# Patient Record
Sex: Male | Born: 1970
Health system: Southern US, Community
[De-identification: ages and names within clinical notes are randomized; demographics above are authoritative.]

## PROBLEM LIST (undated history)

## (undated) DIAGNOSIS — I639 Cerebral infarction, unspecified: Secondary | ICD-10-CM

## (undated) DIAGNOSIS — I10 Essential (primary) hypertension: Secondary | ICD-10-CM

## (undated) DIAGNOSIS — E119 Type 2 diabetes mellitus without complications: Secondary | ICD-10-CM

## (undated) DIAGNOSIS — N189 Chronic kidney disease, unspecified: Secondary | ICD-10-CM

## (undated) DIAGNOSIS — K219 Gastro-esophageal reflux disease without esophagitis: Secondary | ICD-10-CM

## (undated) DIAGNOSIS — D649 Anemia, unspecified: Secondary | ICD-10-CM

## (undated) DIAGNOSIS — G4730 Sleep apnea, unspecified: Secondary | ICD-10-CM

## (undated) HISTORY — DX: Sleep apnea, unspecified: G47.30

## (undated) HISTORY — DX: Anemia, unspecified: D64.9

## (undated) HISTORY — DX: Gastro-esophageal reflux disease without esophagitis: K21.9

## (undated) HISTORY — DX: Cerebral infarction, unspecified: I63.9

## (undated) HISTORY — DX: Chronic kidney disease, unspecified: N18.9

## (undated) NOTE — *Deleted (*Deleted)
Pharmacy Student Note: For Learning Purposes ONLY. Not an active part of the chart.   Chief Complaint:  Presented with worsening bilateral lower extremity swelling x 1 month, worsening.    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   Admit Complaint:  Anticoagulation -nonambulating (wheelchair bound) > Lovenox  30 mg Q24HR currently  Infectious Disease -WBC wnl  Cardiovascular Possible CHF -CXR notable for cardiomegaly and small bilateral pleural effusions > suggests CHF -ECHO 60-65%, LDL 599 (04/2020), BNP wnl -BNP, and ECHO make CHF less likely -repeat ECHO HTN -BP on admit 165/86 PTA: amlodipine 10 mg daily, carvedilol 12.5 mg BID, hydralazine 25 mg TID Tx: continue home meds Hx CVA 04/2020 Wheelchair bound (nonambulating) Tx: ctoniue PTA  Endocrinology: T2DM -BG 179 on admit -compliant to meds Last A1c 9.6 -PTA: glipizide 5 mg BID -Tx: sSSI, CBGs  Gastrointestinal / Nutrition: GERD -PTA omeprazole 40 mg daily -Tx: continue PTA   Neurology   Nephrology: nephrotic syndrome s/p CKD stage 4 and T2DM Nephrotic Syndrome Albumin low (2.0) > worsening today to 1.7 Protein present in urine (> 300) Thrombocytosis (plts 499) LDL, low albumin and protein lead to nephrotic syndrome Tx: sodium restrict (less than 2g/day), BMP, daily wts, strict I/O, IV lasix CKD Stage 4 Scr 3.42 (BL 3-3.1) > 3.49 (11/8) > 3.44 11/9 CrCl=25.4 GFR 21 BUN 31 > 34 UOP 4 L  BUN/SCr= 9 Tx: UA, diuresis, BMP, I/O    On lasix 120 mg over 60 minutes > according to nephro, responded well to      diuretics  Pulmonary   Hematology / Oncology: normocytic anemia Hgb 9.7 > 9.3 >8.8 MCV Plt 469 (high) > likely chronic  Fe 40 - ok TSAT 21 - borderline  but ok      PTA Medication Issues Senna-docusate not taking APAP 325 mg 2 tabs Q4HR mild pain Amlodipine 10 mg daily Aspirin 81 mg daily Atorvastatin 80 mg daily Carvedilol 12.5 mg BID Glipizide 5 mg BID Hydralazine 25 mg TID omperazole 40 mg daily PEG 17 g Sodium bicarbonate 650 mg 2 tabs daily VitD 50,000 unit Q7 days      Best Practices      Glean Salvo, Festus Aloe PharmD Student

---

## 2018-03-26 ENCOUNTER — Ambulatory Visit (INDEPENDENT_AMBULATORY_CARE_PROVIDER_SITE_OTHER): Payer: BLUE CROSS/BLUE SHIELD | Admitting: Emergency Medicine

## 2018-03-26 ENCOUNTER — Encounter: Payer: Self-pay | Admitting: Emergency Medicine

## 2018-03-26 ENCOUNTER — Other Ambulatory Visit: Payer: Self-pay

## 2018-03-26 VITALS — BP 122/82 | HR 93 | Temp 98.6°F | Resp 16 | Ht 64.25 in | Wt 149.6 lb

## 2018-03-26 DIAGNOSIS — R1010 Upper abdominal pain, unspecified: Secondary | ICD-10-CM | POA: Insufficient documentation

## 2018-03-26 NOTE — Patient Instructions (Addendum)
     If you have lab work done today you will be contacted with your lab results within the next 2 weeks.  If you have not heard from [place] then please contact us. The fastest way to get your results is to register for My Chart.   IF you received an x-ray today, you will receive an invoice from [facility]. Please contact [facility] at [phone] with questions or concerns regarding your invoice.   IF you received labwork today, you will receive an invoice from [facility]. Please contact [facility] at [phone] with questions or concerns regarding your invoice.   Our billing staff will not be able to assist you with questions regarding bills from these companies.  You will be contacted with the lab results as soon as they are available. The fastest way to get your results is to activate your My Chart account. Instructions are located on the last page of this paperwork. If you have not heard from [place] regarding the results in 2 weeks, please contact this office.     Abdominal Pain, Adult Many things can cause belly (abdominal) pain. Most times, belly pain is not dangerous. Many cases of belly pain can be watched and treated at home. Sometimes belly pain is serious, though. Your doctor will try to find the cause of your belly pain. Follow these instructions at home:  Take over-the-counter and prescription medicines only as told by your doctor. Do not take medicines that help you poop (laxatives) unless told to by your doctor.  Drink enough fluid to keep your pee (urine) clear or pale yellow.  Watch your belly pain for any changes.  Keep all follow-up visits as told by your doctor. This is important. Contact a doctor if:  Your belly pain changes or gets worse.  You are not hungry, or you lose weight without trying.  You are having trouble pooping (constipated) or have watery poop (diarrhea) for more than 2-3 days.  You have pain when you pee or poop.  Your belly pain  wakes you up at night.  Your pain gets worse with meals, after eating, or with certain foods.  You are throwing up and cannot keep anything down.  You have a fever. Get help right away if:  Your pain does not go away as soon as your doctor says it should.  You cannot stop throwing up.  Your pain is only in areas of your belly, such as the right side or the left lower part of the belly.  You have bloody or black poop, or poop that looks like tar.  You have very bad pain, cramping, or bloating in your belly.  You have signs of not having enough fluid or water in your body (dehydration), such as: ? Dark pee, very little pee, or no pee. ? Cracked lips. ? Dry mouth. ? Sunken eyes. ? Sleepiness. ? Weakness. This information is not intended to replace advice given to you by your health care provider. Make sure you discuss any questions you have with your health care provider. Document Released: 01/09/2008 Document Revised: 02/10/2016 Document Reviewed: 01/04/2016 Elsevier Interactive Patient Education  2018 [employer].

## 2018-03-26 NOTE — Progress Notes (Addendum)
Tony Schmitt 47 y.o.   Chief Complaint  Patient presents with  . Establish Care  . Abdominal Pain    x 3 days with vomiting    HISTORY OF PRESENT ILLNESS: This is a 47 y.o. male complaining of upper abdominal pain that started 3 days ago.  Doing better today.  Initially had some vomiting but none yesterday or today.  No longer nauseous.  Was able to eat and drink fine this morning.  Denies diarrhea.  Non-smoker.  Occasional beer drinker.  Had [place] pasta food with beer and soda last Saturday evening prior to developing symptoms Sunday morning.  Doing much better now.  No other associated symptoms.  Abdominal Pain  This is a new problem. The current episode started in the past 7 days. The onset quality is sudden. The problem has been gradually improving. The pain is located in the epigastric region. The pain is at a severity of 5/10. The pain is moderate. The quality of the pain is burning. The abdominal pain does not radiate. Associated symptoms include nausea and vomiting. Pertinent negatives include no anorexia, constipation, diarrhea, dysuria, fever, frequency, headaches, hematochezia, hematuria, melena, myalgias or weight loss. Nothing aggravates the pain. The pain is relieved by nothing. He has tried antacids for the symptoms. The treatment provided mild relief. There is no history of abdominal surgery, colon cancer, Crohn's disease, gallstones, GERD, pancreatitis, PUD or ulcerative colitis.     Prior to Admission medications   Not on File    Not on File  There are no active problems to display for this patient.   No past medical history on file.    Social History   Socioeconomic History  . Marital status: Single    Spouse name: Not on file  . Number of children: Not on file  . Years of education: Not on file  . Highest education level: Not on file  Occupational History  . Not on file  Social Needs  . Financial resource strain: Not on file  . Food insecurity:   Worry: Not on file    Inability: Not on file  . Transportation needs:    Medical: Not on file    Non-medical: Not on file  Tobacco Use  . Smoking status: Former Smoker    Types: Cigarettes, Cigars  . Smokeless tobacco: Never Used  Substance and Sexual Activity  . Alcohol use: Yes    Comment: beer 12 pack weekend  . Drug use: Never  . Sexual activity: Not on file  Lifestyle  . Physical activity:    Days per week: Not on file    Minutes per session: Not on file  . Stress: Not on file  Relationships  . Social connections:    Talks on phone: Not on file    Gets together: Not on file    Attends religious service: Not on file    Active member of club or organization: Not on file    Attends meetings of clubs or organizations: Not on file    Relationship status: Not on file  . Intimate partner violence:    Fear of current or ex partner: Not on file    Emotionally abused: Not on file    Physically abused: Not on file    Forced sexual activity: Not on file  Other Topics Concern  . Not on file  Social History Narrative  . Not on file    No family history on file.   Review of Systems  Constitutional: Negative.  Negative for chills, fever, malaise/fatigue and weight loss.  HENT: Negative.  Negative for sore throat.   Eyes: Negative.   Respiratory: Negative.  Negative for cough and shortness of breath.   Cardiovascular: Negative.  Negative for chest pain, palpitations and leg swelling.  Gastrointestinal: Positive for abdominal pain, nausea and vomiting. Negative for anorexia, constipation, diarrhea, heartburn, hematochezia and melena.  Genitourinary: Negative for dysuria, frequency and hematuria.  Musculoskeletal: Negative for myalgias.  Skin: Negative.  Negative for rash.  Neurological: Negative.  Negative for dizziness, focal weakness, loss of consciousness and headaches.  Endo/Heme/Allergies: Negative.   All other systems reviewed and are negative.   Vitals:   03/26/18  1034  BP: 122/82  Pulse: 93  Resp: 16  Temp: 98.6 F (37 C)  SpO2: 98%    Physical Exam  Constitutional: He is oriented to person, place, and time. He appears well-developed and well-nourished.  HENT:  Head: Normocephalic.  Right Ear: External ear normal.  Left Ear: External ear normal.  Nose: Nose normal.  Mouth/Throat: Oropharynx is clear and moist.  Eyes: Pupils are equal, round, and reactive to light. Conjunctivae and EOM are normal.  Neck: Normal range of motion. Neck supple. No thyromegaly present.  Cardiovascular: Normal rate, regular rhythm and normal heart sounds.  Pulmonary/Chest: Effort normal and breath sounds normal.  Abdominal: Soft. Bowel sounds are normal. He exhibits no distension and no mass. There is no tenderness. There is no rebound and no guarding. No hernia.  Musculoskeletal: Normal range of motion. He exhibits no edema.  Lymphadenopathy:    He has no cervical adenopathy.  Neurological: He is alert and oriented to person, place, and time. No sensory deficit. He exhibits normal muscle tone.  Skin: Skin is warm and dry. Capillary refill takes less than 2 seconds. No rash noted.  Psychiatric: He has a normal mood and affect. His behavior is normal.  Vitals reviewed.  Upper abdominal pain Clinically improved.  No red flag signs or symptoms.  Able to eat and drink.  No more vomiting.  No fever or chills.  Benign abdomen on examination.  Blood work done today.  Will monitor symptoms.  Follow-up as needed.    ASSESSMENT & PLAN: Tony Schmitt was seen today for establish care and abdominal pain.  Diagnoses and all orders for this visit:  Upper abdominal pain -     CBC with Differential/Platelet -     Comprehensive metabolic panel -     Lipase    Patient Instructions       If you have lab work done today you will be contacted with your lab results within the next 2 weeks.  If you have not heard from [place] then please contact us. The fastest way to get your  results is to register for My Chart.   IF you received an x-ray today, you will receive an invoice from [facility]. Please contact [facility] at [phone] with questions or concerns regarding your invoice.   IF you received labwork today, you will receive an invoice from [facility]. Please contact [facility] at [phone] with questions or concerns regarding your invoice.   Our billing staff will not be able to assist you with questions regarding bills from these companies.  You will be contacted with the lab results as soon as they are available. The fastest way to get your results is to activate your My Chart account. Instructions are located on the last page of this paperwork. If you have not  heard from [place] regarding the results in 2 weeks, please contact this office.     Abdominal Pain, Adult Many things can cause belly (abdominal) pain. Most times, belly pain is not dangerous. Many cases of belly pain can be watched and treated at home. Sometimes belly pain is serious, though. Your doctor will try to find the cause of your belly pain. Follow these instructions at home:  Take over-the-counter and prescription medicines only as told by your doctor. Do not take medicines that help you poop (laxatives) unless told to by your doctor.  Drink enough fluid to keep your pee (urine) clear or pale yellow.  Watch your belly pain for any changes.  Keep all follow-up visits as told by your doctor. This is important. Contact a doctor if:  Your belly pain changes or gets worse.  You are not hungry, or you lose weight without trying.  You are having trouble pooping (constipated) or have watery poop (diarrhea) for more than 2-3 days.  You have pain when you pee or poop.  Your belly pain wakes you up at night.  Your pain gets worse with meals, after eating, or with certain foods.  You are throwing up and cannot keep anything down.  You have a fever. Get help right away  if:  Your pain does not go away as soon as your doctor says it should.  You cannot stop throwing up.  Your pain is only in areas of your belly, such as the right side or the left lower part of the belly.  You have bloody or black poop, or poop that looks like tar.  You have very bad pain, cramping, or bloating in your belly.  You have signs of not having enough fluid or water in your body (dehydration), such as: ? Dark pee, very little pee, or no pee. ? Cracked lips. ? Dry mouth. ? Sunken eyes. ? Sleepiness. ? Weakness. This information is not intended to replace advice given to you by your health care provider. Make sure you discuss any questions you have with your health care provider. Document Released: 01/09/2008 Document Revised: 02/10/2016 Document Reviewed: 01/04/2016 Elsevier Interactive Patient Education  2018 [employer]      Agustina Caroli, MD [facility]

## 2018-03-26 NOTE — Assessment & Plan Note (Signed)
Clinically improved.  No red flag signs or symptoms.  Able to eat and drink.  No more vomiting.  No fever or chills.  Benign abdomen on examination.  Blood work done today.  Will monitor symptoms.  Follow-up as needed.

## 2018-03-27 ENCOUNTER — Ambulatory Visit (INDEPENDENT_AMBULATORY_CARE_PROVIDER_SITE_OTHER): Payer: BLUE CROSS/BLUE SHIELD | Admitting: Emergency Medicine

## 2018-03-27 ENCOUNTER — Telehealth: Payer: Self-pay | Admitting: Emergency Medicine

## 2018-03-27 ENCOUNTER — Encounter: Payer: Self-pay | Admitting: Emergency Medicine

## 2018-03-27 VITALS — BP 142/82 | HR 92 | Temp 98.5°F | Resp 16 | Wt 151.0 lb

## 2018-03-27 DIAGNOSIS — R739 Hyperglycemia, unspecified: Secondary | ICD-10-CM | POA: Diagnosis not present

## 2018-03-27 DIAGNOSIS — E119 Type 2 diabetes mellitus without complications: Secondary | ICD-10-CM

## 2018-03-27 LAB — CBC WITH DIFFERENTIAL/PLATELET
BASOS: 0 %
Basophils Absolute: 0 10*3/uL (ref 0.0–0.2)
EOS (ABSOLUTE): 0.1 10*3/uL (ref 0.0–0.4)
EOS: 1 %
HEMATOCRIT: 40.5 % (ref 37.5–51.0)
Hemoglobin: 12.6 g/dL — ABNORMAL LOW (ref 13.0–17.7)
Immature Grans (Abs): 0 10*3/uL (ref 0.0–0.1)
Immature Granulocytes: 0 %
LYMPHS ABS: 2.7 10*3/uL (ref 0.7–3.1)
Lymphs: 35 %
MCH: 27.7 pg (ref 26.6–33.0)
MCHC: 31.1 g/dL — AB (ref 31.5–35.7)
MCV: 89 fL (ref 79–97)
MONOS ABS: 0.4 10*3/uL (ref 0.1–0.9)
Monocytes: 6 %
Neutrophils Absolute: 4.3 10*3/uL (ref 1.4–7.0)
Neutrophils: 58 %
Platelets: 415 10*3/uL (ref 150–450)
RBC: 4.55 x10E6/uL (ref 4.14–5.80)
RDW: 13.9 % (ref 12.3–15.4)
WBC: 7.5 10*3/uL (ref 3.4–10.8)

## 2018-03-27 LAB — COMPREHENSIVE METABOLIC PANEL
A/G RATIO: 1.3 (ref 1.2–2.2)
ALK PHOS: 173 IU/L — AB (ref 39–117)
ALT: 32 IU/L (ref 0–44)
AST: 23 IU/L (ref 0–40)
Albumin: 3.9 g/dL (ref 3.5–5.5)
BILIRUBIN TOTAL: 0.3 mg/dL (ref 0.0–1.2)
BUN/Creatinine Ratio: 16 (ref 9–20)
BUN: 31 mg/dL — ABNORMAL HIGH (ref 6–24)
CO2: 24 mmol/L (ref 20–29)
Calcium: 10.6 mg/dL — ABNORMAL HIGH (ref 8.7–10.2)
Chloride: 91 mmol/L — ABNORMAL LOW (ref 96–106)
Creatinine, Ser: 1.94 mg/dL — ABNORMAL HIGH (ref 0.76–1.27)
GFR calc Af Amer: 46 mL/min/{1.73_m2} — ABNORMAL LOW (ref >59)
GFR calc non Af Amer: 40 mL/min/{1.73_m2} — ABNORMAL LOW (ref >59)
GLOBULIN, TOTAL: 3.1 g/dL (ref 1.5–4.5)
Glucose: 558 mg/dL (ref 65–99)
POTASSIUM: 4.6 mmol/L (ref 3.5–5.2)
Sodium: 130 mmol/L — ABNORMAL LOW (ref 134–144)
Total Protein: 7 g/dL (ref 6.0–8.5)

## 2018-03-27 LAB — POCT GLYCOSYLATED HEMOGLOBIN (HGB A1C): Hemoglobin A1C: 14 % — AB (ref 4.0–5.6)

## 2018-03-27 LAB — GLUCOSE, POCT (MANUAL RESULT ENTRY): POC Glucose: 375 mg/dl — AB (ref 70–99)

## 2018-03-27 LAB — LIPASE: LIPASE: 60 U/L (ref 13–78)

## 2018-03-27 MED ORDER — GLIPIZIDE 5 MG PO TABS: 5.0000 mg | ORAL_TABLET | Freq: Every day | ORAL | 3 refills | Status: DC

## 2018-03-27 MED ORDER — METFORMIN HCL 500 MG PO TABS: 500.0000 mg | ORAL_TABLET | Freq: Two times a day (BID) | ORAL | 3 refills | Status: DC

## 2018-03-27 NOTE — Progress Notes (Signed)
Results for orders placed or performed in visit on 03/26/18 (from the past 48 hour(s))  CBC with Differential/Platelet     Status: Abnormal   Collection Time: 03/26/18 11:04 AM  Result Value Ref Range   WBC 7.5 3.4 - 10.8 x10E3/uL   RBC 4.55 4.14 - 5.80 x10E6/uL   Hemoglobin 12.6 (L) 13.0 - 17.7 g/dL   Hematocrit 40.5 37.5 - 51.0 %   MCV 89 79 - 97 fL   MCH 27.7 26.6 - 33.0 pg   MCHC 31.1 (L) 31.5 - 35.7 g/dL   RDW 13.9 12.3 - 15.4 %   Platelets 415 150 - 450 x10E3/uL   Neutrophils 58 Not Estab. %   Lymphs 35 Not Estab. %   Monocytes 6 Not Estab. %   Eos 1 Not Estab. %   Basos 0 Not Estab. %   Neutrophils Absolute 4.3 1.4 - 7.0 x10E3/uL   Lymphocytes Absolute 2.7 0.7 - 3.1 x10E3/uL   Monocytes Absolute 0.4 0.1 - 0.9 x10E3/uL   EOS (ABSOLUTE) 0.1 0.0 - 0.4 x10E3/uL   Basophils Absolute 0.0 0.0 - 0.2 x10E3/uL   Immature Granulocytes 0 Not Estab. %   Immature Grans (Abs) 0.0 0.0 - 0.1 x10E3/uL  Comprehensive metabolic panel     Status: Abnormal   Collection Time: 03/26/18 11:04 AM  Result Value Ref Range   Glucose 558 (HH) 65 - 99 mg/dL    Comment: **Verified by repeat analysis**   BUN 31 (H) 6 - 24 mg/dL   Creatinine, Ser 1.94 (H) 0.76 - 1.27 mg/dL   GFR calc non Af Amer 40 (L) >59 mL/min/1.73   GFR calc Af Amer 46 (L) >59 mL/min/1.73   BUN/Creatinine Ratio 16 9 - 20   Sodium 130 (L) 134 - 144 mmol/L   Potassium 4.6 3.5 - 5.2 mmol/L   Chloride 91 (L) 96 - 106 mmol/L   CO2 24 20 - 29 mmol/L   Calcium 10.6 (H) 8.7 - 10.2 mg/dL   Total Protein 7.0 6.0 - 8.5 g/dL   Albumin 3.9 3.5 - 5.5 g/dL   Globulin, Total 3.1 1.5 - 4.5 g/dL   Albumin/Globulin Ratio 1.3 1.2 - 2.2   Bilirubin Total 0.3 0.0 - 1.2 mg/dL   Alkaline Phosphatase 173 (H) 39 - 117 IU/L   AST 23 0 - 40 IU/L   ALT 32 0 - 44 IU/L  Lipase     Status: None   Collection Time: 03/26/18 11:04 AM  Result Value Ref Range   Lipase 60 13 - 78 U/L   Tony Schmitt 47 y.o.   Chief Complaint  Patient presents with  .  Hyperglycemia    follow up and review lab work    HISTORY OF PRESENT ILLNESS: This is a 47 y.o. male seen by me yesterday with upper abdominal pain some nausea and some vomiting that started 3 days earlier.  Was feeling better yesterday.  Lab work showed hyperglycemia at 558.  Patient asymptomatic today.  No history of diabetes but both parents have diabetes.  HPI   Prior to Admission medications   Medication Sig Start Date End Date Taking? Authorizing Provider  Calcium Carbonate Antacid (TUMS PO) Take by mouth as needed.   Yes [provider]    No Known Allergies  Patient Active Problem List   Diagnosis Date Noted  . Upper abdominal pain 03/26/2018    No past medical history on file.  No past surgical history on file.  Social History  Socioeconomic History  . Marital status: Single    Spouse name: Not on file  . Number of children: Not on file  . Years of education: Not on file  . Highest education level: Not on file  Occupational History  . Not on file  Social Needs  . Financial resource strain: Not on file  . Food insecurity:    Worry: Not on file    Inability: Not on file  . Transportation needs:    Medical: Not on file    Non-medical: Not on file  Tobacco Use  . Smoking status: Former Smoker    Types: Cigarettes, Cigars  . Smokeless tobacco: Never Used  Substance and Sexual Activity  . Alcohol use: Yes    Comment: beer 12 pack weekend  . Drug use: Never  . Sexual activity: Not on file  Lifestyle  . Physical activity:    Days per week: Not on file    Minutes per session: Not on file  . Stress: Not on file  Relationships  . Social connections:    Talks on phone: Not on file    Gets together: Not on file    Attends religious service: Not on file    Active member of club or organization: Not on file    Attends meetings of clubs or organizations: Not on file    Relationship status: Not on file  . Intimate partner violence:    Fear of  current or ex partner: Not on file    Emotionally abused: Not on file    Physically abused: Not on file    Forced sexual activity: Not on file  Other Topics Concern  . Not on file  Social History Narrative  . Not on file    No family history on file.   Review of Systems  Constitutional: Negative.  Negative for chills, fever and weight loss.  HENT: Negative.  Negative for congestion, sinus pain and sore throat.   Eyes: Negative.  Negative for blurred vision and double vision.  Respiratory: Negative.  Negative for cough and shortness of breath.   Cardiovascular: Negative.  Negative for chest pain and palpitations.  Gastrointestinal: Negative.  Negative for abdominal pain, diarrhea, nausea and vomiting.  Genitourinary: Positive for frequency.  Musculoskeletal: Negative.  Negative for back pain, myalgias and neck pain.  Skin: Negative.  Negative for rash.  Neurological: Negative for dizziness and headaches.  Endo/Heme/Allergies: Negative for polydipsia.  All other systems reviewed and are negative.  Vitals:   03/27/18 1511  BP: (!) 142/82  Pulse: 92  Resp: 16  Temp: 98.5 F (36.9 C)  SpO2: 100%     Physical Exam  Constitutional: He is oriented to person, place, and time. He appears well-developed and well-nourished.  HENT:  Head: Normocephalic and atraumatic.  Nose: Nose normal.  Mouth/Throat: Oropharynx is clear and moist.  Eyes: Pupils are equal, round, and reactive to light. Conjunctivae and EOM are normal.  Neck: Normal range of motion. Neck supple. No thyromegaly present.  Cardiovascular: Normal rate, regular rhythm and normal heart sounds.  Pulmonary/Chest: Effort normal and breath sounds normal.  Abdominal: Soft. Bowel sounds are normal. He exhibits no distension. There is no tenderness.  Musculoskeletal: Normal range of motion.  Lymphadenopathy:    He has no cervical adenopathy.  Neurological: He is alert and oriented to person, place, and time. No sensory  deficit. He exhibits normal muscle tone.  Skin: Skin is warm and dry. Capillary refill takes less than 2 seconds.  Psychiatric: He has a normal mood and affect. His behavior is normal.  Vitals reviewed.  Results for orders placed or performed in visit on 03/27/18 (from the past 24 hour(s))  POCT glucose (manual entry)     Status: Abnormal   Collection Time: 03/27/18  3:17 PM  Result Value Ref Range   POC Glucose 375 (A) 70 - 99 mg/dl  POCT glycosylated hemoglobin (Hb A1C)     Status: Abnormal   Collection Time: 03/27/18  3:22 PM  Result Value Ref Range   Hemoglobin A1C 14.0 (A) 4.0 - 5.6 %   HbA1c POC (<> result, manual entry)     HbA1c, POC (prediabetic range)     HbA1c, POC (controlled diabetic range)      A total of 25 minutes was spent in the room with the patient, greater than 50% of which was in counseling/coordination of care regarding diagnosis and treatment of diabetes, nutrition, diet, medications, and need for follow-up in 4 weeks.  ASSESSMENT & PLAN: Rogen was seen today for hyperglycemia.  Diagnoses and all orders for this visit:  Hyperglycemia -     POCT glucose (manual entry) -     POCT glycosylated hemoglobin (Hb A1C) -     HIV antibody -     metFORMIN (GLUCOPHAGE) 500 MG tablet; Take 1 tablet (500 mg total) by mouth 2 (two) times daily with a meal. -     glipiZIDE (GLUCOTROL) 5 MG tablet; Take 1 tablet (5 mg total) by mouth daily before breakfast.  New onset type 2 diabetes mellitus (HCC) -     HIV antibody -     metFORMIN (GLUCOPHAGE) 500 MG tablet; Take 1 tablet (500 mg total) by mouth 2 (two) times daily with a meal. -     glipiZIDE (GLUCOTROL) 5 MG tablet; Take 1 tablet (5 mg total) by mouth daily before breakfast.    Patient Instructions       If you have lab work done today you will be contacted with your lab results within the next 2 weeks.  If you have not heard from [place] then please contact us. The fastest way to get your results is to register  for My Chart.   IF you received an x-ray today, you will receive an invoice from [facility]. Please contact [facility] at [phone] with questions or concerns regarding your invoice.   IF you received labwork today, you will receive an invoice from [place]. Please contact [facility] at [phone] with questions or concerns regarding your invoice.   Our billing staff will not be able to assist you with questions regarding bills from these companies.  You will be contacted with the lab results as soon as they are available. The fastest way to get your results is to activate your My Chart account. Instructions are located on the last page of this paperwork. If you have not heard from [place] regarding the results in 2 weeks, please contact this office.     Diabetes Mellitus and Nutrition When you have diabetes (diabetes mellitus), it is very important to have healthy eating habits because your blood sugar (glucose) levels are greatly affected by what you eat and drink. Eating healthy foods in the appropriate amounts, at about the same times every day, can help you:  Control your blood glucose.  Lower your risk of heart disease.  Improve your blood pressure.  Reach or maintain a healthy weight.  Every person with diabetes is different, and each person has different  needs for a meal plan. Your health care provider may recommend that you work with a diet and nutrition specialist (dietitian) to make a meal plan that is best for you. Your meal plan may vary depending on factors such as:  The calories you need.  The medicines you take.  Your weight.  Your blood glucose, blood pressure, and cholesterol levels.  Your activity level.  Other health conditions you have, such as heart or kidney disease.  How do carbohydrates affect me? Carbohydrates affect your blood glucose level more than any other type of food. Eating carbohydrates naturally increases the amount of  glucose in your blood. Carbohydrate counting is a method for keeping track of how many carbohydrates you eat. Counting carbohydrates is important to keep your blood glucose at a healthy level, especially if you use insulin or take certain oral diabetes medicines. It is important to know how many carbohydrates you can safely have in each meal. This is different for every person. Your dietitian can help you calculate how many carbohydrates you should have at each meal and for snack. Foods that contain carbohydrates include:  Bread, cereal, rice, pasta, and crackers.  Potatoes and corn.  Peas, beans, and lentils.  Milk and yogurt.  Fruit and juice.  Desserts, such as cakes, cookies, ice cream, and candy.  How does alcohol affect me? Alcohol can cause a sudden decrease in blood glucose (hypoglycemia), especially if you use insulin or take certain oral diabetes medicines. Hypoglycemia can be a life-threatening condition. Symptoms of hypoglycemia (sleepiness, dizziness, and confusion) are similar to symptoms of having too much alcohol. If your health care provider says that alcohol is safe for you, follow these guidelines:  Limit alcohol intake to no more than 1 drink per day for nonpregnant women and 2 drinks per day for men. One drink equals 12 oz of beer, 5 oz of wine, or 1 oz of hard liquor.  Do not drink on an empty stomach.  Keep yourself hydrated with water, diet soda, or unsweetened iced tea.  Keep in mind that regular soda, juice, and other mixers may contain a lot of sugar and must be counted as carbohydrates.  What are tips for following this plan? Reading food labels  Start by checking the serving size on the label. The amount of calories, carbohydrates, fats, and other nutrients listed on the label are based on one serving of the food. Many foods contain more than one serving per package.  Check the total grams (g) of carbohydrates in one serving. You can calculate the number  of servings of carbohydrates in one serving by dividing the total carbohydrates by 15. For example, if a food has 30 g of total carbohydrates, it would be equal to 2 servings of carbohydrates.  Check the number of grams (g) of saturated and trans fats in one serving. Choose foods that have low or no amount of these fats.  Check the number of milligrams (mg) of sodium in one serving. Most people should limit total sodium intake to less than 2,300 mg per day.  Always check the nutrition information of foods labeled as "low-fat" or "nonfat". These foods may be higher in added sugar or refined carbohydrates and should be avoided.  Talk to your dietitian to identify your daily goals for nutrients listed on the label. Shopping  Avoid buying canned, premade, or processed foods. These foods tend to be high in fat, sodium, and added sugar.  Shop around the outside edge of the grocery  store. This includes fresh fruits and vegetables, bulk grains, fresh meats, and fresh dairy. Cooking  Use low-heat cooking methods, such as baking, instead of high-heat cooking methods like deep frying.  Cook using healthy oils, such as olive, canola, or sunflower oil.  Avoid cooking with butter, cream, or high-fat meats. Meal planning  Eat meals and snacks regularly, preferably at the same times every day. Avoid going long periods of time without eating.  Eat foods high in fiber, such as fresh fruits, vegetables, beans, and whole grains. Talk to your dietitian about how many servings of carbohydrates you can eat at each meal.  Eat 4-6 ounces of lean protein each day, such as lean meat, chicken, fish, eggs, or tofu. 1 ounce is equal to 1 ounce of meat, chicken, or fish, 1 egg, or 1/4 cup of tofu.  Eat some foods each day that contain healthy fats, such as avocado, nuts, seeds, and fish. Lifestyle   Check your blood glucose regularly.  Exercise at least 30 minutes 5 or more days each week, or as told by your  health care provider.  Take medicines as told by your health care provider.  Do not use any products that contain nicotine or tobacco, such as cigarettes and e-cigarettes. If you need help quitting, ask your health care provider.  Work with a Social worker or diabetes educator to identify strategies to manage stress and any emotional and social challenges. What are some questions to ask my health care provider?  Do I need to meet with a diabetes educator?  Do I need to meet with a dietitian?  What number can I call if I have questions?  When are the best times to check my blood glucose? Where to find more information:  American Diabetes Association: diabetes.org/food-and-fitness/food  Academy of Nutrition and Dietetics: PokerClues.dk  [employer] of Diabetes and Digestive and Kidney Diseases (NIH): ContactWire.be Summary  A healthy meal plan will help you control your blood glucose and maintain a healthy lifestyle.  Working with a diet and nutrition specialist (dietitian) can help you make a meal plan that is best for you.  Keep in mind that carbohydrates and alcohol have immediate effects on your blood glucose levels. It is important to count carbohydrates and to use alcohol carefully. This information is not intended to replace advice given to you by your health care provider. Make sure you discuss any questions you have with your health care provider. Document Released: 04/19/2005 Document Revised: 08/27/2016 Document Reviewed: 08/27/2016 Elsevier Interactive Patient Education  2018 [employer].      Agustina Caroli, MD [facility]

## 2018-03-27 NOTE — Patient Instructions (Addendum)
   If you have lab work done today you will be contacted with your lab results within the next 2 weeks.  If you have not heard from us then please contact us. The fastest way to get your results is to register for My Chart.   IF you received an x-ray today, you will receive an invoice from [facility]. Please contact [facility] at [phone] with questions or concerns regarding your invoice.   IF you received labwork today, you will receive an invoice from [facility]. Please contact [facility] at [phone] with questions or concerns regarding your invoice.   Our billing staff will not be able to assist you with questions regarding bills from these companies.  You will be contacted with the lab results as soon as they are available. The fastest way to get your results is to activate your My Chart account. Instructions are located on the last page of this paperwork. If you have not heard from us regarding the results in 2 weeks, please contact this office.     Diabetes Mellitus and Nutrition When you have diabetes (diabetes mellitus), it is very important to have healthy eating habits because your blood sugar (glucose) levels are greatly affected by what you eat and drink. Eating healthy foods in the appropriate amounts, at about the same times every day, can help you:  Control your blood glucose.  Lower your risk of heart disease.  Improve your blood pressure.  Reach or maintain a healthy weight.  Every person with diabetes is different, and each person has different needs for a meal plan. Your health care provider may recommend that you work with a diet and nutrition specialist (dietitian) to make a meal plan that is best for you. Your meal plan may vary depending on factors such as:  The calories you need.  The medicines you take.  Your weight.  Your blood glucose, blood pressure, and cholesterol levels.  Your activity level.  Other health conditions you  have, such as heart or kidney disease.  How do carbohydrates affect me? Carbohydrates affect your blood glucose level more than any other type of food. Eating carbohydrates naturally increases the amount of glucose in your blood. Carbohydrate counting is a method for keeping track of how many carbohydrates you eat. Counting carbohydrates is important to keep your blood glucose at a healthy level, especially if you use insulin or take certain oral diabetes medicines. It is important to know how many carbohydrates you can safely have in each meal. This is different for every person. Your dietitian can help you calculate how many carbohydrates you should have at each meal and for snack. Foods that contain carbohydrates include:  Bread, cereal, rice, pasta, and crackers.  Potatoes and corn.  Peas, beans, and lentils.  Milk and yogurt.  Fruit and juice.  Desserts, such as cakes, cookies, ice cream, and candy.  How does alcohol affect me? Alcohol can cause a sudden decrease in blood glucose (hypoglycemia), especially if you use insulin or take certain oral diabetes medicines. Hypoglycemia can be a life-threatening condition. Symptoms of hypoglycemia (sleepiness, dizziness, and confusion) are similar to symptoms of having too much alcohol. If your health care provider says that alcohol is safe for you, follow these guidelines:  Limit alcohol intake to no more than 1 drink per day for nonpregnant women and 2 drinks per day for men. One drink equals 12 oz of beer, 5 oz of wine, or 1 oz of hard liquor.  Do   not drink on an empty stomach.  Keep yourself hydrated with water, diet soda, or unsweetened iced tea.  Keep in mind that regular soda, juice, and other mixers may contain a lot of sugar and must be counted as carbohydrates.  What are tips for following this plan? Reading food labels  Start by checking the serving size on the label. The amount of calories, carbohydrates, fats, and other  nutrients listed on the label are based on one serving of the food. Many foods contain more than one serving per package.  Check the total grams (g) of carbohydrates in one serving. You can calculate the number of servings of carbohydrates in one serving by dividing the total carbohydrates by 15. For example, if a food has 30 g of total carbohydrates, it would be equal to 2 servings of carbohydrates.  Check the number of grams (g) of saturated and trans fats in one serving. Choose foods that have low or no amount of these fats.  Check the number of milligrams (mg) of sodium in one serving. Most people should limit total sodium intake to less than 2,300 mg per day.  Always check the nutrition information of foods labeled as "low-fat" or "nonfat". These foods may be higher in added sugar or refined carbohydrates and should be avoided.  Talk to your dietitian to identify your daily goals for nutrients listed on the label. Shopping  Avoid buying canned, premade, or processed foods. These foods tend to be high in fat, sodium, and added sugar.  Shop around the outside edge of the grocery store. This includes fresh fruits and vegetables, bulk grains, fresh meats, and fresh dairy. Cooking  Use low-heat cooking methods, such as baking, instead of high-heat cooking methods like deep frying.  Cook using healthy oils, such as olive, canola, or sunflower oil.  Avoid cooking with butter, cream, or high-fat meats. Meal planning  Eat meals and snacks regularly, preferably at the same times every day. Avoid going long periods of time without eating.  Eat foods high in fiber, such as fresh fruits, vegetables, beans, and whole grains. Talk to your dietitian about how many servings of carbohydrates you can eat at each meal.  Eat 4-6 ounces of lean protein each day, such as lean meat, chicken, fish, eggs, or tofu. 1 ounce is equal to 1 ounce of meat, chicken, or fish, 1 egg, or 1/4 cup of tofu.  Eat some  foods each day that contain healthy fats, such as avocado, nuts, seeds, and fish. Lifestyle   Check your blood glucose regularly.  Exercise at least 30 minutes 5 or more days each week, or as told by your health care provider.  Take medicines as told by your health care provider.  Do not use any products that contain nicotine or tobacco, such as cigarettes and e-cigarettes. If you need help quitting, ask your health care provider.  Work with a counselor or diabetes educator to identify strategies to manage stress and any emotional and social challenges. What are some questions to ask my health care provider?  Do I need to meet with a diabetes educator?  Do I need to meet with a dietitian?  What number can I call if I have questions?  When are the best times to check my blood glucose? Where to find more information:  American Diabetes Association: diabetes.org/food-and-fitness/food  Academy of Nutrition and Dietetics: www.eatright.org/resources/health/diseases-and-conditions/diabetes  National Institute of Diabetes and Digestive and Kidney Diseases (NIH): www.niddk.nih.gov/health-information/diabetes/overview/diet-eating-physical-activity Summary  A healthy meal plan will   help you control your blood glucose and maintain a healthy lifestyle.  Working with a diet and nutrition specialist (dietitian) can help you make a meal plan that is best for you.  Keep in mind that carbohydrates and alcohol have immediate effects on your blood glucose levels. It is important to count carbohydrates and to use alcohol carefully. This information is not intended to replace advice given to you by your health care provider. Make sure you discuss any questions you have with your health care provider. Document Released: 04/19/2005 Document Revised: 08/27/2016 Document Reviewed: 08/27/2016 Elsevier Interactive Patient Education  2018 Elsevier Inc.  

## 2018-03-27 NOTE — Telephone Encounter (Signed)
03/27/2018 - PATIENT SAW DR. Kittie Plater ON Wednesday 03/26/2018. DR. Kittie Plater NEEDS HIM TO COME BACK INTO THE OFFICE TODAY SO THAT HE CAN GO OVER HIS LAB RESULTS WITH HIM. I LEFT A MESSAGE WITH HIS MOTHER (NOREEN Shives) WHO WILL TELL HIM TO CALL ME BACK WHEN HE GETS OFF OF WORK THIS MORNING. [place]

## 2018-03-28 LAB — HIV ANTIBODY (ROUTINE TESTING W REFLEX): HIV SCREEN 4TH GENERATION: NONREACTIVE

## 2018-04-24 ENCOUNTER — Ambulatory Visit: Payer: BLUE CROSS/BLUE SHIELD | Admitting: Emergency Medicine

## 2018-08-02 ENCOUNTER — Encounter (HOSPITAL_COMMUNITY): Payer: Self-pay

## 2018-08-02 ENCOUNTER — Emergency Department (HOSPITAL_COMMUNITY)
Admission: EM | Admit: 2018-08-02 | Discharge: 2018-08-03 | Disposition: A | Discharge status: Home / Self Care | Payer: Self-pay | Attending: Emergency Medicine | Admitting: Emergency Medicine

## 2018-08-02 ENCOUNTER — Emergency Department (HOSPITAL_COMMUNITY): Payer: Self-pay

## 2018-08-02 ENCOUNTER — Other Ambulatory Visit: Payer: Self-pay

## 2018-08-02 DIAGNOSIS — R112 Nausea with vomiting, unspecified: Secondary | ICD-10-CM | POA: Insufficient documentation

## 2018-08-02 DIAGNOSIS — R739 Hyperglycemia, unspecified: Secondary | ICD-10-CM

## 2018-08-02 DIAGNOSIS — Z7984 Long term (current) use of oral hypoglycemic drugs: Secondary | ICD-10-CM | POA: Insufficient documentation

## 2018-08-02 DIAGNOSIS — R1013 Epigastric pain: Secondary | ICD-10-CM | POA: Insufficient documentation

## 2018-08-02 DIAGNOSIS — E1165 Type 2 diabetes mellitus with hyperglycemia: Secondary | ICD-10-CM | POA: Insufficient documentation

## 2018-08-02 DIAGNOSIS — Z87891 Personal history of nicotine dependence: Secondary | ICD-10-CM | POA: Insufficient documentation

## 2018-08-02 HISTORY — DX: Type 2 diabetes mellitus without complications: E11.9

## 2018-08-02 LAB — CBG MONITORING, ED
Glucose-Capillary: 473 mg/dL — ABNORMAL HIGH (ref 70–99)
Glucose-Capillary: 443 mg/dL — ABNORMAL HIGH (ref 70–99)

## 2018-08-02 LAB — BASIC METABOLIC PANEL
Anion gap: 13 (ref 5–15)
BUN: 29 mg/dL — AB (ref 6–20)
CO2: 28 mmol/L (ref 22–32)
CREATININE: 2.42 mg/dL — AB (ref 0.61–1.24)
Calcium: 11.2 mg/dL — ABNORMAL HIGH (ref 8.9–10.3)
Chloride: 94 mmol/L — ABNORMAL LOW (ref 98–111)
GFR calc Af Amer: 35 mL/min — ABNORMAL LOW (ref >60)
GFR calc non Af Amer: 30 mL/min — ABNORMAL LOW (ref >60)
Glucose, Bld: 484 mg/dL — ABNORMAL HIGH (ref 70–99)
Potassium: 4.3 mmol/L (ref 3.5–5.1)
Sodium: 135 mmol/L (ref 135–145)

## 2018-08-02 LAB — CBC
HCT: 44.3 % (ref 39.0–52.0)
Hemoglobin: 14.3 g/dL (ref 13.0–17.0)
MCH: 26.8 pg (ref 26.0–34.0)
MCHC: 32.3 g/dL (ref 30.0–36.0)
MCV: 83.1 fL (ref 80.0–100.0)
Platelets: 444 10*3/uL — ABNORMAL HIGH (ref 150–400)
RBC: 5.33 MIL/uL (ref 4.22–5.81)
RDW: 12.9 % (ref 11.5–15.5)
WBC: 12.4 10*3/uL — ABNORMAL HIGH (ref 4.0–10.5)
nRBC: 0 % (ref 0.0–0.2)

## 2018-08-02 LAB — I-STAT TROPONIN, ED
Troponin i, poc: 0 ng/mL (ref 0.00–0.08)
Troponin i, poc: 0.01 ng/mL (ref 0.00–0.08)

## 2018-08-02 MED ORDER — SODIUM CHLORIDE 0.9 % IV BOLUS: 1000.0000 mL | Freq: Once | INTRAVENOUS | Status: DC

## 2018-08-02 MED ORDER — FAMOTIDINE IN NACL 20-0.9 MG/50ML-% IV SOLN
20.0000 mg | Freq: Once | INTRAVENOUS | Status: AC
  Administered 2018-08-02: 20 mg via INTRAVENOUS
  Filled 2018-08-02: qty 50

## 2018-08-02 MED ORDER — PANTOPRAZOLE SODIUM 40 MG IV SOLR
40.0000 mg | Freq: Once | INTRAVENOUS | Status: AC
  Administered 2018-08-02: 40 mg via INTRAVENOUS
  Filled 2018-08-02: qty 40

## 2018-08-02 MED ORDER — FAMOTIDINE 40 MG PO TABS: 40.0000 mg | ORAL_TABLET | Freq: Every day | ORAL | 0 refills | Status: DC

## 2018-08-02 MED ORDER — METOCLOPRAMIDE HCL 5 MG/ML IJ SOLN
10.0000 mg | Freq: Once | INTRAMUSCULAR | Status: AC
  Administered 2018-08-02: 10 mg via INTRAVENOUS
  Filled 2018-08-02: qty 2

## 2018-08-02 MED ORDER — SODIUM CHLORIDE 0.9 % IV BOLUS
1000.0000 mL | Freq: Once | INTRAVENOUS | Status: AC
  Administered 2018-08-02: 1000 mL via INTRAVENOUS

## 2018-08-02 MED ORDER — METFORMIN HCL 500 MG PO TABS: 500.0000 mg | ORAL_TABLET | Freq: Two times a day (BID) | ORAL | 0 refills | Status: DC

## 2018-08-02 MED ORDER — PANTOPRAZOLE SODIUM 20 MG PO TBEC: 20.0000 mg | DELAYED_RELEASE_TABLET | Freq: Every day | ORAL | 1 refills | Status: DC

## 2018-08-02 NOTE — ED Triage Notes (Signed)
Patient c/o emesis and abdominal pain. This AM. Patient has diabetes and does not take any medications for it. Significant other reports that the patient had a CBG-351 this AM and 452 at 1800 today. Patient also c/o mid chest pain since 1800 today. Patient denies any SOB or diaphoresis.

## 2018-08-02 NOTE — Discharge Instructions (Signed)
Read instructions below for reasons to return to the Emergency Department. It is recommended that your follow up with your Primary Care Doctor in regards to today's visit. I  Chest Pain (Nonspecific)  HOME CARE INSTRUCTIONS  For the next few days, avoid physical activities that bring on chest pain. Continue physical activities as directed.  Do not smoke cigarettes or drink alcohol until your symptoms are gone.  Only take over-the-counter or prescription medicine for pain, discomfort, or fever as directed by your caregiver.  Follow your caregiver's suggestions for further testing if your chest pain does not go away.  Keep any follow-up appointments you made. If you do not go to an appointment, you could develop lasting (chronic) problems with pain. If there is any problem keeping an appointment, you must call to reschedule.  SEEK MEDICAL CARE IF:  You think you are having problems from the medicine you are taking. Read your medicine instructions carefully.  Your chest pain does not go away, even after treatment.  You develop a rash with blisters on your chest.  SEEK IMMEDIATE MEDICAL CARE IF:  You have increased chest pain or pain that spreads to your arm, neck, jaw, back, or belly (abdomen).  You develop shortness of breath, an increasing cough, or you are coughing up blood.  You have severe back or abdominal pain, feel sick to your stomach (nauseous) or throw up (vomit).  You develop severe weakness, fainting, or chills.  You have an oral temperature above 102 F (38.9 C), not controlled by medicine.   THIS IS AN EMERGENCY. Do not wait to see if the pain will go away. Get medical help at once. Call your local emergency services (911 in [place] Do not drive yourself to the hospital.

## 2018-08-02 NOTE — ED Provider Notes (Signed)
[facility] DEPT Provider Note   CSN: [id number] Arrival date & time: 08/02/18  1813     History   Chief Complaint Chief Complaint  Patient presents with  . Chest Pain  . Emesis  . Abdominal Pain  . Hyperglycemia    HPI Tony Schmitt is a 47 y.o. male with a history of diabetes presenting to the ED with multiple medical complaints. He reports abdominal pain x 2 days, chest pain and hyperglycemia x 1 day.   Abdominal pain Pt reports 2 days ago he began feeling symptoms of acid reflux after drinking water. He has never been diagnosed with acid reflux before but has felt a similar symptoms before that goes away after drinking ginger ale. Today he reports he has had episodic acid reflux that is not improved with Tums. He admits to intermittent nausea and emesis. He has had 3 episodes of non-bilious emesis in the last 12 hours. Denies diarrhea, melena, constipation, history of abdominal surgeries. He occasionally drinks beer but has not had any since the beginning of December.  Chest pain Pt reports an episode chest pain 2 hours ago. He reports while he was laying down this evening he was having the symptoms of acid reflux for approximately 2 hours and then had the feeling of chest pressure lasting less than 1 minute. The pain did not radiate. The pain spontaneously resolved and has not reoccurred.He has not taken anything for the pain prior to arrival. Denies dyspnea on exertion, SOB, radiation to left/right arm, jaw or back, nausea, diaphoresis, palpitations.  Hyperglycemia Pt was diagnosed with diabetes by PCP in August. He has been noncompliant with medications and never started his Metformin. Today his sister checked his blood sugar and it was in the 400s. Admits to polyuria that has been present for months and is unchanged today. Denies blurry vision, visual changes, polyphagia, dysuria, polydipsia, decreased lower extremity sensation.    Past Medical  History:  Diagnosis Date  . Diabetes mellitus without complication [facility])     Patient Active Problem List   Diagnosis Date Noted  . Upper abdominal pain 03/26/2018    History reviewed. No pertinent surgical history.      Home Medications    Prior to Admission medications   Medication Sig Start Date End Date Taking? Authorizing Provider  glipiZIDE (GLUCOTROL) 5 MG tablet Take 1 tablet (5 mg total) by mouth daily before breakfast. Patient not taking: Reported on 08/02/2018 03/27/18   Horald Pollen, MD  metFORMIN (GLUCOPHAGE) 500 MG tablet Take 1 tablet (500 mg total) by mouth 2 (two) times daily with a meal. Patient not taking: Reported on 08/02/2018 03/27/18   Horald Pollen, MD    Family History Family History  Problem Relation Age of Onset  . Diabetes Mother   . Diabetes Father   . Hypertension Father   . Cancer Father   . Heart failure Other     Social History Social History   Tobacco Use  . Smoking status: Former Smoker    Types: Cigarettes, Cigars  . Smokeless tobacco: Never Used  Substance Use Topics  . Alcohol use: Not Currently  . Drug use: Never     Allergies   Patient has no known allergies.   Review of Systems Review of Systems  Constitutional: Negative for appetite change, chills, diaphoresis and unexpected weight change.  Eyes: Negative for visual disturbance.  Respiratory: Negative for cough, chest tightness and shortness of breath.   Cardiovascular: Positive for  chest pain. Negative for palpitations and leg swelling.  Gastrointestinal: Positive for abdominal pain, nausea and vomiting. Negative for abdominal distention, constipation and diarrhea.  Endocrine: Positive for polyuria. Negative for polydipsia and polyphagia.  Genitourinary: Negative for difficulty urinating, dysuria, flank pain, hematuria and urgency.  Skin: Negative for color change and wound.  Neurological: Negative for dizziness, syncope, weakness, numbness and  headaches.  Psychiatric/Behavioral: Negative.      Physical Exam Updated Vital Signs BP 134/89   Pulse 100   Temp 98.5 F (36.9 C) (Oral)   Resp 18   Ht 5\' 4"  (1.626 m)   Wt 59 kg   SpO2 95%   BMI 22.31 kg/m   Physical Exam Vitals signs and nursing note reviewed.  Constitutional:      General: He is not in acute distress.    Appearance: He is normal weight. He is not ill-appearing.  HENT:     Head: Normocephalic and atraumatic.  Eyes:     Extraocular Movements: Extraocular movements intact.     Pupils: Pupils are equal, round, and reactive to light.  Neck:     Musculoskeletal: Normal range of motion and neck supple.     Vascular: No JVD.     Trachea: No tracheal deviation.  Cardiovascular:     Rate and Rhythm: Normal rate and regular rhythm.     Heart sounds: Normal heart sounds. No murmur.  Pulmonary:     Effort: Pulmonary effort is normal.     Breath sounds: Normal breath sounds.  Chest:     Chest wall: No tenderness.  Abdominal:     General: Bowel sounds are normal.     Palpations: Abdomen is soft.     Tenderness: There is no abdominal tenderness. There is no guarding or rebound.  Musculoskeletal: Normal range of motion.     Right lower leg: No edema.     Left lower leg: No edema.  Skin:    General: Skin is warm and dry.     Capillary Refill: Capillary refill takes less than 2 seconds.  Neurological:     General: No focal deficit present.     Mental Status: He is alert and oriented to person, place, and time.  Psychiatric:        Mood and Affect: Mood normal.        Behavior: Behavior normal.      ED Treatments / Results  Labs (all labs ordered are listed, but only abnormal results are displayed) Labs Reviewed  CBC - Abnormal; Notable for the following components:      Result Value   WBC 12.4 (*)    Platelets 444 (*)    All other components within normal limits  CBG MONITORING, ED - Abnormal; Notable for the following components:    Glucose-Capillary 473 (*)    All other components within normal limits  BASIC METABOLIC PANEL  I-STAT TROPONIN, ED    EKG EKG Interpretation  Date/Time:  Saturday August 02 2018 18:33:26 EST Ventricular Rate:  113 PR Interval:    QRS Duration: 87 QT Interval:  324 QTC Calculation: 445 R Axis:   11 Text Interpretation:  Sinus tachycardia Biatrial enlargement Abnormal R-wave progression, early transition Confirmed by Virgel Manifold [id number]) on 08/02/2018 6:51:12 PM   Radiology No results found.  Procedures Procedures (including critical care time)  Medications Ordered in ED Medications - No data to display   Initial Impression / Assessment and Plan / ED Course  I have reviewed the triage  vital signs and the nursing notes.  Pertinent labs & imaging results that were available during my care of the patient were reviewed by me and considered in my medical decision making (see chart for details).    Clinical Course as of Aug 03 2351  Sat Aug 02, 2018  2107 Reassed pt's chest pain, abdominal pain, and nausea. He reports he is pain free after medications   [KA]  2219 Glucose-Capillary(!): 443 [KA]  2239 Pt given water for PO challenge. Will recheck    [KA]  2337 Clarise Cruz RN reports pt tolerating PO challenge.    [KA]    Clinical Course User Index [KA] Laquasha Groome, Harley Hallmark, PA-C  Pt is a 47 year old male with a history of diabetes presenting with multiple medical complaints. He has had 2 days of reflux symptoms and an episode of chest pain associated with the reflux. Chest pain is not likely of cardiac or pulmonary etiology d/t presentation, HEART score of 2, VSS, no tracheal deviation, no JVD or new murmur, RRR, breath sounds equal bilaterally, EKG without acute abnormalities, negative troponin x2, and negative CXR.    His abdominal pain ddx includes gastritis with or without diabetes and GERD. Pt has symptom relief after Pepcid and Protonix. Pt tolerating PO fluids. Repeat  abdominal exam is negative for pain. Will discharge with scripts for Pepcid and Protonix.  Pt has been advised to return to the ED if CP becomes exertional, associated with diaphoresis or nausea, radiates to left jaw/arm, worsens or becomes concerning in any way.   Pt glucose on arrival is 473. DKA was considered but is less likely as he does not have an elevated anion gap. After 2 bolus of NS his repeat glucose was 443. Also advised to follow up with PCP for his diabetes. He lost his previous script for Metformin so I will give him one at discharge so he can begin to better control his glucose until pcp follow-up.   Elevated blood pressure was noted later during his stay in the absence of chest pain and signs of hypertensive emergency. Pt does not have prior diagnosis of hypertension and is advised to follow up with his pcp for re-check. Pt appears reliable for follow up and is agreeable to discharge.    Final Clinical Impressions(s) / ED Diagnoses   Final diagnoses:  None    ED Discharge Orders    None       Cherre Robins, PA-C 08/03/18 0001    Little, Wenda Overland, MD 08/03/18 1652

## 2018-08-02 NOTE — ED Notes (Signed)
ED Provider at bedside. 

## 2018-08-29 ENCOUNTER — Other Ambulatory Visit: Payer: Self-pay

## 2018-08-29 ENCOUNTER — Ambulatory Visit (INDEPENDENT_AMBULATORY_CARE_PROVIDER_SITE_OTHER): Payer: Self-pay | Admitting: Emergency Medicine

## 2018-08-29 ENCOUNTER — Encounter: Payer: Self-pay | Admitting: Emergency Medicine

## 2018-08-29 VITALS — BP 150/98 | HR 91 | Temp 98.5°F | Resp 16 | Ht 64.0 in | Wt 159.4 lb

## 2018-08-29 DIAGNOSIS — E1159 Type 2 diabetes mellitus with other circulatory complications: Secondary | ICD-10-CM | POA: Insufficient documentation

## 2018-08-29 DIAGNOSIS — I10 Essential (primary) hypertension: Secondary | ICD-10-CM

## 2018-08-29 DIAGNOSIS — E1169 Type 2 diabetes mellitus with other specified complication: Secondary | ICD-10-CM | POA: Insufficient documentation

## 2018-08-29 DIAGNOSIS — R739 Hyperglycemia, unspecified: Secondary | ICD-10-CM

## 2018-08-29 DIAGNOSIS — E1165 Type 2 diabetes mellitus with hyperglycemia: Secondary | ICD-10-CM

## 2018-08-29 LAB — CMP14+EGFR
ALBUMIN: 3.6 g/dL — AB (ref 4.0–5.0)
ALT: 33 IU/L (ref 0–44)
AST: 18 IU/L (ref 0–40)
Albumin/Globulin Ratio: 1.5 (ref 1.2–2.2)
Alkaline Phosphatase: 171 IU/L — ABNORMAL HIGH (ref 39–117)
BUN/Creatinine Ratio: 11 (ref 9–20)
BUN: 13 mg/dL (ref 6–24)
Bilirubin Total: 0.3 mg/dL (ref 0.0–1.2)
CO2: 22 mmol/L (ref 20–29)
Calcium: 10.1 mg/dL (ref 8.7–10.2)
Chloride: 98 mmol/L (ref 96–106)
Creatinine, Ser: 1.23 mg/dL (ref 0.76–1.27)
GFR calc Af Amer: 80 mL/min/{1.73_m2} (ref >59)
GFR calc non Af Amer: 69 mL/min/{1.73_m2} (ref >59)
GLUCOSE: 318 mg/dL — AB (ref 65–99)
Globulin, Total: 2.4 g/dL (ref 1.5–4.5)
Potassium: 3.9 mmol/L (ref 3.5–5.2)
Sodium: 137 mmol/L (ref 134–144)
Total Protein: 6 g/dL (ref 6.0–8.5)

## 2018-08-29 LAB — LIPID PANEL
Chol/HDL Ratio: 10 ratio — ABNORMAL HIGH (ref 0.0–5.0)
Cholesterol, Total: 400 mg/dL — ABNORMAL HIGH (ref 100–199)
HDL: 40 mg/dL (ref >39)
LDL Calculated: 281 mg/dL — ABNORMAL HIGH (ref 0–99)
Triglycerides: 396 mg/dL — ABNORMAL HIGH (ref 0–149)
VLDL Cholesterol Cal: 79 mg/dL — ABNORMAL HIGH (ref 5–40)

## 2018-08-29 LAB — POCT GLYCOSYLATED HEMOGLOBIN (HGB A1C): Hemoglobin A1C: 14 % — AB (ref 4.0–5.6)

## 2018-08-29 LAB — GLUCOSE, POCT (MANUAL RESULT ENTRY): POC Glucose: 298 mg/dl — AB (ref 70–99)

## 2018-08-29 MED ORDER — LISINOPRIL 20 MG PO TABS: 20.0000 mg | ORAL_TABLET | Freq: Every day | ORAL | 3 refills | Status: DC

## 2018-08-29 MED ORDER — GLIPIZIDE 5 MG PO TABS: 5.0000 mg | ORAL_TABLET | Freq: Two times a day (BID) | ORAL | 1 refills | Status: DC

## 2018-08-29 MED ORDER — METFORMIN HCL 1000 MG PO TABS: 1000.0000 mg | ORAL_TABLET | Freq: Two times a day (BID) | ORAL | 3 refills | Status: DC

## 2018-08-29 MED ORDER — ROSUVASTATIN CALCIUM 10 MG PO TABS: 10.0000 mg | ORAL_TABLET | Freq: Every day | ORAL | 3 refills | Status: DC

## 2018-08-29 NOTE — Patient Instructions (Addendum)
If you have lab work done today you will be contacted with your lab results within the next 2 weeks.  If you have not heard from [place] then please contact us. The fastest way to get your results is to register for My Chart.   IF you received an x-ray today, you will receive an invoice from [facility]. Please contact [facility] at [phone] with questions or concerns regarding your invoice.   IF you received labwork today, you will receive an invoice from [facility]. Please contact [facility] at [phone] with questions or concerns regarding your invoice.   Our billing staff will not be able to assist you with questions regarding bills from these companies.  You will be contacted with the lab results as soon as they are available. The fastest way to get your results is to activate your My Chart account. Instructions are located on the last page of this paperwork. If you have not heard from [place] regarding the results in 2 weeks, please contact this office.      Diabetes Mellitus and Nutrition, Adult When you have diabetes (diabetes mellitus), it is very important to have healthy eating habits because your blood sugar (glucose) levels are greatly affected by what you eat and drink. Eating healthy foods in the appropriate amounts, at about the same times every day, can help you:  Control your blood glucose.  Lower your risk of heart disease.  Improve your blood pressure.  Reach or maintain a healthy weight. Every person with diabetes is different, and each person has different needs for a meal plan. Your health care provider may recommend that you work with a diet and nutrition specialist (dietitian) to make a meal plan that is best for you. Your meal plan may vary depending on factors such as:  The calories you need.  The medicines you take.  Your weight.  Your blood glucose, blood pressure, and cholesterol levels.  Your activity level.  Other health  conditions you have, such as heart or kidney disease. How do carbohydrates affect me? Carbohydrates, also called carbs, affect your blood glucose level more than any other type of food. Eating carbs naturally raises the amount of glucose in your blood. Carb counting is a method for keeping track of how many carbs you eat. Counting carbs is important to keep your blood glucose at a healthy level, especially if you use insulin or take certain oral diabetes medicines. It is important to know how many carbs you can safely have in each meal. This is different for every person. Your dietitian can help you calculate how many carbs you should have at each meal and for each snack. Foods that contain carbs include:  Bread, cereal, rice, pasta, and crackers.  Potatoes and corn.  Peas, beans, and lentils.  Milk and yogurt.  Fruit and juice.  Desserts, such as cakes, cookies, ice cream, and candy. How does alcohol affect me? Alcohol can cause a sudden decrease in blood glucose (hypoglycemia), especially if you use insulin or take certain oral diabetes medicines. Hypoglycemia can be a life-threatening condition. Symptoms of hypoglycemia (sleepiness, dizziness, and confusion) are similar to symptoms of having too much alcohol. If your health care provider says that alcohol is safe for you, follow these guidelines:  Limit alcohol intake to no more than 1 drink per day for nonpregnant women and 2 drinks per day for men. One drink equals 12 oz of beer, 5 oz of wine, or 1 oz of hard  liquor.  Do not drink on an empty stomach.  Keep yourself hydrated with water, diet soda, or unsweetened iced tea.  Keep in mind that regular soda, juice, and other mixers may contain a lot of sugar and must be counted as carbs. What are tips for following this plan?  Reading food labels  Start by checking the serving size on the "Nutrition Facts" label of packaged foods and drinks. The amount of calories, carbs, fats, and  other nutrients listed on the label is based on one serving of the item. Many items contain more than one serving per package.  Check the total grams (g) of carbs in one serving. You can calculate the number of servings of carbs in one serving by dividing the total carbs by 15. For example, if a food has 30 g of total carbs, it would be equal to 2 servings of carbs.  Check the number of grams (g) of saturated and trans fats in one serving. Choose foods that have low or no amount of these fats.  Check the number of milligrams (mg) of salt (sodium) in one serving. Most people should limit total sodium intake to less than 2,300 mg per day.  Always check the nutrition information of foods labeled as "low-fat" or "nonfat". These foods may be higher in added sugar or refined carbs and should be avoided.  Talk to your dietitian to identify your daily goals for nutrients listed on the label. Shopping  Avoid buying canned, premade, or processed foods. These foods tend to be high in fat, sodium, and added sugar.  Shop around the outside edge of the grocery store. This includes fresh fruits and vegetables, bulk grains, fresh meats, and fresh dairy. Cooking  Use low-heat cooking methods, such as baking, instead of high-heat cooking methods like deep frying.  Cook using healthy oils, such as olive, canola, or sunflower oil.  Avoid cooking with butter, cream, or high-fat meats. Meal planning  Eat meals and snacks regularly, preferably at the same times every day. Avoid going long periods of time without eating.  Eat foods high in fiber, such as fresh fruits, vegetables, beans, and whole grains. Talk to your dietitian about how many servings of carbs you can eat at each meal.  Eat 4-6 ounces (oz) of lean protein each day, such as lean meat, chicken, fish, eggs, or tofu. One oz of lean protein is equal to: ? 1 oz of meat, chicken, or fish. ? 1 egg. ?  cup of tofu.  Eat some foods each day that  contain healthy fats, such as avocado, nuts, seeds, and fish. Lifestyle  Check your blood glucose regularly.  Exercise regularly as told by your health care provider. This may include: ? 150 minutes of moderate-intensity or vigorous-intensity exercise each week. This could be brisk walking, biking, or water aerobics. ? Stretching and doing strength exercises, such as yoga or weightlifting, at least 2 times a week.  Take medicines as told by your health care provider.  Do not use any products that contain nicotine or tobacco, such as cigarettes and e-cigarettes. If you need help quitting, ask your health care provider.  Work with a Social worker or diabetes educator to identify strategies to manage stress and any emotional and social challenges. Questions to ask a health care provider  Do I need to meet with a diabetes educator?  Do I need to meet with a dietitian?  What number can I call if I have questions?  When are the best  times to check my blood glucose? Where to find more information:  American Diabetes Association: diabetes.org  Academy of Nutrition and Dietetics: www.eatright.[employer] of Diabetes and Digestive and Kidney Diseases (NIH): [employer] Summary  A healthy meal plan will help you control your blood glucose and maintain a healthy lifestyle.  Working with a diet and nutrition specialist (dietitian) can help you make a meal plan that is best for you.  Keep in mind that carbohydrates (carbs) and alcohol have immediate effects on your blood glucose levels. It is important to count carbs and to use alcohol carefully. This information is not intended to replace advice given to you by your health care provider. Make sure you discuss any questions you have with your health care provider. Document Released: 04/19/2005 Document Revised: 02/20/2017 Document Reviewed: 08/27/2016 Elsevier Interactive Patient Education  2019 [employer].  Hypertension Hypertension, commonly called high blood pressure, is when the force of blood pumping through the arteries is too strong. The arteries are the blood vessels that carry blood from the heart throughout the body. Hypertension forces the heart to work harder to pump blood and may cause arteries to become narrow or stiff. Having untreated or uncontrolled hypertension can cause heart attacks, strokes, kidney disease, and other problems. A blood pressure reading consists of a higher number over a lower number. Ideally, your blood pressure should be below 120/80. The first ("top") number is called the systolic pressure. It is a measure of the pressure in your arteries as your heart beats. The second ("bottom") number is called the diastolic pressure. It is a measure of the pressure in your arteries as the heart relaxes. What are the causes? The cause of this condition is not known. What increases the risk? Some risk factors for high blood pressure are under your control. Others are not. Factors you can change  Smoking.  Having type 2 diabetes mellitus, high cholesterol, or both.  Not getting enough exercise or physical activity.  Being overweight.  Having too much fat, sugar, calories, or salt (sodium) in your diet.  Drinking too much alcohol. Factors that are difficult or impossible to change  Having chronic kidney disease.  Having a family history of high blood pressure.  Age. Risk increases with age.  Race. You may be at higher risk if you are African-American.  Gender. Men are at higher risk than women before age 24. After age 50, women are at higher risk than men.  Having obstructive sleep apnea.  Stress. What are the signs or symptoms? Extremely high blood pressure (hypertensive crisis) may cause:  Headache.  Anxiety.  Shortness of breath.  Nosebleed.  Nausea and vomiting.  Severe chest pain.  Jerky movements you cannot control (seizures). How is  this diagnosed? This condition is diagnosed by measuring your blood pressure while you are seated, with your arm resting on a surface. The cuff of the blood pressure monitor will be placed directly against the skin of your upper arm at the level of your heart. It should be measured at least twice using the same arm. Certain conditions can cause a difference in blood pressure between your right and left arms. Certain factors can cause blood pressure readings to be lower or higher than normal (elevated) for a short period of time:  When your blood pressure is higher when you are in a health care provider's office than when you are at home, this is called white coat hypertension. Most people with this condition do not  need medicines.  When your blood pressure is higher at home than when you are in a health care provider's office, this is called masked hypertension. Most people with this condition may need medicines to control blood pressure. If you have a high blood pressure reading during one visit or you have normal blood pressure with other risk factors:  You may be asked to return on a different day to have your blood pressure checked again.  You may be asked to monitor your blood pressure at home for 1 week or longer. If you are diagnosed with hypertension, you may have other blood or imaging tests to help your health care provider understand your overall risk for other conditions. How is this treated? This condition is treated by making healthy lifestyle changes, such as eating healthy foods, exercising more, and reducing your alcohol intake. Your health care provider may prescribe medicine if lifestyle changes are not enough to get your blood pressure under control, and if:  Your systolic blood pressure is above 130.  Your diastolic blood pressure is above 80. Your personal target blood pressure may vary depending on your medical conditions, your age, and other factors. Follow these  instructions at home: Eating and drinking   Eat a diet that is high in fiber and potassium, and low in sodium, added sugar, and fat. An example eating plan is called the DASH (Dietary Approaches to Stop Hypertension) diet. To eat this way: ? Eat plenty of fresh fruits and vegetables. Try to fill half of your plate at each meal with fruits and vegetables. ? Eat whole grains, such as whole wheat pasta, brown rice, or whole grain bread. Fill about one quarter of your plate with whole grains. ? Eat or drink low-fat dairy products, such as skim milk or low-fat yogurt. ? Avoid fatty cuts of meat, processed or cured meats, and poultry with skin. Fill about one quarter of your plate with lean proteins, such as fish, chicken without skin, beans, eggs, and tofu. ? Avoid premade and processed foods. These tend to be higher in sodium, added sugar, and fat.  Reduce your daily sodium intake. Most people with hypertension should eat less than 1,500 mg of sodium a day.  Limit alcohol intake to no more than 1 drink a day for nonpregnant women and 2 drinks a day for men. One drink equals 12 oz of beer, 5 oz of wine, or 1 oz of hard liquor. Lifestyle   Work with your health care provider to maintain a healthy body weight or to lose weight. Ask what an ideal weight is for you.  Get at least 30 minutes of exercise that causes your heart to beat faster (aerobic exercise) most days of the week. Activities may include walking, swimming, or biking.  Include exercise to strengthen your muscles (resistance exercise), such as pilates or lifting weights, as part of your weekly exercise routine. Try to do these types of exercises for 30 minutes at least 3 days a week.  Do not use any products that contain nicotine or tobacco, such as cigarettes and e-cigarettes. If you need help quitting, ask your health care provider.  Monitor your blood pressure at home as told by your health care provider.  Keep all follow-up  visits as told by your health care provider. This is important. Medicines  Take over-the-counter and prescription medicines only as told by your health care provider. Follow directions carefully. Blood pressure medicines must be taken as prescribed.  Do not skip doses  of blood pressure medicine. Doing this puts you at risk for problems and can make the medicine less effective.  Ask your health care provider about side effects or reactions to medicines that you should watch for. Contact a health care provider if:  You think you are having a reaction to a medicine you are taking.  You have headaches that keep coming back (recurring).  You feel dizzy.  You have swelling in your ankles.  You have trouble with your vision. Get help right away if:  You develop a severe headache or confusion.  You have unusual weakness or numbness.  You feel faint.  You have severe pain in your chest or abdomen.  You vomit repeatedly.  You have trouble breathing. Summary  Hypertension is when the force of blood pumping through your arteries is too strong. If this condition is not controlled, it may put you at risk for serious complications.  Your personal target blood pressure may vary depending on your medical conditions, your age, and other factors. For most people, a normal blood pressure is less than 120/80.  Hypertension is treated with lifestyle changes, medicines, or a combination of both. Lifestyle changes include weight loss, eating a healthy, low-sodium diet, exercising more, and limiting alcohol. This information is not intended to replace advice given to you by your health care provider. Make sure you discuss any questions you have with your health care provider. Document Released: 07/23/2005 Document Revised: 06/20/2016 Document Reviewed: 06/20/2016 Elsevier Interactive Patient Education  2019 [employer].

## 2018-08-29 NOTE — Assessment & Plan Note (Signed)
Uncontrolled hypertension.  We will start lisinopril 20 mg once a day.  Follow-up in 4 weeks.

## 2018-08-29 NOTE — Progress Notes (Signed)
Lab Results  Component Value Date   HGBA1C 14.0 (A) 03/27/2018   BP Readings from Last 3 Encounters:  08/29/18 (!) 179/105  08/02/18 (!) 177/93  03/27/18 (!) 142/82   Micah Flesher Wiatrek 48 y.o.   Chief Complaint  Patient presents with  . Diabetes    follow up to have  blood sugar rechecked, last checked 1 month ago  . Hypertension    follow up for elevated blood pressure    HISTORY OF PRESENT ILLNESS: This is a 48 y.o. male here for follow-up of diabetes.  Was seen in emergency room on 08/02/2018 complaining of chest pain.  Found to have hyperglycemia with blood glucose over 400.  No DKA.  Found to be hypertensive.  Asymptomatic today.  States he is compliant with medications taking metformin 500 mg twice a day and glipizide 5 mg in the morning.  No blood pressure medication.  Not on a statin.  HPI   Prior to Admission medications   Medication Sig Start Date End Date Taking? Authorizing Provider  glipiZIDE (GLUCOTROL) 5 MG tablet Take 1 tablet (5 mg total) by mouth daily before breakfast. 03/27/18  Yes Tasfia Vasseur, Ines Bloomer, MD  metFORMIN (GLUCOPHAGE) 500 MG tablet Take 1 tablet (500 mg total) by mouth 2 (two) times daily with a meal. 03/27/18  Yes Tayler Lassen, Ines Bloomer, MD  metFORMIN (GLUCOPHAGE) 500 MG tablet Take 1 tablet (500 mg total) by mouth 2 (two) times daily. 08/02/18 09/01/18 Yes Albrizze, Harley Hallmark, PA-C    No Known Allergies  Patient Active Problem List   Diagnosis Date Noted  . Upper abdominal pain 03/26/2018    Past Medical History:  Diagnosis Date  . Diabetes mellitus without complication ([place])     No past surgical history on file.  Social History   Socioeconomic History  . Marital status: Single    Spouse name: Not on file  . Number of children: Not on file  . Years of education: Not on file  . Highest education level: Not on file  Occupational History  . Not on file  Social Needs  . Financial resource strain: Not on file  . Food insecurity:   Worry: Not on file    Inability: Not on file  . Transportation needs:    Medical: Not on file    Non-medical: Not on file  Tobacco Use  . Smoking status: Former Smoker    Types: Cigarettes, Cigars  . Smokeless tobacco: Never Used  Substance and Sexual Activity  . Alcohol use: Not Currently  . Drug use: Never  . Sexual activity: Not on file  Lifestyle  . Physical activity:    Days per week: Not on file    Minutes per session: Not on file  . Stress: Not on file  Relationships  . Social connections:    Talks on phone: Not on file    Gets together: Not on file    Attends religious service: Not on file    Active member of club or organization: Not on file    Attends meetings of clubs or organizations: Not on file    Relationship status: Not on file  . Intimate partner violence:    Fear of current or ex partner: Not on file    Emotionally abused: Not on file    Physically abused: Not on file    Forced sexual activity: Not on file  Other Topics Concern  . Not on file  Social History Narrative  . Not on file  Family History  Problem Relation Age of Onset  . Diabetes Mother   . Diabetes Father   . Hypertension Father   . Cancer Father   . Heart failure Other      Review of Systems  Constitutional: Negative.  Negative for chills and fever.  HENT: Negative.  Negative for hearing loss.   Eyes: Negative.  Negative for blurred vision and double vision.       Uses reading glasses  Respiratory: Negative.  Negative for cough and shortness of breath.   Cardiovascular: Negative.  Negative for chest pain, palpitations and leg swelling.  Gastrointestinal: Negative.  Negative for abdominal pain, blood in stool, diarrhea, melena, nausea and vomiting.  Genitourinary: Negative.  Negative for dysuria and hematuria.  Musculoskeletal: Negative.  Negative for back pain, myalgias and neck pain.  Skin: Negative.  Negative for rash.  Neurological: Negative.  Negative for dizziness and  headaches.  Endo/Heme/Allergies: Negative.   All other systems reviewed and are negative.   Vitals:   08/29/18 0923 08/29/18 0930  BP: (!) 179/105 (!) 150/98  Pulse: 91   Resp: 16   Temp: 98.5 F (36.9 C)   SpO2: 99%     Physical Exam Vitals signs reviewed.  Constitutional:      Appearance: Normal appearance.  HENT:     Head: Normocephalic and atraumatic.     Nose: Nose normal.     Mouth/Throat:     Mouth: Mucous membranes are moist.     Pharynx: Oropharynx is clear.  Eyes:     Extraocular Movements: Extraocular movements intact.     Conjunctiva/sclera: Conjunctivae normal.     Pupils: Pupils are equal, round, and reactive to light.  Neck:     Musculoskeletal: Normal range of motion and neck supple.  Cardiovascular:     Rate and Rhythm: Normal rate and regular rhythm.     Heart sounds: Normal heart sounds.  Pulmonary:     Effort: Pulmonary effort is normal.     Breath sounds: Normal breath sounds.  Abdominal:     General: There is no distension.     Palpations: Abdomen is soft.     Tenderness: There is no abdominal tenderness.  Musculoskeletal: Normal range of motion.  Skin:    General: Skin is warm and dry.     Capillary Refill: Capillary refill takes less than 2 seconds.  Neurological:     General: No focal deficit present.     Mental Status: He is alert and oriented to person, place, and time.  Psychiatric:        Mood and Affect: Mood normal.        Behavior: Behavior normal.    Results for orders placed or performed in visit on 08/29/18 (from the past 24 hour(s))  POCT glucose (manual entry)     Status: Abnormal   Collection Time: 08/29/18  9:46 AM  Result Value Ref Range   POC Glucose 298 (A) 70 - 99 mg/dl  POCT glycosylated hemoglobin (Hb A1C)     Status: Abnormal   Collection Time: 08/29/18  9:49 AM  Result Value Ref Range   Hemoglobin A1C 14.0 (A) 4.0 - 5.6 %   HbA1c POC (<> result, manual entry)     HbA1c, POC (prediabetic range)     HbA1c,  POC (controlled diabetic range)       ASSESSMENT & PLAN: Type 2 diabetes mellitus with hyperglycemia, without long-term current use of insulin (HCC) Uncontrolled diabetes with hemoglobin A1c 14 today.  Glucose 298.  I question compliance with medications but patient states he takes it regularly.  Will increase metformin to 1000 mg twice a day and glipizide 5 mg twice a day.  Will start Crestor 10 mg once a day.  Follow-up in 4 weeks.  Essential hypertension Uncontrolled hypertension.  We will start lisinopril 20 mg once a day.  Follow-up in 4 weeks.  Lipa was seen today for diabetes and hypertension.  Diagnoses and all orders for this visit:  Hyperglycemia -     Lipid panel -     CMP14+EGFR -     POCT glycosylated hemoglobin (Hb A1C)  Type 2 diabetes mellitus with hyperglycemia, without long-term current use of insulin (HCC) -     Lipid panel -     CMP14+EGFR -     POCT glycosylated hemoglobin (Hb A1C) -     POCT glucose (manual entry) -     rosuvastatin (CRESTOR) 10 MG tablet; Take 1 tablet (10 mg total) by mouth daily. -     metFORMIN (GLUCOPHAGE) 1000 MG tablet; Take 1 tablet (1,000 mg total) by mouth 2 (two) times daily with a meal. -     glipiZIDE (GLUCOTROL) 5 MG tablet; Take 1 tablet (5 mg total) by mouth 2 (two) times daily before a meal.  Essential hypertension -     lisinopril (PRINIVIL,ZESTRIL) 20 MG tablet; Take 1 tablet (20 mg total) by mouth daily.    Patient Instructions       If you have lab work done today you will be contacted with your lab results within the next 2 weeks.  If you have not heard from [place] then please contact us. The fastest way to get your results is to register for My Chart.   IF you received an x-ray today, you will receive an invoice from [facility]. Please contact [facility] at [phone] with questions or concerns regarding your invoice.   IF you received labwork today, you will receive an invoice from [facility].  Please contact [facility] at [phone] with questions or concerns regarding your invoice.   Our billing staff will not be able to assist you with questions regarding bills from these companies.  You will be contacted with the lab results as soon as they are available. The fastest way to get your results is to activate your My Chart account. Instructions are located on the last page of this paperwork. If you have not heard from [place] regarding the results in 2 weeks, please contact this office.      Diabetes Mellitus and Nutrition, Adult When you have diabetes (diabetes mellitus), it is very important to have healthy eating habits because your blood sugar (glucose) levels are greatly affected by what you eat and drink. Eating healthy foods in the appropriate amounts, at about the same times every day, can help you:  Control your blood glucose.  Lower your risk of heart disease.  Improve your blood pressure.  Reach or maintain a healthy weight. Every person with diabetes is different, and each person has different needs for a meal plan. Your health care provider may recommend that you work with a diet and nutrition specialist (dietitian) to make a meal plan that is best for you. Your meal plan may vary depending on factors such as:  The calories you need.  The medicines you take.  Your weight.  Your blood glucose, blood pressure, and cholesterol levels.  Your activity level.  Other health conditions you have, such as heart  or kidney disease. How do carbohydrates affect me? Carbohydrates, also called carbs, affect your blood glucose level more than any other type of food. Eating carbs naturally raises the amount of glucose in your blood. Carb counting is a method for keeping track of how many carbs you eat. Counting carbs is important to keep your blood glucose at a healthy level, especially if you use insulin or take certain oral diabetes medicines. It is important to know how many  carbs you can safely have in each meal. This is different for every person. Your dietitian can help you calculate how many carbs you should have at each meal and for each snack. Foods that contain carbs include:  Bread, cereal, rice, pasta, and crackers.  Potatoes and corn.  Peas, beans, and lentils.  Milk and yogurt.  Fruit and juice.  Desserts, such as cakes, cookies, ice cream, and candy. How does alcohol affect me? Alcohol can cause a sudden decrease in blood glucose (hypoglycemia), especially if you use insulin or take certain oral diabetes medicines. Hypoglycemia can be a life-threatening condition. Symptoms of hypoglycemia (sleepiness, dizziness, and confusion) are similar to symptoms of having too much alcohol. If your health care provider says that alcohol is safe for you, follow these guidelines:  Limit alcohol intake to no more than 1 drink per day for nonpregnant women and 2 drinks per day for men. One drink equals 12 oz of beer, 5 oz of wine, or 1 oz of hard liquor.  Do not drink on an empty stomach.  Keep yourself hydrated with water, diet soda, or unsweetened iced tea.  Keep in mind that regular soda, juice, and other mixers may contain a lot of sugar and must be counted as carbs. What are tips for following this plan?  Reading food labels  Start by checking the serving size on the "Nutrition Facts" label of packaged foods and drinks. The amount of calories, carbs, fats, and other nutrients listed on the label is based on one serving of the item. Many items contain more than one serving per package.  Check the total grams (g) of carbs in one serving. You can calculate the number of servings of carbs in one serving by dividing the total carbs by 15. For example, if a food has 30 g of total carbs, it would be equal to 2 servings of carbs.  Check the number of grams (g) of saturated and trans fats in one serving. Choose foods that have low or no amount of these  fats.  Check the number of milligrams (mg) of salt (sodium) in one serving. Most people should limit total sodium intake to less than 2,300 mg per day.  Always check the nutrition information of foods labeled as "low-fat" or "nonfat". These foods may be higher in added sugar or refined carbs and should be avoided.  Talk to your dietitian to identify your daily goals for nutrients listed on the label. Shopping  Avoid buying canned, premade, or processed foods. These foods tend to be high in fat, sodium, and added sugar.  Shop around the outside edge of the grocery store. This includes fresh fruits and vegetables, bulk grains, fresh meats, and fresh dairy. Cooking  Use low-heat cooking methods, such as baking, instead of high-heat cooking methods like deep frying.  Cook using healthy oils, such as olive, canola, or sunflower oil.  Avoid cooking with butter, cream, or high-fat meats. Meal planning  Eat meals and snacks regularly, preferably at the same times every  day. Avoid going long periods of time without eating.  Eat foods high in fiber, such as fresh fruits, vegetables, beans, and whole grains. Talk to your dietitian about how many servings of carbs you can eat at each meal.  Eat 4-6 ounces (oz) of lean protein each day, such as lean meat, chicken, fish, eggs, or tofu. One oz of lean protein is equal to: ? 1 oz of meat, chicken, or fish. ? 1 egg. ?  cup of tofu.  Eat some foods each day that contain healthy fats, such as avocado, nuts, seeds, and fish. Lifestyle  Check your blood glucose regularly.  Exercise regularly as told by your health care provider. This may include: ? 150 minutes of moderate-intensity or vigorous-intensity exercise each week. This could be brisk walking, biking, or water aerobics. ? Stretching and doing strength exercises, such as yoga or weightlifting, at least 2 times a week.  Take medicines as told by your health care provider.  Do not use any  products that contain nicotine or tobacco, such as cigarettes and e-cigarettes. If you need help quitting, ask your health care provider.  Work with a Social worker or diabetes educator to identify strategies to manage stress and any emotional and social challenges. Questions to ask a health care provider  Do I need to meet with a diabetes educator?  Do I need to meet with a dietitian?  What number can I call if I have questions?  When are the best times to check my blood glucose? Where to find more information:  American Diabetes Association: diabetes.org  Academy of Nutrition and Dietetics: www.eatright.[employer] of Diabetes and Digestive and Kidney Diseases (NIH): [employer] Summary  A healthy meal plan will help you control your blood glucose and maintain a healthy lifestyle.  Working with a diet and nutrition specialist (dietitian) can help you make a meal plan that is best for you.  Keep in mind that carbohydrates (carbs) and alcohol have immediate effects on your blood glucose levels. It is important to count carbs and to use alcohol carefully. This information is not intended to replace advice given to you by your health care provider. Make sure you discuss any questions you have with your health care provider. Document Released: 04/19/2005 Document Revised: 02/20/2017 Document Reviewed: 08/27/2016 Elsevier Interactive Patient Education  2019 [employer].  Hypertension Hypertension, commonly called high blood pressure, is when the force of blood pumping through the arteries is too strong. The arteries are the blood vessels that carry blood from the heart throughout the body. Hypertension forces the heart to work harder to pump blood and may cause arteries to become narrow or stiff. Having untreated or uncontrolled hypertension can cause heart attacks, strokes, kidney disease, and other problems. A blood pressure reading consists of a higher number over a  lower number. Ideally, your blood pressure should be below 120/80. The first ("top") number is called the systolic pressure. It is a measure of the pressure in your arteries as your heart beats. The second ("bottom") number is called the diastolic pressure. It is a measure of the pressure in your arteries as the heart relaxes. What are the causes? The cause of this condition is not known. What increases the risk? Some risk factors for high blood pressure are under your control. Others are not. Factors you can change  Smoking.  Having type 2 diabetes mellitus, high cholesterol, or both.  Not getting enough exercise or physical activity.  Being overweight.  Having too  much fat, sugar, calories, or salt (sodium) in your diet.  Drinking too much alcohol. Factors that are difficult or impossible to change  Having chronic kidney disease.  Having a family history of high blood pressure.  Age. Risk increases with age.  Race. You may be at higher risk if you are African-American.  Gender. Men are at higher risk than women before age 76. After age 21, women are at higher risk than men.  Having obstructive sleep apnea.  Stress. What are the signs or symptoms? Extremely high blood pressure (hypertensive crisis) may cause:  Headache.  Anxiety.  Shortness of breath.  Nosebleed.  Nausea and vomiting.  Severe chest pain.  Jerky movements you cannot control (seizures). How is this diagnosed? This condition is diagnosed by measuring your blood pressure while you are seated, with your arm resting on a surface. The cuff of the blood pressure monitor will be placed directly against the skin of your upper arm at the level of your heart. It should be measured at least twice using the same arm. Certain conditions can cause a difference in blood pressure between your right and left arms. Certain factors can cause blood pressure readings to be lower or higher than normal (elevated) for a  short period of time:  When your blood pressure is higher when you are in a health care provider's office than when you are at home, this is called white coat hypertension. Most people with this condition do not need medicines.  When your blood pressure is higher at home than when you are in a health care provider's office, this is called masked hypertension. Most people with this condition may need medicines to control blood pressure. If you have a high blood pressure reading during one visit or you have normal blood pressure with other risk factors:  You may be asked to return on a different day to have your blood pressure checked again.  You may be asked to monitor your blood pressure at home for 1 week or longer. If you are diagnosed with hypertension, you may have other blood or imaging tests to help your health care provider understand your overall risk for other conditions. How is this treated? This condition is treated by making healthy lifestyle changes, such as eating healthy foods, exercising more, and reducing your alcohol intake. Your health care provider may prescribe medicine if lifestyle changes are not enough to get your blood pressure under control, and if:  Your systolic blood pressure is above 130.  Your diastolic blood pressure is above 80. Your personal target blood pressure may vary depending on your medical conditions, your age, and other factors. Follow these instructions at home: Eating and drinking   Eat a diet that is high in fiber and potassium, and low in sodium, added sugar, and fat. An example eating plan is called the DASH (Dietary Approaches to Stop Hypertension) diet. To eat this way: ? Eat plenty of fresh fruits and vegetables. Try to fill half of your plate at each meal with fruits and vegetables. ? Eat whole grains, such as whole wheat pasta, brown rice, or whole grain bread. Fill about one quarter of your plate with whole grains. ? Eat or drink low-fat  dairy products, such as skim milk or low-fat yogurt. ? Avoid fatty cuts of meat, processed or cured meats, and poultry with skin. Fill about one quarter of your plate with lean proteins, such as fish, chicken without skin, beans, eggs, and tofu. ? Avoid premade  and processed foods. These tend to be higher in sodium, added sugar, and fat.  Reduce your daily sodium intake. Most people with hypertension should eat less than 1,500 mg of sodium a day.  Limit alcohol intake to no more than 1 drink a day for nonpregnant women and 2 drinks a day for men. One drink equals 12 oz of beer, 5 oz of wine, or 1 oz of hard liquor. Lifestyle   Work with your health care provider to maintain a healthy body weight or to lose weight. Ask what an ideal weight is for you.  Get at least 30 minutes of exercise that causes your heart to beat faster (aerobic exercise) most days of the week. Activities may include walking, swimming, or biking.  Include exercise to strengthen your muscles (resistance exercise), such as pilates or lifting weights, as part of your weekly exercise routine. Try to do these types of exercises for 30 minutes at least 3 days a week.  Do not use any products that contain nicotine or tobacco, such as cigarettes and e-cigarettes. If you need help quitting, ask your health care provider.  Monitor your blood pressure at home as told by your health care provider.  Keep all follow-up visits as told by your health care provider. This is important. Medicines  Take over-the-counter and prescription medicines only as told by your health care provider. Follow directions carefully. Blood pressure medicines must be taken as prescribed.  Do not skip doses of blood pressure medicine. Doing this puts you at risk for problems and can make the medicine less effective.  Ask your health care provider about side effects or reactions to medicines that you should watch for. Contact a health care provider  if:  You think you are having a reaction to a medicine you are taking.  You have headaches that keep coming back (recurring).  You feel dizzy.  You have swelling in your ankles.  You have trouble with your vision. Get help right away if:  You develop a severe headache or confusion.  You have unusual weakness or numbness.  You feel faint.  You have severe pain in your chest or abdomen.  You vomit repeatedly.  You have trouble breathing. Summary  Hypertension is when the force of blood pumping through your arteries is too strong. If this condition is not controlled, it may put you at risk for serious complications.  Your personal target blood pressure may vary depending on your medical conditions, your age, and other factors. For most people, a normal blood pressure is less than 120/80.  Hypertension is treated with lifestyle changes, medicines, or a combination of both. Lifestyle changes include weight loss, eating a healthy, low-sodium diet, exercising more, and limiting alcohol. This information is not intended to replace advice given to you by your health care provider. Make sure you discuss any questions you have with your health care provider. Document Released: 07/23/2005 Document Revised: 06/20/2016 Document Reviewed: 06/20/2016 Elsevier Interactive Patient Education  2019 Elsevier Inc.      Agustina Caroli, MD [facility]

## 2018-08-29 NOTE — Assessment & Plan Note (Signed)
Uncontrolled diabetes with hemoglobin A1c 14 today.  Glucose 298.  I question compliance with medications but patient states he takes it regularly.  Will increase metformin to 1000 mg twice a day and glipizide 5 mg twice a day.  Will start Crestor 10 mg once a day.  Follow-up in 4 weeks.

## 2018-09-02 ENCOUNTER — Encounter: Payer: Self-pay | Admitting: Radiology

## 2018-12-25 ENCOUNTER — Ambulatory Visit: Payer: Self-pay | Admitting: Emergency Medicine

## 2018-12-25 NOTE — Telephone Encounter (Signed)
Pt. Reports having vomiting, diarrhea since Monday. Headache as well. No availability in the practice. Will go to UC.  Answer Assessment - Initial Assessment Questions 1. VOMITING SEVERITY: "How many times have you vomited in the past 24 hours?"     - MILD:  1 - 2 times/day    - MODERATE: 3 - 5 times/day, decreased oral intake without significant weight loss or symptoms of dehydration    - SEVERE: 6 or more times/day, vomits everything or nearly everything, with significant weight loss, symptoms of dehydration      3 2. ONSET: "When did the vomiting begin?"      Started Monday 3. FLUIDS: "What fluids or food have you vomited up today?" "Have you been able to keep any fluids down?"     Yes - water 4. ABDOMINAL PAIN: "Are your having any abdominal pain?" If yes : "How bad is it and what does it feel like?" (e.g., crampy, dull, intermittent, constant)      No 5. DIARRHEA: "Is there any diarrhea?" If so, ask: "How many times today?"      3 6. CONTACTS: "Is there anyone else in the family with the same symptoms?"      No 7. CAUSE: "What do you think is causing your vomiting?"     Unsure 8. HYDRATION STATUS: "Any signs of dehydration?" (e.g., dry mouth [not only dry lips], too weak to stand) "When did you last urinate?"     Yes 9. OTHER SYMPTOMS: "Do you have any other symptoms?" (e.g., fever, headache, vertigo, vomiting blood or coffee grounds, recent head injury)     Headache - left eye pain 10. PREGNANCY: "Is there any chance you are pregnant?" "When was your last menstrual period?"       n/a  Protocols used: [facility]

## 2019-04-14 ENCOUNTER — Ambulatory Visit: Payer: Self-pay | Admitting: *Deleted

## 2019-04-14 NOTE — Telephone Encounter (Signed)
Appointment has been schduled for 04/16/2019 with provider for symptoms.

## 2019-04-14 NOTE — Telephone Encounter (Signed)
Pt reports "Vomiting since last Monday." Upon further triage assessment pt states "It's like reflux, not really vomitng." States "Regurgitates up in throat after drinking."  States has been eating soups, crackers without problem. Also reports "Burning pain mid stomach. Reports LBM 1 week ago; usual pattern every 2-3 days. Denies any additional symptoms. Does states "urine "A little bit darker." Denies dizziness, no headache,denies dry mouth , no weakness. NO new medications.  Attempted to reach practice for consideration of appt; extended time on hold. Assured pt [place] would route to practice for Dr. [name] Brunner review.  Care advise given; reviewed S/S dehydration. Pt able to do virtual appt.  Please advise: Pt's CB# [phone]  Reason for Disposition . MILD or MODERATE vomiting (e.g., 1 - 5 times / day)  Answer Assessment - Initial Assessment Questions 1. VOMITING SEVERITY: "How many times have you vomited in the past 24 hours?"     - MILD:  1 - 2 times/day    - MODERATE: 3 - 5 times/day, decreased oral intake without significant weight loss or symptoms of dehydration    - SEVERE: 6 or more times/day, vomits everything or nearly everything, with significant weight loss, symptoms of dehydration      After drinking 2. ONSET: "When did the vomiting begin?"      Last Monday 3. FLUIDS: "What fluids or food have you vomited up today?" "Have you been able to keep any fluids down?"     "Like reflux" 4. ABDOMINAL PAIN: "Are your having any abdominal pain?" If yes : "How bad is it and what does it feel like?" (e.g., crampy, dull, intermittent, constant)      Middle of stomach 5. DIARRHEA: "Is there any diarrhea?" If so, ask: "How many times today?"     Constipation x 1 week 6. CON TS: "Is there anyone else in the family with the same symptoms?"      no 7. CAUSE: "What do you think is causing your vomiting?"     Reflux 8. HYDRATION STATUS: "Any signs of dehydration?" (e.g., dry mouth [not only dry  lips], too weak to stand) "When did you last urinate?"    " Urine a little darker" 9. OTHER SYMPTOMS: "Do you have any other symptoms?" (e.g., fever, headache, vertigo, vomiting blood or coffee grounds, recent head injury)    none  Protocols used: [facility]

## 2019-04-16 ENCOUNTER — Other Ambulatory Visit: Payer: Self-pay

## 2019-04-16 ENCOUNTER — Ambulatory Visit (INDEPENDENT_AMBULATORY_CARE_PROVIDER_SITE_OTHER): Payer: Self-pay | Admitting: Emergency Medicine

## 2019-04-16 ENCOUNTER — Encounter: Payer: Self-pay | Admitting: Emergency Medicine

## 2019-04-16 VITALS — BP 137/88 | HR 99 | Temp 98.4°F | Resp 16 | Ht 64.0 in | Wt 148.4 lb

## 2019-04-16 DIAGNOSIS — E1165 Type 2 diabetes mellitus with hyperglycemia: Secondary | ICD-10-CM

## 2019-04-16 DIAGNOSIS — I10 Essential (primary) hypertension: Secondary | ICD-10-CM

## 2019-04-16 DIAGNOSIS — K2100 Gastro-esophageal reflux disease with esophagitis, without bleeding: Secondary | ICD-10-CM

## 2019-04-16 DIAGNOSIS — R110 Nausea: Secondary | ICD-10-CM

## 2019-04-16 LAB — POCT GLYCOSYLATED HEMOGLOBIN (HGB A1C): Hemoglobin A1C: 9.4 % — AB (ref 4.0–5.6)

## 2019-04-16 LAB — POCT CBC
Granulocyte percent: 6.8 %G — AB (ref 37–80)
HCT, POC: 38.6 % (ref 29–41)
Hemoglobin: 12.4 g/dL (ref 11–14.6)
Lymph, poc: 34.2 — AB (ref 0.6–3.4)
MCH, POC: 26.4 pg — AB (ref 27–31.2)
MCHC: 32.2 g/dL (ref 31.8–35.4)
MCV: 81.9 fL (ref 76–111)
MID (cbc): 60.8 — AB (ref 0–0.9)
MPV: 7.9 fL (ref 0–99.8)
POC Granulocyte: 0.6 — AB (ref 2–6.9)
POC LYMPH PERCENT: 5 %L — AB (ref 10–50)
POC MID %: 3.8 %M (ref 0–12)
Platelet Count, POC: 424 10*3/uL (ref 142–424)
RBC: 4.71 M/uL (ref 4.69–6.13)
RDW, POC: 13.5 %
WBC: 11.2 10*3/uL — AB (ref 4.6–10.2)

## 2019-04-16 LAB — GLUCOSE, POCT (MANUAL RESULT ENTRY): POC Glucose: 146 mg/dl — AB (ref 70–99)

## 2019-04-16 MED ORDER — ONDANSETRON 4 MG PO TBDP: 4.0000 mg | ORAL_TABLET | Freq: Three times a day (TID) | ORAL | 0 refills | Status: DC | PRN

## 2019-04-16 MED ORDER — OMEPRAZOLE 40 MG PO CPDR: 40.0000 mg | DELAYED_RELEASE_CAPSULE | Freq: Every day | ORAL | 3 refills | Status: DC

## 2019-04-16 NOTE — Patient Instructions (Addendum)
If you have lab work done today you will be contacted with your lab results within the next 2 weeks.  If you have not heard from [place] then please contact us. The fastest way to get your results is to register for My Chart.   IF you received an x-ray today, you will receive an invoice from [facility]. Please contact [facility] at [phone] with questions or concerns regarding your invoice.   IF you received labwork today, you will receive an invoice from [place]. Please contact [facility] at [phone] with questions or concerns regarding your invoice.   Our billing staff will not be able to assist you with questions regarding bills from these companies.  You will be contacted with the lab results as soon as they are available. The fastest way to get your results is to activate your My Chart account. Instructions are located on the last page of this paperwork. If you have not heard from [place] regarding the results in 2 weeks, please contact this office.     Gastroesophageal Reflux Disease, Adult Gastroesophageal reflux (GER) happens when acid from the stomach flows up into the tube that connects the mouth and the stomach (esophagus). Normally, food travels down the esophagus and stays in the stomach to be digested. With GER, food and stomach acid sometimes move back up into the esophagus. You may have a disease called gastroesophageal reflux disease (GERD) if the reflux:  Happens often.  Causes frequent or very bad symptoms.  Causes problems such as damage to the esophagus. When this happens, the esophagus becomes sore and swollen (inflamed). Over time, GERD can make small holes (ulcers) in the lining of the esophagus. What are the causes? This condition is caused by a problem with the muscle between the esophagus and the stomach. When this muscle is weak or not normal, it does not close properly to keep food and acid from coming back up from the stomach. The muscle  can be weak because of:  Tobacco use.  Pregnancy.  Having a certain type of hernia (hiatal hernia).  Alcohol use.  Certain foods and drinks, such as coffee, chocolate, onions, and peppermint. What increases the risk? You are more likely to develop this condition if you:  Are overweight.  Have a disease that affects your connective tissue.  Use NSAID medicines. What are the signs or symptoms? Symptoms of this condition include:  Heartburn.  Difficult or painful swallowing.  The feeling of having a lump in the throat.  A bitter taste in the mouth.  Bad breath.  Having a lot of saliva.  Having an upset or bloated stomach.  Belching.  Chest pain. Different conditions can cause chest pain. Make sure you see your doctor if you have chest pain.  Shortness of breath or noisy breathing (wheezing).  Ongoing (chronic) cough or a cough at night.  Wearing away of the surface of teeth (tooth enamel).  Weight loss. How is this treated? Treatment will depend on how bad your symptoms are. Your doctor may suggest:  Changes to your diet.  Medicine.  Surgery. Follow these instructions at home: Eating and drinking   Follow a diet as told by your doctor. You may need to avoid foods and drinks such as: ? Coffee and tea (with or without caffeine). ? Drinks that contain alcohol. ? Energy drinks and sports drinks. ? Bubbly (carbonated) drinks or sodas. ? Chocolate and cocoa. ? Peppermint and mint flavorings. ? Garlic and onions. ? Horseradish. ?  Spicy and acidic foods. These include peppers, chili powder, curry powder, vinegar, hot sauces, and BBQ sauce. ? Citrus fruit juices and citrus fruits, such as oranges, lemons, and limes. ? Tomato-based foods. These include red sauce, chili, salsa, and pizza with red sauce. ? Fried and fatty foods. These include donuts, french fries, potato chips, and high-fat dressings. ? High-fat meats. These include hot dogs, rib eye steak,  sausage, ham, and bacon. ? High-fat dairy items, such as whole milk, butter, and cream cheese.  Eat small meals often. Avoid eating large meals.  Avoid drinking large amounts of liquid with your meals.  Avoid eating meals during the 2-3 hours before bedtime.  Avoid lying down right after you eat.  Do not exercise right after you eat. Lifestyle   Do not use any products that contain nicotine or tobacco. These include cigarettes, e-cigarettes, and chewing tobacco. If you need help quitting, ask your doctor.  Try to lower your stress. If you need help doing this, ask your doctor.  If you are overweight, lose an amount of weight that is healthy for you. Ask your doctor about a safe weight loss goal. General instructions  Pay attention to any changes in your symptoms.  Take over-the-counter and prescription medicines only as told by your doctor. Do not take aspirin, ibuprofen, or other NSAIDs unless your doctor says it is okay.  Wear loose clothes. Do not wear anything tight around your waist.  Raise (elevate) the head of your bed about 6 inches (15 cm).  Avoid bending over if this makes your symptoms worse.  Keep all follow-up visits as told by your doctor. This is important. Contact a doctor if:  You have new symptoms.  You lose weight and you do not know why.  You have trouble swallowing or it hurts to swallow.  You have wheezing or a cough that keeps happening.  Your symptoms do not get better with treatment.  You have a hoarse voice. Get help right away if:  You have pain in your arms, neck, jaw, teeth, or back.  You feel sweaty, dizzy, or light-headed.  You have chest pain or shortness of breath.  You throw up (vomit) and your throw-up looks like blood or coffee grounds.  You pass out (faint).  Your poop (stool) is bloody or black.  You cannot swallow, drink, or eat. Summary  If a person has gastroesophageal reflux disease (GERD), food and stomach acid  move back up into the esophagus and cause symptoms or problems such as damage to the esophagus.  Treatment will depend on how bad your symptoms are.  Follow a diet as told by your doctor.  Take all medicines only as told by your doctor. This information is not intended to replace advice given to you by your health care provider. Make sure you discuss any questions you have with your health care provider. Document Released: 01/09/2008 Document Revised: 01/29/2018 Document Reviewed: 01/29/2018 Elsevier Patient Education  2020 [employer].

## 2019-04-16 NOTE — Progress Notes (Signed)
Tony Schmitt 48 y.o.   No chief complaint on file.   HISTORY OF PRESENT ILLNESS: This is a 48 y.o. male diabetic and hypertensive, noncompliant with medication, complaining of "reflux problems" with nausea and occasional vomiting.  Describes epigastric burning sensation with heartburn and reflux into his chest.  This has been going on for months.  Denies melena or rectal bleeding.  Denies hematemesis.  Denies syncope.  Denies any other significant symptoms.  HPI   Prior to Admission medications   Medication Sig Start Date End Date Taking? Authorizing Provider  glipiZIDE (GLUCOTROL) 5 MG tablet Take 1 tablet (5 mg total) by mouth 2 (two) times daily before a meal. 08/29/18 11/27/18  Tony Schmitt, Tony Bloomer, MD  lisinopril (PRINIVIL,ZESTRIL) 20 MG tablet Take 1 tablet (20 mg total) by mouth daily. 08/29/18   Tony Pollen, MD  metFORMIN (GLUCOPHAGE) 1000 MG tablet Take 1 tablet (1,000 mg total) by mouth 2 (two) times daily with a meal. 08/29/18   Tony Schmitt, Tony Bloomer, MD  rosuvastatin (CRESTOR) 10 MG tablet Take 1 tablet (10 mg total) by mouth daily. 08/29/18   Tony Pollen, MD    No Known Allergies  Patient Active Problem List   Diagnosis Date Noted  . Type 2 diabetes mellitus with hyperglycemia, without long-term current use of insulin (Tony Schmitt) 08/29/2018  . Essential hypertension 08/29/2018    Past Medical History:  Diagnosis Date  . Diabetes mellitus without complication ([place])     History reviewed. No pertinent surgical history.  Social History   Socioeconomic History  . Marital status: Single    Spouse name: Not on file  . Number of children: Not on file  . Years of education: Not on file  . Highest education level: Not on file  Occupational History  . Not on file  Social Needs  . Financial resource strain: Not on file  . Food insecurity    Worry: Not on file    Inability: Not on file  . Transportation needs    Medical: Not on file    Non-medical: Not  on file  Tobacco Use  . Smoking status: Former Smoker    Types: Cigarettes, Cigars  . Smokeless tobacco: Never Used  Substance and Sexual Activity  . Alcohol use: Not Currently  . Drug use: Never  . Sexual activity: Not on file  Lifestyle  . Physical activity    Days per week: Not on file    Minutes per session: Not on file  . Stress: Not on file  Relationships  . Social Herbalist on phone: Not on file    Gets together: Not on file    Attends religious service: Not on file    Active member of club or organization: Not on file    Attends meetings of clubs or organizations: Not on file    Relationship status: Not on file  . Intimate partner violence    Fear of current or ex partner: Not on file    Emotionally abused: Not on file    Physically abused: Not on file    Forced sexual activity: Not on file  Other Topics Concern  . Not on file  Social History Narrative  . Not on file    Family History  Problem Relation Age of Onset  . Diabetes Mother   . Diabetes Father   . Hypertension Father   . Cancer Father   . Heart failure Other      Review of  Systems  Constitutional: Negative.  Negative for chills and fever.  HENT: Negative.  Negative for congestion and sore throat.   Respiratory: Negative.  Negative for cough and shortness of breath.   Cardiovascular: Negative.  Negative for chest pain and palpitations.  Gastrointestinal: Positive for diarrhea, heartburn, nausea and vomiting.  Genitourinary: Negative.  Negative for dysuria and hematuria.  Skin: Negative.  Negative for rash.  Neurological: Negative.  Negative for dizziness and headaches.  All other systems reviewed and are negative.  Today's Vitals   04/16/19 1407  BP: 137/88  Pulse: 99  Resp: 16  Temp: 98.4 F (36.9 C)  TempSrc: Oral  SpO2: 100%  Weight: 148 lb 6.4 oz (67.3 kg)  Height: 5\' 4"  (1.626 m)   Body mass index is 25.47 kg/m.   Physical Exam Vitals signs reviewed.   Constitutional:      Appearance: Normal appearance.  HENT:     Head: Normocephalic.  Eyes:     Extraocular Movements: Extraocular movements intact.     Pupils: Pupils are equal, round, and reactive to light.  Neck:     Musculoskeletal: Normal range of motion and neck supple.  Cardiovascular:     Rate and Rhythm: Normal rate and regular rhythm.     Pulses: Normal pulses.     Heart sounds: Normal heart sounds.  Pulmonary:     Effort: Pulmonary effort is normal.     Breath sounds: Normal breath sounds.  Musculoskeletal: Normal range of motion.  Skin:    General: Skin is warm and dry.  Neurological:     General: No focal deficit present.     Mental Status: He is alert and oriented to person, place, and time.  Psychiatric:        Mood and Affect: Mood normal.        Behavior: Behavior normal.    Results for orders placed or performed in visit on 04/16/19 (from the past 24 hour(s))  POCT CBC     Status: Abnormal   Collection Time: 04/16/19  2:26 PM  Result Value Ref Range   WBC 11.2 (A) 4.6 - 10.2 K/uL   Lymph, poc 34.2 (A) 0.6 - 3.4   POC LYMPH PERCENT 5.0 (A) 10 - 50 %L   MID (cbc) 60.8 (A) 0 - 0.9   POC MID % 3.8 0 - 12 %M   POC Granulocyte 0.6 (A) 2 - 6.9   Granulocyte percent 6.8 (A) 37 - 80 %G   RBC 4.71 4.69 - 6.13 M/uL   Hemoglobin 12.4 11 - 14.6 g/dL   HCT, POC 38.6 29 - 41 %   MCV 81.9 76 - 111 fL   MCH, POC 26.4 (A) 27 - 31.2 pg   MCHC 32.2 31.8 - 35.4 g/dL   RDW, POC 13.5 %   Platelet Count, POC 424 142 - 424 K/uL   MPV 7.9 0 - 99.8 fL  POCT glucose (manual entry)     Status: Abnormal   Collection Time: 04/16/19  2:27 PM  Result Value Ref Range   POC Glucose 146 (A) 70 - 99 mg/dl  POCT glycosylated hemoglobin (Hb A1C)     Status: Abnormal   Collection Time: 04/16/19  2:28 PM  Result Value Ref Range   Hemoglobin A1C 9.4 (A) 4.0 - 5.6 %   HbA1c POC (<> result, manual entry)     HbA1c, POC (prediabetic range)     HbA1c, POC (controlled diabetic range)  ASSESSMENT & PLAN: [name] was seen today for gastroesophageal reflux.  Diagnoses and all orders for this visit:  Type 2 diabetes mellitus with hyperglycemia, without long-term current use of insulin (HCC) -     POCT CBC -     POCT glucose (manual entry) -     POCT glycosylated hemoglobin (Hb A1C) -     Comprehensive metabolic panel -     POCT urinalysis dipstick -     HM Diabetes Foot Exam  Essential hypertension  Nausea without vomiting -     ondansetron (ZOFRAN ODT) 4 MG disintegrating tablet; Take 1 tablet (4 mg total) by mouth every 8 (eight) hours as needed for nausea or vomiting.  GERD with esophagitis -     omeprazole (PRILOSEC) 40 MG capsule; Take 1 capsule (40 mg total) by mouth daily.    Patient Instructions       If you have lab work done today you will be contacted with your lab results within the next 2 weeks.  If you have not heard from [place] then please contact us. The fastest way to get your results is to register for My Chart.   IF you received an x-ray today, you will receive an invoice from [facility]. Please contact [facility] at [phone] with questions or concerns regarding your invoice.   IF you received labwork today, you will receive an invoice from [place]. Please contact [facility] at [phone] with questions or concerns regarding your invoice.   Our billing staff will not be able to assist you with questions regarding bills from these companies.  You will be contacted with the lab results as soon as they are available. The fastest way to get your results is to activate your My Chart account. Instructions are located on the last page of this paperwork. If you have not heard from [place] regarding the results in 2 weeks, please contact this office.     Gastroesophageal Reflux Disease, Adult Gastroesophageal reflux (GER) happens when acid from the stomach flows up into the tube that connects the mouth and the stomach  (esophagus). Normally, food travels down the esophagus and stays in the stomach to be digested. With GER, food and stomach acid sometimes move back up into the esophagus. You may have a disease called gastroesophageal reflux disease (GERD) if the reflux:  Happens often.  Causes frequent or very bad symptoms.  Causes problems such as damage to the esophagus. When this happens, the esophagus becomes sore and swollen (inflamed). Over time, GERD can make small holes (ulcers) in the lining of the esophagus. What are the causes? This condition is caused by a problem with the muscle between the esophagus and the stomach. When this muscle is weak or not normal, it does not close properly to keep food and acid from coming back up from the stomach. The muscle can be weak because of:  Tobacco use.  Pregnancy.  Having a certain type of hernia (hiatal hernia).  Alcohol use.  Certain foods and drinks, such as coffee, chocolate, onions, and peppermint. What increases the risk? You are more likely to develop this condition if you:  Are overweight.  Have a disease that affects your connective tissue.  Use NSAID medicines. What are the signs or symptoms? Symptoms of this condition include:  Heartburn.  Difficult or painful swallowing.  The feeling of having a lump in the throat.  A bitter taste in the mouth.  Bad breath.  Having a lot of saliva.  Having  an upset or bloated stomach.  Belching.  Chest pain. Different conditions can cause chest pain. Make sure you see your doctor if you have chest pain.  Shortness of breath or noisy breathing (wheezing).  Ongoing (chronic) cough or a cough at night.  Wearing away of the surface of teeth (tooth enamel).  Weight loss. How is this treated? Treatment will depend on how bad your symptoms are. Your doctor may suggest:  Changes to your diet.  Medicine.  Surgery. Follow these instructions at home: Eating and drinking   Follow a  diet as told by your doctor. You may need to avoid foods and drinks such as: ? Coffee and tea (with or without caffeine). ? Drinks that contain alcohol. ? Energy drinks and sports drinks. ? Bubbly (carbonated) drinks or sodas. ? Chocolate and cocoa. ? Peppermint and mint flavorings. ? Garlic and onions. ? Horseradish. ? Spicy and acidic foods. These include peppers, chili powder, curry powder, vinegar, hot sauces, and BBQ sauce. ? Citrus fruit juices and citrus fruits, such as oranges, lemons, and limes. ? Tomato-based foods. These include red sauce, chili, salsa, and pizza with red sauce. ? Fried and fatty foods. These include donuts, french fries, potato chips, and high-fat dressings. ? High-fat meats. These include hot dogs, rib eye steak, sausage, ham, and bacon. ? High-fat dairy items, such as whole milk, butter, and cream cheese.  Eat small meals often. Avoid eating large meals.  Avoid drinking large amounts of liquid with your meals.  Avoid eating meals during the 2-3 hours before bedtime.  Avoid lying down right after you eat.  Do not exercise right after you eat. Lifestyle   Do not use any products that contain nicotine or tobacco. These include cigarettes, e-cigarettes, and chewing tobacco. If you need help quitting, ask your doctor.  Try to lower your stress. If you need help doing this, ask your doctor.  If you are overweight, lose an amount of weight that is healthy for you. Ask your doctor about a safe weight loss goal. General instructions  Pay attention to any changes in your symptoms.  Take over-the-counter and prescription medicines only as told by your doctor. Do not take aspirin, ibuprofen, or other NSAIDs unless your doctor says it is okay.  Wear loose clothes. Do not wear anything tight around your waist.  Raise (elevate) the head of your bed about 6 inches (15 cm).  Avoid bending over if this makes your symptoms worse.  Keep all follow-up visits as  told by your doctor. This is important. Contact a doctor if:  You have new symptoms.  You lose weight and you do not know why.  You have trouble swallowing or it hurts to swallow.  You have wheezing or a cough that keeps happening.  Your symptoms do not get better with treatment.  You have a hoarse voice. Get help right away if:  You have pain in your arms, neck, jaw, teeth, or back.  You feel sweaty, dizzy, or light-headed.  You have chest pain or shortness of breath.  You throw up (vomit) and your throw-up looks like blood or coffee grounds.  You pass out (faint).  Your poop (stool) is bloody or black.  You cannot swallow, drink, or eat. Summary  If a person has gastroesophageal reflux disease (GERD), food and stomach acid move back up into the esophagus and cause symptoms or problems such as damage to the esophagus.  Treatment will depend on how bad your symptoms are.  Follow a diet as told by your doctor.  Take all medicines only as told by your doctor. This information is not intended to replace advice given to you by your health care provider. Make sure you discuss any questions you have with your health care provider. Document Released: 01/09/2008 Document Revised: 01/29/2018 Document Reviewed: 01/29/2018 Elsevier Patient Education  2020 [employer]      Agustina Caroli, MD [facility]

## 2019-04-17 ENCOUNTER — Emergency Department (HOSPITAL_COMMUNITY): Payer: Self-pay

## 2019-04-17 ENCOUNTER — Encounter (HOSPITAL_COMMUNITY): Payer: Self-pay

## 2019-04-17 ENCOUNTER — Encounter: Payer: Self-pay | Admitting: Emergency Medicine

## 2019-04-17 ENCOUNTER — Telehealth: Payer: Self-pay | Admitting: Emergency Medicine

## 2019-04-17 ENCOUNTER — Other Ambulatory Visit: Payer: Self-pay

## 2019-04-17 ENCOUNTER — Inpatient Hospital Stay (HOSPITAL_COMMUNITY)
Admission: EM | Admit: 2019-04-17 | Discharge: 2019-04-19 | DRG: 683 | Disposition: A | Discharge status: Home / Self Care | Payer: Self-pay | Attending: Internal Medicine | Admitting: Internal Medicine

## 2019-04-17 DIAGNOSIS — E785 Hyperlipidemia, unspecified: Secondary | ICD-10-CM | POA: Diagnosis present

## 2019-04-17 DIAGNOSIS — Z8249 Family history of ischemic heart disease and other diseases of the circulatory system: Secondary | ICD-10-CM

## 2019-04-17 DIAGNOSIS — E1159 Type 2 diabetes mellitus with other circulatory complications: Secondary | ICD-10-CM | POA: Diagnosis present

## 2019-04-17 DIAGNOSIS — Z833 Family history of diabetes mellitus: Secondary | ICD-10-CM

## 2019-04-17 DIAGNOSIS — E872 Acidosis: Secondary | ICD-10-CM | POA: Diagnosis present

## 2019-04-17 DIAGNOSIS — E860 Dehydration: Secondary | ICD-10-CM | POA: Diagnosis present

## 2019-04-17 DIAGNOSIS — Z87891 Personal history of nicotine dependence: Secondary | ICD-10-CM

## 2019-04-17 DIAGNOSIS — R0789 Other chest pain: Secondary | ICD-10-CM | POA: Diagnosis present

## 2019-04-17 DIAGNOSIS — E1165 Type 2 diabetes mellitus with hyperglycemia: Secondary | ICD-10-CM | POA: Diagnosis present

## 2019-04-17 DIAGNOSIS — I129 Hypertensive chronic kidney disease with stage 1 through stage 4 chronic kidney disease, or unspecified chronic kidney disease: Secondary | ICD-10-CM | POA: Diagnosis present

## 2019-04-17 DIAGNOSIS — N179 Acute kidney failure, unspecified: Principal | ICD-10-CM | POA: Diagnosis present

## 2019-04-17 DIAGNOSIS — E1169 Type 2 diabetes mellitus with other specified complication: Secondary | ICD-10-CM | POA: Diagnosis present

## 2019-04-17 DIAGNOSIS — Z9114 Patient's other noncompliance with medication regimen: Secondary | ICD-10-CM

## 2019-04-17 DIAGNOSIS — I152 Hypertension secondary to endocrine disorders: Secondary | ICD-10-CM | POA: Diagnosis present

## 2019-04-17 DIAGNOSIS — N183 Chronic kidney disease, stage 3 (moderate): Secondary | ICD-10-CM | POA: Diagnosis present

## 2019-04-17 DIAGNOSIS — Z20828 Contact with and (suspected) exposure to other viral communicable diseases: Secondary | ICD-10-CM | POA: Diagnosis present

## 2019-04-17 DIAGNOSIS — Z7984 Long term (current) use of oral hypoglycemic drugs: Secondary | ICD-10-CM

## 2019-04-17 DIAGNOSIS — E1122 Type 2 diabetes mellitus with diabetic chronic kidney disease: Secondary | ICD-10-CM | POA: Diagnosis present

## 2019-04-17 DIAGNOSIS — R079 Chest pain, unspecified: Secondary | ICD-10-CM | POA: Diagnosis present

## 2019-04-17 DIAGNOSIS — R110 Nausea: Secondary | ICD-10-CM

## 2019-04-17 DIAGNOSIS — Z79899 Other long term (current) drug therapy: Secondary | ICD-10-CM

## 2019-04-17 DIAGNOSIS — K219 Gastro-esophageal reflux disease without esophagitis: Secondary | ICD-10-CM | POA: Diagnosis present

## 2019-04-17 DIAGNOSIS — I10 Essential (primary) hypertension: Secondary | ICD-10-CM

## 2019-04-17 DIAGNOSIS — K529 Noninfective gastroenteritis and colitis, unspecified: Secondary | ICD-10-CM | POA: Diagnosis present

## 2019-04-17 HISTORY — DX: Essential (primary) hypertension: I10

## 2019-04-17 LAB — COMPREHENSIVE METABOLIC PANEL
ALT: 33 IU/L (ref 0–44)
ALT: 33 U/L (ref 0–44)
AST: 22 IU/L (ref 0–40)
AST: 19 U/L (ref 15–41)
Albumin/Globulin Ratio: 1.4 (ref 1.2–2.2)
Albumin: 3.6 g/dL — ABNORMAL LOW (ref 4.0–5.0)
Albumin: 3.1 g/dL — ABNORMAL LOW (ref 3.5–5.0)
Alkaline Phosphatase: 92 IU/L (ref 39–117)
Alkaline Phosphatase: 93 U/L (ref 38–126)
Anion gap: 14 (ref 5–15)
BUN/Creatinine Ratio: 15 (ref 9–20)
BUN: 50 mg/dL — ABNORMAL HIGH (ref 6–24)
BUN: 45 mg/dL — ABNORMAL HIGH (ref 6–20)
Bilirubin Total: 0.4 mg/dL (ref 0.0–1.2)
CO2: 27 mmol/L (ref 20–29)
CO2: 23 mmol/L (ref 22–32)
Calcium: 9.9 mg/dL (ref 8.7–10.2)
Calcium: 9.6 mg/dL (ref 8.9–10.3)
Chloride: 93 mmol/L — ABNORMAL LOW (ref 96–106)
Chloride: 99 mmol/L (ref 98–111)
Creatinine, Ser: 3.35 mg/dL (ref 0.76–1.27)
Creatinine, Ser: 3.72 mg/dL — ABNORMAL HIGH (ref 0.61–1.24)
GFR calc Af Amer: 24 mL/min/{1.73_m2} — ABNORMAL LOW (ref >59)
GFR calc Af Amer: 21 mL/min — ABNORMAL LOW (ref >60)
GFR calc non Af Amer: 20 mL/min/{1.73_m2} — ABNORMAL LOW (ref >59)
GFR calc non Af Amer: 18 mL/min — ABNORMAL LOW (ref >60)
Globulin, Total: 2.5 g/dL (ref 1.5–4.5)
Glucose, Bld: 182 mg/dL — ABNORMAL HIGH (ref 70–99)
Glucose: 173 mg/dL — ABNORMAL HIGH (ref 65–99)
Potassium: 4.1 mmol/L (ref 3.5–5.2)
Potassium: 3.8 mmol/L (ref 3.5–5.1)
Sodium: 137 mmol/L (ref 134–144)
Sodium: 136 mmol/L (ref 135–145)
Total Bilirubin: 0.8 mg/dL (ref 0.3–1.2)
Total Protein: 6.1 g/dL (ref 6.0–8.5)
Total Protein: 6.5 g/dL (ref 6.5–8.1)

## 2019-04-17 LAB — URINALYSIS, ROUTINE W REFLEX MICROSCOPIC
Bacteria, UA: NONE SEEN
Bilirubin Urine: NEGATIVE
Glucose, UA: 50 mg/dL — AB
Ketones, ur: NEGATIVE mg/dL
Leukocytes,Ua: NEGATIVE
Nitrite: NEGATIVE
Protein, ur: >=300 mg/dL — AB
Specific Gravity, Urine: 1.015 (ref 1.005–1.030)
pH: 5 (ref 5.0–8.0)

## 2019-04-17 LAB — CBC WITH DIFFERENTIAL/PLATELET
Abs Immature Granulocytes: 0.03 10*3/uL (ref 0.00–0.07)
Basophils Absolute: 0.1 10*3/uL (ref 0.0–0.1)
Basophils Relative: 1 %
Eosinophils Absolute: 0.1 10*3/uL (ref 0.0–0.5)
Eosinophils Relative: 1 %
HCT: 38.5 % — ABNORMAL LOW (ref 39.0–52.0)
Hemoglobin: 11.9 g/dL — ABNORMAL LOW (ref 13.0–17.0)
Immature Granulocytes: 0 %
Lymphocytes Relative: 26 %
Lymphs Abs: 2.1 10*3/uL (ref 0.7–4.0)
MCH: 26.5 pg (ref 26.0–34.0)
MCHC: 30.9 g/dL (ref 30.0–36.0)
MCV: 85.7 fL (ref 80.0–100.0)
Monocytes Absolute: 0.6 10*3/uL (ref 0.1–1.0)
Monocytes Relative: 8 %
Neutro Abs: 5.2 10*3/uL (ref 1.7–7.7)
Neutrophils Relative %: 64 %
Platelets: 426 10*3/uL — ABNORMAL HIGH (ref 150–400)
RBC: 4.49 MIL/uL (ref 4.22–5.81)
RDW: 13 % (ref 11.5–15.5)
WBC: 8.1 10*3/uL (ref 4.0–10.5)
nRBC: 0 % (ref 0.0–0.2)

## 2019-04-17 LAB — CREATININE, URINE, RANDOM: Creatinine, Urine: 219.86 mg/dL

## 2019-04-17 LAB — LIPASE, BLOOD: Lipase: 89 U/L — ABNORMAL HIGH (ref 11–51)

## 2019-04-17 LAB — RAPID URINE DRUG SCREEN, HOSP PERFORMED
Amphetamines: NOT DETECTED
Barbiturates: NOT DETECTED
Benzodiazepines: NOT DETECTED
Cocaine: NOT DETECTED
Opiates: NOT DETECTED
Tetrahydrocannabinol: NOT DETECTED

## 2019-04-17 LAB — SODIUM, URINE, RANDOM: Sodium, Ur: 42 mmol/L

## 2019-04-17 MED ORDER — SODIUM CHLORIDE 0.9 % IV BOLUS
1000.0000 mL | Freq: Once | INTRAVENOUS | Status: AC
  Administered 2019-04-17: 22:00:00 1000 mL via INTRAVENOUS

## 2019-04-17 NOTE — ED Triage Notes (Addendum)
Patient states he was sent over by PCP because his kidneys are failing.    Patient states last week he was vomiting.   Denies N/V today.  Denies diarrhea  Denies pain.  Denies weakness.    Creatinine 3.35    A/ox4 Ambulatory in triage.

## 2019-04-17 NOTE — ED Provider Notes (Signed)
[facility] DEPT Provider Note   CSN: [id number] Arrival date & time: 04/17/19  1604     History   Chief Complaint No chief complaint on file.   HPI Tony Schmitt is a 48 y.o. male.     Patient is a 48 year old male with a history of diabetes and hypertension.  Who presents with renal failure.  He had about a one-week history of nausea and vomiting last week after he said he and his sister went on a eating spree.  He says he ate all the wrong foods and pay for it.  He had multiple episodes of vomiting for several days.  His last vomiting was about 2 days ago.  He had no evidence of diarrhea.  No fevers.  No abdominal pain.  He had a follow-up with his PCP who did blood work and noticed that over the last couple days his creatinine is been climbing.  He was sent over here for admission for acute renal failure.     Past Medical History:  Diagnosis Date  . Diabetes mellitus without complication ([place])   . Hypertension     Patient Active Problem List   Diagnosis Date Noted  . AKI (acute kidney injury) ([place]) 04/17/2019  . Dehydration 04/17/2019  . Type 2 diabetes mellitus with hyperglycemia, without long-term current use of insulin ([place]) 08/29/2018  . Essential hypertension 08/29/2018    History reviewed. No pertinent surgical history.      Home Medications    Prior to Admission medications   Medication Sig Start Date End Date Taking? Authorizing Provider  glipiZIDE (GLUCOTROL) 5 MG tablet Take 1 tablet (5 mg total) by mouth 2 (two) times daily before a meal. 08/29/18 04/17/19 Yes Sagardia, Ines Bloomer, MD  lisinopril (PRINIVIL,ZESTRIL) 20 MG tablet Take 1 tablet (20 mg total) by mouth daily. 08/29/18  Yes Horald Pollen, MD  metFORMIN (GLUCOPHAGE) 1000 MG tablet Take 1 tablet (1,000 mg total) by mouth 2 (two) times daily with a meal. 08/29/18  Yes Sagardia, Ines Bloomer, MD  omeprazole (PRILOSEC) 40 MG capsule Take 1 capsule (40 mg total) by  mouth daily. 04/16/19  Yes Sagardia, Ines Bloomer, MD  ondansetron (ZOFRAN ODT) 4 MG disintegrating tablet Take 1 tablet (4 mg total) by mouth every 8 (eight) hours as needed for nausea or vomiting. 04/16/19  Yes Sagardia, Ines Bloomer, MD  rosuvastatin (CRESTOR) 10 MG tablet Take 1 tablet (10 mg total) by mouth daily. Patient not taking: Reported on 04/16/2019 08/29/18   Horald Pollen, MD    Family History Family History  Problem Relation Age of Onset  . Diabetes Mother   . Diabetes Father   . Hypertension Father   . Cancer Father   . Heart failure Other     Social History Social History   Tobacco Use  . Smoking status: Former Smoker    Types: Cigarettes, Cigars  . Smokeless tobacco: Never Used  Substance Use Topics  . Alcohol use: Not Currently  . Drug use: Never     Allergies   Patient has no known allergies.   Review of Systems Review of Systems  Constitutional: Positive for fatigue. Negative for chills, diaphoresis and fever.  HENT: Negative for congestion, rhinorrhea and sneezing.   Eyes: Negative.   Respiratory: Negative for cough, chest tightness and shortness of breath.   Cardiovascular: Negative for chest pain and leg swelling.  Gastrointestinal: Positive for nausea and vomiting. Negative for abdominal pain, blood in stool and diarrhea.  Genitourinary: Negative for difficulty urinating, flank pain, frequency and hematuria.  Musculoskeletal: Negative for arthralgias and back pain.  Skin: Negative for rash.  Neurological: Negative for dizziness, speech difficulty, weakness, numbness and headaches.     Physical Exam Updated Vital Signs BP (!) 157/100   Pulse 86   Temp 98.6 F (37 C) (Oral)   Resp 16   SpO2 100%   Physical Exam Constitutional:      Appearance: He is well-developed.  HENT:     Head: Normocephalic and atraumatic.  Eyes:     Pupils: Pupils are equal, round, and reactive to light.  Neck:     Musculoskeletal: Normal range of motion  and neck supple.  Cardiovascular:     Rate and Rhythm: Normal rate and regular rhythm.     Heart sounds: Normal heart sounds.  Pulmonary:     Effort: Pulmonary effort is normal. No respiratory distress.     Breath sounds: Normal breath sounds. No wheezing or rales.  Chest:     Chest wall: No tenderness.  Abdominal:     General: Bowel sounds are normal.     Palpations: Abdomen is soft.     Tenderness: There is no abdominal tenderness. There is no guarding or rebound.  Musculoskeletal: Normal range of motion.  Lymphadenopathy:     Cervical: No cervical adenopathy.  Skin:    General: Skin is warm and dry.     Findings: No rash.  Neurological:     Mental Status: He is alert and oriented to person, place, and time.      ED Treatments / Results  Labs (all labs ordered are listed, but only abnormal results are displayed) Labs Reviewed  COMPREHENSIVE METABOLIC PANEL - Abnormal; Notable for the following components:      Result Value   Glucose, Bld 182 (*)    BUN 45 (*)    Creatinine, Ser 3.72 (*)    Albumin 3.1 (*)    GFR calc non Af Amer 18 (*)    GFR calc Af Amer 21 (*)    All other components within normal limits  LIPASE, BLOOD - Abnormal; Notable for the following components:   Lipase 89 (*)    All other components within normal limits  CBC WITH DIFFERENTIAL/PLATELET - Abnormal; Notable for the following components:   Hemoglobin 11.9 (*)    HCT 38.5 (*)    Platelets 426 (*)    All other components within normal limits  URINALYSIS, ROUTINE W REFLEX MICROSCOPIC - Abnormal; Notable for the following components:   Glucose, UA 50 (*)    Hgb urine dipstick SMALL (*)    Protein, ur >=300 (*)    All other components within normal limits  SARS CORONAVIRUS 2 (TAT 6-24 HRS)  CREATININE, URINE, RANDOM  SODIUM, URINE, RANDOM  RAPID URINE DRUG SCREEN, HOSP PERFORMED    EKG None  Radiology No results found.  Procedures Procedures (including critical care time)   Medications Ordered in ED Medications  sodium chloride 0.9 % bolus 1,000 mL (1,000 mLs Intravenous New Bag/Given 04/17/19 2218)     Initial Impression / Assessment and Plan / ED Course  I have reviewed the triage vital signs and the nursing notes.  Pertinent labs & imaging results that were available during my care of the patient were reviewed by me and considered in my medical decision making (see chart for details).        Patient presents with acute renal failure.  He was hydrated.  His potassium is normal.  I spoke with Dr. Roel Cluck who admit the patient for further treatment.  Final Clinical Impressions(s) / ED Diagnoses   Final diagnoses:  Acute renal failure, unspecified acute renal failure type [facility])    ED Discharge Orders    None       Malvin Johns, MD 04/17/19 2255

## 2019-04-17 NOTE — H&P (Signed)
Tony Schmitt [id number] DOB: Jul 05, 1971 DOA: 04/17/2019     PCP: Horald Pollen, MD   [facility]     Patient arrived to ER on 04/17/19 at 1604  Patient coming from: home Lives  With family    Chief Complaint: Was told he has renal failure   HPI: Tony Schmitt is a 48 y.o. male with medical history significant of HTN, DM CKD, GERD    Presented with   abnormal lab values from his PCP Has reported some nausea vomiting for the past 1 week. Started after he has been eating excessive amount of food" he and his sister went on the eating spree" He has been repeatedly vomiting for past few days no diarrhea associated with no fevers no abdominal pain to his primary care provider he has a regular visit yesterday his creatinine noted to be elevated to 3.2 from a baseline of 1.2 he was told to present to emergency department  Reports heaviness in his chest this AM currently resolved   Infectious risk factors:  Reports N/V   In  ER RAPID COVID TEST in house testing  Pending  No results found for: SARSCOV2NAA   Regarding pertinent Chronic problems:     Hyperlipidemia -  on statins but does not take    HTN on lisinopril has not been compliant     DM 2 -  Lab Results  Component Value Date   HGBA1C 9.4 (A) 04/16/2019   PO meds only,       While in ER: To have progressive renal failure Renal ultrasound showed no evidence of hydronephrosis or stone More consistent with echogenic kidneys The following Work up has been ordered so far:  Orders Placed This Encounter  Procedures  . Comprehensive metabolic panel  . Lipase, blood  . CBC with Diff  . Urinalysis, Routine w reflex microscopic  . Diet NPO time specified  . Initiate Carrier Fluid Protocol  . Consult to hospitalist  ALL PATIENTS BEING ADMITTED/HAVING PROCEDURES NEED COVID-19 SCREENING  . Insert peripheral IV    Following Medications were ordered in ER: Medications  sodium chloride 0.9 %  bolus 1,000 mL (has no administration in time range)        Consult Orders  (From admission, onward)         Start     Ordered   04/17/19 2130  Consult to hospitalist  ALL PATIENTS BEING ADMITTED/HAVING PROCEDURES NEED COVID-19 SCREENING  Once    Comments: ALL PATIENTS BEING ADMITTED/HAVING PROCEDURES NEED COVID-19 SCREENING  Provider:  (Not yet assigned)  Question Answer Comment  Place call to: [facility]   Reason for Consult Admit      04/17/19 2129           Significant initial  Findings: Abnormal Labs Reviewed  COMPREHENSIVE METABOLIC PANEL - Abnormal; Notable for the following components:      Result Value   Glucose, Bld 182 (*)    BUN 45 (*)    Creatinine, Ser 3.72 (*)    Albumin 3.1 (*)    GFR calc non Af Amer 18 (*)    GFR calc Af Amer 21 (*)    All other components within normal limits  LIPASE, BLOOD - Abnormal; Notable for the following components:   Lipase 89 (*)    All other components within normal limits  CBC WITH DIFFERENTIAL/PLATELET - Abnormal; Notable for the following components:   Hemoglobin 11.9 (*)    HCT 38.5 (*)  Platelets 426 (*)    All other components within normal limits  URINALYSIS, ROUTINE W REFLEX MICROSCOPIC - Abnormal; Notable for the following components:   Glucose, UA 50 (*)    Hgb urine dipstick SMALL (*)    Protein, ur >=300 (*)    All other components within normal limits     Otherwise labs showing:    Recent Labs  Lab 04/16/19 1433 04/17/19 1643  NA 137 136  K 4.1 3.8  CO2 27 23  GLUCOSE 173* 182*  BUN 50* 45*  CREATININE 3.35* 3.72*  CALCIUM 9.9 9.6    Cr    Up from baseline see below Lab Results  Component Value Date   CREATININE 3.72 (H) 04/17/2019   CREATININE 3.35 (HH) 04/16/2019   CREATININE 1.23 08/29/2018    Recent Labs  Lab 04/16/19 1433 04/17/19 1643  AST 22 19  ALT 33 33  ALKPHOS 92 93  BILITOT 0.4 0.8  PROT 6.1 6.5  ALBUMIN 3.6* 3.1*   Lab Results  Component Value Date    CALCIUM 9.6 04/17/2019    WBC      Component Value Date/Time   WBC 8.1 04/17/2019 1643   ANC    Component Value Date/Time   NEUTROABS 5.2 04/17/2019 1643   NEUTROABS 4.3 03/26/2018 1104   ALC No components found for: LYMPHAB    Plt: Lab Results  Component Value Date   PLT 426 (H) 04/17/2019     Lactic Acid, Venous No results found for: LATICACIDVEN     COVID-19 Labs  No results for input(s): DDIMER, FERRITIN, LDH, CRP in the last 72 hours.  No results found for: SARSCOV2NAA   HG/HCT  stable,       Component Value Date/Time   HGB 11.9 (L) 04/17/2019 1643   HGB 12.6 (L) 03/26/2018 1104   HCT 38.5 (L) 04/17/2019 1643   HCT 40.5 03/26/2018 1104    Recent Labs  Lab 04/17/19 1643  LIPASE 89*   No results for input(s): AMMONIA in the last 168 hours.  No components found for: LABALBU     ECG personally reviewed: Heart rate 85 QTc 434 No evidence of ischemic changes appears to be in sinus rhythm   DM  labs:  HbA1C: Recent Labs    08/29/18 0949 04/16/19 1428  HGBA1C 14.0* 9.4*         UA  no evidence of UTI     Urine analysis:    Component Value Date/Time   COLORURINE YELLOW 04/17/2019 1953   APPEARANCEUR CLEAR 04/17/2019 1953   LABSPEC 1.015 04/17/2019 1953   PHURINE 5.0 04/17/2019 1953   GLUCOSEU 50 (A) 04/17/2019 1953   HGBUR SMALL (A) 04/17/2019 1953   BILIRUBINUR NEGATIVE 04/17/2019 1953   KETONESUR NEGATIVE 04/17/2019 1953   PROTEINUR >=300 (A) 04/17/2019 1953   NITRITE NEGATIVE 04/17/2019 1953   LEUKOCYTESUR NEGATIVE 04/17/2019 1953       Ordered   Renal US ordered    ED Triage Vitals  Enc Vitals Group     BP 04/17/19 1622 126/87     Pulse Rate 04/17/19 1622 95     Resp 04/17/19 1622 17     Temp 04/17/19 1622 98.6 F (37 C)     Temp Source 04/17/19 1622 Oral     SpO2 04/17/19 1622 100 %     Weight --      Height --      Head Circumference --      Peak Flow --  Pain Score 04/17/19 1623 0     Pain Loc --      Pain  Edu? --      Excl. in [place]? --   TMAX(24)@       Latest  Blood pressure (!) 143/94, pulse 86, temperature 98.6 F (37 C), temperature source Oral, resp. rate 16, SpO2 100 %.     Hospitalist was called for admission for aki and dehydration   Review of Systems:    Pertinent positives include: nausea, vomiting, chest pain   Constitutional:  No weight loss, night sweats, Fevers, chills, fatigue, weight loss  HEENT:  No headaches, Difficulty swallowing,Tooth/dental problems,Sore throat,  No sneezing, itching, ear ache, nasal congestion, post nasal drip,  Cardio-vascular:  No chest pain, Orthopnea, PND, anasarca, dizziness, palpitations.no Bilateral lower extremity swelling  GI:  No heartburn, indigestion, abdominal pain,  diarrhea, change in bowel habits, loss of appetite, melena, blood in stool, hematemesis Resp:  no shortness of breath at rest. No dyspnea on exertion, No excess mucus, no productive cough, No non-productive cough, No coughing up of blood.No change in color of mucus.No wheezing. Skin:  no rash or lesions. No jaundice GU:  no dysuria, change in color of urine, no urgency or frequency. No straining to urinate.  No flank pain.  Musculoskeletal:  No joint pain or no joint swelling. No decreased range of motion. No back pain.  Psych:  No change in mood or affect. No depression or anxiety. No memory loss.  Neuro: no localizing neurological complaints, no tingling, no weakness, no double vision, no gait abnormality, no slurred speech, no confusion  All systems reviewed and apart from [place] all are negative  Past Medical History:   Past Medical History:  Diagnosis Date  . Diabetes mellitus without complication ([place])   . Hypertension       History reviewed. No pertinent surgical history.  Social History:  Ambulatory   independently       reports that he has quit smoking. His smoking use included cigarettes and cigars. He has never used smokeless tobacco. He  reports previous alcohol use. He reports that he does not use drugs.     Family History:   Family History  Problem Relation Age of Onset  . Diabetes Mother   . Diabetes Father   . Hypertension Father   . Cancer Father   . Heart failure Other     Allergies: No Known Allergies   Prior to Admission medications   Medication Sig Start Date End Date Taking? Authorizing Provider  glipiZIDE (GLUCOTROL) 5 MG tablet Take 1 tablet (5 mg total) by mouth 2 (two) times daily before a meal. 08/29/18 04/17/19 Yes Sagardia, Ines Bloomer, MD  lisinopril (PRINIVIL,ZESTRIL) 20 MG tablet Take 1 tablet (20 mg total) by mouth daily. 08/29/18  Yes Horald Pollen, MD  metFORMIN (GLUCOPHAGE) 1000 MG tablet Take 1 tablet (1,000 mg total) by mouth 2 (two) times daily with a meal. 08/29/18  Yes Sagardia, Ines Bloomer, MD  omeprazole (PRILOSEC) 40 MG capsule Take 1 capsule (40 mg total) by mouth daily. 04/16/19  Yes Sagardia, Ines Bloomer, MD  ondansetron (ZOFRAN ODT) 4 MG disintegrating tablet Take 1 tablet (4 mg total) by mouth every 8 (eight) hours as needed for nausea or vomiting. 04/16/19  Yes Sagardia, Ines Bloomer, MD  rosuvastatin (CRESTOR) 10 MG tablet Take 1 tablet (10 mg total) by mouth daily. Patient not taking: Reported on 04/16/2019 08/29/18   Horald Pollen, MD   Physical Exam:  Blood pressure (!) 143/94, pulse 86, temperature 98.6 F (37 C), temperature source Oral, resp. rate 16, SpO2 100 %. 1. General:  in No  Acute distress   well  -appearing 2. Psychological: Alert and  Oriented 3. Head/ENT:    Dry Mucous Membranes                          Head Non traumatic, neck supple                           Poor Dentition 4. SKIN:  decreased Skin turgor,  Skin clean Dry and intact no rash 5. Heart: Regular rate and rhythm no  Murmur, no Rub or gallop 6. Lungs:  Clear to auscultation bilaterally, no wheezes or crackles   7. Abdomen: Soft, non-tender, Non distended  obese   bowel sounds present  8. Lower extremities: no clubbing, cyanosis, no  edema 9. Neurologically Grossly intact, moving all 4 extremities equally  10. MSK: Normal range of motion   All other LABS:     Recent Labs  Lab 04/16/19 1426 04/17/19 1643  WBC 11.2* 8.1  NEUTROABS  --  5.2  HGB 12.4 11.9*  HCT 38.6 38.5*  MCV 81.9 85.7  PLT  --  426*     Recent Labs  Lab 04/16/19 1433 04/17/19 1643  NA 137 136  K 4.1 3.8  CL 93* 99  CO2 27 23  GLUCOSE 173* 182*  BUN 50* 45*  CREATININE 3.35* 3.72*  CALCIUM 9.9 9.6     Recent Labs  Lab 04/16/19 1433 04/17/19 1643  AST 22 19  ALT 33 33  ALKPHOS 92 93  BILITOT 0.4 0.8  PROT 6.1 6.5  ALBUMIN 3.6* 3.1*       Cultures: No results found for: SDES, SPECREQUEST, CULT, REPTSTATUS   Radiological Exams on Admission: No results found.  Chart has been reviewed    Assessment/Plan  48 y.o. male with medical history significant of HTN, DM CKD, GERD Admitted for dehydration due gastroenteritis and AKI  Present on Admission:  . AKI (acute kidney injury) ([place]) -most likely secondary to rehydration obtain urine electrolytes renal ultrasound show no evidence of hydronephrosis if does not improve will need nephrology consult otherwise will probably benefit from nephrology follow-up as an outpatient given history of hypertension and diabetes  . Dehydration -we will rehydrate and monitor your electrolytes  . Chest pain unclear etiology will cycle cardiac enzymes and continue to monitor currently resolved obtain echogram and monitor on telemetry  . Type 2 diabetes mellitus with hyperglycemia, without long-term current use of insulin ([facility])  - Order Sensitive  SSI     -  check TSH and HgA1C  - Hold by mouth medications    . Essential hypertension -hold lisinopril  Other plan as per orders.  DVT prophylaxis:  SCD    Code Status:  FULL CODE as per patient  I had personally discussed CODE STATUS with patient   Family Communication:   Family not  at  Bedside  plan of care was discussed  with  mother  Disposition Plan:    To home once workup is complete and patient is stable                                      Consults called: none  Admission status:  ED Disposition    None      Obs     Level of care  tele  For 12H   Precautions: NONE   No active isolations  PPE: Used by the provider:   P100  eye Goggles,  Gloves      Kameela Leipold 04/18/2019, 12:30 AM    [facility]     after 2 AM please page floor coverage PA If 7AM-7PM, please contact the day team taking care of the patient using [email or web address]

## 2019-04-17 NOTE — Telephone Encounter (Signed)
Spoke to patient about lab results.  Acute renal failure with diabetes.  Advised to go to the emergency room today.

## 2019-04-18 ENCOUNTER — Other Ambulatory Visit: Payer: Self-pay

## 2019-04-18 ENCOUNTER — Observation Stay (HOSPITAL_COMMUNITY): Payer: Self-pay

## 2019-04-18 DIAGNOSIS — E785 Hyperlipidemia, unspecified: Secondary | ICD-10-CM | POA: Diagnosis present

## 2019-04-18 DIAGNOSIS — R079 Chest pain, unspecified: Secondary | ICD-10-CM

## 2019-04-18 LAB — CBC
HCT: 35.3 % — ABNORMAL LOW (ref 39.0–52.0)
Hemoglobin: 11 g/dL — ABNORMAL LOW (ref 13.0–17.0)
MCH: 26.9 pg (ref 26.0–34.0)
MCHC: 31.2 g/dL (ref 30.0–36.0)
MCV: 86.3 fL (ref 80.0–100.0)
Platelets: 172 K/uL (ref 150–400)
RBC: 4.09 MIL/uL — ABNORMAL LOW (ref 4.22–5.81)
RDW: 13.2 % (ref 11.5–15.5)
WBC: 8.1 K/uL (ref 4.0–10.5)
nRBC: 0 % (ref 0.0–0.2)

## 2019-04-18 LAB — LIPID PANEL
Cholesterol: 390 mg/dL — ABNORMAL HIGH (ref 0–200)
HDL: 34 mg/dL — ABNORMAL LOW (ref >40)
LDL Cholesterol: 293 mg/dL — ABNORMAL HIGH (ref 0–99)
Total CHOL/HDL Ratio: 11.5 RATIO
Triglycerides: 313 mg/dL — ABNORMAL HIGH (ref <150)
VLDL: 63 mg/dL — ABNORMAL HIGH (ref 0–40)

## 2019-04-18 LAB — PHOSPHORUS: Phosphorus: 3.2 mg/dL (ref 2.5–4.6)

## 2019-04-18 LAB — COMPREHENSIVE METABOLIC PANEL
ALT: 26 U/L (ref 0–44)
AST: 17 U/L (ref 15–41)
Albumin: 2.8 g/dL — ABNORMAL LOW (ref 3.5–5.0)
Alkaline Phosphatase: 76 U/L (ref 38–126)
Anion gap: 13 (ref 5–15)
BUN: 43 mg/dL — ABNORMAL HIGH (ref 6–20)
CO2: 21 mmol/L — ABNORMAL LOW (ref 22–32)
Calcium: 8.9 mg/dL (ref 8.9–10.3)
Chloride: 103 mmol/L (ref 98–111)
Creatinine, Ser: 3.08 mg/dL — ABNORMAL HIGH (ref 0.61–1.24)
GFR calc Af Amer: 26 mL/min — ABNORMAL LOW (ref >60)
GFR calc non Af Amer: 23 mL/min — ABNORMAL LOW (ref >60)
Glucose, Bld: 141 mg/dL — ABNORMAL HIGH (ref 70–99)
Potassium: 3.8 mmol/L (ref 3.5–5.1)
Sodium: 137 mmol/L (ref 135–145)
Total Bilirubin: 0.5 mg/dL (ref 0.3–1.2)
Total Protein: 5.8 g/dL — ABNORMAL LOW (ref 6.5–8.1)

## 2019-04-18 LAB — HIV ANTIBODY (ROUTINE TESTING W REFLEX): HIV Screen 4th Generation wRfx: NONREACTIVE

## 2019-04-18 LAB — GLUCOSE, CAPILLARY
Glucose-Capillary: 216 mg/dL — ABNORMAL HIGH (ref 70–99)
Glucose-Capillary: 136 mg/dL — ABNORMAL HIGH (ref 70–99)
Glucose-Capillary: 126 mg/dL — ABNORMAL HIGH (ref 70–99)
Glucose-Capillary: 111 mg/dL — ABNORMAL HIGH (ref 70–99)

## 2019-04-18 LAB — TROPONIN I (HIGH SENSITIVITY)
Troponin I (High Sensitivity): 7 ng/L (ref <18)
Troponin I (High Sensitivity): 7 ng/L (ref <18)

## 2019-04-18 LAB — MAGNESIUM: Magnesium: 1.6 mg/dL — ABNORMAL LOW (ref 1.7–2.4)

## 2019-04-18 LAB — SARS CORONAVIRUS 2 (TAT 6-24 HRS): SARS Coronavirus 2: NEGATIVE

## 2019-04-18 LAB — ECHOCARDIOGRAM COMPLETE

## 2019-04-18 LAB — TSH: TSH: 1.159 u[IU]/mL (ref 0.350–4.500)

## 2019-04-18 MED ORDER — MAGNESIUM SULFATE 4 GM/100ML IV SOLN
4.0000 g | Freq: Once | INTRAVENOUS | Status: AC
  Administered 2019-04-18: 08:00:00 4 g via INTRAVENOUS
  Filled 2019-04-18: qty 100

## 2019-04-18 MED ORDER — AMLODIPINE BESYLATE 10 MG PO TABS
10.0000 mg | ORAL_TABLET | Freq: Every day | ORAL | Status: DC
  Administered 2019-04-19: 09:00:00 10 mg via ORAL
  Filled 2019-04-18: qty 1

## 2019-04-18 MED ORDER — GLUCERNA SHAKE PO LIQD
237.0000 mL | Freq: Two times a day (BID) | ORAL | Status: DC
  Administered 2019-04-18 – 2019-04-19 (×3): 237 mL via ORAL
  Filled 2019-04-18 (×4): qty 237

## 2019-04-18 MED ORDER — SODIUM CHLORIDE 0.9 % IV SOLN
INTRAVENOUS | Status: DC
  Administered 2019-04-18: 03:00:00 via INTRAVENOUS

## 2019-04-18 MED ORDER — HYDRALAZINE HCL 10 MG PO TABS
10.0000 mg | ORAL_TABLET | Freq: Three times a day (TID) | ORAL | Status: DC
  Administered 2019-04-18 – 2019-04-19 (×3): 10 mg via ORAL
  Filled 2019-04-18 (×3): qty 1

## 2019-04-18 MED ORDER — ROSUVASTATIN CALCIUM 20 MG PO TABS
20.0000 mg | ORAL_TABLET | Freq: Every day | ORAL | Status: DC
  Administered 2019-04-18: 17:00:00 20 mg via ORAL
  Filled 2019-04-18: qty 1

## 2019-04-18 MED ORDER — ACETAMINOPHEN 325 MG PO TABS: 650.0000 mg | ORAL_TABLET | Freq: Four times a day (QID) | ORAL | Status: DC | PRN

## 2019-04-18 MED ORDER — AMLODIPINE BESYLATE 5 MG PO TABS
5.0000 mg | ORAL_TABLET | Freq: Every day | ORAL | Status: DC
  Administered 2019-04-18: 14:00:00 5 mg via ORAL
  Filled 2019-04-18: qty 1

## 2019-04-18 MED ORDER — ENSURE ENLIVE PO LIQD: 237.0000 mL | Freq: Two times a day (BID) | ORAL | Status: DC

## 2019-04-18 MED ORDER — GLUCERNA PO LIQD: 237.0000 mL | Freq: Two times a day (BID) | ORAL | Status: DC

## 2019-04-18 MED ORDER — ACETAMINOPHEN 650 MG RE SUPP: 650.0000 mg | Freq: Four times a day (QID) | RECTAL | Status: DC | PRN

## 2019-04-18 MED ORDER — SODIUM CHLORIDE 0.9 % IV SOLN
INTRAVENOUS | Status: DC
  Administered 2019-04-18 – 2019-04-19 (×4): via INTRAVENOUS

## 2019-04-18 MED ORDER — ONDANSETRON HCL 4 MG PO TABS: 4.0000 mg | ORAL_TABLET | Freq: Four times a day (QID) | ORAL | Status: DC | PRN

## 2019-04-18 MED ORDER — PANTOPRAZOLE SODIUM 40 MG PO TBEC
40.0000 mg | DELAYED_RELEASE_TABLET | Freq: Every day | ORAL | Status: DC
  Administered 2019-04-18 – 2019-04-19 (×2): 40 mg via ORAL
  Filled 2019-04-18 (×2): qty 1

## 2019-04-18 MED ORDER — HYDRALAZINE HCL 20 MG/ML IJ SOLN
10.0000 mg | Freq: Four times a day (QID) | INTRAMUSCULAR | Status: DC | PRN
  Administered 2019-04-18 – 2019-04-19 (×3): 10 mg via INTRAVENOUS
  Filled 2019-04-18 (×3): qty 1

## 2019-04-18 MED ORDER — ENOXAPARIN SODIUM 30 MG/0.3ML ~~LOC~~ SOLN
30.0000 mg | SUBCUTANEOUS | Status: DC
  Administered 2019-04-18: 11:00:00 30 mg via SUBCUTANEOUS
  Filled 2019-04-18 (×2): qty 0.3

## 2019-04-18 MED ORDER — MAGNESIUM SULFATE 50 % IJ SOLN: 4.0000 g | Freq: Once | INTRAVENOUS | Status: DC

## 2019-04-18 MED ORDER — ONDANSETRON HCL 4 MG/2ML IJ SOLN: 4.0000 mg | Freq: Four times a day (QID) | INTRAMUSCULAR | Status: DC | PRN

## 2019-04-18 MED ORDER — HYDROCODONE-ACETAMINOPHEN 5-325 MG PO TABS: 1.0000 | ORAL_TABLET | ORAL | Status: DC | PRN

## 2019-04-18 MED ORDER — INSULIN ASPART 100 UNIT/ML ~~LOC~~ SOLN: 0.0000 [IU] | Freq: Every day | SUBCUTANEOUS | Status: DC

## 2019-04-18 MED ORDER — INSULIN ASPART 100 UNIT/ML ~~LOC~~ SOLN
0.0000 [IU] | Freq: Three times a day (TID) | SUBCUTANEOUS | Status: DC
  Administered 2019-04-18: 3 [IU] via SUBCUTANEOUS
  Administered 2019-04-18 (×2): 1 [IU] via SUBCUTANEOUS

## 2019-04-18 NOTE — Progress Notes (Signed)
  Echocardiogram 2D Echocardiogram has been performed.  Darlina Sicilian M 04/18/2019, 8:02 AM

## 2019-04-18 NOTE — Progress Notes (Signed)
BP 177/104 2 hours after scheduled dose of Amlodipine 5mg  was given, at that time BP was 177/106. MD made aware of elevated BP after medication. New orders placed for Hydralazine PO scheduled and PRN IV. Both doses given. Will recheck BP in about an hour to assess for improvement.   Kaleth Koy, [place] Din

## 2019-04-18 NOTE — Progress Notes (Signed)
[facility]                                                                              Patient Demographics  Tony Schmitt, is a 48 y.o. male, DOB - Jan 22, 1971, [id number]  Admit date - 04/17/2019   Admitting Physician Toy Baker, MD  Outpatient Primary MD for the patient is Sagardia, Ines Bloomer, MD  Outpatient specialists:   LOS - 0  days   Medical records reviewed and are as summarized below:    No chief complaint on file.      Brief summary   Patient is a 48 year old male with a history of hypertension, diabetes mellitus, CKD, stage III, creatinine 1.2 on 08/29/2018, GERD presented from his PCP due to abnormal labs.  Patient reported that he was having nausea and vomiting for past 1 week and was not even able to tolerate liquids.  Patient reported no diarrhea, fevers, chills or abdominal pain.  He had regular visit to his PCP and was found to have creatinine of 3.2 and was recommended to go to ED.  Patient also reported heaviness in his chest on the morning of admission which had resolved. Of note, patient reported that he and his sister were on a eating spray and were eating multiple cuisines, his favorite [place], [place] soul food and [place].  Per patient, his sister did not have any nausea or vomiting. COVID-19 test negative  Assessment & Plan    Principal Problem:   AKI (acute kidney injury) ([place]) with metabolic acidosis -Prerenal most likely due to acute nausea and vomiting in the last 1 week -Creatinine 3.7 on admission.  Patient was placed on aggressive IV fluid hydration -Renal ultrasound showed medical renal disease but no hydronephrosis or obstruction -Continue IV fluid hydration, 125 cc/ hour  Active Problems:  Hyperlipidemia -Cholesterol 390, HDL 34, LDL 293, triglycerides 313, patient had been on binge eating spree however he does have a history of hyperlipidemia. -Counseled patient on healthy lifestyle, low-fat diet,  increased Crestor 20 mg daily -Recommended follow-up lipid panel in 6-8 weeks  Chest pain atypical - possibly GERD, resolved, cardiac enzymes negative so far -2D echo showed EF of 60 to 65%, impaired relaxation -Continue PPI    Type 2 diabetes mellitus with hyperglycemia, without long-term current use of insulin (HCC) -Uncontrolled, hemoglobin A1c 9.4 -Continue sliding scale insulin while inpatient -Hold oral hypoglycemics    Essential hypertension -BP elevated, hold lisinopril -Placed on Norvasc  Hypomagnesemia -Replaced  Code Status: Full CODE STATUS DVT Prophylaxis:  Lovenox  Family Communication: Discussed all imaging results, lab results, explained to the patient   Disposition Plan: Hopefully DC home tomorrow if creatinine improving and tolerating diet  Time Spent in minutes   35 minutes  Procedures:  Renal ultrasound  Consultants:   None  Antimicrobials:   Anti-infectives (From admission, onward)   None         Medications  Scheduled Meds: . enoxaparin (LOVENOX) injection  30 mg Subcutaneous Q24H  . feeding supplement (GLUCERNA SHAKE)  237 mL Oral BID BM  . insulin aspart  0-5 Units Subcutaneous QHS  . insulin aspart  0-9  Units Subcutaneous TID WC  . pantoprazole  40 mg Oral Daily  . rosuvastatin  20 mg Oral q1800   Continuous Infusions: . sodium chloride 125 mL/hr at 04/18/19 1140   PRN Meds:.acetaminophen **OR** acetaminophen, HYDROcodone-acetaminophen, ondansetron **OR** ondansetron (ZOFRAN) IV      Subjective:   Tony Schmitt was seen and examined today.  Feeling better today with IV fluids, denies any vomiting or diarrhea, no fevers or chills. Patient denies dizziness, chest pain, shortness of breath, abdominal pain. No acute events overnight.    Objective:   Vitals:   04/17/19 2230 04/18/19 0130 04/18/19 0204 04/18/19 0419  BP: (!) 150/97  (!) 160/94 (!) 169/87  Pulse: 84 83 91 89  Resp:  14 16 18   Temp:   98.4 F (36.9 C) (!) 97.4  F (36.3 C)  TempSrc:   Oral Oral  SpO2: 100% 99% 100% 100%    Intake/Output Summary (Last 24 hours) at 04/18/2019 1236 Last data filed at 04/18/2019 0500 Gross per 24 hour  Intake 219.13 ml  Output -  Net 219.13 ml     Wt Readings from Last 3 Encounters:  04/16/19 67.3 kg  08/29/18 72.3 kg  08/02/18 59 kg     Exam  General: Alert and oriented x 3, NAD  Eyes:   HEENT:  Atraumatic, normocephalic, normal oropharynx  Cardiovascular: S1 S2 auscultated, no murmurs, RRR  Respiratory: Clear to auscultation bilaterally, no wheezing, rales or rhonchi  Gastrointestinal: Soft, nontender, nondistended, + bowel sounds  Ext: no pedal edema bilaterally  Neuro: No new FND  Musculoskeletal: No digital cyanosis, clubbing  Skin: No rashes  Psych: Normal affect and demeanor, alert and oriented x3    Data Reviewed:  I have personally reviewed following labs and imaging studies  Micro Results Recent Results (from the past 240 hour(s))  SARS CORONAVIRUS 2 (TAT 6-24 HRS) Nasopharyngeal Nasopharyngeal Swab     Status: None   Collection Time: 04/17/19 10:27 PM   Specimen: Nasopharyngeal Swab  Result Value Ref Range Status   SARS Coronavirus 2 NEGATIVE NEGATIVE Final    Comment: (NOTE) SARS-CoV-2 target nucleic acids are NOT DETECTED. The SARS-CoV-2 RNA is generally detectable in upper and lower respiratory specimens during the acute phase of infection. Negative results do not preclude SARS-CoV-2 infection, do not rule out co-infections with other pathogens, and should not be used as the sole basis for treatment or other patient management decisions. Negative results must be combined with clinical observations, patient history, and epidemiological information. The expected result is Negative. Fact Sheet for Patients: SugarRoll.be Fact Sheet for Healthcare Providers: https://www.woods-mathews.com/ This test is not yet approved or cleared  by the [place] FDA and  has been authorized for detection and/or diagnosis of SARS-CoV-2 by FDA under an Emergency Use Authorization (EUA). This EUA will remain  in effect (meaning this test can be used) for the duration of the COVID-19 declaration under Section 56 4(b)(1) of the Act, 21 U.S.C. section 360bbb-3(b)(1), unless the authorization is terminated or revoked sooner. Performed at [facility], [place] [address] [place], [place] [address]     Radiology Reports US Renal  Result Date: 04/17/2019 CLINICAL DATA:  48 year old male with acute renal insufficiency. EXAM: RENAL / URINARY TRACT ULTRASOUND COMPLETE COMPARISON:  None. FINDINGS: Right Kidney: Renal measurements: 12.1 x 5.8 x 6.6 cm = volume: 206 mL. There is diffuse increased renal echogenicity. There is a 1.4 x 1.6 x 1.2 cm inferior pole cyst. No hydronephrosis or shadowing stone. Left  Kidney: Renal measurements: 11.8 x 4.8 x 6.2 cm = volume: 155 mL. Diffuse increased renal parenchymal echogenicity. No hydronephrosis or shadowing stone. Bladder: The bladder is only partially distended. Mild thickened appearance of the bladder wall likely related to underdistention. IMPRESSION: Echogenic kidneys likely related to medical renal disease. No hydronephrosis or shadowing stone. Electronically Signed   By: Anner Crete M.D.   On: 04/17/2019 23:17    Lab Data:  CBC: Recent Labs  Lab 04/16/19 1426 04/17/19 1643 04/18/19 0413  WBC 11.2* 8.1 8.1  NEUTROABS  --  5.2  --   HGB 12.4 11.9* 11.0*  HCT 38.6 38.5* 35.3*  MCV 81.9 85.7 86.3  PLT  --  426* [id number]   Basic Metabolic Panel: Recent Labs  Lab 04/16/19 1433 04/17/19 1643 04/18/19 0413  NA 137 136 137  K 4.1 3.8 3.8  CL 93* 99 103  CO2 27 23 21*  GLUCOSE 173* 182* 141*  BUN 50* 45* 43*  CREATININE 3.35* 3.72* 3.08*  CALCIUM 9.9 9.6 8.9  MG  --   --  1.6*  PHOS  --   --  3.2   GFR: Estimated Creatinine Clearance: 24.3 mL/min (A) (by C-G formula based on  SCr of 3.08 mg/dL (H)). Liver Function Tests: Recent Labs  Lab 04/16/19 1433 04/17/19 1643 04/18/19 0413  AST 22 19 17   ALT 33 33 26  ALKPHOS 92 93 76  BILITOT 0.4 0.8 0.5  PROT 6.1 6.5 5.8*  ALBUMIN 3.6* 3.1* 2.8*   Recent Labs  Lab 04/17/19 1643  LIPASE 89*   No results for input(s): AMMONIA in the last 168 hours. Coagulation Profile: No results for input(s): INR, PROTIME in the last 168 hours. Cardiac Enzymes: No results for input(s): CKTOTAL, CKMB, CKMBINDEX, TROPONINI in the last 168 hours. BNP (last 3 results) No results for input(s): PROBNP in the last 8760 hours. HbA1C: Recent Labs    04/16/19 1428  HGBA1C 9.4*   CBG: Recent Labs  Lab 04/18/19 0722 04/18/19 1128  GLUCAP 216* 136*   Lipid Profile: Recent Labs    04/18/19 0413  CHOL 390*  HDL 34*  LDLCALC 293*  TRIG 313*  CHOLHDL 11.5   Thyroid Function Tests: Recent Labs    04/18/19 0413  TSH 1.159   Anemia Panel: No results for input(s): VITAMINB12, FOLATE, FERRITIN, TIBC, IRON, RETICCTPCT in the last 72 hours. Urine analysis:    Component Value Date/Time   COLORURINE YELLOW 04/17/2019 1953   APPEARANCEUR CLEAR 04/17/2019 1953   LABSPEC 1.015 04/17/2019 1953   PHURINE 5.0 04/17/2019 1953   GLUCOSEU 50 (A) 04/17/2019 1953   HGBUR SMALL (A) 04/17/2019 1953   BILIRUBINUR NEGATIVE 04/17/2019 [place] NEGATIVE 04/17/2019 1953   PROTEINUR >=300 (A) 04/17/2019 1953   NITRITE NEGATIVE 04/17/2019 1953   LEUKOCYTESUR NEGATIVE 04/17/2019 1953     Tony Schmitt M.D. [facility] 04/18/2019, 12:36 PM  Pager: [phone] Between 7am to 7pm - call Pager - [phone]  After 7pm go to [email or web address] - password [email or web address]  Call night coverage person covering after 7pm

## 2019-04-19 LAB — BASIC METABOLIC PANEL
Anion gap: 6 (ref 5–15)
BUN: 25 mg/dL — ABNORMAL HIGH (ref 6–20)
CO2: 23 mmol/L (ref 22–32)
Calcium: 8.6 mg/dL — ABNORMAL LOW (ref 8.9–10.3)
Chloride: 107 mmol/L (ref 98–111)
Creatinine, Ser: 1.62 mg/dL — ABNORMAL HIGH (ref 0.61–1.24)
GFR calc Af Amer: 57 mL/min — ABNORMAL LOW (ref >60)
GFR calc non Af Amer: 49 mL/min — ABNORMAL LOW (ref >60)
Glucose, Bld: 112 mg/dL — ABNORMAL HIGH (ref 70–99)
Potassium: 3.8 mmol/L (ref 3.5–5.1)
Sodium: 136 mmol/L (ref 135–145)

## 2019-04-19 LAB — GLUCOSE, CAPILLARY: Glucose-Capillary: 115 mg/dL — ABNORMAL HIGH (ref 70–99)

## 2019-04-19 MED ORDER — ONDANSETRON 4 MG PO TBDP: 4.0000 mg | ORAL_TABLET | Freq: Three times a day (TID) | ORAL | 0 refills | Status: DC | PRN

## 2019-04-19 MED ORDER — LISINOPRIL 20 MG PO TABS: 20.0000 mg | ORAL_TABLET | Freq: Every day | ORAL | 3 refills | Status: DC

## 2019-04-19 MED ORDER — ROSUVASTATIN CALCIUM 20 MG PO TABS: 20.0000 mg | ORAL_TABLET | Freq: Every day | ORAL | 3 refills | Status: DC

## 2019-04-19 MED ORDER — AMLODIPINE BESYLATE 10 MG PO TABS: 10.0000 mg | ORAL_TABLET | Freq: Every day | ORAL | 3 refills | Status: DC

## 2019-04-19 NOTE — Discharge Summary (Signed)
Physician Discharge Summary   Patient ID: Tony Schmitt MRN: [id number] DOB/AGE: 1971-02-01 48 y.o.  Admit date: 04/17/2019 Discharge date: 04/19/2019  Primary Care Physician:  Horald Pollen, MD   Recommendations for Outpatient Follow-up:  1. Follow up with PCP in 1-2 weeks 2. Patient recommended to follow-up be met for renal function in 1 week at the follow-up appointment.  Lipid panel in 6 to 8 weeks to follow-up  Home Health:  Equipment/Devices:   Discharge Condition: stable  CODE STATUS: FULL  Diet recommendation: Carb modified diet, heart healthy   Discharge Diagnoses:    . AKI (acute kidney injury) ([place]) . Dehydration . Atypical chest pain . Hyperlipidemia . Type 2 diabetes mellitus with hyperglycemia, without long-term current use of insulin ([place]) . Essential hypertension . Hypomagnesemia   Consults: None    Allergies:  No Known Allergies   DISCHARGE MEDICATIONS: Allergies as of 04/19/2019   No Known Allergies     Medication List    TAKE these medications   amLODipine 10 MG tablet Commonly known as: NORVASC Take 1 tablet (10 mg total) by mouth daily.   glipiZIDE 5 MG tablet Commonly known as: GLUCOTROL Take 1 tablet (5 mg total) by mouth 2 (two) times daily before a meal.   lisinopril 20 MG tablet Commonly known as: ZESTRIL Take 1 tablet (20 mg total) by mouth daily. Start taking on: April 20, 2019   metFORMIN 1000 MG tablet Commonly known as: GLUCOPHAGE Take 1 tablet (1,000 mg total) by mouth 2 (two) times daily with a meal.   omeprazole 40 MG capsule Commonly known as: PRILOSEC Take 1 capsule (40 mg total) by mouth daily.   ondansetron 4 MG disintegrating tablet Commonly known as: Zofran ODT Take 1 tablet (4 mg total) by mouth every 8 (eight) hours as needed for nausea or vomiting.   rosuvastatin 20 MG tablet Commonly known as: Crestor Take 1 tablet (20 mg total) by mouth daily. What changed:   medication  strength  how much to take        Brief H and P: For complete details please refer to admission H and P, but in briefPatient is a 48 year old male with a history of hypertension, diabetes mellitus, CKD, stage III, creatinine 1.2 on 08/29/2018, GERD presented from his PCP due to abnormal labs.  Patient reported that he was having nausea and vomiting for past 1 week and was not even able to tolerate liquids.  Patient reported no diarrhea, fevers, chills or abdominal pain.  He had regular visit to his PCP and was found to have creatinine of 3.2 and was recommended to go to ED.  Patient also reported heaviness in his chest on the morning of admission which had resolved. Of note, patient reported that he and his sister were on a eating spree and were eating multiple cuisines, his favorite [place], [place] soul food and [place].  Per patient, his sister did not have any nausea or vomiting. COVID-19 test negative  Hospital Course:   AKI (acute kidney injury) ([place]) with metabolic acidosis -Prerenal most likely due to acute nausea and vomiting in the last 1 week -Creatinine 3.7 on admission.  Patient was placed on aggressive IV fluid hydration -Renal ultrasound showed medical renal disease but no hydronephrosis or obstruction -Creatinine improved to 1.6 at the time of discharge, close to his baseline -Patient recommended to hold lisinopril for another day and stay hydrated.  Hyperlipidemia -Cholesterol 390, HDL 34, LDL 293, triglycerides 313, patient had been on  binge eating spree however he does have a history of hyperlipidemia. -Counseled patient on healthy lifestyle, low-fat diet, increased Crestor 20 mg daily -Recommended follow-up lipid panel in 6-8 weeks  Chest pain atypical - possibly GERD, resolved, cardiac enzymes negative so far -2D echo showed EF of 60 to 65%, impaired relaxation -Continue PPI    Type 2 diabetes mellitus with hyperglycemia, without long-term current use of  insulin (HCC) -Uncontrolled, hemoglobin A1c 9.4 -Patient recommended to eat healthy, low-carb diet, exercise -Resume oral hypoglycemics and follow closely with PCP    Essential hypertension -Placed on Norvasc 10 mg daily, patient recommended to hold lisinopril for 1 more day due to resolving AKI  Hypomagnesemia -Replaced  Day of Discharge S: Feels better, no nausea vomiting or diarrhea.  Tolerating diet without any difficulty.  No fevers  BP (!) 148/78   Pulse 98   Temp 98.6 F (37 C) (Oral)   Resp 16   SpO2 100%   Physical Exam: General: Alert and awake oriented x3 not in any acute distress. HEENT: anicteric sclera, pupils reactive to light and accommodation CVS: S1-S2 clear no murmur rubs or gallops Chest: clear to auscultation bilaterally, no wheezing rales or rhonchi Abdomen: soft nontender, nondistended, normal bowel sounds Extremities: no cyanosis, clubbing or edema noted bilaterally Neuro: Cranial nerves II-XII intact, no focal neurological deficits   The results of significant diagnostics from this hospitalization (including imaging, microbiology, ancillary and laboratory) are listed below for reference.      Procedures/Studies:  US Renal  Result Date: April 19, 2019 CLINICAL DATA:  48 year old male with acute renal insufficiency. EXAM: RENAL / URINARY TRACT ULTRASOUND COMPLETE COMPARISON:  None. FINDINGS: Right Kidney: Renal measurements: 12.1 x 5.8 x 6.6 cm = volume: 206 mL. There is diffuse increased renal echogenicity. There is a 1.4 x 1.6 x 1.2 cm inferior pole cyst. No hydronephrosis or shadowing stone. Left Kidney: Renal measurements: 11.8 x 4.8 x 6.2 cm = volume: 155 mL. Diffuse increased renal parenchymal echogenicity. No hydronephrosis or shadowing stone. Bladder: The bladder is only partially distended. Mild thickened appearance of the bladder wall likely related to underdistention. IMPRESSION: Echogenic kidneys likely related to medical renal disease. No  hydronephrosis or shadowing stone. Electronically Signed   By: Anner Crete M.D.   On: 04/19/2019 23:17       LAB RESULTS: Basic Metabolic Panel: Recent Labs  Lab 04/18/19 0413 04/19/19 0457  NA 137 136  K 3.8 3.8  CL 103 107  CO2 21* 23  GLUCOSE 141* 112*  BUN 43* 25*  CREATININE 3.08* 1.62*  CALCIUM 8.9 8.6*  MG 1.6*  --   PHOS 3.2  --    Liver Function Tests: Recent Labs  Lab 19-Apr-2019 1643 04/18/19 0413  AST 19 17  ALT 33 26  ALKPHOS 93 76  BILITOT 0.8 0.5  PROT 6.5 5.8*  ALBUMIN 3.1* 2.8*   Recent Labs  Lab April 19, 2019 1643  LIPASE 89*   No results for input(s): AMMONIA in the last 168 hours. CBC: Recent Labs  Lab 04/19/2019 1643 04/18/19 0413  WBC 8.1 8.1  NEUTROABS 5.2  --   HGB 11.9* 11.0*  HCT 38.5* 35.3*  MCV 85.7 86.3  PLT 426* 172   Cardiac Enzymes: No results for input(s): CKTOTAL, CKMB, CKMBINDEX, TROPONINI in the last 168 hours. BNP: Invalid input(s): POCBNP CBG: Recent Labs  Lab 04/18/19 2106 04/19/19 0725  GLUCAP 111* 115*      Disposition and Follow-up: Discharge Instructions    Diet Carb Modified  Complete by: As directed    Discharge instructions   Complete by: As directed    Please start Lisinopril tomorrow 9/14 and get your labs checked with your doctor in 7-10 days. Eat healthy and stay hydrated!   Increase activity slowly   Complete by: As directed        DISPOSITION: [facility]    Horald Pollen, MD. Schedule an appointment as soon as possible for a visit in 2 week(s).   Specialty: Internal Medicine Why: labs to be checked: BMET at follow up appt (for kidney function). Lipid panel in 6-8 weeks.   Contact information: [place] [address] [phone]            Time coordinating discharge:  35 mins   Signed:   Estill Cotta M.D. [facility] 04/19/2019, 12:26 PM

## 2019-04-19 NOTE — Progress Notes (Signed)
Nutrition Brief Note RD working remotely.   Patient identified on the Malnutrition Screening Tool (MST) Report  Wt Readings from Last 15 Encounters:  04/16/19 67.3 kg  08/29/18 72.3 kg  08/02/18 59 kg  03/27/18 68.5 kg  03/26/18 67.9 kg    BMI: 25.5 kg/m2. Patient meets criteria for overweight status based on current BMI. Current weight is 148 lb and weight on 1/24 was 159 lb. This indicates 11 lb weight loss (7% body weight) in the past 7.5 months. Unsure if weight loss occurred more acutely. Skin WDL.   Current diet order is Heart Healthy/Carb Modified and he consumed 100% of breakfast and lunch yesterday. Labs and medications reviewed.   MD note from yesterday stated plan for d/c home today if creatinine improved and patient tolerating diet. Discharge order entered earlier this morning; discharge summary not yet entered.   No nutrition interventions warranted at this time. If nutrition issues arise, please consult RD.      Tony Matin, MS, RD, LDN, [facility] Inpatient Clinical Dietitian Pager # [phone] After hours/weekend pager # [phone]

## 2019-04-19 NOTE — Discharge Instructions (Signed)
Acute Kidney Injury, Adult ° °Acute kidney injury is a sudden worsening of kidney function. The kidneys are organs that have several jobs. They filter the blood to remove waste products and extra fluid. They also maintain a healthy balance of minerals and hormones in the body, which helps control blood pressure and keep bones strong. With this condition, your kidneys do not do their jobs as well as they should. °This condition ranges from mild to severe. Over time it may develop into long-lasting (chronic) kidney disease. Early detection and treatment may prevent acute kidney injury from developing into a chronic condition. °What are the causes? °Common causes of this condition include: °· A problem with blood flow to the kidneys. This may be caused by: °? Low blood pressure (hypotension) or shock. °? Blood loss. °? Heart and blood vessel (cardiovascular) disease. °? Severe burns. °? Liver disease. °· Direct damage to the kidneys. This may be caused by: °? Certain medicines. °? A kidney infection. °? Poisoning. °? Being around or in contact with toxic substances. °? A surgical wound. °? A hard, direct hit to the kidney area. °· A sudden blockage of urine flow. This may be caused by: °? Cancer. °? Kidney stones. °? An enlarged prostate in males. °What are the signs or symptoms? °Symptoms of this condition may not be obvious until the condition becomes severe. Symptoms of this condition can include: °· Tiredness (lethargy), or difficulty staying awake. °· Nausea or vomiting. °· Swelling (edema) of the face, legs, ankles, or feet. °· Problems with urination, such as: °? Abdominal pain, or pain along the side of your stomach (flank). °? Decreased urine production. °? Decrease in the force of urine flow. °· Muscle twitches and cramps, especially in the legs. °· Confusion or trouble concentrating. °· Loss of appetite. °· Fever. °How is this diagnosed? °This condition may be diagnosed with tests, including: °· Blood  tests. °· Urine tests. °· Imaging tests. °· A test in which a sample of tissue is removed from the kidneys to be examined under a microscope (kidney biopsy). °How is this treated? °Treatment for this condition depends on the cause and how severe the condition is. In mild cases, treatment may not be needed. The kidneys may heal on their own. In more severe cases, treatment will involve: °· Treating the cause of the kidney injury. This may involve changing any medicines you are taking or adjusting your dosage. °· Fluids. You may need specialized IV fluids to balance your body's needs. °· Having a catheter placed to drain urine and prevent blockages. °· Preventing problems from occurring. This may mean avoiding certain medicines or procedures that can cause further injury to the kidneys. °In some cases treatment may also require: °· A procedure to remove toxic wastes from the body (dialysis or continuous renal replacement therapy - CRRT). °· Surgery. This may be done to repair a torn kidney, or to remove the blockage from the urinary system. °Follow these instructions at home: °Medicines °· Take over-the-counter and prescription medicines only as told by your health care provider. °· Do not take any new medicines without your health care provider's approval. Many medicines can worsen your kidney damage. °· Do not take any vitamin and mineral supplements without your health care provider's approval. Many nutritional supplements can worsen your kidney damage. °Lifestyle °· If your health care provider prescribed changes to your diet, follow them. You may need to decrease the amount of protein you eat. °· Achieve and maintain a healthy   weight. If you need help with this, ask your health care provider. °· Start or continue an exercise plan. Try to exercise at least 30 minutes a day, 5 days a week. °· Do not use any tobacco products, such as cigarettes, chewing tobacco, and e-cigarettes. If you need help quitting, ask your  health care provider. °General instructions °· Keep track of your blood pressure. Report changes in your blood pressure as told by your health care provider. °· Stay up to date with immunizations. Ask your health care provider which immunizations you need. °· Keep all follow-up visits as told by your health care provider. This is important. °Where to find more information °· American Association of Kidney Patients: www.aakp.org °· National Kidney Foundation: www.kidney.org °· American Kidney Fund: www.akfinc.org °· Life Options Rehabilitation Program: °? www.lifeoptions.org °? www.kidneyschool.org °Contact a health care provider if: °· Your symptoms get worse. °· You develop new symptoms. °Get help right away if: °· You develop symptoms of worsening kidney disease, which include: °? Headaches. °? Abnormally dark or light skin. °? Easy bruising. °? Frequent hiccups. °? Chest pain. °? Shortness of breath. °? End of menstruation in women. °? Seizures. °? Confusion or altered mental status. °? Abdominal or back pain. °? Itchiness. °· You have a fever. °· Your body is producing less urine. °· You have pain or bleeding when you urinate. °Summary °· Acute kidney injury is a sudden worsening of kidney function. °· Acute kidney injury can be caused by problems with blood flow to the kidneys, direct damage to the kidneys, and sudden blockage of urine flow. °· Symptoms of this condition may not be obvious until it becomes severe. Symptoms may include edema, lethargy, confusion, nausea or vomiting, and problems passing urine. °· This condition can usually be diagnosed with blood tests, urine tests, and imaging tests. Sometimes a kidney biopsy is done to diagnose this condition. °· Treatment for this condition often involves treating the underlying cause. It is treated with fluids, medicines, dialysis, diet changes, or surgery. °This information is not intended to replace advice given to you by your health care provider. Make  sure you discuss any questions you have with your health care provider. °Document Released: 02/05/2011 Document Revised: 07/05/2017 Document Reviewed: 07/13/2016 °Elsevier Patient Education © 2020 [employer] ° °

## 2019-04-23 ENCOUNTER — Other Ambulatory Visit: Payer: Self-pay

## 2019-04-23 ENCOUNTER — Ambulatory Visit (INDEPENDENT_AMBULATORY_CARE_PROVIDER_SITE_OTHER): Payer: Self-pay | Admitting: Emergency Medicine

## 2019-04-23 ENCOUNTER — Encounter: Payer: Self-pay | Admitting: Emergency Medicine

## 2019-04-23 VITALS — BP 138/55 | HR 98 | Temp 98.7°F | Resp 16 | Ht 64.0 in | Wt 157.8 lb

## 2019-04-23 DIAGNOSIS — E785 Hyperlipidemia, unspecified: Secondary | ICD-10-CM

## 2019-04-23 DIAGNOSIS — I10 Essential (primary) hypertension: Secondary | ICD-10-CM

## 2019-04-23 DIAGNOSIS — E1169 Type 2 diabetes mellitus with other specified complication: Secondary | ICD-10-CM

## 2019-04-23 DIAGNOSIS — N183 Chronic kidney disease, stage 3 unspecified: Secondary | ICD-10-CM | POA: Insufficient documentation

## 2019-04-23 DIAGNOSIS — E1165 Type 2 diabetes mellitus with hyperglycemia: Secondary | ICD-10-CM

## 2019-04-23 DIAGNOSIS — E1159 Type 2 diabetes mellitus with other circulatory complications: Secondary | ICD-10-CM

## 2019-04-23 NOTE — Patient Instructions (Addendum)
   If you have lab work done today you will be contacted with your lab results within the next 2 weeks.  If you have not heard from us then please contact us. The fastest way to get your results is to register for My Chart.   IF you received an x-ray today, you will receive an invoice from [facility]. Please contact [facility] at [phone] with questions or concerns regarding your invoice.   IF you received labwork today, you will receive an invoice from [facility]. Please contact [facility] at [phone] with questions or concerns regarding your invoice.   Our billing staff will not be able to assist you with questions regarding bills from these companies.  You will be contacted with the lab results as soon as they are available. The fastest way to get your results is to activate your My Chart account. Instructions are located on the last page of this paperwork. If you have not heard from us regarding the results in 2 weeks, please contact this office.     Diabetes Mellitus and Nutrition, Adult When you have diabetes (diabetes mellitus), it is very important to have healthy eating habits because your blood sugar (glucose) levels are greatly affected by what you eat and drink. Eating healthy foods in the appropriate amounts, at about the same times every day, can help you:  Control your blood glucose.  Lower your risk of heart disease.  Improve your blood pressure.  Reach or maintain a healthy weight. Every person with diabetes is different, and each person has different needs for a meal plan. Your health care provider may recommend that you work with a diet and nutrition specialist (dietitian) to make a meal plan that is best for you. Your meal plan may vary depending on factors such as:  The calories you need.  The medicines you take.  Your weight.  Your blood glucose, blood pressure, and cholesterol levels.  Your activity level.  Other health conditions  you have, such as heart or kidney disease. How do carbohydrates affect me? Carbohydrates, also called carbs, affect your blood glucose level more than any other type of food. Eating carbs naturally raises the amount of glucose in your blood. Carb counting is a method for keeping track of how many carbs you eat. Counting carbs is important to keep your blood glucose at a healthy level, especially if you use insulin or take certain oral diabetes medicines. It is important to know how many carbs you can safely have in each meal. This is different for every person. Your dietitian can help you calculate how many carbs you should have at each meal and for each snack. Foods that contain carbs include:  Bread, cereal, rice, pasta, and crackers.  Potatoes and corn.  Peas, beans, and lentils.  Milk and yogurt.  Fruit and juice.  Desserts, such as cakes, cookies, ice cream, and candy. How does alcohol affect me? Alcohol can cause a sudden decrease in blood glucose (hypoglycemia), especially if you use insulin or take certain oral diabetes medicines. Hypoglycemia can be a life-threatening condition. Symptoms of hypoglycemia (sleepiness, dizziness, and confusion) are similar to symptoms of having too much alcohol. If your health care provider says that alcohol is safe for you, follow these guidelines:  Limit alcohol intake to no more than 1 drink per day for nonpregnant women and 2 drinks per day for men. One drink equals 12 oz of beer, 5 oz of wine, or 1 oz of hard liquor.    Do not drink on an empty stomach.  Keep yourself hydrated with water, diet soda, or unsweetened iced tea.  Keep in mind that regular soda, juice, and other mixers may contain a lot of sugar and must be counted as carbs. What are tips for following this plan?  Reading food labels  Start by checking the serving size on the "Nutrition Facts" label of packaged foods and drinks. The amount of calories, carbs, fats, and other  nutrients listed on the label is based on one serving of the item. Many items contain more than one serving per package.  Check the total grams (g) of carbs in one serving. You can calculate the number of servings of carbs in one serving by dividing the total carbs by 15. For example, if a food has 30 g of total carbs, it would be equal to 2 servings of carbs.  Check the number of grams (g) of saturated and trans fats in one serving. Choose foods that have low or no amount of these fats.  Check the number of milligrams (mg) of salt (sodium) in one serving. Most people should limit total sodium intake to less than 2,300 mg per day.  Always check the nutrition information of foods labeled as "low-fat" or "nonfat". These foods may be higher in added sugar or refined carbs and should be avoided.  Talk to your dietitian to identify your daily goals for nutrients listed on the label. Shopping  Avoid buying canned, premade, or processed foods. These foods tend to be high in fat, sodium, and added sugar.  Shop around the outside edge of the grocery store. This includes fresh fruits and vegetables, bulk grains, fresh meats, and fresh dairy. Cooking  Use low-heat cooking methods, such as baking, instead of high-heat cooking methods like deep frying.  Cook using healthy oils, such as olive, canola, or sunflower oil.  Avoid cooking with butter, cream, or high-fat meats. Meal planning  Eat meals and snacks regularly, preferably at the same times every day. Avoid going long periods of time without eating.  Eat foods high in fiber, such as fresh fruits, vegetables, beans, and whole grains. Talk to your dietitian about how many servings of carbs you can eat at each meal.  Eat 4-6 ounces (oz) of lean protein each day, such as lean meat, chicken, fish, eggs, or tofu. One oz of lean protein is equal to: ? 1 oz of meat, chicken, or fish. ? 1 egg. ?  cup of tofu.  Eat some foods each day that contain  healthy fats, such as avocado, nuts, seeds, and fish. Lifestyle  Check your blood glucose regularly.  Exercise regularly as told by your health care provider. This may include: ? 150 minutes of moderate-intensity or vigorous-intensity exercise each week. This could be brisk walking, biking, or water aerobics. ? Stretching and doing strength exercises, such as yoga or weightlifting, at least 2 times a week.  Take medicines as told by your health care provider.  Do not use any products that contain nicotine or tobacco, such as cigarettes and e-cigarettes. If you need help quitting, ask your health care provider.  Work with a counselor or diabetes educator to identify strategies to manage stress and any emotional and social challenges. Questions to ask a health care provider  Do I need to meet with a diabetes educator?  Do I need to meet with a dietitian?  What number can I call if I have questions?  When are the best times to   check my blood glucose? Where to find more information:  American Diabetes Association: diabetes.org  Academy of Nutrition and Dietetics: www.eatright.org  National Institute of Diabetes and Digestive and Kidney Diseases (NIH): www.niddk.nih.gov Summary  A healthy meal plan will help you control your blood glucose and maintain a healthy lifestyle.  Working with a diet and nutrition specialist (dietitian) can help you make a meal plan that is best for you.  Keep in mind that carbohydrates (carbs) and alcohol have immediate effects on your blood glucose levels. It is important to count carbs and to use alcohol carefully. This information is not intended to replace advice given to you by your health care provider. Make sure you discuss any questions you have with your health care provider. Document Released: 04/19/2005 Document Revised: 07/05/2017 Document Reviewed: 08/27/2016 Elsevier Patient Education  2020 Elsevier Inc.  

## 2019-04-23 NOTE — Progress Notes (Signed)
Tony Schmitt 48 y.o.   Chief Complaint  Patient presents with  . Acute Renal Failure    hospital follow up    HISTORY OF PRESENT ILLNESS: This is a 48 y.o. male with history of diabetes and hypertension.  Recently discharged from the hospital due to acute kidney injury on top of chronic kidney disease.  Improved with IV hydration.  Here for follow-up today. #1 diabetes: On metformin 1000 mg twice a day and glipizide 5 mg twice a day. 2.  Hypertension: On amlodipine 10 mg daily and lisinopril 20 mg daily. #3 dyslipidemia: On Crestor 20 mg a day. Feels better.  No complaints or medical concerns today. HPI   Prior to Admission medications   Medication Sig Start Date End Date Taking? Authorizing Provider  amLODipine (NORVASC) 10 MG tablet Take 1 tablet (10 mg total) by mouth daily. 04/19/19  Yes Rai, Ripudeep K, MD  lisinopril (ZESTRIL) 20 MG tablet Take 1 tablet (20 mg total) by mouth daily. 04/20/19  Yes Rai, Ripudeep K, MD  metFORMIN (GLUCOPHAGE) 1000 MG tablet Take 1 tablet (1,000 mg total) by mouth 2 (two) times daily with a meal. 08/29/18  Yes Niomie Englert, Ines Bloomer, MD  omeprazole (PRILOSEC) 40 MG capsule Take 1 capsule (40 mg total) by mouth daily. 04/16/19  Yes Horald Pollen, MD  ondansetron (ZOFRAN ODT) 4 MG disintegrating tablet Take 1 tablet (4 mg total) by mouth every 8 (eight) hours as needed for nausea or vomiting. 04/19/19  Yes Rai, Ripudeep K, MD  rosuvastatin (CRESTOR) 20 MG tablet Take 1 tablet (20 mg total) by mouth daily. 04/19/19  Yes Rai, Ripudeep K, MD  glipiZIDE (GLUCOTROL) 5 MG tablet Take 1 tablet (5 mg total) by mouth 2 (two) times daily before a meal. 08/29/18 04/17/19  Horald Pollen, MD    No Known Allergies  Patient Active Problem List   Diagnosis Date Noted  . CKD (chronic kidney disease) stage 3, GFR 30-59 ml/min (HCC) 04/23/2019  . Hyperlipidemia 04/18/2019  . Type 2 diabetes mellitus with hyperglycemia, without long-term current use of  insulin ([place]) 08/29/2018  . Hypertension associated with type 2 diabetes mellitus ([place]) 08/29/2018    Past Medical History:  Diagnosis Date  . Diabetes mellitus without complication ([place])   . Hypertension     History reviewed. No pertinent surgical history.  Social History   Socioeconomic History  . Marital status: Single    Spouse name: Not on file  . Number of children: Not on file  . Years of education: Not on file  . Highest education level: Not on file  Occupational History  . Not on file  Social Needs  . Financial resource strain: Not on file  . Food insecurity    Worry: Not on file    Inability: Not on file  . Transportation needs    Medical: Not on file    Non-medical: Not on file  Tobacco Use  . Smoking status: Former Smoker    Types: Cigarettes, Cigars  . Smokeless tobacco: Never Used  Substance and Sexual Activity  . Alcohol use: Not Currently  . Drug use: Never  . Sexual activity: Not on file  Lifestyle  . Physical activity    Days per week: Not on file    Minutes per session: Not on file  . Stress: Not on file  Relationships  . Social Herbalist on phone: Not on file    Gets together: Not on file  Attends religious service: Not on file    Active member of club or organization: Not on file    Attends meetings of clubs or organizations: Not on file    Relationship status: Not on file  . Intimate partner violence    Fear of current or ex partner: Not on file    Emotionally abused: Not on file    Physically abused: Not on file    Forced sexual activity: Not on file  Other Topics Concern  . Not on file  Social History Narrative  . Not on file    Family History  Problem Relation Age of Onset  . Diabetes Mother   . Diabetes Father   . Hypertension Father   . Cancer Father   . Heart failure Other      Review of Systems  Constitutional: Negative.  Negative for chills and fever.  HENT: Negative.  Negative for congestion and sore  throat.   Respiratory: Negative.  Negative for cough and shortness of breath.   Cardiovascular: Negative.  Negative for chest pain and palpitations.  Gastrointestinal: Negative.  Negative for abdominal pain, blood in stool, diarrhea, nausea and vomiting.  Genitourinary: Negative.   Skin: Negative.  Negative for rash.  Neurological: Negative for dizziness and headaches.  All other systems reviewed and are negative.  Vitals:   04/23/19 1116  BP: (!) 138/55  Pulse: 98  Resp: 16  Temp: 98.7 F (37.1 C)  SpO2: 100%     Physical Exam Vitals signs reviewed.  Constitutional:      Appearance: Normal appearance.  HENT:     Head: Normocephalic.     Mouth/Throat:     Mouth: Mucous membranes are moist.     Pharynx: Oropharynx is clear.  Eyes:     Extraocular Movements: Extraocular movements intact.     Pupils: Pupils are equal, round, and reactive to light.  Neck:     Musculoskeletal: Normal range of motion and neck supple.  Cardiovascular:     Rate and Rhythm: Normal rate and regular rhythm.  Pulmonary:     Effort: Pulmonary effort is normal.     Breath sounds: Normal breath sounds.  Abdominal:     Palpations: Abdomen is soft.     Tenderness: There is no abdominal tenderness.  Musculoskeletal: Normal range of motion.  Skin:    General: Skin is warm and dry.     Capillary Refill: Capillary refill takes less than 2 seconds.  Neurological:     General: No focal deficit present.     Mental Status: He is alert and oriented to person, place, and time.  Psychiatric:        Mood and Affect: Mood normal.        Behavior: Behavior normal.      ASSESSMENT & PLAN: Obryan was seen today for acute renal failure.  Diagnoses and all orders for this visit:  Hypertension associated with type 2 diabetes mellitus ([employer])  Type 2 diabetes mellitus with hyperglycemia, without long-term current use of insulin ([place]) -     Ambulatory referral to Ophthalmology  Essential hypertension  CKD  (chronic kidney disease) stage 3, GFR 30-59 ml/min (HCC) -     Comprehensive metabolic panel  Dyslipidemia associated with type 2 diabetes mellitus ([place])    Patient Instructions       If you have lab work done today you will be contacted with your lab results within the next 2 weeks.  If you have not heard from [place]  then please contact us. The fastest way to get your results is to register for My Chart.   IF you received an x-ray today, you will receive an invoice from [facility]. Please contact [facility] at [phone] with questions or concerns regarding your invoice.   IF you received labwork today, you will receive an invoice from [facility]. Please contact [facility] at [phone] with questions or concerns regarding your invoice.   Our billing staff will not be able to assist you with questions regarding bills from these companies.  You will be contacted with the lab results as soon as they are available. The fastest way to get your results is to activate your My Chart account. Instructions are located on the last page of this paperwork. If you have not heard from [place] regarding the results in 2 weeks, please contact this office.     Diabetes Mellitus and Nutrition, Adult When you have diabetes (diabetes mellitus), it is very important to have healthy eating habits because your blood sugar (glucose) levels are greatly affected by what you eat and drink. Eating healthy foods in the appropriate amounts, at about the same times every day, can help you:  Control your blood glucose.  Lower your risk of heart disease.  Improve your blood pressure.  Reach or maintain a healthy weight. Every person with diabetes is different, and each person has different needs for a meal plan. Your health care provider may recommend that you work with a diet and nutrition specialist (dietitian) to make a meal plan that is best for you. Your meal plan may vary depending on factors  such as:  The calories you need.  The medicines you take.  Your weight.  Your blood glucose, blood pressure, and cholesterol levels.  Your activity level.  Other health conditions you have, such as heart or kidney disease. How do carbohydrates affect me? Carbohydrates, also called carbs, affect your blood glucose level more than any other type of food. Eating carbs naturally raises the amount of glucose in your blood. Carb counting is a method for keeping track of how many carbs you eat. Counting carbs is important to keep your blood glucose at a healthy level, especially if you use insulin or take certain oral diabetes medicines. It is important to know how many carbs you can safely have in each meal. This is different for every person. Your dietitian can help you calculate how many carbs you should have at each meal and for each snack. Foods that contain carbs include:  Bread, cereal, rice, pasta, and crackers.  Potatoes and corn.  Peas, beans, and lentils.  Milk and yogurt.  Fruit and juice.  Desserts, such as cakes, cookies, ice cream, and candy. How does alcohol affect me? Alcohol can cause a sudden decrease in blood glucose (hypoglycemia), especially if you use insulin or take certain oral diabetes medicines. Hypoglycemia can be a life-threatening condition. Symptoms of hypoglycemia (sleepiness, dizziness, and confusion) are similar to symptoms of having too much alcohol. If your health care provider says that alcohol is safe for you, follow these guidelines:  Limit alcohol intake to no more than 1 drink per day for nonpregnant women and 2 drinks per day for men. One drink equals 12 oz of beer, 5 oz of wine, or 1 oz of hard liquor.  Do not drink on an empty stomach.  Keep yourself hydrated with water, diet soda, or unsweetened iced tea.  Keep in mind that regular soda, juice, and other mixers  may contain a lot of sugar and must be counted as carbs. What are tips for  following this plan?  Reading food labels  Start by checking the serving size on the "Nutrition Facts" label of packaged foods and drinks. The amount of calories, carbs, fats, and other nutrients listed on the label is based on one serving of the item. Many items contain more than one serving per package.  Check the total grams (g) of carbs in one serving. You can calculate the number of servings of carbs in one serving by dividing the total carbs by 15. For example, if a food has 30 g of total carbs, it would be equal to 2 servings of carbs.  Check the number of grams (g) of saturated and trans fats in one serving. Choose foods that have low or no amount of these fats.  Check the number of milligrams (mg) of salt (sodium) in one serving. Most people should limit total sodium intake to less than 2,300 mg per day.  Always check the nutrition information of foods labeled as "low-fat" or "nonfat". These foods may be higher in added sugar or refined carbs and should be avoided.  Talk to your dietitian to identify your daily goals for nutrients listed on the label. Shopping  Avoid buying canned, premade, or processed foods. These foods tend to be high in fat, sodium, and added sugar.  Shop around the outside edge of the grocery store. This includes fresh fruits and vegetables, bulk grains, fresh meats, and fresh dairy. Cooking  Use low-heat cooking methods, such as baking, instead of high-heat cooking methods like deep frying.  Cook using healthy oils, such as olive, canola, or sunflower oil.  Avoid cooking with butter, cream, or high-fat meats. Meal planning  Eat meals and snacks regularly, preferably at the same times every day. Avoid going long periods of time without eating.  Eat foods high in fiber, such as fresh fruits, vegetables, beans, and whole grains. Talk to your dietitian about how many servings of carbs you can eat at each meal.  Eat 4-6 ounces (oz) of lean protein each day,  such as lean meat, chicken, fish, eggs, or tofu. One oz of lean protein is equal to: ? 1 oz of meat, chicken, or fish. ? 1 egg. ?  cup of tofu.  Eat some foods each day that contain healthy fats, such as avocado, nuts, seeds, and fish. Lifestyle  Check your blood glucose regularly.  Exercise regularly as told by your health care provider. This may include: ? 150 minutes of moderate-intensity or vigorous-intensity exercise each week. This could be brisk walking, biking, or water aerobics. ? Stretching and doing strength exercises, such as yoga or weightlifting, at least 2 times a week.  Take medicines as told by your health care provider.  Do not use any products that contain nicotine or tobacco, such as cigarettes and e-cigarettes. If you need help quitting, ask your health care provider.  Work with a Social worker or diabetes educator to identify strategies to manage stress and any emotional and social challenges. Questions to ask a health care provider  Do I need to meet with a diabetes educator?  Do I need to meet with a dietitian?  What number can I call if I have questions?  When are the best times to check my blood glucose? Where to find more information:  American Diabetes Association: diabetes.org  Academy of Nutrition and Dietetics: www.eatright.[employer] of Diabetes and Digestive and Kidney  Diseases (NIH): [employer] Summary  A healthy meal plan will help you control your blood glucose and maintain a healthy lifestyle.  Working with a diet and nutrition specialist (dietitian) can help you make a meal plan that is best for you.  Keep in mind that carbohydrates (carbs) and alcohol have immediate effects on your blood glucose levels. It is important to count carbs and to use alcohol carefully. This information is not intended to replace advice given to you by your health care provider. Make sure you discuss any questions you have with your health  care provider. Document Released: 04/19/2005 Document Revised: 07/05/2017 Document Reviewed: 08/27/2016 Elsevier Patient Education  2020 Elsevier Inc.      Agustina Caroli, MD [facility]

## 2019-04-24 LAB — COMPREHENSIVE METABOLIC PANEL
ALT: 38 IU/L (ref 0–44)
AST: 14 IU/L (ref 0–40)
Albumin/Globulin Ratio: 1.4 (ref 1.2–2.2)
Albumin: 3.4 g/dL — ABNORMAL LOW (ref 4.0–5.0)
Alkaline Phosphatase: 106 IU/L (ref 39–117)
BUN/Creatinine Ratio: 10 (ref 9–20)
BUN: 21 mg/dL (ref 6–24)
Bilirubin Total: <0.2 mg/dL (ref 0.0–1.2)
CO2: 18 mmol/L — ABNORMAL LOW (ref 20–29)
Calcium: 10.1 mg/dL (ref 8.7–10.2)
Chloride: 106 mmol/L (ref 96–106)
Creatinine, Ser: 2.13 mg/dL — ABNORMAL HIGH (ref 0.76–1.27)
GFR calc Af Amer: 41 mL/min/{1.73_m2} — ABNORMAL LOW (ref >59)
GFR calc non Af Amer: 35 mL/min/{1.73_m2} — ABNORMAL LOW (ref >59)
Globulin, Total: 2.5 g/dL (ref 1.5–4.5)
Glucose: 133 mg/dL — ABNORMAL HIGH (ref 65–99)
Potassium: 4.5 mmol/L (ref 3.5–5.2)
Sodium: 137 mmol/L (ref 134–144)
Total Protein: 5.9 g/dL — ABNORMAL LOW (ref 6.0–8.5)

## 2019-05-07 LAB — HM DIABETES EYE EXAM

## 2019-05-12 ENCOUNTER — Encounter: Payer: Self-pay | Admitting: *Deleted

## 2019-05-21 ENCOUNTER — Encounter: Payer: Self-pay | Admitting: Emergency Medicine

## 2019-05-21 ENCOUNTER — Ambulatory Visit (INDEPENDENT_AMBULATORY_CARE_PROVIDER_SITE_OTHER): Payer: Self-pay | Admitting: Emergency Medicine

## 2019-05-21 ENCOUNTER — Other Ambulatory Visit: Payer: Self-pay

## 2019-05-21 VITALS — BP 159/84 | HR 96 | Temp 98.6°F | Resp 16 | Ht 64.0 in | Wt 159.8 lb

## 2019-05-21 DIAGNOSIS — I10 Essential (primary) hypertension: Secondary | ICD-10-CM

## 2019-05-21 DIAGNOSIS — E1169 Type 2 diabetes mellitus with other specified complication: Secondary | ICD-10-CM

## 2019-05-21 DIAGNOSIS — E1159 Type 2 diabetes mellitus with other circulatory complications: Secondary | ICD-10-CM

## 2019-05-21 DIAGNOSIS — E785 Hyperlipidemia, unspecified: Secondary | ICD-10-CM

## 2019-05-21 DIAGNOSIS — N1832 Chronic kidney disease, stage 3b: Secondary | ICD-10-CM

## 2019-05-21 NOTE — Assessment & Plan Note (Signed)
Systolic blood pressure still elevated.  Will increase lisinopril to 40 mg daily.

## 2019-05-21 NOTE — Progress Notes (Signed)
Lab Results  Component Value Date   HGBA1C 9.4 (A) 04/16/2019   BP Readings from Last 3 Encounters:  04/23/19 (!) 138/55  04/19/19 (!) 148/78  04/16/19 137/88   Lab Results  Component Value Date   CREATININE 2.13 (H) 04/23/2019   BUN 21 04/23/2019   NA 137 04/23/2019   K 4.5 04/23/2019   CL 106 04/23/2019   CO2 18 (L) 04/23/2019   Lab Results  Component Value Date   CHOL 390 (H) 04/18/2019   HDL 34 (L) 04/18/2019   LDLCALC 293 (H) 04/18/2019   TRIG 313 (H) 04/18/2019   CHOLHDL 11.5 04/18/2019  Discharge Diagnoses:     AKI (acute kidney injury) ([place])  Dehydration  Atypical chest pain  Hyperlipidemia  Type 2 diabetes mellitus with hyperglycemia, without long-term current use of insulin ([place])  Essential hypertension  Hypomagnesemia Tony Schmitt 48 y.o.   Chief Complaint  Patient presents with   Diabetes    follow up one month   Hypertension    HISTORY OF PRESENT ILLNESS: This is a 48 y.o. male with history of diabetes, hypertension, and dyslipidemia, here for follow-up. Energy level has been low.  Otherwise no complaints or medical concerns.  Doing well.   HPI   Prior to Admission medications   Medication Sig Start Date End Date Taking? Authorizing Provider  amLODipine (NORVASC) 10 MG tablet Take 1 tablet (10 mg total) by mouth daily. 04/19/19  Yes Rai, Ripudeep K, MD  lisinopril (ZESTRIL) 20 MG tablet Take 1 tablet (20 mg total) by mouth daily. 04/20/19  Yes Rai, Ripudeep K, MD  metFORMIN (GLUCOPHAGE) 1000 MG tablet Take 1 tablet (1,000 mg total) by mouth 2 (two) times daily with a meal. 08/29/18  Yes Saraiya Kozma, Ines Bloomer, MD  omeprazole (PRILOSEC) 40 MG capsule Take 1 capsule (40 mg total) by mouth daily. 04/16/19  Yes Horald Pollen, MD  ondansetron (ZOFRAN ODT) 4 MG disintegrating tablet Take 1 tablet (4 mg total) by mouth every 8 (eight) hours as needed for nausea or vomiting. 04/19/19  Yes Rai, Ripudeep K, MD  rosuvastatin (CRESTOR) 20 MG  tablet Take 1 tablet (20 mg total) by mouth daily. 04/19/19  Yes Rai, Ripudeep K, MD  glipiZIDE (GLUCOTROL) 5 MG tablet Take 1 tablet (5 mg total) by mouth 2 (two) times daily before a meal. 08/29/18 04/17/19  Horald Pollen, MD    No Known Allergies  Patient Active Problem List   Diagnosis Date Noted   CKD (chronic kidney disease) stage 3, GFR 30-59 ml/min 04/23/2019   Hyperlipidemia 04/18/2019   Dyslipidemia associated with type 2 diabetes mellitus ([place]) 08/29/2018   Hypertension associated with type 2 diabetes mellitus ([place]) 08/29/2018    Past Medical History:  Diagnosis Date   Diabetes mellitus without complication ([place])    Hypertension     No past surgical history on file.  Social History   Socioeconomic History   Marital status: Single    Spouse name: Not on file   Number of children: Not on file   Years of education: Not on file   Highest education level: Not on file  Occupational History   Not on file  Social Needs   Financial resource strain: Not on file   Food insecurity    Worry: Not on file    Inability: Not on file   Transportation needs    Medical: Not on file    Non-medical: Not on file  Tobacco Use   Smoking status: Former  Smoker    Types: Cigarettes, Cigars   Smokeless tobacco: Never Used  Substance and Sexual Activity   Alcohol use: Not Currently   Drug use: Never   Sexual activity: Not on file  Lifestyle   Physical activity    Days per week: Not on file    Minutes per session: Not on file   Stress: Not on file  Relationships   Social connections    Talks on phone: Not on file    Gets together: Not on file    Attends religious service: Not on file    Active member of club or organization: Not on file    Attends meetings of clubs or organizations: Not on file    Relationship status: Not on file   Intimate partner violence    Fear of current or ex partner: Not on file    Emotionally abused: Not on file     Physically abused: Not on file    Forced sexual activity: Not on file  Other Topics Concern   Not on file  Social History Narrative   Not on file    Family History  Problem Relation Age of Onset   Diabetes Mother    Diabetes Father    Hypertension Father    Cancer Father    Heart failure Other      Review of Systems  Constitutional: Negative.  Negative for chills and fever.  HENT: Negative.  Negative for congestion and sore throat.   Eyes: Negative.   Respiratory: Negative.  Negative for cough and shortness of breath.   Cardiovascular: Negative.  Negative for chest pain and palpitations.  Gastrointestinal: Negative.  Negative for abdominal pain, diarrhea, nausea and vomiting.  Genitourinary: Negative.  Negative for dysuria and hematuria.  Musculoskeletal: Negative.  Negative for myalgias.  Skin: Negative for rash.  Neurological: Positive for weakness. Negative for dizziness and headaches.  Endo/Heme/Allergies: Negative.   All other systems reviewed and are negative.   Vitals:   05/21/19 1408  BP: (!) 159/84  Pulse: 96  Resp: 16  Temp: 98.6 F (37 C)  SpO2: 100%    Physical Exam Vitals signs reviewed.  Constitutional:      Appearance: Normal appearance.  HENT:     Head: Normocephalic.     Mouth/Throat:     Mouth: Mucous membranes are moist.     Pharynx: Oropharynx is clear.  Eyes:     Extraocular Movements: Extraocular movements intact.     Conjunctiva/sclera: Conjunctivae normal.     Pupils: Pupils are equal, round, and reactive to light.  Neck:     Musculoskeletal: Normal range of motion and neck supple.  Cardiovascular:     Rate and Rhythm: Normal rate and regular rhythm.     Pulses: Normal pulses.     Heart sounds: Normal heart sounds.  Pulmonary:     Effort: Pulmonary effort is normal.     Breath sounds: Normal breath sounds.  Abdominal:     Palpations: Abdomen is soft.     Tenderness: There is no abdominal tenderness.  Musculoskeletal:  Normal range of motion.  Skin:    General: Skin is warm and dry.     Capillary Refill: Capillary refill takes less than 2 seconds.  Neurological:     General: No focal deficit present.     Mental Status: He is alert and oriented to person, place, and time.  Psychiatric:        Mood and Affect: Mood normal.  ASSESSMENT & PLAN: Kennis was seen today for diabetes and hypertension.  Diagnoses and all orders for this visit:  Hypertension associated with type 2 diabetes mellitus ([place]) -     Comprehensive metabolic panel -     Hemoglobin A1c  Stage 3b chronic kidney disease  Dyslipidemia associated with type 2 diabetes mellitus ([place]) -     Lipid panel    Patient Instructions     Increase lisinopril to 40 mg daily.  So take 20 mg twice a day.  Diabetes Mellitus and Nutrition, Adult When you have diabetes (diabetes mellitus), it is very important to have healthy eating habits because your blood sugar (glucose) levels are greatly affected by what you eat and drink. Eating healthy foods in the appropriate amounts, at about the same times every day, can help you:  Control your blood glucose.  Lower your risk of heart disease.  Improve your blood pressure.  Reach or maintain a healthy weight. Every person with diabetes is different, and each person has different needs for a meal plan. Your health care provider may recommend that you work with a diet and nutrition specialist (dietitian) to make a meal plan that is best for you. Your meal plan may vary depending on factors such as:  The calories you need.  The medicines you take.  Your weight.  Your blood glucose, blood pressure, and cholesterol levels.  Your activity level.  Other health conditions you have, such as heart or kidney disease. How do carbohydrates affect me? Carbohydrates, also called carbs, affect your blood glucose level more than any other type of food. Eating carbs naturally raises the amount of  glucose in your blood. Carb counting is a method for keeping track of how many carbs you eat. Counting carbs is important to keep your blood glucose at a healthy level, especially if you use insulin or take certain oral diabetes medicines. It is important to know how many carbs you can safely have in each meal. This is different for every person. Your dietitian can help you calculate how many carbs you should have at each meal and for each snack. Foods that contain carbs include:  Bread, cereal, rice, pasta, and crackers.  Potatoes and corn.  Peas, beans, and lentils.  Milk and yogurt.  Fruit and juice.  Desserts, such as cakes, cookies, ice cream, and candy. How does alcohol affect me? Alcohol can cause a sudden decrease in blood glucose (hypoglycemia), especially if you use insulin or take certain oral diabetes medicines. Hypoglycemia can be a life-threatening condition. Symptoms of hypoglycemia (sleepiness, dizziness, and confusion) are similar to symptoms of having too much alcohol. If your health care provider says that alcohol is safe for you, follow these guidelines:  Limit alcohol intake to no more than 1 drink per day for nonpregnant women and 2 drinks per day for men. One drink equals 12 oz of beer, 5 oz of wine, or 1 oz of hard liquor.  Do not drink on an empty stomach.  Keep yourself hydrated with water, diet soda, or unsweetened iced tea.  Keep in mind that regular soda, juice, and other mixers may contain a lot of sugar and must be counted as carbs. What are tips for following this plan?  Reading food labels  Start by checking the serving size on the "Nutrition Facts" label of packaged foods and drinks. The amount of calories, carbs, fats, and other nutrients listed on the label is based on one serving of the item. Many  items contain more than one serving per package.  Check the total grams (g) of carbs in one serving. You can calculate the number of servings of carbs  in one serving by dividing the total carbs by 15. For example, if a food has 30 g of total carbs, it would be equal to 2 servings of carbs.  Check the number of grams (g) of saturated and trans fats in one serving. Choose foods that have low or no amount of these fats.  Check the number of milligrams (mg) of salt (sodium) in one serving. Most people should limit total sodium intake to less than 2,300 mg per day.  Always check the nutrition information of foods labeled as "low-fat" or "nonfat". These foods may be higher in added sugar or refined carbs and should be avoided.  Talk to your dietitian to identify your daily goals for nutrients listed on the label. Shopping  Avoid buying canned, premade, or processed foods. These foods tend to be high in fat, sodium, and added sugar.  Shop around the outside edge of the grocery store. This includes fresh fruits and vegetables, bulk grains, fresh meats, and fresh dairy. Cooking  Use low-heat cooking methods, such as baking, instead of high-heat cooking methods like deep frying.  Cook using healthy oils, such as olive, canola, or sunflower oil.  Avoid cooking with butter, cream, or high-fat meats. Meal planning  Eat meals and snacks regularly, preferably at the same times every day. Avoid going long periods of time without eating.  Eat foods high in fiber, such as fresh fruits, vegetables, beans, and whole grains. Talk to your dietitian about how many servings of carbs you can eat at each meal.  Eat 4-6 ounces (oz) of lean protein each day, such as lean meat, chicken, fish, eggs, or tofu. One oz of lean protein is equal to: ? 1 oz of meat, chicken, or fish. ? 1 egg. ?  cup of tofu.  Eat some foods each day that contain healthy fats, such as avocado, nuts, seeds, and fish. Lifestyle  Check your blood glucose regularly.  Exercise regularly as told by your health care provider. This may include: ? 150 minutes of moderate-intensity or  vigorous-intensity exercise each week. This could be brisk walking, biking, or water aerobics. ? Stretching and doing strength exercises, such as yoga or weightlifting, at least 2 times a week.  Take medicines as told by your health care provider.  Do not use any products that contain nicotine or tobacco, such as cigarettes and e-cigarettes. If you need help quitting, ask your health care provider.  Work with a Social worker or diabetes educator to identify strategies to manage stress and any emotional and social challenges. Questions to ask a health care provider  Do I need to meet with a diabetes educator?  Do I need to meet with a dietitian?  What number can I call if I have questions?  When are the best times to check my blood glucose? Where to find more information:  American Diabetes Association: diabetes.org  Academy of Nutrition and Dietetics: www.eatright.[employer] of Diabetes and Digestive and Kidney Diseases (NIH): [employer] Summary  A healthy meal plan will help you control your blood glucose and maintain a healthy lifestyle.  Working with a diet and nutrition specialist (dietitian) can help you make a meal plan that is best for you.  Keep in mind that carbohydrates (carbs) and alcohol have immediate effects on your blood glucose levels. It is important  to count carbs and to use alcohol carefully. This information is not intended to replace advice given to you by your health care provider. Make sure you discuss any questions you have with your health care provider. Document Released: 04/19/2005 Document Revised: 07/05/2017 Document Reviewed: 08/27/2016 Elsevier Patient Education  2020 [employer].  Hypertension, Adult High blood pressure (hypertension) is when the force of blood pumping through the arteries is too strong. The arteries are the blood vessels that carry blood from the heart throughout the body. Hypertension forces the heart to work  harder to pump blood and may cause arteries to become narrow or stiff. Untreated or uncontrolled hypertension can cause a heart attack, heart failure, a stroke, kidney disease, and other problems. A blood pressure reading consists of a higher number over a lower number. Ideally, your blood pressure should be below 120/80. The first ("top") number is called the systolic pressure. It is a measure of the pressure in your arteries as your heart beats. The second ("bottom") number is called the diastolic pressure. It is a measure of the pressure in your arteries as the heart relaxes. What are the causes? The exact cause of this condition is not known. There are some conditions that result in or are related to high blood pressure. What increases the risk? Some risk factors for high blood pressure are under your control. The following factors may make you more likely to develop this condition:  Smoking.  Having type 2 diabetes mellitus, high cholesterol, or both.  Not getting enough exercise or physical activity.  Being overweight.  Having too much fat, sugar, calories, or salt (sodium) in your diet.  Drinking too much alcohol. Some risk factors for high blood pressure may be difficult or impossible to change. Some of these factors include:  Having chronic kidney disease.  Having a family history of high blood pressure.  Age. Risk increases with age.  Race. You may be at higher risk if you are African American.  Gender. Men are at higher risk than women before age 9. After age 62, women are at higher risk than men.  Having obstructive sleep apnea.  Stress. What are the signs or symptoms? High blood pressure may not cause symptoms. Very high blood pressure (hypertensive crisis) may cause:  Headache.  Anxiety.  Shortness of breath.  Nosebleed.  Nausea and vomiting.  Vision changes.  Severe chest pain.  Seizures. How is this diagnosed? This condition is diagnosed by  measuring your blood pressure while you are seated, with your arm resting on a flat surface, your legs uncrossed, and your feet flat on the floor. The cuff of the blood pressure monitor will be placed directly against the skin of your upper arm at the level of your heart. It should be measured at least twice using the same arm. Certain conditions can cause a difference in blood pressure between your right and left arms. Certain factors can cause blood pressure readings to be lower or higher than normal for a short period of time:  When your blood pressure is higher when you are in a health care provider's office than when you are at home, this is called white coat hypertension. Most people with this condition do not need medicines.  When your blood pressure is higher at home than when you are in a health care provider's office, this is called masked hypertension. Most people with this condition may need medicines to control blood pressure. If you have a high blood pressure reading  during one visit or you have normal blood pressure with other risk factors, you may be asked to:  Return on a different day to have your blood pressure checked again.  Monitor your blood pressure at home for 1 week or longer. If you are diagnosed with hypertension, you may have other blood or imaging tests to help your health care provider understand your overall risk for other conditions. How is this treated? This condition is treated by making healthy lifestyle changes, such as eating healthy foods, exercising more, and reducing your alcohol intake. Your health care provider may prescribe medicine if lifestyle changes are not enough to get your blood pressure under control, and if:  Your systolic blood pressure is above 130.  Your diastolic blood pressure is above 80. Your personal target blood pressure may vary depending on your medical conditions, your age, and other factors. Follow these instructions at  home: Eating and drinking   Eat a diet that is high in fiber and potassium, and low in sodium, added sugar, and fat. An example eating plan is called the DASH (Dietary Approaches to Stop Hypertension) diet. To eat this way: ? Eat plenty of fresh fruits and vegetables. Try to fill one half of your plate at each meal with fruits and vegetables. ? Eat whole grains, such as whole-wheat pasta, brown rice, or whole-grain bread. Fill about one fourth of your plate with whole grains. ? Eat or drink low-fat dairy products, such as skim milk or low-fat yogurt. ? Avoid fatty cuts of meat, processed or cured meats, and poultry with skin. Fill about one fourth of your plate with lean proteins, such as fish, chicken without skin, beans, eggs, or tofu. ? Avoid pre-made and processed foods. These tend to be higher in sodium, added sugar, and fat.  Reduce your daily sodium intake. Most people with hypertension should eat less than 1,500 mg of sodium a day.  Do not drink alcohol if: ? Your health care provider tells you not to drink. ? You are pregnant, may be pregnant, or are planning to become pregnant.  If you drink alcohol: ? Limit how much you use to:  0-1 drink a day for women.  0-2 drinks a day for men. ? Be aware of how much alcohol is in your drink. In the [place] one drink equals one 12 oz bottle of beer (355 mL), one 5 oz glass of wine (148 mL), or one 1 oz glass of hard liquor (44 mL). Lifestyle   Work with your health care provider to maintain a healthy body weight or to lose weight. Ask what an ideal weight is for you.  Get at least 30 minutes of exercise most days of the week. Activities may include walking, swimming, or biking.  Include exercise to strengthen your muscles (resistance exercise), such as Pilates or lifting weights, as part of your weekly exercise routine. Try to do these types of exercises for 30 minutes at least 3 days a week.  Do not use any products that contain  nicotine or tobacco, such as cigarettes, e-cigarettes, and chewing tobacco. If you need help quitting, ask your health care provider.  Monitor your blood pressure at home as told by your health care provider.  Keep all follow-up visits as told by your health care provider. This is important. Medicines  Take over-the-counter and prescription medicines only as told by your health care provider. Follow directions carefully. Blood pressure medicines must be taken as prescribed.  Do not skip doses  of blood pressure medicine. Doing this puts you at risk for problems and can make the medicine less effective.  Ask your health care provider about side effects or reactions to medicines that you should watch for. Contact a health care provider if you:  Think you are having a reaction to a medicine you are taking.  Have headaches that keep coming back (recurring).  Feel dizzy.  Have swelling in your ankles.  Have trouble with your vision. Get help right away if you:  Develop a severe headache or confusion.  Have unusual weakness or numbness.  Feel faint.  Have severe pain in your chest or abdomen.  Vomit repeatedly.  Have trouble breathing. Summary  Hypertension is when the force of blood pumping through your arteries is too strong. If this condition is not controlled, it may put you at risk for serious complications.  Your personal target blood pressure may vary depending on your medical conditions, your age, and other factors. For most people, a normal blood pressure is less than 120/80.  Hypertension is treated with lifestyle changes, medicines, or a combination of both. Lifestyle changes include losing weight, eating a healthy, low-sodium diet, exercising more, and limiting alcohol. This information is not intended to replace advice given to you by your health care provider. Make sure you discuss any questions you have with your health care provider. Document Released: 07/23/2005  Document Revised: 04/02/2018 Document Reviewed: 04/02/2018 Elsevier Patient Education  [employer].   If you have lab work done today you will be contacted with your lab results within the next 2 weeks.  If you have not heard from [place] then please contact us. The fastest way to get your results is to register for My Chart.   IF you received an x-ray today, you will receive an invoice from [facility]. Please contact [facility] at [phone] with questions or concerns regarding your invoice.   IF you received labwork today, you will receive an invoice from [facility]. Please contact [facility] at [phone] with questions or concerns regarding your invoice.   Our billing staff will not be able to assist you with questions regarding bills from these companies.  You will be contacted with the lab results as soon as they are available. The fastest way to get your results is to activate your My Chart account. Instructions are located on the last page of this paperwork. If you have not heard from [place] regarding the results in 2 weeks, please contact this office.          Agustina Caroli, MD [facility]

## 2019-05-21 NOTE — Patient Instructions (Addendum)
Increase lisinopril to 40 mg daily.  So take 20 mg twice a day.  Diabetes Mellitus and Nutrition, Adult When you have diabetes (diabetes mellitus), it is very important to have healthy eating habits because your blood sugar (glucose) levels are greatly affected by what you eat and drink. Eating healthy foods in the appropriate amounts, at about the same times every day, can help you:  Control your blood glucose.  Lower your risk of heart disease.  Improve your blood pressure.  Reach or maintain a healthy weight. Every person with diabetes is different, and each person has different needs for a meal plan. Your health care provider may recommend that you work with a diet and nutrition specialist (dietitian) to make a meal plan that is best for you. Your meal plan may vary depending on factors such as:  The calories you need.  The medicines you take.  Your weight.  Your blood glucose, blood pressure, and cholesterol levels.  Your activity level.  Other health conditions you have, such as heart or kidney disease. How do carbohydrates affect me? Carbohydrates, also called carbs, affect your blood glucose level more than any other type of food. Eating carbs naturally raises the amount of glucose in your blood. Carb counting is a method for keeping track of how many carbs you eat. Counting carbs is important to keep your blood glucose at a healthy level, especially if you use insulin or take certain oral diabetes medicines. It is important to know how many carbs you can safely have in each meal. This is different for every person. Your dietitian can help you calculate how many carbs you should have at each meal and for each snack. Foods that contain carbs include:  Bread, cereal, rice, pasta, and crackers.  Potatoes and corn.  Peas, beans, and lentils.  Milk and yogurt.  Fruit and juice.  Desserts, such as cakes, cookies, ice cream, and candy. How does alcohol affect  me? Alcohol can cause a sudden decrease in blood glucose (hypoglycemia), especially if you use insulin or take certain oral diabetes medicines. Hypoglycemia can be a life-threatening condition. Symptoms of hypoglycemia (sleepiness, dizziness, and confusion) are similar to symptoms of having too much alcohol. If your health care provider says that alcohol is safe for you, follow these guidelines:  Limit alcohol intake to no more than 1 drink per day for nonpregnant women and 2 drinks per day for men. One drink equals 12 oz of beer, 5 oz of wine, or 1 oz of hard liquor.  Do not drink on an empty stomach.  Keep yourself hydrated with water, diet soda, or unsweetened iced tea.  Keep in mind that regular soda, juice, and other mixers may contain a lot of sugar and must be counted as carbs. What are tips for following this plan?  Reading food labels  Start by checking the serving size on the "Nutrition Facts" label of packaged foods and drinks. The amount of calories, carbs, fats, and other nutrients listed on the label is based on one serving of the item. Many items contain more than one serving per package.  Check the total grams (g) of carbs in one serving. You can calculate the number of servings of carbs in one serving by dividing the total carbs by 15. For example, if a food has 30 g of total carbs, it would be equal to 2 servings of carbs.  Check the number of grams (g) of saturated and trans fats in one serving.  Choose foods that have low or no amount of these fats.  Check the number of milligrams (mg) of salt (sodium) in one serving. Most people should limit total sodium intake to less than 2,300 mg per day.  Always check the nutrition information of foods labeled as "low-fat" or "nonfat". These foods may be higher in added sugar or refined carbs and should be avoided.  Talk to your dietitian to identify your daily goals for nutrients listed on the label. Shopping  Avoid buying  canned, premade, or processed foods. These foods tend to be high in fat, sodium, and added sugar.  Shop around the outside edge of the grocery store. This includes fresh fruits and vegetables, bulk grains, fresh meats, and fresh dairy. Cooking  Use low-heat cooking methods, such as baking, instead of high-heat cooking methods like deep frying.  Cook using healthy oils, such as olive, canola, or sunflower oil.  Avoid cooking with butter, cream, or high-fat meats. Meal planning  Eat meals and snacks regularly, preferably at the same times every day. Avoid going long periods of time without eating.  Eat foods high in fiber, such as fresh fruits, vegetables, beans, and whole grains. Talk to your dietitian about how many servings of carbs you can eat at each meal.  Eat 4-6 ounces (oz) of lean protein each day, such as lean meat, chicken, fish, eggs, or tofu. One oz of lean protein is equal to: ? 1 oz of meat, chicken, or fish. ? 1 egg. ?  cup of tofu.  Eat some foods each day that contain healthy fats, such as avocado, nuts, seeds, and fish. Lifestyle  Check your blood glucose regularly.  Exercise regularly as told by your health care provider. This may include: ? 150 minutes of moderate-intensity or vigorous-intensity exercise each week. This could be brisk walking, biking, or water aerobics. ? Stretching and doing strength exercises, such as yoga or weightlifting, at least 2 times a week.  Take medicines as told by your health care provider.  Do not use any products that contain nicotine or tobacco, such as cigarettes and e-cigarettes. If you need help quitting, ask your health care provider.  Work with a Social worker or diabetes educator to identify strategies to manage stress and any emotional and social challenges. Questions to ask a health care provider  Do I need to meet with a diabetes educator?  Do I need to meet with a dietitian?  What number can I call if I have  questions?  When are the best times to check my blood glucose? Where to find more information:  American Diabetes Association: diabetes.org  Academy of Nutrition and Dietetics: www.eatright.[employer] of Diabetes and Digestive and Kidney Diseases (NIH): [employer] Summary  A healthy meal plan will help you control your blood glucose and maintain a healthy lifestyle.  Working with a diet and nutrition specialist (dietitian) can help you make a meal plan that is best for you.  Keep in mind that carbohydrates (carbs) and alcohol have immediate effects on your blood glucose levels. It is important to count carbs and to use alcohol carefully. This information is not intended to replace advice given to you by your health care provider. Make sure you discuss any questions you have with your health care provider. Document Released: 04/19/2005 Document Revised: 07/05/2017 Document Reviewed: 08/27/2016 Elsevier Patient Education  2020 [employer].  Hypertension, Adult High blood pressure (hypertension) is when the force of blood pumping through the arteries is too strong.  The arteries are the blood vessels that carry blood from the heart throughout the body. Hypertension forces the heart to work harder to pump blood and may cause arteries to become narrow or stiff. Untreated or uncontrolled hypertension can cause a heart attack, heart failure, a stroke, kidney disease, and other problems. A blood pressure reading consists of a higher number over a lower number. Ideally, your blood pressure should be below 120/80. The first ("top") number is called the systolic pressure. It is a measure of the pressure in your arteries as your heart beats. The second ("bottom") number is called the diastolic pressure. It is a measure of the pressure in your arteries as the heart relaxes. What are the causes? The exact cause of this condition is not known. There are some conditions that result in  or are related to high blood pressure. What increases the risk? Some risk factors for high blood pressure are under your control. The following factors may make you more likely to develop this condition:  Smoking.  Having type 2 diabetes mellitus, high cholesterol, or both.  Not getting enough exercise or physical activity.  Being overweight.  Having too much fat, sugar, calories, or salt (sodium) in your diet.  Drinking too much alcohol. Some risk factors for high blood pressure may be difficult or impossible to change. Some of these factors include:  Having chronic kidney disease.  Having a family history of high blood pressure.  Age. Risk increases with age.  Race. You may be at higher risk if you are African American.  Gender. Men are at higher risk than women before age 50. After age 40, women are at higher risk than men.  Having obstructive sleep apnea.  Stress. What are the signs or symptoms? High blood pressure may not cause symptoms. Very high blood pressure (hypertensive crisis) may cause:  Headache.  Anxiety.  Shortness of breath.  Nosebleed.  Nausea and vomiting.  Vision changes.  Severe chest pain.  Seizures. How is this diagnosed? This condition is diagnosed by measuring your blood pressure while you are seated, with your arm resting on a flat surface, your legs uncrossed, and your feet flat on the floor. The cuff of the blood pressure monitor will be placed directly against the skin of your upper arm at the level of your heart. It should be measured at least twice using the same arm. Certain conditions can cause a difference in blood pressure between your right and left arms. Certain factors can cause blood pressure readings to be lower or higher than normal for a short period of time:  When your blood pressure is higher when you are in a health care provider's office than when you are at home, this is called white coat hypertension. Most people with  this condition do not need medicines.  When your blood pressure is higher at home than when you are in a health care provider's office, this is called masked hypertension. Most people with this condition may need medicines to control blood pressure. If you have a high blood pressure reading during one visit or you have normal blood pressure with other risk factors, you may be asked to:  Return on a different day to have your blood pressure checked again.  Monitor your blood pressure at home for 1 week or longer. If you are diagnosed with hypertension, you may have other blood or imaging tests to help your health care provider understand your overall risk for other conditions. How is this  treated? This condition is treated by making healthy lifestyle changes, such as eating healthy foods, exercising more, and reducing your alcohol intake. Your health care provider may prescribe medicine if lifestyle changes are not enough to get your blood pressure under control, and if:  Your systolic blood pressure is above 130.  Your diastolic blood pressure is above 80. Your personal target blood pressure may vary depending on your medical conditions, your age, and other factors. Follow these instructions at home: Eating and drinking   Eat a diet that is high in fiber and potassium, and low in sodium, added sugar, and fat. An example eating plan is called the DASH (Dietary Approaches to Stop Hypertension) diet. To eat this way: ? Eat plenty of fresh fruits and vegetables. Try to fill one half of your plate at each meal with fruits and vegetables. ? Eat whole grains, such as whole-wheat pasta, brown rice, or whole-grain bread. Fill about one fourth of your plate with whole grains. ? Eat or drink low-fat dairy products, such as skim milk or low-fat yogurt. ? Avoid fatty cuts of meat, processed or cured meats, and poultry with skin. Fill about one fourth of your plate with lean proteins, such as fish, chicken  without skin, beans, eggs, or tofu. ? Avoid pre-made and processed foods. These tend to be higher in sodium, added sugar, and fat.  Reduce your daily sodium intake. Most people with hypertension should eat less than 1,500 mg of sodium a day.  Do not drink alcohol if: ? Your health care provider tells you not to drink. ? You are pregnant, may be pregnant, or are planning to become pregnant.  If you drink alcohol: ? Limit how much you use to:  0-1 drink a day for women.  0-2 drinks a day for men. ? Be aware of how much alcohol is in your drink. In the [place] one drink equals one 12 oz bottle of beer (355 mL), one 5 oz glass of wine (148 mL), or one 1 oz glass of hard liquor (44 mL). Lifestyle   Work with your health care provider to maintain a healthy body weight or to lose weight. Ask what an ideal weight is for you.  Get at least 30 minutes of exercise most days of the week. Activities may include walking, swimming, or biking.  Include exercise to strengthen your muscles (resistance exercise), such as Pilates or lifting weights, as part of your weekly exercise routine. Try to do these types of exercises for 30 minutes at least 3 days a week.  Do not use any products that contain nicotine or tobacco, such as cigarettes, e-cigarettes, and chewing tobacco. If you need help quitting, ask your health care provider.  Monitor your blood pressure at home as told by your health care provider.  Keep all follow-up visits as told by your health care provider. This is important. Medicines  Take over-the-counter and prescription medicines only as told by your health care provider. Follow directions carefully. Blood pressure medicines must be taken as prescribed.  Do not skip doses of blood pressure medicine. Doing this puts you at risk for problems and can make the medicine less effective.  Ask your health care provider about side effects or reactions to medicines that you should watch  for. Contact a health care provider if you:  Think you are having a reaction to a medicine you are taking.  Have headaches that keep coming back (recurring).  Feel dizzy.  Have swelling in your  ankles.  Have trouble with your vision. Get help right away if you:  Develop a severe headache or confusion.  Have unusual weakness or numbness.  Feel faint.  Have severe pain in your chest or abdomen.  Vomit repeatedly.  Have trouble breathing. Summary  Hypertension is when the force of blood pumping through your arteries is too strong. If this condition is not controlled, it may put you at risk for serious complications.  Your personal target blood pressure may vary depending on your medical conditions, your age, and other factors. For most people, a normal blood pressure is less than 120/80.  Hypertension is treated with lifestyle changes, medicines, or a combination of both. Lifestyle changes include losing weight, eating a healthy, low-sodium diet, exercising more, and limiting alcohol. This information is not intended to replace advice given to you by your health care provider. Make sure you discuss any questions you have with your health care provider. Document Released: 07/23/2005 Document Revised: 04/02/2018 Document Reviewed: 04/02/2018 Elsevier Patient Education  [employer].   If you have lab work done today you will be contacted with your lab results within the next 2 weeks.  If you have not heard from [place] then please contact us. The fastest way to get your results is to register for My Chart.   IF you received an x-ray today, you will receive an invoice from [facility]. Please contact [facility] at [phone] with questions or concerns regarding your invoice.   IF you received labwork today, you will receive an invoice from [facility]. Please contact [facility] at [phone] with questions or concerns regarding your invoice.   Our billing  staff will not be able to assist you with questions regarding bills from these companies.  You will be contacted with the lab results as soon as they are available. The fastest way to get your results is to activate your My Chart account. Instructions are located on the last page of this paperwork. If you have not heard from [place] regarding the results in 2 weeks, please contact this office.

## 2019-05-22 LAB — COMPREHENSIVE METABOLIC PANEL
ALT: 48 IU/L — ABNORMAL HIGH (ref 0–44)
AST: 22 IU/L (ref 0–40)
Albumin/Globulin Ratio: 1.4 (ref 1.2–2.2)
Albumin: 3.6 g/dL — ABNORMAL LOW (ref 4.0–5.0)
Alkaline Phosphatase: 133 IU/L — ABNORMAL HIGH (ref 39–117)
BUN/Creatinine Ratio: 16 (ref 9–20)
BUN: 27 mg/dL — ABNORMAL HIGH (ref 6–24)
Bilirubin Total: <0.2 mg/dL (ref 0.0–1.2)
CO2: 21 mmol/L (ref 20–29)
Calcium: 10.4 mg/dL — ABNORMAL HIGH (ref 8.7–10.2)
Chloride: 106 mmol/L (ref 96–106)
Creatinine, Ser: 1.71 mg/dL — ABNORMAL HIGH (ref 0.76–1.27)
GFR calc Af Amer: 53 mL/min/{1.73_m2} — ABNORMAL LOW (ref >59)
GFR calc non Af Amer: 46 mL/min/{1.73_m2} — ABNORMAL LOW (ref >59)
Globulin, Total: 2.6 g/dL (ref 1.5–4.5)
Glucose: 274 mg/dL — ABNORMAL HIGH (ref 65–99)
Potassium: 4.6 mmol/L (ref 3.5–5.2)
Sodium: 139 mmol/L (ref 134–144)
Total Protein: 6.2 g/dL (ref 6.0–8.5)

## 2019-05-22 LAB — LIPID PANEL
Chol/HDL Ratio: 4.7 ratio (ref 0.0–5.0)
Cholesterol, Total: 191 mg/dL (ref 100–199)
HDL: 41 mg/dL (ref >39)
LDL Chol Calc (NIH): 113 mg/dL — ABNORMAL HIGH (ref 0–99)
Triglycerides: 212 mg/dL — ABNORMAL HIGH (ref 0–149)
VLDL Cholesterol Cal: 37 mg/dL (ref 5–40)

## 2019-05-22 LAB — HEMOGLOBIN A1C
Est. average glucose Bld gHb Est-mCnc: 214 mg/dL
Hgb A1c MFr Bld: 9.1 % — ABNORMAL HIGH (ref 4.8–5.6)

## 2019-05-24 ENCOUNTER — Other Ambulatory Visit: Payer: Self-pay | Admitting: Emergency Medicine

## 2019-05-24 ENCOUNTER — Encounter: Payer: Self-pay | Admitting: Emergency Medicine

## 2019-05-24 MED ORDER — TRULICITY 0.75 MG/0.5ML ~~LOC~~ SOAJ: 0.7500 mg | SUBCUTANEOUS | 5 refills | Status: DC

## 2019-07-16 ENCOUNTER — Ambulatory Visit: Payer: Self-pay | Admitting: Emergency Medicine

## 2019-07-23 LAB — HM DIABETES EYE EXAM

## 2019-08-14 ENCOUNTER — Telehealth: Payer: Self-pay

## 2019-08-14 ENCOUNTER — Other Ambulatory Visit: Payer: Self-pay | Admitting: Emergency Medicine

## 2019-08-14 DIAGNOSIS — E1165 Type 2 diabetes mellitus with hyperglycemia: Secondary | ICD-10-CM

## 2019-08-14 NOTE — Telephone Encounter (Signed)
Forwarding medication review request to the clinical pool for review.

## 2019-08-19 ENCOUNTER — Encounter: Payer: Self-pay | Admitting: Emergency Medicine

## 2019-08-19 ENCOUNTER — Ambulatory Visit (INDEPENDENT_AMBULATORY_CARE_PROVIDER_SITE_OTHER): Payer: Self-pay | Admitting: Emergency Medicine

## 2019-08-19 ENCOUNTER — Other Ambulatory Visit: Payer: Self-pay

## 2019-08-19 VITALS — BP 137/85 | HR 101 | Temp 98.7°F | Resp 16 | Ht 64.0 in | Wt 156.0 lb

## 2019-08-19 DIAGNOSIS — N1832 Chronic kidney disease, stage 3b: Secondary | ICD-10-CM

## 2019-08-19 DIAGNOSIS — E1169 Type 2 diabetes mellitus with other specified complication: Secondary | ICD-10-CM

## 2019-08-19 DIAGNOSIS — E1159 Type 2 diabetes mellitus with other circulatory complications: Secondary | ICD-10-CM

## 2019-08-19 DIAGNOSIS — E785 Hyperlipidemia, unspecified: Secondary | ICD-10-CM

## 2019-08-19 DIAGNOSIS — I10 Essential (primary) hypertension: Secondary | ICD-10-CM

## 2019-08-19 LAB — POCT GLYCOSYLATED HEMOGLOBIN (HGB A1C): Hemoglobin A1C: 8.7 % — AB (ref 4.0–5.6)

## 2019-08-19 LAB — GLUCOSE, POCT (MANUAL RESULT ENTRY): POC Glucose: 108 mg/dl — AB (ref 70–99)

## 2019-08-19 MED ORDER — TRULICITY 0.75 MG/0.5ML ~~LOC~~ SOAJ: 0.7500 mg | SUBCUTANEOUS | 5 refills | Status: AC

## 2019-08-19 NOTE — Progress Notes (Signed)
Tony Schmitt 49 y.o.   Chief Complaint  Patient presents with  . Diabetes    3 month follow up  . Hypertension    HISTORY OF PRESENT ILLNESS: This is a 49 y.o. male with history of diabetes and hypertension here for follow-up. Presently on amlodipine and lisinopril for hypertension.  Also taking rosuvastatin 20 mg daily Taking metformin and glipizide for diabetes.  Not taking Trulicity. Lab Results  Component Value Date   HGBA1C 9.1 (H) 05/21/2019   BP Readings from Last 3 Encounters:  08/19/19 137/85  05/21/19 (!) 159/84  04/23/19 (!) 138/55     HPI   Prior to Admission medications   Medication Sig Start Date End Date Taking? Authorizing Provider  amLODipine (NORVASC) 10 MG tablet Take 1 tablet (10 mg total) by mouth daily. 04/19/19  Yes Rai, Ripudeep K, MD  glipiZIDE (GLUCOTROL) 5 MG tablet TAKE 1 TABLET BY MOUTH TWICE DAILY BEFORE A MEAL 08/14/19  Yes Mackenzi Krogh, Ines Bloomer, MD  lisinopril (ZESTRIL) 20 MG tablet Take 1 tablet (20 mg total) by mouth daily. 04/20/19  Yes Rai, Ripudeep K, MD  metFORMIN (GLUCOPHAGE) 1000 MG tablet Take 1 tablet (1,000 mg total) by mouth 2 (two) times daily with a meal. 08/29/18  Yes Lemya Greenwell, Ines Bloomer, MD  omeprazole (PRILOSEC) 40 MG capsule Take 1 capsule (40 mg total) by mouth daily. 04/16/19  Yes Kimbly Eanes, Ines Bloomer, MD  rosuvastatin (CRESTOR) 20 MG tablet Take 1 tablet (20 mg total) by mouth daily. 04/19/19  Yes Rai, Ripudeep K, MD  Dulaglutide (TRULICITY) 3.66 YQ/0.3KV SOPN Inject 0.75 mg into the skin once a week. Patient not taking: Reported on 08/19/2019 05/24/19 08/22/19  Horald Pollen, MD  ondansetron (ZOFRAN ODT) 4 MG disintegrating tablet Take 1 tablet (4 mg total) by mouth every 8 (eight) hours as needed for nausea or vomiting. Patient not taking: Reported on 08/19/2019 04/19/19   Mendel Corning, MD    No Known Allergies  Patient Active Problem List   Diagnosis Date Noted  . CKD (chronic kidney disease) stage 3, GFR 30-59  ml/min 04/23/2019  . Hyperlipidemia 04/18/2019  . Dyslipidemia associated with type 2 diabetes mellitus ([place]) 08/29/2018  . Hypertension associated with type 2 diabetes mellitus ([place]) 08/29/2018    Past Medical History:  Diagnosis Date  . Diabetes mellitus without complication ([place])   . Hypertension     History reviewed. No pertinent surgical history.  Social History   Socioeconomic History  . Marital status: Single    Spouse name: Not on file  . Number of children: Not on file  . Years of education: Not on file  . Highest education level: Not on file  Occupational History  . Not on file  Tobacco Use  . Smoking status: Former Smoker    Types: Cigarettes, Cigars  . Smokeless tobacco: Never Used  Substance and Sexual Activity  . Alcohol use: Not Currently  . Drug use: Never  . Sexual activity: Not on file  Other Topics Concern  . Not on file  Social History Narrative  . Not on file   Social Determinants of Health   Financial Resource Strain:   . Difficulty of Paying Living Expenses: Not on file  Food Insecurity:   . Worried About Charity fundraiser in the Last Year: Not on file  . Ran Out of Food in the Last Year: Not on file  Transportation Needs:   . Lack of Transportation (Medical): Not on file  . Lack  of Transportation (Non-Medical): Not on file  Physical Activity:   . Days of Exercise per Week: Not on file  . Minutes of Exercise per Session: Not on file  Stress:   . Feeling of Stress : Not on file  Social Connections:   . Frequency of Communication with Friends and Family: Not on file  . Frequency of Social Gatherings with Friends and Family: Not on file  . Attends Religious Services: Not on file  . Active Member of Clubs or Organizations: Not on file  . Attends Archivist Meetings: Not on file  . Marital Status: Not on file  Intimate Partner Violence:   . Fear of Current or Ex-Partner: Not on file  . Emotionally Abused: Not on file  .  Physically Abused: Not on file  . Sexually Abused: Not on file    Family History  Problem Relation Age of Onset  . Diabetes Mother   . Diabetes Father   . Hypertension Father   . Cancer Father   . Heart failure Other      Review of Systems  Constitutional: Negative.  Negative for chills and fever.  HENT: Negative.  Negative for congestion and sore throat.   Respiratory: Negative.  Negative for cough and shortness of breath.   Cardiovascular: Negative.  Negative for chest pain.  Gastrointestinal: Negative.  Negative for abdominal pain, diarrhea, nausea and vomiting.  Genitourinary: Negative.  Negative for dysuria and hematuria.  Musculoskeletal: Negative.   Skin: Negative.  Negative for rash.  Neurological: Negative.  Negative for dizziness and headaches.  All other systems reviewed and are negative.  Today's Vitals   08/19/19 1453  BP: 137/85  Pulse: (!) 101  Resp: 16  Temp: 98.7 F (37.1 C)  TempSrc: Temporal  SpO2: 99%  Weight: 156 lb (70.8 kg)  Height: '5\' 4"'  (1.626 m)   Body mass index is 26.78 kg/m.   Physical Exam Vitals reviewed.  Constitutional:      Appearance: Normal appearance.  HENT:     Head: Normocephalic.  Eyes:     Extraocular Movements: Extraocular movements intact.     Conjunctiva/sclera: Conjunctivae normal.     Pupils: Pupils are equal, round, and reactive to light.  Cardiovascular:     Rate and Rhythm: Normal rate and regular rhythm.     Pulses: Normal pulses.     Heart sounds: Normal heart sounds.  Pulmonary:     Effort: Pulmonary effort is normal.     Breath sounds: Normal breath sounds.  Abdominal:     General: There is no distension.     Palpations: Abdomen is soft.     Tenderness: There is no abdominal tenderness.  Musculoskeletal:     Cervical back: Normal range of motion and neck supple.  Skin:    General: Skin is warm and dry.     Capillary Refill: Capillary refill takes less than 2 seconds.  Neurological:     General:  No focal deficit present.     Mental Status: He is alert and oriented to person, place, and time.  Psychiatric:        Mood and Affect: Mood normal.        Behavior: Behavior normal.    Results for orders placed or performed in visit on 08/19/19 (from the past 24 hour(s))  CBC with Differential     Status: Abnormal   Collection Time: 08/19/19  3:26 PM  Result Value Ref Range   WBC 10.0 3.4 - 10.8 x10E3/uL  RBC 4.32 4.14 - 5.80 x10E6/uL   Hemoglobin 11.8 (L) 13.0 - 17.7 g/dL   Hematocrit 36.4 (L) 37.5 - 51.0 %   MCV 84 79 - 97 fL   MCH 27.3 26.6 - 33.0 pg   MCHC 32.4 31.5 - 35.7 g/dL   RDW 13.2 11.6 - 15.4 %   Platelets 429 150 - 450 x10E3/uL   Neutrophils 57 Not Estab. %   Lymphs 32 Not Estab. %   Monocytes 5 Not Estab. %   Eos 5 Not Estab. %   Basos 1 Not Estab. %   Neutrophils Absolute 5.7 1.4 - 7.0 x10E3/uL   Lymphocytes Absolute 3.2 (H) 0.7 - 3.1 x10E3/uL   Monocytes Absolute 0.5 0.1 - 0.9 x10E3/uL   EOS (ABSOLUTE) 0.5 (H) 0.0 - 0.4 x10E3/uL   Basophils Absolute 0.1 0.0 - 0.2 x10E3/uL   Immature Granulocytes 0 Not Estab. %   Immature Grans (Abs) 0.0 0.0 - 0.1 x10E3/uL   Narrative   Performed at:  [address], [place], [place]  [id number] Lab Director: Rush Farmer MD, Phone:  [phone]  Lipid panel     Status: Abnormal   Collection Time: 08/19/19  3:26 PM  Result Value Ref Range   Cholesterol, Total 382 (H) 100 - 199 mg/dL   Triglycerides 292 (H) 0 - 149 mg/dL   HDL 116 >39 mg/dL   VLDL Cholesterol Cal 57 (H) 5 - 40 mg/dL   LDL Chol Calc (NIH) 209 (H) 0 - 99 mg/dL   Chol/HDL Ratio 3.3 0.0 - 5.0 ratio   Narrative   Performed at:  [facility] [address] [place], [place]  [id number] Lab Director: Rush Farmer MD, Phone:  [phone]  CMP14+EGFR     Status: Abnormal   Collection Time: 08/19/19  3:52 PM  Result Value Ref Range   Glucose 123 (H) 65 - 99 mg/dL   BUN 23 6 - 24 mg/dL   Creatinine, Ser 1.84 (H) 0.76 - 1.27  mg/dL   GFR calc non Af Amer 42 (L) >59 mL/min/1.73   GFR calc Af Amer 49 (L) >59 mL/min/1.73   BUN/Creatinine Ratio 13 9 - 20   Sodium 140 134 - 144 mmol/L   Potassium 4.5 3.5 - 5.2 mmol/L   Chloride 104 96 - 106 mmol/L   CO2 24 20 - 29 mmol/L   Calcium 10.6 (H) 8.7 - 10.2 mg/dL   Total Protein 5.9 (L) 6.0 - 8.5 g/dL   Albumin 3.4 (L) 4.0 - 5.0 g/dL   Globulin, Total 2.5 1.5 - 4.5 g/dL   Albumin/Globulin Ratio 1.4 1.2 - 2.2   Bilirubin Total 0.3 0.0 - 1.2 mg/dL   Alkaline Phosphatase 105 39 - 117 IU/L   AST 12 0 - 40 IU/L   ALT 19 0 - 44 IU/L   Narrative   Performed at:  [facility] [address] [facility] [address] [place], [place]  [id number] Lab Director: Rush Farmer MD, Phone:  [phone]  POCT glucose (manual entry)     Status: Abnormal   Collection Time: 08/19/19  4:01 PM  Result Value Ref Range   POC Glucose 108 (A) 70 - 99 mg/dl  POCT glycosylated hemoglobin (Hb A1C)     Status: Abnormal   Collection Time: 08/19/19  4:04 PM  Result Value Ref Range   Hemoglobin A1C 8.7 (A) 4.0 - 5.6 %   HbA1c POC (<> result, manual entry)     HbA1c, POC (prediabetic range)  HbA1c, POC (controlled diabetic range)       ASSESSMENT & PLAN: Hypertension associated with type 2 diabetes mellitus ([place]) Blood pressure well controlled.  Continue present medications.  No changes. Uncontrolled diabetes with hemoglobin A1c of 8.7.  Continue Metformin and glipizide and start Trulicity 8.88 mg weekly.  Good Rx coupon given.  Compliance will be an issue here.  Follow-up in 3 months.  Dyslipidemia associated with type 2 diabetes mellitus ([place]) Not compliant with rosuvastatin.  Expecting abnormal lipid profile.  Encouraged to take medication and cardiovascular risks associated with dyslipidemia discussed with patient.   Dillyn was seen today for diabetes and hypertension.  Diagnoses and all orders for this visit:  Hypertension associated with type 2 diabetes mellitus ([place]) -     POCT  glycosylated hemoglobin (Hb A1C) -     POCT glucose (manual entry) -     Cancel: COMPLETE METABOLIC PANEL WITH GFR -     CMP14+EGFR -     Dulaglutide (TRULICITY) 7.57 VJ/2.8AS SOPN; Inject 0.75 mg into the skin once a week.  Stage 3b chronic kidney disease -     CBC with Differential  Dyslipidemia associated with type 2 diabetes mellitus ([place]) -     Lipid panel    Patient Instructions       If you have lab work done today you will be contacted with your lab results within the next 2 weeks.  If you have not heard from [place] then please contact us. The fastest way to get your results is to register for My Chart.   IF you received an x-ray today, you will receive an invoice from [facility]. Please contact [facility] at [phone] with questions or concerns regarding your invoice.   IF you received labwork today, you will receive an invoice from [facility]. Please contact [facility] at [phone] with questions or concerns regarding your invoice.   Our billing staff will not be able to assist you with questions regarding bills from these companies.  You will be contacted with the lab results as soon as they are available. The fastest way to get your results is to activate your My Chart account. Instructions are located on the last page of this paperwork. If you have not heard from [place] regarding the results in 2 weeks, please contact this office.     Diabetes Mellitus and Nutrition, Adult When you have diabetes (diabetes mellitus), it is very important to have healthy eating habits because your blood sugar (glucose) levels are greatly affected by what you eat and drink. Eating healthy foods in the appropriate amounts, at about the same times every day, can help you:  Control your blood glucose.  Lower your risk of heart disease.  Improve your blood pressure.  Reach or maintain a healthy weight. Every person with diabetes is different, and each person has  different needs for a meal plan. Your health care provider may recommend that you work with a diet and nutrition specialist (dietitian) to make a meal plan that is best for you. Your meal plan may vary depending on factors such as:  The calories you need.  The medicines you take.  Your weight.  Your blood glucose, blood pressure, and cholesterol levels.  Your activity level.  Other health conditions you have, such as heart or kidney disease. How do carbohydrates affect me? Carbohydrates, also called carbs, affect your blood glucose level more than any other type of food. Eating carbs naturally raises the amount of glucose in  your blood. Carb counting is a method for keeping track of how many carbs you eat. Counting carbs is important to keep your blood glucose at a healthy level, especially if you use insulin or take certain oral diabetes medicines. It is important to know how many carbs you can safely have in each meal. This is different for every person. Your dietitian can help you calculate how many carbs you should have at each meal and for each snack. Foods that contain carbs include:  Bread, cereal, rice, pasta, and crackers.  Potatoes and corn.  Peas, beans, and lentils.  Milk and yogurt.  Fruit and juice.  Desserts, such as cakes, cookies, ice cream, and candy. How does alcohol affect me? Alcohol can cause a sudden decrease in blood glucose (hypoglycemia), especially if you use insulin or take certain oral diabetes medicines. Hypoglycemia can be a life-threatening condition. Symptoms of hypoglycemia (sleepiness, dizziness, and confusion) are similar to symptoms of having too much alcohol. If your health care provider says that alcohol is safe for you, follow these guidelines:  Limit alcohol intake to no more than 1 drink per day for nonpregnant women and 2 drinks per day for men. One drink equals 12 oz of beer, 5 oz of wine, or 1 oz of hard liquor.  Do not drink on an  empty stomach.  Keep yourself hydrated with water, diet soda, or unsweetened iced tea.  Keep in mind that regular soda, juice, and other mixers may contain a lot of sugar and must be counted as carbs. What are tips for following this plan?  Reading food labels  Start by checking the serving size on the "Nutrition Facts" label of packaged foods and drinks. The amount of calories, carbs, fats, and other nutrients listed on the label is based on one serving of the item. Many items contain more than one serving per package.  Check the total grams (g) of carbs in one serving. You can calculate the number of servings of carbs in one serving by dividing the total carbs by 15. For example, if a food has 30 g of total carbs, it would be equal to 2 servings of carbs.  Check the number of grams (g) of saturated and trans fats in one serving. Choose foods that have low or no amount of these fats.  Check the number of milligrams (mg) of salt (sodium) in one serving. Most people should limit total sodium intake to less than 2,300 mg per day.  Always check the nutrition information of foods labeled as "low-fat" or "nonfat". These foods may be higher in added sugar or refined carbs and should be avoided.  Talk to your dietitian to identify your daily goals for nutrients listed on the label. Shopping  Avoid buying canned, premade, or processed foods. These foods tend to be high in fat, sodium, and added sugar.  Shop around the outside edge of the grocery store. This includes fresh fruits and vegetables, bulk grains, fresh meats, and fresh dairy. Cooking  Use low-heat cooking methods, such as baking, instead of high-heat cooking methods like deep frying.  Cook using healthy oils, such as olive, canola, or sunflower oil.  Avoid cooking with butter, cream, or high-fat meats. Meal planning  Eat meals and snacks regularly, preferably at the same times every day. Avoid going long periods of time without  eating.  Eat foods high in fiber, such as fresh fruits, vegetables, beans, and whole grains. Talk to your dietitian about how many servings of  carbs you can eat at each meal.  Eat 4-6 ounces (oz) of lean protein each day, such as lean meat, chicken, fish, eggs, or tofu. One oz of lean protein is equal to: ? 1 oz of meat, chicken, or fish. ? 1 egg. ?  cup of tofu.  Eat some foods each day that contain healthy fats, such as avocado, nuts, seeds, and fish. Lifestyle  Check your blood glucose regularly.  Exercise regularly as told by your health care provider. This may include: ? 150 minutes of moderate-intensity or vigorous-intensity exercise each week. This could be brisk walking, biking, or water aerobics. ? Stretching and doing strength exercises, such as yoga or weightlifting, at least 2 times a week.  Take medicines as told by your health care provider.  Do not use any products that contain nicotine or tobacco, such as cigarettes and e-cigarettes. If you need help quitting, ask your health care provider.  Work with a Social worker or diabetes educator to identify strategies to manage stress and any emotional and social challenges. Questions to ask a health care provider  Do I need to meet with a diabetes educator?  Do I need to meet with a dietitian?  What number can I call if I have questions?  When are the best times to check my blood glucose? Where to find more information:  American Diabetes Association: diabetes.org  Academy of Nutrition and Dietetics: www.eatright.[employer] of Diabetes and Digestive and Kidney Diseases (NIH): [employer] Summary  A healthy meal plan will help you control your blood glucose and maintain a healthy lifestyle.  Working with a diet and nutrition specialist (dietitian) can help you make a meal plan that is best for you.  Keep in mind that carbohydrates (carbs) and alcohol have immediate effects on your blood glucose  levels. It is important to count carbs and to use alcohol carefully. This information is not intended to replace advice given to you by your health care provider. Make sure you discuss any questions you have with your health care provider. Document Revised: 07/05/2017 Document Reviewed: 08/27/2016 Elsevier Patient Education  2020 Elsevier Inc.      Agustina Caroli, MD [facility]

## 2019-08-19 NOTE — Patient Instructions (Addendum)
   If you have lab work done today you will be contacted with your lab results within the next 2 weeks.  If you have not heard from us then please contact us. The fastest way to get your results is to register for My Chart.   IF you received an x-ray today, you will receive an invoice from [facility]. Please contact  Radiology at [phone] with questions or concerns regarding your invoice.   IF you received labwork today, you will receive an invoice from [facility]. Please contact [facility] at [phone] with questions or concerns regarding your invoice.   Our billing staff will not be able to assist you with questions regarding bills from these companies.  You will be contacted with the lab results as soon as they are available. The fastest way to get your results is to activate your My Chart account. Instructions are located on the last page of this paperwork. If you have not heard from us regarding the results in 2 weeks, please contact this office.     Diabetes Mellitus and Nutrition, Adult When you have diabetes (diabetes mellitus), it is very important to have healthy eating habits because your blood sugar (glucose) levels are greatly affected by what you eat and drink. Eating healthy foods in the appropriate amounts, at about the same times every day, can help you:  Control your blood glucose.  Lower your risk of heart disease.  Improve your blood pressure.  Reach or maintain a healthy weight. Every person with diabetes is different, and each person has different needs for a meal plan. Your health care provider may recommend that you work with a diet and nutrition specialist (dietitian) to make a meal plan that is best for you. Your meal plan may vary depending on factors such as:  The calories you need.  The medicines you take.  Your weight.  Your blood glucose, blood pressure, and cholesterol levels.  Your activity level.  Other health conditions  you have, such as heart or kidney disease. How do carbohydrates affect me? Carbohydrates, also called carbs, affect your blood glucose level more than any other type of food. Eating carbs naturally raises the amount of glucose in your blood. Carb counting is a method for keeping track of how many carbs you eat. Counting carbs is important to keep your blood glucose at a healthy level, especially if you use insulin or take certain oral diabetes medicines. It is important to know how many carbs you can safely have in each meal. This is different for every person. Your dietitian can help you calculate how many carbs you should have at each meal and for each snack. Foods that contain carbs include:  Bread, cereal, rice, pasta, and crackers.  Potatoes and corn.  Peas, beans, and lentils.  Milk and yogurt.  Fruit and juice.  Desserts, such as cakes, cookies, ice cream, and candy. How does alcohol affect me? Alcohol can cause a sudden decrease in blood glucose (hypoglycemia), especially if you use insulin or take certain oral diabetes medicines. Hypoglycemia can be a life-threatening condition. Symptoms of hypoglycemia (sleepiness, dizziness, and confusion) are similar to symptoms of having too much alcohol. If your health care provider says that alcohol is safe for you, follow these guidelines:  Limit alcohol intake to no more than 1 drink per day for nonpregnant women and 2 drinks per day for men. One drink equals 12 oz of beer, 5 oz of wine, or 1 oz of hard liquor.    Do not drink on an empty stomach.  Keep yourself hydrated with water, diet soda, or unsweetened iced tea.  Keep in mind that regular soda, juice, and other mixers may contain a lot of sugar and must be counted as carbs. What are tips for following this plan?  Reading food labels  Start by checking the serving size on the "Nutrition Facts" label of packaged foods and drinks. The amount of calories, carbs, fats, and other  nutrients listed on the label is based on one serving of the item. Many items contain more than one serving per package.  Check the total grams (g) of carbs in one serving. You can calculate the number of servings of carbs in one serving by dividing the total carbs by 15. For example, if a food has 30 g of total carbs, it would be equal to 2 servings of carbs.  Check the number of grams (g) of saturated and trans fats in one serving. Choose foods that have low or no amount of these fats.  Check the number of milligrams (mg) of salt (sodium) in one serving. Most people should limit total sodium intake to less than 2,300 mg per day.  Always check the nutrition information of foods labeled as "low-fat" or "nonfat". These foods may be higher in added sugar or refined carbs and should be avoided.  Talk to your dietitian to identify your daily goals for nutrients listed on the label. Shopping  Avoid buying canned, premade, or processed foods. These foods tend to be high in fat, sodium, and added sugar.  Shop around the outside edge of the grocery store. This includes fresh fruits and vegetables, bulk grains, fresh meats, and fresh dairy. Cooking  Use low-heat cooking methods, such as baking, instead of high-heat cooking methods like deep frying.  Cook using healthy oils, such as olive, canola, or sunflower oil.  Avoid cooking with butter, cream, or high-fat meats. Meal planning  Eat meals and snacks regularly, preferably at the same times every day. Avoid going long periods of time without eating.  Eat foods high in fiber, such as fresh fruits, vegetables, beans, and whole grains. Talk to your dietitian about how many servings of carbs you can eat at each meal.  Eat 4-6 ounces (oz) of lean protein each day, such as lean meat, chicken, fish, eggs, or tofu. One oz of lean protein is equal to: ? 1 oz of meat, chicken, or fish. ? 1 egg. ?  cup of tofu.  Eat some foods each day that contain  healthy fats, such as avocado, nuts, seeds, and fish. Lifestyle  Check your blood glucose regularly.  Exercise regularly as told by your health care provider. This may include: ? 150 minutes of moderate-intensity or vigorous-intensity exercise each week. This could be brisk walking, biking, or water aerobics. ? Stretching and doing strength exercises, such as yoga or weightlifting, at least 2 times a week.  Take medicines as told by your health care provider.  Do not use any products that contain nicotine or tobacco, such as cigarettes and e-cigarettes. If you need help quitting, ask your health care provider.  Work with a counselor or diabetes educator to identify strategies to manage stress and any emotional and social challenges. Questions to ask a health care provider  Do I need to meet with a diabetes educator?  Do I need to meet with a dietitian?  What number can I call if I have questions?  When are the best times to   times to check my blood glucose? Where to find more information:  American Diabetes Association: diabetes.org  Academy of Nutrition and Dietetics: www.eatright.org  National Institute of Diabetes and Digestive and Kidney Diseases (NIH): www.niddk.nih.gov Summary  A healthy meal plan will help you control your blood glucose and maintain a healthy lifestyle.  Working with a diet and nutrition specialist (dietitian) can help you make a meal plan that is best for you.  Keep in mind that carbohydrates (carbs) and alcohol have immediate effects on your blood glucose levels. It is important to count carbs and to use alcohol carefully. This information is not intended to replace advice given to you by your health care provider. Make sure you discuss any questions you have with your health care provider. Document Revised: 07/05/2017 Document Reviewed: 08/27/2016 Elsevier Patient Education  2020 Elsevier Inc.  

## 2019-08-20 ENCOUNTER — Encounter: Payer: Self-pay | Admitting: Emergency Medicine

## 2019-08-20 LAB — CBC WITH DIFFERENTIAL/PLATELET
Basophils Absolute: 0.1 10*3/uL (ref 0.0–0.2)
Basos: 1 %
EOS (ABSOLUTE): 0.5 10*3/uL — ABNORMAL HIGH (ref 0.0–0.4)
Eos: 5 %
Hematocrit: 36.4 % — ABNORMAL LOW (ref 37.5–51.0)
Hemoglobin: 11.8 g/dL — ABNORMAL LOW (ref 13.0–17.7)
Immature Grans (Abs): 0 10*3/uL (ref 0.0–0.1)
Immature Granulocytes: 0 %
Lymphocytes Absolute: 3.2 10*3/uL — ABNORMAL HIGH (ref 0.7–3.1)
Lymphs: 32 %
MCH: 27.3 pg (ref 26.6–33.0)
MCHC: 32.4 g/dL (ref 31.5–35.7)
MCV: 84 fL (ref 79–97)
Monocytes Absolute: 0.5 10*3/uL (ref 0.1–0.9)
Monocytes: 5 %
Neutrophils Absolute: 5.7 10*3/uL (ref 1.4–7.0)
Neutrophils: 57 %
Platelets: 429 10*3/uL (ref 150–450)
RBC: 4.32 x10E6/uL (ref 4.14–5.80)
RDW: 13.2 % (ref 11.6–15.4)
WBC: 10 10*3/uL (ref 3.4–10.8)

## 2019-08-20 LAB — LIPID PANEL
Chol/HDL Ratio: 3.3 ratio (ref 0.0–5.0)
Cholesterol, Total: 382 mg/dL — ABNORMAL HIGH (ref 100–199)
HDL: 116 mg/dL (ref >39)
LDL Chol Calc (NIH): 209 mg/dL — ABNORMAL HIGH (ref 0–99)
Triglycerides: 292 mg/dL — ABNORMAL HIGH (ref 0–149)
VLDL Cholesterol Cal: 57 mg/dL — ABNORMAL HIGH (ref 5–40)

## 2019-08-20 LAB — CMP14+EGFR
ALT: 19 IU/L (ref 0–44)
AST: 12 IU/L (ref 0–40)
Albumin/Globulin Ratio: 1.4 (ref 1.2–2.2)
Albumin: 3.4 g/dL — ABNORMAL LOW (ref 4.0–5.0)
Alkaline Phosphatase: 105 IU/L (ref 39–117)
BUN/Creatinine Ratio: 13 (ref 9–20)
BUN: 23 mg/dL (ref 6–24)
Bilirubin Total: 0.3 mg/dL (ref 0.0–1.2)
CO2: 24 mmol/L (ref 20–29)
Calcium: 10.6 mg/dL — ABNORMAL HIGH (ref 8.7–10.2)
Chloride: 104 mmol/L (ref 96–106)
Creatinine, Ser: 1.84 mg/dL — ABNORMAL HIGH (ref 0.76–1.27)
GFR calc Af Amer: 49 mL/min/{1.73_m2} — ABNORMAL LOW (ref >59)
GFR calc non Af Amer: 42 mL/min/{1.73_m2} — ABNORMAL LOW (ref >59)
Globulin, Total: 2.5 g/dL (ref 1.5–4.5)
Glucose: 123 mg/dL — ABNORMAL HIGH (ref 65–99)
Potassium: 4.5 mmol/L (ref 3.5–5.2)
Sodium: 140 mmol/L (ref 134–144)
Total Protein: 5.9 g/dL — ABNORMAL LOW (ref 6.0–8.5)

## 2019-08-20 NOTE — Assessment & Plan Note (Signed)
Blood pressure well controlled.  Continue present medications.  No changes. Uncontrolled diabetes with hemoglobin A1c of 8.7.  Continue Metformin and glipizide and start Trulicity [id number] mg weekly.  Good Rx coupon given.  Compliance will be an issue here.  Follow-up in 3 months.

## 2019-08-20 NOTE — Assessment & Plan Note (Signed)
Not compliant with rosuvastatin.  Expecting abnormal lipid profile.  Encouraged to take medication and cardiovascular risks associated with dyslipidemia discussed with patient.

## 2019-09-01 ENCOUNTER — Other Ambulatory Visit: Payer: Self-pay | Admitting: Emergency Medicine

## 2019-09-01 DIAGNOSIS — I10 Essential (primary) hypertension: Secondary | ICD-10-CM

## 2019-09-23 ENCOUNTER — Other Ambulatory Visit: Payer: Self-pay | Admitting: Emergency Medicine

## 2019-09-23 DIAGNOSIS — E1165 Type 2 diabetes mellitus with hyperglycemia: Secondary | ICD-10-CM

## 2019-09-23 DIAGNOSIS — I10 Essential (primary) hypertension: Secondary | ICD-10-CM

## 2019-09-24 MED ORDER — GLIPIZIDE 5 MG PO TABS: 5.0000 mg | ORAL_TABLET | Freq: Two times a day (BID) | ORAL | 0 refills | Status: DC

## 2019-10-22 ENCOUNTER — Telehealth: Payer: Self-pay | Admitting: Emergency Medicine

## 2019-10-22 NOTE — Telephone Encounter (Signed)
Called patient to reschedule due to provider not being in the office for the day of the appointment

## 2019-11-19 ENCOUNTER — Ambulatory Visit: Payer: Self-pay | Admitting: Emergency Medicine

## 2019-12-08 NOTE — Telephone Encounter (Signed)
No action required.

## 2020-02-05 ENCOUNTER — Other Ambulatory Visit: Payer: Self-pay | Admitting: Emergency Medicine

## 2020-02-05 DIAGNOSIS — I10 Essential (primary) hypertension: Secondary | ICD-10-CM

## 2020-02-05 MED ORDER — LISINOPRIL 20 MG PO TABS: 20.0000 mg | ORAL_TABLET | Freq: Every day | ORAL | 0 refills | Status: DC

## 2020-02-23 ENCOUNTER — Other Ambulatory Visit: Payer: Self-pay | Admitting: Emergency Medicine

## 2020-02-23 DIAGNOSIS — E1165 Type 2 diabetes mellitus with hyperglycemia: Secondary | ICD-10-CM

## 2020-02-23 NOTE — Telephone Encounter (Signed)
Requested Prescriptions  Pending Prescriptions Disp Refills   glipiZIDE (GLUCOTROL) 5 MG tablet [Pharmacy Med Name: glipiZIDE 5 MG Oral Tablet] 60 tablet 0    Sig: TAKE 1 TABLET BY MOUTH TWICE DAILY BEFORE A MEAL     Endocrinology:  Diabetes - Sulfonylureas Failed - 02/23/2020  7:57 PM      Failed - HBA1C is between 0 and 7.9 and within 180 days    Hemoglobin A1C  Date Value Ref Range Status  08/19/2019 8.7 (A) 4.0 - 5.6 % Final   Hgb A1c MFr Bld  Date Value Ref Range Status  05/21/2019 9.1 (H) 4.8 - 5.6 % Final    Comment:             Prediabetes: 5.7 - 6.4          Diabetes: >6.4          Glycemic control for adults with diabetes: <7.0          Failed - Valid encounter within last 6 months    Recent Outpatient Visits          6 months ago Hypertension associated with type 2 diabetes mellitus [facility])   [facility] at [place], [place], MD   9 months ago Hypertension associated with type 2 diabetes mellitus [facility])   Primary Care at [place], [place], MD   10 months ago Hypertension associated with type 2 diabetes mellitus [facility])   Primary Care at [place], [place], MD   10 months ago Type 2 diabetes mellitus with hyperglycemia, without long-term current use of insulin [facility])   Primary Care at [facility], Ines Bloomer, MD   1 year ago Hyperglycemia   Primary Care at [place], Ines Bloomer, MD             One month courtesy refill provided with reminder for patient to schedule appointment for office visit and labs.

## 2020-04-15 ENCOUNTER — Inpatient Hospital Stay (HOSPITAL_COMMUNITY)
Admission: EM | Admit: 2020-04-15 | Discharge: 2020-04-22 | DRG: 065 | Disposition: A | Discharge status: Rehabilitation Facility | Payer: Self-pay | Attending: Internal Medicine | Admitting: Internal Medicine

## 2020-04-15 ENCOUNTER — Other Ambulatory Visit: Payer: Self-pay

## 2020-04-15 ENCOUNTER — Encounter (HOSPITAL_COMMUNITY): Payer: Self-pay

## 2020-04-15 DIAGNOSIS — Z720 Tobacco use: Secondary | ICD-10-CM

## 2020-04-15 DIAGNOSIS — N1831 Chronic kidney disease, stage 3a: Secondary | ICD-10-CM | POA: Diagnosis present

## 2020-04-15 DIAGNOSIS — G8194 Hemiplegia, unspecified affecting left nondominant side: Secondary | ICD-10-CM | POA: Diagnosis present

## 2020-04-15 DIAGNOSIS — E1122 Type 2 diabetes mellitus with diabetic chronic kidney disease: Secondary | ICD-10-CM | POA: Diagnosis present

## 2020-04-15 DIAGNOSIS — I160 Hypertensive urgency: Secondary | ICD-10-CM | POA: Diagnosis present

## 2020-04-15 DIAGNOSIS — Z9114 Patient's other noncompliance with medication regimen: Secondary | ICD-10-CM

## 2020-04-15 DIAGNOSIS — I129 Hypertensive chronic kidney disease with stage 1 through stage 4 chronic kidney disease, or unspecified chronic kidney disease: Secondary | ICD-10-CM | POA: Diagnosis present

## 2020-04-15 DIAGNOSIS — D631 Anemia in chronic kidney disease: Secondary | ICD-10-CM | POA: Diagnosis present

## 2020-04-15 DIAGNOSIS — Z7984 Long term (current) use of oral hypoglycemic drugs: Secondary | ICD-10-CM

## 2020-04-15 DIAGNOSIS — Z20822 Contact with and (suspected) exposure to covid-19: Secondary | ICD-10-CM | POA: Diagnosis present

## 2020-04-15 DIAGNOSIS — E876 Hypokalemia: Secondary | ICD-10-CM | POA: Diagnosis not present

## 2020-04-15 DIAGNOSIS — Z809 Family history of malignant neoplasm, unspecified: Secondary | ICD-10-CM

## 2020-04-15 DIAGNOSIS — I10 Essential (primary) hypertension: Secondary | ICD-10-CM

## 2020-04-15 DIAGNOSIS — Z8249 Family history of ischemic heart disease and other diseases of the circulatory system: Secondary | ICD-10-CM

## 2020-04-15 DIAGNOSIS — Z833 Family history of diabetes mellitus: Secondary | ICD-10-CM

## 2020-04-15 DIAGNOSIS — E1159 Type 2 diabetes mellitus with other circulatory complications: Secondary | ICD-10-CM | POA: Diagnosis present

## 2020-04-15 DIAGNOSIS — R531 Weakness: Secondary | ICD-10-CM

## 2020-04-15 DIAGNOSIS — E1165 Type 2 diabetes mellitus with hyperglycemia: Secondary | ICD-10-CM | POA: Diagnosis present

## 2020-04-15 DIAGNOSIS — R29705 NIHSS score 5: Secondary | ICD-10-CM | POA: Diagnosis present

## 2020-04-15 DIAGNOSIS — E875 Hyperkalemia: Secondary | ICD-10-CM | POA: Diagnosis not present

## 2020-04-15 DIAGNOSIS — E785 Hyperlipidemia, unspecified: Secondary | ICD-10-CM | POA: Diagnosis present

## 2020-04-15 DIAGNOSIS — Z87891 Personal history of nicotine dependence: Secondary | ICD-10-CM

## 2020-04-15 DIAGNOSIS — E872 Acidosis: Secondary | ICD-10-CM | POA: Diagnosis not present

## 2020-04-15 DIAGNOSIS — I639 Cerebral infarction, unspecified: Principal | ICD-10-CM | POA: Diagnosis present

## 2020-04-15 DIAGNOSIS — R471 Dysarthria and anarthria: Secondary | ICD-10-CM | POA: Diagnosis present

## 2020-04-15 DIAGNOSIS — E1169 Type 2 diabetes mellitus with other specified complication: Secondary | ICD-10-CM | POA: Diagnosis present

## 2020-04-15 DIAGNOSIS — Z79899 Other long term (current) drug therapy: Secondary | ICD-10-CM

## 2020-04-15 DIAGNOSIS — N183 Chronic kidney disease, stage 3 unspecified: Secondary | ICD-10-CM | POA: Diagnosis present

## 2020-04-15 NOTE — ED Triage Notes (Addendum)
Pt reports numbness and weakness in his L side since Thursday morning. Pt also urinated on himself and states that he cant control it. No slurred speech or facial drooping. A&Ox4.   Maylon Cos RN

## 2020-04-16 ENCOUNTER — Inpatient Hospital Stay (HOSPITAL_COMMUNITY): Payer: Self-pay

## 2020-04-16 ENCOUNTER — Emergency Department (HOSPITAL_COMMUNITY): Payer: Self-pay

## 2020-04-16 ENCOUNTER — Encounter (HOSPITAL_COMMUNITY): Payer: Self-pay | Admitting: Internal Medicine

## 2020-04-16 DIAGNOSIS — I160 Hypertensive urgency: Secondary | ICD-10-CM

## 2020-04-16 DIAGNOSIS — I6389 Other cerebral infarction: Secondary | ICD-10-CM

## 2020-04-16 DIAGNOSIS — E1159 Type 2 diabetes mellitus with other circulatory complications: Secondary | ICD-10-CM | POA: Diagnosis present

## 2020-04-16 DIAGNOSIS — I639 Cerebral infarction, unspecified: Secondary | ICD-10-CM | POA: Diagnosis present

## 2020-04-16 LAB — GLUCOSE, CAPILLARY: Glucose-Capillary: 179 mg/dL — ABNORMAL HIGH (ref 70–99)

## 2020-04-16 LAB — CBC WITH DIFFERENTIAL/PLATELET
Abs Immature Granulocytes: 0.14 10*3/uL — ABNORMAL HIGH (ref 0.00–0.07)
Basophils Absolute: 0.1 10*3/uL (ref 0.0–0.1)
Basophils Relative: 1 %
Eosinophils Absolute: 0.1 10*3/uL (ref 0.0–0.5)
Eosinophils Relative: 0 %
HCT: 33.3 % — ABNORMAL LOW (ref 39.0–52.0)
Hemoglobin: 10.7 g/dL — ABNORMAL LOW (ref 13.0–17.0)
Immature Granulocytes: 1 %
Lymphocytes Relative: 15 %
Lymphs Abs: 1.8 10*3/uL (ref 0.7–4.0)
MCH: 27.4 pg (ref 26.0–34.0)
MCHC: 32.1 g/dL (ref 30.0–36.0)
MCV: 85.4 fL (ref 80.0–100.0)
Monocytes Absolute: 0.4 10*3/uL (ref 0.1–1.0)
Monocytes Relative: 4 %
Neutro Abs: 9.5 10*3/uL — ABNORMAL HIGH (ref 1.7–7.7)
Neutrophils Relative %: 79 %
Platelets: 455 10*3/uL — ABNORMAL HIGH (ref 150–400)
RBC: 3.9 MIL/uL — ABNORMAL LOW (ref 4.22–5.81)
RDW: 14.2 % (ref 11.5–15.5)
WBC: 12 10*3/uL — ABNORMAL HIGH (ref 4.0–10.5)
nRBC: 0 % (ref 0.0–0.2)

## 2020-04-16 LAB — RAPID URINE DRUG SCREEN, HOSP PERFORMED
Amphetamines: NOT DETECTED
Barbiturates: NOT DETECTED
Benzodiazepines: NOT DETECTED
Cocaine: NOT DETECTED
Opiates: NOT DETECTED
Tetrahydrocannabinol: NOT DETECTED

## 2020-04-16 LAB — PROTIME-INR
INR: 1 (ref 0.8–1.2)
Prothrombin Time: 12.7 seconds (ref 11.4–15.2)

## 2020-04-16 LAB — I-STAT CHEM 8, ED
BUN: 31 mg/dL — ABNORMAL HIGH (ref 6–20)
Calcium, Ion: 1.3 mmol/L (ref 1.15–1.40)
Chloride: 110 mmol/L (ref 98–111)
Creatinine, Ser: 3.5 mg/dL — ABNORMAL HIGH (ref 0.61–1.24)
Glucose, Bld: 218 mg/dL — ABNORMAL HIGH (ref 70–99)
HCT: 31 % — ABNORMAL LOW (ref 39.0–52.0)
Hemoglobin: 10.5 g/dL — ABNORMAL LOW (ref 13.0–17.0)
Potassium: 4 mmol/L (ref 3.5–5.1)
Sodium: 141 mmol/L (ref 135–145)
TCO2: 23 mmol/L (ref 22–32)

## 2020-04-16 LAB — LIPID PANEL
Cholesterol: 683 mg/dL — ABNORMAL HIGH (ref 0–200)
Cholesterol: 699 mg/dL — ABNORMAL HIGH (ref 0–200)
HDL: 41 mg/dL (ref >40)
HDL: 40 mg/dL — ABNORMAL LOW (ref >40)
LDL Cholesterol: 584 mg/dL — ABNORMAL HIGH (ref 0–99)
LDL Cholesterol: 599 mg/dL — ABNORMAL HIGH (ref 0–99)
Total CHOL/HDL Ratio: 16.7 RATIO
Total CHOL/HDL Ratio: 17.5 RATIO
Triglycerides: 291 mg/dL — ABNORMAL HIGH (ref <150)
Triglycerides: 298 mg/dL — ABNORMAL HIGH (ref <150)
VLDL: 58 mg/dL — ABNORMAL HIGH (ref 0–40)
VLDL: 60 mg/dL — ABNORMAL HIGH (ref 0–40)

## 2020-04-16 LAB — CBC
HCT: 34.5 % — ABNORMAL LOW (ref 39.0–52.0)
Hemoglobin: 10.9 g/dL — ABNORMAL LOW (ref 13.0–17.0)
MCH: 27.1 pg (ref 26.0–34.0)
MCHC: 31.6 g/dL (ref 30.0–36.0)
MCV: 85.8 fL (ref 80.0–100.0)
Platelets: 459 10*3/uL — ABNORMAL HIGH (ref 150–400)
RBC: 4.02 MIL/uL — ABNORMAL LOW (ref 4.22–5.81)
RDW: 14.1 % (ref 11.5–15.5)
WBC: 12.1 10*3/uL — ABNORMAL HIGH (ref 4.0–10.5)
nRBC: 0 % (ref 0.0–0.2)

## 2020-04-16 LAB — COMPREHENSIVE METABOLIC PANEL
ALT: 18 U/L (ref 0–44)
ALT: 16 U/L (ref 0–44)
AST: 21 U/L (ref 15–41)
AST: 20 U/L (ref 15–41)
Albumin: 1.9 g/dL — ABNORMAL LOW (ref 3.5–5.0)
Albumin: 1.8 g/dL — ABNORMAL LOW (ref 3.5–5.0)
Alkaline Phosphatase: 82 U/L (ref 38–126)
Alkaline Phosphatase: 88 U/L (ref 38–126)
Anion gap: 9 (ref 5–15)
Anion gap: 10 (ref 5–15)
BUN: 31 mg/dL — ABNORMAL HIGH (ref 6–20)
BUN: 31 mg/dL — ABNORMAL HIGH (ref 6–20)
CO2: 24 mmol/L (ref 22–32)
CO2: 24 mmol/L (ref 22–32)
Calcium: 9.1 mg/dL (ref 8.9–10.3)
Calcium: 9.1 mg/dL (ref 8.9–10.3)
Chloride: 106 mmol/L (ref 98–111)
Chloride: 108 mmol/L (ref 98–111)
Creatinine, Ser: 2.92 mg/dL — ABNORMAL HIGH (ref 0.61–1.24)
Creatinine, Ser: 2.7 mg/dL — ABNORMAL HIGH (ref 0.61–1.24)
GFR calc Af Amer: 28 mL/min — ABNORMAL LOW (ref >60)
GFR calc Af Amer: 30 mL/min — ABNORMAL LOW (ref >60)
GFR calc non Af Amer: 24 mL/min — ABNORMAL LOW (ref >60)
GFR calc non Af Amer: 26 mL/min — ABNORMAL LOW (ref >60)
Glucose, Bld: 227 mg/dL — ABNORMAL HIGH (ref 70–99)
Glucose, Bld: 159 mg/dL — ABNORMAL HIGH (ref 70–99)
Potassium: 3.8 mmol/L (ref 3.5–5.1)
Potassium: 3.6 mmol/L (ref 3.5–5.1)
Sodium: 139 mmol/L (ref 135–145)
Sodium: 142 mmol/L (ref 135–145)
Total Bilirubin: 0.6 mg/dL (ref 0.3–1.2)
Total Bilirubin: 0.6 mg/dL (ref 0.3–1.2)
Total Protein: 5.4 g/dL — ABNORMAL LOW (ref 6.5–8.1)
Total Protein: 5.4 g/dL — ABNORMAL LOW (ref 6.5–8.1)

## 2020-04-16 LAB — CBG MONITORING, ED
Glucose-Capillary: 142 mg/dL — ABNORMAL HIGH (ref 70–99)
Glucose-Capillary: 103 mg/dL — ABNORMAL HIGH (ref 70–99)
Glucose-Capillary: 158 mg/dL — ABNORMAL HIGH (ref 70–99)

## 2020-04-16 LAB — HEMOGLOBIN A1C
Hgb A1c MFr Bld: 9.7 % — ABNORMAL HIGH (ref 4.8–5.6)
Hgb A1c MFr Bld: 9.6 % — ABNORMAL HIGH (ref 4.8–5.6)
Mean Plasma Glucose: 231.69 mg/dL
Mean Plasma Glucose: 228.82 mg/dL

## 2020-04-16 LAB — URINALYSIS, ROUTINE W REFLEX MICROSCOPIC
Bilirubin Urine: NEGATIVE
Glucose, UA: >=500 mg/dL — AB
Ketones, ur: 5 mg/dL — AB
Leukocytes,Ua: NEGATIVE
Nitrite: NEGATIVE
Protein, ur: >=300 mg/dL — AB
Specific Gravity, Urine: 1.017 (ref 1.005–1.030)
pH: 6 (ref 5.0–8.0)

## 2020-04-16 LAB — SARS CORONAVIRUS 2 BY RT PCR (HOSPITAL ORDER, PERFORMED IN ~~LOC~~ HOSPITAL LAB): SARS Coronavirus 2: NEGATIVE

## 2020-04-16 LAB — TSH: TSH: 2.223 u[IU]/mL (ref 0.350–4.500)

## 2020-04-16 LAB — ETHANOL: Alcohol, Ethyl (B): <10 mg/dL (ref <10)

## 2020-04-16 LAB — ECHOCARDIOGRAM COMPLETE BUBBLE STUDY: S' Lateral: 2.4 cm

## 2020-04-16 LAB — APTT: aPTT: 27 seconds (ref 24–36)

## 2020-04-16 MED ORDER — INSULIN GLARGINE 100 UNIT/ML ~~LOC~~ SOLN
10.0000 [IU] | Freq: Every day | SUBCUTANEOUS | Status: DC
  Administered 2020-04-16 – 2020-04-22 (×7): 10 [IU] via SUBCUTANEOUS
  Filled 2020-04-16 (×7): qty 0.1

## 2020-04-16 MED ORDER — SODIUM CHLORIDE 0.9 % IV BOLUS
1000.0000 mL | Freq: Once | INTRAVENOUS | Status: AC
  Administered 2020-04-16: 1000 mL via INTRAVENOUS

## 2020-04-16 MED ORDER — PANTOPRAZOLE SODIUM 40 MG PO TBEC
40.0000 mg | DELAYED_RELEASE_TABLET | Freq: Every day | ORAL | Status: DC
  Administered 2020-04-16 – 2020-04-22 (×7): 40 mg via ORAL
  Filled 2020-04-16 (×7): qty 1

## 2020-04-16 MED ORDER — ACETAMINOPHEN 650 MG RE SUPP: 650.0000 mg | RECTAL | Status: DC | PRN

## 2020-04-16 MED ORDER — ASPIRIN 81 MG PO CHEW
81.0000 mg | CHEWABLE_TABLET | Freq: Every day | ORAL | Status: DC
  Administered 2020-04-17 – 2020-04-22 (×6): 81 mg via ORAL
  Filled 2020-04-16 (×6): qty 1

## 2020-04-16 MED ORDER — INSULIN ASPART 100 UNIT/ML ~~LOC~~ SOLN
0.0000 [IU] | Freq: Three times a day (TID) | SUBCUTANEOUS | Status: DC
  Administered 2020-04-16 – 2020-04-17 (×3): 1 [IU] via SUBCUTANEOUS
  Administered 2020-04-19 (×2): 2 [IU] via SUBCUTANEOUS
  Administered 2020-04-20 (×2): 1 [IU] via SUBCUTANEOUS
  Administered 2020-04-21: 3 [IU] via SUBCUTANEOUS
  Administered 2020-04-21 (×2): 1 [IU] via SUBCUTANEOUS
  Filled 2020-04-16: qty 0.09

## 2020-04-16 MED ORDER — ACETAMINOPHEN 160 MG/5ML PO SOLN: 650.0000 mg | ORAL | Status: DC | PRN

## 2020-04-16 MED ORDER — ATORVASTATIN CALCIUM 80 MG PO TABS
80.0000 mg | ORAL_TABLET | Freq: Every day | ORAL | Status: DC
  Administered 2020-04-17 – 2020-04-21 (×5): 80 mg via ORAL
  Filled 2020-04-16 (×5): qty 1

## 2020-04-16 MED ORDER — ENOXAPARIN SODIUM 40 MG/0.4ML ~~LOC~~ SOLN
40.0000 mg | SUBCUTANEOUS | Status: DC
  Administered 2020-04-16 – 2020-04-19 (×4): 40 mg via SUBCUTANEOUS
  Filled 2020-04-16 (×4): qty 0.4

## 2020-04-16 MED ORDER — STROKE: EARLY STAGES OF RECOVERY BOOK
Freq: Once | Status: DC
  Filled 2020-04-16 (×3): qty 1

## 2020-04-16 MED ORDER — ONDANSETRON HCL 4 MG/2ML IJ SOLN: 4.0000 mg | Freq: Four times a day (QID) | INTRAMUSCULAR | Status: DC | PRN

## 2020-04-16 MED ORDER — ASPIRIN 81 MG PO CHEW
324.0000 mg | CHEWABLE_TABLET | Freq: Once | ORAL | Status: AC
  Administered 2020-04-16: 324 mg via ORAL
  Filled 2020-04-16: qty 4

## 2020-04-16 MED ORDER — HYDRALAZINE HCL 20 MG/ML IJ SOLN
10.0000 mg | INTRAMUSCULAR | Status: DC | PRN
  Administered 2020-04-16 (×2): 10 mg via INTRAVENOUS
  Filled 2020-04-16 (×2): qty 1

## 2020-04-16 MED ORDER — ACETAMINOPHEN 325 MG PO TABS: 650.0000 mg | ORAL_TABLET | ORAL | Status: DC | PRN

## 2020-04-16 NOTE — ED Provider Notes (Signed)
[facility] DEPT Provider Note   CSN: [id number] Arrival date & time: 04/15/20  2254     History Chief Complaint  Patient presents with  . Weakness    Tony Schmitt is a 49 y.o. male.  49yo M w/ PMH including HTN, HLD, CKD, T2DM who p/w weakness.  Patient states that yesterday afternoon after he ate, he started noticing some left-sided weakness involving left arm and left leg.  He states that the weakness was mild and he eventually went on to bed last night.  This morning he woke up and the weakness was much worse.  He has been able to walk but with difficulty.  He reports some decreased sensation on the side.  He denies any headache, vision changes, nausea/vomiting, or other complaints.  He does note that this afternoon he had an episode of urinary incontinence.  He denies any neck or back pain or saddle anesthesia. He did not take medications this morning.  The history is provided by the patient.  Weakness      Past Medical History:  Diagnosis Date  . Diabetes mellitus without complication ([place])   . Hypertension     Patient Active Problem List   Diagnosis Date Noted  . Acute CVA (cerebrovascular accident) ([place]) 04/16/2020  . CKD (chronic kidney disease) stage 3, GFR 30-59 ml/min 04/23/2019  . Hyperlipidemia 04/18/2019  . Dyslipidemia associated with type 2 diabetes mellitus ([place]) 08/29/2018  . Hypertension associated with type 2 diabetes mellitus ([place]) 08/29/2018    History reviewed. No pertinent surgical history.     Family History  Problem Relation Age of Onset  . Diabetes Mother   . Diabetes Father   . Hypertension Father   . Cancer Father   . Heart failure Other     Social History   Tobacco Use  . Smoking status: Former Smoker    Types: Cigarettes, Cigars  . Smokeless tobacco: Never Used  Vaping Use  . Vaping Use: Never used  Substance Use Topics  . Alcohol use: Not Currently  . Drug use: Never    Home  Medications Prior to Admission medications   Medication Sig Start Date End Date Taking? Authorizing Provider  glipiZIDE (GLUCOTROL) 5 MG tablet TAKE 1 TABLET BY MOUTH TWICE DAILY BEFORE A MEAL 02/23/20  Yes Sagardia, Ines Bloomer, MD  lisinopril (ZESTRIL) 20 MG tablet Take 1 tablet (20 mg total) by mouth daily. 02/05/20  Yes Sagardia, Ines Bloomer, MD  amLODipine (NORVASC) 10 MG tablet Take 1 tablet (10 mg total) by mouth daily. 04/19/19   Rai, Vernelle Emerald, MD  metFORMIN (GLUCOPHAGE) 1000 MG tablet Take 1 tablet (1,000 mg total) by mouth 2 (two) times daily with a meal. 08/29/18   Sagardia, Ines Bloomer, MD  omeprazole (PRILOSEC) 40 MG capsule Take 1 capsule (40 mg total) by mouth daily. 04/16/19   Horald Pollen, MD  ondansetron (ZOFRAN ODT) 4 MG disintegrating tablet Take 1 tablet (4 mg total) by mouth every 8 (eight) hours as needed for nausea or vomiting. Patient not taking: Reported on 08/19/2019 04/19/19   Rai, Vernelle Emerald, MD  rosuvastatin (CRESTOR) 20 MG tablet Take 1 tablet (20 mg total) by mouth daily. 04/19/19   Mendel Corning, MD    Allergies    Patient has no known allergies.  Review of Systems   Review of Systems  Neurological: Positive for weakness.   All other systems reviewed and are negative except that which was mentioned in HPI  Physical  Exam Updated Vital Signs BP (!) 210/116   Pulse 96   Temp 98 F (36.7 C) (Oral)   Resp 11   SpO2 100%   Physical Exam Vitals and nursing note reviewed.  Constitutional:      General: He is not in acute distress.    Appearance: He is well-developed.     Comments: Awake, alert  HENT:     Head: Normocephalic and atraumatic.     Mouth/Throat:     Mouth: Mucous membranes are moist.     Pharynx: Oropharynx is clear.  Eyes:     Extraocular Movements: Extraocular movements intact.     Conjunctiva/sclera: Conjunctivae normal.     Pupils: Pupils are equal, round, and reactive to light.  Cardiovascular:     Rate and Rhythm: Normal  rate and regular rhythm.     Heart sounds: Normal heart sounds. No murmur heard.   Pulmonary:     Effort: Pulmonary effort is normal. No respiratory distress.     Breath sounds: Normal breath sounds.  Abdominal:     General: Bowel sounds are normal. There is no distension.     Palpations: Abdomen is soft.     Tenderness: There is no abdominal tenderness.  Musculoskeletal:     Cervical back: Neck supple.  Skin:    General: Skin is warm and dry.  Neurological:     Mental Status: He is alert and oriented to person, place, and time.     Cranial Nerves: No cranial nerve deficit.     Sensory: No sensory deficit.     Motor: Weakness present. No abnormal muscle tone.     Coordination: Coordination abnormal.     Deep Tendon Reflexes: Reflexes are normal and symmetric.     Comments: Fluent speech, 5/5 strength RUE and RLE; 3/5 strength LUE with severe dysmetria and 4/5 strength LLE with abnormal heel-to-shin testing Normal sensation throughout  Psychiatric:        Mood and Affect: Mood normal.        Thought Content: Thought content normal.     ED Results / Procedures / Treatments   Labs (all labs ordered are listed, but only abnormal results are displayed) Labs Reviewed  CBC WITH DIFFERENTIAL/PLATELET - Abnormal; Notable for the following components:      Result Value   WBC 12.0 (*)    RBC 3.90 (*)    Hemoglobin 10.7 (*)    HCT 33.3 (*)    Platelets 455 (*)    Neutro Abs 9.5 (*)    Abs Immature Granulocytes 0.14 (*)    All other components within normal limits  COMPREHENSIVE METABOLIC PANEL - Abnormal; Notable for the following components:   Glucose, Bld 227 (*)    BUN 31 (*)    Creatinine, Ser 2.92 (*)    Total Protein 5.4 (*)    Albumin 1.9 (*)    GFR calc non Af Amer 24 (*)    GFR calc Af Amer 28 (*)    All other components within normal limits  I-STAT CHEM 8, ED - Abnormal; Notable for the following components:   BUN 31 (*)    Creatinine, Ser 3.50 (*)    Glucose, Bld  218 (*)    Hemoglobin 10.5 (*)    HCT 31.0 (*)    All other components within normal limits  SARS CORONAVIRUS 2 BY RT PCR ([facility])  ETHANOL  PROTIME-INR  APTT  TSH  RAPID URINE  DRUG SCREEN, HOSP PERFORMED  URINALYSIS, ROUTINE W REFLEX MICROSCOPIC  LIPID PANEL  HEMOGLOBIN A1C    EKG EKG Interpretation  Date/Time:  Saturday April 16 2020 00:35:30 EDT Ventricular Rate:  93 PR Interval:    QRS Duration: 92 QT Interval:  367 QTC Calculation: 457 R Axis:   -6 Text Interpretation: Sinus rhythm Probable left atrial enlargement RSR' in V1 or V2, probably normal variant No significant change since last tracing Confirmed by Theotis Burrow [phone]) on 04/16/2020 12:49:52 AM   Radiology CT HEAD WO CONTRAST  Result Date: 04/16/2020 CLINICAL DATA:  Acute onset left-sided weakness for 1 day. Suspected stroke. EXAM: CT HEAD WITHOUT CONTRAST TECHNIQUE: Contiguous axial images were obtained from the base of the skull through the vertex without intravenous contrast. COMPARISON:  None. FINDINGS: Brain: Low-attenuation change in the right periventricular white matter suggesting acute infarct or possibly a mass lesion. Consider MRI for further evaluation. Old lacunar infarcts suggested in the left basal ganglia. No mass effect or midline shift. No abnormal extra-axial fluid collections. Gray-white matter junctions are distinct. Basal cisterns are not effaced. No acute intracranial hemorrhage. Vascular: No hyperdense vessel or unexpected calcification. Skull: Calvarium appears intact. Sinuses/Orbits: Paranasal sinuses are clear. Hypoaeration of the mastoid air cells with small mastoid effusions. Mucosal thickening in the right middle ear possibly representing inflammatory process or cholesteatoma. Other: None. IMPRESSION: 1. Low-attenuation change in the right periventricular white matter suggesting acute infarct or possibly a mass lesion. Consider MRI for  further evaluation. 2. Old lacunar infarcts in the left basal ganglia. 3. No acute intracranial hemorrhage. 4. Hypoaeration of the mastoid air cells with small mastoid effusions. Mucosal thickening in the right middle ear possibly representing inflammatory process or cholesteatoma. Electronically Signed   By: Lucienne Capers M.D.   On: 04/16/2020 01:02    Procedures Procedures (including critical care time)  Medications Ordered in ED Medications  atorvastatin (LIPITOR) tablet 80 mg (has no administration in time range)  aspirin chewable tablet 81 mg (has no administration in time range)  sodium chloride 0.9 % bolus 1,000 mL (1,000 mLs Intravenous New Bag/Given (Non-Interop) 04/16/20 0138)  aspirin chewable tablet 324 mg (324 mg Oral Given 04/16/20 0147)    ED Course  I have reviewed the triage vital signs and the nursing notes.  Pertinent labs & imaging results that were available during my care of the patient were reviewed by me and considered in my medical decision making (see chart for details).    MDM Rules/Calculators/A&P                          Awake and alert, hypertensive on exam.  Significant left-sided weakness concerning for stroke, outside window for TPA.  Lab work shows worsening renal failure with BUN 31, creatinine 2.92.  Glucose 227 with normal anion gap.  Head CT shows likely acute infarct versus mass lesion in the right periventricular white matter.  No hemorrhage.  Discussed with neurologist on-call Dr. Curly Sh[place], who recommended MRI and will see pt in consultation. Discussed admission to [facility] w/ [facility], Dr. Hal Hope.  Final Clinical Impression(s) / ED Diagnoses Final diagnoses:  Cerebrovascular accident (CVA), unspecified mechanism ([place])  Acute left-sided weakness    Rx / DC Orders ED Discharge Orders    None       Shakira Los, Wenda Overland, MD 04/16/20 [phone]

## 2020-04-16 NOTE — ED Notes (Signed)
Pt BP 194/104. Dr Verlon Au aware. Dr. Verlon Au confirmed parameters for PRN hydralazine to give if systolic greater than 076 or diastolic greater than 808. Will continue to monitor.

## 2020-04-16 NOTE — ED Notes (Signed)
Patient transported to MRI 

## 2020-04-16 NOTE — Plan of Care (Signed)
Consult called in overnight for stroke outside window for intervention or tPA. We will follow. If the patient can be timely transferred to [facility], we will see him at [facility], otherwise we will see him at [place] (schedule permitting) Work up to include Echo, A1c, Lipid panel, MRI brain and MRA head/neck (without contrast- given poor renal function) should keep going wherever he is and should not be delayed due to pending transfer. Please attempt to expedite his transfer to [facility] irrespective so that stroke team can see him in follow up. Will page primary MD and discuss.   -- Amie Portland, MD Neurology

## 2020-04-16 NOTE — Progress Notes (Signed)
  Echocardiogram 2D Echocardiogram has been performed.  Tony Schmitt 04/16/2020, 9:27 AM

## 2020-04-16 NOTE — Evaluation (Addendum)
Physical Therapy Evaluation Patient Details Name: Tony Schmitt MRN: [id number] DOB: 12-30-1970 Today's Date: 04/16/2020   History of Present Illness  49 y.o. male with history of hypertension and diabetes mellitus chronic and disease stage III presents to the ER after patient has been in left-sided weakness. CT head with multiple old lacunar infarcts in possible acute right periventricular white matter infarct.MRI brain was completed and revealed acute to subacute nonhemorrhagic infarcts in the posterior right corona radiata and anterior aspect of left external capsule-right-sided infarcts and possibly explain the imaging findings.  Clinical Impression  Pt seen in ED and awaiting bed placement for above diagnosis.  Pt currently with functional limitations due to the deficits listed below (see PT Problem List). Pt will benefit from skilled PT to increase their independence and safety with mobility to allow discharge to the venue listed below.  Pt requesting to use bathroom so assisted to [facility].  Pt able to perform pericare with min/guard assist for safety.  Pt then ambulated around room.  Pt requiring at least min assist for stability.  Pt with decreased strength and motor function in left UE and LE.  Pt would benefit from CIR prior to return home with his mother.      Follow Up Recommendations CIR    Equipment Recommendations  Other (comment) (possibly hemiwalker, TBD)    Recommendations for Other Services       Precautions / Restrictions Precautions Precautions: Fall      Mobility  Bed Mobility Overal bed mobility: Needs Assistance Bed Mobility: Supine to Sit;Sit to Supine     Supine to sit: Min assist Sit to supine: Min assist   General bed mobility comments: assist for upper body, poor control due to decreased strength L UE  Transfers Overall transfer level: Needs assistance Equipment used: 1 person hand held assist Transfers: Sit to/from Stand Sit to Stand: Min assist          General transfer comment: assist to rise and steady, cues for R UE to assist  Ambulation/Gait Ambulation/Gait assistance: Min assist Gait Distance (Feet): 25 Feet Assistive device: 1 person hand held assist Gait Pattern/deviations: Decreased step length - right;Decreased weight shift to left;Decreased dorsiflexion - left;Step-to pattern     General Gait Details: cues to use R UE for support (provided HHA), pt unable to grip well L hand, observed decreased L DF and pt at times dragging left foot, remained in room  Stairs            Wheelchair Mobility    Modified Rankin (Stroke Patients Only)       Balance Overall balance assessment: Needs assistance         Standing balance support: No upper extremity supported Standing balance-Leahy Scale: Fair Standing balance comment: not able to tolerate challenges without support               High Level Balance Comments: keeps weight shifted to stronger right side to balance             Pertinent Vitals/Pain Pain Assessment: No/denies pain    Home Living Family/patient expects to be discharged to:: Private residence Living Arrangements: Parent   Type of Home: House Home Access: Stairs to enter Entrance Stairs-Rails: Right Entrance Stairs-Number of Steps: 2 Home Layout: One level Home Equipment: Walker - 2 wheels      Prior Function Level of Independence: Independent               Hand Dominance  Dominant Hand: Right    Extremity/Trunk Assessment   Upper Extremity Assessment Upper Extremity Assessment: LUE deficits/detail LUE Deficits / Details: grossly 2+/5, no grip LUE Coordination: decreased fine motor;decreased gross motor    Lower Extremity Assessment Lower Extremity Assessment: LLE deficits/detail LLE Deficits / Details: grossly 2/5, decreased DF, denies decreased sensation LLE Coordination: decreased gross motor;decreased fine motor       Communication   Communication: No  difficulties  Cognition Arousal/Alertness: Awake/alert Behavior During Therapy: WFL for tasks assessed/performed Overall Cognitive Status: Within Functional Limits for tasks assessed                                        General Comments      Exercises     Assessment/Plan    PT Assessment Patient needs continued PT services  PT Problem List Decreased strength;Decreased activity tolerance;Decreased balance;Decreased knowledge of use of DME;Decreased mobility;Decreased coordination;Decreased safety awareness       PT Treatment Interventions DME instruction;Therapeutic activities;Functional mobility training;Neuromuscular re-education;Balance training;Stair training;Gait training;Therapeutic exercise;Patient/family education    PT Goals (Current goals can be found in the Care Plan section)  Acute Rehab PT Goals PT Goal Formulation: With patient Time For Goal Achievement: 04/30/20 Potential to Achieve Goals: Good    Frequency Min 4X/week   Barriers to discharge        Co-evaluation               AM-PAC PT "6 Clicks" Mobility  Outcome Measure Help needed turning from your back to your side while in a flat bed without using bedrails?: A Little Help needed moving from lying on your back to sitting on the side of a flat bed without using bedrails?: A Little Help needed moving to and from a bed to a chair (including a wheelchair)?: A Little Help needed standing up from a chair using your arms (e.g., wheelchair or bedside chair)?: A Little Help needed to walk in hospital room?: A Little Help needed climbing 3-5 steps with a railing? : A Lot 6 Click Score: 17    End of Session Equipment Utilized During Treatment: Gait belt Activity Tolerance: Patient tolerated treatment well Patient left: in bed;with call bell/phone within reach Nurse Communication: Mobility status PT Visit Diagnosis: Other abnormalities of gait and mobility (R26.89)    Time:  6578-4696 PT Time Calculation (min) (ACUTE ONLY): 35 min   Charges:   PT Evaluation $PT Eval Low Complexity: 1 Low PT Treatments $Gait Training: 8-22 mins      Arlyce Dice, DPT Acute Rehabilitation Services Pager: [phone] Office: [phone]  York Ram E 04/16/2020, 3:25 PM

## 2020-04-16 NOTE — H&P (Signed)
History and Physical    [name]:[id number] DOB: [birth year] DOA: 04/15/2020  PCP: Patient, No Pcp Per  Patient coming from: Home.  Chief Complaint: Left-sided weakness.  HPI: Tony Schmitt is a 49 y.o. male with history of hypertension and diabetes mellitus chronic and disease stage III presents to the ER after patient has been in left-sided weakness.  Patient's weakness started on April 14, 2020 evening which as per the patient was mild.  He woke up yesterday morning with more weakness and later in the evening when he tried to go to the bathroom he found it difficult.  He laid on the floor and called his mom to bring him to the ER.  Denies any visual symptoms or difficulty swallowing or speaking.  Patient did have one incidence of incontinence while at home.  ED Course: In the ER patient had a CT head which shows low-attenuation changes in the right periventricular white matter concerning for acute infarct versus mass lesion.  On-call neurology has been consulted patient admitted for possible acute CVA.  Patient blood pressures markedly elevated with systolic more than 742.  EKG shows normal sinus rhythm.  Labs are remarkable for creatinine increasing from 1.8-2.9.  Glucose of 223.  Covid test is negative.  Review of Systems: As per HPI, rest all negative.   Past Medical History:  Diagnosis Date  . Diabetes mellitus without complication ([place])   . Hypertension     History reviewed. No pertinent surgical history.   reports that he has quit smoking. His smoking use included cigarettes and cigars. He has never used smokeless tobacco. He reports previous alcohol use. He reports that he does not use drugs.  No Known Allergies  Family History  Problem Relation Age of Onset  . Diabetes Mother   . Diabetes Father   . Hypertension Father   . Cancer Father   . Heart failure Other     Prior to Admission medications   Medication Sig Start Date End Date Taking? Authorizing  Provider  glipiZIDE (GLUCOTROL) 5 MG tablet TAKE 1 TABLET BY MOUTH TWICE DAILY BEFORE A MEAL 02/23/20  Yes Sagardia, Ines Bloomer, MD  lisinopril (ZESTRIL) 20 MG tablet Take 1 tablet (20 mg total) by mouth daily. 02/05/20  Yes Sagardia, Ines Bloomer, MD  amLODipine (NORVASC) 10 MG tablet Take 1 tablet (10 mg total) by mouth daily. 04/19/19   Rai, Vernelle Emerald, MD  metFORMIN (GLUCOPHAGE) 1000 MG tablet Take 1 tablet (1,000 mg total) by mouth 2 (two) times daily with a meal. 08/29/18   Sagardia, Ines Bloomer, MD  omeprazole (PRILOSEC) 40 MG capsule Take 1 capsule (40 mg total) by mouth daily. 04/16/19   Horald Pollen, MD  ondansetron (ZOFRAN ODT) 4 MG disintegrating tablet Take 1 tablet (4 mg total) by mouth every 8 (eight) hours as needed for nausea or vomiting. Patient not taking: Reported on 08/19/2019 04/19/19   Rai, Vernelle Emerald, MD  rosuvastatin (CRESTOR) 20 MG tablet Take 1 tablet (20 mg total) by mouth daily. 04/19/19   Mendel Corning, MD    Physical Exam: Constitutional: Moderately built and nourished. Vitals:   04/16/20 0130 04/16/20 0145 04/16/20 0200 04/16/20 0215  BP: (!) 197/105 (!) 210/116 (!) 213/116 (!) 208/112  Pulse: 93 96 96 94  Resp: 10 11 12 15   Temp:      TempSrc:      SpO2: 100% 100% 100% 100%   Eyes: Anicteric no pallor. ENMT: No discharge from the ears  eyes nose or mouth. Neck: No mass felt.  No neck rigidity. Respiratory: No rhonchi or crepitations. Cardiovascular: S1-S2 heard. Abdomen: Soft nontender bowel sounds present. Musculoskeletal: No edema. Skin: No rash. Neurologic: Alert awake oriented to time place and person.  No facial asymmetry pupils equal and reactive to light tongue is midline.  Left upper and lower extremities are all 3 x 5 strength is able to lift against gravity.  Right upper and lower extremities 5 x 5 in strength. Psychiatric: Appears normal.  Normal affect.   Labs on Admission: I have personally reviewed following labs and imaging  studies  CBC: Recent Labs  Lab 04/16/20 0044 04/16/20 0045  WBC  --  12.0*  NEUTROABS  --  9.5*  HGB 10.5* 10.7*  HCT 31.0* 33.3*  MCV  --  85.4  PLT  --  202*   Basic Metabolic Panel: Recent Labs  Lab 04/16/20 0044 04/16/20 0045  NA 141 139  K 4.0 3.8  CL 110 106  CO2  --  24  GLUCOSE 218* 227*  BUN 31* 31*  CREATININE 3.50* 2.92*  CALCIUM  --  9.1   GFR: CrCl cannot be calculated (Unknown ideal weight.). Liver Function Tests: Recent Labs  Lab 04/16/20 0045  AST 21  ALT 18  ALKPHOS 82  BILITOT 0.6  PROT 5.4*  ALBUMIN 1.9*   No results for input(s): LIPASE, AMYLASE in the last 168 hours. No results for input(s): AMMONIA in the last 168 hours. Coagulation Profile: Recent Labs  Lab 04/16/20 0021  INR 1.0   Cardiac Enzymes: No results for input(s): CKTOTAL, CKMB, CKMBINDEX, TROPONINI in the last 168 hours. BNP (last 3 results) No results for input(s): PROBNP in the last 8760 hours. HbA1C: No results for input(s): HGBA1C in the last 72 hours. CBG: No results for input(s): GLUCAP in the last 168 hours. Lipid Profile: No results for input(s): CHOL, HDL, LDLCALC, TRIG, CHOLHDL, LDLDIRECT in the last 72 hours. Thyroid Function Tests: Recent Labs    04/16/20 0045  TSH 2.223   Anemia Panel: No results for input(s): VITAMINB12, FOLATE, FERRITIN, TIBC, IRON, RETICCTPCT in the last 72 hours. Urine analysis:    Component Value Date/Time   COLORURINE YELLOW 04/17/2019 1953   APPEARANCEUR CLEAR 04/17/2019 1953   LABSPEC 1.015 04/17/2019 1953   PHURINE 5.0 04/17/2019 1953   GLUCOSEU 50 (A) 04/17/2019 1953   HGBUR SMALL (A) 04/17/2019 [place] NEGATIVE 04/17/2019 [place] NEGATIVE 04/17/2019 1953   PROTEINUR >=300 (A) 04/17/2019 1953   NITRITE NEGATIVE 04/17/2019 1953   LEUKOCYTESUR NEGATIVE 04/17/2019 1953   Sepsis Labs: @LABRCNTIP (procalcitonin:4,lacticidven:4) ) Recent Results (from the past 240 hour(s))  SARS Coronavirus 2 by  RT PCR (hospital order, performed in [place] hospital lab) Nasopharyngeal Nasopharyngeal Swab     Status: None   Collection Time: 04/16/20  1:30 AM   Specimen: Nasopharyngeal Swab  Result Value Ref Range Status   SARS Coronavirus 2 NEGATIVE NEGATIVE Final    Comment: (NOTE) SARS-CoV-2 target nucleic acids are NOT DETECTED.  The SARS-CoV-2 RNA is generally detectable in upper and lower respiratory specimens during the acute phase of infection. The lowest concentration of SARS-CoV-2 viral copies this assay can detect is 250 copies / mL. A negative result does not preclude SARS-CoV-2 infection and should not be used as the sole basis for treatment or other patient management decisions.  A negative result may occur with improper specimen collection / handling, submission of specimen other than nasopharyngeal swab,  presence of viral mutation(s) within the areas targeted by this assay, and inadequate number of viral copies (<250 copies / mL). A negative result must be combined with clinical observations, patient history, and epidemiological information.  Fact Sheet for Patients:   StrictlyIdeas.no  Fact Sheet for Healthcare Providers: BankingDealers.co.za  This test is not yet approved or  cleared by the [place] FDA and has been authorized for detection and/or diagnosis of SARS-CoV-2 by FDA under an Emergency Use Authorization (EUA).  This EUA will remain in effect (meaning this test can be used) for the duration of the COVID-19 declaration under Section 564(b)(1) of the Act, 21 U.S.C. section 360bbb-3(b)(1), unless the authorization is terminated or revoked sooner.  Performed at [facility], [place] [address] [place], [place] [address]      Radiological Exams on Admission: CT HEAD WO CONTRAST  Result Date: 04/16/2020 CLINICAL DATA:  Acute onset left-sided weakness for 1 day. Suspected stroke. EXAM: CT HEAD  WITHOUT CONTRAST TECHNIQUE: Contiguous axial images were obtained from the base of the skull through the vertex without intravenous contrast. COMPARISON:  None. FINDINGS: Brain: Low-attenuation change in the right periventricular white matter suggesting acute infarct or possibly a mass lesion. Consider MRI for further evaluation. Old lacunar infarcts suggested in the left basal ganglia. No mass effect or midline shift. No abnormal extra-axial fluid collections. Gray-white matter junctions are distinct. Basal cisterns are not effaced. No acute intracranial hemorrhage. Vascular: No hyperdense vessel or unexpected calcification. Skull: Calvarium appears intact. Sinuses/Orbits: Paranasal sinuses are clear. Hypoaeration of the mastoid air cells with small mastoid effusions. Mucosal thickening in the right middle ear possibly representing inflammatory process or cholesteatoma. Other: None. IMPRESSION: 1. Low-attenuation change in the right periventricular white matter suggesting acute infarct or possibly a mass lesion. Consider MRI for further evaluation. 2. Old lacunar infarcts in the left basal ganglia. 3. No acute intracranial hemorrhage. 4. Hypoaeration of the mastoid air cells with small mastoid effusions. Mucosal thickening in the right middle ear possibly representing inflammatory process or cholesteatoma. Electronically Signed   By: Lucienne Capers M.D.   On: 04/16/2020 01:02    EKG: Independently reviewed.  Normal sinus rhythm.  Assessment/Plan Principal Problem:   Acute CVA (cerebrovascular accident) ([place]) Active Problems:   Dyslipidemia associated with type 2 diabetes mellitus (H[facility])   CKD (chronic kidney disease) stage 3, GFR 30-59 ml/min   Hypertensive urgency   Type 2 diabetes mellitus with vascular disease ([place])    1. Acute CVA -patient symptoms are concerning for acute CVA for which MRI brain has been ordered we will check carotid Doppler 2D echo monitoring telemetry physical therapy consult  patient passed swallow.  Check hemoglobin A1c lipid panel neurochecks.  Patient is on aspirin and statin.  Neurology has been consulted. 2. Hypertensive urgency will allow for permissive hypertension at this time we will keep patient on as needed IV hydralazine for systolic more than 130 and diastolic more than 865. 3. Diabetes mellitus type 2 with hyperglycemia likely will need long-acting insulin.  Check hemoglobin A1c for now I kept patient on sliding scale coverage.  If hemoglobin A1c is elevated will start on long-acting insulin. 4. Acute on chronic kidney disease stage III creatinine is worsened from baseline.  Will hold off patient's lisinopril for now we will follow metabolic panel closely.  Gently hydrate. 5. Anemia likely from renal disease follow CBC.  Given that patient has acute CVA with hypertensive urgency worsening renal function will need close monitoring for any  further worsening in inpatient status.   DVT prophylaxis: Heparin. Code Status: Full code. Family Communication: Discussed with patient. Disposition Plan: Home. Consults called: Neurology. Admission status: Inpatient.   Rise Patience MD [facility] Pager [phone].  If 7PM-7AM, please contact night-coverage [email or web address] Password [facility]  04/16/2020, 3:39 AM

## 2020-04-16 NOTE — Consult Note (Addendum)
NEURO HOSPITALIST  CONSULT   Requesting Physician: Dr. Verlon Au: [facility]    Chief Complaint: left side weakness  History obtained from:  Patient / chart  HPI:                                                                                                                                         Tony Schmitt is an 49 y.o. male  With PMH HTN, DM, HLD who presented to [facility] ED with one to 2 day history of numbness  And acute left side weakness. Initially said weakness started on Thursday but then says that he was having some symptoms Wednesday in the afternoon.  Per patient he started having numbness on Thursday 04/14/20. He didn't pay it much attention. At that time he says he did not notice any weakness. Her went ot bed woke up Friday and still had the same numbness. By Friday afternoon he had weakness on the left side. He noticed he was walking funny to the bathroom. Says he could not take his pants off, and he sat down on the ground in the bathroom and called for his mother. At this time he came to the hospital.  Denies any HA, dizziness, vision changes, ETOH use, smoking, drug use. Endorses missing medication doses. Does no take daily ASA.  No prior stroke history.   ED course:  CTH: no hemorrhage; old lacunar infarct left BG, periventricular white matter change suggestive of acute infarct.  Creatinine: 2.70 MRI brain: pending MRA had/ neck: pending Carotid US: pending ECHO: EF 60-65%, no atrial level shunting BG: 159 BP: 186/96  Date last known well: 04/14/20 Time last known well: afternoon tPA Given: No: outside of window  Modified Rankin: Rankin Score=1  NIHSS: 5 ( drift, dysarthria, droop)    Past Medical History:  Diagnosis Date   Diabetes mellitus without complication ([place])    Hypertension     History reviewed. No pertinent surgical history.  Family History  Problem Relation Age of Onset   Diabetes Mother    Diabetes  Father    Hypertension Father    Cancer Father    Heart failure Other          Social History:  reports that he has quit smoking. His smoking use included cigarettes and cigars. He has never used smokeless tobacco. He reports previous alcohol use. He reports that he does not use drugs.  Allergies: No Known Allergies  Medications:  Current Facility-Administered Medications  Medication Dose Route Frequency Provider Last Rate Last Admin    stroke: mapping our early stages of recovery book   Does not apply Once Rise Patience, MD       acetaminophen (TYLENOL) tablet 650 mg  650 mg Oral Q4H PRN Rise Patience, MD       Or   acetaminophen (TYLENOL) 160 MG/5ML solution 650 mg  650 mg Per Tube Q4H PRN Rise Patience, MD       Or   acetaminophen (TYLENOL) suppository 650 mg  650 mg Rectal Q4H PRN Rise Patience, MD       [START ON 04/17/2020] aspirin chewable tablet 81 mg  81 mg Oral Daily Rise Patience, MD       [START ON 04/17/2020] atorvastatin (LIPITOR) tablet 80 mg  80 mg Oral QHS Rise Patience, MD       enoxaparin (LOVENOX) injection 40 mg  40 mg Subcutaneous Q24H Rise Patience, MD   40 mg at 04/16/20 1005   hydrALAZINE (APRESOLINE) injection 10 mg  10 mg Intravenous Q4H PRN Rise Patience, MD   10 mg at 04/16/20 6222   insulin aspart (novoLOG) injection 0-9 Units  0-9 Units Subcutaneous TID WC Rise Patience, MD   1 Units at 04/16/20 0854   insulin glargine (LANTUS) injection 10 Units  10 Units Subcutaneous Daily Rise Patience, MD   10 Units at 04/16/20 1004   pantoprazole (PROTONIX) EC tablet 40 mg  40 mg Oral Daily Rise Patience, MD   40 mg at 04/16/20 1004   Current Outpatient Medications  Medication Sig Dispense Refill   glipiZIDE (GLUCOTROL) 5 MG tablet TAKE 1 TABLET BY MOUTH TWICE DAILY BEFORE A  MEAL 60 tablet 0   lisinopril (ZESTRIL) 20 MG tablet Take 1 tablet (20 mg total) by mouth daily. 90 tablet 0   amLODipine (NORVASC) 10 MG tablet Take 1 tablet (10 mg total) by mouth daily. 30 tablet 3   metFORMIN (GLUCOPHAGE) 1000 MG tablet Take 1 tablet (1,000 mg total) by mouth 2 (two) times daily with a meal. 180 tablet 3   omeprazole (PRILOSEC) 40 MG capsule Take 1 capsule (40 mg total) by mouth daily. 30 capsule 3   ondansetron (ZOFRAN ODT) 4 MG disintegrating tablet Take 1 tablet (4 mg total) by mouth every 8 (eight) hours as needed for nausea or vomiting. (Patient not taking: Reported on 08/19/2019) 20 tablet 0   rosuvastatin (CRESTOR) 20 MG tablet Take 1 tablet (20 mg total) by mouth daily. 90 tablet 3     ROS:                                                                                                                                       ROS was performed and is negative except as noted in HPI    General Examination:  Blood pressure (!) 181/102, pulse (!) 101, temperature 98.7 F (37.1 C), temperature source Oral, resp. rate 18, SpO2 99 %.  Physical Exam  Constitutional: Appears well-developed and well-nourished.  Psych: Affect appropriate to situation Eyes: Normal external eye and conjunctiva. HENT: Normocephalic, no lesions, without obvious abnormality.   Musculoskeletal-no joint tenderness, deformity or swelling Cardiovascular: Normal rate and regular rhythm.  Respiratory: Effort normal, non-labored breathing saturations WNL GI: Soft.  No distension. There is no tenderness.  Skin: WDI  Neurological Examination Mental Status: Alert, oriented, thought content appropriate.  Speech fluent without evidence of aphasia.  Able to follow  commands without difficulty. Cranial Nerves: II:  Visual fields grossly normal,  III,IV, VI: ptosis not present, extra-ocular motions intact  bilaterally, pupils equal, round, reactive to light and accommodation V,VII: smile asymmetric, slight left facial droop, facial light touch sensation normal bilaterally VIII: hearing normal bilaterally IX,X: uvula rises midline XI: bilateral shoulder shrug XII: midline tongue extension Motor: Right : Hand grips 5/5     Left:  Hand grips 2/5 Upper extremity   5/5         Upper extremity   3/5 Lower extremity   5/5  Lower extremity   4-/5 Dorsiflexion 5/5  Dorsiflexion 2/5 Plantar flexion 5/5   Plantar flexion 2/5 Tone and bulk:increased tone in LLE Sensory:  light touch intact throughout, bilaterally Deep Tendon Reflexes: 2+ in RUE and RLE, LUE and LLE hyperreflxic Cerebellar: normal finger-to-nose in RUE, impaired in LUE but no gross ataxia noted Gait: deferred   Lab Results: Basic Metabolic Panel: Recent Labs  Lab 04/16/20 0044 04/16/20 0045 04/16/20 0558  NA 141 139 142  K 4.0 3.8 3.6  CL 110 106 108  CO2  --  24 24  GLUCOSE 218* 227* 159*  BUN 31* 31* 31*  CREATININE 3.50* 2.92* 2.70*  CALCIUM  --  9.1 9.1    CBC: Recent Labs  Lab 04/16/20 0044 04/16/20 0045 04/16/20 0558  WBC  --  12.0* 12.1*  NEUTROABS  --  9.5*  --   HGB 10.5* 10.7* 10.9*  HCT 31.0* 33.3* 34.5*  MCV  --  85.4 85.8  PLT  --  455* 459*    Lipid Panel: Recent Labs  Lab 04/16/20 0045  CHOL 683*  TRIG 291*  HDL 41  CHOLHDL 16.7  VLDL 58*  LDLCALC 584*    CBG: Recent Labs  Lab 04/16/20 0752  GLUCAP 142*    Imaging: CT HEAD WO CONTRAST  Result Date: 04/16/2020 CLINICAL DATA:  Acute onset left-sided weakness for 1 day. Suspected stroke. EXAM: CT HEAD WITHOUT CONTRAST TECHNIQUE: Contiguous axial images were obtained from the base of the skull through the vertex without intravenous contrast. COMPARISON:  None. FINDINGS: Brain: Low-attenuation change in the right periventricular white matter suggesting acute infarct or possibly a mass lesion. Consider MRI for further evaluation. Old  lacunar infarcts suggested in the left basal ganglia. No mass effect or midline shift. No abnormal extra-axial fluid collections. Gray-white matter junctions are distinct. Basal cisterns are not effaced. No acute intracranial hemorrhage. Vascular: No hyperdense vessel or unexpected calcification. Skull: Calvarium appears intact. Sinuses/Orbits: Paranasal sinuses are clear. Hypoaeration of the mastoid air cells with small mastoid effusions. Mucosal thickening in the right middle ear possibly representing inflammatory process or cholesteatoma. Other: None. IMPRESSION: 1. Low-attenuation change in the right periventricular white matter suggesting acute infarct or possibly a mass lesion. Consider MRI for further evaluation. 2. Old lacunar infarcts in the left basal ganglia. 3.  No acute intracranial hemorrhage. 4. Hypoaeration of the mastoid air cells with small mastoid effusions. Mucosal thickening in the right middle ear possibly representing inflammatory process or cholesteatoma. Electronically Signed   By: Lucienne Capers M.D.   On: 04/16/2020 01:02   ECHOCARDIOGRAM COMPLETE BUBBLE STUDY  Result Date: 04/16/2020    ECHOCARDIOGRAM REPORT   Patient Name:   Tony Schmitt Date of Exam: 04/16/2020 Medical Rec #:  [id number]     Height:       64.0 in Accession #:    [id number]    Weight:       156.0 lb Date of Birth:  [birth year]     BSA:          1.760 m Patient Age:    13 years      BP:           199/106 mmHg Patient Gender: M             HR:           99 bpm. Exam Location:  Inpatient Procedure: 2D Echo Indications:    434.91 stroke  History:        Patient has prior history of Echocardiogram examinations, most                 recent 04/18/2019. Risk Factors:Hypertension, Diabetes and Former                 Smoker.  Sonographer:    Jannett Celestine RDCS (AE) Referring Phys: [place]  1. Left ventricular ejection fraction, by estimation, is 60 to 65%. The left ventricle has normal function. The  left ventricle has no regional wall motion abnormalities. There is mild concentric left ventricular hypertrophy. Left ventricular diastolic function could not be evaluated.  2. Right ventricular systolic function is normal. The right ventricular size is normal. Tricuspid regurgitation signal is inadequate for assessing PA pressure.  3. The mitral valve is normal in structure. No evidence of mitral valve regurgitation. No evidence of mitral stenosis.  4. The aortic valve is normal in structure. Aortic valve regurgitation is not visualized. No aortic stenosis is present.  5. The inferior vena cava is normal in size with greater than 50% respiratory variability, suggesting right atrial pressure of 3 mmHg. FINDINGS  Left Ventricle: Left ventricular ejection fraction, by estimation, is 60 to 65%. The left ventricle has normal function. The left ventricle has no regional wall motion abnormalities. The left ventricular internal cavity size was normal in size. There is  mild concentric left ventricular hypertrophy. Left ventricular diastolic function could not be evaluated. Right Ventricle: The right ventricular size is normal. No increase in right ventricular wall thickness. Right ventricular systolic function is normal. Tricuspid regurgitation signal is inadequate for assessing PA pressure. Left Atrium: Left atrial size was normal in size. Right Atrium: Right atrial size was normal in size. Pericardium: There is no evidence of pericardial effusion. Mitral Valve: The mitral valve is normal in structure. No evidence of mitral valve regurgitation. No evidence of mitral valve stenosis. Tricuspid Valve: The tricuspid valve is normal in structure. Tricuspid valve regurgitation is not demonstrated. No evidence of tricuspid stenosis. Aortic Valve: The aortic valve is normal in structure. Aortic valve regurgitation is not visualized. No aortic stenosis is present. Pulmonic Valve: The pulmonic valve was normal in structure.  Pulmonic valve regurgitation is not visualized. No evidence of pulmonic stenosis. Aorta: The aortic root is normal in size and structure. Venous: The  inferior vena cava is normal in size with greater than 50% respiratory variability, suggesting right atrial pressure of 3 mmHg. IAS/Shunts: No atrial level shunt detected by color flow Doppler.  LEFT VENTRICLE PLAX 2D LVIDd:         4.30 cm LVIDs:         2.40 cm LV PW:         1.20 cm LV IVS:        1.20 cm LVOT diam:     2.00 cm LV SV:         49 LV SV Index:   28 LVOT Area:     3.14 cm  RIGHT VENTRICLE RV S prime:     13.90 cm/s TAPSE (M-mode): 2.1 cm LEFT ATRIUM             Index LA diam:        2.90 cm 1.65 cm/m LA Vol (A2C):   26.8 ml 15.22 ml/m LA Vol (A4C):   49.3 ml 28.01 ml/m LA Biplane Vol: 36.5 ml 20.73 ml/m  AORTIC VALVE LVOT Vmax:   89.50 cm/s LVOT Vmean:  65.200 cm/s LVOT VTI:    0.157 m  AORTA Ao Root diam: 3.20 cm  SHUNTS Systemic VTI:  0.16 m Systemic Diam: 2.00 cm [name] MD Electronically signed by [name] MD Signature Date/Time: 04/16/2020/9:31:26 AM    Final    Laurey Morale, MSN, NP-C [facility] [phone]  04/16/2020, 10:05 AM

## 2020-04-16 NOTE — ED Notes (Signed)
[facility] called. 

## 2020-04-16 NOTE — Evaluation (Signed)
Occupational Therapy Evaluation Patient Details Name: Tony Schmitt MRN: [id number] DOB: March 25, 1971 Today's Date: 04/16/2020    History of Present Illness 49 y.o. male with history of hypertension and diabetes mellitus chronic and disease stage III presents to the ER after patient has been in left-sided weakness. CT head with multiple old lacunar infarcts in possible acute right periventricular white matter infarct.MRI brain was completed and revealed acute to subacute nonhemorrhagic infarcts in the posterior right corona radiata and anterior aspect of left external capsule-right-sided infarcts and possibly explain the imaging findings.   Clinical Impression   Tony Schmitt is a 49 year old man who presents with decreased ROM, strength and coordination of left upper and lower extremity and impaired balance resulting in decreased ability to perform independent functional mobility and ADLs. Patient demonstrates grossly 2+/5 muscle strength in upper extremity with trace movement in fingers. With attempts at upper extremity movement patient demonstrates abnormal muscle recruitment including bicep flexion and shoulder abduction requiring verbal and tactile cues to subdue in order to elicit specific muscle movement. Patient reports normal sensation and exhibits normal proprioception. Patient mod assist for supine to sit, min assist for standing, mod assist for dressing and min assist for bathing and toileting. Patient will benefit from skilled OT services to improve deficits and learn compensatory strategies as needed in order to return to PLOF. Patient educated on neuromuscular re-education principles, positioning of UE, and active assist stretching and lifting activities to perform.    Follow Up Recommendations  CIR    Equipment Recommendations   (TBD)    Recommendations for Other Services       Precautions / Restrictions Precautions Precautions: Fall Restrictions Weight Bearing Restrictions:  No      Mobility Bed Mobility Overal bed mobility: Needs Assistance Bed Mobility: Supine to Sit;Sit to Supine     Supine to sit: Mod assist Sit to supine: Min assist   General bed mobility comments: Mod assist for hand hold to pull up into sitting and guide legs to side of bed.  Transfers Overall transfer level: Needs assistance Equipment used: 1 person hand held assist Transfers: Sit to/from Stand Sit to Stand: Min assist         General transfer comment: Min assist to stand and take steps to head of bed.    Balance Overall balance assessment: Needs assistance Sitting-balance support: No upper extremity supported;Feet unsupported Sitting balance-Leahy Scale: Fair     Standing balance support: No upper extremity supported Standing balance-Leahy Scale: Fair Standing balance comment: not able to tolerate challenges without support. Balance loss with weight shiftng.               High Level Balance Comments: keeps weight shifted to stronger right side to balance           ADL either performed or assessed with clinical judgement   ADL Overall ADL's : Needs assistance/impaired Eating/Feeding: Set up   Grooming: Set up   Upper Body Bathing: Minimal assistance;Set up;Sitting   Lower Body Bathing: Minimal assistance;Set up;Sit to/from stand   Upper Body Dressing : Moderate assistance;Set up;Sitting   Lower Body Dressing: Moderate assistance;Sit to/from stand Lower Body Dressing Details (indicate cue type and reason): Unable to donn socks at edge of stretcher. Performed pseudo pant activity and needed min assist. Toilet Transfer: Minimal assistance;Stand-pivot;BSC   Toileting- Clothing Manipulation and Hygiene: Minimal assistance Toileting - Clothing Manipulation Details (indicate cue type and reason): Reports wiping himself, partial assistance needed for clothing management  Vision Patient Visual Report: No change from baseline        Perception     Praxis      Pertinent Vitals/Pain Pain Assessment: No/denies pain     Hand Dominance Right   Extremity/Trunk Assessment Upper Extremity Assessment Upper Extremity Assessment: LUE deficits/detail;RUE deficits/detail RUE Deficits / Details: WFL ROM, 5/5 Strength throughout RUE Sensation: WNL RUE Coordination: WNL LUE Deficits / Details: Shoulder 2+/5, Elbow 2+/5, wrist 2+/5, Fingers 1/5. Abnormal muscle recruitment requiring tactile cues to subdue in order to elicit muscle movement asked for. LUE Sensation: WNL LUE Coordination: decreased fine motor;decreased gross motor   Lower Extremity Assessment Lower Extremity Assessment: Defer to PT evaluation LLE Deficits / Details: grossly 2/5, decreased DF, denies decreased sensation LLE Coordination: decreased gross motor;decreased fine motor   Cervical / Trunk Assessment Cervical / Trunk Assessment: Normal   Communication Communication Communication: No difficulties   Cognition Arousal/Alertness: Awake/alert Behavior During Therapy: WFL for tasks assessed/performed Overall Cognitive Status: Within Functional Limits for tasks assessed                                     General Comments       Exercises Other Exercises Other Exercises: Education on neuromuscular principles - educated to keep arm stretched and demonstrated how.   Shoulder Instructions      Home Living Family/patient expects to be discharged to:: Private residence Living Arrangements: Parent   Type of Home: House Home Access: Stairs to enter Technical brewer of Steps: 2 Entrance Stairs-Rails: Right Home Layout: One level               Home Equipment: Walker - 2 wheels   Additional Comments: tub/shower      Prior Functioning/Environment Level of Independence: Independent                 OT Problem List: Decreased strength;Decreased range of motion;Impaired balance (sitting and/or standing);Decreased  coordination;Decreased knowledge of use of DME or AE;Impaired UE functional use      OT Treatment/Interventions: Self-care/ADL training;Therapeutic exercise;Neuromuscular education;DME and/or AE instruction;Therapeutic activities;Splinting;Patient/family education;Balance training;Modalities;Manual therapy    OT Goals(Current goals can be found in the care plan section) Acute Rehab OT Goals Patient Stated Goal: To improve strength OT Goal Formulation: With patient Time For Goal Achievement: 04/30/20 Potential to Achieve Goals: Good  OT Frequency: Min 2X/week   Barriers to D/C:            Co-evaluation              AM-PAC OT "6 Clicks" Daily Activity     Outcome Measure Help from another person eating meals?: A Little Help from another person taking care of personal grooming?: A Little Help from another person toileting, which includes using toliet, bedpan, or urinal?: A Little Help from another person bathing (including washing, rinsing, drying)?: A Little Help from another person to put on and taking off regular upper body clothing?: A Lot Help from another person to put on and taking off regular lower body clothing?: A Lot 6 Click Score: 16   End of Session Equipment Utilized During Treatment: Gait belt Nurse Communication:  (okay to see per Nursing)  Activity Tolerance: Patient tolerated treatment well Patient left: in bed;with call bell/phone within reach  OT Visit Diagnosis: Unsteadiness on feet (R26.81);Hemiplegia and hemiparesis Hemiplegia - Right/Left: Left Hemiplegia - dominant/non-dominant: Non-Dominant Hemiplegia - caused by: Cerebral infarction  Time: 1587-2761 OT Time Calculation (min): 25 min Charges:  OT General Charges $OT Visit: 1 Visit OT Evaluation $OT Eval Moderate Complexity: 1 Mod  Kaidon Kinker, OTR/L [place]  Office [phone] Pager: [place] 04/16/2020, 4:42 PM

## 2020-04-16 NOTE — ED Notes (Signed)
[facility] bedside.  

## 2020-04-16 NOTE — Progress Notes (Signed)
Seen and examined and agree with plan of care as per Dr. Hal Hope saw the patient this morning He has a right-sided stroke MRI is pending as is echo and carotid duplex He is passed a swallow eval ate breakfast I had a long discussion with his mother at the bedside His LDL is over 599 so he will need very high intensity statin probably Crestor 40, his A1c is 9.6 indicating very poor control at baseline His mother admits as she lives with him that he does not control his sugars Other modifiable risk factors are occasional drinking which she has not done since 2019 he has not had any cigarettes in a while  Anticipate further disposition as per therapy services although he has significant left arm upper extremity deficits  Stable for transfer to telemetry bed whenever able Given his young age may need cryptogenic stroke work-up-Defer to neurology and appreciate their input  Verneita Griffes, MD [facility] 12:03 PM

## 2020-04-17 LAB — BASIC METABOLIC PANEL
Anion gap: 7 (ref 5–15)
BUN: 29 mg/dL — ABNORMAL HIGH (ref 6–20)
CO2: 22 mmol/L (ref 22–32)
Calcium: 9 mg/dL (ref 8.9–10.3)
Chloride: 108 mmol/L (ref 98–111)
Creatinine, Ser: 3.01 mg/dL — ABNORMAL HIGH (ref 0.61–1.24)
GFR calc Af Amer: 27 mL/min — ABNORMAL LOW (ref >60)
GFR calc non Af Amer: 23 mL/min — ABNORMAL LOW (ref >60)
Glucose, Bld: 132 mg/dL — ABNORMAL HIGH (ref 70–99)
Potassium: 3.3 mmol/L — ABNORMAL LOW (ref 3.5–5.1)
Sodium: 137 mmol/L (ref 135–145)

## 2020-04-17 LAB — CBC
HCT: 33.1 % — ABNORMAL LOW (ref 39.0–52.0)
Hemoglobin: 10.6 g/dL — ABNORMAL LOW (ref 13.0–17.0)
MCH: 27.1 pg (ref 26.0–34.0)
MCHC: 32 g/dL (ref 30.0–36.0)
MCV: 84.7 fL (ref 80.0–100.0)
Platelets: 434 10*3/uL — ABNORMAL HIGH (ref 150–400)
RBC: 3.91 MIL/uL — ABNORMAL LOW (ref 4.22–5.81)
RDW: 14.4 % (ref 11.5–15.5)
WBC: 10.8 10*3/uL — ABNORMAL HIGH (ref 4.0–10.5)
nRBC: 0 % (ref 0.0–0.2)

## 2020-04-17 LAB — BASIC METABOLIC PANEL WITH GFR
Anion gap: 10 (ref 5–15)
BUN: 30 mg/dL — ABNORMAL HIGH (ref 6–20)
CO2: 16 mmol/L — ABNORMAL LOW (ref 22–32)
Calcium: 9.1 mg/dL (ref 8.9–10.3)
Chloride: 110 mmol/L (ref 98–111)
Creatinine, Ser: 3.08 mg/dL — ABNORMAL HIGH (ref 0.61–1.24)
GFR calc Af Amer: 26 mL/min — ABNORMAL LOW
GFR calc non Af Amer: 22 mL/min — ABNORMAL LOW
Glucose, Bld: 107 mg/dL — ABNORMAL HIGH (ref 70–99)
Potassium: 5.7 mmol/L — ABNORMAL HIGH (ref 3.5–5.1)
Sodium: 136 mmol/L (ref 135–145)

## 2020-04-17 LAB — GLUCOSE, CAPILLARY
Glucose-Capillary: 140 mg/dL — ABNORMAL HIGH (ref 70–99)
Glucose-Capillary: 89 mg/dL (ref 70–99)
Glucose-Capillary: 139 mg/dL — ABNORMAL HIGH (ref 70–99)
Glucose-Capillary: 136 mg/dL — ABNORMAL HIGH (ref 70–99)

## 2020-04-17 LAB — MAGNESIUM: Magnesium: 1.8 mg/dL (ref 1.7–2.4)

## 2020-04-17 MED ORDER — SODIUM CHLORIDE 0.9 % IV SOLN: INTRAVENOUS | Status: DC

## 2020-04-17 MED ORDER — SODIUM BICARBONATE 650 MG PO TABS
1300.0000 mg | ORAL_TABLET | Freq: Two times a day (BID) | ORAL | Status: DC
  Administered 2020-04-17 – 2020-04-22 (×11): 1300 mg via ORAL
  Filled 2020-04-17 (×11): qty 2

## 2020-04-17 NOTE — Plan of Care (Signed)
  Problem: Education: Goal: Knowledge of disease or condition will improve Outcome: Progressing Goal: Knowledge of secondary prevention will improve Outcome: Progressing Goal: Knowledge of patient specific risk factors addressed and post discharge goals established will improve Outcome: Progressing Goal: Individualized Educational Video(s) Outcome: Progressing   Problem: Coping: Goal: Will verbalize positive feelings about self Outcome: Progressing   Problem: Self-Care: Goal: Ability to participate in self-care as condition permits will improve Outcome: Progressing Goal: Verbalization of feelings and concerns over difficulty with self-care will improve Outcome: Progressing   Problem: Nutrition: Goal: Risk of aspiration will decrease Outcome: Progressing   Problem: Ischemic Stroke/TIA Tissue Perfusion: Goal: Complications of ischemic stroke/TIA will be minimized Outcome: Progressing

## 2020-04-17 NOTE — Progress Notes (Signed)
Inpatient Rehab Admissions Coordinator Note:   Per PT/OT recommendations, pt was screened for CIR candidacy by Gayland Curry, MS, CCC-SLP.  At this time we are recommending an Inpatient Rehab consult. AC will place consult order per protocol.  Please contact me with questions.    Gayland Curry, [place], [place] Admissions Coordinator [phone] 04/17/20 5:07 PM

## 2020-04-17 NOTE — Progress Notes (Signed)
PROGRESS NOTE    Tony Schmitt  UXN:[id number] DOB: 1970-12-02 DOA: 04/15/2020 PCP: No primary care provider on file.   Brief Narrative: 49 year old PMH hypertension, diabetes, CKD stage IIIa who presents to the ED with left-sided weakness. Patient weakness a started on September 04/2020 evening but it was mild. He wake up on 9/10 with worsening weakness. He was not able to stand up from the floor and call his mom. Evaluation in the ED CT head showed low-attenuation changes in the right periventricular white matter concerning for acute infarct versus mass lesion. Systolic blood pressure markedly elevated more than 200.  Assessment & Plan:   Principal Problem:   Acute CVA (cerebrovascular accident) ([place]) Active Problems:   Dyslipidemia associated with type 2 diabetes mellitus (HCC)   CKD (chronic kidney disease) stage 3, GFR 30-59 ml/min   Hypertensive urgency   Type 2 diabetes mellitus with vascular disease ([place])   1-Acute CVA, involve posterior right corona and anterior aspect of left external capsule. -MRI; acute subacute nonhemorrhagic infarct involving posterior right corona radiata  and anterior aspect of the left external capsule. Right-sided infarct likely impact corticospinal tracts involving left  motor function. Punctate cortical infarct along the inferior left frontal operculum.  -MRA; moderate to high-grade stenosis at the junction of the right V3 and V4 segment with segmental narrowing of the V4 segment above this level. Moderate proximal basilar artery stenosis. Atherosclerotic irregularity in the cavernous internal carotid arteries bilaterally with 50 to 60% stenosis of the supraclinoid right ICA. Moderate stenosis of the right posterior communicating artery with asymmetric attenuation of distal PCA branch vessel on the right. -on Lipitor, aspirin. Further anticoagulation recommendation per Neurology./  -Neurology recommend TEE, plan for TEE tomorrow.   2-CKD stage  IIIa; Prior cr 1.8 Presents with Cr :3.5--2.9 Start IV fluids, cr increase to 3 Metabolic acidosis, start sodium bicarb tablet.   3-Hyperlipidemia;  LDL; 584. Started on lipitor.   4-Diabetes Type 2;  Hb-A1c; 9.7 On low dose lantus. Continue.   5-Hyperkalemia; Hemolysis on B-met Repeat Stat.  EKG  6-Leukocytosis; trending down.  Follow urine culture.   Estimated body mass index is 27.7 kg/m as calculated from the following:   Height as of this encounter: '5\' 4"'  (1.626 m).   Weight as of this encounter: 73.2 kg.   DVT prophylaxis: Lovenox Code Status: Full code Family Communication: Care discussed with patient Disposition Plan:  Status is: Inpatient  Remains inpatient appropriate because:Ongoing diagnostic testing needed not appropriate for outpatient work up   Dispo: The patient is from: Home              Anticipated d/c is to: to be determine              Anticipated d/c date is: 2 days              Patient currently is not medically stable to d/c.        Consultants:   Neurology   Procedures:   ECHO  TEE  Antimicrobials:    Subjective: He is feeling ok, left side weakness persist   Objective: Vitals:   04/16/20 2100 04/16/20 2145 04/16/20 2344 04/17/20 0404  BP: (!) 176/109 (!) 172/97 (!) 177/99 (!) 184/100  Pulse: (!) 112 (!) 104 (!) 103 100  Resp: '18 18 19 19  ' Temp: 98.2 F (36.8 C) 98.4 F (36.9 C) 98.6 F (37 C) 98.8 F (37.1 C)  TempSrc:  Oral Oral Oral  SpO2: 99% 100%  100% 98%  Weight:  73.2 kg    Height:  '5\' 4"'  (1.626 m)     No intake or output data in the 24 hours ending 04/17/20 0659 Filed Weights   04/16/20 2145  Weight: 73.2 kg    Examination:  General exam: Appears calm and comfortable  Respiratory system: Clear to auscultation. Respiratory effort normal. Cardiovascular system: S1 & S2 heard, RRR. No JVD, murmurs, rubs, gallops or clicks. No pedal edema. Gastrointestinal system: Abdomen is nondistended, soft and  nontender. No organomegaly or masses felt. Normal bowel sounds heard. Central nervous system: Alert and oriented. No focal neurological deficits. Extremities: left side upper 2/5 LE 2 -3/5   Data Reviewed: I have personally reviewed following labs and imaging studies  CBC: Recent Labs  Lab 04/16/20 0044 04/16/20 0045 04/16/20 0558  WBC  --  12.0* 12.1*  NEUTROABS  --  9.5*  --   HGB 10.5* 10.7* 10.9*  HCT 31.0* 33.3* 34.5*  MCV  --  85.4 85.8  PLT  --  455* 250*   Basic Metabolic Panel: Recent Labs  Lab 04/16/20 0044 04/16/20 0045 04/16/20 0558  NA 141 139 142  K 4.0 3.8 3.6  CL 110 106 108  CO2  --  24 24  GLUCOSE 218* 227* 159*  BUN 31* 31* 31*  CREATININE 3.50* 2.92* 2.70*  CALCIUM  --  9.1 9.1   GFR: Estimated Creatinine Clearance: 30 mL/min (A) (by C-G formula based on SCr of 2.7 mg/dL (H)). Liver Function Tests: Recent Labs  Lab 04/16/20 0045 04/16/20 0558  AST 21 20  ALT 18 16  ALKPHOS 82 88  BILITOT 0.6 0.6  PROT 5.4* 5.4*  ALBUMIN 1.9* 1.8*   No results for input(s): LIPASE, AMYLASE in the last 168 hours. No results for input(s): AMMONIA in the last 168 hours. Coagulation Profile: Recent Labs  Lab 04/16/20 0021  INR 1.0   Cardiac Enzymes: No results for input(s): CKTOTAL, CKMB, CKMBINDEX, TROPONINI in the last 168 hours. BNP (last 3 results) No results for input(s): PROBNP in the last 8760 hours. HbA1C: Recent Labs    04/16/20 0045 04/16/20 0558  HGBA1C 9.7* 9.6*   CBG: Recent Labs  Lab 04/16/20 0752 04/16/20 1152 04/16/20 1746 04/16/20 2248 04/17/20 0617  GLUCAP 142* 103* 158* 179* 140*   Lipid Profile: Recent Labs    04/16/20 0045 04/16/20 0558  CHOL 683* 699*  HDL 41 40*  LDLCALC 584* 599*  TRIG 291* 298*  CHOLHDL 16.7 17.5   Thyroid Function Tests: Recent Labs    04/16/20 0045  TSH 2.223   Anemia Panel: No results for input(s): VITAMINB12, FOLATE, FERRITIN, TIBC, IRON, RETICCTPCT in the last 72 hours. Sepsis  Labs: No results for input(s): PROCALCITON, LATICACIDVEN in the last 168 hours.  Recent Results (from the past 240 hour(s))  SARS Coronavirus 2 by RT PCR (hospital order, performed in [facility] hospital lab) Nasopharyngeal Nasopharyngeal Swab     Status: None   Collection Time: 04/16/20  1:30 AM   Specimen: Nasopharyngeal Swab  Result Value Ref Range Status   SARS Coronavirus 2 NEGATIVE NEGATIVE Final    Comment: (NOTE) SARS-CoV-2 target nucleic acids are NOT DETECTED.  The SARS-CoV-2 RNA is generally detectable in upper and lower respiratory specimens during the acute phase of infection. The lowest concentration of SARS-CoV-2 viral copies this assay can detect is 250 copies / mL. A negative result does not preclude SARS-CoV-2 infection and should not be used as the sole basis  for treatment or other patient management decisions.  A negative result may occur with improper specimen collection / handling, submission of specimen other than nasopharyngeal swab, presence of viral mutation(s) within the areas targeted by this assay, and inadequate number of viral copies (<250 copies / mL). A negative result must be combined with clinical observations, patient history, and epidemiological information.  Fact Sheet for Patients:   StrictlyIdeas.no  Fact Sheet for Healthcare Providers: BankingDealers.co.za  This test is not yet approved or  cleared by the [place] FDA and has been authorized for detection and/or diagnosis of SARS-CoV-2 by FDA under an Emergency Use Authorization (EUA).  This EUA will remain in effect (meaning this test can be used) for the duration of the COVID-19 declaration under Section 564(b)(1) of the Act, 21 U.S.C. section 360bbb-3(b)(1), unless the authorization is terminated or revoked sooner.  Performed at [facility], [place] [address], [place], [place] [address]          Radiology  Studies: CT HEAD WO CONTRAST  Result Date: 04/16/2020 CLINICAL DATA:  Acute onset left-sided weakness for 1 day. Suspected stroke. EXAM: CT HEAD WITHOUT CONTRAST TECHNIQUE: Contiguous axial images were obtained from the base of the skull through the vertex without intravenous contrast. COMPARISON:  None. FINDINGS: Brain: Low-attenuation change in the right periventricular white matter suggesting acute infarct or possibly a mass lesion. Consider MRI for further evaluation. Old lacunar infarcts suggested in the left basal ganglia. No mass effect or midline shift. No abnormal extra-axial fluid collections. Gray-white matter junctions are distinct. Basal cisterns are not effaced. No acute intracranial hemorrhage. Vascular: No hyperdense vessel or unexpected calcification. Skull: Calvarium appears intact. Sinuses/Orbits: Paranasal sinuses are clear. Hypoaeration of the mastoid air cells with small mastoid effusions. Mucosal thickening in the right middle ear possibly representing inflammatory process or cholesteatoma. Other: None. IMPRESSION: 1. Low-attenuation change in the right periventricular white matter suggesting acute infarct or possibly a mass lesion. Consider MRI for further evaluation. 2. Old lacunar infarcts in the left basal ganglia. 3. No acute intracranial hemorrhage. 4. Hypoaeration of the mastoid air cells with small mastoid effusions. Mucosal thickening in the right middle ear possibly representing inflammatory process or cholesteatoma. Electronically Signed   By: Lucienne Capers M.D.   On: 04/16/2020 01:02   MR ANGIO HEAD WO CONTRAST  Result Date: 04/16/2020 CLINICAL DATA:  New onset of left upper extremity weakness. Stroke symptoms have progressed significantly overnight. EXAM: MRI HEAD WITHOUT CONTRAST MRA HEAD WITHOUT CONTRAST MRA NECK WITHOUT CONTRAST TECHNIQUE: Multiplanar, multiecho pulse sequences of the brain and surrounding structures were obtained without intravenous contrast.  Angiographic images of the Circle of Willis were obtained using MRA technique without intravenous contrast. Angiographic images of the neck were obtained using MRA technique without intravenous contrast. Carotid stenosis measurements (when applicable) are obtained utilizing NASCET criteria, using the distal internal carotid diameter as the denominator. COMPARISON:  CT head without contrast 04/16/2020 FINDINGS: MRI HEAD FINDINGS Brain: The diffusion-weighted images confirm acute/subacute nonhemorrhagic infarcts involving the posterior right corona radiata. White matter infarct in the anterior aspect of the left external capsule, just anterior to the lentiform nucleus measures up to 13 mm. Punctate cortical infarct is present along the inferior left frontal operculum. T2 signal changes are associated with each of these areas. More remote lacunar infarct is present at the left globus pallidus. The basal ganglia are otherwise intact Periventricular and subcortical T2 signal changes are otherwise mildly advanced for age. The internal auditory canals are within  normal limits. The brainstem and cerebellum are within normal limits. Vascular: The distal left vertebral artery is not visualized. Flow is otherwise present in the major intracranial arteries. Skull and upper cervical spine: Craniocervical junction is normal. Flow is present in the vertebral arteries bilaterally. Visualized intracranial contents are normal. Sinuses/Orbits: Right greater than left mastoid effusion is present. The paranasal sinuses and mastoid air cells are otherwise clear. The globes and orbits are within normal limits. MRA HEAD FINDINGS Atherosclerotic irregularity is present in the cavernous internal carotid arteries bilaterally. 50-60% stenosis is present in the supraclinoid right ICA. No other significant stenoses are present through the ICA termini. The anterior communicating artery is patent. MCA bifurcations are intact. Asymmetric  attenuation of distal left MCA branch vessels is noted without a significant proximal stenosis or occlusion. No flow signal is present in the left vertebral artery. Moderate to high-grade stenosis is present at the junction at the V3 and V4 segments with segmental narrowing of the V4 segment above this level. Moderate proximal basilar artery stenosis is present. Basilar artery is small, terminating at the superior cerebellar arteries. Both posterior cerebral arteries are of fetal type. Moderate stenoses are present in the right posterior communicating artery with asymmetric attenuation of distal PCA branch vessels on the right. MRA NECK FINDINGS Time of flight imaging demonstrates common origin of the left common carotid artery and innominate artery. No significant proximal stenoses are present. MCA bifurcations are intact without significant flow disturbance. Flow is antegrade in the vertebral arteries bilaterally. The right vertebral artery is the dominant vessel. No flow is present in the distal left vertebral artery above the craniocervical junction. IMPRESSION: 1. Acute/subacute nonhemorrhagic infarcts involving the posterior right corona radiata and anterior aspect of the left external capsule, corresponding with patient's symptoms. Right-sided infarcts likely impacts cortical spinal tracts involving left motor function. 2. Punctate cortical infarct along the inferior left frontal operculum. 3. Periventricular and subcortical T2 signal changes are otherwise mildly advanced for age. This likely reflects the sequela of chronic microvascular ischemia. 4. No flow signal in the distal left vertebral artery above the craniocervical junction. 5. Moderate to high-grade stenosis at the junction at the right V3 and V4 segments with segmental narrowing of the V4 segment above this level. 6. Moderate proximal basilar artery stenosis. 7. Atherosclerotic irregularity in the cavernous internal carotid arteries bilaterally  with a 50-60% stenosis in the supraclinoid right ICA. 8. Moderate stenoses of the right posterior communicating artery with asymmetric attenuation of distal PCA branch vessels on the right. The right posterior cerebral artery is of fetal type. 9. No significant proximal stenosis in the neck. Electronically Signed   By: San Morelle M.D.   On: 04/16/2020 12:19   MR ANGIO NECK WO CONTRAST  Result Date: 04/16/2020 CLINICAL DATA:  New onset of left upper extremity weakness. Stroke symptoms have progressed significantly overnight. EXAM: MRI HEAD WITHOUT CONTRAST MRA HEAD WITHOUT CONTRAST MRA NECK WITHOUT CONTRAST TECHNIQUE: Multiplanar, multiecho pulse sequences of the brain and surrounding structures were obtained without intravenous contrast. Angiographic images of the Circle of Willis were obtained using MRA technique without intravenous contrast. Angiographic images of the neck were obtained using MRA technique without intravenous contrast. Carotid stenosis measurements (when applicable) are obtained utilizing NASCET criteria, using the distal internal carotid diameter as the denominator. COMPARISON:  CT head without contrast 04/16/2020 FINDINGS: MRI HEAD FINDINGS Brain: The diffusion-weighted images confirm acute/subacute nonhemorrhagic infarcts involving the posterior right corona radiata. White matter infarct in the anterior aspect of  the left external capsule, just anterior to the lentiform nucleus measures up to 13 mm. Punctate cortical infarct is present along the inferior left frontal operculum. T2 signal changes are associated with each of these areas. More remote lacunar infarct is present at the left globus pallidus. The basal ganglia are otherwise intact Periventricular and subcortical T2 signal changes are otherwise mildly advanced for age. The internal auditory canals are within normal limits. The brainstem and cerebellum are within normal limits. Vascular: The distal left vertebral artery is  not visualized. Flow is otherwise present in the major intracranial arteries. Skull and upper cervical spine: Craniocervical junction is normal. Flow is present in the vertebral arteries bilaterally. Visualized intracranial contents are normal. Sinuses/Orbits: Right greater than left mastoid effusion is present. The paranasal sinuses and mastoid air cells are otherwise clear. The globes and orbits are within normal limits. MRA HEAD FINDINGS Atherosclerotic irregularity is present in the cavernous internal carotid arteries bilaterally. 50-60% stenosis is present in the supraclinoid right ICA. No other significant stenoses are present through the ICA termini. The anterior communicating artery is patent. MCA bifurcations are intact. Asymmetric attenuation of distal left MCA branch vessels is noted without a significant proximal stenosis or occlusion. No flow signal is present in the left vertebral artery. Moderate to high-grade stenosis is present at the junction at the V3 and V4 segments with segmental narrowing of the V4 segment above this level. Moderate proximal basilar artery stenosis is present. Basilar artery is small, terminating at the superior cerebellar arteries. Both posterior cerebral arteries are of fetal type. Moderate stenoses are present in the right posterior communicating artery with asymmetric attenuation of distal PCA branch vessels on the right. MRA NECK FINDINGS Time of flight imaging demonstrates common origin of the left common carotid artery and innominate artery. No significant proximal stenoses are present. MCA bifurcations are intact without significant flow disturbance. Flow is antegrade in the vertebral arteries bilaterally. The right vertebral artery is the dominant vessel. No flow is present in the distal left vertebral artery above the craniocervical junction. IMPRESSION: 1. Acute/subacute nonhemorrhagic infarcts involving the posterior right corona radiata and anterior aspect of the  left external capsule, corresponding with patient's symptoms. Right-sided infarcts likely impacts cortical spinal tracts involving left motor function. 2. Punctate cortical infarct along the inferior left frontal operculum. 3. Periventricular and subcortical T2 signal changes are otherwise mildly advanced for age. This likely reflects the sequela of chronic microvascular ischemia. 4. No flow signal in the distal left vertebral artery above the craniocervical junction. 5. Moderate to high-grade stenosis at the junction at the right V3 and V4 segments with segmental narrowing of the V4 segment above this level. 6. Moderate proximal basilar artery stenosis. 7. Atherosclerotic irregularity in the cavernous internal carotid arteries bilaterally with a 50-60% stenosis in the supraclinoid right ICA. 8. Moderate stenoses of the right posterior communicating artery with asymmetric attenuation of distal PCA branch vessels on the right. The right posterior cerebral artery is of fetal type. 9. No significant proximal stenosis in the neck. Electronically Signed   By: San Morelle M.D.   On: 04/16/2020 12:19   MR Brain Wo Contrast (neuro protocol)  Result Date: 04/16/2020 CLINICAL DATA:  New onset of left upper extremity weakness. Stroke symptoms have progressed significantly overnight. EXAM: MRI HEAD WITHOUT CONTRAST MRA HEAD WITHOUT CONTRAST MRA NECK WITHOUT CONTRAST TECHNIQUE: Multiplanar, multiecho pulse sequences of the brain and surrounding structures were obtained without intravenous contrast. Angiographic images of the Circle of Willis were  obtained using MRA technique without intravenous contrast. Angiographic images of the neck were obtained using MRA technique without intravenous contrast. Carotid stenosis measurements (when applicable) are obtained utilizing NASCET criteria, using the distal internal carotid diameter as the denominator. COMPARISON:  CT head without contrast 04/16/2020 FINDINGS: MRI HEAD  FINDINGS Brain: The diffusion-weighted images confirm acute/subacute nonhemorrhagic infarcts involving the posterior right corona radiata. White matter infarct in the anterior aspect of the left external capsule, just anterior to the lentiform nucleus measures up to 13 mm. Punctate cortical infarct is present along the inferior left frontal operculum. T2 signal changes are associated with each of these areas. More remote lacunar infarct is present at the left globus pallidus. The basal ganglia are otherwise intact Periventricular and subcortical T2 signal changes are otherwise mildly advanced for age. The internal auditory canals are within normal limits. The brainstem and cerebellum are within normal limits. Vascular: The distal left vertebral artery is not visualized. Flow is otherwise present in the major intracranial arteries. Skull and upper cervical spine: Craniocervical junction is normal. Flow is present in the vertebral arteries bilaterally. Visualized intracranial contents are normal. Sinuses/Orbits: Right greater than left mastoid effusion is present. The paranasal sinuses and mastoid air cells are otherwise clear. The globes and orbits are within normal limits. MRA HEAD FINDINGS Atherosclerotic irregularity is present in the cavernous internal carotid arteries bilaterally. 50-60% stenosis is present in the supraclinoid right ICA. No other significant stenoses are present through the ICA termini. The anterior communicating artery is patent. MCA bifurcations are intact. Asymmetric attenuation of distal left MCA branch vessels is noted without a significant proximal stenosis or occlusion. No flow signal is present in the left vertebral artery. Moderate to high-grade stenosis is present at the junction at the V3 and V4 segments with segmental narrowing of the V4 segment above this level. Moderate proximal basilar artery stenosis is present. Basilar artery is small, terminating at the superior cerebellar  arteries. Both posterior cerebral arteries are of fetal type. Moderate stenoses are present in the right posterior communicating artery with asymmetric attenuation of distal PCA branch vessels on the right. MRA NECK FINDINGS Time of flight imaging demonstrates common origin of the left common carotid artery and innominate artery. No significant proximal stenoses are present. MCA bifurcations are intact without significant flow disturbance. Flow is antegrade in the vertebral arteries bilaterally. The right vertebral artery is the dominant vessel. No flow is present in the distal left vertebral artery above the craniocervical junction. IMPRESSION: 1. Acute/subacute nonhemorrhagic infarcts involving the posterior right corona radiata and anterior aspect of the left external capsule, corresponding with patient's symptoms. Right-sided infarcts likely impacts cortical spinal tracts involving left motor function. 2. Punctate cortical infarct along the inferior left frontal operculum. 3. Periventricular and subcortical T2 signal changes are otherwise mildly advanced for age. This likely reflects the sequela of chronic microvascular ischemia. 4. No flow signal in the distal left vertebral artery above the craniocervical junction. 5. Moderate to high-grade stenosis at the junction at the right V3 and V4 segments with segmental narrowing of the V4 segment above this level. 6. Moderate proximal basilar artery stenosis. 7. Atherosclerotic irregularity in the cavernous internal carotid arteries bilaterally with a 50-60% stenosis in the supraclinoid right ICA. 8. Moderate stenoses of the right posterior communicating artery with asymmetric attenuation of distal PCA branch vessels on the right. The right posterior cerebral artery is of fetal type. 9. No significant proximal stenosis in the neck. Electronically Signed   By: Harrell Gave  Mattern M.D.   On: 04/16/2020 12:19   ECHOCARDIOGRAM COMPLETE BUBBLE STUDY  Result Date:  04/16/2020    ECHOCARDIOGRAM REPORT   Patient Name:   Tony Schmitt Date of Exam: 04/16/2020 Medical Rec #:  [id number]     Height:       64.0 in Accession #:    [id number]    Weight:       156.0 lb Date of Birth:  [birth year]     BSA:          1.760 m Patient Age:    67 years      BP:           199/106 mmHg Patient Gender: M             HR:           99 bpm. Exam Location:  Inpatient Procedure: 2D Echo Indications:    434.91 stroke  History:        Patient has prior history of Echocardiogram examinations, most                 recent 04/18/2019. Risk Factors:Hypertension, Diabetes and Former                 Smoker.  Sonographer:    Jannett Celestine RDCS (AE) Referring Phys: [place]  1. Left ventricular ejection fraction, by estimation, is 60 to 65%. The left ventricle has normal function. The left ventricle has no regional wall motion abnormalities. There is mild concentric left ventricular hypertrophy. Left ventricular diastolic function could not be evaluated.  2. Right ventricular systolic function is normal. The right ventricular size is normal. Tricuspid regurgitation signal is inadequate for assessing PA pressure.  3. The mitral valve is normal in structure. No evidence of mitral valve regurgitation. No evidence of mitral stenosis.  4. The aortic valve is normal in structure. Aortic valve regurgitation is not visualized. No aortic stenosis is present.  5. The inferior vena cava is normal in size with greater than 50% respiratory variability, suggesting right atrial pressure of 3 mmHg. FINDINGS  Left Ventricle: Left ventricular ejection fraction, by estimation, is 60 to 65%. The left ventricle has normal function. The left ventricle has no regional wall motion abnormalities. The left ventricular internal cavity size was normal in size. There is  mild concentric left ventricular hypertrophy. Left ventricular diastolic function could not be evaluated. Right Ventricle: The right ventricular  size is normal. No increase in right ventricular wall thickness. Right ventricular systolic function is normal. Tricuspid regurgitation signal is inadequate for assessing PA pressure. Left Atrium: Left atrial size was normal in size. Right Atrium: Right atrial size was normal in size. Pericardium: There is no evidence of pericardial effusion. Mitral Valve: The mitral valve is normal in structure. No evidence of mitral valve regurgitation. No evidence of mitral valve stenosis. Tricuspid Valve: The tricuspid valve is normal in structure. Tricuspid valve regurgitation is not demonstrated. No evidence of tricuspid stenosis. Aortic Valve: The aortic valve is normal in structure. Aortic valve regurgitation is not visualized. No aortic stenosis is present. Pulmonic Valve: The pulmonic valve was normal in structure. Pulmonic valve regurgitation is not visualized. No evidence of pulmonic stenosis. Aorta: The aortic root is normal in size and structure. Venous: The inferior vena cava is normal in size with greater than 50% respiratory variability, suggesting right atrial pressure of 3 mmHg. IAS/Shunts: No atrial level shunt detected by color flow Doppler.  LEFT VENTRICLE PLAX 2D  LVIDd:         4.30 cm LVIDs:         2.40 cm LV PW:         1.20 cm LV IVS:        1.20 cm LVOT diam:     2.00 cm LV SV:         49 LV SV Index:   28 LVOT Area:     3.14 cm  RIGHT VENTRICLE RV S prime:     13.90 cm/s TAPSE (M-mode): 2.1 cm LEFT ATRIUM             Index LA diam:        2.90 cm 1.65 cm/m LA Vol (A2C):   26.8 ml 15.22 ml/m LA Vol (A4C):   49.3 ml 28.01 ml/m LA Biplane Vol: 36.5 ml 20.73 ml/m  AORTIC VALVE LVOT Vmax:   89.50 cm/s LVOT Vmean:  65.200 cm/s LVOT VTI:    0.157 m  AORTA Ao Root diam: 3.20 cm  SHUNTS Systemic VTI:  0.16 m Systemic Diam: 2.00 cm [name] MD Electronically signed by [name] MD Signature Date/Time: 04/16/2020/9:31:26 AM    Final         Scheduled Meds: .  stroke: mapping our early stages of  recovery book   Does not apply Once  . aspirin  81 mg Oral Daily  . atorvastatin  80 mg Oral QHS  . enoxaparin (LOVENOX) injection  40 mg Subcutaneous Q24H  . insulin aspart  0-9 Units Subcutaneous TID WC  . insulin glargine  10 Units Subcutaneous Daily  . pantoprazole  40 mg Oral Daily   Continuous Infusions:   LOS: 1 day    Time spent: 35 minutes    Layah Skousen A Charissa Knowles, MD [facility]   If 7PM-7AM, please contact night-coverage [email or web address]  04/17/2020, 6:59 AM

## 2020-04-17 NOTE — Progress Notes (Signed)
    [facility] has been requested to perform a transesophageal echocardiogram on Tony Schmitt for stroke.  After careful review of history and examination, the risks and benefits of transesophageal echocardiogram have been explained including risks of esophageal damage, perforation (1:10,000 risk), bleeding, pharyngeal hematoma as well as other potential complications associated with conscious sedation including aspiration, arrhythmia, respiratory failure and death. Alternatives to treatment were discussed, questions were answered. Patient is willing to proceed.   Tami Lin Tierre Gerard, PA  04/17/2020 10:00 AM

## 2020-04-17 NOTE — Progress Notes (Signed)
STROKE TEAM PROGRESS NOTE   HISTORY OF PRESENT ILLNESS (per record) Tony Schmitt is an 49 y.o. male  With PMH HTN, DM, HLD who presented to [facility] ED with one to 2 day history of numbness  And acute left side weakness. Initially said weakness started on Thursday but then says that he was having some symptoms Wednesday in the afternoon. Per patient he started having numbness on Thursday 04/14/20. He didn't pay it much attention. At that time he says he did not notice any weakness. Her went ot bed woke up Friday and still had the same numbness. By Friday afternoon he had weakness on the left side. He noticed he was walking funny to the bathroom. Says he could not take his pants off, and he sat down on the ground in the bathroom and called for his mother. At this time he came to the hospital.  Denies any HA, dizziness, vision changes, ETOH use, smoking, drug use. Endorses missing medication doses. Does no take daily ASA.  No prior stroke history.  ED course:  CTH: no hemorrhage; old lacunar infarct left BG, periventricular white matter change suggestive of acute infarct.  Creatinine: 2.70 MRI brain: pending MRA had/ neck: pending Carotid US: pending ECHO: EF 60-65%, no atrial level shunting BG: 159 BP: 186/96  Date last known well: 04/14/20 Time last known well: afternoon tPA Given: No: outside of window  Modified Rankin: Rankin Score=1   INTERVAL HISTORY I have personally reviewed history of presenting illness, electronic medical records and imaging films in PACS.  Patient lying comfortably in bed.  He still has dense left hemiplegia without any improvement.  Echocardiogram is unremarkable.  Urine drug screen is negative.  LDL cholesterol significantly elevated at 5 9 9  mg percent and hemoglobin A1c at 9.6.  He admits that he has been noncompliant with his medications for diabetes, hypertension and cholesterol.  He denies any history of DVT, pulmonary embolism.    OBJECTIVE Vitals:   04/16/20 2100  04/16/20 2145 04/16/20 2344 04/17/20 0404  BP: (!) 176/109 (!) 172/97 (!) 177/99 (!) 184/100  Pulse: (!) 112 (!) 104 (!) 103 100  Resp: 18 18 19 19   Temp: 98.2 F (36.8 C) 98.4 F (36.9 C) 98.6 F (37 C) 98.8 F (37.1 C)  TempSrc:  Oral Oral Oral  SpO2: 99% 100% 100% 98%  Weight:  73.2 kg    Height:  5\' 4"  (1.626 m)      CBC:  Recent Labs  Lab 04/16/20 0045 04/16/20 0558  WBC 12.0* 12.1*  NEUTROABS 9.5*  --   HGB 10.7* 10.9*  HCT 33.3* 34.5*  MCV 85.4 85.8  PLT 455* 459*    Basic Metabolic Panel:  Recent Labs  Lab 04/16/20 0045 04/16/20 0558  NA 139 142  K 3.8 3.6  CL 106 108  CO2 24 24  GLUCOSE 227* 159*  BUN 31* 31*  CREATININE 2.92* 2.70*  CALCIUM 9.1 9.1    Lipid Panel:     Component Value Date/Time   CHOL 699 (H) 04/16/2020 0558   CHOL 382 (H) 08/19/2019 1526   TRIG 298 (H) 04/16/2020 0558   HDL 40 (L) 04/16/2020 0558   HDL 116 08/19/2019 1526   CHOLHDL 17.5 04/16/2020 0558   VLDL 60 (H) 04/16/2020 0558   LDLCALC 599 (H) 04/16/2020 0558   LDLCALC 209 (H) 08/19/2019 1526   HgbA1c:  Lab Results  Component Value Date   HGBA1C 9.6 (H) 04/16/2020   Urine Drug Screen:  Component Value Date/Time   LABOPIA NONE DETECTED 04/16/2020 0800   COCAINSCRNUR NONE DETECTED 04/16/2020 0800   LABBENZ NONE DETECTED 04/16/2020 0800   AMPHETMU NONE DETECTED 04/16/2020 0800   THCU NONE DETECTED 04/16/2020 0800   LABBARB NONE DETECTED 04/16/2020 0800    Alcohol Level     Component Value Date/Time   ETH <10 04/16/2020 0021    IMAGING  CT HEAD WO CONTRAST 04/16/2020 IMPRESSION:  1. Low-attenuation change in the right periventricular white matter suggesting acute infarct or possibly a mass lesion. Consider MRI for further evaluation.  2. Old lacunar infarcts in the left basal ganglia.  3. No acute intracranial hemorrhage.  4. Hypoaeration of the mastoid air cells with small mastoid effusions. Mucosal thickening in the right middle ear possibly  representing inflammatory process or cholesteatoma.   MR Brain Wo Contrast (neuro protocol) MR ANGIO HEAD WO CONTRAST MR ANGIO NECK WO CONTRAST 04/16/2020 IMPRESSION:  1. Acute/subacute nonhemorrhagic infarcts involving the posterior right corona radiata and anterior aspect of the left external capsule, corresponding with patient's symptoms. Right-sided infarcts likely impacts cortical spinal tracts involving left motor function.  2. Punctate cortical infarct along the inferior left frontal operculum.  3. Periventricular and subcortical T2 signal changes are otherwise mildly advanced for age. This likely reflects the sequela of chronic microvascular ischemia.  4. No flow signal in the distal left vertebral artery above the craniocervical junction.  5. Moderate to high-grade stenosis at the junction at the right V3 and V4 segments with segmental narrowing of the V4 segment above this level.  6. Moderate proximal basilar artery stenosis.  7. Atherosclerotic irregularity in the cavernous internal carotid arteries bilaterally with a 50-60% stenosis in the supraclinoid right ICA.  8. Moderate stenoses of the right posterior communicating artery with asymmetric attenuation of distal PCA branch vessels on the right. The right posterior cerebral artery is of fetal type.  9. No significant proximal stenosis in the neck.   ECHOCARDIOGRAM COMPLETE BUBBLE STUDY 04/16/2020 IMPRESSIONS   1. Left ventricular ejection fraction, by estimation, is 60 to 65%. The left ventricle has normal function. The left ventricle has no regional wall motion abnormalities. There is mild concentric left ventricular hypertrophy. Left ventricular diastolic function could not be evaluated.   2. Right ventricular systolic function is normal. The right ventricular size is normal. Tricuspid regurgitation signal is inadequate for assessing PA pressure.   3. The mitral valve is normal in structure. No evidence of mitral valve  regurgitation. No evidence of mitral stenosis.   4. The aortic valve is normal in structure. Aortic valve regurgitation is not visualized. No aortic stenosis is present.   5. The inferior vena cava is normal in size with greater than 50% respiratory variability, suggesting right atrial pressure of 3 mmHg.   Bilateral Lower Extremity Venous Dopplers - pending  Bilateral Carotid Dopplers - pending  ECG - SR rate 93 BPM. (See cardiology reading for complete details)  PHYSICAL EXAM Blood pressure (!) 184/100, pulse 100, temperature 98.8 F (37.1 C), temperature source Oral, resp. rate 19, height 5\' 4"  (1.626 m), weight 73.2 kg, SpO2 98 %. Pleasant middle-age African-American male not in distress. . Afebrile. Head is nontraumatic. Neck is supple without bruit.    Cardiac exam no murmur or gallop. Lungs are clear to auscultation. Distal pulses are well felt. Neurological Exam ;  Awake  Alert oriented x 3. Normal speech and language.eye movements full without nystagmus.fundi were not visualized. Vision acuity and fields appear normal. Hearing is  normal. Palatal movements are normal. Face asymmetric with right lower facial weakness.. Tongue midline. Normal strength, tone, reflexes and coordination.  On the right side.  Dense left hemiplegia with 0/5 strength with flaccidity and hypotonia on the left.  Normal sensation. Gait deferred.     ASSESSMENT/PLAN Tony Schmitt is a 49 y.o. male with history of HTN, DM, HLD  presenting with a two day history of left sided weakness. He did not receive IV t-PA due to late presentation (>4.5 hours from time of onset).  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  Resultant left hemiplegia  Code Stroke CT Head - not ordered    CT head - Low-attenuation change in the right periventricular white matter suggesting acute infarct or possibly a mass lesion. Consider MRI for further evaluation. Old lacunar infarcts in the left basal ganglia.    MRI head - Acute/subacute nonhemorrhagic infarcts involving the posterior right corona radiata and anterior aspect of the left external capsule. Right-sided infarcts likely impacts cortical spinal tracts involving left motor function. Punctate cortical infarct along the inferior left frontal operculum.   MRA head - Moderate to high-grade stenosis at the junction at the right V3 and V4 segments with segmental narrowing of the V4 segment above this level. Moderate proximal basilar artery stenosis. 50-60% stenosis in the supraclinoid right ICA. Moderate stenoses of the right posterior communicating artery with asymmetric attenuation of distal PCA branch vessels on the right.   MRA Neck - No significant proximal stenosis in the neck.   CTA H&N - not ordered   CT Perfusion - not ordered  Carotid Doppler - pending  Bilateral Lower Extremity Venous Dopplers - pending  2D Echo - EF 60 - 65%. No cardiac source of emboli identified.   Sars Corona Virus 2 - negative  LDL - 599  HgbA1c - 9.6  UDS - negative  VTE prophylaxis - Lovenox Diet  Diet Order            Diet heart healthy/carb modified Room service appropriate? Yes; Fluid consistency: Thin  Diet effective now                  No antithrombotic prior to admission, now on aspirin 81 mg daily  Patient counseled to be compliant with his antithrombotic medications  Ongoing aggressive stroke risk factor management  Therapy recommendations:  CIR recommended  Disposition:  Pending  Hypertension  Home BP meds: Norvasc ; Zestril  Current BP meds: apresoline prn  Blood pressure somewhat high at times but within post stroke/TIA parameters . Permissive hypertension (OK if < 220/120) but gradually normalize in 5-7 days  . Long-term BP goal normotensive  Hyperlipidemia  Home Lipid lowering medication: Crestor 20 mg daily  LDL 599, goal < 70  Current lipid lowering medication: Lipitor 80 mg daily  Continue statin at  discharge  Diabetes  Home diabetic meds: glucotrol ; metformin  Current diabetic meds: insulin  HgbA1c 9.6, goal < 7.0 Recent Labs    04/16/20 1746 04/16/20 2248 04/17/20 0617  GLUCAP 158* 179* 140*     Other Stroke Risk Factors  Former cigarette smoker - quit  Previous ETOH use  Overweight, Body mass index is 27.7 kg/m., recommend weight loss, diet and exercise as appropriate   Other Active Problems  Code status - Full code Anemia - Hgb - 10.9 CKD - stage 4 - creatinine - 2.70 Mild leukocytosis - WBC's - 12.1  (afebrile) Low grade tachycardia  Hospital day #  1 Who presented with by cerebral large subcortical infarcts likely of embolic etiology given the increased size of the infarcts.  Recommend further evaluation by checking cardiac monitoring, TEE if for cardiac source of embolism and loop recorder at discharge for paroxysmal A. fib.  Check anticardiolipin antibodies and ANA.  TCD bubble study for PFO.  Discussed with patient and Dr. Tyrell Antonio  .  Greater than 50% time during this 35-minute visit were spent in counseling and coordination of care about his cryptogenic strokes and discussion about evaluation treatment plan and answering questions. Antony Contras, MD To contact Stroke Continuity provider, please refer to [email or web address] After hours, contact General Neurology

## 2020-04-18 ENCOUNTER — Inpatient Hospital Stay (HOSPITAL_COMMUNITY): Payer: Self-pay

## 2020-04-18 ENCOUNTER — Encounter (HOSPITAL_COMMUNITY)
Admission: EM | Disposition: A | Discharge status: Rehabilitation Facility | Payer: Self-pay | Source: Home / Self Care | Attending: Internal Medicine

## 2020-04-18 ENCOUNTER — Encounter (HOSPITAL_COMMUNITY): Payer: Self-pay | Admitting: Internal Medicine

## 2020-04-18 ENCOUNTER — Inpatient Hospital Stay (HOSPITAL_COMMUNITY): Payer: Self-pay | Admitting: Anesthesiology

## 2020-04-18 DIAGNOSIS — I639 Cerebral infarction, unspecified: Secondary | ICD-10-CM

## 2020-04-18 DIAGNOSIS — I10 Essential (primary) hypertension: Secondary | ICD-10-CM

## 2020-04-18 DIAGNOSIS — M7989 Other specified soft tissue disorders: Secondary | ICD-10-CM

## 2020-04-18 HISTORY — PX: BUBBLE STUDY: SHX6837

## 2020-04-18 HISTORY — PX: TEE WITHOUT CARDIOVERSION: SHX5443

## 2020-04-18 LAB — CBC
HCT: 29.5 % — ABNORMAL LOW (ref 39.0–52.0)
Hemoglobin: 9.4 g/dL — ABNORMAL LOW (ref 13.0–17.0)
MCH: 27.1 pg (ref 26.0–34.0)
MCHC: 31.9 g/dL (ref 30.0–36.0)
MCV: 85 fL (ref 80.0–100.0)
Platelets: 426 10*3/uL — ABNORMAL HIGH (ref 150–400)
RBC: 3.47 MIL/uL — ABNORMAL LOW (ref 4.22–5.81)
RDW: 13.9 % (ref 11.5–15.5)
WBC: 10.8 10*3/uL — ABNORMAL HIGH (ref 4.0–10.5)
nRBC: 0 % (ref 0.0–0.2)

## 2020-04-18 LAB — BASIC METABOLIC PANEL
Anion gap: 8 (ref 5–15)
BUN: 31 mg/dL — ABNORMAL HIGH (ref 6–20)
CO2: 20 mmol/L — ABNORMAL LOW (ref 22–32)
Calcium: 8.9 mg/dL (ref 8.9–10.3)
Chloride: 112 mmol/L — ABNORMAL HIGH (ref 98–111)
Creatinine, Ser: 3.06 mg/dL — ABNORMAL HIGH (ref 0.61–1.24)
GFR calc Af Amer: 26 mL/min — ABNORMAL LOW (ref >60)
GFR calc non Af Amer: 23 mL/min — ABNORMAL LOW (ref >60)
Glucose, Bld: 93 mg/dL (ref 70–99)
Potassium: 3.2 mmol/L — ABNORMAL LOW (ref 3.5–5.1)
Sodium: 140 mmol/L (ref 135–145)

## 2020-04-18 LAB — GLUCOSE, CAPILLARY
Glucose-Capillary: 117 mg/dL — ABNORMAL HIGH (ref 70–99)
Glucose-Capillary: 82 mg/dL (ref 70–99)
Glucose-Capillary: 75 mg/dL (ref 70–99)
Glucose-Capillary: 226 mg/dL — ABNORMAL HIGH (ref 70–99)

## 2020-04-18 LAB — URINE CULTURE

## 2020-04-18 LAB — SICKLE CELL SCREEN: Sickle Cell Screen: NEGATIVE

## 2020-04-18 SURGERY — ECHOCARDIOGRAM, TRANSESOPHAGEAL
Anesthesia: Monitor Anesthesia Care

## 2020-04-18 MED ORDER — AMLODIPINE BESYLATE 5 MG PO TABS
5.0000 mg | ORAL_TABLET | Freq: Every day | ORAL | Status: DC
  Administered 2020-04-18 – 2020-04-19 (×2): 5 mg via ORAL
  Filled 2020-04-18 (×2): qty 1

## 2020-04-18 MED ORDER — LIDOCAINE 2% (20 MG/ML) 5 ML SYRINGE
INTRAMUSCULAR | Status: DC | PRN
  Administered 2020-04-18: 40 mg via INTRAVENOUS

## 2020-04-18 MED ORDER — POTASSIUM CHLORIDE CRYS ER 20 MEQ PO TBCR
20.0000 meq | EXTENDED_RELEASE_TABLET | Freq: Once | ORAL | Status: AC
  Administered 2020-04-18: 20 meq via ORAL
  Filled 2020-04-18: qty 1

## 2020-04-18 MED ORDER — PROPOFOL 500 MG/50ML IV EMUL
INTRAVENOUS | Status: DC | PRN
  Administered 2020-04-18: 200 ug/kg/min via INTRAVENOUS

## 2020-04-18 NOTE — TOC Initial Note (Signed)
Transition of Care [employer] [facility]) - Initial/Assessment Note    Patient Details  Name: Tony Schmitt MRN: [id number] Date of Birth: Dec 21, 1970  [facility] CM/SW Contact:    Pollie Friar, RN Phone Number: 04/18/2020, 11:21 AM  Clinical Narrative:                 CM met with the patient and his mother at the bedside. Pts mother asking about insurance (medicaid). CM has sent email to the financial counselor assigned to see if they can meet with the patient and his mother.  Mother is interested in one of the [place] after d/c. CM will follow and obtain an appt closer to d/c.  Recommendations are for CIR. CM following.   Expected Discharge Plan: IP Rehab Facility Barriers to Discharge: Inadequate or no insurance, Continued Medical Work up   Patient Goals and CMS Choice   CMS Medicare.gov Compare Post Acute Care list provided to:: Patient Choice offered to / list presented to : Patient, Parent  Expected Discharge Plan and Services Expected Discharge Plan: [place]   Discharge Planning Services: CM Consult Post Acute Care Choice: IP Rehab Living arrangements for the past 2 months: Single Family Home                                      Prior Living Arrangements/Services Living arrangements for the past 2 months: Single Family Home Lives with:: Parents Patient language and need for interpreter reviewed:: Yes Do you feel safe going back to the place where you live?: Yes      Need for Family Participation in Patient Care: Yes (Comment) Care giver support system in place?: Yes (comment)   Criminal Activity/Legal Involvement Pertinent to Current Situation/Hospitalization: No - Comment as needed  Activities of Daily Living      Permission Sought/Granted                  Emotional Assessment Appearance:: Appears stated age Attitude/Demeanor/Rapport:  (Quiet) Affect (typically observed): Accepting     Psych Involvement: No  (comment)  Admission diagnosis:  Acute left-sided weakness [R53.1] Acute CVA (cerebrovascular accident) ([place]) [I63.9] Cerebrovascular accident (CVA), unspecified mechanism ([place]) [I63.9] Patient Active Problem List   Diagnosis Date Noted  . Acute CVA (cerebrovascular accident) ([place]) 04/16/2020  . Hypertensive urgency 04/16/2020  . Type 2 diabetes mellitus with vascular disease ([place]) 04/16/2020  . CKD (chronic kidney disease) stage 3, GFR 30-59 ml/min 04/23/2019  . Hyperlipidemia 04/18/2019  . Dyslipidemia associated with type 2 diabetes mellitus ([place]) 08/29/2018  . Hypertension associated with type 2 diabetes mellitus ([place]) 08/29/2018   PCP:  No primary care provider on file. Pharmacy:   [facility] [place], [place] - [address] [place] [address] Phone: [phone] Fax: [phone]  [place] ([address] [place] [address] [place]  [address] Phone: [phone] Fax: [phone]     Social Determinants of Health (SDOH) Interventions    Readmission Risk Interventions No flowsheet data found.

## 2020-04-18 NOTE — Progress Notes (Signed)
STROKE TEAM PROGRESS NOTE      INTERVAL HISTORY His mother is sitting at the bedside.  Patient is lying comfortably in bed.  Is neurologically unchanged.  No new complaints.  Vital signs stable.  TCD bubble study was done at the bedside by me and was negative for PFO.  He had TEE done earlier today by Dr. Radford Pax which showed no evidence of cardiac source of embolism or clot.  Few delayed bubbles were noted.  OBJECTIVE Vitals:   04/18/20 1308 04/18/20 1310 04/18/20 1315 04/18/20 1320  BP: 124/69   (!) 153/81  Pulse: 84 85 88 88  Resp: (!) 25 (!) 22 (!) 21 16  Temp: 98 F (36.7 C)     TempSrc:      SpO2: 100% 100% 100% 100%  Weight:      Height:        CBC:  Recent Labs  Lab 04/16/20 0045 04/16/20 0558 04/17/20 0824 04/18/20 0910  WBC 12.0*   < > 10.8* 10.8*  NEUTROABS 9.5*  --   --   --   HGB 10.7*   < > 10.6* 9.4*  HCT 33.3*   < > 33.1* 29.5*  MCV 85.4   < > 84.7 85.0  PLT 455*   < > 434* 426*   < > = values in this interval not displayed.    Basic Metabolic Panel:  Recent Labs  Lab 04/17/20 0824 04/17/20 0824 04/17/20 1450 04/18/20 0910  NA 136   < > 137 140  K 5.7*   < > 3.3* 3.2*  CL 110   < > 108 112*  CO2 16*   < > 22 20*  GLUCOSE 107*   < > 132* 93  BUN 30*   < > 29* 31*  CREATININE 3.08*   < > 3.01* 3.06*  CALCIUM 9.1   < > 9.0 8.9  MG 1.8  --   --   --    < > = values in this interval not displayed.    Lipid Panel:     Component Value Date/Time   CHOL 699 (H) 04/16/2020 0558   CHOL 382 (H) 08/19/2019 1526   TRIG 298 (H) 04/16/2020 0558   HDL 40 (L) 04/16/2020 0558   HDL 116 08/19/2019 1526   CHOLHDL 17.5 04/16/2020 0558   VLDL 60 (H) 04/16/2020 0558   LDLCALC 599 (H) 04/16/2020 0558   LDLCALC 209 (H) 08/19/2019 1526   HgbA1c:  Lab Results  Component Value Date   HGBA1C 9.6 (H) 04/16/2020   Urine Drug Screen:     Component Value Date/Time   LABOPIA NONE DETECTED 04/16/2020 0800   COCAINSCRNUR NONE DETECTED 04/16/2020 0800   LABBENZ  NONE DETECTED 04/16/2020 0800   AMPHETMU NONE DETECTED 04/16/2020 0800   THCU NONE DETECTED 04/16/2020 0800   LABBARB NONE DETECTED 04/16/2020 0800    Alcohol Level     Component Value Date/Time   ETH <10 04/16/2020 0021    IMAGING  CT HEAD WO CONTRAST 04/16/2020 IMPRESSION:  1. Low-attenuation change in the right periventricular white matter suggesting acute infarct or possibly a mass lesion. Consider MRI for further evaluation.  2. Old lacunar infarcts in the left basal ganglia.  3. No acute intracranial hemorrhage.  4. Hypoaeration of the mastoid air cells with small mastoid effusions. Mucosal thickening in the right middle ear possibly representing inflammatory process or cholesteatoma.   MR Brain Wo Contrast (neuro protocol) MR ANGIO HEAD WO CONTRAST MR ANGIO NECK  WO CONTRAST 04/16/2020 IMPRESSION:  1. Acute/subacute nonhemorrhagic infarcts involving the posterior right corona radiata and anterior aspect of the left external capsule, corresponding with patient's symptoms. Right-sided infarcts likely impacts cortical spinal tracts involving left motor function.  2. Punctate cortical infarct along the inferior left frontal operculum.  3. Periventricular and subcortical T2 signal changes are otherwise mildly advanced for age. This likely reflects the sequela of chronic microvascular ischemia.  4. No flow signal in the distal left vertebral artery above the craniocervical junction.  5. Moderate to high-grade stenosis at the junction at the right V3 and V4 segments with segmental narrowing of the V4 segment above this level.  6. Moderate proximal basilar artery stenosis.  7. Atherosclerotic irregularity in the cavernous internal carotid arteries bilaterally with a 50-60% stenosis in the supraclinoid right ICA.  8. Moderate stenoses of the right posterior communicating artery with asymmetric attenuation of distal PCA branch vessels on the right. The right posterior cerebral artery is of  fetal type.  9. No significant proximal stenosis in the neck.   ECHOCARDIOGRAM COMPLETE BUBBLE STUDY 04/16/2020 IMPRESSIONS   1. Left ventricular ejection fraction, by estimation, is 60 to 65%. The left ventricle has normal function. The left ventricle has no regional wall motion abnormalities. There is mild concentric left ventricular hypertrophy. Left ventricular diastolic function could not be evaluated.   2. Right ventricular systolic function is normal. The right ventricular size is normal. Tricuspid regurgitation signal is inadequate for assessing PA pressure.   3. The mitral valve is normal in structure. No evidence of mitral valve regurgitation. No evidence of mitral stenosis.   4. The aortic valve is normal in structure. Aortic valve regurgitation is not visualized. No aortic stenosis is present.   5. The inferior vena cava is normal in size with greater than 50% respiratory variability, suggesting right atrial pressure of 3 mmHg.   Bilateral Lower Extremity Venous Dopplers - pending  Bilateral Carotid Dopplers - pending  ECG - SR rate 93 BPM. (See cardiology reading for complete details)  PHYSICAL EXAM Blood pressure (!) 153/81, pulse 88, temperature 98 F (36.7 C), resp. rate 16, height 5\' 4"  (1.626 m), weight 73.2 kg, SpO2 100 %. Pleasant middle-age African-American male not in distress. . Afebrile. Head is nontraumatic. Neck is supple without bruit.    Cardiac exam no murmur or gallop. Lungs are clear to auscultation. Distal pulses are well felt. Neurological Exam ;  Awake  Alert oriented x 3. Normal speech and language.eye movements full without nystagmus.fundi were not visualized. Vision acuity and fields appear normal. Hearing is normal. Palatal movements are normal. Face asymmetric with right lower facial weakness.. Tongue midline. Normal strength, tone, reflexes and coordination.  On the right side.  Dense left hemiplegia with 0/5 strength with flaccidity and hypotonia on the  left.  Normal sensation. Gait deferred.     ASSESSMENT/PLAN [name] is a 49 y.o. male with history of HTN, DM, HLD  presenting with a two day history of left sided weakness. He did not receive IV t-PA due to late presentation (>4.5 hours from time of onset).  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  Resultant left hemiplegia  Code Stroke CT Head - not ordered    CT head - Low-attenuation change in the right periventricular white matter suggesting acute infarct or possibly a mass lesion. Consider MRI for further evaluation. Old lacunar infarcts in the left basal ganglia.   MRI head - Acute/subacute nonhemorrhagic infarcts involving  the posterior right corona radiata and anterior aspect of the left external capsule. Right-sided infarcts likely impacts cortical spinal tracts involving left motor function. Punctate cortical infarct along the inferior left frontal operculum.   MRA head - Moderate to high-grade stenosis at the junction at the right V3 and V4 segments with segmental narrowing of the V4 segment above this level. Moderate proximal basilar artery stenosis. 50-60% stenosis in the supraclinoid right ICA. Moderate stenoses of the right posterior communicating artery with asymmetric attenuation of distal PCA branch vessels on the right.   MRA Neck - No significant proximal stenosis in the neck.   CTA H&N - not ordered   CT Perfusion - not ordered  Carotid Doppler - pending  Bilateral Lower Extremity Venous Dopplers - pending  2D Echo - EF 60 - 65%. No cardiac source of emboli identified.   TEE normal  TCD BUbble study negative  [employer] Virus 2 - negative  LDL - 599  HgbA1c - 9.6  UDS - negative  VTE prophylaxis - Lovenox Diet  Diet Order    None      No antithrombotic prior to admission, now on aspirin 81 mg daily  Patient counseled to be compliant with his antithrombotic medications  Ongoing aggressive stroke risk factor  management  Therapy recommendations:  CIR recommended  Disposition:  Pending  Hypertension  Home BP meds: Norvasc ; Zestril  Current BP meds: apresoline prn  Blood pressure somewhat high at times but within post stroke/TIA parameters . Permissive hypertension (OK if < 220/120) but gradually normalize in 5-7 days  . Long-term BP goal normotensive  Hyperlipidemia  Home Lipid lowering medication: Crestor 20 mg daily  LDL 599, goal < 70  Current lipid lowering medication: Lipitor 80 mg daily  Continue statin at discharge  Diabetes  Home diabetic meds: glucotrol ; metformin  Current diabetic meds: insulin  HgbA1c 9.6, goal < 7.0 Recent Labs    04/17/20 2106 04/18/20 0610 04/18/20 1114  GLUCAP 136* 117* 82    Other Stroke Risk Factors  Former cigarette smoker - quit  Previous ETOH use  Overweight, Body mass index is 27.7 kg/m., recommend weight loss, diet and exercise as appropriate   Other Active Problems  Code status - Full code Anemia - Hgb - 10.9 CKD - stage 4 - creatinine - 2.70 Mild leukocytosis - WBC's - 12.1  (afebrile) Low grade tachycardia  Hospital day # 2 He presented with bicerebral large subcortical infarcts likely of embolic etiology given the increased size of the infarcts.  Recommend   loop recorder at discharge for paroxysmal A. fib.  Follow results of anticardiolipin antibodies and ANA.    Discussed with patient and Dr. Tyrell Antonio and patient's mother and answered questions..  Greater than 50% time during this 25-minute visit were spent in counseling and coordination of care about his cryptogenic strokes and discussion about evaluation treatment plan and answering questions.  Transfer to rehab when bed available.  Stroke team will sign off.  Kindly call for questions.  Follow-up as an outpatient stroke clinic in 6 weeks Antony Contras, MD To contact Stroke Continuity provider, please refer to [email or web address] After hours, contact General Neurology

## 2020-04-18 NOTE — Progress Notes (Addendum)
PROGRESS NOTE    Tony Schmitt  GQB:[id number] DOB: 1971-05-03 DOA: 04/15/2020 PCP: No primary care provider on file.   Brief Narrative: 49 year old PMH hypertension, diabetes, CKD stage IIIa who presents to the ED with left-sided weakness. Patient weakness a started on September 04/2020 evening but it was mild. He wake up on 9/10 with worsening weakness. He was not able to stand up from the floor and call his mom. Evaluation in the ED CT head showed low-attenuation changes in the right periventricular white matter concerning for acute infarct versus mass lesion. Systolic blood pressure markedly elevated more than 200.  Assessment & Plan:   Principal Problem:   Acute CVA (cerebrovascular accident) ([place]) Active Problems:   Dyslipidemia associated with type 2 diabetes mellitus (HCC)   CKD (chronic kidney disease) stage 3, GFR 30-59 ml/min   Hypertensive urgency   Type 2 diabetes mellitus with vascular disease ([place])   1-Acute CVA, involve posterior right corona and anterior aspect of left external capsule. -MRI; acute subacute nonhemorrhagic infarct involving posterior right corona radiata  and anterior aspect of the left external capsule. Right-sided infarct likely impact corticospinal tracts involving left  motor function. Punctate cortical infarct along the inferior left frontal operculum.  -MRA; moderate to high-grade stenosis at the junction of the right V3 and V4 segment with segmental narrowing of the V4 segment above this level. Moderate proximal basilar artery stenosis. Atherosclerotic irregularity in the cavernous internal carotid arteries bilaterally with 50 to 60% stenosis of the supraclinoid right ICA. Moderate stenosis of the right posterior communicating artery with asymmetric attenuation of distal PCA branch vessel on the right. -on Lipitor, aspirin. Further anticoagulation recommendation per Neurology./  -Neurology recommend TEE, plan for TEE today.  -Hopefully inpatient rehab  admission.  -will start low dose BP medications./   2-CKD stage IIIa; Prior cr 1.8 Presents with Cr :3.5--2.9 Continue with  IV fluids, cr increase to 3 Metabolic acidosis, started  sodium bicarb tablet.  Metabolic acidosis improved.  Continue to monitor urine out put.   3-Hyperlipidemia;  LDL; 584. Started on lipitor.   4-Diabetes Type 2;  Hb-A1c; 9.7 On low dose lantus.  Needs to continue to hold metformin due to CKD  5-Hyperkalemia; Hemolysis on B-met. Lab error.    6-Leukocytosis; trending down.  Repeat Urine culture.  WBC trending down.   Hypokalemia; replete orally.  HTN; permissive HTN initially, due to acute stroke.  Plan to resume low dose norvasc.   Left Arm swelling; check [place]  Estimated body mass index is 27.7 kg/m as calculated from the following:   Height as of this encounter: '5\' 4"'  (1.626 m).   Weight as of this encounter: 73.2 kg.   DVT prophylaxis: Lovenox Code Status: Full code Family Communication: Care discussed with patient, Mother at bedside.  Disposition Plan:  Status is: Inpatient  Remains inpatient appropriate because:Ongoing diagnostic testing needed not appropriate for outpatient work up   Dispo: The patient is from: Home              Anticipated d/c is to: to be determine              Anticipated d/c date is: 2 days              Patient currently is not medically stable to d/c.TEE today, start inpatient rehab authorization process.         Consultants:   Neurology   Procedures:   ECHO; normal EF, no evidence of thrombus  TEE  Antimicrobials:    Subjective: Left side weakness stable.    Objective: Vitals:   04/18/20 1308 04/18/20 1310 04/18/20 1315 04/18/20 1320  BP: 124/69   (!) 153/81  Pulse: 84 85 88 88  Resp: (!) 25 (!) 22 (!) 21 16  Temp: 98 F (36.7 C)     TempSrc:      SpO2: 100% 100% 100% 100%  Weight:      Height:        Intake/Output Summary (Last 24 hours) at 04/18/2020 1333 Last data filed  at 04/18/2020 1255 Gross per 24 hour  Intake 900 ml  Output 300 ml  Net 600 ml   Filed Weights   04/16/20 2145  Weight: 73.2 kg    Examination:  General exam: NAD Respiratory system: CTA Cardiovascular system: S 1, S 2 RRR Gastrointestinal system: BS present, soft, nt Central nervous system: alert, left side weakness.  Extremities: left side upper 2/5 LE 2 -3/5   Data Reviewed: I have personally reviewed following labs and imaging studies  CBC: Recent Labs  Lab 04/16/20 0044 04/16/20 0045 04/16/20 0558 04/17/20 0824 04/18/20 0910  WBC  --  12.0* 12.1* 10.8* 10.8*  NEUTROABS  --  9.5*  --   --   --   HGB 10.5* 10.7* 10.9* 10.6* 9.4*  HCT 31.0* 33.3* 34.5* 33.1* 29.5*  MCV  --  85.4 85.8 84.7 85.0  PLT  --  455* 459* 434* 657*   Basic Metabolic Panel: Recent Labs  Lab 04/16/20 0045 04/16/20 0558 04/17/20 0824 04/17/20 1450 04/18/20 0910  NA 139 142 136 137 140  K 3.8 3.6 5.7* 3.3* 3.2*  CL 106 108 110 108 112*  CO2 24 24 16* 22 20*  GLUCOSE 227* 159* 107* 132* 93  BUN 31* 31* 30* 29* 31*  CREATININE 2.92* 2.70* 3.08* 3.01* 3.06*  CALCIUM 9.1 9.1 9.1 9.0 8.9  MG  --   --  1.8  --   --    GFR: Estimated Creatinine Clearance: 26.5 mL/min (A) (by C-G formula based on SCr of 3.06 mg/dL (H)). Liver Function Tests: Recent Labs  Lab 04/16/20 0045 04/16/20 0558  AST 21 20  ALT 18 16  ALKPHOS 82 88  BILITOT 0.6 0.6  PROT 5.4* 5.4*  ALBUMIN 1.9* 1.8*   No results for input(s): LIPASE, AMYLASE in the last 168 hours. No results for input(s): AMMONIA in the last 168 hours. Coagulation Profile: Recent Labs  Lab 04/16/20 0021  INR 1.0   Cardiac Enzymes: No results for input(s): CKTOTAL, CKMB, CKMBINDEX, TROPONINI in the last 168 hours. BNP (last 3 results) No results for input(s): PROBNP in the last 8760 hours. HbA1C: Recent Labs    04/16/20 0045 04/16/20 0558  HGBA1C 9.7* 9.6*   CBG: Recent Labs  Lab 04/17/20 1215 04/17/20 1547 04/17/20 2106  04/18/20 0610 04/18/20 1114  GLUCAP 89 139* 136* 117* 82   Lipid Profile: Recent Labs    04/16/20 0045 04/16/20 0558  CHOL 683* 699*  HDL 41 40*  LDLCALC 584* 599*  TRIG 291* 298*  CHOLHDL 16.7 17.5   Thyroid Function Tests: Recent Labs    04/16/20 0045  TSH 2.223   Anemia Panel: No results for input(s): VITAMINB12, FOLATE, FERRITIN, TIBC, IRON, RETICCTPCT in the last 72 hours. Sepsis Labs: No results for input(s): PROCALCITON, LATICACIDVEN in the last 168 hours.  Recent Results (from the past 240 hour(s))  SARS Coronavirus 2 by RT PCR (hospital order, performed in [facility] hospital lab) Nasopharyngeal Nasopharyngeal Swab     Status: None   Collection Time: 04/16/20  1:30 AM   Specimen: Nasopharyngeal Swab  Result Value Ref Range Status   SARS Coronavirus 2 NEGATIVE NEGATIVE Final    Comment: (NOTE) SARS-CoV-2 target nucleic acids are NOT DETECTED.  The SARS-CoV-2 RNA is generally detectable in upper and lower respiratory specimens during the acute phase of infection. The lowest concentration of SARS-CoV-2 viral copies this assay can detect is 250 copies / mL. A negative result does not preclude SARS-CoV-2 infection and should not be used as the sole basis for treatment or other patient management decisions.  A negative result may occur with improper specimen collection / handling, submission of specimen other than nasopharyngeal swab, presence of viral mutation(s) within the areas targeted by this assay, and inadequate number of viral copies (<250 copies / mL). A negative result must be combined with clinical observations, patient history, and epidemiological information.  Fact Sheet for Patients:   StrictlyIdeas.no  Fact Sheet for Healthcare Providers: BankingDealers.co.za  This test is not yet approved or  cleared by the [place] FDA and has been authorized for detection and/or diagnosis of SARS-CoV-2  by FDA under an Emergency Use Authorization (EUA).  This EUA will remain in effect (meaning this test can be used) for the duration of the COVID-19 declaration under Section 564(b)(1) of the Act, 21 U.S.C. section 360bbb-3(b)(1), unless the authorization is terminated or revoked sooner.  Performed at [facility], [place] [address] [place], [place] [address]   Urine Culture     Status: Abnormal   Collection Time: 04/17/20  7:24 AM   Specimen: Urine, Random  Result Value Ref Range Status   Specimen Description URINE, RANDOM  Final   Special Requests   Final    NONE Performed at [facility], [employer] [address] [place], [place] [address]    Culture MULTIPLE SPECIES PRESENT, SUGGEST RECOLLECTION (A)  Final   Report Status 04/18/2020 FINAL  Final         Radiology Studies: No results found.      Scheduled Meds: .  stroke: mapping our early stages of recovery book   Does not apply Once  . amLODipine  5 mg Oral Daily  . aspirin  81 mg Oral Daily  . atorvastatin  80 mg Oral QHS  . enoxaparin (LOVENOX) injection  40 mg Subcutaneous Q24H  . insulin aspart  0-9 Units Subcutaneous TID WC  . insulin glargine  10 Units Subcutaneous Daily  . pantoprazole  40 mg Oral Daily  . sodium bicarbonate  1,300 mg Oral BID   Continuous Infusions: . sodium chloride 75 mL/hr at 04/18/20 0845     LOS: 2 days    Time spent: 35 minutes    Parrie Rasco A Olando Willems, MD [facility]   If 7PM-7AM, please contact night-coverage [email or web address]  04/18/2020, 1:33 PM

## 2020-04-18 NOTE — Anesthesia Postprocedure Evaluation (Signed)
Anesthesia Post Note  Patient: Gualberto Wahlen Full  Procedure(s) Performed: TRANSESOPHAGEAL ECHOCARDIOGRAM (TEE) (N/A ) BUBBLE STUDY     Patient location during evaluation: PACU Anesthesia Type: MAC Level of consciousness: awake and alert Pain management: pain level controlled Vital Signs Assessment: post-procedure vital signs reviewed and stable Respiratory status: spontaneous breathing, nonlabored ventilation, respiratory function stable and patient connected to nasal cannula oxygen Cardiovascular status: stable and blood pressure returned to baseline Postop Assessment: no apparent nausea or vomiting Anesthetic complications: no   No complications documented.  Last Vitals:  Vitals:   04/18/20 1315 04/18/20 1320  BP:  (!) 153/81  Pulse: 88 88  Resp: (!) 21 16  Temp:    SpO2: 100% 100%    Last Pain:  Vitals:   04/18/20 1320  TempSrc:   PainSc: 0-No pain                 Effie Berkshire

## 2020-04-18 NOTE — Interval H&P Note (Signed)
History and Physical Interval Note:  04/18/2020 12:16 PM  Tony Schmitt  has presented today for surgery, with the diagnosis of stroke.  The various methods of treatment have been discussed with the patient and family. After consideration of risks, benefits and other options for treatment, the patient has consented to  Procedure(s): TRANSESOPHAGEAL ECHOCARDIOGRAM (TEE) (N/A) as a surgical intervention.  The patient's history has been reviewed, patient examined, no change in status, stable for surgery.  I have reviewed the patient's chart and labs.  Questions were answered to the patient's satisfaction.     [name]

## 2020-04-18 NOTE — Progress Notes (Signed)
Inpatient Rehabilitation Admissions Coordinator  I met at beside with patient and his Mom. We discussed goals and expectations of a possible Cir admit. They are in agreement. I await medical workup completion and bed availability.  Danne Baxter, RN, MSN Rehab Admissions Coordinator [phone] 04/18/2020 2:45 PM

## 2020-04-18 NOTE — Consult Note (Signed)
Physical Medicine and Rehabilitation Consult Reason for Consult: Left side weakness Referring Physician: [facility]   HPI: Tony Schmitt is a 49 y.o. right-handed male with history of diabetes mellitus, tobacco abuse, CKD stage III, hypertension as well as medical noncompliance.  Per chart review patient lives with mother.  1 level home 2 steps to entry.  Independent prior to admission.  Presented 04/15/2020 with left-sided weakness.  Blood pressure 176/109.  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  Punctate cortical infarct along the inferior left frontal operculum.  No flow signal in the distal left vertebral artery above the craniocervical junction.  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  Patient did not receive TPA.  Admission chemistries alcohol negative, urine drug screen negative, creatinine 2.92, glucose 227, BUN 31, hemoglobin A1c 9.6.  Echocardiogram with ejection fraction of 60 to 65% no wall motion abnormalities.  Currently maintained on aspirin for CVA prophylaxis.  Subcutaneous Lovenox for DVT prophylaxis.  TEE is pending.  Therapy evaluations completed with recommendations of physical medicine rehab consult.   Review of Systems  Constitutional: Negative for chills and fever.  HENT: Negative for hearing loss.   Eyes: Negative for blurred vision and double vision.  Respiratory: Negative for cough and shortness of breath.   Cardiovascular: Negative for chest pain, palpitations and leg swelling.  Gastrointestinal: Positive for constipation. Negative for heartburn, nausea and vomiting.       GERD  Genitourinary: Negative for dysuria, flank pain and hematuria.  Musculoskeletal: Positive for myalgias.  Skin: Negative for rash.  Neurological: Positive  for weakness.  All other systems reviewed and are negative.  Past Medical History:  Diagnosis Date  . Diabetes mellitus without complication ([place])   . Hypertension    History reviewed. No pertinent surgical history. Family History  Problem Relation Age of Onset  . Diabetes Mother   . Diabetes Father   . Hypertension Father   . Cancer Father   . Heart failure Other    Social History:  reports that he has quit smoking. His smoking use included cigarettes and cigars. He has never used smokeless tobacco. He reports previous alcohol use. He reports that he does not use drugs. Allergies: No Known Allergies Medications Prior to Admission  Medication Sig Dispense Refill  . glipiZIDE (GLUCOTROL) 5 MG tablet TAKE 1 TABLET BY MOUTH TWICE DAILY BEFORE A MEAL 60 tablet 0  . lisinopril (ZESTRIL) 20 MG tablet Take 1 tablet (20 mg total) by mouth daily. 90 tablet 0  . amLODipine (NORVASC) 10 MG tablet Take 1 tablet (10 mg total) by mouth daily. 30 tablet 3  . metFORMIN (GLUCOPHAGE) 1000 MG tablet Take 1 tablet (1,000 mg total) by mouth 2 (two) times daily with a meal. 180 tablet 3  . omeprazole (PRILOSEC) 40 MG capsule Take 1 capsule (40 mg total) by mouth daily. 30 capsule 3  . ondansetron (ZOFRAN ODT) 4 MG disintegrating tablet Take 1 tablet (4 mg total) by mouth every 8 (eight) hours as needed for nausea or vomiting. (Patient not taking: Reported on 08/19/2019) 20 tablet 0  . rosuvastatin (CRESTOR) 20 MG tablet Take 1 tablet (20 mg total) by mouth daily. 90 tablet 3    Home: Home Living Family/patient expects to be discharged to:: Private residence Living Arrangements: Parent  Type of Home: House Home Access: Stairs to enter [employer] of Steps: 2 Entrance Stairs-Rails: Right Home Layout: One level Home Equipment: Walker - 2 wheels Additional Comments: tub/shower  Functional History: Prior Function Level of Independence: Independent Functional Status:  Mobility: Bed  Mobility Overal bed mobility: Needs Assistance Bed Mobility: Supine to Sit, Sit to Supine Supine to sit: Mod assist Sit to supine: Min assist General bed mobility comments: Mod assist for hand hold to pull up into sitting and guide legs to side of bed. Transfers Overall transfer level: Needs assistance Equipment used: 1 person hand held assist Transfers: Sit to/from Stand Sit to Stand: Min assist General transfer comment: Min assist to stand and take steps to head of bed. Ambulation/Gait Ambulation/Gait assistance: Min assist Gait Distance (Feet): 25 Feet Assistive device: 1 person hand held assist Gait Pattern/deviations: Decreased step length - right, Decreased weight shift to left, Decreased dorsiflexion - left, Step-to pattern General Gait Details: cues to use R UE for support (provided HHA), pt unable to grip well L hand, observed decreased L DF and pt at times dragging left foot, remained in room    ADL: ADL Overall ADL's : Needs assistance/impaired Eating/Feeding: Set up Grooming: Set up Upper Body Bathing: Minimal assistance, Set up, Sitting Lower Body Bathing: Minimal assistance, Set up, Sit to/from stand Upper Body Dressing : Moderate assistance, Set up, Sitting Lower Body Dressing: Moderate assistance, Sit to/from stand Lower Body Dressing Details (indicate cue type and reason): Unable to donn socks at edge of stretcher. Performed pseudo pant activity and needed min assist. Toilet Transfer: Minimal assistance, Stand-pivot, BSC Toileting- Clothing Manipulation and Hygiene: Minimal assistance Toileting - Clothing Manipulation Details (indicate cue type and reason): Reports wiping himself, partial assistance needed for clothing management  Cognition: Cognition Overall Cognitive Status: Within Functional Limits for tasks assessed Orientation Level: Oriented X4 Cognition Arousal/Alertness: Awake/alert Behavior During Therapy: WFL for tasks assessed/performed Overall  Cognitive Status: Within Functional Limits for tasks assessed  Blood pressure (!) 193/93, pulse 87, temperature 98.8 F (37.1 C), temperature source Oral, resp. rate 17, height 5\' 4"  (1.626 m), weight 73.2 kg, SpO2 100 %. Physical Exam  General: Alert and oriented x 3, No apparent distress HEENT: Head is normocephalic, atraumatic, PERRLA, EOMI, sclera anicteric, oral mucosa pink and moist, dentition intact, ext ear canals clear,  Neck: Supple without JVD or lymphadenopathy Heart: Reg rate and rhythm. No murmurs rubs or gallops Chest: CTA bilaterally without wheezes, rales, or rhonchi; no distress Abdomen: Soft, non-tender, non-distended, bowel sounds positive. Extremities: No clubbing, cyanosis, or edema. Pulses are 2+ Skin: IV in right arm. Left arm edematous.  Neuro: Patient is awake alert oriented x3 and no acute distress.  Makes good eye contact with examiner and follows commands. LUE with 0/5 WE and hand grip. 2/5 1/5 EE, EF, and SA. LLE with 0/5 DF/PF, 1/5 KE, 1/5 HF. Sensation intact Psych: Pt's affect is appropriate. Pt is cooperative. Good sense of humor.    Results for orders placed or performed during the hospital encounter of 04/15/20 (from the past 24 hour(s))  Glucose, capillary     Status: Abnormal   Collection Time: 04/17/20  6:17 AM  Result Value Ref Range   Glucose-Capillary 140 (H) 70 - 99 mg/dL  Magnesium     Status: None   Collection Time: 04/17/20  8:24 AM  Result Value Ref Range   Magnesium 1.8 1.7 - 2.4 mg/dL  Basic metabolic panel     Status: Abnormal   Collection  Time: 04/17/20  8:24 AM  Result Value Ref Range   Sodium 136 135 - 145 mmol/L   Potassium 5.7 (H) 3.5 - 5.1 mmol/L   Chloride 110 98 - 111 mmol/L   CO2 16 (L) 22 - 32 mmol/L   Glucose, Bld 107 (H) 70 - 99 mg/dL   BUN 30 (H) 6 - 20 mg/dL   Creatinine, Ser 3.08 (H) 0.61 - 1.24 mg/dL   Calcium 9.1 8.9 - 10.3 mg/dL   GFR calc non Af Amer 22 (L) >60 mL/min   GFR calc Af Amer 26 (L) >60 mL/min    Anion gap 10 5 - 15  CBC     Status: Abnormal   Collection Time: 04/17/20  8:24 AM  Result Value Ref Range   WBC 10.8 (H) 4.0 - 10.5 K/uL   RBC 3.91 (L) 4.22 - 5.81 MIL/uL   Hemoglobin 10.6 (L) 13.0 - 17.0 g/dL   HCT 33.1 (L) 39 - 52 %   MCV 84.7 80.0 - 100.0 fL   MCH 27.1 26.0 - 34.0 pg   MCHC 32.0 30.0 - 36.0 g/dL   RDW 14.4 11.5 - 15.5 %   Platelets 434 (H) 150 - 400 K/uL   nRBC 0.0 0.0 - 0.2 %  Glucose, capillary     Status: None   Collection Time: 04/17/20 12:15 PM  Result Value Ref Range   Glucose-Capillary 89 70 - 99 mg/dL   Comment 1 Notify RN    Comment 2 Document in Chart   Basic metabolic panel     Status: Abnormal   Collection Time: 04/17/20  2:50 PM  Result Value Ref Range   Sodium 137 135 - 145 mmol/L   Potassium 3.3 (L) 3.5 - 5.1 mmol/L   Chloride 108 98 - 111 mmol/L   CO2 22 22 - 32 mmol/L   Glucose, Bld 132 (H) 70 - 99 mg/dL   BUN 29 (H) 6 - 20 mg/dL   Creatinine, Ser 3.01 (H) 0.61 - 1.24 mg/dL   Calcium 9.0 8.9 - 10.3 mg/dL   GFR calc non Af Amer 23 (L) >60 mL/min   GFR calc Af Amer 27 (L) >60 mL/min   Anion gap 7 5 - 15  Glucose, capillary     Status: Abnormal   Collection Time: 04/17/20  3:47 PM  Result Value Ref Range   Glucose-Capillary 139 (H) 70 - 99 mg/dL   Comment 1 Notify RN    Comment 2 Document in Chart   Glucose, capillary     Status: Abnormal   Collection Time: 04/17/20  9:06 PM  Result Value Ref Range   Glucose-Capillary 136 (H) 70 - 99 mg/dL   MR ANGIO HEAD WO CONTRAST  Result Date: 04/16/2020 CLINICAL DATA:  New onset of left upper extremity weakness. Stroke symptoms have progressed significantly overnight. EXAM: MRI HEAD WITHOUT CONTRAST MRA HEAD WITHOUT CONTRAST MRA NECK WITHOUT CONTRAST TECHNIQUE: Multiplanar, multiecho pulse sequences of the brain and surrounding structures were obtained without intravenous contrast. Angiographic images of the Circle of Willis were obtained using MRA technique without intravenous contrast.  Angiographic images of the neck were obtained using MRA technique without intravenous contrast. Carotid stenosis measurements (when applicable) are obtained utilizing NASCET criteria, using the distal internal carotid diameter as the denominator. COMPARISON:  CT head without contrast 04/16/2020 FINDINGS: MRI HEAD FINDINGS Brain: The diffusion-weighted images confirm acute/subacute nonhemorrhagic infarcts involving the posterior right corona radiata. White matter infarct in the anterior aspect of the left external  capsule, just anterior to the lentiform nucleus measures up to 13 mm. Punctate cortical infarct is present along the inferior left frontal operculum. T2 signal changes are associated with each of these areas. More remote lacunar infarct is present at the left globus pallidus. The basal ganglia are otherwise intact Periventricular and subcortical T2 signal changes are otherwise mildly advanced for age. The internal auditory canals are within normal limits. The brainstem and cerebellum are within normal limits. Vascular: The distal left vertebral artery is not visualized. Flow is otherwise present in the major intracranial arteries. Skull and upper cervical spine: Craniocervical junction is normal. Flow is present in the vertebral arteries bilaterally. Visualized intracranial contents are normal. Sinuses/Orbits: Right greater than left mastoid effusion is present. The paranasal sinuses and mastoid air cells are otherwise clear. The globes and orbits are within normal limits. MRA HEAD FINDINGS Atherosclerotic irregularity is present in the cavernous internal carotid arteries bilaterally. 50-60% stenosis is present in the supraclinoid right ICA. No other significant stenoses are present through the ICA termini. The anterior communicating artery is patent. MCA bifurcations are intact. Asymmetric attenuation of distal left MCA branch vessels is noted without a significant proximal stenosis or occlusion. No flow  signal is present in the left vertebral artery. Moderate to high-grade stenosis is present at the junction at the V3 and V4 segments with segmental narrowing of the V4 segment above this level. Moderate proximal basilar artery stenosis is present. Basilar artery is small, terminating at the superior cerebellar arteries. Both posterior cerebral arteries are of fetal type. Moderate stenoses are present in the right posterior communicating artery with asymmetric attenuation of distal PCA branch vessels on the right. MRA NECK FINDINGS Time of flight imaging demonstrates common origin of the left common carotid artery and innominate artery. No significant proximal stenoses are present. MCA bifurcations are intact without significant flow disturbance. Flow is antegrade in the vertebral arteries bilaterally. The right vertebral artery is the dominant vessel. No flow is present in the distal left vertebral artery above the craniocervical junction. IMPRESSION: 1. Acute/subacute nonhemorrhagic infarcts involving the posterior right corona radiata and anterior aspect of the left external capsule, corresponding with patient's symptoms. Right-sided infarcts likely impacts cortical spinal tracts involving left motor function. 2. Punctate cortical infarct along the inferior left frontal operculum. 3. Periventricular and subcortical T2 signal changes are otherwise mildly advanced for age. This likely reflects the sequela of chronic microvascular ischemia. 4. No flow signal in the distal left vertebral artery above the craniocervical junction. 5. Moderate to high-grade stenosis at the junction at the right V3 and V4 segments with segmental narrowing of the V4 segment above this level. 6. Moderate proximal basilar artery stenosis. 7. Atherosclerotic irregularity in the cavernous internal carotid arteries bilaterally with a 50-60% stenosis in the supraclinoid right ICA. 8. Moderate stenoses of the right posterior communicating artery  with asymmetric attenuation of distal PCA branch vessels on the right. The right posterior cerebral artery is of fetal type. 9. No significant proximal stenosis in the neck. Electronically Signed   By: San Morelle M.D.   On: 04/16/2020 12:19   MR ANGIO NECK WO CONTRAST  Result Date: 04/16/2020 CLINICAL DATA:  New onset of left upper extremity weakness. Stroke symptoms have progressed significantly overnight. EXAM: MRI HEAD WITHOUT CONTRAST MRA HEAD WITHOUT CONTRAST MRA NECK WITHOUT CONTRAST TECHNIQUE: Multiplanar, multiecho pulse sequences of the brain and surrounding structures were obtained without intravenous contrast. Angiographic images of the Circle of Willis were obtained using MRA technique  without intravenous contrast. Angiographic images of the neck were obtained using MRA technique without intravenous contrast. Carotid stenosis measurements (when applicable) are obtained utilizing NASCET criteria, using the distal internal carotid diameter as the denominator. COMPARISON:  CT head without contrast 04/16/2020 FINDINGS: MRI HEAD FINDINGS Brain: The diffusion-weighted images confirm acute/subacute nonhemorrhagic infarcts involving the posterior right corona radiata. White matter infarct in the anterior aspect of the left external capsule, just anterior to the lentiform nucleus measures up to 13 mm. Punctate cortical infarct is present along the inferior left frontal operculum. T2 signal changes are associated with each of these areas. More remote lacunar infarct is present at the left globus pallidus. The basal ganglia are otherwise intact Periventricular and subcortical T2 signal changes are otherwise mildly advanced for age. The internal auditory canals are within normal limits. The brainstem and cerebellum are within normal limits. Vascular: The distal left vertebral artery is not visualized. Flow is otherwise present in the major intracranial arteries. Skull and upper cervical spine:  Craniocervical junction is normal. Flow is present in the vertebral arteries bilaterally. Visualized intracranial contents are normal. Sinuses/Orbits: Right greater than left mastoid effusion is present. The paranasal sinuses and mastoid air cells are otherwise clear. The globes and orbits are within normal limits. MRA HEAD FINDINGS Atherosclerotic irregularity is present in the cavernous internal carotid arteries bilaterally. 50-60% stenosis is present in the supraclinoid right ICA. No other significant stenoses are present through the ICA termini. The anterior communicating artery is patent. MCA bifurcations are intact. Asymmetric attenuation of distal left MCA branch vessels is noted without a significant proximal stenosis or occlusion. No flow signal is present in the left vertebral artery. Moderate to high-grade stenosis is present at the junction at the V3 and V4 segments with segmental narrowing of the V4 segment above this level. Moderate proximal basilar artery stenosis is present. Basilar artery is small, terminating at the superior cerebellar arteries. Both posterior cerebral arteries are of fetal type. Moderate stenoses are present in the right posterior communicating artery with asymmetric attenuation of distal PCA branch vessels on the right. MRA NECK FINDINGS Time of flight imaging demonstrates common origin of the left common carotid artery and innominate artery. No significant proximal stenoses are present. MCA bifurcations are intact without significant flow disturbance. Flow is antegrade in the vertebral arteries bilaterally. The right vertebral artery is the dominant vessel. No flow is present in the distal left vertebral artery above the craniocervical junction. IMPRESSION: 1. Acute/subacute nonhemorrhagic infarcts involving the posterior right corona radiata and anterior aspect of the left external capsule, corresponding with patient's symptoms. Right-sided infarcts likely impacts cortical  spinal tracts involving left motor function. 2. Punctate cortical infarct along the inferior left frontal operculum. 3. Periventricular and subcortical T2 signal changes are otherwise mildly advanced for age. This likely reflects the sequela of chronic microvascular ischemia. 4. No flow signal in the distal left vertebral artery above the craniocervical junction. 5. Moderate to high-grade stenosis at the junction at the right V3 and V4 segments with segmental narrowing of the V4 segment above this level. 6. Moderate proximal basilar artery stenosis. 7. Atherosclerotic irregularity in the cavernous internal carotid arteries bilaterally with a 50-60% stenosis in the supraclinoid right ICA. 8. Moderate stenoses of the right posterior communicating artery with asymmetric attenuation of distal PCA branch vessels on the right. The right posterior cerebral artery is of fetal type. 9. No significant proximal stenosis in the neck. Electronically Signed   By: Wynetta Fines.D.  On: 04/16/2020 12:19   MR Brain Wo Contrast (neuro protocol)  Result Date: 04/16/2020 CLINICAL DATA:  New onset of left upper extremity weakness. Stroke symptoms have progressed significantly overnight. EXAM: MRI HEAD WITHOUT CONTRAST MRA HEAD WITHOUT CONTRAST MRA NECK WITHOUT CONTRAST TECHNIQUE: Multiplanar, multiecho pulse sequences of the brain and surrounding structures were obtained without intravenous contrast. Angiographic images of the Circle of Willis were obtained using MRA technique without intravenous contrast. Angiographic images of the neck were obtained using MRA technique without intravenous contrast. Carotid stenosis measurements (when applicable) are obtained utilizing NASCET criteria, using the distal internal carotid diameter as the denominator. COMPARISON:  CT head without contrast 04/16/2020 FINDINGS: MRI HEAD FINDINGS Brain: The diffusion-weighted images confirm acute/subacute nonhemorrhagic infarcts involving the  posterior right corona radiata. White matter infarct in the anterior aspect of the left external capsule, just anterior to the lentiform nucleus measures up to 13 mm. Punctate cortical infarct is present along the inferior left frontal operculum. T2 signal changes are associated with each of these areas. More remote lacunar infarct is present at the left globus pallidus. The basal ganglia are otherwise intact Periventricular and subcortical T2 signal changes are otherwise mildly advanced for age. The internal auditory canals are within normal limits. The brainstem and cerebellum are within normal limits. Vascular: The distal left vertebral artery is not visualized. Flow is otherwise present in the major intracranial arteries. Skull and upper cervical spine: Craniocervical junction is normal. Flow is present in the vertebral arteries bilaterally. Visualized intracranial contents are normal. Sinuses/Orbits: Right greater than left mastoid effusion is present. The paranasal sinuses and mastoid air cells are otherwise clear. The globes and orbits are within normal limits. MRA HEAD FINDINGS Atherosclerotic irregularity is present in the cavernous internal carotid arteries bilaterally. 50-60% stenosis is present in the supraclinoid right ICA. No other significant stenoses are present through the ICA termini. The anterior communicating artery is patent. MCA bifurcations are intact. Asymmetric attenuation of distal left MCA branch vessels is noted without a significant proximal stenosis or occlusion. No flow signal is present in the left vertebral artery. Moderate to high-grade stenosis is present at the junction at the V3 and V4 segments with segmental narrowing of the V4 segment above this level. Moderate proximal basilar artery stenosis is present. Basilar artery is small, terminating at the superior cerebellar arteries. Both posterior cerebral arteries are of fetal type. Moderate stenoses are present in the right  posterior communicating artery with asymmetric attenuation of distal PCA branch vessels on the right. MRA NECK FINDINGS Time of flight imaging demonstrates common origin of the left common carotid artery and innominate artery. No significant proximal stenoses are present. MCA bifurcations are intact without significant flow disturbance. Flow is antegrade in the vertebral arteries bilaterally. The right vertebral artery is the dominant vessel. No flow is present in the distal left vertebral artery above the craniocervical junction. IMPRESSION: 1. Acute/subacute nonhemorrhagic infarcts involving the posterior right corona radiata and anterior aspect of the left external capsule, corresponding with patient's symptoms. Right-sided infarcts likely impacts cortical spinal tracts involving left motor function. 2. Punctate cortical infarct along the inferior left frontal operculum. 3. Periventricular and subcortical T2 signal changes are otherwise mildly advanced for age. This likely reflects the sequela of chronic microvascular ischemia. 4. No flow signal in the distal left vertebral artery above the craniocervical junction. 5. Moderate to high-grade stenosis at the junction at the right V3 and V4 segments with segmental narrowing of the V4 segment above this level. 6.  Moderate proximal basilar artery stenosis. 7. Atherosclerotic irregularity in the cavernous internal carotid arteries bilaterally with a 50-60% stenosis in the supraclinoid right ICA. 8. Moderate stenoses of the right posterior communicating artery with asymmetric attenuation of distal PCA branch vessels on the right. The right posterior cerebral artery is of fetal type. 9. No significant proximal stenosis in the neck. Electronically Signed   By: San Morelle M.D.   On: 04/16/2020 12:19   ECHOCARDIOGRAM COMPLETE BUBBLE STUDY  Result Date: 04/16/2020    ECHOCARDIOGRAM REPORT   Patient Name:   ARREN LAMINACK [place] Date of Exam: 04/16/2020 Medical Rec #:   [id number]     Height:       64.0 in Accession #:    [id number]    Weight:       156.0 lb Date of Birth:  [birth year]     BSA:          1.760 m Patient Age:    67 years      BP:           199/106 mmHg Patient Gender: M             HR:           99 bpm. Exam Location:  Inpatient Procedure: 2D Echo Indications:    434.91 stroke  History:        Patient has prior history of Echocardiogram examinations, most                 recent 04/18/2019. Risk Factors:Hypertension, Diabetes and Former                 Smoker.  Sonographer:    Jannett Celestine RDCS (AE) Referring Phys: [place]  1. Left ventricular ejection fraction, by estimation, is 60 to 65%. The left ventricle has normal function. The left ventricle has no regional wall motion abnormalities. There is mild concentric left ventricular hypertrophy. Left ventricular diastolic function could not be evaluated.  2. Right ventricular systolic function is normal. The right ventricular size is normal. Tricuspid regurgitation signal is inadequate for assessing PA pressure.  3. The mitral valve is normal in structure. No evidence of mitral valve regurgitation. No evidence of mitral stenosis.  4. The aortic valve is normal in structure. Aortic valve regurgitation is not visualized. No aortic stenosis is present.  5. The inferior vena cava is normal in size with greater than 50% respiratory variability, suggesting right atrial pressure of 3 mmHg. FINDINGS  Left Ventricle: Left ventricular ejection fraction, by estimation, is 60 to 65%. The left ventricle has normal function. The left ventricle has no regional wall motion abnormalities. The left ventricular internal cavity size was normal in size. There is  mild concentric left ventricular hypertrophy. Left ventricular diastolic function could not be evaluated. Right Ventricle: The right ventricular size is normal. No increase in right ventricular wall thickness. Right ventricular systolic function is  normal. Tricuspid regurgitation signal is inadequate for assessing PA pressure. Left Atrium: Left atrial size was normal in size. Right Atrium: Right atrial size was normal in size. Pericardium: There is no evidence of pericardial effusion. Mitral Valve: The mitral valve is normal in structure. No evidence of mitral valve regurgitation. No evidence of mitral valve stenosis. Tricuspid Valve: The tricuspid valve is normal in structure. Tricuspid valve regurgitation is not demonstrated. No evidence of tricuspid stenosis. Aortic Valve: The aortic valve is normal in structure. Aortic valve regurgitation is not visualized. No aortic stenosis is  present. Pulmonic Valve: The pulmonic valve was normal in structure. Pulmonic valve regurgitation is not visualized. No evidence of pulmonic stenosis. Aorta: The aortic root is normal in size and structure. Venous: The inferior vena cava is normal in size with greater than 50% respiratory variability, suggesting right atrial pressure of 3 mmHg. IAS/Shunts: No atrial level shunt detected by color flow Doppler.  LEFT VENTRICLE PLAX 2D LVIDd:         4.30 cm LVIDs:         2.40 cm LV PW:         1.20 cm LV IVS:        1.20 cm LVOT diam:     2.00 cm LV SV:         49 LV SV Index:   28 LVOT Area:     3.14 cm  RIGHT VENTRICLE RV S prime:     13.90 cm/s TAPSE (M-mode): 2.1 cm LEFT ATRIUM             Index LA diam:        2.90 cm 1.65 cm/m LA Vol (A2C):   26.8 ml 15.22 ml/m LA Vol (A4C):   49.3 ml 28.01 ml/m LA Biplane Vol: 36.5 ml 20.73 ml/m  AORTIC VALVE LVOT Vmax:   89.50 cm/s LVOT Vmean:  65.200 cm/s LVOT VTI:    0.157 m  AORTA Ao Root diam: 3.20 cm  SHUNTS Systemic VTI:  0.16 m Systemic Diam: 2.00 cm [name] MD Electronically signed by [name] MD Signature Date/Time: 04/16/2020/9:31:26 AM    Final      Assessment/Plan: Diagnosis: Multiple bilateral infarcts 1. Does the need for close, 24 hr/day medical supervision in concert with the patient's rehab needs make it  unreasonable for this patient to be served in a less intensive setting? Yes 2. Co-Morbidities requiring supervision/potential complications: HTN, DM, HLD, left sided weakness, overweight (BMI 27.7), anemia, CKD, mild leukocytosis, low grade tachycardia 3. Due to bladder management, bowel management, safety, skin/wound care, disease management, medication administration, pain management and patient education, does the patient require 24 hr/day rehab nursing? Yes 4. Does the patient require coordinated care of a physician, rehab nurse, therapy disciplines of PT, OT to address physical and functional deficits in the context of the above medical diagnosis(es)? Yes Addressing deficits in the following areas: balance, endurance, locomotion, strength, transferring, bowel/bladder control, bathing, dressing, feeding, grooming, toileting and psychosocial support 5. Can the patient actively participate in an intensive therapy program of at least 3 hrs of therapy per day at least 5 days per week? Yes 6. The potential for patient to make measurable gains while on inpatient rehab is excellent 7. Anticipated functional outcomes upon discharge from inpatient rehab are modified independent  with PT, modified independent with OT, independent with SLP. 8. Estimated rehab length of stay to reach the above functional goals is: 12-16 days 9. Anticipated discharge destination: Home 10. Overall Rehab/Functional Prognosis: excellent  RECOMMENDATIONS: This patient's condition is appropriate for continued rehabilitative care in the following setting: CIR Patient has agreed to participate in recommended program. Yes Note that insurance prior authorization may be required for reimbursement for recommended care.  Comment:  Recommendations: 1) Left sided AFO and left sided resting hand splint  Thank you for this consult. Admission coordinator to follow.   I have personally performed a face to face diagnostic evaluation,  including, but not limited to relevant history and physical exam findings, of this patient and developed relevant assessment and plan.  Additionally, I have reviewed and concur with the physician assistant's documentation above.  Leeroy Cha, MD  Lavon Paganini [place], PA-C 04/18/2020

## 2020-04-18 NOTE — Interval H&P Note (Signed)
History and Physical Interval Note:  04/18/2020 10:43 AM  Tony Schmitt  has presented today for surgery, with the diagnosis of stroke.  The various methods of treatment have been discussed with the patient and family. After consideration of risks, benefits and other options for treatment, the patient has consented to  Procedure(s): TRANSESOPHAGEAL ECHOCARDIOGRAM (TEE) (N/A) as a surgical intervention.  The patient's history has been reviewed, patient examined, no change in status, stable for surgery.  I have reviewed the patient's chart and labs.  Questions were answered to the patient's satisfaction.     [name]

## 2020-04-18 NOTE — Progress Notes (Signed)
Physical Therapy Treatment Patient Details Name: Tony Schmitt MRN: [id number] DOB: 21-Apr-1971 Today's Date: 04/18/2020    History of Present Illness 49 y.o. male with history of HTN, DM, CKD stage III presents to the ER with left-sided weakness. CT head- acute right periventricular white matter infarct. MRI brain -acute to subacute nonhemorrhagic infarcts in the posterior right corona radiata and anterior aspect of left external capsule.    PT Comments    Patient reports feeling weaker than he did when he arrived to the hospital; now has flaccid LUE and grossly 0/5 DF, 1/5 knee flexion and 1/5 knee extension with MMT of LLE. At [facility], pt able to walk around room, however function has changed drastically since then. Pt now requires Mod A for bed mobility and to stand from EOB supporting left knee from buckling. Pt laying with wet gown and pad saturated with urine and did not let anyone know. Will need second person to attempt gait training for safety. Session limited as transport waiting to take pt down for TEE. Continue to recommend CIR. Will follow.   Follow Up Recommendations  CIR     Equipment Recommendations  Other (comment) (defer to next venue)    Recommendations for Other Services Rehab consult     Precautions / Restrictions Precautions Precautions: Fall Precaution Comments: left hemiplegia Restrictions Weight Bearing Restrictions: No    Mobility  Bed Mobility Overal bed mobility: Needs Assistance Bed Mobility: Rolling;Sidelying to Sit Rolling: Min guard Sidelying to sit: Mod assist;HOB elevated   Sit to supine: Mod assist;HOB elevated   General bed mobility comments: Cues to reach for rail with RUE, assist with trunk. Able to bring LEs to EOB and scoot bottom.  Transfers Overall transfer level: Needs assistance Equipment used: None (holding onto back of chair with RUE to stand) Transfers: Sit to/from Stand Sit to Stand: Min assist         General transfer  comment: Assist to power to standing using back of chair to pull up on; therapist supporting left knee.  Ambulation/Gait             General Gait Details: Deferred due to needing second person, LLE weakness and transport present to take pt down for TEE.   Stairs             Wheelchair Mobility    Modified Rankin (Stroke Patients Only) Modified Rankin (Stroke Patients Only) Pre-Morbid Rankin Score: No symptoms Modified Rankin: Severe disability     Balance Overall balance assessment: Needs assistance Sitting-balance support: Feet supported;No upper extremity supported Sitting balance-Leahy Scale: Fair Sitting balance - Comments: Initially LOB posteriorly without UE support needing Min A, improved with time to Min guard.   Standing balance support: During functional activity Standing balance-Leahy Scale: Poor Standing balance comment: requires UE support in standing and therapist supporting left knee. Stood to change chuck pads                            Cognition Arousal/Alertness: Awake/alert Behavior During Therapy: Flat affect Overall Cognitive Status: No family/caregiver present to determine baseline cognitive functioning                                 General Comments: Pt sitting in wet gown with chuck pad saturated in urine. A&Ox4.      Exercises General Exercises - Lower Extremity Quad Sets: AROM;Both;10 reps;Supine Heel  Slides: AAROM;Left;5 reps;Supine    General Comments General comments (skin integrity, edema, etc.): mother present during session; left hand edematous. Encouraged elevating on pillows.      Pertinent Vitals/Pain Pain Assessment: No/denies pain    Home Living     Available Help at Discharge: Family                Prior Function            PT Goals (current goals can now be found in the care plan section) Progress towards PT goals: Not progressing toward goals - comment (worsening  weakness)    Frequency    Min 4X/week      PT Plan Current plan remains appropriate    Co-evaluation              AM-PAC PT "6 Clicks" Mobility   Outcome Measure  Help needed turning from your back to your side while in a flat bed without using bedrails?: A Little Help needed moving from lying on your back to sitting on the side of a flat bed without using bedrails?: A Lot Help needed moving to and from a bed to a chair (including a wheelchair)?: A Lot Help needed standing up from a chair using your arms (e.g., wheelchair or bedside chair)?: A Lot Help needed to walk in hospital room?: A Lot Help needed climbing 3-5 steps with a railing? : Total 6 Click Score: 12    End of Session Equipment Utilized During Treatment: Gait belt Activity Tolerance: Patient tolerated treatment well Patient left: in bed;with call bell/phone within reach;with bed alarm set;with family/visitor present;Other (comment) (transport in room to take pt down to TEE) Nurse Communication: Mobility status PT Visit Diagnosis: Other abnormalities of gait and mobility (R26.89);Hemiplegia and hemiparesis Hemiplegia - Right/Left: Left Hemiplegia - dominant/non-dominant: Non-dominant Hemiplegia - caused by: Cerebral infarction     Time: 5364-6803 PT Time Calculation (min) (ACUTE ONLY): 20 min  Charges:  $Therapeutic Activity: 8-22 mins                     Tony Schmitt, PT, DPT Acute Rehabilitation Services Pager [phone] Office [phone]       Tony Schmitt 04/18/2020, 1:15 PM

## 2020-04-18 NOTE — Anesthesia Preprocedure Evaluation (Signed)
Anesthesia Evaluation  Patient identified by MRN, date of birth, ID band Patient awake    Reviewed: Allergy & Precautions, NPO status , Patient's Chart, lab work & pertinent test results  Airway Mallampati: I  TM Distance: >3 FB Neck ROM: Full    Dental  (+) Edentulous Lower, Edentulous Upper   Pulmonary former smoker,    breath sounds clear to auscultation       Cardiovascular hypertension, Pt. on medications  Rhythm:Regular Rate:Normal     Neuro/Psych CVA    GI/Hepatic Neg liver ROS, GERD  Medicated,  Endo/Other  diabetes, Type 2, Oral Hypoglycemic Agents  Renal/GU CRF and Renal InsufficiencyRenal disease     Musculoskeletal   Abdominal Normal abdominal exam  (+)   Peds  Hematology negative hematology ROS (+)   Anesthesia Other Findings   Reproductive/Obstetrics                             Anesthesia Physical Anesthesia Plan  ASA: III  Anesthesia Plan: MAC   Post-op Pain Management:    Induction: Intravenous  PONV Risk Score and Plan: 0 and Propofol infusion  Airway Management Planned: Natural Airway and Simple Face Mask  Additional Equipment: None  Intra-op Plan:   Post-operative Plan:   Informed Consent: I have reviewed the patients History and Physical, chart, labs and discussed the procedure including the risks, benefits and alternatives for the proposed anesthesia with the patient or authorized representative who has indicated his/her understanding and acceptance.       Plan Discussed with: CRNA  Anesthesia Plan Comments: (Echo:  1. Left ventricular ejection fraction, by estimation, is 60 to 65%. The  left ventricle has normal function. The left ventricle has no regional  wall motion abnormalities. There is mild concentric left ventricular  hypertrophy. Left ventricular diastolic  function could not be evaluated.  2. Right ventricular systolic function is  normal. The right ventricular  size is normal. Tricuspid regurgitation signal is inadequate for assessing  PA pressure.  3. The mitral valve is normal in structure. No evidence of mitral valve  regurgitation. No evidence of mitral stenosis.  4. The aortic valve is normal in structure. Aortic valve regurgitation is  not visualized. No aortic stenosis is present.  5. The inferior vena cava is normal in size with greater than 50%  respiratory variability, suggesting right atrial pressure of 3 mmHg. )        Anesthesia Quick Evaluation

## 2020-04-18 NOTE — Transfer of Care (Signed)
Immediate Anesthesia Transfer of Care Note  Patient: Tony Schmitt  Procedure(s) Performed: TRANSESOPHAGEAL ECHOCARDIOGRAM (TEE) (N/A ) BUBBLE STUDY  Patient Location: Endoscopy Unit  Anesthesia Type:MAC  Level of Consciousness: sedated and obtunded  Airway & Oxygen Therapy: Patient Spontanous Breathing and Patient connected to nasal cannula oxygen  Post-op Assessment: Report given to RN, Post -op Vital signs reviewed and stable and Patient moving all extremities  Post vital signs: Reviewed and stable  Last Vitals:  Vitals Value Taken Time  BP 124/69 04/18/20 1306  Temp    Pulse 85 04/18/20 1307  Resp 15 04/18/20 1307  SpO2 100 % 04/18/20 1307  Vitals shown include unvalidated device data.  Last Pain:  Vitals:   04/18/20 1217  TempSrc: Axillary  PainSc: 0-No pain         Complications: No complications documented.

## 2020-04-18 NOTE — Evaluation (Signed)
Speech Language Pathology Evaluation Patient Details Name: Tony Schmitt MRN: [id number] DOB: 1971-02-20 Today's Date: 04/18/2020 Time: 1601-0932 SLP Time Calculation (min) (ACUTE ONLY): 25 min  Problem List:  Patient Active Problem List   Diagnosis Date Noted  . Acute CVA (cerebrovascular accident) ([place]) 04/16/2020  . Hypertensive urgency 04/16/2020  . Type 2 diabetes mellitus with vascular disease ([place]) 04/16/2020  . CKD (chronic kidney disease) stage 3, GFR 30-59 ml/min 04/23/2019  . Hyperlipidemia 04/18/2019  . Dyslipidemia associated with type 2 diabetes mellitus ([place]) 08/29/2018  . Hypertension associated with type 2 diabetes mellitus ([place]) 08/29/2018   Past Medical History:  Past Medical History:  Diagnosis Date  . Diabetes mellitus without complication ([place])   . Hypertension    Past Surgical History: History reviewed. No pertinent surgical history. HPI:  49yo male admitted 04/15/20 with left side weakness. PMH: HTN, HLD, CKD3, DM2. CTHead - multiple old lacunar infarcts, possible acute right periventricular white matter infarct. MRI - Multiple infarcts involving bilateral hemispheres in the anterior circulation concerning for embolic source.   Assessment / Plan / Recommendation Clinical Impression  The [place] Mental Status (SLUMS) Examination was administered. Pt scored 27/30, which is within functional limits for pt education.  Points were lost on mental math and delayed recall (4/5 remembered). Pt was encouraged to notigy PCP if difficulties arise upon return to normal routines. Continued ST intervention is not recommended at this time.    SLP Assessment  SLP Recommendation/Assessment: Patient does not need any further Speech Language Pathology Services  SLP Visit Diagnosis: Cognitive communication deficit (R41.841)    Follow Up Recommendations  None       SLP Evaluation Cognition  Overall Cognitive Status: Within Functional Limits for tasks  assessed Arousal/Alertness: Awake/alert Orientation Level: Oriented X4       Comprehension  Auditory Comprehension Overall Auditory Comprehension: Appears within functional limits for tasks assessed    Expression Expression Primary Mode of Expression: Verbal Verbal Expression Overall Verbal Expression: Appears within functional limits for tasks assessed Written Expression Dominant Hand: Right   Oral / Motor  Oral Motor/Sensory Function Overall Oral Motor/Sensory Function: Within functional limits Motor Speech Overall Motor Speech: Appears within functional limits for tasks assessed Intelligibility: Intelligible   GO                   Marce Schartz B. Quentin Ore, [facility], [place] Speech Language Pathologist Office: [phone] Pager: [phone]  Shonna Chock 04/18/2020, 11:34 AM

## 2020-04-18 NOTE — Progress Notes (Signed)
TCD bubble study, carotid duplex, upper extremity venous & lower extremity venous has been completed.   Preliminary results in CV Proc.   Abram Sander 04/18/2020 2:50 PM

## 2020-04-18 NOTE — CV Procedure (Signed)
    PROCEDURE NOTE:  Procedure:  Transesophageal echocardiogram Operator:  [name], MD Indications:  CVA Complications: None  During this procedure the patient is administered a total of Propofol 400 mg to achieve and maintain moderate conscious sedation and 40mg  Lidocaine.  The patient's heart rate, blood pressure, and oxygen saturation are monitored continuously during the procedure by anesthesia with Dr. Smith Robert.   Results: Normal LV size and function with EF 60-65% Normal RV size and function Normal RA Normal LA and LA appendage with no evidence of thrombus and normal emptying velocity Normal TV Normal PV with trivial PR Normal MV with trivial MR Normal trileaflet AV Possible very small PFO -agitated saline contrast study showed late bubbles after 7 cardiac cycles with valsalva. Mild atherosclerosis of the thoracic and ascending aorta.  The patient tolerated the procedure well and was transferred back to their room in stable condition.  Signed: [name], MD [facility]

## 2020-04-18 NOTE — Progress Notes (Signed)
OT Cancellation Note  Patient Details Name: Tony Schmitt MRN: [id number] DOB: [birth year]   Cancelled Treatment:    Reason Eval/Treat Not Completed: Patient at procedure or test/ unavailable. Pt getting ready to leave room for TEE.  [place], OTR/L Acute Rehab Services Pager [phone] Office [phone]     Almon Register 04/18/2020, 12:06 PM

## 2020-04-18 NOTE — Progress Notes (Signed)
°  Echocardiogram TEE has been performed.  Tony Schmitt 04/18/2020, 1:20 PM

## 2020-04-19 LAB — ANA W/REFLEX IF POSITIVE: Anti Nuclear Antibody (ANA): NEGATIVE

## 2020-04-19 LAB — BASIC METABOLIC PANEL
Anion gap: 7 (ref 5–15)
BUN: 32 mg/dL — ABNORMAL HIGH (ref 6–20)
CO2: 21 mmol/L — ABNORMAL LOW (ref 22–32)
Calcium: 8.6 mg/dL — ABNORMAL LOW (ref 8.9–10.3)
Chloride: 108 mmol/L (ref 98–111)
Creatinine, Ser: 2.93 mg/dL — ABNORMAL HIGH (ref 0.61–1.24)
GFR calc Af Amer: 28 mL/min — ABNORMAL LOW (ref >60)
GFR calc non Af Amer: 24 mL/min — ABNORMAL LOW (ref >60)
Glucose, Bld: 139 mg/dL — ABNORMAL HIGH (ref 70–99)
Potassium: 3.4 mmol/L — ABNORMAL LOW (ref 3.5–5.1)
Sodium: 136 mmol/L (ref 135–145)

## 2020-04-19 LAB — GLUCOSE, CAPILLARY
Glucose-Capillary: 156 mg/dL — ABNORMAL HIGH (ref 70–99)
Glucose-Capillary: 162 mg/dL — ABNORMAL HIGH (ref 70–99)
Glucose-Capillary: 112 mg/dL — ABNORMAL HIGH (ref 70–99)
Glucose-Capillary: 169 mg/dL — ABNORMAL HIGH (ref 70–99)

## 2020-04-19 MED ORDER — ATORVASTATIN CALCIUM 80 MG PO TABS: 80.0000 mg | ORAL_TABLET | Freq: Every day | ORAL | 0 refills | Status: DC

## 2020-04-19 MED ORDER — METOPROLOL TARTRATE 12.5 MG HALF TABLET: 12.5000 mg | ORAL_TABLET | Freq: Two times a day (BID) | ORAL | Status: DC

## 2020-04-19 MED ORDER — POTASSIUM CHLORIDE CRYS ER 20 MEQ PO TBCR
20.0000 meq | EXTENDED_RELEASE_TABLET | Freq: Once | ORAL | Status: AC
  Administered 2020-04-19: 20 meq via ORAL
  Filled 2020-04-19: qty 1

## 2020-04-19 MED ORDER — SODIUM BICARBONATE 650 MG PO TABS: 1300.0000 mg | ORAL_TABLET | Freq: Two times a day (BID) | ORAL | 0 refills | Status: DC

## 2020-04-19 MED ORDER — INSULIN GLARGINE 100 UNIT/ML ~~LOC~~ SOLN: 10.0000 [IU] | Freq: Every day | SUBCUTANEOUS | 11 refills | Status: DC

## 2020-04-19 MED ORDER — ASPIRIN 81 MG PO CHEW: 81.0000 mg | CHEWABLE_TABLET | Freq: Every day | ORAL | 0 refills | Status: AC

## 2020-04-19 MED ORDER — AMLODIPINE BESYLATE 5 MG PO TABS
5.0000 mg | ORAL_TABLET | Freq: Every day | ORAL | 0 refills | Status: DC
Start: 2020-04-20 — End: 2020-05-12

## 2020-04-19 MED ORDER — ENOXAPARIN SODIUM 30 MG/0.3ML ~~LOC~~ SOLN
30.0000 mg | SUBCUTANEOUS | Status: DC
  Administered 2020-04-20 – 2020-04-22 (×3): 30 mg via SUBCUTANEOUS
  Filled 2020-04-19 (×3): qty 0.3

## 2020-04-19 NOTE — Progress Notes (Signed)
Inpatient Rehabilitation Admissions Coordinator  I do not have a CIR bed to admit this patient today.  Danne Baxter, RN, MSN Rehab Admissions Coordinator [phone] 04/19/2020 11:19 AM

## 2020-04-19 NOTE — Discharge Summary (Signed)
Physician Discharge Summary  Tony Schmitt HFS:[id number] DOB: [birth year] DOA: 04/15/2020  PCP: No primary care provider on file.  Admit date: 04/15/2020 Discharge date: 04/20/2020  Admitted From: Home  Disposition: CIR   Recommendations for Outpatient Follow-up:  1. Follow up with PCP in 1-2 weeks 2. Please obtain BMP/CBC in one week 3. Needs Holter monitor arrange out patient.     Discharge Condition: Stable.  CODE STATUS; full code Diet recommendation: Heart Healthy   Brief/Interim Summary: 49 year old PMH hypertension, diabetes, CKD stage IIIa who presents to the ED with left-sided weakness. Patient weakness a started on September 04/2020 evening but it was mild. He wake up on 9/10 with worsening weakness. He was not able to stand up from the floor and call his mom. Evaluation in the ED CT head showed low-attenuation changes in the right periventricular white matter concerning for acute infarct versus mass lesion. Systolic blood pressure markedly elevated more than 200.   1-Acute CVA, involve posterior right corona and anterior aspect of left external capsule. -MRI; acute subacute nonhemorrhagic infarct involving posterior right corona radiata  and anterior aspect of the left external capsule. Right-sided infarct likely impact corticospinal tracts involving left  motor function. Punctate cortical infarct along the inferior left frontal operculum.  -MRA; moderate to high-grade stenosis at the junction of the right V3 and V4 segment with segmental narrowing of the V4 segment above this level. Moderate proximal basilar artery stenosis. Atherosclerotic irregularity in the cavernous internal carotid arteries bilaterally with 50 to 60% stenosis of the supraclinoid right ICA. Moderate stenosis of the right posterior communicating artery with asymmetric attenuation of distal PCA branch vessel on the right. -on Lipitor, aspirin. Further anticoagulation recommendation per Neurology./  -Neurology  recommend TEE. TEE negative for thrombus. Possible small PFO, LE doppler negative for DVT>  -Hopefully inpatient rehab admission.  -Started  low dose BP medications./  -Carotid doppler no significant stenosis.  -Crania doppler per neurology negative   2-CKD stage IIIa; Prior cr 1.8 Presents with Cr :3.5--2.9 Continue with  IV fluids, Metabolic acidosis, started  sodium bicarb tablet.  Metabolic acidosis improved.  Continue to monitor urine out put.  Cr down to 2. 9  3-Hyperlipidemia;  LDL; 584. Started on lipitor.   4-Diabetes Type 2;  Hb-A1c; 9.7 On low dose lantus.  Needs to continue to hold metformin due to CKD  5-Hyperkalemia; Hemolysis on B-met. Lab error.    6-Leukocytosis; trending down.  Repeat Urine culture.  WBC trending down.   Hypokalemia; replete orally.  HTN; permissive HTN initially, due to acute stroke.  Now on  low dose norvasc.   Left Arm swelling; [place] negative for DVT   Discharge Diagnoses:  Principal Problem:   Acute CVA (cerebrovascular accident) ([place]) Active Problems:   Dyslipidemia associated with type 2 diabetes mellitus ([place])   CKD (chronic kidney disease) stage 3, GFR 30-59 ml/min   Hypertensive urgency   Type 2 diabetes mellitus with vascular disease ([place])    Discharge Instructions   Allergies as of 04/19/2020   No Known Allergies     Medication List    STOP taking these medications   lisinopril 20 MG tablet Commonly known as: ZESTRIL   metFORMIN 1000 MG tablet Commonly known as: GLUCOPHAGE     TAKE these medications   amLODipine 5 MG tablet Commonly known as: NORVASC Take 1 tablet (5 mg total) by mouth daily. Start taking on: April 20, 2020 What changed:   medication strength  how much to  take   aspirin 81 MG chewable tablet Chew 1 tablet (81 mg total) by mouth daily. Start taking on: April 20, 2020   atorvastatin 80 MG tablet Commonly known as: LIPITOR Take 1 tablet (80 mg total) by mouth at  bedtime.   glipiZIDE 5 MG tablet Commonly known as: GLUCOTROL TAKE 1 TABLET BY MOUTH TWICE DAILY BEFORE A MEAL   insulin glargine 100 UNIT/ML injection Commonly known as: LANTUS Inject 0.1 mLs (10 Units total) into the skin daily. Start taking on: April 20, 2020   omeprazole 40 MG capsule Commonly known as: PRILOSEC Take 1 capsule (40 mg total) by mouth daily.   ondansetron 4 MG disintegrating tablet Commonly known as: Zofran ODT Take 1 tablet (4 mg total) by mouth every 8 (eight) hours as needed for nausea or vomiting.   rosuvastatin 20 MG tablet Commonly known as: Crestor Take 1 tablet (20 mg total) by mouth daily.   sodium bicarbonate 650 MG tablet Take 2 tablets (1,300 mg total) by mouth 2 (two) times daily.       No Known Allergies  Consultations:  Neurology    Procedures/Studies: CT HEAD WO CONTRAST  Result Date: 04/16/2020 CLINICAL DATA:  Acute onset left-sided weakness for 1 day. Suspected stroke. EXAM: CT HEAD WITHOUT CONTRAST TECHNIQUE: Contiguous axial images were obtained from the base of the skull through the vertex without intravenous contrast. COMPARISON:  None. FINDINGS: Brain: Low-attenuation change in the right periventricular white matter suggesting acute infarct or possibly a mass lesion. Consider MRI for further evaluation. Old lacunar infarcts suggested in the left basal ganglia. No mass effect or midline shift. No abnormal extra-axial fluid collections. Gray-white matter junctions are distinct. Basal cisterns are not effaced. No acute intracranial hemorrhage. Vascular: No hyperdense vessel or unexpected calcification. Skull: Calvarium appears intact. Sinuses/Orbits: Paranasal sinuses are clear. Hypoaeration of the mastoid air cells with small mastoid effusions. Mucosal thickening in the right middle ear possibly representing inflammatory process or cholesteatoma. Other: None. IMPRESSION: 1. Low-attenuation change in the right periventricular white  matter suggesting acute infarct or possibly a mass lesion. Consider MRI for further evaluation. 2. Old lacunar infarcts in the left basal ganglia. 3. No acute intracranial hemorrhage. 4. Hypoaeration of the mastoid air cells with small mastoid effusions. Mucosal thickening in the right middle ear possibly representing inflammatory process or cholesteatoma. Electronically Signed   By: Lucienne Capers M.D.   On: 04/16/2020 01:02   MR ANGIO HEAD WO CONTRAST  Result Date: 04/16/2020 CLINICAL DATA:  New onset of left upper extremity weakness. Stroke symptoms have progressed significantly overnight. EXAM: MRI HEAD WITHOUT CONTRAST MRA HEAD WITHOUT CONTRAST MRA NECK WITHOUT CONTRAST TECHNIQUE: Multiplanar, multiecho pulse sequences of the brain and surrounding structures were obtained without intravenous contrast. Angiographic images of the Circle of Willis were obtained using MRA technique without intravenous contrast. Angiographic images of the neck were obtained using MRA technique without intravenous contrast. Carotid stenosis measurements (when applicable) are obtained utilizing NASCET criteria, using the distal internal carotid diameter as the denominator. COMPARISON:  CT head without contrast 04/16/2020 FINDINGS: MRI HEAD FINDINGS Brain: The diffusion-weighted images confirm acute/subacute nonhemorrhagic infarcts involving the posterior right corona radiata. White matter infarct in the anterior aspect of the left external capsule, just anterior to the lentiform nucleus measures up to 13 mm. Punctate cortical infarct is present along the inferior left frontal operculum. T2 signal changes are associated with each of these areas. More remote lacunar infarct is present at the left globus pallidus. The basal  ganglia are otherwise intact Periventricular and subcortical T2 signal changes are otherwise mildly advanced for age. The internal auditory canals are within normal limits. The brainstem and cerebellum are  within normal limits. Vascular: The distal left vertebral artery is not visualized. Flow is otherwise present in the major intracranial arteries. Skull and upper cervical spine: Craniocervical junction is normal. Flow is present in the vertebral arteries bilaterally. Visualized intracranial contents are normal. Sinuses/Orbits: Right greater than left mastoid effusion is present. The paranasal sinuses and mastoid air cells are otherwise clear. The globes and orbits are within normal limits. MRA HEAD FINDINGS Atherosclerotic irregularity is present in the cavernous internal carotid arteries bilaterally. 50-60% stenosis is present in the supraclinoid right ICA. No other significant stenoses are present through the ICA termini. The anterior communicating artery is patent. MCA bifurcations are intact. Asymmetric attenuation of distal left MCA branch vessels is noted without a significant proximal stenosis or occlusion. No flow signal is present in the left vertebral artery. Moderate to high-grade stenosis is present at the junction at the V3 and V4 segments with segmental narrowing of the V4 segment above this level. Moderate proximal basilar artery stenosis is present. Basilar artery is small, terminating at the superior cerebellar arteries. Both posterior cerebral arteries are of fetal type. Moderate stenoses are present in the right posterior communicating artery with asymmetric attenuation of distal PCA branch vessels on the right. MRA NECK FINDINGS Time of flight imaging demonstrates common origin of the left common carotid artery and innominate artery. No significant proximal stenoses are present. MCA bifurcations are intact without significant flow disturbance. Flow is antegrade in the vertebral arteries bilaterally. The right vertebral artery is the dominant vessel. No flow is present in the distal left vertebral artery above the craniocervical junction. IMPRESSION: 1. Acute/subacute nonhemorrhagic infarcts  involving the posterior right corona radiata and anterior aspect of the left external capsule, corresponding with patient's symptoms. Right-sided infarcts likely impacts cortical spinal tracts involving left motor function. 2. Punctate cortical infarct along the inferior left frontal operculum. 3. Periventricular and subcortical T2 signal changes are otherwise mildly advanced for age. This likely reflects the sequela of chronic microvascular ischemia. 4. No flow signal in the distal left vertebral artery above the craniocervical junction. 5. Moderate to high-grade stenosis at the junction at the right V3 and V4 segments with segmental narrowing of the V4 segment above this level. 6. Moderate proximal basilar artery stenosis. 7. Atherosclerotic irregularity in the cavernous internal carotid arteries bilaterally with a 50-60% stenosis in the supraclinoid right ICA. 8. Moderate stenoses of the right posterior communicating artery with asymmetric attenuation of distal PCA branch vessels on the right. The right posterior cerebral artery is of fetal type. 9. No significant proximal stenosis in the neck. Electronically Signed   By: San Morelle M.D.   On: 04/16/2020 12:19   MR ANGIO NECK WO CONTRAST  Result Date: 04/16/2020 CLINICAL DATA:  New onset of left upper extremity weakness. Stroke symptoms have progressed significantly overnight. EXAM: MRI HEAD WITHOUT CONTRAST MRA HEAD WITHOUT CONTRAST MRA NECK WITHOUT CONTRAST TECHNIQUE: Multiplanar, multiecho pulse sequences of the brain and surrounding structures were obtained without intravenous contrast. Angiographic images of the Circle of Willis were obtained using MRA technique without intravenous contrast. Angiographic images of the neck were obtained using MRA technique without intravenous contrast. Carotid stenosis measurements (when applicable) are obtained utilizing NASCET criteria, using the distal internal carotid diameter as the denominator.  COMPARISON:  CT head without contrast 04/16/2020 FINDINGS: MRI HEAD  FINDINGS Brain: The diffusion-weighted images confirm acute/subacute nonhemorrhagic infarcts involving the posterior right corona radiata. White matter infarct in the anterior aspect of the left external capsule, just anterior to the lentiform nucleus measures up to 13 mm. Punctate cortical infarct is present along the inferior left frontal operculum. T2 signal changes are associated with each of these areas. More remote lacunar infarct is present at the left globus pallidus. The basal ganglia are otherwise intact Periventricular and subcortical T2 signal changes are otherwise mildly advanced for age. The internal auditory canals are within normal limits. The brainstem and cerebellum are within normal limits. Vascular: The distal left vertebral artery is not visualized. Flow is otherwise present in the major intracranial arteries. Skull and upper cervical spine: Craniocervical junction is normal. Flow is present in the vertebral arteries bilaterally. Visualized intracranial contents are normal. Sinuses/Orbits: Right greater than left mastoid effusion is present. The paranasal sinuses and mastoid air cells are otherwise clear. The globes and orbits are within normal limits. MRA HEAD FINDINGS Atherosclerotic irregularity is present in the cavernous internal carotid arteries bilaterally. 50-60% stenosis is present in the supraclinoid right ICA. No other significant stenoses are present through the ICA termini. The anterior communicating artery is patent. MCA bifurcations are intact. Asymmetric attenuation of distal left MCA branch vessels is noted without a significant proximal stenosis or occlusion. No flow signal is present in the left vertebral artery. Moderate to high-grade stenosis is present at the junction at the V3 and V4 segments with segmental narrowing of the V4 segment above this level. Moderate proximal basilar artery stenosis is present.  Basilar artery is small, terminating at the superior cerebellar arteries. Both posterior cerebral arteries are of fetal type. Moderate stenoses are present in the right posterior communicating artery with asymmetric attenuation of distal PCA branch vessels on the right. MRA NECK FINDINGS Time of flight imaging demonstrates common origin of the left common carotid artery and innominate artery. No significant proximal stenoses are present. MCA bifurcations are intact without significant flow disturbance. Flow is antegrade in the vertebral arteries bilaterally. The right vertebral artery is the dominant vessel. No flow is present in the distal left vertebral artery above the craniocervical junction. IMPRESSION: 1. Acute/subacute nonhemorrhagic infarcts involving the posterior right corona radiata and anterior aspect of the left external capsule, corresponding with patient's symptoms. Right-sided infarcts likely impacts cortical spinal tracts involving left motor function. 2. Punctate cortical infarct along the inferior left frontal operculum. 3. Periventricular and subcortical T2 signal changes are otherwise mildly advanced for age. This likely reflects the sequela of chronic microvascular ischemia. 4. No flow signal in the distal left vertebral artery above the craniocervical junction. 5. Moderate to high-grade stenosis at the junction at the right V3 and V4 segments with segmental narrowing of the V4 segment above this level. 6. Moderate proximal basilar artery stenosis. 7. Atherosclerotic irregularity in the cavernous internal carotid arteries bilaterally with a 50-60% stenosis in the supraclinoid right ICA. 8. Moderate stenoses of the right posterior communicating artery with asymmetric attenuation of distal PCA branch vessels on the right. The right posterior cerebral artery is of fetal type. 9. No significant proximal stenosis in the neck. Electronically Signed   By: San Morelle M.D.   On: 04/16/2020  12:19   MR Brain Wo Contrast (neuro protocol)  Result Date: 04/16/2020 CLINICAL DATA:  New onset of left upper extremity weakness. Stroke symptoms have progressed significantly overnight. EXAM: MRI HEAD WITHOUT CONTRAST MRA HEAD WITHOUT CONTRAST MRA NECK WITHOUT CONTRAST TECHNIQUE:  Multiplanar, multiecho pulse sequences of the brain and surrounding structures were obtained without intravenous contrast. Angiographic images of the Circle of Willis were obtained using MRA technique without intravenous contrast. Angiographic images of the neck were obtained using MRA technique without intravenous contrast. Carotid stenosis measurements (when applicable) are obtained utilizing NASCET criteria, using the distal internal carotid diameter as the denominator. COMPARISON:  CT head without contrast 04/16/2020 FINDINGS: MRI HEAD FINDINGS Brain: The diffusion-weighted images confirm acute/subacute nonhemorrhagic infarcts involving the posterior right corona radiata. White matter infarct in the anterior aspect of the left external capsule, just anterior to the lentiform nucleus measures up to 13 mm. Punctate cortical infarct is present along the inferior left frontal operculum. T2 signal changes are associated with each of these areas. More remote lacunar infarct is present at the left globus pallidus. The basal ganglia are otherwise intact Periventricular and subcortical T2 signal changes are otherwise mildly advanced for age. The internal auditory canals are within normal limits. The brainstem and cerebellum are within normal limits. Vascular: The distal left vertebral artery is not visualized. Flow is otherwise present in the major intracranial arteries. Skull and upper cervical spine: Craniocervical junction is normal. Flow is present in the vertebral arteries bilaterally. Visualized intracranial contents are normal. Sinuses/Orbits: Right greater than left mastoid effusion is present. The paranasal sinuses and mastoid air  cells are otherwise clear. The globes and orbits are within normal limits. MRA HEAD FINDINGS Atherosclerotic irregularity is present in the cavernous internal carotid arteries bilaterally. 50-60% stenosis is present in the supraclinoid right ICA. No other significant stenoses are present through the ICA termini. The anterior communicating artery is patent. MCA bifurcations are intact. Asymmetric attenuation of distal left MCA branch vessels is noted without a significant proximal stenosis or occlusion. No flow signal is present in the left vertebral artery. Moderate to high-grade stenosis is present at the junction at the V3 and V4 segments with segmental narrowing of the V4 segment above this level. Moderate proximal basilar artery stenosis is present. Basilar artery is small, terminating at the superior cerebellar arteries. Both posterior cerebral arteries are of fetal type. Moderate stenoses are present in the right posterior communicating artery with asymmetric attenuation of distal PCA branch vessels on the right. MRA NECK FINDINGS Time of flight imaging demonstrates common origin of the left common carotid artery and innominate artery. No significant proximal stenoses are present. MCA bifurcations are intact without significant flow disturbance. Flow is antegrade in the vertebral arteries bilaterally. The right vertebral artery is the dominant vessel. No flow is present in the distal left vertebral artery above the craniocervical junction. IMPRESSION: 1. Acute/subacute nonhemorrhagic infarcts involving the posterior right corona radiata and anterior aspect of the left external capsule, corresponding with patient's symptoms. Right-sided infarcts likely impacts cortical spinal tracts involving left motor function. 2. Punctate cortical infarct along the inferior left frontal operculum. 3. Periventricular and subcortical T2 signal changes are otherwise mildly advanced for age. This likely reflects the sequela of  chronic microvascular ischemia. 4. No flow signal in the distal left vertebral artery above the craniocervical junction. 5. Moderate to high-grade stenosis at the junction at the right V3 and V4 segments with segmental narrowing of the V4 segment above this level. 6. Moderate proximal basilar artery stenosis. 7. Atherosclerotic irregularity in the cavernous internal carotid arteries bilaterally with a 50-60% stenosis in the supraclinoid right ICA. 8. Moderate stenoses of the right posterior communicating artery with asymmetric attenuation of distal PCA branch vessels on the right. The  right posterior cerebral artery is of fetal type. 9. No significant proximal stenosis in the neck. Electronically Signed   By: San Morelle M.D.   On: 04/16/2020 12:19   VAS [place] TRANSCRANIAL DOPPLER W BUBBLES  Result Date: 04/19/2020  Transcranial Doppler with Bubble Indications: Stroke. Performing Technologist: Abram Sander RVS  Examination Guidelines: A complete evaluation includes B-mode imaging, spectral Doppler, color Doppler, and power Doppler as needed of all accessible portions of each vessel. Bilateral testing is considered an integral part of a complete examination. Limited examinations for reoccurring indications may be performed as noted.  Summary: No HITS at rest or during Valsalva. Negative transcranial Doppler Bubble study with no evidence of right to left intracardiac communication.  A vascular evaluation was performed. The right middle cerebral artery was studied. An IV was inserted into the patient's right forearm . Verbal informed consent was obtained.  Negative TCD Bubble study *See table(s) above for TCD measurements and observations.  Diagnosing physician: Antony Contras MD Electronically signed by Antony Contras MD on 04/19/2020 at 12:33:39 PM.    Final    ECHO TEE  Result Date: 04/19/2020    TRANSESOPHOGEAL ECHO REPORT   Patient Name:   Tony Schmitt Date of Exam: 04/18/2020 Medical Rec #:  [id number]      Height:       64.0 in Accession #:    [id number]    Weight:       161.4 lb Date of Birth:  Dec 30, 1970     BSA:          1.786 m Patient Age:    49 years      BP:           124/69 mmHg Patient Gender: M             HR:           88 bpm. Exam Location:  Inpatient Procedure: Transesophageal Echo Indications:    stroke 434.9`  History:        Patient has prior history of Echocardiogram examinations, most                 recent 04/16/2020. Arrythmias:PVC; Risk Factors:Hypertension,                 Diabetes and Former Smoker.  Sonographer:    Jannett Celestine RDCS (AE) Referring Phys: [id number] ANGELA NICOLE DUKE PROCEDURE: The transesophogeal probe was passed without difficulty through the esophogus of the patient. Sedation performed by different physician. The patient's vital signs; including heart rate, blood pressure, and oxygen saturation; remained stable throughout the procedure. The patient developed no complications during the procedure. IMPRESSIONS  1. Left ventricular ejection fraction, by estimation, is 60 to 65%. The left ventricle has normal function. The left ventricle has no regional wall motion abnormalities.  2. Right ventricular systolic function is normal. The right ventricular size is normal.  3. No left atrial/left atrial appendage thrombus was detected.  4. The mitral valve is normal in structure. Trivial mitral valve regurgitation. No evidence of mitral stenosis.  5. The aortic valve is normal in structure. Aortic valve regurgitation is not visualized. No aortic stenosis is present.  6. The inferior vena cava is normal in size with greater than 50% respiratory variability, suggesting  7. Possible very small PFO noted with agitated saline contrast study with late bubbles after 7 cardiac cycles and Valsalva. Conclusion(s)/Recommendation(s): Normal biventricular function without evidence of hemodynamically significant valvular heart disease. FINDINGS  Left Ventricle: Left ventricular  ejection fraction, by  estimation, is 60 to 65%. The left ventricle has normal function. The left ventricle has no regional wall motion abnormalities. The left ventricular internal cavity size was normal in size. There is  no left ventricular hypertrophy. Right Ventricle: The right ventricular size is normal. No increase in right ventricular wall thickness. Right ventricular systolic function is normal. Left Atrium: Left atrial size was normal in size. No left atrial/left atrial appendage thrombus was detected. Right Atrium: Right atrial size was normal in size. Pericardium: There is no evidence of pericardial effusion. Mitral Valve: The mitral valve is normal in structure. Trivial mitral valve regurgitation. No evidence of mitral valve stenosis. Tricuspid Valve: The tricuspid valve is normal in structure. Tricuspid valve regurgitation is not demonstrated. No evidence of tricuspid stenosis. Aortic Valve: The aortic valve is normal in structure. Aortic valve regurgitation is not visualized. No aortic stenosis is present. Pulmonic Valve: The pulmonic valve was normal in structure. Pulmonic valve regurgitation is trivial. No evidence of pulmonic stenosis. Aorta: The aortic root is normal in size and structure. There is minimal (Grade I) layered plaque. Venous: The inferior vena cava is normal in size with greater than 50% respiratory variability, suggesting right atrial pressure of 3 mmHg. IAS/Shunts: Possible small shunt. Agitated saline contrast was given intravenously to evaluate for intracardiac shunting. Possible very small PFO noted with agitated saline contrast study with late bubbles after 7 cardiac cycles and Valsalva. [name] MD Electronically signed by [name] MD Signature Date/Time: 04/19/2020/12:47:22 PM    Final    ECHOCARDIOGRAM COMPLETE BUBBLE STUDY  Result Date: 04/16/2020    ECHOCARDIOGRAM REPORT   Patient Name:   Tony Schmitt Date of Exam: 04/16/2020 Medical Rec #:  [id number]     Height:       64.0 in  Accession #:    [id number]    Weight:       156.0 lb Date of Birth:  06/07/1971     BSA:          1.760 m Patient Age:    82 years      BP:           199/106 mmHg Patient Gender: M             HR:           99 bpm. Exam Location:  Inpatient Procedure: 2D Echo Indications:    434.91 stroke  History:        Patient has prior history of Echocardiogram examinations, most                 recent 04/18/2019. Risk Factors:Hypertension, Diabetes and Former                 Smoker.  Sonographer:    Jannett Celestine RDCS (AE) Referring Phys: [place]  1. Left ventricular ejection fraction, by estimation, is 60 to 65%. The left ventricle has normal function. The left ventricle has no regional wall motion abnormalities. There is mild concentric left ventricular hypertrophy. Left ventricular diastolic function could not be evaluated.  2. Right ventricular systolic function is normal. The right ventricular size is normal. Tricuspid regurgitation signal is inadequate for assessing PA pressure.  3. The mitral valve is normal in structure. No evidence of mitral valve regurgitation. No evidence of mitral stenosis.  4. The aortic valve is normal in structure. Aortic valve regurgitation is not visualized. No aortic stenosis is present.  5. The inferior vena  cava is normal in size with greater than 50% respiratory variability, suggesting right atrial pressure of 3 mmHg. FINDINGS  Left Ventricle: Left ventricular ejection fraction, by estimation, is 60 to 65%. The left ventricle has normal function. The left ventricle has no regional wall motion abnormalities. The left ventricular internal cavity size was normal in size. There is  mild concentric left ventricular hypertrophy. Left ventricular diastolic function could not be evaluated. Right Ventricle: The right ventricular size is normal. No increase in right ventricular wall thickness. Right ventricular systolic function is normal. Tricuspid regurgitation signal is  inadequate for assessing PA pressure. Left Atrium: Left atrial size was normal in size. Right Atrium: Right atrial size was normal in size. Pericardium: There is no evidence of pericardial effusion. Mitral Valve: The mitral valve is normal in structure. No evidence of mitral valve regurgitation. No evidence of mitral valve stenosis. Tricuspid Valve: The tricuspid valve is normal in structure. Tricuspid valve regurgitation is not demonstrated. No evidence of tricuspid stenosis. Aortic Valve: The aortic valve is normal in structure. Aortic valve regurgitation is not visualized. No aortic stenosis is present. Pulmonic Valve: The pulmonic valve was normal in structure. Pulmonic valve regurgitation is not visualized. No evidence of pulmonic stenosis. Aorta: The aortic root is normal in size and structure. Venous: The inferior vena cava is normal in size with greater than 50% respiratory variability, suggesting right atrial pressure of 3 mmHg. IAS/Shunts: No atrial level shunt detected by color flow Doppler.  LEFT VENTRICLE PLAX 2D LVIDd:         4.30 cm LVIDs:         2.40 cm LV PW:         1.20 cm LV IVS:        1.20 cm LVOT diam:     2.00 cm LV SV:         49 LV SV Index:   28 LVOT Area:     3.14 cm  RIGHT VENTRICLE RV S prime:     13.90 cm/s TAPSE (M-mode): 2.1 cm LEFT ATRIUM             Index LA diam:        2.90 cm 1.65 cm/m LA Vol (A2C):   26.8 ml 15.22 ml/m LA Vol (A4C):   49.3 ml 28.01 ml/m LA Biplane Vol: 36.5 ml 20.73 ml/m  AORTIC VALVE LVOT Vmax:   89.50 cm/s LVOT Vmean:  65.200 cm/s LVOT VTI:    0.157 m  AORTA Ao Root diam: 3.20 cm  SHUNTS Systemic VTI:  0.16 m Systemic Diam: 2.00 cm [name] MD Electronically signed by [name] MD Signature Date/Time: 04/16/2020/9:31:26 AM    Final    VAS US CAROTID (at [facility] and [facility] only)  Result Date: 04/19/2020 Carotid Arterial Duplex Study Indications:       CVA. Risk Factors:      Hypertension, Diabetes. Comparison Study:  no prior Performing Technologist:  Abram Sander RVS  Examination Guidelines: A complete evaluation includes B-mode imaging, spectral Doppler, color Doppler, and power Doppler as needed of all accessible portions of each vessel. Bilateral testing is considered an integral part of a complete examination. Limited examinations for reoccurring indications may be performed as noted.  Right Carotid Findings: +----------+--------+--------+--------+------------------+--------+             PSV cm/s EDV cm/s Stenosis Plaque Description Comments  +----------+--------+--------+--------+------------------+--------+  CCA Prox   100      19  heterogenous                 +----------+--------+--------+--------+------------------+--------+  CCA Distal 76       14                heterogenous                 +----------+--------+--------+--------+------------------+--------+  ICA Prox   66       28       1-39%    heterogenous       tortuous  +----------+--------+--------+--------+------------------+--------+  ICA Distal 59       23                                             +----------+--------+--------+--------+------------------+--------+  ECA        104      11                                             +----------+--------+--------+--------+------------------+--------+ +----------+--------+-------+--------+-------------------+             PSV cm/s EDV cms Describe Arm Pressure (mmHG)  +----------+--------+-------+--------+-------------------+  Subclavian 102                                            +----------+--------+-------+--------+-------------------+ +---------+--------+--+--------+--+---------+  Vertebral PSV cm/s 66 EDV cm/s 21 Antegrade  +---------+--------+--+--------+--+---------+  Left Carotid Findings: +----------+--------+--------+--------+------------------+--------+             PSV cm/s EDV cm/s Stenosis Plaque Description Comments  +----------+--------+--------+--------+------------------+--------+  CCA Prox   106      22                 heterogenous                 +----------+--------+--------+--------+------------------+--------+  CCA Distal 96       22                heterogenous                 +----------+--------+--------+--------+------------------+--------+  ICA Prox   75       25       1-39%    heterogenous       tortuous  +----------+--------+--------+--------+------------------+--------+  ICA Distal 97       37                                             +----------+--------+--------+--------+------------------+--------+  ECA        86       11                                             +----------+--------+--------+--------+------------------+--------+ +----------+--------+--------+--------+-------------------+             PSV cm/s EDV cm/s Describe Arm Pressure (mmHG)  +----------+--------+--------+--------+-------------------+  Subclavian 206                                             +----------+--------+--------+--------+-------------------+ +---------+--------+--+--------+--+---------+  Vertebral PSV cm/s 44 EDV cm/s 18 Antegrade  +---------+--------+--+--------+--+---------+   Summary: Right Carotid: Velocities in the right ICA are consistent with a 1-39% stenosis. Left Carotid: Velocities in the left ICA are consistent with a 1-39% stenosis. Vertebrals: Bilateral vertebral arteries demonstrate antegrade flow. *See table(s) above for measurements and observations.  Electronically signed by Antony Contras MD on 04/19/2020 at 12:33:14 PM.    Final    VAS [place] LOWER EXTREMITY VENOUS (DVT)  Result Date: 04/18/2020  Lower Venous DVTStudy Indications: Stroke.  Comparison Study: no prior Performing Technologist: Abram Sander RVS  Examination Guidelines: A complete evaluation includes B-mode imaging, spectral Doppler, color Doppler, and power Doppler as needed of all accessible portions of each vessel. Bilateral testing is considered an integral part of a complete examination. Limited examinations for reoccurring indications may be  performed as noted. The reflux portion of the exam is performed with the patient in reverse Trendelenburg.  +---------+---------------+---------+-----------+----------+--------------+  RIGHT     Compressibility Phasicity Spontaneity Properties Thrombus Aging  +---------+---------------+---------+-----------+----------+--------------+  CFV       Full            Yes       Yes                                    +---------+---------------+---------+-----------+----------+--------------+  SFJ       Full                                                             +---------+---------------+---------+-----------+----------+--------------+  FV Prox   Full                                                             +---------+---------------+---------+-----------+----------+--------------+  FV Mid    Full                                                             +---------+---------------+---------+-----------+----------+--------------+  FV Distal Full                                                             +---------+---------------+---------+-----------+----------+--------------+  PFV       Full                                                             +---------+---------------+---------+-----------+----------+--------------+  POP       Full            Yes  Yes                                    +---------+---------------+---------+-----------+----------+--------------+  PTV       Full                                                             +---------+---------------+---------+-----------+----------+--------------+  PERO      Full                                                             +---------+---------------+---------+-----------+----------+--------------+   +---------+---------------+---------+-----------+----------+--------------+  LEFT      Compressibility Phasicity Spontaneity Properties Thrombus Aging  +---------+---------------+---------+-----------+----------+--------------+  CFV       Full             Yes       Yes                                    +---------+---------------+---------+-----------+----------+--------------+  SFJ       Full                                                             +---------+---------------+---------+-----------+----------+--------------+  FV Prox   Full                                                             +---------+---------------+---------+-----------+----------+--------------+  FV Mid    Full                                                             +---------+---------------+---------+-----------+----------+--------------+  FV Distal Full                                                             +---------+---------------+---------+-----------+----------+--------------+  PFV       Full                                                             +---------+---------------+---------+-----------+----------+--------------+  POP       Full            Yes       Yes                                    +---------+---------------+---------+-----------+----------+--------------+  PTV       Full                                                             +---------+---------------+---------+-----------+----------+--------------+  PERO      Full                                                             +---------+---------------+---------+-----------+----------+--------------+     Summary: BILATERAL: - No evidence of deep vein thrombosis seen in the lower extremities, bilaterally. - No evidence of superficial venous thrombosis in the lower extremities, bilaterally. -   *See table(s) above for measurements and observations.    Preliminary    VAS [place] UPPER EXTREMITY VENOUS DUPLEX  Result Date: 04/18/2020 UPPER VENOUS STUDY  Indications: Swelling Comparison Study: no prior Performing Technologist: Abram Sander RVS  Examination Guidelines: A complete evaluation includes B-mode imaging, spectral Doppler, color Doppler, and power Doppler as needed of all accessible  portions of each vessel. Bilateral testing is considered an integral part of a complete examination. Limited examinations for reoccurring indications may be performed as noted.  Left Findings: +----------+------------+---------+-----------+----------+--------------+  LEFT       Compressible Phasicity Spontaneous Properties    Summary      +----------+------------+---------+-----------+----------+--------------+  IJV            Full        Yes        Yes                                +----------+------------+---------+-----------+----------+--------------+  Subclavian     Full        Yes        Yes                                +----------+------------+---------+-----------+----------+--------------+  Axillary       Full        Yes        Yes                                +----------+------------+---------+-----------+----------+--------------+  Brachial       Full        Yes        Yes                                +----------+------------+---------+-----------+----------+--------------+  Radial         Full                                                      +----------+------------+---------+-----------+----------+--------------+  Ulnar          Full                                                      +----------+------------+---------+-----------+----------+--------------+  Cephalic                                                 Not visualized  +----------+------------+---------+-----------+----------+--------------+  Basilic                                                  Not visualized  +----------+------------+---------+-----------+----------+--------------+  Summary:  Left: No evidence of deep vein thrombosis in the upper extremity.  *See table(s) above for measurements and observations.    Preliminary        Subjective: Alert, left side weakness same as yesterday.   Discharge Exam: Vitals:   04/19/20 1213 04/19/20 1546  BP: (!) 197/95 (!) 183/95  Pulse: 88 86  Resp: 20 18  Temp: 98.9  F (37.2 C) 99 F (37.2 C)  SpO2: 100% 98%     General: Pt is alert, awake, not in acute distress Cardiovascular: RRR, S1/S2 +, no rubs, no gallops Respiratory: CTA bilaterally, no wheezing, no rhonchi Abdominal: Soft, NT, ND, bowel sounds + Extremities: left side weakness 0/5    The results of significant diagnostics from this hospitalization (including imaging, microbiology, ancillary and laboratory) are listed below for reference.     Microbiology: Recent Results (from the past 240 hour(s))  SARS Coronavirus 2 by RT PCR (hospital order, performed in [facility] hospital lab) Nasopharyngeal Nasopharyngeal Swab     Status: None   Collection Time: 04/16/20  1:30 AM   Specimen: Nasopharyngeal Swab  Result Value Ref Range Status   SARS Coronavirus 2 NEGATIVE NEGATIVE Final    Comment: (NOTE) SARS-CoV-2 target nucleic acids are NOT DETECTED.  The SARS-CoV-2 RNA is generally detectable in upper and lower respiratory specimens during the acute phase of infection. The lowest concentration of SARS-CoV-2 viral copies this assay can detect is 250 copies / mL. A negative result does not preclude SARS-CoV-2 infection and should not be used as the sole basis for treatment or other patient management decisions.  A negative result may occur with improper specimen collection / handling, submission of specimen other than nasopharyngeal swab, presence of viral mutation(s) within the areas targeted by this assay, and inadequate number of viral copies (<250 copies / mL). A negative result must be combined with clinical observations, patient history, and epidemiological information.  Fact Sheet for Patients:   StrictlyIdeas.no  Fact Sheet for Healthcare Providers: BankingDealers.co.za  This test is not yet approved or  cleared by the [place] FDA and has been authorized for detection and/or diagnosis of SARS-CoV-2 by FDA under an  Emergency Use Authorization (EUA).  This EUA will remain in effect (meaning this test can be used) for the duration of the COVID-19 declaration under Section 564(b)(1) of the Act, 21 U.S.C. section 360bbb-3(b)(1), unless the authorization is terminated or revoked sooner.  Performed at [facility], [address] [place], [place] [address]   Urine Culture     Status: Abnormal   Collection Time: 04/17/20  7:24 AM   Specimen: Urine, Random  Result Value Ref Range Status   Specimen Description URINE, RANDOM  Final   Special Requests   Final    NONE Performed at [facility], [place] [address] [place], [place] [address]    Culture MULTIPLE SPECIES PRESENT, SUGGEST RECOLLECTION (A)  Final   Report Status 04/18/2020 FINAL  Final     Labs: BNP (last 3 results) No results for input(s): BNP in the last 8760 hours. Basic Metabolic Panel: Recent Labs  Lab 04/16/20 0558 04/17/20 0824 04/17/20 1450 04/18/20 0910 04/19/20 0843  NA 142 136 137 140 136  K 3.6 5.7* 3.3* 3.2* 3.4*  CL 108 110 108 112* 108  CO2 24 16* 22 20* 21*  GLUCOSE 159* 107* 132* 93 139*  BUN 31* 30* 29* 31* 32*  CREATININE 2.70* 3.08* 3.01* 3.06* 2.93*  CALCIUM 9.1 9.1 9.0 8.9 8.6*  MG  --  1.8  --   --   --    Liver Function Tests: Recent Labs  Lab 04/16/20 0045 04/16/20 0558  AST 21 20  ALT 18 16  ALKPHOS 82 88  BILITOT 0.6 0.6  PROT 5.4* 5.4*  ALBUMIN 1.9* 1.8*   No results for input(s): LIPASE, AMYLASE in the last 168 hours. No results for input(s): AMMONIA in the last 168 hours. CBC: Recent Labs  Lab 04/16/20 0044 04/16/20 0045 04/16/20 0558 04/17/20 0824 04/18/20 0910  WBC  --  12.0* 12.1* 10.8* 10.8*  NEUTROABS  --  9.5*  --   --   --   HGB 10.5* 10.7* 10.9* 10.6* 9.4*  HCT 31.0* 33.3* 34.5* 33.1* 29.5*  MCV  --  85.4 85.8 84.7 85.0  PLT  --  455* 459* 434* 426*   Cardiac Enzymes: No results for input(s): CKTOTAL, CKMB, CKMBINDEX, TROPONINI in the last 168  hours. BNP: Invalid input(s): POCBNP CBG: Recent Labs  Lab 04/18/20 1114 04/18/20 1559 04/18/20 2137 04/19/20 0621 04/19/20 1209  GLUCAP 82 75 226* 156* 162*   D-Dimer No results for input(s): DDIMER in the last 72 hours. Hgb A1c No results for input(s): HGBA1C in the last 72 hours. Lipid Profile No results for input(s): CHOL, HDL, LDLCALC, TRIG, CHOLHDL, LDLDIRECT in the last 72 hours. Thyroid function studies No results for input(s): TSH, T4TOTAL, T3FREE, THYROIDAB in the last 72 hours.  Invalid input(s): FREET3 Anemia work up No results for input(s): VITAMINB12, FOLATE, FERRITIN, TIBC, IRON, RETICCTPCT in the last 72 hours. Urinalysis    Component Value Date/Time   COLORURINE YELLOW 04/16/2020 0800   APPEARANCEUR CLEAR 04/16/2020 0800   LABSPEC 1.017 04/16/2020 0800   PHURINE 6.0 04/16/2020 0800   GLUCOSEU >=500 (A) 04/16/2020 0800   HGBUR MODERATE (A) 04/16/2020 0800   BILIRUBINUR NEGATIVE 04/16/2020 0800   KETONESUR 5 (A) 04/16/2020 0800   PROTEINUR >=300 (A) 04/16/2020 0800   NITRITE NEGATIVE 04/16/2020 0800   LEUKOCYTESUR NEGATIVE 04/16/2020 0800   Sepsis Labs Invalid input(s): PROCALCITONIN,  WBC,  LACTICIDVEN Microbiology Recent Results (from the past 240 hour(s))  SARS Coronavirus 2 by RT PCR (hospital order, performed in [place] hospital lab) Nasopharyngeal Nasopharyngeal Swab     Status: None   Collection Time: 04/16/20  1:30 AM   Specimen: Nasopharyngeal Swab  Result Value Ref Range Status   SARS Coronavirus 2 NEGATIVE NEGATIVE Final    Comment: (  NOTE) SARS-CoV-2 target nucleic acids are NOT DETECTED.  The SARS-CoV-2 RNA is generally detectable in upper and lower respiratory specimens during the acute phase of infection. The lowest concentration of SARS-CoV-2 viral copies this assay can detect is 250 copies / mL. A negative result does not preclude SARS-CoV-2 infection and should not be used as the sole basis for treatment or other patient  management decisions.  A negative result may occur with improper specimen collection / handling, submission of specimen other than nasopharyngeal swab, presence of viral mutation(s) within the areas targeted by this assay, and inadequate number of viral copies (<250 copies / mL). A negative result must be combined with clinical observations, patient history, and epidemiological information.  Fact Sheet for Patients:   StrictlyIdeas.no  Fact Sheet for Healthcare Providers: BankingDealers.co.za  This test is not yet approved or  cleared by the [place] FDA and has been authorized for detection and/or diagnosis of SARS-CoV-2 by FDA under an Emergency Use Authorization (EUA).  This EUA will remain in effect (meaning this test can be used) for the duration of the COVID-19 declaration under Section 564(b)(1) of the Act, 21 U.S.C. section 360bbb-3(b)(1), unless the authorization is terminated or revoked sooner.  Performed at [facility], [place] [address], [place], [place] [address]   Urine Culture     Status: Abnormal   Collection Time: 04/17/20  7:24 AM   Specimen: Urine, Random  Result Value Ref Range Status   Specimen Description URINE, RANDOM  Final   Special Requests   Final    NONE Performed at [facility], [place] [address], [place], [place] [address]    Culture MULTIPLE SPECIES PRESENT, SUGGEST RECOLLECTION (A)  Final   Report Status 04/18/2020 FINAL  Final     Time coordinating discharge: 40 minutes  SIGNED:   Elmarie Shiley, MD  [facility]

## 2020-04-19 NOTE — Progress Notes (Signed)
Physical Therapy Treatment Patient Details Name: Tony Schmitt MRN: [id number] DOB: 1970/11/30 Today's Date: 04/19/2020    History of Present Illness 49 y.o. male with history of HTN, DM, CKD stage III presents to the ER with left-sided weakness. CT head- acute right periventricular white matter infarct. MRI brain -acute to subacute nonhemorrhagic infarcts in the posterior right corona radiata and anterior aspect of left external capsule.    PT Comments    Patient progressing well towards PT goals. Tolerated gait training today with Mod A for balance, assist with LLE advancement, trunk control and left knee stability during stance phase. Able to initiate left hip flexion/progression towards end of gait trials with less assist. Noted to have impaired dynamic sitting balance without support. Tolerated multiple sit to stands emphasizing WB through LUE/LLE with transitions. Highly motivated. Continues to be a good CIR candidate. WIll follow.   Follow Up Recommendations  CIR     Equipment Recommendations  Other (comment) (defer)    Recommendations for Other Services       Precautions / Restrictions Precautions Precautions: Fall Precaution Comments: left hemiparesis Restrictions Weight Bearing Restrictions: No    Mobility  Bed Mobility Overal bed mobility: Needs Assistance Bed Mobility: Rolling;Sidelying to Sit Rolling: Min assist Sidelying to sit: Mod assist;HOB elevated       General bed mobility comments: Cues to reach for rail with RUE, assist with trunk. Able to bring LLE to EOB and scoot bottom.  Transfers Overall transfer level: Needs assistance Equipment used: None Transfers: Sit to/from [employer] Sit to Stand: Mod assist Stand pivot transfers: Mod assist       General transfer comment: Assist to power to standing with cues for foot/hand placement with emphasize on WB through LUE and forward momentum. Stood from [employer], from chair x2, SPT bed to  chair with Mod A and cues for technique.  Ambulation/Gait Ambulation/Gait assistance: Mod assist Gait Distance (Feet): 16 Feet (x2 bouts) Assistive device:  (rail in hallway) Gait Pattern/deviations: Step-to pattern;Step-through pattern;Decreased step length - right;Decreased stance time - left;Narrow base of support;Trunk flexed Gait velocity: decreased   General Gait Details: Slow, unsteady gait with narrow BoS, difficulty advancing LLE and support needed to stabilize left knee during stance. Cues for upright at trunk. Able to initiate LLE towards end of gait.   Stairs             Wheelchair Mobility    Modified Rankin (Stroke Patients Only) Modified Rankin (Stroke Patients Only) Pre-Morbid Rankin Score: No symptoms Modified Rankin: Moderately severe disability     Balance Overall balance assessment: Needs assistance Sitting-balance support: Feet supported;Single extremity supported Sitting balance-Leahy Scale: Poor Sitting balance - Comments: LOB x3 towards right and posteriorly with dynamic movements or without support, needs Min-Mod A to regain balance.   Standing balance support: During functional activity Standing balance-Leahy Scale: Poor Standing balance comment: Able to stand statically with UE support and cues but needs external support for walking                            Cognition Arousal/Alertness: Awake/alert Behavior During Therapy: Flat affect Overall Cognitive Status: No family/caregiver present to determine baseline cognitive functioning                                 General Comments: Requires repetition of cues to perform tasks. A&Ox4.  Exercises      General Comments General comments (skin integrity, edema, etc.): Left hand edematous, elevated on pillows.      Pertinent Vitals/Pain Pain Assessment: No/denies pain    Home Living                      Prior Function            PT Goals  (current goals can now be found in the care plan section) Progress towards PT goals: Progressing toward goals    Frequency    Min 4X/week      PT Plan Current plan remains appropriate    Co-evaluation              AM-PAC PT "6 Clicks" Mobility   Outcome Measure  Help needed turning from your back to your side while in a flat bed without using bedrails?: A Little Help needed moving from lying on your back to sitting on the side of a flat bed without using bedrails?: A Lot Help needed moving to and from a bed to a chair (including a wheelchair)?: A Lot Help needed standing up from a chair using your arms (e.g., wheelchair or bedside chair)?: A Lot Help needed to walk in hospital room?: A Lot Help needed climbing 3-5 steps with a railing? : Total 6 Click Score: 12    End of Session Equipment Utilized During Treatment: Gait belt Activity Tolerance: Patient tolerated treatment well Patient left: in chair;with call bell/phone within reach;with chair alarm set Nurse Communication: Mobility status PT Visit Diagnosis: Other abnormalities of gait and mobility (R26.89);Hemiplegia and hemiparesis Hemiplegia - Right/Left: Left Hemiplegia - dominant/non-dominant: Non-dominant Hemiplegia - caused by: Cerebral infarction     Time: 6063-0160 PT Time Calculation (min) (ACUTE ONLY): 20 min  Charges:  $Gait Training: 8-22 mins                     Marisa Severin, PT, DPT Acute Rehabilitation Services Pager [phone] Office [place] 04/19/2020, 9:39 AM

## 2020-04-19 NOTE — H&P (Addendum)
Physical Medicine and Rehabilitation Admission H&P    Chief Complaint  Patient presents with  . Weakness  : HPI: Tony Schmitt is a 49 year old right-handed male with history of diabetes mellitus, tobacco abuse, CKD stage III with creatinine baseline 2.42 08/02/2018, hypertension as well as medical noncompliance.  History taken from chart review and patient.  Patient lives with his mother.  1 level home 2 steps to entry.  Independent prior to admission.  He presented on 04/15/2020 with left hemiparesis.  Blood pressure noted to be 176/109 CT/MRI as well as MRA of the head and neck showed acute subacute nonhemorrhagic posterior right corona radiata and anterior aspect of left external capsule infarct.  Punctate cortical infarct along the inferior left frontal operculum.  No flow signal in the distal left vertebral artery above the craniocervical junction.  Atherosclerotic irregularity of the cavernous internal carotid arteries bilaterally with 50-60% stenosis of the supraclinoid right ICA.  Carotid Dopplers with 1 to 39% ICA stenosis.  Patient did not receive TPA.  Admission chemistries alcohol negative, urine drug screen negative, creatinine 3.35, glucose 227, BUN 31, hemoglobin A1c 9.6.  Echocardiogram with ejection fraction of 60-65%.  No wall motion abnormalities.  Currently maintained on aspirin for CVA prophylaxis.  Subcutaneous Lovenox for DVT prophylaxis with lower extremity Dopplers negative for DVT.  TEE showed ejection fraction 65%, no evidence of thrombus possible very small PFO.  TCD study was completed negative for PFO.  No plans for loop recorder anymore.  Monitoring of renal function creatinine 3.10 maintained on IV fluids initially.  Tolerating a regular diet.  Therapy evaluations completed and patient was admitted for a comprehensive rehab program.  Please see preadmission assessment from earlier today as well.  Review of Systems  Constitutional: Positive for malaise/fatigue.  Negative for chills and fever.  HENT: Negative for hearing loss.   Eyes: Negative for blurred vision and double vision.  Respiratory: Negative for cough and shortness of breath.   Cardiovascular: Negative for chest pain, palpitations and leg swelling.  Gastrointestinal: Positive for constipation. Negative for heartburn, nausea and vomiting.  Genitourinary: Negative for dysuria, flank pain and hematuria.  Musculoskeletal: Positive for myalgias.  Skin: Negative for rash.  Neurological: Positive for speech change, focal weakness and weakness. Negative for sensory change.  All other systems reviewed and are negative.  Past Medical History:  Diagnosis Date  . Diabetes mellitus without complication ([place])   . Hypertension    Past Surgical History:  Procedure Laterality Date  . BUBBLE STUDY  04/18/2020   Procedure: BUBBLE STUDY;  Surgeon: Sueanne Margarita, MD;  Location: [place];  Service: Cardiovascular;;  . TEE WITHOUT CARDIOVERSION N/A 04/18/2020   Procedure: TRANSESOPHAGEAL ECHOCARDIOGRAM (TEE);  Surgeon: Sueanne Margarita, MD;  Location: [facility] ENDOSCOPY;  Service: Cardiovascular;  Laterality: N/A;   Family History  Problem Relation Age of Onset  . Diabetes Mother   . Diabetes Father   . Hypertension Father   . Cancer Father   . Heart failure Other    Social History:  reports that he has quit smoking. His smoking use included cigarettes and cigars. He has never used smokeless tobacco. He reports previous alcohol use. He reports that he does not use drugs. Allergies: No Known Allergies Medications Prior to Admission  Medication Sig Dispense Refill  . glipiZIDE (GLUCOTROL) 5 MG tablet TAKE 1 TABLET BY MOUTH TWICE DAILY BEFORE A MEAL 60 tablet 0  . lisinopril (ZESTRIL) 20 MG tablet Take 1 tablet (20 mg total)  by mouth daily. 90 tablet 0  . amLODipine (NORVASC) 10 MG tablet Take 1 tablet (10 mg total) by mouth daily. 30 tablet 3  . metFORMIN (GLUCOPHAGE) 1000 MG tablet Take 1 tablet  (1,000 mg total) by mouth 2 (two) times daily with a meal. 180 tablet 3  . omeprazole (PRILOSEC) 40 MG capsule Take 1 capsule (40 mg total) by mouth daily. 30 capsule 3  . ondansetron (ZOFRAN ODT) 4 MG disintegrating tablet Take 1 tablet (4 mg total) by mouth every 8 (eight) hours as needed for nausea or vomiting. (Patient not taking: Reported on 08/19/2019) 20 tablet 0  . rosuvastatin (CRESTOR) 20 MG tablet Take 1 tablet (20 mg total) by mouth daily. 90 tablet 3    Drug Regimen Review Drug regimen was reviewed and remains appropriate with no significant issues identified  Home: Home Living Family/patient expects to be discharged to:: Private residence Living Arrangements: Parent Available Help at Discharge: Available 24 hours/day Type of Home: House Home Access: Stairs to enter [employer] of Steps: 2 Entrance Stairs-Rails: Right Home Layout: One level Bathroom Shower/Tub: Tub/shower unit, Architectural technologist: Standard Bathroom Accessibility: Yes Home Equipment: Environmental consultant - 2 wheels Additional Comments: tub/shower  Lives With: Family   Functional History: Prior Function Level of Independence: Independent  Functional Status:  Mobility: Bed Mobility Overal bed mobility: Needs Assistance Bed Mobility: Supine to Sit Rolling: Min assist Sidelying to sit: Mod assist (flat bed) Supine to sit: Mod assist, HOB elevated Sit to supine: Mod assist General bed mobility comments: pt allowed to choose his technique for coming to sit, he advanced RLE over EOB, requested assist with his LLE, required cues to bring his LUE across his body (and to grab UE by his forearm), cues and assist to push up towards sitting with RUE Transfers Overall transfer level: Needs assistance Equipment used: Rolling walker (2 wheeled), None Transfers: Sit to/from Stand Sit to Stand: Mod assist, Min assist (mod initially, progressed to min with reps) Stand pivot transfers: Mod assist General  transfer comment: cues for hand placement each transfer; poor mechanics initially, improved with repetition to need min assist Ambulation/Gait Ambulation/Gait assistance: Mod assist Gait Distance (Feet): 15 Feet Assistive device: Rolling walker (2 wheeled) Gait Pattern/deviations: Step-to pattern, Decreased step length - left, Decreased stance time - left, Decreased stride length, Decreased dorsiflexion - left, Decreased weight shift to left General Gait Details: Patient advancing LLE via use of adductors with LLE in external rotation and "sliding" foot along the floor then pressing left knee into hyperextension for weight-bearing and advancing RLE; facilitated to advance LLE with knee flexion, not lock into hyperextension (he does not buckle), and prevent left hip retraction Gait velocity: decreased Gait velocity interpretation: <1.31 ft/sec, indicative of household ambulator    ADL: ADL Overall ADL's : Needs assistance/impaired Eating/Feeding: Set up Grooming: Oral care, Wash/dry face, Wash/dry hands, Minimal assistance, Sitting Grooming Details (indicate cue type and reason): Pt required cues for problem solving and incorporating RUE in care for LUE Upper Body Bathing: Minimal assistance, Set up, Sitting Lower Body Bathing: Minimal assistance, Set up, Sit to/from stand Upper Body Dressing : Moderate assistance, Set up, Sitting Lower Body Dressing: Moderate assistance, Sit to/from stand Lower Body Dressing Details (indicate cue type and reason): Unable to donn socks at edge of stretcher. Performed pseudo pant activity and needed min assist. Toilet Transfer: Minimal assistance, Stand-pivot, BSC Toileting- Clothing Manipulation and Hygiene: Minimal assistance Toileting - Clothing Manipulation Details (indicate cue type and reason): Reports wiping himself,  partial assistance needed for clothing management  Cognition: Cognition Overall Cognitive Status: Impaired/Different from  baseline Arousal/Alertness: Awake/alert Orientation Level: Oriented X4 Cognition Arousal/Alertness: Awake/alert Behavior During Therapy: Flat affect Overall Cognitive Status: Impaired/Different from baseline Area of Impairment: Awareness, Problem solving Memory: Decreased short-term memory Safety/Judgement: Decreased awareness of safety, Decreased awareness of deficits Awareness: Emergent Problem Solving: Requires verbal cues, Requires tactile cues General Comments: had difficulty problem-solving which LE to advance first (after cued several times by PT); could not problem-solve best place to put chair (L vs R)  for safe transfer  Physical Exam: Blood pressure (!) 171/101, pulse 89, temperature 98.6 F (37 C), temperature source Oral, resp. rate 18, height 5\' 4"  (1.626 m), weight 73.2 kg, SpO2 99 %. Physical Exam Vitals reviewed.  Constitutional:      Appearance: Normal appearance.  HENT:     Head: Normocephalic and atraumatic.     Right Ear: External ear normal.     Left Ear: External ear normal.     Nose: Nose normal.  Eyes:     General:        Right eye: No discharge.        Left eye: No discharge.     Extraocular Movements: Extraocular movements intact.  Cardiovascular:     Rate and Rhythm: Normal rate and regular rhythm.  Pulmonary:     Effort: Pulmonary effort is normal. No respiratory distress.     Breath sounds: Normal breath sounds. No stridor.  Abdominal:     General: Abdomen is flat. Bowel sounds are normal. There is no distension.  Musculoskeletal:     Cervical back: Normal range of motion.     Comments: Left upper extremity edema, no tenderness.  Skin:    General: Skin is warm and dry.  Neurological:     Mental Status: He is alert.     Comments: Alert and oriented x3 Makes eye contact with examiner.   Follows commands.   Fair awareness of deficits. Motor: RUE/RLE: 5/5 proximal distally LUE: 0/5 proximal distal LLE: Hip flexion, knee extension 1+/5,  ankle dorsiflexion 0/5 Sensation intact light touch  Psychiatric:        Speech: Speech is slurred.        Behavior: Behavior is cooperative.     Results for orders placed or performed during the hospital encounter of 04/15/20 (from the past 48 hour(s))  Glucose, capillary     Status: Abnormal   Collection Time: 04/20/20 12:10 PM  Result Value Ref Range   Glucose-Capillary 136 (H) 70 - 99 mg/dL    Comment: Glucose reference range applies only to samples taken after fasting for at least 8 hours.  Glucose, capillary     Status: Abnormal   Collection Time: 04/20/20  4:04 PM  Result Value Ref Range   Glucose-Capillary 116 (H) 70 - 99 mg/dL    Comment: Glucose reference range applies only to samples taken after fasting for at least 8 hours.   Comment 1 Notify RN    Comment 2 Document in Chart   Glucose, capillary     Status: Abnormal   Collection Time: 04/20/20  9:10 PM  Result Value Ref Range   Glucose-Capillary 147 (H) 70 - 99 mg/dL    Comment: Glucose reference range applies only to samples taken after fasting for at least 8 hours.   Comment 1 Notify RN    Comment 2 Document in Chart   Glucose, capillary     Status: Abnormal   Collection Time:  04/21/20  6:11 AM  Result Value Ref Range   Glucose-Capillary 142 (H) 70 - 99 mg/dL    Comment: Glucose reference range applies only to samples taken after fasting for at least 8 hours.  Basic metabolic panel     Status: Abnormal   Collection Time: 04/21/20  9:50 AM  Result Value Ref Range   Sodium 137 135 - 145 mmol/L   Potassium 3.7 3.5 - 5.1 mmol/L   Chloride 109 98 - 111 mmol/L   CO2 21 (L) 22 - 32 mmol/L   Glucose, Bld 210 (H) 70 - 99 mg/dL    Comment: Glucose reference range applies only to samples taken after fasting for at least 8 hours.   BUN 30 (H) 6 - 20 mg/dL   Creatinine, Ser 2.92 (H) 0.61 - 1.24 mg/dL   Calcium 8.7 (L) 8.9 - 10.3 mg/dL   GFR calc non Af Amer 24 (L) >60 mL/min   GFR calc Af Amer 28 (L) >60 mL/min    Anion gap 7 5 - 15    Comment: Performed at [name] [address], [place], [place] [address]  CBC     Status: Abnormal   Collection Time: 04/21/20  9:50 AM  Result Value Ref Range   WBC 9.2 4.0 - 10.5 K/uL   RBC 3.56 (L) 4.22 - 5.81 MIL/uL   Hemoglobin 9.6 (L) 13.0 - 17.0 g/dL   HCT 30.0 (L) 39 - 52 %   MCV 84.3 80.0 - 100.0 fL   MCH 27.0 26.0 - 34.0 pg   MCHC 32.0 30.0 - 36.0 g/dL   RDW 13.9 11.5 - 15.5 %   Platelets 386 150 - 400 K/uL   nRBC 0.0 0.0 - 0.2 %    Comment: Performed at [facility], [place] [address] [place],  [address]  Glucose, capillary     Status: Abnormal   Collection Time: 04/21/20 12:21 PM  Result Value Ref Range   Glucose-Capillary 225 (H) 70 - 99 mg/dL    Comment: Glucose reference range applies only to samples taken after fasting for at least 8 hours.   Comment 1 Notify RN    Comment 2 Document in Chart   Glucose, capillary     Status: Abnormal   Collection Time: 04/21/20  5:44 PM  Result Value Ref Range   Glucose-Capillary 143 (H) 70 - 99 mg/dL    Comment: Glucose reference range applies only to samples taken after fasting for at least 8 hours.   Comment 1 Notify RN    Comment 2 Document in Chart   Glucose, capillary     Status: Abnormal   Collection Time: 04/21/20  9:02 PM  Result Value Ref Range   Glucose-Capillary 187 (H) 70 - 99 mg/dL    Comment: Glucose reference range applies only to samples taken after fasting for at least 8 hours.  Basic metabolic panel     Status: Abnormal   Collection Time: 04/22/20  1:36 AM  Result Value Ref Range   Sodium 138 135 - 145 mmol/L   Potassium 3.7 3.5 - 5.1 mmol/L   Chloride 109 98 - 111 mmol/L   CO2 23 22 - 32 mmol/L   Glucose, Bld 156 (H) 70 - 99 mg/dL    Comment: Glucose reference range applies only to samples taken after fasting for at least 8 hours.   BUN 32 (H) 6 - 20 mg/dL   Creatinine, Ser 3.10 (H) 0.61 - 1.24 mg/dL  Calcium 8.9 8.9 - 10.3 mg/dL   GFR calc non Af Amer 22  (L) >60 mL/min   GFR calc Af Amer 26 (L) >60 mL/min   Anion gap 6 5 - 15    Comment: Performed at [facility] [address] [place], [place] [address]  CBC     Status: Abnormal   Collection Time: 04/22/20  1:36 AM  Result Value Ref Range   WBC 10.3 4.0 - 10.5 K/uL   RBC 3.45 (L) 4.22 - 5.81 MIL/uL   Hemoglobin 9.3 (L) 13.0 - 17.0 g/dL   HCT 29.1 (L) 39 - 52 %   MCV 84.3 80.0 - 100.0 fL   MCH 27.0 26.0 - 34.0 pg   MCHC 32.0 30.0 - 36.0 g/dL   RDW 13.8 11.5 - 15.5 %   Platelets 393 150 - 400 K/uL   nRBC 0.0 0.0 - 0.2 %    Comment: Performed at [facility], [place] [address], [place], [place] [address]  Glucose, capillary     Status: Abnormal   Collection Time: 04/22/20  6:14 AM  Result Value Ref Range   Glucose-Capillary 112 (H) 70 - 99 mg/dL    Comment: Glucose reference range applies only to samples taken after fasting for at least 8 hours.  Glucose, capillary     Status: Abnormal   Collection Time: 04/22/20 11:26 AM  Result Value Ref Range   Glucose-Capillary 118 (H) 70 - 99 mg/dL    Comment: Glucose reference range applies only to samples taken after fasting for at least 8 hours.   No results found.     Medical Problem List and Plan: 1.  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   -patient may shower  -ELOS/Goals: 14-18 days/Supervision/Min A  Admit to CIR 2.  Antithrombotics: -DVT/anticoagulation: Lovenox  -antiplatelet therapy: Aspirin 81 mg daily 3. Pain Management: Tylenol as needed 4. Mood: Provide emotional support  -antipsychotic agents: N/A 5. Neuropsych: This patient is capable of making decisions on his own behalf. 6. Skin/Wound Care: Routine skin checks 7. Fluids/Electrolytes/Nutrition: Routine in and outs.  CMP ordered. 8.  Hypertension.  Norvasc 10 mg daily, Lopressor 12.5 mg twice daily  Monitor with increased mobility. 9.   Diabetes mellitus.  Hemoglobin A1c 9.6.  Lantus insulin 10 units daily.  Check blood sugars before meals and at bedtime.  Diabetic teaching  Moderate increased mobility. 10.  CKD stage III.  Creatinine baseline 2.42 08/02/2018.  Renal ultrasound 04/17/2019 showed no hydronephrosis or shadowing stone.  Started on sodium bicarbonate 1300 mg twice daily 04/17/2020.    CMP ordered for tomorrow a.m. 11.  Tobacco abuse.  Counsel 12.  Hyperlipidemia: Lipitor  Cathlyn Parsons, PA-C 04/22/2020  I have personally performed a face to face diagnostic evaluation, including, but not limited to relevant history and physical exam findings, of this patient and developed relevant assessment and plan.  Additionally, I have reviewed and concur with the physician assistant's documentation above.  Delice Lesch, MD, ABPMR

## 2020-04-20 LAB — CARDIOLIPIN ANTIBODIES, IGG, IGM, IGA
Anticardiolipin IgA: <9 APL U/mL (ref 0–11)
Anticardiolipin IgG: <9 GPL U/mL (ref 0–14)
Anticardiolipin IgM: 9 MPL U/mL (ref 0–12)

## 2020-04-20 LAB — GLUCOSE, CAPILLARY
Glucose-Capillary: 127 mg/dL — ABNORMAL HIGH (ref 70–99)
Glucose-Capillary: 136 mg/dL — ABNORMAL HIGH (ref 70–99)
Glucose-Capillary: 147 mg/dL — ABNORMAL HIGH (ref 70–99)

## 2020-04-20 MED ORDER — AMLODIPINE BESYLATE 10 MG PO TABS
10.0000 mg | ORAL_TABLET | Freq: Every day | ORAL | Status: DC
  Administered 2020-04-20 – 2020-04-22 (×3): 10 mg via ORAL
  Filled 2020-04-20 (×3): qty 1

## 2020-04-20 MED ORDER — POLYETHYLENE GLYCOL 3350 17 G PO PACK
17.0000 g | PACK | Freq: Every day | ORAL | Status: DC
  Administered 2020-04-20 – 2020-04-22 (×3): 17 g via ORAL
  Filled 2020-04-20 (×3): qty 1

## 2020-04-20 NOTE — Progress Notes (Signed)
Inpatient Rehabilitation Admissions Coordinator  CIR bed is not available for this patient today.  Danne Baxter, RN, MSN Rehab Admissions Coordinator [phone] 04/20/2020 11:25 AM

## 2020-04-20 NOTE — Progress Notes (Signed)
Occupational Therapy Treatment Patient Details Name: Tony Schmitt MRN: [id number] DOB: [birth year] Today's Date: 04/20/2020    History of present illness 49 y.o. male with history of HTN, DM, CKD stage III presents to the ER with left-sided weakness. CT head- acute right periventricular white matter infarct. MRI brain -acute to subacute nonhemorrhagic infarcts in the posterior right corona radiata and anterior aspect of left external capsule.   OT comments  Pt progressing towards OT goals this session. Pt in chair and session focused on HEP for LUE. LUE edemous, sensation intact, but movement limited to 2/5 at shoulder. HEP established for LUE with both therapist performing exercises, and then Pt performing HEP using RUE to facilitate movement PROM. Educated Pt in elevation of LUE to assist with edema as well in addition to HEP. Pt required multimodal cues during HEP and unsure of baseline cognition but will benefit from WRITTEN HEP next session. Pt continues to benefit from skilled OT in the acute setting and feel that CIR level therapy is essential to maximize safety and independence in ADL and functional transfers.   Might want to consider sling for mobility for LUE next session.    Follow Up Recommendations  CIR    Equipment Recommendations  3 in 1 bedside commode;Other (comment) (defer to next venue of care)    Recommendations for Other Services      Precautions / Restrictions Precautions Precautions: Fall Precaution Comments: left hemiparesis Restrictions Weight Bearing Restrictions: No       Mobility Bed Mobility               General bed mobility comments: OOB in chair when OT entered the room  Transfers                 General transfer comment: NT this session, focus on LUE HEP and functional grooming in sitting    Balance       Sitting balance - Comments: supported sitting in recliner for functional ADL tasks and established HEP for LUE                                    ADL either performed or assessed with clinical judgement   ADL Overall ADL's : Needs assistance/impaired     Grooming: Oral care;Wash/dry face;Wash/dry hands;Minimal assistance;Sitting Grooming Details (indicate cue type and reason): Pt required cues for problem solving and incorporating RUE in care for LUE                                     Vision       Perception     Praxis      Cognition Arousal/Alertness: Awake/alert Behavior During Therapy: Flat affect Overall Cognitive Status: Impaired/Different from baseline Area of Impairment: Memory;Safety/judgement;Awareness;Problem solving                     Memory: Decreased short-term memory   Safety/Judgement: Decreased awareness of safety Awareness: Emergent Problem Solving: Requires verbal cues General Comments: multimodal cues for RUE assisting LUE with HEP        Exercises Exercises: Shoulder Shoulder Exercises Shoulder Flexion: AAROM;Left;10 reps;Seated (10 by therapist hand over hand, and 10 by patient PROM) Shoulder ABduction: AAROM;Left;10 reps;Seated (10 by therapist hand over hand, and 10 by patient PROM) Shoulder External Rotation: AAROM;Left;10 reps;Seated (10 by therapist hand  over hand, and 10 by patient PROM) Elbow Flexion: AAROM;Left;10 reps;Seated (10 by therapist hand over hand, and 10 by patient PROM) Elbow Extension: AAROM;Left;10 reps;Seated (10 by therapist hand over hand, and 10 by patient PROM) Wrist Flexion: AAROM;Left;10 reps;Seated (10 by therapist hand over hand, and 10 by patient PROM) Wrist Extension: AAROM;Left;10 reps;Seated (10 by therapist hand over hand, and 10 by patient PROM) Digit Composite Flexion: AAROM;Left;10 reps;Seated (10 by therapist hand over hand, and 10 by patient PROM) Composite Extension: AAROM;Left;10 reps;Seated (10 by therapist hand over hand, and 10 by patient PROM) Other Exercises Other Exercises: educated  Pt on elevatation as a way to help with edema, IN ADDITION TO RUE assisting with ROM/HEP   Shoulder Instructions       General Comments      Pertinent Vitals/ Pain       Pain Assessment: No/denies pain  Home Living                                          Prior Functioning/Environment              Frequency  Min 2X/week        Progress Toward Goals  OT Goals(current goals can now be found in the care plan section)  Progress towards OT goals: Progressing toward goals (decrease in function of LUE since previous session)  Acute Rehab OT Goals Patient Stated Goal: To improve strength OT Goal Formulation: With patient Time For Goal Achievement: 04/30/20 Potential to Achieve Goals: Good  Plan Discharge plan remains appropriate;Frequency remains appropriate    Co-evaluation                 AM-PAC OT "6 Clicks" Daily Activity     Outcome Measure   Help from another person eating meals?: A Little Help from another person taking care of personal grooming?: A Little Help from another person toileting, which includes using toliet, bedpan, or urinal?: A Lot Help from another person bathing (including washing, rinsing, drying)?: A Lot Help from another person to put on and taking off regular upper body clothing?: A Lot Help from another person to put on and taking off regular lower body clothing?: A Lot 6 Click Score: 14    End of Session    OT Visit Diagnosis: Unsteadiness on feet (R26.81);Hemiplegia and hemiparesis Hemiplegia - Right/Left: Left Hemiplegia - dominant/non-dominant: Non-Dominant Hemiplegia - caused by: Cerebral infarction   Activity Tolerance Patient tolerated treatment well   Patient Left in chair;with call bell/phone within reach;with chair alarm set   Nurse Communication          Time: 0938-1829 OT Time Calculation (min): 23 min  Charges: OT General Charges $OT Visit: 1 Visit OT Treatments $Self Care/Home  Management : 8-22 mins $Therapeutic Exercise: 8-22 mins  Jesse Sans OTR/L Acute Rehabilitation Services Pager: [phone] Office: [place] 04/20/2020, 11:06 AM

## 2020-04-20 NOTE — Progress Notes (Signed)
Physical Therapy Treatment Patient Details Name: Tony Schmitt MRN: [id number] DOB: Jul 24, 1971 Today's Date: 04/20/2020    History of Present Illness 49 y.o. male with history of HTN, DM, CKD stage III presents to the ER with left-sided weakness. CT head- acute right periventricular white matter infarct. MRI brain -acute to subacute nonhemorrhagic infarcts in the posterior right corona radiata and anterior aspect of left external capsule.    PT Comments    Pt eager to get OOB and work on gait.  Emphasis on transition to EOB including work on symmetrical scooting, sit to stand, pre-gait and gait training with the RW, facilitating improved toe off and swing through.    Follow Up Recommendations  CIR     Equipment Recommendations  Other (comment) (TBD)    Recommendations for Other Services       Precautions / Restrictions Precautions Precautions: Fall Precaution Comments: left hemiparesis    Mobility  Bed Mobility Overal bed mobility: Needs Assistance Bed Mobility: Rolling;Sidelying to Sit Rolling: Min assist Sidelying to sit: Mod assist (flat bed)   Sit to supine: Mod assist   General bed mobility comments: truncal assist to roll and stability assist while pt coming up with R UE.  Cues for direction/sequencing  Transfers Overall transfer level: Needs assistance Equipment used: Rolling walker (2 wheeled) Transfers: Sit to/from Stand Sit to Stand: Min assist;Mod assist (mod once more fatigued)         General transfer comment: cues for hand placement  Ambulation/Gait Ambulation/Gait assistance: Mod assist Gait Distance (Feet): 40 Feet (x2) Assistive device: Rolling walker (2 wheeled) Gait Pattern/deviations: Step-to pattern;Step-through pattern;Decreased step length - left;Decreased stance time - left;Decreased stride length Gait velocity: decreased Gait velocity interpretation: <1.31 ft/sec, indicative of household ambulator General Gait Details: Paretic gait on  the Left with need for w/shift assist, assist advancing L LE off toe and swing through and assist translating L hip forward.  Second person helping control the RW and some excess forward motion at [employer]    Modified Rankin (Stroke Patients Only) Modified Rankin (Stroke Patients Only) Pre-Morbid Rankin Score: No symptoms Modified Rankin: Moderately severe disability     Balance Overall balance assessment: Needs assistance Sitting-balance support: Feet supported;Single extremity supported   Sitting balance - Comments: UE supported sitting and stability assist to scoot forward.   Standing balance support: During functional activity Standing balance-Leahy Scale: Poor Standing balance comment: Assist or RW and external support                            Cognition Arousal/Alertness: Awake/alert Behavior During Therapy: Flat affect Overall Cognitive Status: Impaired/Different from baseline                           Safety/Judgement: Decreased awareness of safety;Decreased awareness of deficits Awareness: Emergent Problem Solving: Requires verbal cues;Requires tactile cues        Exercises Other Exercises Other Exercises: ROM bil LE with AAROM L LE and bil graded gross extension    General Comments        Pertinent Vitals/Pain Pain Assessment: No/denies pain    Home Living     Available Help at Discharge: Available 24 hours/day                Prior Function  PT Goals (current goals can now be found in the care plan section) Acute Rehab PT Goals Patient Stated Goal: To improve strength PT Goal Formulation: With patient Time For Goal Achievement: 04/30/20 Potential to Achieve Goals: Good Progress towards PT goals: Progressing toward goals    Frequency    Min 4X/week      PT Plan Current plan remains appropriate    Co-evaluation              AM-PAC PT "6 Clicks"  Mobility   Outcome Measure  Help needed turning from your back to your side while in a flat bed without using bedrails?: A Little Help needed moving from lying on your back to sitting on the side of a flat bed without using bedrails?: A Lot Help needed moving to and from a bed to a chair (including a wheelchair)?: A Lot Help needed standing up from a chair using your arms (e.g., wheelchair or bedside chair)?: A Lot Help needed to walk in hospital room?: A Lot Help needed climbing 3-5 steps with a railing? : Total 6 Click Score: 12    End of Session   Activity Tolerance: Patient tolerated treatment well Patient left: in bed;with call bell/phone within reach;with bed alarm set Nurse Communication: Mobility status PT Visit Diagnosis: Other abnormalities of gait and mobility (R26.89);Hemiplegia and hemiparesis Hemiplegia - Right/Left: Left Hemiplegia - dominant/non-dominant: Non-dominant Hemiplegia - caused by: Cerebral infarction     Time: 6789-3810 PT Time Calculation (min) (ACUTE ONLY): 25 min  Charges:  $Gait Training: 8-22 mins $Therapeutic Activity: 8-22 mins                     04/20/2020  Ginger Carne., PT Acute Rehabilitation Services [phone]  (pager) [phone]  (office)   Tessie Fass Audel Coakley 04/20/2020, 5:16 PM

## 2020-04-20 NOTE — Progress Notes (Signed)
PROGRESS NOTE    Tony Schmitt  JZP:[id number] DOB: 03-20-1971 DOA: 04/15/2020 PCP: No primary care provider on file.   Brief Narrative: 49 year old PMH hypertension, diabetes, CKD stage IIIa who presents to the ED with left-sided weakness. Patient weakness a started on September 04/2020 evening but it was mild. He wake up on 9/10 with worsening weakness. He was not able to stand up from the floor and call his mom. Evaluation in the ED CT head showed low-attenuation changes in the right periventricular white matter concerning for acute infarct versus mass lesion. Systolic blood pressure markedly elevated more than 200.  Assessment & Plan:   Principal Problem:   Acute CVA (cerebrovascular accident) ([place]) Active Problems:   Dyslipidemia associated with type 2 diabetes mellitus (HCC)   CKD (chronic kidney disease) stage 3, GFR 30-59 ml/min   Hypertensive urgency   Type 2 diabetes mellitus with vascular disease ([place])   Acute CVA, involve posterior right corona and anterior aspect of left external capsule. -MRI; acute subacute nonhemorrhagic infarct involving posterior right corona radiata  and anterior aspect of the left external capsule. Right-sided infarct likely impact corticospinal tracts involving left  motor function. Punctate cortical infarct along the inferior left frontal operculum.  -MRA; moderate to high-grade stenosis at the junction of the right V3 and V4 segment with segmental narrowing of the V4 segment above this level. Moderate proximal basilar artery stenosis. Atherosclerotic irregularity in the cavernous internal carotid arteries bilaterally with 50 to 60% stenosis of the supraclinoid right ICA. Moderate stenosis of the right posterior communicating artery with asymmetric attenuation of distal PCA branch vessel on the right. Patient remains on aspirin and statin.  Patient underwent TEE which was negative for thrombus.  Possible small PFO noted.  Lower extremity Dopplers negative  for DVT. ANA is negative.  Anticardiolipin antibodies within normal range. A loop recorder was recommended by neurology.  Will notify cardiology.  CKD stage IIIa/normal anion gap metabolic acidosis; Prior cr 1.8 Presents with Cr :3.5--2.9 Patient was started on IV fluids with improvement in his creatinine.  Down to 2.93 yesterday.  Not checked today.  Also on sodium bicarbonate for metabolic acidosis. Continue to monitor urine output.  Recheck labs tomorrow  Hyperlipidemia;  LDL 584. Started on lipitor.  Recheck labs in a few weeks.  Diabetes Type 2;  Hb-A1c; 9.7 On low dose lantus.  Needs to continue to hold metformin due to CKD CBGs are reasonably well controlled.  potassium abnormalities: Noted to be hypokalemic at times.  Noted to be hypokalemic ~yesterday.  Will recheck labs tomorrow.  Leukocytosis Etiology unclear.  Has been improving.  Normocytic anemia No evidence for overt blood loss..  Likely due to chronic disease.  Essential hypertension Initially allowed permissive hypertension.  Plan to control his blood pressure gradually.  He was started on amlodipine.  Was on same at home.  Increase to his home dose today.  Left Arm swelling;  No DVT noted  Estimated body mass index is 27.7 kg/m as calculated from the following:   Height as of this encounter: 5\' 4"  (1.626 m).   Weight as of this encounter: 73.2 kg.   DVT prophylaxis: Lovenox Code Status: Full code Family Communication: No family at bedside Disposition Plan: Plan is for him to go to inpatient rehabilitation Status is: Inpatient  Remains inpatient appropriate because:Ongoing diagnostic testing needed not appropriate for outpatient work up   Dispo: The patient is from: Home  Anticipated d/c is to: Inpatient rehabilitation when bed is available              Anticipated d/c date is: 2 days              Patient currently is not medically stable to d/c.     Consultants:   Neurology    Procedures:   ECHO; normal EF, no evidence of thrombus  TEE  Antimicrobials:    Subjective: Denies any new complaints.  Continues to have weakness on the left side.  No nausea vomiting.   Objective: Vitals:   04/19/20 2022 04/19/20 2357 04/20/20 0415 04/20/20 0732  BP: (!) 194/100 (!) 177/102 (!) 185/91 (!) 194/95  Pulse: 83 84 84 87  Resp: 19 18 19 20   Temp: 98.7 F (37.1 C) 98.6 F (37 C) 98.6 F (37 C) 98.7 F (37.1 C)  TempSrc: Oral Oral Oral Oral  SpO2: 100% 100% 100% 99%  Weight:      Height:        Intake/Output Summary (Last 24 hours) at 04/20/2020 1108 Last data filed at 04/19/2020 1804 Gross per 24 hour  Intake 240 ml  Output 750 ml  Net -510 ml   Filed Weights   04/16/20 2145  Weight: 73.2 kg    Examination:  General appearance: Awake alert.  In no distress Resp: Clear to auscultation bilaterally.  Normal effort Cardio: S1-S2 is normal regular.  No S3-S4.  No rubs murmurs or bruit GI: Abdomen is soft.  Nontender nondistended.  Bowel sounds are present normal.  No masses organomegaly Extremities: No edema.  Full range of motion of lower extremities. Neurologic: Left-sided weakness noted.    Data Reviewed: I have personally reviewed following labs and imaging studies  CBC: Recent Labs  Lab 04/16/20 0044 04/16/20 0045 04/16/20 0558 04/17/20 0824 04/18/20 0910  WBC  --  12.0* 12.1* 10.8* 10.8*  NEUTROABS  --  9.5*  --   --   --   HGB 10.5* 10.7* 10.9* 10.6* 9.4*  HCT 31.0* 33.3* 34.5* 33.1* 29.5*  MCV  --  85.4 85.8 84.7 85.0  PLT  --  455* 459* 434* 497*   Basic Metabolic Panel: Recent Labs  Lab 04/16/20 0558 04/17/20 0824 04/17/20 1450 04/18/20 0910 04/19/20 0843  NA 142 136 137 140 136  K 3.6 5.7* 3.3* 3.2* 3.4*  CL 108 110 108 112* 108  CO2 24 16* 22 20* 21*  GLUCOSE 159* 107* 132* 93 139*  BUN 31* 30* 29* 31* 32*  CREATININE 2.70* 3.08* 3.01* 3.06* 2.93*  CALCIUM 9.1 9.1 9.0 8.9 8.6*  MG  --  1.8  --   --   --     GFR: Estimated Creatinine Clearance: 27.6 mL/min (A) (by C-G formula based on SCr of 2.93 mg/dL (H)). Liver Function Tests: Recent Labs  Lab 04/16/20 0045 04/16/20 0558  AST 21 20  ALT 18 16  ALKPHOS 82 88  BILITOT 0.6 0.6  PROT 5.4* 5.4*  ALBUMIN 1.9* 1.8*   Coagulation Profile: Recent Labs  Lab 04/16/20 0021  INR 1.0   CBG: Recent Labs  Lab 04/19/20 0621 04/19/20 1209 04/19/20 1615 04/19/20 2058 04/20/20 0614  GLUCAP 156* 162* 112* 169* 127*     Recent Results (from the past 240 hour(s))  SARS Coronavirus 2 by RT PCR (hospital order, performed in [facility] hospital lab) Nasopharyngeal Nasopharyngeal Swab     Status: None   Collection Time: 04/16/20  1:30 AM   Specimen:  Nasopharyngeal Swab  Result Value Ref Range Status   SARS Coronavirus 2 NEGATIVE NEGATIVE Final    Comment: (NOTE) SARS-CoV-2 target nucleic acids are NOT DETECTED.  The SARS-CoV-2 RNA is generally detectable in upper and lower respiratory specimens during the acute phase of infection. The lowest concentration of SARS-CoV-2 viral copies this assay can detect is 250 copies / mL. A negative result does not preclude SARS-CoV-2 infection and should not be used as the sole basis for treatment or other patient management decisions.  A negative result may occur with improper specimen collection / handling, submission of specimen other than nasopharyngeal swab, presence of viral mutation(s) within the areas targeted by this assay, and inadequate number of viral copies (<250 copies / mL). A negative result must be combined with clinical observations, patient history, and epidemiological information.  Fact Sheet for Patients:   StrictlyIdeas.no  Fact Sheet for Healthcare Providers: BankingDealers.co.za  This test is not yet approved or  cleared by the [place] FDA and has been authorized for detection and/or diagnosis of SARS-CoV-2 by FDA  under an Emergency Use Authorization (EUA).  This EUA will remain in effect (meaning this test can be used) for the duration of the COVID-19 declaration under Section 564(b)(1) of the Act, 21 U.S.C. section 360bbb-3(b)(1), unless the authorization is terminated or revoked sooner.  Performed at [facility], [place] [address], [place], [place] [address]   Urine Culture     Status: Abnormal   Collection Time: 04/17/20  7:24 AM   Specimen: Urine, Random  Result Value Ref Range Status   Specimen Description URINE, RANDOM  Final   Special Requests   Final    NONE Performed at [facility], [place] [address], [place], [place] [address]    Culture MULTIPLE SPECIES PRESENT, SUGGEST RECOLLECTION (A)  Final   Report Status 04/18/2020 FINAL  Final         Radiology Studies: VAS [place] TRANSCRANIAL DOPPLER W BUBBLES  Result Date: 04/19/2020  Transcranial Doppler with Bubble Indications: Stroke. Performing Technologist: Abram Sander RVS  Examination Guidelines: A complete evaluation includes B-mode imaging, spectral Doppler, color Doppler, and power Doppler as needed of all accessible portions of each vessel. Bilateral testing is considered an integral part of a complete examination. Limited examinations for reoccurring indications may be performed as noted.  Summary: No HITS at rest or during Valsalva. Negative transcranial Doppler Bubble study with no evidence of right to left intracardiac communication.  A vascular evaluation was performed. The right middle cerebral artery was studied. An IV was inserted into the patient's right forearm . Verbal informed consent was obtained.  Negative TCD Bubble study *See table(s) above for TCD measurements and observations.  Diagnosing physician: Antony Contras MD Electronically signed by Antony Contras MD on 04/19/2020 at 12:33:39 PM.    Final    ECHO TEE  Result Date: 04/19/2020    TRANSESOPHOGEAL ECHO REPORT   Patient Name:   REMUS HAGEDORN Granderson Date  of Exam: 04/18/2020 Medical Rec #:  [id number]     Height:       64.0 in Accession #:    [id number]    Weight:       161.4 lb Date of Birth:  04-23-1971     BSA:          1.786 m Patient Age:    49 years      BP:           124/69 mmHg Patient Gender: M  HR:           88 bpm. Exam Location:  Inpatient Procedure: Transesophageal Echo Indications:    stroke 434.9`  History:        Patient has prior history of Echocardiogram examinations, most                 recent 04/16/2020. Arrythmias:PVC; Risk Factors:Hypertension,                 Diabetes and Former Smoker.  Sonographer:    Jannett Celestine RDCS (AE) Referring Phys: [id number] ANGELA NICOLE DUKE PROCEDURE: The transesophogeal probe was passed without difficulty through the esophogus of the patient. Sedation performed by different physician. The patient's vital signs; including heart rate, blood pressure, and oxygen saturation; remained stable throughout the procedure. The patient developed no complications during the procedure. IMPRESSIONS  1. Left ventricular ejection fraction, by estimation, is 60 to 65%. The left ventricle has normal function. The left ventricle has no regional wall motion abnormalities.  2. Right ventricular systolic function is normal. The right ventricular size is normal.  3. No left atrial/left atrial appendage thrombus was detected.  4. The mitral valve is normal in structure. Trivial mitral valve regurgitation. No evidence of mitral stenosis.  5. The aortic valve is normal in structure. Aortic valve regurgitation is not visualized. No aortic stenosis is present.  6. The inferior vena cava is normal in size with greater than 50% respiratory variability, suggesting  7. Possible very small PFO noted with agitated saline contrast study with late bubbles after 7 cardiac cycles and Valsalva. Conclusion(s)/Recommendation(s): Normal biventricular function without evidence of hemodynamically significant valvular heart disease. FINDINGS  Left  Ventricle: Left ventricular ejection fraction, by estimation, is 60 to 65%. The left ventricle has normal function. The left ventricle has no regional wall motion abnormalities. The left ventricular internal cavity size was normal in size. There is  no left ventricular hypertrophy. Right Ventricle: The right ventricular size is normal. No increase in right ventricular wall thickness. Right ventricular systolic function is normal. Left Atrium: Left atrial size was normal in size. No left atrial/left atrial appendage thrombus was detected. Right Atrium: Right atrial size was normal in size. Pericardium: There is no evidence of pericardial effusion. Mitral Valve: The mitral valve is normal in structure. Trivial mitral valve regurgitation. No evidence of mitral valve stenosis. Tricuspid Valve: The tricuspid valve is normal in structure. Tricuspid valve regurgitation is not demonstrated. No evidence of tricuspid stenosis. Aortic Valve: The aortic valve is normal in structure. Aortic valve regurgitation is not visualized. No aortic stenosis is present. Pulmonic Valve: The pulmonic valve was normal in structure. Pulmonic valve regurgitation is trivial. No evidence of pulmonic stenosis. Aorta: The aortic root is normal in size and structure. There is minimal (Grade I) layered plaque. Venous: The inferior vena cava is normal in size with greater than 50% respiratory variability, suggesting right atrial pressure of 3 mmHg. IAS/Shunts: Possible small shunt. Agitated saline contrast was given intravenously to evaluate for intracardiac shunting. Possible very small PFO noted with agitated saline contrast study with late bubbles after 7 cardiac cycles and Valsalva. [name] MD Electronically signed by [name] MD Signature Date/Time: 04/19/2020/12:47:22 PM    Final    VAS US CAROTID (at [facility] and [facility] only)  Result Date: 04/19/2020 Carotid Arterial Duplex Study Indications:       CVA. Risk Factors:      Hypertension,  Diabetes. Comparison Study:  no prior Performing Technologist: Abram Sander RVS  Examination Guidelines: A complete evaluation includes B-mode imaging, spectral Doppler, color Doppler, and power Doppler as needed of all accessible portions of each vessel. Bilateral testing is considered an integral part of a complete examination. Limited examinations for reoccurring indications may be performed as noted.  Right Carotid Findings: +----------+--------+--------+--------+------------------+--------+           PSV cm/sEDV cm/sStenosisPlaque DescriptionComments +----------+--------+--------+--------+------------------+--------+ CCA Prox  100     19              heterogenous               +----------+--------+--------+--------+------------------+--------+ CCA Distal76      14              heterogenous               +----------+--------+--------+--------+------------------+--------+ ICA Prox  66      28      1-39%   heterogenous      tortuous +----------+--------+--------+--------+------------------+--------+ ICA Distal59      23                                         +----------+--------+--------+--------+------------------+--------+ ECA       104     11                                         +----------+--------+--------+--------+------------------+--------+ +----------+--------+-------+--------+-------------------+           PSV cm/sEDV cmsDescribeArm Pressure (mmHG) +----------+--------+-------+--------+-------------------+ Subclavian102                                        +----------+--------+-------+--------+-------------------+ +---------+--------+--+--------+--+---------+ VertebralPSV cm/s66EDV cm/s21Antegrade +---------+--------+--+--------+--+---------+  Left Carotid Findings: +----------+--------+--------+--------+------------------+--------+           PSV cm/sEDV cm/sStenosisPlaque DescriptionComments  +----------+--------+--------+--------+------------------+--------+ CCA Prox  106     22              heterogenous               +----------+--------+--------+--------+------------------+--------+ CCA Distal96      22              heterogenous               +----------+--------+--------+--------+------------------+--------+ ICA Prox  75      25      1-39%   heterogenous      tortuous +----------+--------+--------+--------+------------------+--------+ ICA Distal97      37                                         +----------+--------+--------+--------+------------------+--------+ ECA       86      11                                         +----------+--------+--------+--------+------------------+--------+ +----------+--------+--------+--------+-------------------+           PSV cm/sEDV cm/sDescribeArm Pressure (mmHG) +----------+--------+--------+--------+-------------------+ Subclavian206                                         +----------+--------+--------+--------+-------------------+ +---------+--------+--+--------+--+---------+  VertebralPSV cm/s44EDV cm/s18Antegrade +---------+--------+--+--------+--+---------+   Summary: Right Carotid: Velocities in the right ICA are consistent with a 1-39% stenosis. Left Carotid: Velocities in the left ICA are consistent with a 1-39% stenosis. Vertebrals: Bilateral vertebral arteries demonstrate antegrade flow. *See table(s) above for measurements and observations.  Electronically signed by Antony Contras MD on 04/19/2020 at 12:33:14 PM.    Final    VAS [place] LOWER EXTREMITY VENOUS (DVT)  Result Date: 04/19/2020  Lower Venous DVTStudy Indications: Stroke.  Comparison Study: no prior Performing Technologist: Abram Sander RVS  Examination Guidelines: A complete evaluation includes B-mode imaging, spectral Doppler, color Doppler, and power Doppler as needed of all accessible portions of each vessel. Bilateral testing is  considered an integral part of a complete examination. Limited examinations for reoccurring indications may be performed as noted. The reflux portion of the exam is performed with the patient in reverse Trendelenburg.  +---------+---------------+---------+-----------+----------+--------------+ RIGHT    CompressibilityPhasicitySpontaneityPropertiesThrombus Aging +---------+---------------+---------+-----------+----------+--------------+ CFV      Full           Yes      Yes                                 +---------+---------------+---------+-----------+----------+--------------+ SFJ      Full                                                        +---------+---------------+---------+-----------+----------+--------------+ FV Prox  Full                                                        +---------+---------------+---------+-----------+----------+--------------+ FV Mid   Full                                                        +---------+---------------+---------+-----------+----------+--------------+ FV DistalFull                                                        +---------+---------------+---------+-----------+----------+--------------+ PFV      Full                                                        +---------+---------------+---------+-----------+----------+--------------+ POP      Full           Yes      Yes                                 +---------+---------------+---------+-----------+----------+--------------+ PTV      Full                                                        +---------+---------------+---------+-----------+----------+--------------+  PERO     Full                                                        +---------+---------------+---------+-----------+----------+--------------+   +---------+---------------+---------+-----------+----------+--------------+ LEFT      CompressibilityPhasicitySpontaneityPropertiesThrombus Aging +---------+---------------+---------+-----------+----------+--------------+ CFV      Full           Yes      Yes                                 +---------+---------------+---------+-----------+----------+--------------+ SFJ      Full                                                        +---------+---------------+---------+-----------+----------+--------------+ FV Prox  Full                                                        +---------+---------------+---------+-----------+----------+--------------+ FV Mid   Full                                                        +---------+---------------+---------+-----------+----------+--------------+ FV DistalFull                                                        +---------+---------------+---------+-----------+----------+--------------+ PFV      Full                                                        +---------+---------------+---------+-----------+----------+--------------+ POP      Full           Yes      Yes                                 +---------+---------------+---------+-----------+----------+--------------+ PTV      Full                                                        +---------+---------------+---------+-----------+----------+--------------+ PERO     Full                                                        +---------+---------------+---------+-----------+----------+--------------+       Summary: BILATERAL: - No evidence of deep vein thrombosis seen in the lower extremities, bilaterally. - No evidence of superficial venous thrombosis in the lower extremities, bilaterally. -   *See table(s) above for measurements and observations. Electronically signed by Curt Jews MD on 04/19/2020 at 5:06:45 PM.    Final    VAS [place] UPPER EXTREMITY VENOUS DUPLEX  Result Date: 04/19/2020 UPPER VENOUS STUDY  Indications: Swelling  Comparison Study: no prior Performing Technologist: Abram Sander RVS  Examination Guidelines: A complete evaluation includes B-mode imaging, spectral Doppler, color Doppler, and power Doppler as needed of all accessible portions of each vessel. Bilateral testing is considered an integral part of a complete examination. Limited examinations for reoccurring indications may be performed as noted.  Left Findings: +----------+------------+---------+-----------+----------+--------------+ LEFT      CompressiblePhasicitySpontaneousProperties   Summary     +----------+------------+---------+-----------+----------+--------------+ IJV           Full       Yes       Yes                             +----------+------------+---------+-----------+----------+--------------+ Subclavian    Full       Yes       Yes                             +----------+------------+---------+-----------+----------+--------------+ Axillary      Full       Yes       Yes                             +----------+------------+---------+-----------+----------+--------------+ Brachial      Full       Yes       Yes                             +----------+------------+---------+-----------+----------+--------------+ Radial        Full                                                 +----------+------------+---------+-----------+----------+--------------+ Ulnar         Full                                                 +----------+------------+---------+-----------+----------+--------------+ Cephalic                                            Not visualized +----------+------------+---------+-----------+----------+--------------+ Basilic                                             Not visualized +----------+------------+---------+-----------+----------+--------------+  Summary:  Left: No evidence of deep vein thrombosis in the upper extremity.  *See table(s) above for measurements and  observations.  Diagnosing physician: Curt Jews MD Electronically signed by Curt Jews MD on 04/19/2020 at 5:06:34  PM.    Final         Scheduled Meds: .  stroke: mapping our early stages of recovery book   Does not apply Once  . amLODipine  10 mg Oral Daily  . aspirin  81 mg Oral Daily  . atorvastatin  80 mg Oral QHS  . enoxaparin (LOVENOX) injection  30 mg Subcutaneous Q24H  . insulin aspart  0-9 Units Subcutaneous TID WC  . insulin glargine  10 Units Subcutaneous Daily  . pantoprazole  40 mg Oral Daily  . polyethylene glycol  17 g Oral Daily  . sodium bicarbonate  1,300 mg Oral BID   Continuous Infusions: . sodium chloride 50 mL/hr at 04/19/20 1752     LOS: 4 days       Bonnielee Haff, MD [facility]   If 7PM-7AM, please contact night-coverage [email or web address]  04/20/2020, 11:08 AM

## 2020-04-20 NOTE — PMR Pre-admission (Addendum)
PMR Admission Coordinator Pre-Admission Assessment  Patient: Tony Schmitt is an 49 y.o., male MRN: [id number] DOB: Dec 19, 1970 Height: 5\' 4"  (162.6 cm) Weight: 73.2 kg              Insurance Information HMO:    PPO:      PCP:      IPA:      80/20:      OTHER:  PRIMARY: uninsured       Development worker, community:       Phone#:   [employer], [employer]" for patients in Inpatient Rehabilitation Facilities with attached "Privacy Act [place] Records" was provided and verbally reviewed with: N/A  Emergency Contact Information Contact Information    Name Relation Home Work [place] Mother [phone]  [phone]   Andre Lefort [phone]  [phone]     Current Medical History  Patient Admitting Diagnosis: bilateral infarcts  History of Present Illness: 49 year old right-handed male with history of diabetes mellitus, tobacco abuse, CKD stage III with creatinine baseline 2.42 08/02/2018, hypertension as well as medical noncompliance.   Presented 04/15/2020 with left-sided weakness.  Blood pressure 176/109.  CT/MRI as well as MRA of the head and neck showed acute subacute nonhemorrhagic infarct involving the posterior right corona radiata and anterior aspect of the left external capsule.  Punctate cortical infarct along the inferior left frontal operculum.  No flow signal in the distal left vertebral artery above the craniocervical junction.  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  Carotid Dopplers with 1 to 39% ICA stenosis.  Patient did not receive TPA.  Admission chemistries alcohol negative, urine drug screen negative, creatinine 3.35, glucose 227, BUN 31, hemoglobin A1c 9.6.  Echocardiogram with ejection fraction of 60 to 65% no wall motion abnormalities.  Currently maintained on aspirin for CVA prophylaxis.  Subcutaneous Lovenox for DVT  prophylaxis with lower extremity Dopplers negative for DVT.  TEE showed ejection fraction 65% no evidence of thrombus possible very small PFO.  TCD study was completed negative for PFO.  Monitoring of renal function creatinine 3.06 maintained on IV fluids.  Tolerating a regular diet. LOOP is not planned at this time, but recommended as an outpatient.  Complete NIHSS TOTAL: 6 Glasgow Coma Scale Score: (P) 15  Past Medical History  Past Medical History:  Diagnosis Date  . Diabetes mellitus without complication ([place])   . Hypertension     Family History  family history includes Cancer in his father; Diabetes in his father and mother; Heart failure in an other family member; Hypertension in his father.  Prior Rehab/Hospitalizations:  Has the patient had prior rehab or hospitalizations prior to admission? Yes  Has the patient had major surgery during 100 days prior to admission? No  Current Medications   Current Facility-Administered Medications:  .   stroke: mapping our early stages of recovery book, , Does not apply, Once, Rise Patience, MD .  acetaminophen (TYLENOL) tablet 650 mg, 650 mg, Oral, Q4H PRN **OR** acetaminophen (TYLENOL) 160 MG/5ML solution 650 mg, 650 mg, Per Tube, Q4H PRN **OR** acetaminophen (TYLENOL) suppository 650 mg, 650 mg, Rectal, Q4H PRN, Rise Patience, MD .  amLODipine (NORVASC) tablet 10 mg, 10 mg, Oral, Daily, Bonnielee Haff, MD, 10 mg at 04/22/20 0912 .  aspirin chewable tablet 81 mg, 81 mg, Oral, Daily, Rise Patience, MD, 81 mg at 04/22/20 0913 .  atorvastatin (LIPITOR) tablet 80 mg,  80 mg, Oral, QHS, Rise Patience, MD, 80 mg at 04/21/20 2151 .  enoxaparin (LOVENOX) injection 30 mg, 30 mg, Subcutaneous, Q24H, Pierce, Dwayne A, RPH, 30 mg at 04/22/20 0915 .  hydrALAZINE (APRESOLINE) injection 10 mg, 10 mg, Intravenous, Q4H PRN, Rise Patience, MD, 10 mg at 04/16/20 1828 .  insulin aspart (novoLOG) injection 0-9 Units, 0-9 Units,  Subcutaneous, TID WC, Rise Patience, MD, 1 Units at 04/21/20 1753 .  insulin glargine (LANTUS) injection 10 Units, 10 Units, Subcutaneous, Daily, Rise Patience, MD, 10 Units at 04/22/20 1055 .  ondansetron (ZOFRAN) injection 4 mg, 4 mg, Intravenous, Q6H PRN, Shalhoub, Sherryll Burger, MD .  pantoprazole (PROTONIX) EC tablet 40 mg, 40 mg, Oral, Daily, Rise Patience, MD, 40 mg at 04/22/20 0914 .  polyethylene glycol (MIRALAX / GLYCOLAX) packet 17 g, 17 g, Oral, Daily, Bonnielee Haff, MD, 17 g at 04/22/20 0911 .  sodium bicarbonate tablet 1,300 mg, 1,300 mg, Oral, BID, Regalado, Belkys A, MD, 1,300 mg at 04/22/20 0913  Patients Current Diet:  Diet Order            Diet heart healthy/carb modified Room service appropriate? Yes; Fluid consistency: Thin  Diet effective now                 Precautions / Restrictions Precautions Precautions: Fall Precaution Comments: left hemiparesis Restrictions Weight Bearing Restrictions: No   Has the patient had 2 or more falls or a fall with injury in the past year?No  Prior Activity Level Limited Community (1-2x/wk): Independent and unemployed  Prior Functional Level Prior Function Level of Independence: Independent  Self Care: Did the patient need help bathing, dressing, using the toilet or eating?  Independent  Indoor Mobility: Did the patient need assistance with walking from room to room (with or without device)? Independent  Stairs: Did the patient need assistance with internal or external stairs (with or without device)? Independent  Functional Cognition: Did the patient need help planning regular tasks such as shopping or remembering to take medications? Independent  Home Assistive Devices / Equipment Home Equipment: Walker - 2 wheels  Prior Device Use: Indicate devices/aids used by the patient prior to current illness, exacerbation or injury? None of the above  Current Functional Level Cognition   Arousal/Alertness: Awake/alert Overall Cognitive Status: Impaired/Different from baseline Orientation Level: (P) Oriented X4 Safety/Judgement: Decreased awareness of safety, Decreased awareness of deficits General Comments: had difficulty problem-solving which LE to advance first (after cued several times by PT); could not problem-solve best place to put chair (L vs R)  for safe transfer    Extremity Assessment (includes Sensation/Coordination)  Upper Extremity Assessment: LUE deficits/detail RUE Deficits / Details: WFL ROM, 5/5 Strength throughout RUE Sensation: WNL RUE Coordination: WNL LUE Deficits / Details: shoulder 2/5, elbow 0/5, wrist 0/5, fingers 0/5 whole arm edemous  LUE Sensation: WNL LUE Coordination: decreased fine motor, decreased gross motor  Lower Extremity Assessment: Defer to PT evaluation LLE Deficits / Details: grossly 2/5, decreased DF, denies decreased sensation LLE Coordination: decreased gross motor, decreased fine motor    ADLs  Overall ADL's : Needs assistance/impaired Eating/Feeding: Set up Grooming: Oral care, Wash/dry face, Wash/dry hands, Minimal assistance, Sitting Grooming Details (indicate cue type and reason): Pt required cues for problem solving and incorporating RUE in care for LUE Upper Body Bathing: Minimal assistance, Set up, Sitting Lower Body Bathing: Minimal assistance, Set up, Sit to/from stand Upper Body Dressing : Moderate assistance, Set up, Sitting Lower Body  Dressing: Moderate assistance, Sit to/from stand Lower Body Dressing Details (indicate cue type and reason): Unable to donn socks at edge of stretcher. Performed pseudo pant activity and needed min assist. Toilet Transfer: Minimal assistance, Stand-pivot, BSC Toileting- Clothing Manipulation and Hygiene: Minimal assistance Toileting - Clothing Manipulation Details (indicate cue type and reason): Reports wiping himself, partial assistance needed for clothing management    Mobility   Overal bed mobility: Needs Assistance Bed Mobility: Supine to Sit Rolling: Min assist Sidelying to sit: Mod assist (flat bed) Supine to sit: Mod assist, HOB elevated Sit to supine: Mod assist General bed mobility comments: pt allowed to choose his technique for coming to sit, he advanced RLE over EOB, requested assist with his LLE, required cues to bring his LUE across his body (and to grab UE by his forearm), cues and assist to push up towards sitting with RUE    Transfers  Overall transfer level: Needs assistance Equipment used: Rolling walker (2 wheeled), None Transfers: Sit to/from Stand Sit to Stand: Mod assist, Min assist (mod initially, progressed to min with reps) Stand pivot transfers: Mod assist General transfer comment: cues for hand placement each transfer; poor mechanics initially, improved with repetition to need min assist    Ambulation / Gait / Stairs / Wheelchair Mobility  Ambulation/Gait Ambulation/Gait assistance: Mod assist Gait Distance (Feet): 15 Feet Assistive device: Rolling walker (2 wheeled) Gait Pattern/deviations: Step-to pattern, Decreased step length - left, Decreased stance time - left, Decreased stride length, Decreased dorsiflexion - left, Decreased weight shift to left General Gait Details: Patient advancing LLE via use of adductors with LLE in external rotation and "sliding" foot along the floor then pressing left knee into hyperextension for weight-bearing and advancing RLE; facilitated to advance LLE with knee flexion, not lock into hyperextension (he does not buckle), and prevent left hip retraction Gait velocity: decreased Gait velocity interpretation: <1.31 ft/sec, indicative of household ambulator    Posture / Balance Dynamic Sitting Balance Sitting balance - Comments: UE supported sitting and stability; vc and incr time to scoot left hip forward. Balance Overall balance assessment: Needs assistance Sitting-balance support: Feet  supported Sitting balance-Leahy Scale: Poor Sitting balance - Comments: UE supported sitting and stability; vc and incr time to scoot left hip forward. Standing balance support: During functional activity Standing balance-Leahy Scale: Poor Standing balance comment: Assist or RW and external support High Level Balance Comments: keeps weight shifted to stronger right side to balance    Special needs/care consideration Hgb A1c 9.6 Visitor is Mom, Norren     Previous Home Environment  Living Arrangements: Parent  Lives With: Family Available Help at Discharge: Available 24 hours/day Type of Home: House Home Layout: One level Home Access: Stairs to enter Entrance Stairs-Rails: Right Entrance Stairs-Number of Steps: 2 Bathroom Shower/Tub: Public librarian, Architectural technologist: Programmer, systems: Yes How Accessible: Accessible via walker Home Care Services: No Additional Comments: tub/shower  Discharge Living Setting Plans for Discharge Living Setting: Lives with (comment), House (Mom) Type of Home at Discharge: House Discharge Home Layout: One level Discharge Home Access: Stairs to enter Entrance Stairs-Rails: Right Entrance Stairs-Number of Steps: 2 Discharge Bathroom Shower/Tub: Tub/shower unit, Curtain Discharge Bathroom Toilet: Standard Discharge Bathroom Accessibility: Yes How Accessible: Accessible via walker Does the patient have any problems obtaining your medications?: Yes (Describe) (uninsured)  Social/Family/Support Systems Contact Information: Mom, Therapist, music Anticipated Caregiver: Mom Anticipated Caregiver's Contact Information: see above Caregiver Availability: 24/7 Discharge Plan Discussed with Primary Caregiver: Yes Is Caregiver In Agreement with  Plan?: Yes Does Caregiver/Family have Issues with Lodging/Transportation while Pt is in Rehab?: No  Goals Patient/Family Goal for Rehab: Supervision/Min A with PT and OT Expected length of stay:  [facility] 13-17 days. Pt/Family Agrees to Admission and willing to participate: Yes Program Orientation Provided & Reviewed with Pt/Caregiver Including Roles  & Responsibilities: Yes  Decrease burden of Care through IP rehab admission: n/a  Possible need for SNF placement upon discharge:not antiicpated  Patient Condition: This patient's medical and functional status has changed since the consult dated: 04/18/2020 in which the Rehabilitation Physician determined and documented that the patient's condition is appropriate for intensive rehabilitative care in an inpatient rehabilitation facility. See "History of Present Illness" (above) for medical update. Functional changes are: overall mod assist. Patient's medical and functional status update has been discussed with the Rehabilitation physician and patient remains appropriate for inpatient rehabilitation. Will admit to inpatient rehab today.  Preadmission Screen Completed By:  Cleatrice Burke, RN, 04/22/2020 11:13 AM ______________________________________________________________________   Discussed status with Dr. Posey Pronto on 04/22/2020 at  1114 and received approval for admission today.  Admission Coordinator:  Cleatrice Burke, time [id number] Date 04/22/2020

## 2020-04-21 ENCOUNTER — Encounter (HOSPITAL_COMMUNITY): Payer: Self-pay | Admitting: Cardiology

## 2020-04-21 LAB — CBC
HCT: 30 % — ABNORMAL LOW (ref 39.0–52.0)
Hemoglobin: 9.6 g/dL — ABNORMAL LOW (ref 13.0–17.0)
MCH: 27 pg (ref 26.0–34.0)
MCHC: 32 g/dL (ref 30.0–36.0)
MCV: 84.3 fL (ref 80.0–100.0)
Platelets: 386 10*3/uL (ref 150–400)
RBC: 3.56 MIL/uL — ABNORMAL LOW (ref 4.22–5.81)
RDW: 13.9 % (ref 11.5–15.5)
WBC: 9.2 10*3/uL (ref 4.0–10.5)
nRBC: 0 % (ref 0.0–0.2)

## 2020-04-21 LAB — BASIC METABOLIC PANEL
Anion gap: 7 (ref 5–15)
BUN: 30 mg/dL — ABNORMAL HIGH (ref 6–20)
CO2: 21 mmol/L — ABNORMAL LOW (ref 22–32)
Calcium: 8.7 mg/dL — ABNORMAL LOW (ref 8.9–10.3)
Chloride: 109 mmol/L (ref 98–111)
Creatinine, Ser: 2.92 mg/dL — ABNORMAL HIGH (ref 0.61–1.24)
GFR calc Af Amer: 28 mL/min — ABNORMAL LOW (ref >60)
GFR calc non Af Amer: 24 mL/min — ABNORMAL LOW (ref >60)
Glucose, Bld: 210 mg/dL — ABNORMAL HIGH (ref 70–99)
Potassium: 3.7 mmol/L (ref 3.5–5.1)
Sodium: 137 mmol/L (ref 135–145)

## 2020-04-21 LAB — GLUCOSE, CAPILLARY
Glucose-Capillary: 116 mg/dL — ABNORMAL HIGH (ref 70–99)
Glucose-Capillary: 142 mg/dL — ABNORMAL HIGH (ref 70–99)
Glucose-Capillary: 225 mg/dL — ABNORMAL HIGH (ref 70–99)
Glucose-Capillary: 143 mg/dL — ABNORMAL HIGH (ref 70–99)
Glucose-Capillary: 187 mg/dL — ABNORMAL HIGH (ref 70–99)

## 2020-04-21 NOTE — Consult Note (Addendum)
ELECTROPHYSIOLOGY CONSULT NOTE  Patient ID: Tony Schmitt MRN: [id number], DOB/AGE: [birth year]   Admit date: 04/15/2020 Date of Consult: 04/21/2020  Primary Physician: No primary care provider on file. Primary Cardiologist: none Reason for Consultation: Cryptogenic stroke ; recommendations regarding Implantable Loop Recorder, requested by Dr. Leonie Man  History of Present Illness Tony Schmitt was admitted on 04/15/2020 with L sided weakness, stroke.   PMHx includes: HTN, DM, HLD  Neurology noted: multiple bilateral infarcts - embolic - source unknown.  he has undergone workup for stroke including echocardiogram and carotid dopplers.  The patient has been monitored on telemetry which has demonstrated sinus rhythm with no arrhythmias.     Echocardiogram this admission demonstrated  04/16/2020 IMPRESSIONS   1. Left ventricular ejection fraction, by estimation, is 60 to 65%. The left ventricle has normal function. The left ventricle has no regional wall motion abnormalities. There is mild concentric left ventricular hypertrophy. Left ventricular diastolic function could not be evaluated.   2. Right ventricular systolic function is normal. The right ventricular size is normal. Tricuspid regurgitation signal is inadequate for assessing PA pressure.   3. The mitral valve is normal in structure. No evidence of mitral valve regurgitation. No evidence of mitral stenosis.   4. The aortic valve is normal in structure. Aortic valve regurgitation is not visualized. No aortic stenosis is present.   5. The inferior vena cava is normal in size with greater than 50% respiratory variability, suggesting right atrial pressure of 3 mmHg.   TEE IMPRESSIONS  1. Left ventricular ejection fraction, by estimation, is 60 to 65%. The  left ventricle has normal function. The left ventricle has no regional  wall motion abnormalities.   2. Right ventricular systolic function is normal. The right ventricular  size is  normal.   3. No left atrial/left atrial appendage thrombus was detected.   4. The mitral valve is normal in structure. Trivial mitral valve  regurgitation. No evidence of mitral stenosis.   5. The aortic valve is normal in structure. Aortic valve regurgitation is  not visualized. No aortic stenosis is present.   6. The inferior vena cava is normal in size with greater than 50%  respiratory variability, suggesting   7. Possible very small PFO noted with agitated saline contrast study with  late bubbles after 7 cardiac cycles and Valsalva.   Conclusion(s)/Recommendation(s): Normal biventricular function without  evidence of hemodynamically significant valvular heart disease.   04/18/2020: LE venous doppler Summary:  BILATERAL:  - No evidence of deep vein thrombosis seen in the lower extremities,  bilaterally.  - No evidence of superficial venous thrombosis in the lower extremities,  bilaterally.    04/18/2020; TCD Summary:  No HITS at rest or during Valsalva. Negative transcranial Doppler Bubble  study with no evidence of right to left intracardiac communication.      A vascular evaluation was performed. The right middle cerebral artery was  studied. An IV was inserted into the patient's right forearm . Verbal  informed consent was obtained.     Negative TCD Bubble study  *See table(s) above for TCD measurements and observations.    Lab work is reviewed.   Prior to admission, the patient denies chest pain, shortness of breath, dizziness, palpitations, or syncope.  They are recovering from their stroke with plans to CIR at discharge.    Past Medical History:  Diagnosis Date   Diabetes mellitus without complication ([place])    Hypertension      Surgical History:  Past Surgical History:  Procedure Laterality Date   BUBBLE STUDY  04/18/2020   Procedure: BUBBLE STUDY;  Surgeon: Sueanne Margarita, MD;  Location: [facility] ENDOSCOPY;  Service: Cardiovascular;;   TEE WITHOUT  CARDIOVERSION N/A 04/18/2020   Procedure: TRANSESOPHAGEAL ECHOCARDIOGRAM (TEE);  Surgeon: Sueanne Margarita, MD;  Location: [facility] ENDOSCOPY;  Service: Cardiovascular;  Laterality: N/A;     Medications Prior to Admission  Medication Sig Dispense Refill Last Dose   glipiZIDE (GLUCOTROL) 5 MG tablet TAKE 1 TABLET BY MOUTH TWICE DAILY BEFORE A MEAL 60 tablet 0 04/15/2020 at Unknown time   lisinopril (ZESTRIL) 20 MG tablet Take 1 tablet (20 mg total) by mouth daily. 90 tablet 0 04/15/2020 at Unknown time   amLODipine (NORVASC) 10 MG tablet Take 1 tablet (10 mg total) by mouth daily. 30 tablet 3    metFORMIN (GLUCOPHAGE) 1000 MG tablet Take 1 tablet (1,000 mg total) by mouth 2 (two) times daily with a meal. 180 tablet 3    omeprazole (PRILOSEC) 40 MG capsule Take 1 capsule (40 mg total) by mouth daily. 30 capsule 3    ondansetron (ZOFRAN ODT) 4 MG disintegrating tablet Take 1 tablet (4 mg total) by mouth every 8 (eight) hours as needed for nausea or vomiting. (Patient not taking: Reported on 08/19/2019) 20 tablet 0    rosuvastatin (CRESTOR) 20 MG tablet Take 1 tablet (20 mg total) by mouth daily. 90 tablet 3     Inpatient Medications:    stroke: mapping our early stages of recovery book   Does not apply Once   amLODipine  10 mg Oral Daily   aspirin  81 mg Oral Daily   atorvastatin  80 mg Oral QHS   enoxaparin (LOVENOX) injection  30 mg Subcutaneous Q24H   insulin aspart  0-9 Units Subcutaneous TID WC   insulin glargine  10 Units Subcutaneous Daily   pantoprazole  40 mg Oral Daily   polyethylene glycol  17 g Oral Daily   sodium bicarbonate  1,300 mg Oral BID    Allergies: No Known Allergies  Social History   Socioeconomic History   Marital status: Single    Spouse name: Not on file   Number of children: Not on file   Years of education: Not on file   Highest education level: Not on file  Occupational History   Not on file  Tobacco Use   Smoking status: Former Smoker    Types: Cigarettes,  Cigars   Smokeless tobacco: Never Used  Scientific laboratory technician Use: Never used  Substance and Sexual Activity   Alcohol use: Not Currently   Drug use: Never   Sexual activity: Not on file  Other Topics Concern   Not on file  Social History Narrative   Not on file   Social Determinants of Health   Financial Resource Strain:    Difficulty of Paying Living Expenses: Not on file  Food Insecurity:    Worried About Charity fundraiser in the Last Year: Not on file   [employer] of Food in the Last Year: Not on file  Transportation Needs:    Lack of Transportation (Medical): Not on file   Lack of Transportation (Non-Medical): Not on file  Physical Activity:    Days of Exercise per Week: Not on file   Minutes of Exercise per Session: Not on file  Stress:    Feeling of Stress : Not on file  Social Connections:    Frequency of Communication with  Friends and Family: Not on file   Frequency of Social Gatherings with Friends and Family: Not on file   Attends Religious Services: Not on Electrical engineer or Organizations: Not on file   Attends Archivist Meetings: Not on file   Marital Status: Not on file  Intimate Partner Violence:    Fear of Current or Ex-Partner: Not on file   Emotionally Abused: Not on file   Physically Abused: Not on file   Sexually Abused: Not on file     Family History  Problem Relation Age of Onset   Diabetes Mother    Diabetes Father    Hypertension Father    Cancer Father    Heart failure Other       Review of Systems: All other systems reviewed and are otherwise negative except as noted above.  Physical Exam: Vitals:   04/20/20 2333 04/21/20 0309 04/21/20 0836 04/21/20 1218  BP: (!) 166/94 (!) 163/85 (!) 174/85 (!) 180/91  Pulse: 87 83 87 91  Resp: 18 16 17 19   Temp: 98.3 F (36.8 C) 98.7 F (37.1 C) 98.6 F (37 C) 98.9 F (37.2 C)  TempSrc: Oral Oral Oral Oral  SpO2: 99% 100% 100% 99%  Weight:      Height:         GEN- The patient is well appearing, alert and oriented x 3 today.   Head- normocephalic, atraumatic Eyes-  Sclera clear, conjunctiva pink Ears- hearing intact Oropharynx- clear Neck- supple Lungs- CTA b/l, normal work of breathing Heart- RRR, no murmurs, rubs or gallops  GI- soft, NT, ND Extremities- no clubbing, cyanosis, or edema MS- no significant deformity or atrophy Skin- no rash or lesion Psych- euthymic mood, full affect   Labs:   Lab Results  Component Value Date   WBC 9.2 04/21/2020   HGB 9.6 (L) 04/21/2020   HCT 30.0 (L) 04/21/2020   MCV 84.3 04/21/2020   PLT 386 04/21/2020    Recent Labs  Lab 04/16/20 0558 04/17/20 0824 04/21/20 0950  NA 142   < > 137  K 3.6   < > 3.7  CL 108   < > 109  CO2 24   < > 21*  BUN 31*   < > 30*  CREATININE 2.70*   < > 2.92*  CALCIUM 9.1   < > 8.7*  PROT 5.4*  --   --   BILITOT 0.6  --   --   ALKPHOS 88  --   --   ALT 16  --   --   AST 20  --   --   GLUCOSE 159*   < > 210*   < > = values in this interval not displayed.   No results found for: CKTOTAL, CKMB, CKMBINDEX, TROPONINI Lab Results  Component Value Date   CHOL 699 (H) 04/16/2020   CHOL 683 (H) 04/16/2020   CHOL 382 (H) 08/19/2019   Lab Results  Component Value Date   HDL 40 (L) 04/16/2020   HDL 41 04/16/2020   HDL 116 08/19/2019   Lab Results  Component Value Date   LDLCALC 599 (H) 04/16/2020   LDLCALC 584 (H) 04/16/2020   LDLCALC 209 (H) 08/19/2019   Lab Results  Component Value Date   TRIG 298 (H) 04/16/2020   TRIG 291 (H) 04/16/2020   TRIG 292 (H) 08/19/2019   Lab Results  Component Value Date   CHOLHDL 17.5 04/16/2020   CHOLHDL 16.7  04/16/2020   CHOLHDL 3.3 08/19/2019   No results found for: LDLDIRECT  No results found for: DDIMER   Radiology/Studies:   CT HEAD WO CONTRAST Result Date: 04/16/2020 CLINICAL DATA:  Acute onset left-sided weakness for 1 day. Suspected stroke. EXAM: CT HEAD WITHOUT CONTRAST TECHNIQUE: Contiguous axial  images were obtained from the base of the skull through the vertex without intravenous contrast. COMPARISON:  None. FINDINGS: Brain: Low-attenuation change in the right periventricular white matter suggesting acute infarct or possibly a mass lesion. Consider MRI for further evaluation. Old lacunar infarcts suggested in the left basal ganglia. No mass effect or midline shift. No abnormal extra-axial fluid collections. Gray-white matter junctions are distinct. Basal cisterns are not effaced. No acute intracranial hemorrhage. Vascular: No hyperdense vessel or unexpected calcification. Skull: Calvarium appears intact. Sinuses/Orbits: Paranasal sinuses are clear. Hypoaeration of the mastoid air cells with small mastoid effusions. Mucosal thickening in the right middle ear possibly representing inflammatory process or cholesteatoma. Other: None. IMPRESSION: 1. Low-attenuation change in the right periventricular white matter suggesting acute infarct or possibly a mass lesion. Consider MRI for further evaluation. 2. Old lacunar infarcts in the left basal ganglia. 3. No acute intracranial hemorrhage. 4. Hypoaeration of the mastoid air cells with small mastoid effusions. Mucosal thickening in the right middle ear possibly representing inflammatory process or cholesteatoma. Electronically Signed   By: Lucienne Capers M.D.   On: 04/16/2020 01:02    MR ANGIO HEAD WO CONTRAST Result Date: 04/16/2020 CLINICAL DATA:  New onset of left upper extremity weakness. Stroke symptoms have progressed significantly overnight. EXAM: MRI HEAD WITHOUT CONTRAST MRA HEAD WITHOUT CONTRAST MRA NECK WITHOUT CONTRAST TECHNIQUE: Multiplanar, multiecho pulse sequences of the brain and surrounding structures were obtained without intravenous contrast. Angiographic images of the Circle of Willis were obtained using MRA technique without intravenous contrast. Angiographic images of the neck were obtained using MRA technique without intravenous  contrast. Carotid stenosis measurements (when applicable) are obtained utilizing NASCET criteria, using the distal internal carotid diameter as the denominator. COMPARISON:  CT head without contrast 04/16/2020 FINDINGS: MRI HEAD FINDINGS Brain: The diffusion-weighted images confirm acute/subacute nonhemorrhagic infarcts involving the posterior right corona radiata. White matter infarct in the anterior aspect of the left external capsule, just anterior to the lentiform nucleus measures up to 13 mm. Punctate cortical infarct is present along the inferior left frontal operculum. T2 signal changes are associated with each of these areas. More remote lacunar infarct is present at the left globus pallidus. The basal ganglia are otherwise intact Periventricular and subcortical T2 signal changes are otherwise mildly advanced for age. The internal auditory canals are within normal limits. The brainstem and cerebellum are within normal limits. Vascular: The distal left vertebral artery is not visualized. Flow is otherwise present in the major intracranial arteries. Skull and upper cervical spine: Craniocervical junction is normal. Flow is present in the vertebral arteries bilaterally. Visualized intracranial contents are normal. Sinuses/Orbits: Right greater than left mastoid effusion is present. The paranasal sinuses and mastoid air cells are otherwise clear. The globes and orbits are within normal limits. MRA HEAD FINDINGS Atherosclerotic irregularity is present in the cavernous internal carotid arteries bilaterally. 50-60% stenosis is present in the supraclinoid right ICA. No other significant stenoses are present through the ICA termini. The anterior communicating artery is patent. MCA bifurcations are intact. Asymmetric attenuation of distal left MCA branch vessels is noted without a significant proximal stenosis or occlusion. No flow signal is present in the left vertebral artery. Moderate to  high-grade stenosis is  present at the junction at the V3 and V4 segments with segmental narrowing of the V4 segment above this level. Moderate proximal basilar artery stenosis is present. Basilar artery is small, terminating at the superior cerebellar arteries. Both posterior cerebral arteries are of fetal type. Moderate stenoses are present in the right posterior communicating artery with asymmetric attenuation of distal PCA branch vessels on the right. MRA NECK FINDINGS Time of flight imaging demonstrates common origin of the left common carotid artery and innominate artery. No significant proximal stenoses are present. MCA bifurcations are intact without significant flow disturbance. Flow is antegrade in the vertebral arteries bilaterally. The right vertebral artery is the dominant vessel. No flow is present in the distal left vertebral artery above the craniocervical junction. IMPRESSION: 1. Acute/subacute nonhemorrhagic infarcts involving the posterior right corona radiata and anterior aspect of the left external capsule, corresponding with patient's symptoms. Right-sided infarcts likely impacts cortical spinal tracts involving left motor function. 2. Punctate cortical infarct along the inferior left frontal operculum. 3. Periventricular and subcortical T2 signal changes are otherwise mildly advanced for age. This likely reflects the sequela of chronic microvascular ischemia. 4. No flow signal in the distal left vertebral artery above the craniocervical junction. 5. Moderate to high-grade stenosis at the junction at the right V3 and V4 segments with segmental narrowing of the V4 segment above this level. 6. Moderate proximal basilar artery stenosis. 7. Atherosclerotic irregularity in the cavernous internal carotid arteries bilaterally with a 50-60% stenosis in the supraclinoid right ICA. 8. Moderate stenoses of the right posterior communicating artery with asymmetric attenuation of distal PCA branch vessels on the right. The right  posterior cerebral artery is of fetal type. 9. No significant proximal stenosis in the neck. Electronically Signed   By: San Morelle M.D.   On: 04/16/2020 12:19    VAS US CAROTID (at [facility] and [facility] only) Result Date: 04/19/2020 Carotid Arterial Duplex Study Indications:       CVA. Risk Factors:      Hypertension, Diabetes. Comparison Study:  no prior Performing Technologist: Abram Sander RVS  Examination Guidelines: A complete evaluation includes B-mode imaging, spectral Doppler, color Doppler, and power Doppler as needed of all accessible portions of each vessel. Bilateral testing is considered an integral part of a complete examination. Limited examinations for reoccurring indications may be performed as noted.  Right Carotid Findings: rtuous  Summary: Right Carotid: Velocities in the right ICA are consistent with a 1-39% stenosis. Left Carotid: Velocities in the left ICA are consistent with a 1-39% stenosis. Vertebrals: Bilateral vertebral arteries demonstrate antegrade flow. *See table(s) above for measurements and observations.  Electronically signed by Antony Contras MD on 04/19/2020 at 12:33:14 PM.    Final      12-lead ECG SR All prior EKG's in EPIC reviewed with no documented atrial fibrillation  Telemetry SR  Assessment and Plan:  1. Cryptogenic stroke The patient presents with cryptogenic stroke.    I spoke at length with the patient about monitoring for afib with either a 30 day event monitor or an implantable loop recorder.   Unfortunately he is currently without health insurance. I discussed with him that there is a out patient monitoring associated with the loop implant that Kerstie Agent be associated with some cost  He is most comfortable waiting to see if he is able to get health care coverage prior to committing to loop implant.  I have made out patient follow up with our service in a few  weeks to follow up. Should he while in rehab get medicaid/coverage please let us know and we  can arrnage implant once discharged from rehab as well.  Please call with questions.   Baldwin Jamaica, PA-C 04/21/2020  I have seen and examined this patient with Tommye Standard.  Agree with above, note added to reflect my findings.  On exam, RRR, no murmurs.  Patient presented to the hospital with cryptogenic stroke. To date, no cause has been found. Would benefit from [facility] monitor implant. Patient has no insurance and thus Maxx Calaway follow up in clinic in 4 weeks which Jaquasha [place] hopefully give him a chance to obtain insurance.  Dahlia Nifong M. Jestin Burbach MD 04/21/2020 3:40 PM

## 2020-04-21 NOTE — Progress Notes (Signed)
Physical Therapy Treatment Patient Details Name: Tony Schmitt MRN: [id number] DOB: 04/15/1971 Today's Date: 04/21/2020    History of Present Illness 49 y.o. male with history of HTN, DM, CKD stage III presents to the ER with left-sided weakness. CT head- acute right periventricular white matter infarct. MRI brain -acute to subacute nonhemorrhagic infarcts in the posterior right corona radiata and anterior aspect of left external capsule.    PT Comments    Patient very motivated. After standing/weight-bearing activities, pt much better able to activate LLE muscles in isolation. Patient able to maintain control of left knee with max cues (not buckling or hyperextending during pre-gait activities). Patient attempts to advance LLE with hip hike and adductors and required facilitation to use knee flexion.   Follow Up Recommendations  CIR     Equipment Recommendations  Other (comment) (TBD)    Recommendations for Other Services Rehab consult     Precautions / Restrictions Precautions Precautions: Fall Precaution Comments: left hemiparesis    Mobility  Bed Mobility Overal bed mobility: Needs Assistance Bed Mobility: Supine to Sit     Supine to sit: Mod assist;HOB elevated     General bed mobility comments: pt allowed to choose his technique for coming to sit, he advanced RLE over EOB, requested assist with his LLE, required cues to bring his LUE across his body (and to grab UE by his forearm), cues and assist to push up towards sitting with RUE  Transfers Overall transfer level: Needs assistance Equipment used: Rolling walker (2 wheeled);None Transfers: Sit to/from Stand Sit to Stand: Mod assist;Min assist (mod initially, progressed to min with reps)         General transfer comment: cues for hand placement each transfer; poor mechanics initially, improved with repetition to need min assist  Ambulation/Gait Ambulation/Gait assistance: Mod assist Gait Distance (Feet): 15  Feet Assistive device: Rolling walker (2 wheeled) Gait Pattern/deviations: Step-to pattern;Decreased step length - left;Decreased stance time - left;Decreased stride length;Decreased dorsiflexion - left;Decreased weight shift to left Gait velocity: decreased   General Gait Details: Patient advancing LLE via use of adductors with LLE in external rotation and "sliding" foot along the floor then pressing left knee into hyperextension for weight-bearing and advancing RLE; facilitated to advance LLE with knee flexion, not lock into hyperextension (he does not buckle), and prevent left hip retraction   Stairs             Wheelchair Mobility    Modified Rankin (Stroke Patients Only) Modified Rankin (Stroke Patients Only) Pre-Morbid Rankin Score: No symptoms Modified Rankin: Moderately severe disability     Balance Overall balance assessment: Needs assistance Sitting-balance support: Feet supported Sitting balance-Leahy Scale: Poor Sitting balance - Comments: UE supported sitting and stability; vc and incr time to scoot left hip forward.   Standing balance support: During functional activity Standing balance-Leahy Scale: Poor Standing balance comment: Assist or RW and external support               High Level Balance Comments: keeps weight shifted to stronger right side to balance            Cognition Arousal/Alertness: Awake/alert Behavior During Therapy: Flat affect Overall Cognitive Status: Impaired/Different from baseline Area of Impairment: Awareness;Problem solving                           Awareness: Emergent Problem Solving: Requires verbal cues;Requires tactile cues General Comments: had difficulty problem-solving which LE to advance  first (after cued several times by PT); could not problem-solve best place to put chair (L vs R)  for safe transfer      Exercises Other Exercises Other Exercises: seated attempted AROM LLE with no activation (even  after PROM) however after weightbearing, pt able to activate knee extensors, knee flexors, and hip flexors for AAROM Other Exercises: standing mini-squats with RUE support; pt able to control slow flexion of both knees and with max cues engage Lt quads/hip extensors to return to up tall; 5 with feet shoulder-width; 5 with LLE advanced forward 'in stride" with weight shift incorporated    General Comments        Pertinent Vitals/Pain Pain Assessment: No/denies pain    Home Living                      Prior Function            PT Goals (current goals can now be found in the care plan section) Acute Rehab PT Goals Patient Stated Goal: To improve strength Time For Goal Achievement: 04/30/20 Potential to Achieve Goals: Good Progress towards PT goals: Progressing toward goals    Frequency    Min 4X/week      PT Plan Current plan remains appropriate    Co-evaluation              AM-PAC PT "6 Clicks" Mobility   Outcome Measure  Help needed turning from your back to your side while in a flat bed without using bedrails?: A Little Help needed moving from lying on your back to sitting on the side of a flat bed without using bedrails?: A Lot Help needed moving to and from a bed to a chair (including a wheelchair)?: A Lot Help needed standing up from a chair using your arms (e.g., wheelchair or bedside chair)?: A Lot Help needed to walk in hospital room?: A Lot Help needed climbing 3-5 steps with a railing? : Total 6 Click Score: 12    End of Session Equipment Utilized During Treatment: Gait belt Activity Tolerance: Patient tolerated treatment well Patient left: with call bell/phone within reach;in chair;with chair alarm set;with family/visitor present Nurse Communication: Mobility status;Other (comment) (feet on floor, mother is in the room) PT Visit Diagnosis: Other abnormalities of gait and mobility (R26.89);Hemiplegia and hemiparesis Hemiplegia - Right/Left:  Left Hemiplegia - dominant/non-dominant: Non-dominant Hemiplegia - caused by: Cerebral infarction     Time: 3419-3790 PT Time Calculation (min) (ACUTE ONLY): 45 min  Charges:  $Gait Training: 8-22 mins $Neuromuscular Re-education: 23-37 mins                      Arby Barrette, PT Pager [phone]    Rexanne Mano 04/21/2020, 5:23 PM

## 2020-04-21 NOTE — Progress Notes (Signed)
PROGRESS NOTE    Tony Schmitt  ZOX:[id number] DOB: [birth year] DOA: 04/15/2020 PCP: No primary care provider on file.   Brief Narrative: 49 year old PMH hypertension, diabetes, CKD stage IIIa who presents to the ED with left-sided weakness. Patient weakness a started on September 04/2020 evening but it was mild. He wake up on 9/10 with worsening weakness. He was not able to stand up from the floor and call his mom. Evaluation in the ED CT head showed low-attenuation changes in the right periventricular white matter concerning for acute infarct versus mass lesion. Systolic blood pressure markedly elevated more than 200.   Assessment & Plan:   Principal Problem:   Acute CVA (cerebrovascular accident) ([place]) Active Problems:   Dyslipidemia associated with type 2 diabetes mellitus (HCC)   CKD (chronic kidney disease) stage 3, GFR 30-59 ml/min   Hypertensive urgency   Type 2 diabetes mellitus with vascular disease ([place])   Acute CVA, involve posterior right corona and anterior aspect of left external capsule. -MRI; acute subacute nonhemorrhagic infarct involving posterior right corona radiata  and anterior aspect of the left external capsule. Right-sided infarct likely impact corticospinal tracts involving left  motor function. Punctate cortical infarct along the inferior left frontal operculum.  -MRA; moderate to high-grade stenosis at the junction of the right V3 and V4 segment with segmental narrowing of the V4 segment above this level. Moderate proximal basilar artery stenosis. Atherosclerotic irregularity in the cavernous internal carotid arteries bilaterally with 50 to 60% stenosis of the supraclinoid right ICA. Moderate stenosis of the right posterior communicating artery with asymmetric attenuation of distal PCA branch vessel on the right. Patient remains on aspirin and statin.  Patient underwent TEE which was negative for thrombus.  Possible small PFO noted.  Lower extremity Dopplers  negative for DVT. ANA is negative.  Anticardiolipin antibodies within normal range. A loop recorder was recommended by neurology.  Discussed with the electrophysiology.  They will discuss further with neurology due to some follow-up issues. Patient remained stable from a neurological standpoint.  Acute on CKD stage IIIa/normal anion gap metabolic acidosis; Baseline creatinine is around 1.8.  Presented with a creatinine of 3.5.   Patient was started on IV fluids with improvement in his creatinine.   Lab results pending from today.  Patient also on sodium bicarbonate for metabolic acidosis.   Continue to monitor urine output.  Hyperlipidemia;  LDL 584. Started on lipitor.  Recheck labs in a few weeks.  Diabetes Type 2;  Hb-A1c; 9.7 On low dose lantus.  Needs to continue to hold metformin due to CKD CBGs are reasonably well controlled.  Potassium abnormalities: Labs are pending from today.  Patient has had episodes of hyperkalemia as well as hypokalemia.    Leukocytosis Etiology unclear.  Noted to be normal today.  Normocytic anemia No evidence for overt blood loss..  Likely due to chronic disease.  Hemoglobin is stable.  Essential hypertension Initially allowed permissive hypertension.  Plan to control his blood pressure gradually.  He was started on amlodipine.  Was on same at home.  Amlodipine was increased to 10 mg daily.  Blood pressure is stable.  Patient was also on lisinopril at home which has been held due to renal failure.  Left Arm swelling;  No DVT noted  Estimated body mass index is 27.7 kg/m as calculated from the following:   Height as of this encounter: 5\' 4"  (1.626 m).   Weight as of this encounter: 73.2 kg.   DVT prophylaxis:  Lovenox Code Status: Full code Family Communication: No family at bedside Disposition Plan: Plan is for him to go to inpatient rehabilitation.  Informed that he may have a bed available tomorrow.  Status is: Inpatient  Remains  inpatient appropriate because:Ongoing diagnostic testing needed not appropriate for outpatient work up   Dispo: The patient is from: Home              Anticipated d/c is to: Inpatient rehabilitation when bed is available              Anticipated d/c date is: 9/17              Patient currently is medically stable to d/c.     Consultants:   Neurology   Procedures:   ECHO; normal EF, no evidence of thrombus  TEE  Antimicrobials: None currently   Subjective: Patient denies any complaints.  Continues to have weakness in the left side with no appreciable improvement.     Objective: Vitals:   04/20/20 2019 04/20/20 2333 04/21/20 0309 04/21/20 0836  BP: (!) 156/87 (!) 166/94 (!) 163/85 (!) 174/85  Pulse: 84 87 83 87  Resp: 17 18 16 17   Temp: 98.4 F (36.9 C) 98.3 F (36.8 C) 98.7 F (37.1 C) 98.6 F (37 C)  TempSrc: Oral Oral Oral Oral  SpO2: 100% 99% 100% 100%  Weight:      Height:        Intake/Output Summary (Last 24 hours) at 04/21/2020 1026 Last data filed at 04/21/2020 0838 Gross per 24 hour  Intake 900 ml  Output 1850 ml  Net -950 ml   Filed Weights   04/16/20 2145  Weight: 73.2 kg    Examination:  General appearance: Awake alert.  In no distress Resp: Clear to auscultation bilaterally.  Normal effort Cardio: S1-S2 is normal regular.  No S3-S4.  No rubs murmurs or bruit GI: Abdomen is soft.  Nontender nondistended.  Bowel sounds are present normal.  No masses organomegaly Extremities: No edema.   Neurologic: Left-sided weakness noted.  No changes compared to yesterday.   Data Reviewed: I have personally reviewed following labs and imaging studies  CBC: Recent Labs  Lab 04/16/20 0045 04/16/20 0558 04/17/20 0824 04/18/20 0910 04/21/20 0950  WBC 12.0* 12.1* 10.8* 10.8* 9.2  NEUTROABS 9.5*  --   --   --   --   HGB 10.7* 10.9* 10.6* 9.4* 9.6*  HCT 33.3* 34.5* 33.1* 29.5* 30.0*  MCV 85.4 85.8 84.7 85.0 84.3  PLT 455* 459* 434* 426* 324    Basic Metabolic Panel: Recent Labs  Lab 04/16/20 0558 04/17/20 0824 04/17/20 1450 04/18/20 0910 04/19/20 0843  NA 142 136 137 140 136  K 3.6 5.7* 3.3* 3.2* 3.4*  CL 108 110 108 112* 108  CO2 24 16* 22 20* 21*  GLUCOSE 159* 107* 132* 93 139*  BUN 31* 30* 29* 31* 32*  CREATININE 2.70* 3.08* 3.01* 3.06* 2.93*  CALCIUM 9.1 9.1 9.0 8.9 8.6*  MG  --  1.8  --   --   --    GFR: Estimated Creatinine Clearance: 27.6 mL/min (A) (by C-G formula based on SCr of 2.93 mg/dL (H)). Liver Function Tests: Recent Labs  Lab 04/16/20 0045 04/16/20 0558  AST 21 20  ALT 18 16  ALKPHOS 82 88  BILITOT 0.6 0.6  PROT 5.4* 5.4*  ALBUMIN 1.9* 1.8*   Coagulation Profile: Recent Labs  Lab 04/16/20 0021  INR 1.0   CBG: Recent Labs  Lab 04/19/20 2058 04/20/20 0614 04/20/20 1210 04/20/20 2110 04/21/20 0611  GLUCAP 169* 127* 136* 147* 142*     Recent Results (from the past 240 hour(s))  SARS Coronavirus 2 by RT PCR (hospital order, performed in [facility] hospital lab) Nasopharyngeal Nasopharyngeal Swab     Status: None   Collection Time: 04/16/20  1:30 AM   Specimen: Nasopharyngeal Swab  Result Value Ref Range Status   SARS Coronavirus 2 NEGATIVE NEGATIVE Final    Comment: (NOTE) SARS-CoV-2 target nucleic acids are NOT DETECTED.  The SARS-CoV-2 RNA is generally detectable in upper and lower respiratory specimens during the acute phase of infection. The lowest concentration of SARS-CoV-2 viral copies this assay can detect is 250 copies / mL. A negative result does not preclude SARS-CoV-2 infection and should not be used as the sole basis for treatment or other patient management decisions.  A negative result may occur with improper specimen collection / handling, submission of specimen other than nasopharyngeal swab, presence of viral mutation(s) within the areas targeted by this assay, and inadequate number of viral copies (<250 copies / mL). A negative result must be combined  with clinical observations, patient history, and epidemiological information.  Fact Sheet for Patients:   StrictlyIdeas.no  Fact Sheet for Healthcare Providers: BankingDealers.co.za  This test is not yet approved or  cleared by the [place] FDA and has been authorized for detection and/or diagnosis of SARS-CoV-2 by FDA under an Emergency Use Authorization (EUA).  This EUA will remain in effect (meaning this test can be used) for the duration of the COVID-19 declaration under Section 564(b)(1) of the Act, 21 U.S.C. section 360bbb-3(b)(1), unless the authorization is terminated or revoked sooner.  Performed at [facility], [place] [address], [place], [place] [address]   Urine Culture     Status: Abnormal   Collection Time: 04/17/20  7:24 AM   Specimen: Urine, Random  Result Value Ref Range Status   Specimen Description URINE, RANDOM  Final   Special Requests   Final    NONE Performed at [facility], [place] [address], [place], [place] [address]    Culture MULTIPLE SPECIES PRESENT, SUGGEST RECOLLECTION (A)  Final   Report Status 04/18/2020 FINAL  Final         Radiology Studies: No results found.      Scheduled Meds: .  stroke: mapping our early stages of recovery book   Does not apply Once  . amLODipine  10 mg Oral Daily  . aspirin  81 mg Oral Daily  . atorvastatin  80 mg Oral QHS  . enoxaparin (LOVENOX) injection  30 mg Subcutaneous Q24H  . insulin aspart  0-9 Units Subcutaneous TID WC  . insulin glargine  10 Units Subcutaneous Daily  . pantoprazole  40 mg Oral Daily  . polyethylene glycol  17 g Oral Daily  . sodium bicarbonate  1,300 mg Oral BID   Continuous Infusions: . sodium chloride 50 mL/hr at 04/19/20 1752     LOS: 5 days       Bonnielee Haff, MD [facility]   If 7PM-7AM, please contact night-coverage [email or web address]  04/21/2020, 10:26 AM

## 2020-04-21 NOTE — Progress Notes (Signed)
Inpatient Rehabilitation Admissions Coordinator  Cir bed is available to admit patient on Friday. I have alerted Dr. Maryland Pink, acute team and TOC. Noted need for LOOP placement prior to d/c to CIR on Friday.  Danne Baxter, RN, MSN Rehab Admissions Coordinator [phone] 04/21/2020 10:02 AM

## 2020-04-22 ENCOUNTER — Encounter (HOSPITAL_COMMUNITY): Payer: Self-pay | Admitting: Physical Medicine & Rehabilitation

## 2020-04-22 ENCOUNTER — Inpatient Hospital Stay (HOSPITAL_COMMUNITY)
Admission: RE | Admit: 2020-04-22 | Discharge: 2020-05-12 | DRG: 057 | Disposition: A | Discharge status: Home / Self Care | Payer: Self-pay | Source: Intra-hospital | Attending: Physical Medicine & Rehabilitation | Admitting: Physical Medicine & Rehabilitation

## 2020-04-22 ENCOUNTER — Other Ambulatory Visit: Payer: Self-pay

## 2020-04-22 DIAGNOSIS — I6300 Cerebral infarction due to thrombosis of unspecified precerebral artery: Secondary | ICD-10-CM

## 2020-04-22 DIAGNOSIS — E785 Hyperlipidemia, unspecified: Secondary | ICD-10-CM | POA: Diagnosis present

## 2020-04-22 DIAGNOSIS — IMO0002 Reserved for concepts with insufficient information to code with codable children: Secondary | ICD-10-CM

## 2020-04-22 DIAGNOSIS — E46 Unspecified protein-calorie malnutrition: Secondary | ICD-10-CM

## 2020-04-22 DIAGNOSIS — Z8249 Family history of ischemic heart disease and other diseases of the circulatory system: Secondary | ICD-10-CM

## 2020-04-22 DIAGNOSIS — E1165 Type 2 diabetes mellitus with hyperglycemia: Secondary | ICD-10-CM

## 2020-04-22 DIAGNOSIS — D62 Acute posthemorrhagic anemia: Secondary | ICD-10-CM

## 2020-04-22 DIAGNOSIS — E1169 Type 2 diabetes mellitus with other specified complication: Secondary | ICD-10-CM

## 2020-04-22 DIAGNOSIS — I69354 Hemiplegia and hemiparesis following cerebral infarction affecting left non-dominant side: Principal | ICD-10-CM

## 2020-04-22 DIAGNOSIS — K2100 Gastro-esophageal reflux disease with esophagitis, without bleeding: Secondary | ICD-10-CM

## 2020-04-22 DIAGNOSIS — I639 Cerebral infarction, unspecified: Secondary | ICD-10-CM | POA: Diagnosis present

## 2020-04-22 DIAGNOSIS — N1831 Chronic kidney disease, stage 3a: Secondary | ICD-10-CM

## 2020-04-22 DIAGNOSIS — I10 Essential (primary) hypertension: Secondary | ICD-10-CM

## 2020-04-22 DIAGNOSIS — Z79899 Other long term (current) drug therapy: Secondary | ICD-10-CM

## 2020-04-22 DIAGNOSIS — E1159 Type 2 diabetes mellitus with other circulatory complications: Secondary | ICD-10-CM

## 2020-04-22 DIAGNOSIS — Z720 Tobacco use: Secondary | ICD-10-CM

## 2020-04-22 DIAGNOSIS — K5901 Slow transit constipation: Secondary | ICD-10-CM

## 2020-04-22 DIAGNOSIS — N1832 Chronic kidney disease, stage 3b: Secondary | ICD-10-CM | POA: Diagnosis present

## 2020-04-22 DIAGNOSIS — N179 Acute kidney failure, unspecified: Secondary | ICD-10-CM

## 2020-04-22 DIAGNOSIS — Z7982 Long term (current) use of aspirin: Secondary | ICD-10-CM

## 2020-04-22 DIAGNOSIS — Z9119 Patient's noncompliance with other medical treatment and regimen: Secondary | ICD-10-CM

## 2020-04-22 DIAGNOSIS — R531 Weakness: Secondary | ICD-10-CM

## 2020-04-22 DIAGNOSIS — I129 Hypertensive chronic kidney disease with stage 1 through stage 4 chronic kidney disease, or unspecified chronic kidney disease: Secondary | ICD-10-CM | POA: Diagnosis present

## 2020-04-22 DIAGNOSIS — E1122 Type 2 diabetes mellitus with diabetic chronic kidney disease: Secondary | ICD-10-CM | POA: Diagnosis present

## 2020-04-22 DIAGNOSIS — D72829 Elevated white blood cell count, unspecified: Secondary | ICD-10-CM

## 2020-04-22 DIAGNOSIS — D75839 Thrombocytosis, unspecified: Secondary | ICD-10-CM

## 2020-04-22 DIAGNOSIS — Z794 Long term (current) use of insulin: Secondary | ICD-10-CM

## 2020-04-22 DIAGNOSIS — Z809 Family history of malignant neoplasm, unspecified: Secondary | ICD-10-CM

## 2020-04-22 DIAGNOSIS — Z833 Family history of diabetes mellitus: Secondary | ICD-10-CM

## 2020-04-22 DIAGNOSIS — E8809 Other disorders of plasma-protein metabolism, not elsewhere classified: Secondary | ICD-10-CM

## 2020-04-22 LAB — CBC
HCT: 29.1 % — ABNORMAL LOW (ref 39.0–52.0)
HCT: 30.7 % — ABNORMAL LOW (ref 39.0–52.0)
Hemoglobin: 9.3 g/dL — ABNORMAL LOW (ref 13.0–17.0)
Hemoglobin: 9.6 g/dL — ABNORMAL LOW (ref 13.0–17.0)
MCH: 27 pg (ref 26.0–34.0)
MCH: 26.4 pg (ref 26.0–34.0)
MCHC: 32 g/dL (ref 30.0–36.0)
MCHC: 31.3 g/dL (ref 30.0–36.0)
MCV: 84.3 fL (ref 80.0–100.0)
MCV: 84.6 fL (ref 80.0–100.0)
Platelets: 393 10*3/uL (ref 150–400)
Platelets: 394 10*3/uL (ref 150–400)
RBC: 3.45 MIL/uL — ABNORMAL LOW (ref 4.22–5.81)
RBC: 3.63 MIL/uL — ABNORMAL LOW (ref 4.22–5.81)
RDW: 13.8 % (ref 11.5–15.5)
RDW: 13.7 % (ref 11.5–15.5)
WBC: 10.3 10*3/uL (ref 4.0–10.5)
WBC: 9.7 10*3/uL (ref 4.0–10.5)
nRBC: 0 % (ref 0.0–0.2)
nRBC: 0 % (ref 0.0–0.2)

## 2020-04-22 LAB — BASIC METABOLIC PANEL
Anion gap: 6 (ref 5–15)
BUN: 32 mg/dL — ABNORMAL HIGH (ref 6–20)
CO2: 23 mmol/L (ref 22–32)
Calcium: 8.9 mg/dL (ref 8.9–10.3)
Chloride: 109 mmol/L (ref 98–111)
Creatinine, Ser: 3.1 mg/dL — ABNORMAL HIGH (ref 0.61–1.24)
GFR calc Af Amer: 26 mL/min — ABNORMAL LOW (ref >60)
GFR calc non Af Amer: 22 mL/min — ABNORMAL LOW (ref >60)
Glucose, Bld: 156 mg/dL — ABNORMAL HIGH (ref 70–99)
Potassium: 3.7 mmol/L (ref 3.5–5.1)
Sodium: 138 mmol/L (ref 135–145)

## 2020-04-22 LAB — GLUCOSE, CAPILLARY
Glucose-Capillary: 112 mg/dL — ABNORMAL HIGH (ref 70–99)
Glucose-Capillary: 118 mg/dL — ABNORMAL HIGH (ref 70–99)
Glucose-Capillary: 156 mg/dL — ABNORMAL HIGH (ref 70–99)
Glucose-Capillary: 159 mg/dL — ABNORMAL HIGH (ref 70–99)

## 2020-04-22 LAB — CREATININE, SERUM
Creatinine, Ser: 3.21 mg/dL — ABNORMAL HIGH (ref 0.61–1.24)
GFR calc Af Amer: 25 mL/min — ABNORMAL LOW (ref >60)
GFR calc non Af Amer: 21 mL/min — ABNORMAL LOW (ref >60)

## 2020-04-22 MED ORDER — POLYETHYLENE GLYCOL 3350 17 G PO PACK
17.0000 g | PACK | Freq: Every day | ORAL | Status: DC
  Administered 2020-04-23 – 2020-04-26 (×4): 17 g via ORAL
  Filled 2020-04-22 (×4): qty 1

## 2020-04-22 MED ORDER — LIVING WELL WITH DIABETES BOOK
Freq: Once | Status: DC
  Filled 2020-04-22: qty 1

## 2020-04-22 MED ORDER — ACETAMINOPHEN 160 MG/5ML PO SOLN
650.0000 mg | ORAL | Status: DC | PRN
  Administered 2020-05-02: 650 mg
  Filled 2020-04-22: qty 20.3

## 2020-04-22 MED ORDER — INSULIN ASPART 100 UNIT/ML ~~LOC~~ SOLN
0.0000 [IU] | Freq: Three times a day (TID) | SUBCUTANEOUS | Status: DC
  Administered 2020-04-22: 2 [IU] via SUBCUTANEOUS
  Administered 2020-04-23 – 2020-04-24 (×4): 1 [IU] via SUBCUTANEOUS
  Administered 2020-04-25: 2 [IU] via SUBCUTANEOUS
  Administered 2020-04-25: 1 [IU] via SUBCUTANEOUS
  Administered 2020-04-26: 2 [IU] via SUBCUTANEOUS
  Administered 2020-04-27 (×2): 1 [IU] via SUBCUTANEOUS
  Administered 2020-04-27: 2 [IU] via SUBCUTANEOUS
  Administered 2020-04-28 – 2020-04-29 (×3): 1 [IU] via SUBCUTANEOUS
  Administered 2020-04-30: 2 [IU] via SUBCUTANEOUS
  Administered 2020-04-30 – 2020-05-04 (×7): 1 [IU] via SUBCUTANEOUS
  Administered 2020-05-05 (×2): 2 [IU] via SUBCUTANEOUS
  Administered 2020-05-06: 1 [IU] via SUBCUTANEOUS
  Administered 2020-05-06: 3 [IU] via SUBCUTANEOUS
  Administered 2020-05-07 (×2): 1 [IU] via SUBCUTANEOUS
  Administered 2020-05-08 – 2020-05-09 (×2): 2 [IU] via SUBCUTANEOUS
  Administered 2020-05-09 – 2020-05-10 (×3): 1 [IU] via SUBCUTANEOUS
  Administered 2020-05-11 (×2): 2 [IU] via SUBCUTANEOUS

## 2020-04-22 MED ORDER — ASPIRIN 81 MG PO CHEW
81.0000 mg | CHEWABLE_TABLET | Freq: Every day | ORAL | Status: DC
  Administered 2020-04-23 – 2020-05-12 (×20): 81 mg via ORAL
  Filled 2020-04-22 (×20): qty 1

## 2020-04-22 MED ORDER — ENOXAPARIN SODIUM 30 MG/0.3ML ~~LOC~~ SOLN
30.0000 mg | SUBCUTANEOUS | Status: DC
  Administered 2020-04-23 – 2020-05-07 (×15): 30 mg via SUBCUTANEOUS
  Filled 2020-04-22 (×15): qty 0.3

## 2020-04-22 MED ORDER — ACETAMINOPHEN 325 MG PO TABS
650.0000 mg | ORAL_TABLET | ORAL | Status: DC | PRN
  Administered 2020-05-01: 650 mg via ORAL
  Filled 2020-04-22: qty 2

## 2020-04-22 MED ORDER — SODIUM BICARBONATE 650 MG PO TABS
1300.0000 mg | ORAL_TABLET | Freq: Two times a day (BID) | ORAL | Status: DC
  Administered 2020-04-22 – 2020-05-12 (×40): 1300 mg via ORAL
  Filled 2020-04-22 (×40): qty 2

## 2020-04-22 MED ORDER — ACETAMINOPHEN 650 MG RE SUPP: 650.0000 mg | RECTAL | Status: DC | PRN

## 2020-04-22 MED ORDER — AMLODIPINE BESYLATE 10 MG PO TABS
10.0000 mg | ORAL_TABLET | Freq: Every day | ORAL | Status: DC
  Administered 2020-04-23 – 2020-05-12 (×20): 10 mg via ORAL
  Filled 2020-04-22 (×20): qty 1

## 2020-04-22 MED ORDER — PANTOPRAZOLE SODIUM 40 MG PO TBEC
40.0000 mg | DELAYED_RELEASE_TABLET | Freq: Every day | ORAL | Status: DC
  Administered 2020-04-23 – 2020-05-12 (×20): 40 mg via ORAL
  Filled 2020-04-22 (×20): qty 1

## 2020-04-22 MED ORDER — ENOXAPARIN SODIUM 30 MG/0.3ML ~~LOC~~ SOLN: 30.0000 mg | SUBCUTANEOUS | Status: DC

## 2020-04-22 MED ORDER — BLOOD PRESSURE CONTROL BOOK
Freq: Once | Status: DC
  Filled 2020-04-22: qty 1

## 2020-04-22 MED ORDER — ATORVASTATIN CALCIUM 80 MG PO TABS
80.0000 mg | ORAL_TABLET | Freq: Every day | ORAL | Status: DC
  Administered 2020-04-22 – 2020-05-11 (×20): 80 mg via ORAL
  Filled 2020-04-22 (×20): qty 1

## 2020-04-22 MED ORDER — INSULIN GLARGINE 100 UNIT/ML ~~LOC~~ SOLN
10.0000 [IU] | Freq: Every day | SUBCUTANEOUS | Status: DC
  Administered 2020-04-23: 10 [IU] via SUBCUTANEOUS
  Filled 2020-04-22 (×3): qty 0.1

## 2020-04-22 NOTE — H&P (Signed)
Physical Medicine and Rehabilitation Admission H&P    No chief complaint on file. : HPI: Tony Schmitt. Tony Schmitt is a 49 year old right-handed male with history of diabetes mellitus, tobacco abuse, CKD stage III with creatinine baseline 2.42 08/02/2018, hypertension as well as medical noncompliance.  History taken from chart review and patient.  Patient lives with his mother.  1 level home 2 steps to entry.  Independent prior to admission.  He presented on 04/15/2020 with left hemiparesis.  Blood pressure noted to be 176/109 CT/MRI as well as MRA of the head and neck showed acute subacute nonhemorrhagic posterior right corona radiata and anterior aspect of left external capsule infarct.  Punctate cortical infarct along the inferior left frontal operculum.  No flow signal in the distal left vertebral artery above the craniocervical junction.  Atherosclerotic irregularity of the cavernous internal carotid arteries bilaterally with 50-60% stenosis of the supraclinoid right ICA.  Carotid Dopplers with 1 to 39% ICA stenosis.  Patient did not receive TPA.  Admission chemistries alcohol negative, urine drug screen negative, creatinine 3.35, glucose 227, BUN 31, hemoglobin A1c 9.6.  Echocardiogram with ejection fraction of 60-65%.  No wall motion abnormalities.  Currently maintained on aspirin for CVA prophylaxis.  Subcutaneous Lovenox for DVT prophylaxis with lower extremity Dopplers negative for DVT.  TEE showed ejection fraction 65%, no evidence of thrombus possible very small PFO.  TCD study was completed negative for PFO.  No plans for loop recorder anymore.  Monitoring of renal function creatinine 3.10 maintained on IV fluids initially.  Tolerating a regular diet.  Therapy evaluations completed and patient was admitted for a comprehensive rehab program.  Please see preadmission assessment from earlier today as well.  Review of Systems  Constitutional: Positive for malaise/fatigue. Negative for chills and fever.   HENT: Negative for hearing loss.   Eyes: Negative for blurred vision and double vision.  Respiratory: Negative for cough and shortness of breath.   Cardiovascular: Negative for chest pain, palpitations and leg swelling.  Gastrointestinal: Positive for constipation. Negative for heartburn, nausea and vomiting.  Genitourinary: Negative for dysuria, flank pain and hematuria.  Musculoskeletal: Positive for myalgias.  Skin: Negative for rash.  Neurological: Positive for speech change, focal weakness and weakness. Negative for sensory change.  All other systems reviewed and are negative.  Past Medical History:  Diagnosis Date  . Diabetes mellitus without complication (Experiment)   . Hypertension    Past Surgical History:  Procedure Laterality Date  . BUBBLE STUDY  04/18/2020   Procedure: BUBBLE STUDY;  Surgeon: Sueanne Margarita, MD;  Location: [facility];  Service: Cardiovascular;;  . TEE WITHOUT CARDIOVERSION N/A 04/18/2020   Procedure: TRANSESOPHAGEAL ECHOCARDIOGRAM (TEE);  Surgeon: Sueanne Margarita, MD;  Location: [facility] ENDOSCOPY;  Service: Cardiovascular;  Laterality: N/A;   Family History  Problem Relation Age of Onset  . Diabetes Mother   . Diabetes Father   . Hypertension Father   . Cancer Father   . Heart failure Other    Social History:  reports that he has quit smoking. His smoking use included cigarettes and cigars. He has never used smokeless tobacco. He reports previous alcohol use. He reports that he does not use drugs. Allergies: No Known Allergies Medications Prior to Admission  Medication Sig Dispense Refill  . amLODipine (NORVASC) 5 MG tablet Take 1 tablet (5 mg total) by mouth daily. 30 tablet 0  . aspirin 81 MG chewable tablet Chew 1 tablet (81 mg total) by mouth daily. 30 tablet 0  .  atorvastatin (LIPITOR) 80 MG tablet Take 1 tablet (80 mg total) by mouth at bedtime. 30 tablet 0  . glipiZIDE (GLUCOTROL) 5 MG tablet TAKE 1 TABLET BY MOUTH TWICE DAILY BEFORE A MEAL 60 tablet  0  . insulin glargine (LANTUS) 100 UNIT/ML injection Inject 0.1 mLs (10 Units total) into the skin daily. 10 mL 11  . omeprazole (PRILOSEC) 40 MG capsule Take 1 capsule (40 mg total) by mouth daily. 30 capsule 3  . ondansetron (ZOFRAN ODT) 4 MG disintegrating tablet Take 1 tablet (4 mg total) by mouth every 8 (eight) hours as needed for nausea or vomiting. (Patient not taking: Reported on 08/19/2019) 20 tablet 0  . rosuvastatin (CRESTOR) 20 MG tablet Take 1 tablet (20 mg total) by mouth daily. 90 tablet 3  . sodium bicarbonate 650 MG tablet Take 2 tablets (1,300 mg total) by mouth 2 (two) times daily. 60 tablet 0    Drug Regimen Review Drug regimen was reviewed and remains appropriate with no significant issues identified  Home: Home Living Family/patient expects to be discharged to:: Private residence Living Arrangements: Parent   Functional History:    Functional Status:  Mobility:          ADL:    Cognition: Cognition Orientation Level: Oriented X4    Physical Exam: Blood pressure (!) 158/83, pulse 86, temperature 98.7 F (37.1 C), temperature source Oral, resp. rate 18, height 5\' 4"  (1.626 m), weight 77.5 kg, SpO2 100 %. Physical Exam Vitals reviewed.  Constitutional:      Appearance: Normal appearance.  HENT:     Head: Normocephalic and atraumatic.     Right Ear: External ear normal.     Left Ear: External ear normal.     Nose: Nose normal.  Eyes:     General:        Right eye: No discharge.        Left eye: No discharge.     Extraocular Movements: Extraocular movements intact.  Cardiovascular:     Rate and Rhythm: Normal rate and regular rhythm.  Pulmonary:     Effort: Pulmonary effort is normal. No respiratory distress.     Breath sounds: Normal breath sounds. No stridor.  Abdominal:     General: Abdomen is flat. Bowel sounds are normal. There is no distension.  Musculoskeletal:     Cervical back: Normal range of motion.     Comments: Left upper  extremity edema, no tenderness.  Skin:    General: Skin is warm and dry.  Neurological:     Mental Status: He is alert.     Comments: Alert and oriented x3 Makes eye contact with examiner.   Follows commands.   Fair awareness of deficits. Motor: RUE/RLE: 5/5 proximal distally LUE: 0/5 proximal distal LLE: Hip flexion, knee extension 1+/5, ankle dorsiflexion 0/5 Sensation intact light touch  Psychiatric:        Speech: Speech is slurred.        Behavior: Behavior is cooperative.     Results for orders placed or performed during the hospital encounter of 04/22/20 (from the past 48 hour(s))  CBC     Status: Abnormal   Collection Time: 04/22/20  3:53 PM  Result Value Ref Range   WBC 9.7 4.0 - 10.5 K/uL   RBC 3.63 (L) 4.22 - 5.81 MIL/uL   Hemoglobin 9.6 (L) 13.0 - 17.0 g/dL   HCT 30.7 (L) 39 - 52 %   MCV 84.6 80.0 - 100.0 fL   MCH  26.4 26.0 - 34.0 pg   MCHC 31.3 30.0 - 36.0 g/dL   RDW 13.7 11.5 - 15.5 %   Platelets 394 150 - 400 K/uL   nRBC 0.0 0.0 - 0.2 %    Comment: Performed at [facility], [place] [address], [place], [place] [address]  Creatinine, serum     Status: Abnormal   Collection Time: 04/22/20  3:53 PM  Result Value Ref Range   Creatinine, Ser 3.21 (H) 0.61 - 1.24 mg/dL   GFR calc non Af Amer 21 (L) >60 mL/min   GFR calc Af Amer 25 (L) >60 mL/min    Comment: Performed at [facility] [address], [place], [place] [address]  Glucose, capillary     Status: Abnormal   Collection Time: 04/22/20  4:29 PM  Result Value Ref Range   Glucose-Capillary 156 (H) 70 - 99 mg/dL    Comment: Glucose reference range applies only to samples taken after fasting for at least 8 hours.   No results found.     Medical Problem List and Plan: 1.  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   -patient may shower  -ELOS/Goals: 14-18 days/Supervision/Min A  Admit  to CIR 2.  Antithrombotics: -DVT/anticoagulation: Lovenox  -antiplatelet therapy: Aspirin 81 mg daily 3. Pain Management: Tylenol as needed 4. Mood: Provide emotional support  -antipsychotic agents: N/A 5. Neuropsych: This patient is capable of making decisions on his own behalf. 6. Skin/Wound Care: Routine skin checks 7. Fluids/Electrolytes/Nutrition: Routine in and outs.  CMP ordered. 8.  Hypertension.  Norvasc 10 mg daily, Lopressor 12.5 mg twice daily  Monitor with increased mobility. 9.  Diabetes mellitus.  Hemoglobin A1c 9.6.  Lantus insulin 10 units daily.  Check blood sugars before meals and at bedtime.  Diabetic teaching  Moderate increased mobility. 10.  CKD stage III.  Creatinine baseline 2.42 08/02/2018.  Renal ultrasound 04/17/2019 showed no hydronephrosis or shadowing stone.  Started on sodium bicarbonate 1300 mg twice daily 04/17/2020.    CMP ordered for tomorrow a.m. 11.  Tobacco abuse.  Counsel 12.  Hyperlipidemia: Lipitor  Cathlyn Parsons, PA-C 04/22/2020  I have personally performed a face to face diagnostic evaluation, including, but not limited to relevant history and physical exam findings, of this patient and developed relevant assessment and plan.  Additionally, I have reviewed and concur with the physician assistant's documentation above.  Delice Lesch, MD, ABPMR  The patient's status has not changed. The original post admission physician evaluation remains appropriate, and any changes from the pre-admission screening or documentation from the acute chart are noted above.   Delice Lesch, MD, ABPMR

## 2020-04-22 NOTE — Progress Notes (Signed)
Inpatient Rehabilitation Medication Review by a Pharmacist  A complete drug regimen review was completed for this patient to identify any potential clinically significant medication issues.  Clinically significant medication issues were identified:  no  Check AMION for pharmacist assigned to patient if future medication questions/issues arise during this admission.  Pharmacist comments:   Time spent performing this drug regimen review (minutes):  <5 minutes   Cyann Venti S. Alford Highland, PharmD, BCPS Clinical Staff Pharmacist [email or web address]  Wayland Salinas 04/22/2020 3:41 PM

## 2020-04-22 NOTE — Plan of Care (Signed)
Pt alert and oriented.  Tele in place. No pain. Cond. Cath in place.   Problem: Education: Goal: Knowledge of disease or condition will improve Outcome: Progressing Goal: Knowledge of secondary prevention will improve Outcome: Progressing Goal: Knowledge of patient specific risk factors addressed and post discharge goals established will improve Outcome: Progressing Goal: Individualized Educational Video(s) Outcome: Progressing   Problem: Coping: Goal: Will verbalize positive feelings about self Outcome: Progressing   Problem: Self-Care: Goal: Ability to participate in self-care as condition permits will improve Outcome: Progressing Goal: Verbalization of feelings and concerns over difficulty with self-care will improve Outcome: Progressing   Problem: Nutrition: Goal: Risk of aspiration will decrease Outcome: Progressing   Problem: Ischemic Stroke/TIA Tissue Perfusion: Goal: Complications of ischemic stroke/TIA will be minimized Outcome: Progressing   Problem: Education: Goal: Knowledge of General Education information will improve Description: Including pain rating scale, medication(s)/side effects and non-pharmacologic comfort measures Outcome: Progressing   Problem: Health Behavior/Discharge Planning: Goal: Ability to manage health-related needs will improve Outcome: Progressing   Problem: Clinical Measurements: Goal: Ability to maintain clinical measurements within normal limits will improve Outcome: Progressing Goal: Will remain free from infection Outcome: Progressing Goal: Diagnostic test results will improve Outcome: Progressing Goal: Respiratory complications will improve Outcome: Progressing Goal: Cardiovascular complication will be avoided Outcome: Progressing   Problem: Activity: Goal: Risk for activity intolerance will decrease Outcome: Progressing   Problem: Nutrition: Goal: Adequate nutrition will be maintained Outcome: Progressing   Problem:  Coping: Goal: Level of anxiety will decrease Outcome: Progressing   Problem: Elimination: Goal: Will not experience complications related to bowel motility Outcome: Progressing Goal: Will not experience complications related to urinary retention Outcome: Progressing   Problem: Pain Managment: Goal: General experience of comfort will improve Outcome: Progressing   Problem: Safety: Goal: Ability to remain free from injury will improve Outcome: Progressing   Problem: Skin Integrity: Goal: Risk for impaired skin integrity will decrease Outcome: Progressing

## 2020-04-22 NOTE — Progress Notes (Signed)
Inpatient Rehabilitation Admissions Coordinator  Cir bed is available to admit today. I met with patient at bedside and he is in agreement. I have notified Dr. Maryland Pink, acute and [facility] team. I will make arrangements to admit today.  Danne Baxter, RN, MSN Rehab Admissions Coordinator [phone] 04/22/2020 10:27 AM

## 2020-04-22 NOTE — Progress Notes (Signed)
Tony Arn, MD  Physician  Physical Medicine and Rehabilitation  PMR Pre-admission     Addendum  Date of Service:  04/20/2020  1:49 PM      Related encounter: ED to Hosp-Admission (Discharged) from 04/15/2020 in [facility]       Show:Clear all [x] Manual[x] Template[x] Copied  Added by: [x] Kemya Shed, Vertis Kelch, RN[x] Tony Arn, MD  [] Hover for details PMR Admission Coordinator Pre-Admission Assessment   Patient: Tony Schmitt is an 49 y.o., male MRN: [id number] DOB: 12-20-1970 Height: 5\' 4"  (162.6 cm) Weight: 73.2 kg                                                                                                                                                  Insurance Information HMO:    PPO:      PCP:      IPA:      80/20:      OTHER:  PRIMARY: uninsured        Development worker, community:       Phone#:    The "Data Collection Information Summary" for patients in Inpatient Rehabilitation Facilities with attached "Privacy Act [place] Records" was provided and verbally reviewed with: N/A   Emergency Contact Information         Contact Information     Name Relation Home Work Frank Mother [phone]   [phone]    Andre Lefort [phone]   [phone]       Current Medical History  Patient Admitting Diagnosis: bilateral infarcts   History of Present Illness: 49 year old right-handed male with history of diabetes mellitus, tobacco abuse, CKD stage III with creatinine baseline 2.42 08/02/2018, hypertension as well as medical noncompliance.   Presented 04/15/2020 with left-sided weakness.  Blood pressure 176/109.  CT/MRI as well as MRA of the head and neck showed acute subacute nonhemorrhagic infarct involving the posterior right corona radiata and anterior aspect of the left external capsule.  Punctate cortical infarct along the inferior left frontal operculum.  No flow signal in the distal left vertebral artery  above the craniocervical junction.  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  Carotid Dopplers with 1 to 39% ICA stenosis.  Patient did not receive TPA.  Admission chemistries alcohol negative, urine drug screen negative, creatinine 3.35, glucose 227, BUN 31, hemoglobin A1c 9.6.  Echocardiogram with ejection fraction of 60 to 65% no wall motion abnormalities.  Currently maintained on aspirin for CVA prophylaxis.  Subcutaneous Lovenox for DVT prophylaxis with lower extremity Dopplers negative for DVT.  TEE showed ejection fraction 65% no evidence of thrombus possible very small PFO.  TCD study was completed negative for PFO.  Monitoring of renal function creatinine 3.06 maintained on IV fluids.  Tolerating a regular diet.  LOOP is not planned at this time, but recommended as an outpatient.   Complete NIHSS TOTAL: 6 Glasgow Coma Scale Score: (P) 15   Past Medical History      Past Medical History:  Diagnosis Date  . Diabetes mellitus without complication ([place])    . Hypertension        Family History  family history includes Cancer in his father; Diabetes in his father and mother; Heart failure in an other family member; Hypertension in his father.   Prior Rehab/Hospitalizations:  Has the patient had prior rehab or hospitalizations prior to admission? Yes   Has the patient had major surgery during 100 days prior to admission? No   Current Medications    Current Facility-Administered Medications:  .   stroke: mapping our early stages of recovery book, , Does not apply, Once, Rise Patience, MD .  acetaminophen (TYLENOL) tablet 650 mg, 650 mg, Oral, Q4H PRN **OR** acetaminophen (TYLENOL) 160 MG/5ML solution 650 mg, 650 mg, Per Tube, Q4H PRN **OR** acetaminophen (TYLENOL) suppository 650 mg, 650 mg, Rectal, Q4H PRN, Rise Patience, MD .  amLODipine (NORVASC) tablet 10 mg, 10 mg, Oral,  Daily, Bonnielee Haff, MD, 10 mg at 04/22/20 0912 .  aspirin chewable tablet 81 mg, 81 mg, Oral, Daily, Rise Patience, MD, 81 mg at 04/22/20 0913 .  atorvastatin (LIPITOR) tablet 80 mg, 80 mg, Oral, QHS, Rise Patience, MD, 80 mg at 04/21/20 2151 .  enoxaparin (LOVENOX) injection 30 mg, 30 mg, Subcutaneous, Q24H, Pierce, Dwayne A, RPH, 30 mg at 04/22/20 0915 .  hydrALAZINE (APRESOLINE) injection 10 mg, 10 mg, Intravenous, Q4H PRN, Rise Patience, MD, 10 mg at 04/16/20 1828 .  insulin aspart (novoLOG) injection 0-9 Units, 0-9 Units, Subcutaneous, TID WC, Rise Patience, MD, 1 Units at 04/21/20 1753 .  insulin glargine (LANTUS) injection 10 Units, 10 Units, Subcutaneous, Daily, Rise Patience, MD, 10 Units at 04/22/20 1055 .  ondansetron (ZOFRAN) injection 4 mg, 4 mg, Intravenous, Q6H PRN, Shalhoub, Sherryll Burger, MD .  pantoprazole (PROTONIX) EC tablet 40 mg, 40 mg, Oral, Daily, Rise Patience, MD, 40 mg at 04/22/20 0914 .  polyethylene glycol (MIRALAX / GLYCOLAX) packet 17 g, 17 g, Oral, Daily, Bonnielee Haff, MD, 17 g at 04/22/20 0911 .  sodium bicarbonate tablet 1,300 mg, 1,300 mg, Oral, BID, Regalado, Belkys A, MD, 1,300 mg at 04/22/20 0913   Patients Current Diet:     Diet Order                      Diet heart healthy/carb modified Room service appropriate? Yes; Fluid consistency: Thin  Diet effective now                      Precautions / Restrictions Precautions Precautions: Fall Precaution Comments: left hemiparesis Restrictions Weight Bearing Restrictions: No    Has the patient had 2 or more falls or a fall with injury in the past year?No   Prior Activity Level Limited Community (1-2x/wk): Independent and unemployed   Prior Functional Level Prior Function Level of Independence: Independent   Self Care: Did the patient need help bathing, dressing, using the toilet or eating?  Independent   Indoor Mobility: Did the patient need  assistance with walking from room to room (with or without device)? Independent   Stairs: Did the patient need assistance with internal or external stairs (with or without device)? Independent   Functional Cognition: Did  the patient need help planning regular tasks such as shopping or remembering to take medications? Independent   Home Assistive Devices / Equipment Home Equipment: Walker - 2 wheels   Prior Device Use: Indicate devices/aids used by the patient prior to current illness, exacerbation or injury? None of the above   Current Functional Level Cognition   Arousal/Alertness: Awake/alert Overall Cognitive Status: Impaired/Different from baseline Orientation Level: (P) Oriented X4 Safety/Judgement: Decreased awareness of safety, Decreased awareness of deficits General Comments: had difficulty problem-solving which LE to advance first (after cued several times by PT); could not problem-solve best place to put chair (L vs R)  for safe transfer    Extremity Assessment (includes Sensation/Coordination)   Upper Extremity Assessment: LUE deficits/detail RUE Deficits / Details: WFL ROM, 5/5 Strength throughout RUE Sensation: WNL RUE Coordination: WNL LUE Deficits / Details: shoulder 2/5, elbow 0/5, wrist 0/5, fingers 0/5 whole arm edemous  LUE Sensation: WNL LUE Coordination: decreased fine motor, decreased gross motor  Lower Extremity Assessment: Defer to PT evaluation LLE Deficits / Details: grossly 2/5, decreased DF, denies decreased sensation LLE Coordination: decreased gross motor, decreased fine motor     ADLs   Overall ADL's : Needs assistance/impaired Eating/Feeding: Set up Grooming: Oral care, Wash/dry face, Wash/dry hands, Minimal assistance, Sitting Grooming Details (indicate cue type and reason): Pt required cues for problem solving and incorporating RUE in care for LUE Upper Body Bathing: Minimal assistance, Set up, Sitting Lower Body Bathing: Minimal assistance, Set  up, Sit to/from stand Upper Body Dressing : Moderate assistance, Set up, Sitting Lower Body Dressing: Moderate assistance, Sit to/from stand Lower Body Dressing Details (indicate cue type and reason): Unable to donn socks at edge of stretcher. Performed pseudo pant activity and needed min assist. Toilet Transfer: Minimal assistance, Stand-pivot, BSC Toileting- Clothing Manipulation and Hygiene: Minimal assistance Toileting - Clothing Manipulation Details (indicate cue type and reason): Reports wiping himself, partial assistance needed for clothing management     Mobility   Overal bed mobility: Needs Assistance Bed Mobility: Supine to Sit Rolling: Min assist Sidelying to sit: Mod assist (flat bed) Supine to sit: Mod assist, HOB elevated Sit to supine: Mod assist General bed mobility comments: pt allowed to choose his technique for coming to sit, he advanced RLE over EOB, requested assist with his LLE, required cues to bring his LUE across his body (and to grab UE by his forearm), cues and assist to push up towards sitting with RUE     Transfers   Overall transfer level: Needs assistance Equipment used: Rolling walker (2 wheeled), None Transfers: Sit to/from Stand Sit to Stand: Mod assist, Min assist (mod initially, progressed to min with reps) Stand pivot transfers: Mod assist General transfer comment: cues for hand placement each transfer; poor mechanics initially, improved with repetition to need min assist     Ambulation / Gait / Stairs / Wheelchair Mobility   Ambulation/Gait Ambulation/Gait assistance: Mod assist Gait Distance (Feet): 15 Feet Assistive device: Rolling walker (2 wheeled) Gait Pattern/deviations: Step-to pattern, Decreased step length - left, Decreased stance time - left, Decreased stride length, Decreased dorsiflexion - left, Decreased weight shift to left General Gait Details: Patient advancing LLE via use of adductors with LLE in external rotation and "sliding"  foot along the floor then pressing left knee into hyperextension for weight-bearing and advancing RLE; facilitated to advance LLE with knee flexion, not lock into hyperextension (he does not buckle), and prevent left hip retraction Gait velocity: decreased Gait velocity interpretation: <1.31 ft/sec,  indicative of household ambulator     Posture / Balance Dynamic Sitting Balance Sitting balance - Comments: UE supported sitting and stability; vc and incr time to scoot left hip forward. Balance Overall balance assessment: Needs assistance Sitting-balance support: Feet supported Sitting balance-Leahy Scale: Poor Sitting balance - Comments: UE supported sitting and stability; vc and incr time to scoot left hip forward. Standing balance support: During functional activity Standing balance-Leahy Scale: Poor Standing balance comment: Assist or RW and external support High Level Balance Comments: keeps weight shifted to stronger right side to balance     Special needs/care consideration Hgb A1c 9.6 Visitor is Mom, Norren        Previous Home Environment  Living Arrangements: Parent  Lives With: Family Available Help at Discharge: Available 24 hours/day Type of Home: House Home Layout: One level Home Access: Stairs to enter Entrance Stairs-Rails: Right Entrance Stairs-Number of Steps: 2 Bathroom Shower/Tub: Public librarian, Architectural technologist: Programmer, systems: Yes How Accessible: Accessible via walker Home Care Services: No Additional Comments: tub/shower   Discharge Living Setting Plans for Discharge Living Setting: Lives with (comment), House (Mom) Type of Home at Discharge: House Discharge Home Layout: One level Discharge Home Access: Stairs to enter Entrance Stairs-Rails: Right Entrance Stairs-Number of Steps: 2 Discharge Bathroom Shower/Tub: Tub/shower unit, Curtain Discharge Bathroom Toilet: Standard Discharge Bathroom Accessibility: Yes How Accessible:  Accessible via walker Does the patient have any problems obtaining your medications?: Yes (Describe) (uninsured)   Social/Family/Support Systems Contact Information: Mom, Therapist, music Anticipated Caregiver: Mom Anticipated Caregiver's Contact Information: see above Caregiver Availability: 24/7 Discharge Plan Discussed with Primary Caregiver: Yes Is Caregiver In Agreement with Plan?: Yes Does Caregiver/Family have Issues with Lodging/Transportation while Pt is in Rehab?: No   Goals Patient/Family Goal for Rehab: Supervision/Min A with PT and OT Expected length of stay: [facility] 13-17 days. Pt/Family Agrees to Admission and willing to participate: Yes Program Orientation Provided & Reviewed with Pt/Caregiver Including Roles  & Responsibilities: Yes   Decrease burden of Care through IP rehab admission: n/a   Possible need for SNF placement upon discharge:not antiicpated   Patient Condition: This patient's medical and functional status has changed since the consult dated: 04/18/2020 in which the Rehabilitation Physician determined and documented that the patient's condition is appropriate for intensive rehabilitative care in an inpatient rehabilitation facility. See "History of Present Illness" (above) for medical update. Functional changes are: overall mod assist. Patient's medical and functional status update has been discussed with the Rehabilitation physician and patient remains appropriate for inpatient rehabilitation. Will admit to inpatient rehab today.   Preadmission Screen Completed By:  Cleatrice Burke, RN, 04/22/2020 11:13 AM ______________________________________________________________________   Discussed status with Dr. Posey Pronto on 04/22/2020 at  1114 and received approval for admission today.   Admission Coordinator:  Cleatrice Burke, time 4098 Date 04/22/2020         Revision History                               Note Details  Author Tony Arn, MD File Time  04/22/2020 11:16 AM  Author Type Physician Status Addendum  Last Editor Tony Arn, MD Service Physical Medicine and [place] # [id number] Admit Date 04/22/2020

## 2020-04-22 NOTE — Progress Notes (Signed)
Patient has not had a bowel movement since 9/11 per report. Nothing PRN ordered in [facility]

## 2020-04-22 NOTE — TOC Transition Note (Addendum)
Transition of Care [facility]) - CM/SW Discharge Note   Patient Details  Name: Tony LAX MRN: [id number] Date of Birth: [birth year]  Transition of Care [facility]) CM/SW Contact:  Pollie Friar, RN Phone Number: 04/22/2020, 11:30 AM   Clinical Narrative:    Pt discharging to CIR today.  F/u PCP appt with one of the [place] will need to be arranged at time of d/c from CIR. CM signing off.    Final next level of care: IP Rehab Facility Barriers to Discharge: No Barriers Identified   Patient Goals and CMS Choice   CMS Medicare.gov Compare Post Acute Care list provided to:: Patient Choice offered to / list presented to : Patient, Parent  Discharge Placement                       Discharge Plan and Services   Discharge Planning Services: CM Consult Post Acute Care Choice: IP Rehab                               Social Determinants of Health (SDOH) Interventions     Readmission Risk Interventions No flowsheet data found.

## 2020-04-22 NOTE — Discharge Summary (Signed)
[facility]  Physician Discharge Summary   Patient ID: Tony Schmitt MRN: [id number] DOB/AGE: 13-Apr-1971 49 y.o.  Admit date: 04/15/2020 Discharge date: 04/22/2020  PCP: No primary care provider on file.  DISCHARGE DIAGNOSES:  Acute stroke Chronic kidney disease stage IIIa Normal anion gap metabolic acidosis Hyperlipidemia Diabetes mellitus type 2 Normocytic anemia Essential hypertension   RECOMMENDATIONS FOR OUTPATIENT FOLLOW UP: 1. Please check renal function panel every 2 to 3 days.  Will need outpatient referral to nephrology. 2. Patient will need lipid panel to be checked in 3 weeks 3. Please consider adding metoprolol for blood pressure control if it remains elevated    Home Health: Patient going to [facility] Equipment/Devices: Per [facility]  CODE STATUS: Full code  DISCHARGE CONDITION: fair  Diet recommendation: Modified carbohydrate  INITIAL HISTORY: 49 year old PMH hypertension, diabetes, CKD stage IIIa who presents to the ED with left-sided weakness. Patient weakness a started on September 04/2020 evening but it was mild. He wake up on 9/10 with worsening weakness. He was not able to stand up from the floor and call his mom. Evaluation in the ED CT head showed low-attenuation changes in the right periventricular white matter concerning for acute infarct versus mass lesion. Systolic blood pressure markedly elevated more than 200.  Consultations:  Neurology   HOSPITAL COURSE:   Acute CVA, involve posterior right corona and anterior aspect of left external capsule. -MRI; acute subacute nonhemorrhagic infarct involving posterior right corona radiata  and anterior aspect of the left external capsule. Right-sided infarct likely impact corticospinal tracts involving left  motor function. Punctate cortical infarct along the inferior left frontal operculum.  -MRA; moderate to high-grade stenosis at the junction of the right V3 and V4 segment with segmental narrowing of  the V4 segment above this level. Moderate proximal basilar artery stenosis. Atherosclerotic irregularity in the cavernous internal carotid arteries bilaterally with 50 to 60% stenosis of the supraclinoid right ICA. Moderate stenosis of the right posterior communicating artery with asymmetric attenuation of distal PCA branch vessel on the right. Patient remains on aspirin and statin.  Patient underwent TEE which was negative for thrombus.  Possible small PFO noted.  Lower extremity Dopplers negative for DVT. ANA is negative.  Anticardiolipin antibodies within normal range. A loop recorder was recommended by neurology.  Discussed with the electrophysiology.    Due to lack of insurance there are apparently some follow-up issues with the loop recorder.  Patient is okay with waiting until he has health insurance of some sort. Patient otherwise is stable from a neurological standpoint.  Acute on CKD stage IIIa/normal anion gap metabolic acidosis; Baseline creatinine is around 1.8.  Presented with a creatinine of 3.5.   Patient was started on IV fluids with improvement in his creatinine.   Creatinine seems to be hanging around 3.0 at this time.  This may be his new baseline.  He does have good urine output.  He remains on sodium bicarbonate for metabolic acidosis.   Will recommend checking renal function panel every few days at the rehabilitation center. Will eventually need outpatient referral to nephrology.  Hyperlipidemia;  LDL 584. Started on lipitor.  Recheck labs in 3 weeks.  Diabetes Type 2;  Hb-A1c; 9.7 On low dose lantus.  Needs to continue to hold metformin due to CKD CBGs are reasonably well controlled.  Potassium abnormalities: Patient has had episodes of hyperkalemia as well as hypokalemia.    Stable the last few days.  Leukocytosis Etiology unclear.    Noted  to be normal the last few days.  Normocytic anemia No evidence for overt blood loss..  Likely due to chronic disease.   Hemoglobin is stable.  Essential hypertension Initially allowed permissive hypertension.  Plan to control his blood pressure gradually.  He was started on amlodipine.  Was on same at home.  Amlodipine was increased to 10 mg daily.  Blood pressure is stable.  Patient was also on lisinopril at home which has been held due to renal failure. Will recommend adding a beta-blocker next to control blood pressure.  Left Arm swelling;  No DVT noted  Overall patient remained stable.  Okay for discharge to inpatient rehabilitation.    PERTINENT LABS:  The results of significant diagnostics from this hospitalization (including imaging, microbiology, ancillary and laboratory) are listed below for reference.    Microbiology: Recent Results (from the past 240 hour(s))  SARS Coronavirus 2 by RT PCR (hospital order, performed in [facility] hospital lab) Nasopharyngeal Nasopharyngeal Swab     Status: None   Collection Time: 04/16/20  1:30 AM   Specimen: Nasopharyngeal Swab  Result Value Ref Range Status   SARS Coronavirus 2 NEGATIVE NEGATIVE Final    Comment: (NOTE) SARS-CoV-2 target nucleic acids are NOT DETECTED.  The SARS-CoV-2 RNA is generally detectable in upper and lower respiratory specimens during the acute phase of infection. The lowest concentration of SARS-CoV-2 viral copies this assay can detect is 250 copies / mL. A negative result does not preclude SARS-CoV-2 infection and should not be used as the sole basis for treatment or other patient management decisions.  A negative result may occur with improper specimen collection / handling, submission of specimen other than nasopharyngeal swab, presence of viral mutation(s) within the areas targeted by this assay, and inadequate number of viral copies (<250 copies / mL). A negative result must be combined with clinical observations, patient history, and epidemiological information.  Fact Sheet for Patients:    StrictlyIdeas.no  Fact Sheet for Healthcare Providers: BankingDealers.co.za  This test is not yet approved or  cleared by the [place] FDA and has been authorized for detection and/or diagnosis of SARS-CoV-2 by FDA under an Emergency Use Authorization (EUA).  This EUA will remain in effect (meaning this test can be used) for the duration of the COVID-19 declaration under Section 564(b)(1) of the Act, 21 U.S.C. section 360bbb-3(b)(1), unless the authorization is terminated or revoked sooner.  Performed at [facility], [place] [address] [place], [place] [address]   Urine Culture     Status: Abnormal   Collection Time: 04/17/20  7:24 AM   Specimen: Urine, Random  Result Value Ref Range Status   Specimen Description URINE, RANDOM  Final   Special Requests   Final    NONE Performed at [facility], [place] [address] [place],  [address]    Culture MULTIPLE SPECIES PRESENT, SUGGEST RECOLLECTION (A)  Final   Report Status 04/18/2020 FINAL  Final     Labs:    Basic Metabolic Panel: Recent Labs  Lab 04/17/20 0824 04/17/20 0824 04/17/20 1450 04/18/20 0910 04/19/20 0843 04/21/20 0950 04/22/20 0136  NA 136   < > 137 140 136 137 138  K 5.7*   < > 3.3* 3.2* 3.4* 3.7 3.7  CL 110   < > 108 112* 108 109 109  CO2 16*   < > 22 20* 21* 21* 23  GLUCOSE 107*   < > 132* 93 139* 210* 156*  BUN 30*   < >  29* 31* 32* 30* 32*  CREATININE 3.08*   < > 3.01* 3.06* 2.93* 2.92* 3.10*  CALCIUM 9.1   < > 9.0 8.9 8.6* 8.7* 8.9  MG 1.8  --   --   --   --   --   --    < > = values in this interval not displayed.   Liver Function Tests: Recent Labs  Lab 04/16/20 0045 04/16/20 0558  AST 21 20  ALT 18 16  ALKPHOS 82 88  BILITOT 0.6 0.6  PROT 5.4* 5.4*  ALBUMIN 1.9* 1.8*   CBC: Recent Labs  Lab 04/16/20 0045 04/16/20 0045 04/16/20 0558 04/17/20 0824 04/18/20 0910 04/21/20 0950 04/22/20 0136  WBC 12.0*    < > 12.1* 10.8* 10.8* 9.2 10.3  NEUTROABS 9.5*  --   --   --   --   --   --   HGB 10.7*   < > 10.9* 10.6* 9.4* 9.6* 9.3*  HCT 33.3*   < > 34.5* 33.1* 29.5* 30.0* 29.1*  MCV 85.4   < > 85.8 84.7 85.0 84.3 84.3  PLT 455*   < > 459* 434* 426* 386 393   < > = values in this interval not displayed.    CBG: Recent Labs  Lab 04/21/20 1221 04/21/20 1744 04/21/20 2102 04/22/20 0614 04/22/20 1126  GLUCAP 225* 143* 187* 112* 118*     IMAGING STUDIES CT HEAD WO CONTRAST  Result Date: 04/16/2020 CLINICAL DATA:  Acute onset left-sided weakness for 1 day. Suspected stroke. EXAM: CT HEAD WITHOUT CONTRAST TECHNIQUE: Contiguous axial images were obtained from the base of the skull through the vertex without intravenous contrast. COMPARISON:  None. FINDINGS: Brain: Low-attenuation change in the right periventricular white matter suggesting acute infarct or possibly a mass lesion. Consider MRI for further evaluation. Old lacunar infarcts suggested in the left basal ganglia. No mass effect or midline shift. No abnormal extra-axial fluid collections. Gray-white matter junctions are distinct. Basal cisterns are not effaced. No acute intracranial hemorrhage. Vascular: No hyperdense vessel or unexpected calcification. Skull: Calvarium appears intact. Sinuses/Orbits: Paranasal sinuses are clear. Hypoaeration of the mastoid air cells with small mastoid effusions. Mucosal thickening in the right middle ear possibly representing inflammatory process or cholesteatoma. Other: None. IMPRESSION: 1. Low-attenuation change in the right periventricular white matter suggesting acute infarct or possibly a mass lesion. Consider MRI for further evaluation. 2. Old lacunar infarcts in the left basal ganglia. 3. No acute intracranial hemorrhage. 4. Hypoaeration of the mastoid air cells with small mastoid effusions. Mucosal thickening in the right middle ear possibly representing inflammatory process or cholesteatoma. Electronically  Signed   By: Lucienne Capers M.D.   On: 04/16/2020 01:02   MR ANGIO HEAD WO CONTRAST  Result Date: 04/16/2020 CLINICAL DATA:  New onset of left upper extremity weakness. Stroke symptoms have progressed significantly overnight. EXAM: MRI HEAD WITHOUT CONTRAST MRA HEAD WITHOUT CONTRAST MRA NECK WITHOUT CONTRAST TECHNIQUE: Multiplanar, multiecho pulse sequences of the brain and surrounding structures were obtained without intravenous contrast. Angiographic images of the Circle of Willis were obtained using MRA technique without intravenous contrast. Angiographic images of the neck were obtained using MRA technique without intravenous contrast. Carotid stenosis measurements (when applicable) are obtained utilizing NASCET criteria, using the distal internal carotid diameter as the denominator. COMPARISON:  CT head without contrast 04/16/2020 FINDINGS: MRI HEAD FINDINGS Brain: The diffusion-weighted images confirm acute/subacute nonhemorrhagic infarcts involving the posterior right corona radiata. White matter infarct in the anterior aspect of the  left external capsule, just anterior to the lentiform nucleus measures up to 13 mm. Punctate cortical infarct is present along the inferior left frontal operculum. T2 signal changes are associated with each of these areas. More remote lacunar infarct is present at the left globus pallidus. The basal ganglia are otherwise intact Periventricular and subcortical T2 signal changes are otherwise mildly advanced for age. The internal auditory canals are within normal limits. The brainstem and cerebellum are within normal limits. Vascular: The distal left vertebral artery is not visualized. Flow is otherwise present in the major intracranial arteries. Skull and upper cervical spine: Craniocervical junction is normal. Flow is present in the vertebral arteries bilaterally. Visualized intracranial contents are normal. Sinuses/Orbits: Right greater than left mastoid effusion is  present. The paranasal sinuses and mastoid air cells are otherwise clear. The globes and orbits are within normal limits. MRA HEAD FINDINGS Atherosclerotic irregularity is present in the cavernous internal carotid arteries bilaterally. 50-60% stenosis is present in the supraclinoid right ICA. No other significant stenoses are present through the ICA termini. The anterior communicating artery is patent. MCA bifurcations are intact. Asymmetric attenuation of distal left MCA branch vessels is noted without a significant proximal stenosis or occlusion. No flow signal is present in the left vertebral artery. Moderate to high-grade stenosis is present at the junction at the V3 and V4 segments with segmental narrowing of the V4 segment above this level. Moderate proximal basilar artery stenosis is present. Basilar artery is small, terminating at the superior cerebellar arteries. Both posterior cerebral arteries are of fetal type. Moderate stenoses are present in the right posterior communicating artery with asymmetric attenuation of distal PCA branch vessels on the right. MRA NECK FINDINGS Time of flight imaging demonstrates common origin of the left common carotid artery and innominate artery. No significant proximal stenoses are present. MCA bifurcations are intact without significant flow disturbance. Flow is antegrade in the vertebral arteries bilaterally. The right vertebral artery is the dominant vessel. No flow is present in the distal left vertebral artery above the craniocervical junction. IMPRESSION: 1. Acute/subacute nonhemorrhagic infarcts involving the posterior right corona radiata and anterior aspect of the left external capsule, corresponding with patient's symptoms. Right-sided infarcts likely impacts cortical spinal tracts involving left motor function. 2. Punctate cortical infarct along the inferior left frontal operculum. 3. Periventricular and subcortical T2 signal changes are otherwise mildly advanced  for age. This likely reflects the sequela of chronic microvascular ischemia. 4. No flow signal in the distal left vertebral artery above the craniocervical junction. 5. Moderate to high-grade stenosis at the junction at the right V3 and V4 segments with segmental narrowing of the V4 segment above this level. 6. Moderate proximal basilar artery stenosis. 7. Atherosclerotic irregularity in the cavernous internal carotid arteries bilaterally with a 50-60% stenosis in the supraclinoid right ICA. 8. Moderate stenoses of the right posterior communicating artery with asymmetric attenuation of distal PCA branch vessels on the right. The right posterior cerebral artery is of fetal type. 9. No significant proximal stenosis in the neck. Electronically Signed   By: San Morelle M.D.   On: 04/16/2020 12:19   MR ANGIO NECK WO CONTRAST  Result Date: 04/16/2020 CLINICAL DATA:  New onset of left upper extremity weakness. Stroke symptoms have progressed significantly overnight. EXAM: MRI HEAD WITHOUT CONTRAST MRA HEAD WITHOUT CONTRAST MRA NECK WITHOUT CONTRAST TECHNIQUE: Multiplanar, multiecho pulse sequences of the brain and surrounding structures were obtained without intravenous contrast. Angiographic images of the Circle of Willis were obtained using  MRA technique without intravenous contrast. Angiographic images of the neck were obtained using MRA technique without intravenous contrast. Carotid stenosis measurements (when applicable) are obtained utilizing NASCET criteria, using the distal internal carotid diameter as the denominator. COMPARISON:  CT head without contrast 04/16/2020 FINDINGS: MRI HEAD FINDINGS Brain: The diffusion-weighted images confirm acute/subacute nonhemorrhagic infarcts involving the posterior right corona radiata. White matter infarct in the anterior aspect of the left external capsule, just anterior to the lentiform nucleus measures up to 13 mm. Punctate cortical infarct is present along the  inferior left frontal operculum. T2 signal changes are associated with each of these areas. More remote lacunar infarct is present at the left globus pallidus. The basal ganglia are otherwise intact Periventricular and subcortical T2 signal changes are otherwise mildly advanced for age. The internal auditory canals are within normal limits. The brainstem and cerebellum are within normal limits. Vascular: The distal left vertebral artery is not visualized. Flow is otherwise present in the major intracranial arteries. Skull and upper cervical spine: Craniocervical junction is normal. Flow is present in the vertebral arteries bilaterally. Visualized intracranial contents are normal. Sinuses/Orbits: Right greater than left mastoid effusion is present. The paranasal sinuses and mastoid air cells are otherwise clear. The globes and orbits are within normal limits. MRA HEAD FINDINGS Atherosclerotic irregularity is present in the cavernous internal carotid arteries bilaterally. 50-60% stenosis is present in the supraclinoid right ICA. No other significant stenoses are present through the ICA termini. The anterior communicating artery is patent. MCA bifurcations are intact. Asymmetric attenuation of distal left MCA branch vessels is noted without a significant proximal stenosis or occlusion. No flow signal is present in the left vertebral artery. Moderate to high-grade stenosis is present at the junction at the V3 and V4 segments with segmental narrowing of the V4 segment above this level. Moderate proximal basilar artery stenosis is present. Basilar artery is small, terminating at the superior cerebellar arteries. Both posterior cerebral arteries are of fetal type. Moderate stenoses are present in the right posterior communicating artery with asymmetric attenuation of distal PCA branch vessels on the right. MRA NECK FINDINGS Time of flight imaging demonstrates common origin of the left common carotid artery and innominate  artery. No significant proximal stenoses are present. MCA bifurcations are intact without significant flow disturbance. Flow is antegrade in the vertebral arteries bilaterally. The right vertebral artery is the dominant vessel. No flow is present in the distal left vertebral artery above the craniocervical junction. IMPRESSION: 1. Acute/subacute nonhemorrhagic infarcts involving the posterior right corona radiata and anterior aspect of the left external capsule, corresponding with patient's symptoms. Right-sided infarcts likely impacts cortical spinal tracts involving left motor function. 2. Punctate cortical infarct along the inferior left frontal operculum. 3. Periventricular and subcortical T2 signal changes are otherwise mildly advanced for age. This likely reflects the sequela of chronic microvascular ischemia. 4. No flow signal in the distal left vertebral artery above the craniocervical junction. 5. Moderate to high-grade stenosis at the junction at the right V3 and V4 segments with segmental narrowing of the V4 segment above this level. 6. Moderate proximal basilar artery stenosis. 7. Atherosclerotic irregularity in the cavernous internal carotid arteries bilaterally with a 50-60% stenosis in the supraclinoid right ICA. 8. Moderate stenoses of the right posterior communicating artery with asymmetric attenuation of distal PCA branch vessels on the right. The right posterior cerebral artery is of fetal type. 9. No significant proximal stenosis in the neck. Electronically Signed   By: Wynetta Fines.D.  On: 04/16/2020 12:19   MR Brain Wo Contrast (neuro protocol)  Result Date: 04/16/2020 CLINICAL DATA:  New onset of left upper extremity weakness. Stroke symptoms have progressed significantly overnight. EXAM: MRI HEAD WITHOUT CONTRAST MRA HEAD WITHOUT CONTRAST MRA NECK WITHOUT CONTRAST TECHNIQUE: Multiplanar, multiecho pulse sequences of the brain and surrounding structures were obtained without  intravenous contrast. Angiographic images of the Circle of Willis were obtained using MRA technique without intravenous contrast. Angiographic images of the neck were obtained using MRA technique without intravenous contrast. Carotid stenosis measurements (when applicable) are obtained utilizing NASCET criteria, using the distal internal carotid diameter as the denominator. COMPARISON:  CT head without contrast 04/16/2020 FINDINGS: MRI HEAD FINDINGS Brain: The diffusion-weighted images confirm acute/subacute nonhemorrhagic infarcts involving the posterior right corona radiata. White matter infarct in the anterior aspect of the left external capsule, just anterior to the lentiform nucleus measures up to 13 mm. Punctate cortical infarct is present along the inferior left frontal operculum. T2 signal changes are associated with each of these areas. More remote lacunar infarct is present at the left globus pallidus. The basal ganglia are otherwise intact Periventricular and subcortical T2 signal changes are otherwise mildly advanced for age. The internal auditory canals are within normal limits. The brainstem and cerebellum are within normal limits. Vascular: The distal left vertebral artery is not visualized. Flow is otherwise present in the major intracranial arteries. Skull and upper cervical spine: Craniocervical junction is normal. Flow is present in the vertebral arteries bilaterally. Visualized intracranial contents are normal. Sinuses/Orbits: Right greater than left mastoid effusion is present. The paranasal sinuses and mastoid air cells are otherwise clear. The globes and orbits are within normal limits. MRA HEAD FINDINGS Atherosclerotic irregularity is present in the cavernous internal carotid arteries bilaterally. 50-60% stenosis is present in the supraclinoid right ICA. No other significant stenoses are present through the ICA termini. The anterior communicating artery is patent. MCA bifurcations are intact.  Asymmetric attenuation of distal left MCA branch vessels is noted without a significant proximal stenosis or occlusion. No flow signal is present in the left vertebral artery. Moderate to high-grade stenosis is present at the junction at the V3 and V4 segments with segmental narrowing of the V4 segment above this level. Moderate proximal basilar artery stenosis is present. Basilar artery is small, terminating at the superior cerebellar arteries. Both posterior cerebral arteries are of fetal type. Moderate stenoses are present in the right posterior communicating artery with asymmetric attenuation of distal PCA branch vessels on the right. MRA NECK FINDINGS Time of flight imaging demonstrates common origin of the left common carotid artery and innominate artery. No significant proximal stenoses are present. MCA bifurcations are intact without significant flow disturbance. Flow is antegrade in the vertebral arteries bilaterally. The right vertebral artery is the dominant vessel. No flow is present in the distal left vertebral artery above the craniocervical junction. IMPRESSION: 1. Acute/subacute nonhemorrhagic infarcts involving the posterior right corona radiata and anterior aspect of the left external capsule, corresponding with patient's symptoms. Right-sided infarcts likely impacts cortical spinal tracts involving left motor function. 2. Punctate cortical infarct along the inferior left frontal operculum. 3. Periventricular and subcortical T2 signal changes are otherwise mildly advanced for age. This likely reflects the sequela of chronic microvascular ischemia. 4. No flow signal in the distal left vertebral artery above the craniocervical junction. 5. Moderate to high-grade stenosis at the junction at the right V3 and V4 segments with segmental narrowing of the V4 segment above this level. 6.  Moderate proximal basilar artery stenosis. 7. Atherosclerotic irregularity in the cavernous internal carotid arteries  bilaterally with a 50-60% stenosis in the supraclinoid right ICA. 8. Moderate stenoses of the right posterior communicating artery with asymmetric attenuation of distal PCA branch vessels on the right. The right posterior cerebral artery is of fetal type. 9. No significant proximal stenosis in the neck. Electronically Signed   By: San Morelle M.D.   On: 04/16/2020 12:19   VAS [place] TRANSCRANIAL DOPPLER W BUBBLES  Result Date: 04/19/2020  Transcranial Doppler with Bubble Indications: Stroke. Performing Technologist: Abram Sander RVS  Examination Guidelines: A complete evaluation includes B-mode imaging, spectral Doppler, color Doppler, and power Doppler as needed of all accessible portions of each vessel. Bilateral testing is considered an integral part of a complete examination. Limited examinations for reoccurring indications may be performed as noted.  Summary: No HITS at rest or during Valsalva. Negative transcranial Doppler Bubble study with no evidence of right to left intracardiac communication.  A vascular evaluation was performed. The right middle cerebral artery was studied. An IV was inserted into the patient's right forearm . Verbal informed consent was obtained.  Negative TCD Bubble study *See table(s) above for TCD measurements and observations.  Diagnosing physician: Antony Contras MD Electronically signed by Antony Contras MD on 04/19/2020 at 12:33:39 PM.    Final    ECHO TEE  Result Date: 04/19/2020    TRANSESOPHOGEAL ECHO REPORT   Patient Name:   Tony Schmitt Stambaugh Date of Exam: 04/18/2020 Medical Rec #:  [id number]     Height:       64.0 in Accession #:    [id number]    Weight:       161.4 lb Date of Birth:  October 04, 1970     BSA:          1.786 m Patient Age:    63 years      BP:           124/69 mmHg Patient Gender: M             HR:           88 bpm. Exam Location:  Inpatient Procedure: Transesophageal Echo Indications:    stroke 434.9`  History:        Patient has prior history of Echocardiogram  examinations, most                 recent 04/16/2020. Arrythmias:PVC; Risk Factors:Hypertension,                 Diabetes and Former Smoker.  Sonographer:    Jannett Celestine RDCS (AE) Referring Phys: [id number] ANGELA NICOLE DUKE PROCEDURE: The transesophogeal probe was passed without difficulty through the esophogus of the patient. Sedation performed by different physician. The patient's vital signs; including heart rate, blood pressure, and oxygen saturation; remained stable throughout the procedure. The patient developed no complications during the procedure. IMPRESSIONS  1. Left ventricular ejection fraction, by estimation, is 60 to 65%. The left ventricle has normal function. The left ventricle has no regional wall motion abnormalities.  2. Right ventricular systolic function is normal. The right ventricular size is normal.  3. No left atrial/left atrial appendage thrombus was detected.  4. The mitral valve is normal in structure. Trivial mitral valve regurgitation. No evidence of mitral stenosis.  5. The aortic valve is normal in structure. Aortic valve regurgitation is not visualized. No aortic stenosis is present.  6. The inferior vena cava is normal in size  with greater than 50% respiratory variability, suggesting  7. Possible very small PFO noted with agitated saline contrast study with late bubbles after 7 cardiac cycles and Valsalva. Conclusion(s)/Recommendation(s): Normal biventricular function without evidence of hemodynamically significant valvular heart disease. FINDINGS  Left Ventricle: Left ventricular ejection fraction, by estimation, is 60 to 65%. The left ventricle has normal function. The left ventricle has no regional wall motion abnormalities. The left ventricular internal cavity size was normal in size. There is  no left ventricular hypertrophy. Right Ventricle: The right ventricular size is normal. No increase in right ventricular wall thickness. Right ventricular systolic function is normal.  Left Atrium: Left atrial size was normal in size. No left atrial/left atrial appendage thrombus was detected. Right Atrium: Right atrial size was normal in size. Pericardium: There is no evidence of pericardial effusion. Mitral Valve: The mitral valve is normal in structure. Trivial mitral valve regurgitation. No evidence of mitral valve stenosis. Tricuspid Valve: The tricuspid valve is normal in structure. Tricuspid valve regurgitation is not demonstrated. No evidence of tricuspid stenosis. Aortic Valve: The aortic valve is normal in structure. Aortic valve regurgitation is not visualized. No aortic stenosis is present. Pulmonic Valve: The pulmonic valve was normal in structure. Pulmonic valve regurgitation is trivial. No evidence of pulmonic stenosis. Aorta: The aortic root is normal in size and structure. There is minimal (Grade I) layered plaque. Venous: The inferior vena cava is normal in size with greater than 50% respiratory variability, suggesting right atrial pressure of 3 mmHg. IAS/Shunts: Possible small shunt. Agitated saline contrast was given intravenously to evaluate for intracardiac shunting. Possible very small PFO noted with agitated saline contrast study with late bubbles after 7 cardiac cycles and Valsalva. [name] MD Electronically signed by [name] MD Signature Date/Time: 04/19/2020/12:47:22 PM    Final    ECHOCARDIOGRAM COMPLETE BUBBLE STUDY  Result Date: 04/16/2020    ECHOCARDIOGRAM REPORT   Patient Name:   Tony Schmitt Date of Exam: 04/16/2020 Medical Rec #:  [id number]     Height:       64.0 in Accession #:    [id number]    Weight:       156.0 lb Date of Birth:  Jul 06, 1971     BSA:          1.760 m Patient Age:    69 years      BP:           199/106 mmHg Patient Gender: M             HR:           99 bpm. Exam Location:  Inpatient Procedure: 2D Echo Indications:    434.91 stroke  History:        Patient has prior history of Echocardiogram examinations, most                 recent  04/18/2019. Risk Factors:Hypertension, Diabetes and Former                 Smoker.  Sonographer:    Jannett Celestine RDCS (AE) Referring Phys: [place]  1. Left ventricular ejection fraction, by estimation, is 60 to 65%. The left ventricle has normal function. The left ventricle has no regional wall motion abnormalities. There is mild concentric left ventricular hypertrophy. Left ventricular diastolic function could not be evaluated.  2. Right ventricular systolic function is normal. The right ventricular size is normal. Tricuspid regurgitation signal is inadequate for assessing PA  pressure.  3. The mitral valve is normal in structure. No evidence of mitral valve regurgitation. No evidence of mitral stenosis.  4. The aortic valve is normal in structure. Aortic valve regurgitation is not visualized. No aortic stenosis is present.  5. The inferior vena cava is normal in size with greater than 50% respiratory variability, suggesting right atrial pressure of 3 mmHg. FINDINGS  Left Ventricle: Left ventricular ejection fraction, by estimation, is 60 to 65%. The left ventricle has normal function. The left ventricle has no regional wall motion abnormalities. The left ventricular internal cavity size was normal in size. There is  mild concentric left ventricular hypertrophy. Left ventricular diastolic function could not be evaluated. Right Ventricle: The right ventricular size is normal. No increase in right ventricular wall thickness. Right ventricular systolic function is normal. Tricuspid regurgitation signal is inadequate for assessing PA pressure. Left Atrium: Left atrial size was normal in size. Right Atrium: Right atrial size was normal in size. Pericardium: There is no evidence of pericardial effusion. Mitral Valve: The mitral valve is normal in structure. No evidence of mitral valve regurgitation. No evidence of mitral valve stenosis. Tricuspid Valve: The tricuspid valve is normal in  structure. Tricuspid valve regurgitation is not demonstrated. No evidence of tricuspid stenosis. Aortic Valve: The aortic valve is normal in structure. Aortic valve regurgitation is not visualized. No aortic stenosis is present. Pulmonic Valve: The pulmonic valve was normal in structure. Pulmonic valve regurgitation is not visualized. No evidence of pulmonic stenosis. Aorta: The aortic root is normal in size and structure. Venous: The inferior vena cava is normal in size with greater than 50% respiratory variability, suggesting right atrial pressure of 3 mmHg. IAS/Shunts: No atrial level shunt detected by color flow Doppler.  LEFT VENTRICLE PLAX 2D LVIDd:         4.30 cm LVIDs:         2.40 cm LV PW:         1.20 cm LV IVS:        1.20 cm LVOT diam:     2.00 cm LV SV:         49 LV SV Index:   28 LVOT Area:     3.14 cm  RIGHT VENTRICLE RV S prime:     13.90 cm/s TAPSE (M-mode): 2.1 cm LEFT ATRIUM             Index LA diam:        2.90 cm 1.65 cm/m LA Vol (A2C):   26.8 ml 15.22 ml/m LA Vol (A4C):   49.3 ml 28.01 ml/m LA Biplane Vol: 36.5 ml 20.73 ml/m  AORTIC VALVE LVOT Vmax:   89.50 cm/s LVOT Vmean:  65.200 cm/s LVOT VTI:    0.157 m  AORTA Ao Root diam: 3.20 cm  SHUNTS Systemic VTI:  0.16 m Systemic Diam: 2.00 cm [name] MD Electronically signed by [name] MD Signature Date/Time: 04/16/2020/9:31:26 AM    Final    VAS US CAROTID (at [facility] and [facility] only)  Result Date: 04/19/2020 Carotid Arterial Duplex Study Indications:       CVA. Risk Factors:      Hypertension, Diabetes. Comparison Study:  no prior Performing Technologist: Abram Sander RVS  Examination Guidelines: A complete evaluation includes B-mode imaging, spectral Doppler, color Doppler, and power Doppler as needed of all accessible portions of each vessel. Bilateral testing is considered an integral part of a complete examination. Limited examinations for reoccurring indications may be performed as noted.  Right Carotid Findings:  +----------+--------+--------+--------+------------------+--------+           PSV cm/sEDV cm/sStenosisPlaque DescriptionComments +----------+--------+--------+--------+------------------+--------+ CCA Prox  100     19              heterogenous               +----------+--------+--------+--------+------------------+--------+ CCA Distal76      14              heterogenous               +----------+--------+--------+--------+------------------+--------+ ICA Prox  66      28      1-39%   heterogenous      tortuous +----------+--------+--------+--------+------------------+--------+ ICA Distal59      23                                         +----------+--------+--------+--------+------------------+--------+ ECA       104     11                                         +----------+--------+--------+--------+------------------+--------+ +----------+--------+-------+--------+-------------------+           PSV cm/sEDV cmsDescribeArm Pressure (mmHG) +----------+--------+-------+--------+-------------------+ Subclavian102                                        +----------+--------+-------+--------+-------------------+ +---------+--------+--+--------+--+---------+ VertebralPSV cm/s66EDV cm/s21Antegrade +---------+--------+--+--------+--+---------+  Left Carotid Findings: +----------+--------+--------+--------+------------------+--------+           PSV cm/sEDV cm/sStenosisPlaque DescriptionComments +----------+--------+--------+--------+------------------+--------+ CCA Prox  106     22              heterogenous               +----------+--------+--------+--------+------------------+--------+ CCA Distal96      22              heterogenous               +----------+--------+--------+--------+------------------+--------+ ICA Prox  75      25      1-39%   heterogenous      tortuous  +----------+--------+--------+--------+------------------+--------+ ICA Distal97      37                                         +----------+--------+--------+--------+------------------+--------+ ECA       86      11                                         +----------+--------+--------+--------+------------------+--------+ +----------+--------+--------+--------+-------------------+           PSV cm/sEDV cm/sDescribeArm Pressure (mmHG) +----------+--------+--------+--------+-------------------+ Subclavian206                                         +----------+--------+--------+--------+-------------------+ +---------+--------+--+--------+--+---------+ VertebralPSV cm/s44EDV cm/s18Antegrade +---------+--------+--+--------+--+---------+   Summary: Right Carotid: Velocities in the right ICA are consistent with a 1-39% stenosis. Left Carotid: Velocities in the left ICA  are consistent with a 1-39% stenosis. Vertebrals: Bilateral vertebral arteries demonstrate antegrade flow. *See table(s) above for measurements and observations.  Electronically signed by Antony Contras MD on 04/19/2020 at 12:33:14 PM.    Final    VAS [place] LOWER EXTREMITY VENOUS (DVT)  Result Date: 04/19/2020  Lower Venous DVTStudy Indications: Stroke.  Comparison Study: no prior Performing Technologist: Abram Sander RVS  Examination Guidelines: A complete evaluation includes B-mode imaging, spectral Doppler, color Doppler, and power Doppler as needed of all accessible portions of each vessel. Bilateral testing is considered an integral part of a complete examination. Limited examinations for reoccurring indications may be performed as noted. The reflux portion of the exam is performed with the patient in reverse Trendelenburg.  +---------+---------------+---------+-----------+----------+--------------+ RIGHT    CompressibilityPhasicitySpontaneityPropertiesThrombus Aging  +---------+---------------+---------+-----------+----------+--------------+ CFV      Full           Yes      Yes                                 +---------+---------------+---------+-----------+----------+--------------+ SFJ      Full                                                        +---------+---------------+---------+-----------+----------+--------------+ FV Prox  Full                                                        +---------+---------------+---------+-----------+----------+--------------+ FV Mid   Full                                                        +---------+---------------+---------+-----------+----------+--------------+ FV DistalFull                                                        +---------+---------------+---------+-----------+----------+--------------+ PFV      Full                                                        +---------+---------------+---------+-----------+----------+--------------+ POP      Full           Yes      Yes                                 +---------+---------------+---------+-----------+----------+--------------+ PTV      Full                                                        +---------+---------------+---------+-----------+----------+--------------+  PERO     Full                                                        +---------+---------------+---------+-----------+----------+--------------+   +---------+---------------+---------+-----------+----------+--------------+ LEFT     CompressibilityPhasicitySpontaneityPropertiesThrombus Aging +---------+---------------+---------+-----------+----------+--------------+ CFV      Full           Yes      Yes                                 +---------+---------------+---------+-----------+----------+--------------+ SFJ      Full                                                         +---------+---------------+---------+-----------+----------+--------------+ FV Prox  Full                                                        +---------+---------------+---------+-----------+----------+--------------+ FV Mid   Full                                                        +---------+---------------+---------+-----------+----------+--------------+ FV DistalFull                                                        +---------+---------------+---------+-----------+----------+--------------+ PFV      Full                                                        +---------+---------------+---------+-----------+----------+--------------+ POP      Full           Yes      Yes                                 +---------+---------------+---------+-----------+----------+--------------+ PTV      Full                                                        +---------+---------------+---------+-----------+----------+--------------+ PERO     Full                                                        +---------+---------------+---------+-----------+----------+--------------+  Summary: BILATERAL: - No evidence of deep vein thrombosis seen in the lower extremities, bilaterally. - No evidence of superficial venous thrombosis in the lower extremities, bilaterally. -   *See table(s) above for measurements and observations. Electronically signed by Curt Jews MD on 04/19/2020 at 5:06:45 PM.    Final    VAS [place] UPPER EXTREMITY VENOUS DUPLEX  Result Date: 04/19/2020 UPPER VENOUS STUDY  Indications: Swelling Comparison Study: no prior Performing Technologist: Abram Sander RVS  Examination Guidelines: A complete evaluation includes B-mode imaging, spectral Doppler, color Doppler, and power Doppler as needed of all accessible portions of each vessel. Bilateral testing is considered an integral part of a complete examination. Limited examinations for reoccurring indications  may be performed as noted.  Left Findings: +----------+------------+---------+-----------+----------+--------------+ LEFT      CompressiblePhasicitySpontaneousProperties   Summary     +----------+------------+---------+-----------+----------+--------------+ IJV           Full       Yes       Yes                             +----------+------------+---------+-----------+----------+--------------+ Subclavian    Full       Yes       Yes                             +----------+------------+---------+-----------+----------+--------------+ Axillary      Full       Yes       Yes                             +----------+------------+---------+-----------+----------+--------------+ Brachial      Full       Yes       Yes                             +----------+------------+---------+-----------+----------+--------------+ Radial        Full                                                 +----------+------------+---------+-----------+----------+--------------+ Ulnar         Full                                                 +----------+------------+---------+-----------+----------+--------------+ Cephalic                                            Not visualized +----------+------------+---------+-----------+----------+--------------+ Basilic                                             Not visualized +----------+------------+---------+-----------+----------+--------------+  Summary:  Left: No evidence of deep vein thrombosis in the upper extremity.  *See table(s) above for measurements and observations.  Diagnosing physician: Curt Jews MD Electronically signed by Curt Jews MD on 04/19/2020 at 5:06:34 PM.  Final     DISCHARGE EXAMINATION: Vitals:   04/21/20 2335 04/22/20 0401 04/22/20 0813 04/22/20 1129  BP: (!) 177/92 (!) 175/104 (!) 168/85 (!) 171/101  Pulse: 87 89 91 89  Resp: 17 19 18 18   Temp: 98.4 F (36.9 C) 98.7 F (37.1 C) 98 F (36.7 C)  98.6 F (37 C)  TempSrc: Oral  Oral Oral  SpO2: 100% 100% 100% 99%  Weight:      Height:       General appearance: Awake alert.  In no distress Resp: Clear to auscultation bilaterally.  Normal effort Cardio: S1-S2 is normal regular.  No S3-S4.  No rubs murmurs or bruit GI: Abdomen is soft.  Nontender nondistended.  Bowel sounds are present normal.  No masses organomegaly    DISPOSITION: CIR   Current Inpatient Medications:  Scheduled: .  stroke: mapping our early stages of recovery book   Does not apply Once  . amLODipine  10 mg Oral Daily  . aspirin  81 mg Oral Daily  . atorvastatin  80 mg Oral QHS  . enoxaparin (LOVENOX) injection  30 mg Subcutaneous Q24H  . insulin aspart  0-9 Units Subcutaneous TID WC  . insulin glargine  10 Units Subcutaneous Daily  . pantoprazole  40 mg Oral Daily  . polyethylene glycol  17 g Oral Daily  . sodium bicarbonate  1,300 mg Oral BID   Continuous:  UMP:[id number] **OR** acetaminophen (TYLENOL) oral liquid 160 mg/5 mL **OR** acetaminophen, hydrALAZINE, ondansetron (ZOFRAN) IV    Follow-up Information    Baldwin Jamaica, PA-C Follow up.   Specialty: Cardiology Why: 05/23/2020 @ 11:45AM, rediscuss heart monitor implant Contact information: [place] [address] [phone]               TOTAL DISCHARGE TIME: 35 minutes  [facility] Pager on [email or web address]  04/22/2020, 11:36 AM

## 2020-04-22 NOTE — Progress Notes (Signed)
Tony Ribas, MD  Physician  Physical Medicine and Rehabilitation  Consult Note     Signed  Date of Service:  04/18/2020  6:10 AM      Related encounter: ED to Hosp-Admission (Discharged) from 04/15/2020 in [facility]      Signed      Expand All Collapse All  Show:Clear all [x] Manual[x] Template[] Copied  Added by: [x] Angiulli, Lavon Paganini, PA-C[x] Raulkar, Clide Deutscher, MD  [] Hover for details          Physical Medicine and Rehabilitation Consult Reason for Consult: Left side weakness Referring Physician: [facility]     HPI: Tony Schmitt is a 49 y.o. right-handed male with history of diabetes mellitus, tobacco abuse, CKD stage III, hypertension as well as medical noncompliance.  Per chart review patient lives with mother.  1 level home 2 steps to entry.  Independent prior to admission.  Presented 04/15/2020 with left-sided weakness.  Blood pressure 176/109.  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  Punctate cortical infarct along the inferior left frontal operculum.  No flow signal in the distal left vertebral artery above the craniocervical junction.  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  Patient did not receive TPA.  Admission chemistries alcohol negative, urine drug screen negative, creatinine 2.92, glucose 227, BUN 31, hemoglobin A1c 9.6.  Echocardiogram with ejection fraction of 60 to 65% no wall motion abnormalities.  Currently maintained on aspirin for CVA prophylaxis.  Subcutaneous Lovenox for DVT prophylaxis.  TEE is pending.  Therapy evaluations completed with recommendations of physical medicine rehab consult.     Review of Systems  Constitutional: Negative for chills and fever.  HENT: Negative for hearing loss.   Eyes:  Negative for blurred vision and double vision.  Respiratory: Negative for cough and shortness of breath.   Cardiovascular: Negative for chest pain, palpitations and leg swelling.  Gastrointestinal: Positive for constipation. Negative for heartburn, nausea and vomiting.       GERD  Genitourinary: Negative for dysuria, flank pain and hematuria.  Musculoskeletal: Positive for myalgias.  Skin: Negative for rash.  Neurological: Positive for weakness.  All other systems reviewed and are negative.       Past Medical History:  Diagnosis Date  . Diabetes mellitus without complication ([place])    . Hypertension      History reviewed. No pertinent surgical history.      Family History  Problem Relation Age of Onset  . Diabetes Mother    . Diabetes Father    . Hypertension Father    . Cancer Father    . Heart failure Other      Social History:  reports that he has quit smoking. His smoking use included cigarettes and cigars. He has never used smokeless tobacco. He reports previous alcohol use. He reports that he does not use drugs. Allergies: No Known Allergies       Medications Prior to Admission  Medication Sig Dispense Refill  . glipiZIDE (GLUCOTROL) 5 MG tablet TAKE 1 TABLET BY MOUTH TWICE DAILY BEFORE A MEAL 60 tablet 0  . lisinopril (ZESTRIL) 20 MG tablet Take 1 tablet (20 mg total) by mouth daily. 90 tablet 0  . amLODipine (NORVASC) 10 MG tablet Take 1 tablet (10 mg total) by mouth daily. 30 tablet 3  .  metFORMIN (GLUCOPHAGE) 1000 MG tablet Take 1 tablet (1,000 mg total) by mouth 2 (two) times daily with a meal. 180 tablet 3  . omeprazole (PRILOSEC) 40 MG capsule Take 1 capsule (40 mg total) by mouth daily. 30 capsule 3  . ondansetron (ZOFRAN ODT) 4 MG disintegrating tablet Take 1 tablet (4 mg total) by mouth every 8 (eight) hours as needed for nausea or vomiting. (Patient not taking: Reported on 08/19/2019) 20 tablet 0  . rosuvastatin (CRESTOR) 20 MG tablet Take 1 tablet (20 mg total)  by mouth daily. 90 tablet 3      Home: Home Living Family/patient expects to be discharged to:: Private residence Living Arrangements: Parent Type of Home: House Home Access: Stairs to enter Technical brewer of Steps: 2 Entrance Stairs-Rails: Right Home Layout: One level Home Equipment: Environmental consultant - 2 wheels Additional Comments: tub/shower  Functional History: Prior Function Level of Independence: Independent Functional Status:  Mobility: Bed Mobility Overal bed mobility: Needs Assistance Bed Mobility: Supine to Sit, Sit to Supine Supine to sit: Mod assist Sit to supine: Min assist General bed mobility comments: Mod assist for hand hold to pull up into sitting and guide legs to side of bed. Transfers Overall transfer level: Needs assistance Equipment used: 1 person hand held assist Transfers: Sit to/from Stand Sit to Stand: Min assist General transfer comment: Min assist to stand and take steps to head of bed. Ambulation/Gait Ambulation/Gait assistance: Min assist Gait Distance (Feet): 25 Feet Assistive device: 1 person hand held assist Gait Pattern/deviations: Decreased step length - right, Decreased weight shift to left, Decreased dorsiflexion - left, Step-to pattern General Gait Details: cues to use R UE for support (provided HHA), pt unable to grip well L hand, observed decreased L DF and pt at times dragging left foot, remained in room   ADL: ADL Overall ADL's : Needs assistance/impaired Eating/Feeding: Set up Grooming: Set up Upper Body Bathing: Minimal assistance, Set up, Sitting Lower Body Bathing: Minimal assistance, Set up, Sit to/from stand Upper Body Dressing : Moderate assistance, Set up, Sitting Lower Body Dressing: Moderate assistance, Sit to/from stand Lower Body Dressing Details (indicate cue type and reason): Unable to donn socks at edge of stretcher. Performed pseudo pant activity and needed min assist. Toilet Transfer: Minimal assistance,  Stand-pivot, BSC Toileting- Clothing Manipulation and Hygiene: Minimal assistance Toileting - Clothing Manipulation Details (indicate cue type and reason): Reports wiping himself, partial assistance needed for clothing management   Cognition: Cognition Overall Cognitive Status: Within Functional Limits for tasks assessed Orientation Level: Oriented X4 Cognition Arousal/Alertness: Awake/alert Behavior During Therapy: WFL for tasks assessed/performed Overall Cognitive Status: Within Functional Limits for tasks assessed   Blood pressure (!) 193/93, pulse 87, temperature 98.8 F (37.1 C), temperature source Oral, resp. rate 17, height 5\' 4"  (1.626 m), weight 73.2 kg, SpO2 100 %. Physical Exam   General: Alert and oriented x 3, No apparent distress HEENT: Head is normocephalic, atraumatic, PERRLA, EOMI, sclera anicteric, oral mucosa pink and moist, dentition intact, ext ear canals clear,  Neck: Supple without JVD or lymphadenopathy Heart: Reg rate and rhythm. No murmurs rubs or gallops Chest: CTA bilaterally without wheezes, rales, or rhonchi; no distress Abdomen: Soft, non-tender, non-distended, bowel sounds positive. Extremities: No clubbing, cyanosis, or edema. Pulses are 2+ Skin: IV in right arm. Left arm edematous.  Neuro: Patient is awake alert oriented x3 and no acute distress.  Makes good eye contact with examiner and follows commands. LUE with 0/5 WE and hand grip. 2/5 1/5 EE,  EF, and SA. LLE with 0/5 DF/PF, 1/5 KE, 1/5 HF. Sensation intact Psych: Pt's affect is appropriate. Pt is cooperative. Good sense of humor.      Lab Results Last 24 Hours       Results for orders placed or performed during the hospital encounter of 04/15/20 (from the past 24 hour(s))  Glucose, capillary     Status: Abnormal    Collection Time: 04/17/20  6:17 AM  Result Value Ref Range    Glucose-Capillary 140 (H) 70 - 99 mg/dL  Magnesium     Status: None    Collection Time: 04/17/20  8:24 AM  Result  Value Ref Range    Magnesium 1.8 1.7 - 2.4 mg/dL  Basic metabolic panel     Status: Abnormal    Collection Time: 04/17/20  8:24 AM  Result Value Ref Range    Sodium 136 135 - 145 mmol/L    Potassium 5.7 (H) 3.5 - 5.1 mmol/L    Chloride 110 98 - 111 mmol/L    CO2 16 (L) 22 - 32 mmol/L    Glucose, Bld 107 (H) 70 - 99 mg/dL    BUN 30 (H) 6 - 20 mg/dL    Creatinine, Ser 3.08 (H) 0.61 - 1.24 mg/dL    Calcium 9.1 8.9 - 10.3 mg/dL    GFR calc non Af Amer 22 (L) >60 mL/min    GFR calc Af Amer 26 (L) >60 mL/min    Anion gap 10 5 - 15  CBC     Status: Abnormal    Collection Time: 04/17/20  8:24 AM  Result Value Ref Range    WBC 10.8 (H) 4.0 - 10.5 K/uL    RBC 3.91 (L) 4.22 - 5.81 MIL/uL    Hemoglobin 10.6 (L) 13.0 - 17.0 g/dL    HCT 33.1 (L) 39 - 52 %    MCV 84.7 80.0 - 100.0 fL    MCH 27.1 26.0 - 34.0 pg    MCHC 32.0 30.0 - 36.0 g/dL    RDW 14.4 11.5 - 15.5 %    Platelets 434 (H) 150 - 400 K/uL    nRBC 0.0 0.0 - 0.2 %  Glucose, capillary     Status: None    Collection Time: 04/17/20 12:15 PM  Result Value Ref Range    Glucose-Capillary 89 70 - 99 mg/dL    Comment 1 Notify RN      Comment 2 Document in Chart    Basic metabolic panel     Status: Abnormal    Collection Time: 04/17/20  2:50 PM  Result Value Ref Range    Sodium 137 135 - 145 mmol/L    Potassium 3.3 (L) 3.5 - 5.1 mmol/L    Chloride 108 98 - 111 mmol/L    CO2 22 22 - 32 mmol/L    Glucose, Bld 132 (H) 70 - 99 mg/dL    BUN 29 (H) 6 - 20 mg/dL    Creatinine, Ser 3.01 (H) 0.61 - 1.24 mg/dL    Calcium 9.0 8.9 - 10.3 mg/dL    GFR calc non Af Amer 23 (L) >60 mL/min    GFR calc Af Amer 27 (L) >60 mL/min    Anion gap 7 5 - 15  Glucose, capillary     Status: Abnormal    Collection Time: 04/17/20  3:47 PM  Result Value Ref Range    Glucose-Capillary 139 (H) 70 - 99 mg/dL    Comment 1 Notify RN  Comment 2 Document in Chart    Glucose, capillary     Status: Abnormal    Collection Time: 04/17/20  9:06 PM  Result Value  Ref Range    Glucose-Capillary 136 (H) 70 - 99 mg/dL       Imaging Results (Last 48 hours)  MR ANGIO HEAD WO CONTRAST   Result Date: 04/16/2020 CLINICAL DATA:  New onset of left upper extremity weakness. Stroke symptoms have progressed significantly overnight. EXAM: MRI HEAD WITHOUT CONTRAST MRA HEAD WITHOUT CONTRAST MRA NECK WITHOUT CONTRAST TECHNIQUE: Multiplanar, multiecho pulse sequences of the brain and surrounding structures were obtained without intravenous contrast. Angiographic images of the Circle of Willis were obtained using MRA technique without intravenous contrast. Angiographic images of the neck were obtained using MRA technique without intravenous contrast. Carotid stenosis measurements (when applicable) are obtained utilizing NASCET criteria, using the distal internal carotid diameter as the denominator. COMPARISON:  CT head without contrast 04/16/2020 FINDINGS: MRI HEAD FINDINGS Brain: The diffusion-weighted images confirm acute/subacute nonhemorrhagic infarcts involving the posterior right corona radiata. White matter infarct in the anterior aspect of the left external capsule, just anterior to the lentiform nucleus measures up to 13 mm. Punctate cortical infarct is present along the inferior left frontal operculum. T2 signal changes are associated with each of these areas. More remote lacunar infarct is present at the left globus pallidus. The basal ganglia are otherwise intact Periventricular and subcortical T2 signal changes are otherwise mildly advanced for age. The internal auditory canals are within normal limits. The brainstem and cerebellum are within normal limits. Vascular: The distal left vertebral artery is not visualized. Flow is otherwise present in the major intracranial arteries. Skull and upper cervical spine: Craniocervical junction is normal. Flow is present in the vertebral arteries bilaterally. Visualized intracranial contents are normal. Sinuses/Orbits: Right greater  than left mastoid effusion is present. The paranasal sinuses and mastoid air cells are otherwise clear. The globes and orbits are within normal limits. MRA HEAD FINDINGS Atherosclerotic irregularity is present in the cavernous internal carotid arteries bilaterally. 50-60% stenosis is present in the supraclinoid right ICA. No other significant stenoses are present through the ICA termini. The anterior communicating artery is patent. MCA bifurcations are intact. Asymmetric attenuation of distal left MCA branch vessels is noted without a significant proximal stenosis or occlusion. No flow signal is present in the left vertebral artery. Moderate to high-grade stenosis is present at the junction at the V3 and V4 segments with segmental narrowing of the V4 segment above this level. Moderate proximal basilar artery stenosis is present. Basilar artery is small, terminating at the superior cerebellar arteries. Both posterior cerebral arteries are of fetal type. Moderate stenoses are present in the right posterior communicating artery with asymmetric attenuation of distal PCA branch vessels on the right. MRA NECK FINDINGS Time of flight imaging demonstrates common origin of the left common carotid artery and innominate artery. No significant proximal stenoses are present. MCA bifurcations are intact without significant flow disturbance. Flow is antegrade in the vertebral arteries bilaterally. The right vertebral artery is the dominant vessel. No flow is present in the distal left vertebral artery above the craniocervical junction. IMPRESSION: 1. Acute/subacute nonhemorrhagic infarcts involving the posterior right corona radiata and anterior aspect of the left external capsule, corresponding with patient's symptoms. Right-sided infarcts likely impacts cortical spinal tracts involving left motor function. 2. Punctate cortical infarct along the inferior left frontal operculum. 3. Periventricular and subcortical T2 signal changes  are otherwise mildly advanced for age. This  likely reflects the sequela of chronic microvascular ischemia. 4. No flow signal in the distal left vertebral artery above the craniocervical junction. 5. Moderate to high-grade stenosis at the junction at the right V3 and V4 segments with segmental narrowing of the V4 segment above this level. 6. Moderate proximal basilar artery stenosis. 7. Atherosclerotic irregularity in the cavernous internal carotid arteries bilaterally with a 50-60% stenosis in the supraclinoid right ICA. 8. Moderate stenoses of the right posterior communicating artery with asymmetric attenuation of distal PCA branch vessels on the right. The right posterior cerebral artery is of fetal type. 9. No significant proximal stenosis in the neck. Electronically Signed   By: San Morelle M.D.   On: 04/16/2020 12:19    MR ANGIO NECK WO CONTRAST   Result Date: 04/16/2020 CLINICAL DATA:  New onset of left upper extremity weakness. Stroke symptoms have progressed significantly overnight. EXAM: MRI HEAD WITHOUT CONTRAST MRA HEAD WITHOUT CONTRAST MRA NECK WITHOUT CONTRAST TECHNIQUE: Multiplanar, multiecho pulse sequences of the brain and surrounding structures were obtained without intravenous contrast. Angiographic images of the Circle of Willis were obtained using MRA technique without intravenous contrast. Angiographic images of the neck were obtained using MRA technique without intravenous contrast. Carotid stenosis measurements (when applicable) are obtained utilizing NASCET criteria, using the distal internal carotid diameter as the denominator. COMPARISON:  CT head without contrast 04/16/2020 FINDINGS: MRI HEAD FINDINGS Brain: The diffusion-weighted images confirm acute/subacute nonhemorrhagic infarcts involving the posterior right corona radiata. White matter infarct in the anterior aspect of the left external capsule, just anterior to the lentiform nucleus measures up to 13 mm. Punctate  cortical infarct is present along the inferior left frontal operculum. T2 signal changes are associated with each of these areas. More remote lacunar infarct is present at the left globus pallidus. The basal ganglia are otherwise intact Periventricular and subcortical T2 signal changes are otherwise mildly advanced for age. The internal auditory canals are within normal limits. The brainstem and cerebellum are within normal limits. Vascular: The distal left vertebral artery is not visualized. Flow is otherwise present in the major intracranial arteries. Skull and upper cervical spine: Craniocervical junction is normal. Flow is present in the vertebral arteries bilaterally. Visualized intracranial contents are normal. Sinuses/Orbits: Right greater than left mastoid effusion is present. The paranasal sinuses and mastoid air cells are otherwise clear. The globes and orbits are within normal limits. MRA HEAD FINDINGS Atherosclerotic irregularity is present in the cavernous internal carotid arteries bilaterally. 50-60% stenosis is present in the supraclinoid right ICA. No other significant stenoses are present through the ICA termini. The anterior communicating artery is patent. MCA bifurcations are intact. Asymmetric attenuation of distal left MCA branch vessels is noted without a significant proximal stenosis or occlusion. No flow signal is present in the left vertebral artery. Moderate to high-grade stenosis is present at the junction at the V3 and V4 segments with segmental narrowing of the V4 segment above this level. Moderate proximal basilar artery stenosis is present. Basilar artery is small, terminating at the superior cerebellar arteries. Both posterior cerebral arteries are of fetal type. Moderate stenoses are present in the right posterior communicating artery with asymmetric attenuation of distal PCA branch vessels on the right. MRA NECK FINDINGS Time of flight imaging demonstrates common origin of the left  common carotid artery and innominate artery. No significant proximal stenoses are present. MCA bifurcations are intact without significant flow disturbance. Flow is antegrade in the vertebral arteries bilaterally. The right vertebral artery is the dominant  vessel. No flow is present in the distal left vertebral artery above the craniocervical junction. IMPRESSION: 1. Acute/subacute nonhemorrhagic infarcts involving the posterior right corona radiata and anterior aspect of the left external capsule, corresponding with patient's symptoms. Right-sided infarcts likely impacts cortical spinal tracts involving left motor function. 2. Punctate cortical infarct along the inferior left frontal operculum. 3. Periventricular and subcortical T2 signal changes are otherwise mildly advanced for age. This likely reflects the sequela of chronic microvascular ischemia. 4. No flow signal in the distal left vertebral artery above the craniocervical junction. 5. Moderate to high-grade stenosis at the junction at the right V3 and V4 segments with segmental narrowing of the V4 segment above this level. 6. Moderate proximal basilar artery stenosis. 7. Atherosclerotic irregularity in the cavernous internal carotid arteries bilaterally with a 50-60% stenosis in the supraclinoid right ICA. 8. Moderate stenoses of the right posterior communicating artery with asymmetric attenuation of distal PCA branch vessels on the right. The right posterior cerebral artery is of fetal type. 9. No significant proximal stenosis in the neck. Electronically Signed   By: San Morelle M.D.   On: 04/16/2020 12:19    MR Brain Wo Contrast (neuro protocol)   Result Date: 04/16/2020 CLINICAL DATA:  New onset of left upper extremity weakness. Stroke symptoms have progressed significantly overnight. EXAM: MRI HEAD WITHOUT CONTRAST MRA HEAD WITHOUT CONTRAST MRA NECK WITHOUT CONTRAST TECHNIQUE: Multiplanar, multiecho pulse sequences of the brain and  surrounding structures were obtained without intravenous contrast. Angiographic images of the Circle of Willis were obtained using MRA technique without intravenous contrast. Angiographic images of the neck were obtained using MRA technique without intravenous contrast. Carotid stenosis measurements (when applicable) are obtained utilizing NASCET criteria, using the distal internal carotid diameter as the denominator. COMPARISON:  CT head without contrast 04/16/2020 FINDINGS: MRI HEAD FINDINGS Brain: The diffusion-weighted images confirm acute/subacute nonhemorrhagic infarcts involving the posterior right corona radiata. White matter infarct in the anterior aspect of the left external capsule, just anterior to the lentiform nucleus measures up to 13 mm. Punctate cortical infarct is present along the inferior left frontal operculum. T2 signal changes are associated with each of these areas. More remote lacunar infarct is present at the left globus pallidus. The basal ganglia are otherwise intact Periventricular and subcortical T2 signal changes are otherwise mildly advanced for age. The internal auditory canals are within normal limits. The brainstem and cerebellum are within normal limits. Vascular: The distal left vertebral artery is not visualized. Flow is otherwise present in the major intracranial arteries. Skull and upper cervical spine: Craniocervical junction is normal. Flow is present in the vertebral arteries bilaterally. Visualized intracranial contents are normal. Sinuses/Orbits: Right greater than left mastoid effusion is present. The paranasal sinuses and mastoid air cells are otherwise clear. The globes and orbits are within normal limits. MRA HEAD FINDINGS Atherosclerotic irregularity is present in the cavernous internal carotid arteries bilaterally. 50-60% stenosis is present in the supraclinoid right ICA. No other significant stenoses are present through the ICA termini. The anterior communicating  artery is patent. MCA bifurcations are intact. Asymmetric attenuation of distal left MCA branch vessels is noted without a significant proximal stenosis or occlusion. No flow signal is present in the left vertebral artery. Moderate to high-grade stenosis is present at the junction at the V3 and V4 segments with segmental narrowing of the V4 segment above this level. Moderate proximal basilar artery stenosis is present. Basilar artery is small, terminating at the superior cerebellar arteries. Both posterior cerebral  arteries are of fetal type. Moderate stenoses are present in the right posterior communicating artery with asymmetric attenuation of distal PCA branch vessels on the right. MRA NECK FINDINGS Time of flight imaging demonstrates common origin of the left common carotid artery and innominate artery. No significant proximal stenoses are present. MCA bifurcations are intact without significant flow disturbance. Flow is antegrade in the vertebral arteries bilaterally. The right vertebral artery is the dominant vessel. No flow is present in the distal left vertebral artery above the craniocervical junction. IMPRESSION: 1. Acute/subacute nonhemorrhagic infarcts involving the posterior right corona radiata and anterior aspect of the left external capsule, corresponding with patient's symptoms. Right-sided infarcts likely impacts cortical spinal tracts involving left motor function. 2. Punctate cortical infarct along the inferior left frontal operculum. 3. Periventricular and subcortical T2 signal changes are otherwise mildly advanced for age. This likely reflects the sequela of chronic microvascular ischemia. 4. No flow signal in the distal left vertebral artery above the craniocervical junction. 5. Moderate to high-grade stenosis at the junction at the right V3 and V4 segments with segmental narrowing of the V4 segment above this level. 6. Moderate proximal basilar artery stenosis. 7. Atherosclerotic irregularity  in the cavernous internal carotid arteries bilaterally with a 50-60% stenosis in the supraclinoid right ICA. 8. Moderate stenoses of the right posterior communicating artery with asymmetric attenuation of distal PCA branch vessels on the right. The right posterior cerebral artery is of fetal type. 9. No significant proximal stenosis in the neck. Electronically Signed   By: San Morelle M.D.   On: 04/16/2020 12:19    ECHOCARDIOGRAM COMPLETE BUBBLE STUDY   Result Date: 04/16/2020    ECHOCARDIOGRAM REPORT   Patient Name:   DAMARKO STITELY Hortman Date of Exam: 04/16/2020 Medical Rec #:  [id number]     Height:       64.0 in Accession #:    [id number]    Weight:       156.0 lb Date of Birth:  1970-12-01     BSA:          1.760 m Patient Age:    89 years      BP:           199/106 mmHg Patient Gender: M             HR:           99 bpm. Exam Location:  Inpatient Procedure: 2D Echo Indications:    434.91 stroke  History:        Patient has prior history of Echocardiogram examinations, most                 recent 04/18/2019. Risk Factors:Hypertension, Diabetes and Former                 Smoker.  Sonographer:    Jannett Celestine RDCS (AE) Referring Phys: [place]  1. Left ventricular ejection fraction, by estimation, is 60 to 65%. The left ventricle has normal function. The left ventricle has no regional wall motion abnormalities. There is mild concentric left ventricular hypertrophy. Left ventricular diastolic function could not be evaluated.  2. Right ventricular systolic function is normal. The right ventricular size is normal. Tricuspid regurgitation signal is inadequate for assessing PA pressure.  3. The mitral valve is normal in structure. No evidence of mitral valve regurgitation. No evidence of mitral stenosis.  4. The aortic valve is normal in structure. Aortic valve regurgitation is not visualized. No aortic stenosis  is present.  5. The inferior vena cava is normal in size with greater than  50% respiratory variability, suggesting right atrial pressure of 3 mmHg. FINDINGS  Left Ventricle: Left ventricular ejection fraction, by estimation, is 60 to 65%. The left ventricle has normal function. The left ventricle has no regional wall motion abnormalities. The left ventricular internal cavity size was normal in size. There is  mild concentric left ventricular hypertrophy. Left ventricular diastolic function could not be evaluated. Right Ventricle: The right ventricular size is normal. No increase in right ventricular wall thickness. Right ventricular systolic function is normal. Tricuspid regurgitation signal is inadequate for assessing PA pressure. Left Atrium: Left atrial size was normal in size. Right Atrium: Right atrial size was normal in size. Pericardium: There is no evidence of pericardial effusion. Mitral Valve: The mitral valve is normal in structure. No evidence of mitral valve regurgitation. No evidence of mitral valve stenosis. Tricuspid Valve: The tricuspid valve is normal in structure. Tricuspid valve regurgitation is not demonstrated. No evidence of tricuspid stenosis. Aortic Valve: The aortic valve is normal in structure. Aortic valve regurgitation is not visualized. No aortic stenosis is present. Pulmonic Valve: The pulmonic valve was normal in structure. Pulmonic valve regurgitation is not visualized. No evidence of pulmonic stenosis. Aorta: The aortic root is normal in size and structure. Venous: The inferior vena cava is normal in size with greater than 50% respiratory variability, suggesting right atrial pressure of 3 mmHg. IAS/Shunts: No atrial level shunt detected by color flow Doppler.  LEFT VENTRICLE PLAX 2D LVIDd:         4.30 cm LVIDs:         2.40 cm LV PW:         1.20 cm LV IVS:        1.20 cm LVOT diam:     2.00 cm LV SV:         49 LV SV Index:   28 LVOT Area:     3.14 cm  RIGHT VENTRICLE RV S prime:     13.90 cm/s TAPSE (M-mode): 2.1 cm LEFT ATRIUM             Index LA  diam:        2.90 cm 1.65 cm/m LA Vol (A2C):   26.8 ml 15.22 ml/m LA Vol (A4C):   49.3 ml 28.01 ml/m LA Biplane Vol: 36.5 ml 20.73 ml/m  AORTIC VALVE LVOT Vmax:   89.50 cm/s LVOT Vmean:  65.200 cm/s LVOT VTI:    0.157 m  AORTA Ao Root diam: 3.20 cm  SHUNTS Systemic VTI:  0.16 m Systemic Diam: 2.00 cm [name] MD Electronically signed by [name] MD Signature Date/Time: 04/16/2020/9:31:26 AM    Final          Assessment/Plan: Diagnosis: Multiple bilateral infarcts 1. Does the need for close, 24 hr/day medical supervision in concert with the patient's rehab needs make it unreasonable for this patient to be served in a less intensive setting? Yes 2. Co-Morbidities requiring supervision/potential complications: HTN, DM, HLD, left sided weakness, overweight (BMI 27.7), anemia, CKD, mild leukocytosis, low grade tachycardia 3. Due to bladder management, bowel management, safety, skin/wound care, disease management, medication administration, pain management and patient education, does the patient require 24 hr/day rehab nursing? Yes 4. Does the patient require coordinated care of a physician, rehab nurse, therapy disciplines of PT, OT to address physical and functional deficits in the context of the above medical diagnosis(es)? Yes Addressing deficits in the  following areas: balance, endurance, locomotion, strength, transferring, bowel/bladder control, bathing, dressing, feeding, grooming, toileting and psychosocial support 5. Can the patient actively participate in an intensive therapy program of at least 3 hrs of therapy per day at least 5 days per week? Yes 6. The potential for patient to make measurable gains while on inpatient rehab is excellent 7. Anticipated functional outcomes upon discharge from inpatient rehab are modified independent  with PT, modified independent with OT, independent with SLP. 8. Estimated rehab length of stay to reach the above functional goals is: 12-16  days 9. Anticipated discharge destination: Home 10. Overall Rehab/Functional Prognosis: excellent   RECOMMENDATIONS: This patient's condition is appropriate for continued rehabilitative care in the following setting: CIR Patient has agreed to participate in recommended program. Yes Note that insurance prior authorization may be required for reimbursement for recommended care.   Comment:  Recommendations: 1) Left sided AFO and left sided resting hand splint   Thank you for this consult. Admission coordinator to follow.    I have personally performed a face to face diagnostic evaluation, including, but not limited to relevant history and physical exam findings, of this patient and developed relevant assessment and plan.  Additionally, I have reviewed and concur with the physician assistant's documentation above.   Leeroy Cha, MD   Cathlyn Parsons, PA-C 04/18/2020        Revision History                Note Details  Author Tony Ribas, MD File Time 04/18/2020  2:07 PM  Author Type Physician Status Signed  Last Editor Tony Ribas, MD Service [facility] # [id number] Admit Date 04/22/2020

## 2020-04-22 NOTE — Progress Notes (Signed)
Patient arrived on unit, oriented to unit. Reviewed medications, therapy schedule, rehab routine and plan of care. States an understanding of information reviewed. No complications noted at this time. Patient reports no pain and is AX4 Tony Schmitt  

## 2020-04-23 ENCOUNTER — Inpatient Hospital Stay (HOSPITAL_COMMUNITY): Payer: Self-pay | Admitting: Occupational Therapy

## 2020-04-23 ENCOUNTER — Inpatient Hospital Stay (HOSPITAL_COMMUNITY): Payer: Self-pay | Admitting: Physical Therapy

## 2020-04-23 DIAGNOSIS — I63311 Cerebral infarction due to thrombosis of right middle cerebral artery: Secondary | ICD-10-CM

## 2020-04-23 LAB — GLUCOSE, CAPILLARY
Glucose-Capillary: 123 mg/dL — ABNORMAL HIGH (ref 70–99)
Glucose-Capillary: 148 mg/dL — ABNORMAL HIGH (ref 70–99)
Glucose-Capillary: 122 mg/dL — ABNORMAL HIGH (ref 70–99)
Glucose-Capillary: 88 mg/dL (ref 70–99)

## 2020-04-23 MED ORDER — BISACODYL 10 MG RE SUPP
10.0000 mg | Freq: Once | RECTAL | Status: AC
  Administered 2020-04-23: 10 mg via RECTAL
  Filled 2020-04-23: qty 1

## 2020-04-23 MED ORDER — MAGNESIUM CITRATE PO SOLN
1.0000 | Freq: Once | ORAL | Status: AC
  Administered 2020-04-23: 1 via ORAL
  Filled 2020-04-23: qty 296

## 2020-04-23 NOTE — Evaluation (Signed)
Physical Therapy Assessment and Plan  Patient Details  Name: Tony Schmitt MRN: [id number] Date of Birth: 1970/08/17  PT Diagnosis: Abnormality of gait, Difficulty walking, Hemiparesis non-dominant, Impaired sensation and Muscle weakness Rehab Potential: Good ELOS: ~3 weeks   Today's Date: 04/23/2020 PT Individual Time: 1053-1203 PT Individual Time Calculation (min): 70 min    Hospital Problem: Principal Problem:   CVA (cerebral vascular accident) [facility])   Past Medical History:  Past Medical History:  Diagnosis Date  . Diabetes mellitus without complication ([place])   . Hypertension    Past Surgical History:  Past Surgical History:  Procedure Laterality Date  . BUBBLE STUDY  04/18/2020   Procedure: BUBBLE STUDY;  Surgeon: Sueanne Margarita, MD;  Location: [place];  Service: Cardiovascular;;  . TEE WITHOUT CARDIOVERSION N/A 04/18/2020   Procedure: TRANSESOPHAGEAL ECHOCARDIOGRAM (TEE);  Surgeon: Sueanne Margarita, MD;  Location: [facility] ENDOSCOPY;  Service: Cardiovascular;  Laterality: N/A;    Assessment & Plan Clinical Impression: Patient is a 49 y.o. year old right-handed male with history of diabetes mellitus, tobacco abuse, CKD stage III with creatinine baseline 2.42 08/02/2018, hypertension as well as medical noncompliance.  History taken from chart review and patient.  Patient lives with his mother.  1 level home 2 steps to entry.  Independent prior to admission.  He presented on 04/15/2020 with left hemiparesis.  Blood pressure noted to be 176/109 CT/MRI as well as MRA of the head and neck showed acute subacute nonhemorrhagic posterior right corona radiata and anterior aspect of left external capsule infarct.  Punctate cortical infarct along the inferior left frontal operculum.  No flow signal in the distal left vertebral artery above the craniocervical junction.  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  Carotid Dopplers with 1 to 39% ICA stenosis.  Patient did not receive TPA.  Admission chemistries alcohol negative, urine drug screen negative, creatinine 3.35, glucose 227, BUN 31, hemoglobin A1c 9.6.  Echocardiogram with ejection fraction of 60-65%.  No wall motion abnormalities.  Currently maintained on aspirin for CVA prophylaxis.  Subcutaneous Lovenox for DVT prophylaxis with lower extremity Dopplers negative for DVT.  TEE showed ejection fraction 65%, no evidence of thrombus possible very small PFO.  TCD study was completed negative for PFO.  No plans for loop recorder anymore.  Monitoring of renal function creatinine 3.10 maintained on IV fluids initially.  Tolerating a regular diet.  Therapy evaluations completed and patient was admitted for a comprehensive rehab program. Patient transferred to CIR on 04/22/2020 .   Patient currently requires max assist with mobility secondary to muscle weakness, muscle joint tightness and muscle paralysis, decreased cardiorespiratoy endurance, impaired timing and sequencing, abnormal tone and unbalanced muscle activation,  , decreased problem solving and delayed processing and decreased sitting balance, decreased standing balance, decreased postural control and decreased balance strategies.  Prior to hospitalization, patient was independent  with mobility and lived with Family (mother) in a House home.  Home access is 2Stairs to enter.  Patient will benefit from skilled PT intervention to maximize safe functional mobility, minimize fall risk and decrease caregiver burden for planned discharge home with 24 hour supervision.  Anticipate patient will benefit from follow up OP at discharge.  PT - End of Session Activity Tolerance: Tolerates 30+ min activity with multiple rests Endurance Deficit: Yes Endurance Deficit Description: requires seated rest breaks PT Assessment Rehab Potential (ACUTE/IP ONLY): Good PT Barriers to Discharge: Home environment  access/layout  PT Patient demonstrates impairments in the following area(s): Balance;Perception;Behavior;Safety;Edema;Sensory;Endurance;Skin Integrity;Motor;Nutrition;Pain PT Transfers Functional Problem(s): Bed to Chair;Bed Mobility;Car;Furniture PT Locomotion Functional Problem(s): Ambulation;Wheelchair Mobility;Stairs PT Plan PT Intensity: Minimum of 1-2 x/day ,45 to 90 minutes PT Frequency: 5 out of 7 days PT Duration Estimated Length of Stay: ~3 weeks PT Treatment/Interventions: Ambulation/gait training;Community reintegration;DME/adaptive equipment instruction;Neuromuscular re-education;Psychosocial support;Stair training;UE/LE Strength taining/ROM;Wheelchair propulsion/positioning;Balance/vestibular training;Discharge planning;Functional electrical stimulation;Pain management;Skin care/wound management;Therapeutic Activities;UE/LE Coordination activities;Cognitive remediation/compensation;Disease management/prevention;Functional mobility training;Patient/family education;Therapeutic Exercise;Splinting/orthotics;Visual/perceptual remediation/compensation PT Transfers Anticipated Outcome(s): supervision PT Locomotion Anticipated Outcome(s): CGA PT Recommendation Recommendations for Other Services: Therapeutic Recreation consult Therapeutic Recreation Interventions: Outing/community reintergration Follow Up Recommendations: Outpatient PT;24 hour supervision/assistance Patient destination: Home Equipment Recommended: To be determined   PT Evaluation Precautions/Restrictions Precautions Precautions: Fall;Other (comment) Precaution Comments: left hemiparesis Restrictions Weight Bearing Restrictions: No Pain Pain Assessment Pain Scale: 0-10 Pain Score: 0-No pain Home Living/Prior Functioning Home Living Available Help at Discharge: Available 24 hours/day;Family Type of Home: House Home Access: Stairs to enter Entrance Stairs-Number of Steps: 2 Entrance Stairs-Rails: None Home  Layout: One level Bathroom Shower/Tub: Tub/shower unit;Curtain Additional Comments: tub/shower  Lives With: Family (mother) Prior Function Level of Independence: Independent with gait;Independent with transfers;Independent with homemaking with ambulation  Able to Take Stairs?: Yes Driving: No Vocation: Unemployed Vocation Requirements: work in [employer] worker in 2019 until laid off during [place]: Hobbies-yes (Comment) Comments: likes to do yardwork, reading [employer] books  Perception  Perception Perception: Impaired Inattention/Neglect: Impaired-to be further tested in functional context (possible L hemibody inattention noticed with pt's L arm hanging off wheelchair) Praxis Praxis: Intact  Cognition  Overall Cognitive Status: Within Functional Limits for tasks assessed Arousal/Alertness: Awake/alert Orientation Level: Oriented X4 Attention: Focused;Sustained Focused Attention: Appears intact Safety/Judgment: Appears intact Sensation Sensation Light Touch: Appears Intact Hot/Cold: Not tested Proprioception: Impaired Detail Proprioception Impaired Details: Impaired LLE (to be further assessed) Stereognosis: Not tested Coordination Gross Motor Movements are Fluid and Coordinated: No Fine Motor Movements are Fluid and Coordinated: No Coordination and Movement Description: impaired due to L hemiparesis and impaired balance Heel Shin Test: unable to perform with L LE due to paresis Motor  Motor Motor: Hemiplegia;Abnormal postural alignment and control Motor - Skilled Clinical Observations: L hemiparesis (UE more impaired than LE)   Trunk/Postural Assessment  Cervical Assessment Cervical Assessment: Within Functional Limits Thoracic Assessment Thoracic Assessment: Within Functional Limits Lumbar Assessment Lumbar Assessment: Within Functional Limits Postural Control Postural Control: Deficits on evaluation Righting Reactions: delayed and insufficient Protective  Responses: delayed and insufficient Postural Limitations: decreased with L lateral lean  Balance Balance Balance Assessed: Yes Static Sitting Balance Static Sitting - Level of Assistance: 5: Stand by assistance Dynamic Sitting Balance Dynamic Sitting - Level of Assistance: 4: Min assist Static Standing Balance Static Standing - Level of Assistance: 4: Min assist;3: Mod assist Dynamic Standing Balance Dynamic Standing - Level of Assistance: 2: Max assist Extremity Assessment      RLE Assessment RLE Assessment: Within Functional Limits Active Range of Motion (AROM) Comments: WFL General Strength Comments: Grossly 4+/5 assessed in sitting LLE Assessment LLE Assessment: Exceptions to [facility] Passive Range of Motion (PROM) Comments: tightness noted in gastroc/soleus General Strength Comments: strength assessed in sitting LLE Strength Left Hip Flexion: 2+/5 Left Knee Flexion: 2-/5 Left Knee Extension: 2+/5 Left Ankle Dorsiflexion: 0/5 Left Ankle Plantar Flexion: 1/5  Care Tool Care Tool Bed Mobility Roll left and right activity   Roll left and right assist level: Contact Guard/Touching assist    Sit to lying activity  Sit to lying assist level: Minimal Assistance - Patient > 75%    Lying to sitting edge of bed activity   Lying to sitting edge of bed assist level: Minimal Assistance - Patient > 75%     Care Tool Transfers Sit to stand transfer   Sit to stand assist level: Moderate Assistance - Patient 50 - 74%    Chair/bed transfer   Chair/bed transfer assist level: Moderate Assistance - Patient 50 - 74%     Physiological scientist transfer assist level: Moderate Assistance - Patient 50 - 74%      Care Tool Locomotion Ambulation   Assist level: 2 helpers (mod A and +2 w/c follow) Assistive device: Other (comment) (R hallway rail) Max distance: 31f  Walk 10 feet activity   Assist level: 2 helpers (mod A and +2 w/c follow) Assistive device: Other  (comment) (R hallway rail)   Walk 50 feet with 2 turns activity Walk 50 feet with 2 turns activity did not occur: Safety/medical concerns      Walk 150 feet activity Walk 150 feet activity did not occur: Safety/medical concerns      Walk 10 feet on uneven surfaces activity Walk 10 feet on uneven surfaces activity did not occur: Safety/medical concerns      Stairs   Assist level: Moderate Assistance - Patient - 50 - 74% Stairs assistive device: 1 hand rail Max number of stairs: 1  Walk up/down 1 step activity   Walk up/down 1 step (curb) assist level: Moderate Assistance - Patient - 50 - 74% Walk up/down 1 step or curb assistive device: 1 hand rail Walk up/down 4 steps activity did not occuR: Safety/medical concerns  Walk up/down 4 steps activity      Walk up/down 12 steps activity Walk up/down 12 steps activity did not occur: Safety/medical concerns      Pick up small objects from floor Pick up small object from the floor (from standing position) activity did not occur: Safety/medical concerns      Wheelchair Will patient use wheelchair at discharge?:  (TBD) Type of Wheelchair: Manual   Wheelchair assist level: Minimal Assistance - Patient > 75%;Set up assist Max wheelchair distance: 1569f Wheel 50 feet with 2 turns activity   Assist Level: Minimal Assistance - Patient > 75%;Set up assist  Wheel 150 feet activity   Assist Level: Minimal Assistance - Patient > 75%;Set up assist    Refer to Care Plan for Long Term Goals  SHORT TERM GOAL WEEK 1 PT Short Term Goal 1 (Week 1): Pt will perform sit<>stands with min assist PT Short Term Goal 2 (Week 1): Pt will perform bed<>chair transfers with min assist PT Short Term Goal 3 (Week 1): Pt will ambulate at least 3053fsing LRAD with mod assist PT Short Term Goal 4 (Week 1): Pt will ascend/descend 4 steps using HRs with mod assist  Recommendations for other services: Therapeutic Recreation  Outing/community  reintegration  Skilled Therapeutic Intervention Evaluation completed (see details above) with education on PT POC and goals and individual treatment initiated with focus on bed mobility, transfers, gait training, L LE NMR, standing balance, activity tolerance, and pt education regarding daily therapy schedule, weekly team meetings, purpose of PT evaluation, fall risk safety, and other CIR information. Pt received sitting in w/c and agreeable to therapy session. Pt able to perform the below mobility tasks with assistance as described. During gait training therapist donned  L LE ankle DF assist ACE wrap to protect the joint and assist with foot clearance due to pt lacking ankle DF muscle activation - pt was able to advance L LE during swing with mod assist for positioning and widening BOS due to excessive hip adduction - therapist blocking L knee during stance with only moderate buckling noticed and no instances of hyperextension.  Performed the following L LE NMR tasks using mirror feedback: - repeated sit<>stands to/from EOM, no UE support, with min/mod assist x10 reps - manual facilitation and cuing for L weight shift - L LE stance control via R forward foot taps on/off 4" step progressed to R LE lateral foot taps on step requiring heavy mod assist for balance due to L lean and therapist blocking L knee buckle (no significant hyperextension noticed at this time) repeated cuing for L hip/knee extension, trunk upright and in midline, and weight shifting Therapist cuing throughout for proper set-up and sequencing of squat/stand pivot transfers to/from w/c with mod assist and manual facilitation for L LE weightbearing. Transported back to room in w/c and left seated with needs in reach, seat belt alarm on, L UE supported on 1/2 lap tray and NT present.  Mobility Bed Mobility Bed Mobility: Sit to Supine;Supine to Sit Supine to Sit: Minimal Assistance - Patient > 75% Sit to Supine: Minimal Assistance -  Patient > 75% Transfers Transfers: Sit to Stand;Stand to Sit;Stand Pivot Transfers;Squat Pivot Transfers Sit to Stand: Minimal Assistance - Patient > 75%;Moderate Assistance - Patient 50-74% Stand to Sit: Minimal Assistance - Patient > 75%;Moderate Assistance - Patient 50-74% Stand Pivot Transfers: Moderate Assistance - Patient 50 - 74% Stand Pivot Transfer Details: Verbal cues for technique;Verbal cues for sequencing;Visual cues/gestures for precautions/safety;Verbal cues for precautions/safety;Verbal cues for gait pattern;Manual facilitation for weight shifting;Manual facilitation for weight bearing;Manual facilitation for placement;Tactile cues for placement;Tactile cues for weight shifting;Tactile cues for sequencing Squat Pivot Transfers: Moderate Assistance - Patient 50-74% Transfer (Assistive device): None Locomotion  Gait Ambulation: Yes Gait Assistance: Moderate Assistance - Patient 50-74%;2 Helpers (at R hallway rail with +2 for w/c follow) Gait Distance (Feet): 20 Feet Assistive device: Other (Comment) (R hallway rail and L LE ankle DF assist ACE wrap) Gait Assistance Details: Verbal cues for sequencing;Verbal cues for technique;Verbal cues for gait pattern;Verbal cues for precautions/safety;Visual cues/gestures for sequencing;Tactile cues for sequencing;Tactile cues for placement;Tactile cues for weight shifting Gait Gait: Yes Gait Pattern: Impaired Gait Pattern: Step-through pattern;Decreased step length - right;Decreased step length - left;Decreased stance time - left;Decreased stride length;Decreased hip/knee flexion - left;Decreased dorsiflexion - left;Poor foot clearance - left;Lateral trunk lean to right;Decreased weight shift to left Gait velocity: decreased Stairs / Additional Locomotion Stairs: Yes Stairs Assistance: Moderate Assistance - Patient 50 - 74% Stair Management Technique: One rail Right Number of Stairs: 1 Height of Stairs: 6 Wheelchair Mobility Wheelchair  Mobility: Yes Wheelchair Assistance: Minimal assistance - Patient >75% Wheelchair Propulsion: Right upper extremity;Right lower extremity Wheelchair Parts Management: Needs assistance Distance: 170f      Discharge Criteria: Patient will be discharged from PT if patient refuses treatment 3 consecutive times without medical reason, if treatment goals not met, if there is a change in medical status, if patient makes no progress towards goals or if patient is discharged from hospital.  The above assessment, treatment plan, treatment alternatives and goals were discussed and mutually agreed upon: by patient  CTawana Scale, PT, DPT, CSRS  04/23/2020, 7:58 AM

## 2020-04-23 NOTE — Progress Notes (Signed)
Physical Therapy Session Note  Patient Details  Name: Tony Schmitt MRN: [id number] Date of Birth: Nov 07, 1970  Today's Date: 04/23/2020 PT Individual Time: 6283-6629 PT Individual Time Calculation (min): 53 min   Short Term Goals: Week 1:  PT Short Term Goal 1 (Week 1): Pt will perform sit<>stands with min assist PT Short Term Goal 2 (Week 1): Pt will perform bed<>chair transfers with min assist PT Short Term Goal 3 (Week 1): Pt will ambulate at least 27ft using LRAD with mod assist PT Short Term Goal 4 (Week 1): Pt will ascend/descend 4 steps using HRs with mod assist  Skilled Therapeutic Interventions/Progress Updates:   Pt received sitting in recliner and agreeable to therapy session. R stand pivot to w/c with mod assist for lifting and pivoting hips with manual facilitation for L LE management and guarding knee (no buckling).  Transported to/from gym in w/c for time management and energy conservation. Therapist donned L LE ankle DF assist ACE wrap. Gait training at R hallway rail with R UE support - min assist for balance and mod assist for L LE positioning during swing advancement due to excessive external rotation and none to very slight hip/knee flexion noted- guarding L knee during stance but no buckle or hyperextension noted. Gait training ~43ft x 3 along hallway wall for safety but no UE support and mod assist for balance with mod assist for L LE positioning/advancement during swing - demos increased L lateral trunk lean without R UE support - continues to have same L LE gait mechanic impairments with decreased hip/knee flexion though able to beginning advancing it via compensation with hip adductors causing LE external rotation - able to perform stand pivot to a chair at end of each gait trail with mod assist then perform transfer back to wheelchair to prepare for another gait trial along hallway wall (no +2 assist available). Standing, no UE support, L LE NMR via repeated L foot taps  on/off 6" step with mirror feedback - requires min/mod assist for balance and total assist for L LE placement on/off step with multimodal cuing for increased hamstring activation. Transitioned to seated L LE NMR via heel slides on ground with external target and washcloth under shoe to decrease friction x10 reps. Repeated L LE foot taps on/off step with pt demonstrating significant improvement now able to activate hamstrings to initiate lifting L foot off ground though still requires max/total assist to place on/off step. Transported back to room in w/c and pt requesting to get in bed. L stand pivot to EOB with mod assist for pivoting and L LE management. Sit>supine with CGA and increased time for L LE management onto bed. Left with needs in reach, L UE supported on pillows, and bed alarm on.  Therapy Documentation Precautions:  Precautions Precautions: Fall, Other (comment) Precaution Comments: left hemiparesis Restrictions Weight Bearing Restrictions: No  Pain:   No reports of pain throughout session.   Therapy/Group: Individual Therapy  Tawana Scale , PT, DPT, CSRS  04/23/2020, 5:28 PM

## 2020-04-23 NOTE — Progress Notes (Signed)
[place] PHYSICAL MEDICINE & REHABILITATION PROGRESS NOTE   Subjective/Complaints:  No issues except constipation , requests a laxative  ROS- neg CP, SOB, N/V/D  Objective:   No results found. Recent Labs    04/22/20 0136 04/22/20 1553  WBC 10.3 9.7  HGB 9.3* 9.6*  HCT 29.1* 30.7*  PLT 393 394   Recent Labs    04/21/20 0950 04/21/20 0950 04/22/20 0136 04/22/20 1553  NA 137  --  138  --   K 3.7  --  3.7  --   CL 109  --  109  --   CO2 21*  --  23  --   GLUCOSE 210*  --  156*  --   BUN 30*  --  32*  --   CREATININE 2.92*   < > 3.10* 3.21*  CALCIUM 8.7*  --  8.9  --    < > = values in this interval not displayed.    Intake/Output Summary (Last 24 hours) at 04/23/2020 1145 Last data filed at 04/23/2020 0820 Gross per 24 hour  Intake 297 ml  Output 850 ml  Net -553 ml        Physical Exam: Vital Signs Blood pressure (!) 147/79, pulse 85, temperature 98.1 F (36.7 C), temperature source Oral, resp. rate 16, height 5\' 4"  (1.626 m), weight 77.5 kg, SpO2 100 %.   General: No acute distress Mood and affect are appropriate Heart: Regular rate and rhythm no rubs murmurs or extra sounds Lungs: Clear to auscultation, breathing unlabored, no rales or wheezes Abdomen: Positive bowel sounds, soft nontender to palpation, nondistended Extremities: No clubbing, cyanosis, or edema Skin: No evidence of breakdown, no evidence of rash    Assessment/Plan: 1. Functional deficits secondary to RIght corona radiata infarct which require 3+ hours per day of interdisciplinary therapy in a comprehensive inpatient rehab setting.  Physiatrist is providing close team supervision and 24 hour management of active medical problems listed below.  Physiatrist and rehab team continue to assess barriers to discharge/monitor patient progress toward functional and medical goals  Care Tool:  Bathing  Bathing activity did not occur:  (N/A) Body parts bathed by patient: Chest, Abdomen,  Left arm, Front perineal area, Right upper leg, Left upper leg, Face   Body parts bathed by helper: Right arm, Buttocks, Right lower leg, Left lower leg     Bathing assist Assist Level: Moderate Assistance - Patient 50 - 74%     Upper Body Dressing/Undressing Upper body dressing   What is the patient wearing?: Pull over shirt    Upper body assist Assist Level: Maximal Assistance - Patient 25 - 49%    Lower Body Dressing/Undressing Lower body dressing      What is the patient wearing?: Pants, Incontinence brief     Lower body assist Assist for lower body dressing: Maximal Assistance - Patient 25 - 49%     Toileting Toileting    Toileting assist Assist for toileting: Maximal Assistance - Patient 25 - 49%     Transfers Chair/bed transfer  Transfers assist  Chair/bed transfer activity did not occur: N/A  Chair/bed transfer assist level: Moderate Assistance - Patient 50 - 74%     Locomotion Ambulation   Ambulation assist   Ambulation activity did not occur: N/A          Walk 10 feet activity   Assist  Walk 10 feet activity did not occur: N/A        Walk 50 feet activity  Assist Walk 50 feet with 2 turns activity did not occur: N/A         Walk 150 feet activity   Assist           Walk 10 feet on uneven surface  activity   Assist Walk 10 feet on uneven surfaces activity did not occur: N/A         Wheelchair     Assist               Wheelchair 50 feet with 2 turns activity    Assist            Wheelchair 150 feet activity     Assist          Blood pressure (!) 147/79, pulse 85, temperature 98.1 F (36.7 C), temperature source Oral, resp. rate 16, height 5\' 4"  (1.626 m), weight 77.5 kg, SpO2 100 %.    Medical Problem List and Plan: 1.  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               -patient may shower             -ELOS/Goals: 14-18 days/Supervision/Min A             Admit to CIR 2.  Antithrombotics: -DVT/anticoagulation: Lovenox             -antiplatelet therapy: Aspirin 81 mg daily 3. Pain Management: Tylenol as needed 4. Mood: Provide emotional support             -antipsychotic agents: N/A 5. Neuropsych: This patient is capable of making decisions on his own behalf. 6. Skin/Wound Care: Routine skin checks 7. Fluids/Electrolytes/Nutrition: Routine in and outs.  CMP ordered. 8.  Hypertension.  Norvasc 10 mg daily, Lopressor 12.5 mg twice daily          Vitals:   04/23/20 0444 04/23/20 0915  BP: (!) 164/92 (!) 147/79  Pulse: 86 85  Resp: 17 16  Temp: 98.7 F (37.1 C) 98.1 F (36.7 C)  SpO2: 99% 100%  elevated will allow permissive HTN for 1-2 weeks post CVA 9.  Diabetes mellitus.  Hemoglobin A1c 9.6.  Lantus insulin 10 units daily.  Check blood sugars before meals and at bedtime.  Diabetic teaching             CBG (last 3)  Recent Labs    04/22/20 1629 04/22/20 2106 04/23/20 0615  GLUCAP 156* 159* 123*  controlled 9/18  10.  CKD stage III.  Creatinine baseline 2.42 08/02/2018.  Renal ultrasound 04/17/2019 showed no hydronephrosis or shadowing stone.  Started on sodium bicarbonate 1300 mg twice daily 04/17/2020.               CMP ordered for tomorrow a.m. 11.  Tobacco abuse.  Counsel 12.  Hyperlipidemia: Lipitor 13.  Constipation pt requests laxative he can  drink , will order Mg Citrate LOS: 1 days A FACE TO FACE EVALUATION WAS PERFORMED  Charlett Blake 04/23/2020, 11:45 AM

## 2020-04-23 NOTE — Evaluation (Signed)
Occupational Therapy Assessment and Plan  Patient Details  Name: Tony Schmitt MRN: [id number] Date of Birth: 02/09/1971  OT Diagnosis: disturbance of vision and hemiplegia affecting non-dominant side Rehab Potential: Rehab Potential (ACUTE ONLY): Excellent ELOS: 21-23 days   Today's Date: 04/23/2020 OT Individual Time: 800-900 and  5885-0277 OT Individual Time Calculation (min): 60 min and 42 min     Hospital Problem: Principal Problem:   CVA (cerebral vascular accident) [facility])   Past Medical History:  Past Medical History:  Diagnosis Date  . Diabetes mellitus without complication ([place])   . Hypertension    Past Surgical History:  Past Surgical History:  Procedure Laterality Date  . BUBBLE STUDY  04/18/2020   Procedure: BUBBLE STUDY;  Surgeon: Sueanne Margarita, MD;  Location: [facility];  Service: Cardiovascular;;  . TEE WITHOUT CARDIOVERSION N/A 04/18/2020   Procedure: TRANSESOPHAGEAL ECHOCARDIOGRAM (TEE);  Surgeon: Sueanne Margarita, MD;  Location[facility] ENDOSCOPY;  Service: Cardiovascular;  Laterality: N/A;    Assessment & Plan Clinical Impression:Tony Schmitt is a 49 year old right-handed male with history of diabetes mellitus, tobacco abuse, CKD stage III with creatinine baseline 2.42 08/02/2018, hypertension as well as medical noncompliance.  History taken from chart review and patient.  Patient lives with his mother.  1 level home 2 steps to entry.  Independent prior to admission.  He presented on 04/15/2020 with left hemiparesis.  Blood pressure noted to be 176/109 CT/MRI as well as MRA of the head and neck showed acute subacute nonhemorrhagic posterior right corona radiata and anterior aspect of left external capsule infarct.  Punctate cortical infarct along the inferior left frontal operculum.  No flow signal in the distal left vertebral artery above the craniocervical junction.  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  Carotid Dopplers with 1 to 39% ICA stenosis.  Patient did not receive TPA.  Admission chemistries alcohol negative, urine drug screen negative, creatinine 3.35, glucose 227, BUN 31, hemoglobin A1c 9.6.  Echocardiogram with ejection fraction of 60-65%.  No wall motion abnormalities.  Currently maintained on aspirin for CVA prophylaxis.  Subcutaneous Lovenox for DVT prophylaxis with lower extremity Dopplers negative for DVT.  TEE showed ejection fraction 65%, no evidence of thrombus possible very small PFO.  TCD study was completed negative for PFO.  No plans for loop recorder anymore.  Monitoring of renal function creatinine 3.10 maintained on IV fluids initially.  Tolerating a regular diet.  Therapy evaluations completed and patient was admitted for a comprehensive rehab program.  Please see preadmission assessment from earlier today as well.    Patient transferred to CIR on 04/22/2020 .    Patient currently requires mod with basic self-care skills secondary to abnormal tone, unbalanced muscle activation and decreased coordination, decreased visual acuity and decreased sitting balance, decreased standing balance, hemiplegia and decreased balance strategies.  Prior to hospitalization, patient was fully independent.  Patient will benefit from skilled intervention to increase independence with basic self-care skills prior to discharge home with care partner.  Anticipate patient will require intermittent supervision and follow up outpatient.  OT - End of Session Activity Tolerance: Tolerates 30+ min activity with multiple rests Endurance Deficit: Yes Endurance Deficit Description: requires seated rest breaks OT Assessment Rehab Potential (ACUTE ONLY): Excellent OT Patient demonstrates impairments in the following area(s): Balance;Edema;Endurance;Motor;Vision OT Basic ADL's Functional Problem(s): Bathing;Dressing;Toileting OT Transfers Functional Problem(s):  Toilet;Tub/Shower OT Additional Impairment(s): Fuctional Use of Upper Extremity OT Plan OT Intensity:  Minimum of 1-2 x/day, 45 to 90 minutes OT Frequency: 5 out of 7 days OT Duration/Estimated Length of Stay: 21-23 days OT Treatment/Interventions: Balance/vestibular training;DME/adaptive equipment instruction;Community reintegration;Disease mangement/prevention;Discharge planning;Functional mobility training;Neuromuscular re-education;Psychosocial support;Patient/family education;Self Care/advanced ADL retraining;UE/LE Strength taining/ROM;Therapeutic Exercise;Therapeutic Activities;UE/LE Coordination activities;Visual/perceptual remediation/compensation OT Self Feeding Anticipated Outcome(s): independent OT Basic Self-Care Anticipated Outcome(s): supervision OT Toileting Anticipated Outcome(s): supervision OT Bathroom Transfers Anticipated Outcome(s): supervision OT Recommendation Patient destination: Home Follow Up Recommendations: Outpatient OT Equipment Recommended: Tub/shower bench   OT Evaluation Precautions/Restrictions   fall    Vital Signs Therapy Vitals Temp: 98.1 F (36.7 C) Temp Source: Oral Pulse Rate: 88 Resp: 17 BP: (!) 155/86 Patient Position (if appropriate): Sitting Oxygen Therapy SpO2: 99 % O2 Device: Room Air    Home Living/Prior [place] expects to be discharged to:: Private residence Living Arrangements: Parent Available Help at Discharge: Available 24 hours/day, Family Type of Home: House Home Access: Stairs to enter Technical brewer of Steps: 2 Entrance Stairs-Rails: None Home Layout: One level Bathroom Shower/Tub: Tub/shower unit, Curtain Additional Comments: tub/shower  Lives With: Family (mother) Prior Function Level of Independence: Independent with gait, Independent with transfers, Independent with homemaking with ambulation  Able to Take Stairs?: Yes Driving: No Vocation: Unemployed Vocation  Requirements: work in [employer] worker in 2019 until laid off during [place] Leisure: Hobbies-yes (Comment) Comments: likes to do yardwork, reading [employer] books Vision  c/o blurry vision for distance only.  Able to read articles on his ipad.  Perception  Perception: Impaired Inattention/Neglect: Impaired-to be further tested in functional context (possible L hemibody inattention noticed with pt's L arm hanging off wheelchair) Praxis Praxis: Intact Cognition Overall Cognitive Status: Within Functional Limits for tasks assessed Arousal/Alertness: Awake/alert Orientation Level: Person;Place;Situation Person: Oriented Place: Oriented Situation: Oriented Year: 2021 Month: September Day of Week: Correct Memory: Appears intact Immediate Memory Recall: Sock;Bed;Blue Memory Recall Sock: Without Cue Memory Recall Blue: Without Cue Memory Recall Bed: Without Cue Attention: Focused;Sustained Focused Attention: Appears intact Awareness: Appears intact Safety/Judgment: Appears intact Sensation Sensation Light Touch: Appears Intact Hot/Cold: Not tested Proprioception: Impaired Detail Proprioception Impaired Details: Impaired LLE (to be further assessed) Stereognosis: Not tested Coordination Gross Motor Movements are Fluid and Coordinated: No Coordination and Movement Description: impaired due to L hemiparesis and impaired balance Heel Shin Test: unable to perform with L LE due to paresis Motor  Motor Motor: Hemiplegia;Abnormal postural alignment and control Motor - Skilled Clinical Observations: L hemiparesis (UE more impaired than LE)  Trunk/Postural Assessment  Cervical Assessment Cervical Assessment: Within Functional Limits Thoracic Assessment Thoracic Assessment: Within Functional Limits Lumbar Assessment Lumbar Assessment: Within Functional Limits Postural Control Postural Control: Deficits on evaluation Righting Reactions: delayed and insufficient Protective Responses: delayed  and insufficient Postural Limitations: decreased with L lateral lean  Balance Balance Balance Assessed: Yes Static Sitting Balance Static Sitting - Level of Assistance: 5: Stand by assistance Dynamic Sitting Balance Dynamic Sitting - Level of Assistance: 4: Min assist Static Standing Balance Static Standing - Level of Assistance: 4: Min assist;3: Mod assist Dynamic Standing Balance Dynamic Standing - Level of Assistance: 2: Max assist Extremity/Trunk Assessment RUE Assessment RUE Assessment: Within Functional Limits LUE Assessment Passive Range of Motion (PROM) Comments: WFL Active Range of Motion (AROM) Comments: trace movement in scapula, sh, thumb General Strength Comments: mod edema in hand and forearm LUE Body System: Neuro Brunstrum levels for arm and hand: Arm;Hand Brunstrum level for arm: Stage I Presynergy Brunstrum level for hand: Stage I Flaccidity LUE Tone LUE  Tone: Mild  Care Tool Care Tool Self Care Eating   Eating Assist Level: Set up assist    Oral Care    Oral Care Assist Level: Set up assist    Bathing   Body parts bathed by patient: Chest;Abdomen;Left arm;Front perineal area;Right upper leg;Left upper leg;Face Body parts bathed by helper: Right arm;Buttocks;Right lower leg;Left lower leg   Assist Level: Moderate Assistance - Patient 50 - 74%    Upper Body Dressing(including orthotics)   What is the patient wearing?: Pull over shirt   Assist Level: Maximal Assistance - Patient 25 - 49%    Lower Body Dressing (excluding footwear)   What is the patient wearing?: Pants;Incontinence brief Assist for lower body dressing: Maximal Assistance - Patient 25 - 49%    Putting on/Taking off footwear   What is the patient wearing?: Ted hose;Shoes Assist for footwear: Total Assistance - Patient < 25%       Care Tool Toileting Toileting activity   Assist for toileting: Maximal Assistance - Patient 25 - 49%     Care Tool Bed Mobility Roll left and right  activity   Roll left and right assist level: Contact Guard/Touching assist    Sit to lying activity   Sit to lying assist level: Minimal Assistance - Patient > 75%    Lying to sitting edge of bed activity   Lying to sitting edge of bed assist level: Minimal Assistance - Patient > 75%     Care Tool Transfers Sit to stand transfer   Sit to stand assist level: Moderate Assistance - Patient 50 - 74%    Chair/bed transfer   Chair/bed transfer assist level: Moderate Assistance - Patient 50 - 74%     Toilet transfer         Care Tool Cognition Expression of Ideas and Wants Expression of Ideas and Wants: Without difficulty (complex and basic) - expresses complex messages without difficulty and with speech that is clear and easy to understand   Understanding Verbal and Non-Verbal Content Understanding Verbal and Non-Verbal Content: Understands (complex and basic) - clear comprehension without cues or repetitions   Memory/Recall Ability *first 3 days only Memory/Recall Ability *first 3 days only: Current season;Location of own room;That he or she is in a hospital/hospital unit;Staff names and faces    Refer to Care Plan for Long Term Goals  SHORT TERM GOAL WEEK 1 OT Short Term Goal 1 (Week 1): Pt will transfer to toilet with min A sq pivot. OT Short Term Goal 2 (Week 1): Pt will sit to stand with CGA during toileting. OT Short Term Goal 3 (Week 1): Pt will be able to manage clothing over hips with CGA. OT Short Term Goal 4 (Week 1): Pt will complete self ROM for LUE to prevent edema independently. OT Short Term Goal 5 (Week 1): Pt will be able to don a shirt with min A using some active L shoulder movement.  Recommendations for other services: None    Skilled Therapeutic Intervention ADL ADL Eating: Set up Grooming: Setup Upper Body Bathing: Moderate assistance Where Assessed-Upper Body Bathing: Chair Lower Body Bathing: Moderate assistance Where Assessed-Lower Body Bathing:  Chair Upper Body Dressing: Maximal assistance Where Assessed-Upper Body Dressing: Chair Lower Body Dressing: Maximal assistance Where Assessed-Lower Body Dressing: Chair Toileting: Maximal assistance Where Assessed-Toileting: Bedside Commode Toilet Transfer: Moderate assistance Toilet Transfer Method: Squat pivot;Stand pivot Toilet Transfer Equipment: Energy manager: Moderate assistance Social research officer, government Method: Education officer, environmental:  Transfer tub bench;Grab bars (simulated) Mobility  Bed Mobility Bed Mobility: Sit to Supine;Supine to Sit Supine to Sit: Minimal Assistance - Patient > 75% Sit to Supine: Minimal Assistance - Patient > 75% Transfers Sit to Stand: Minimal Assistance - Patient > 75%;Moderate Assistance - Patient 50-74% Stand to Sit: Minimal Assistance - Patient > 75%;Moderate Assistance - Patient 50-74%   Visit 1: no c/o pain. Pt seen for initial evaluation, ADL training, BSC transfers.  Discussed role of OT and pt's goals.  Pt participated extremely well and was able to hold himself in stand with RUE support.  Trace movement noted in LUE.  Once pt positioned in wc with lap tray, demonstrated with return demo for retrograde massage to LUE as pt has quite a bit of edema.  Pt in wc with alarm on and all needs met.  Visit 2: no c/o pain.  Due to high edema, wrapped fingers and hand in coban for continuous compression. Informed pt he can ask for help with removing it if it gets uncomfortable but to otherwise try to leave it on for a day.  Discussed positioning in wc, bed, recliner.  Pt practiced stand pivot to toilet with bar and squat pivot back to wc to his L with mod A and then practiced sq pivot to tub bench with bar with mod A.  Pt will be safe to do this transfer with A once he is ready to shower.  Pt then completed sq pivot to recliner. From recliner worked on a/arom of L scapula, shoulder, elbow.  Pt definitely has some activity in  triceps and lower traps.  Pt tolerating coban well with no change to skin color. Resting in recliner with belt alarm on and all needs met.    Discharge Criteria: Patient will be discharged from OT if patient refuses treatment 3 consecutive times without medical reason, if treatment goals not met, if there is a change in medical status, if patient makes no progress towards goals or if patient is discharged from hospital.  The above assessment, treatment plan, treatment alternatives and goals were discussed and mutually agreed upon: by patient  [facility] 04/23/2020, 3:59 PM

## 2020-04-24 LAB — GLUCOSE, CAPILLARY
Glucose-Capillary: 62 mg/dL — ABNORMAL LOW (ref 70–99)
Glucose-Capillary: 70 mg/dL (ref 70–99)
Glucose-Capillary: 143 mg/dL — ABNORMAL HIGH (ref 70–99)
Glucose-Capillary: 114 mg/dL — ABNORMAL HIGH (ref 70–99)
Glucose-Capillary: 156 mg/dL — ABNORMAL HIGH (ref 70–99)

## 2020-04-24 MED ORDER — INSULIN GLARGINE 100 UNIT/ML ~~LOC~~ SOLN
6.0000 [IU] | Freq: Every day | SUBCUTANEOUS | Status: DC
  Administered 2020-04-25 – 2020-04-28 (×4): 6 [IU] via SUBCUTANEOUS
  Filled 2020-04-24 (×4): qty 0.06

## 2020-04-24 NOTE — Progress Notes (Signed)
Hypoglycemic Event  CBG: 62  Treatment: 4 oz juice/soda  Symptoms: None  Follow-up CBG: [id number] CBG Result:70 Possible Reasons for Event: Inadequate meal intake  Comments/MD notified: will notify attending.    Sharetha Newson, [employer]

## 2020-04-24 NOTE — Progress Notes (Signed)
[facility] PHYSICAL MEDICINE & REHABILITATION PROGRESS NOTE   Subjective/Complaints:  Low CBG this am , no symptoms of shaking or dizziness  BM last noc   ROS- neg CP, SOB, N/V/D  Objective:   No results found. Recent Labs    04/22/20 0136 04/22/20 1553  WBC 10.3 9.7  HGB 9.3* 9.6*  HCT 29.1* 30.7*  PLT 393 394   Recent Labs    04/22/20 0136 04/22/20 1553  NA 138  --   K 3.7  --   CL 109  --   CO2 23  --   GLUCOSE 156*  --   BUN 32*  --   CREATININE 3.10* 3.21*  CALCIUM 8.9  --     Intake/Output Summary (Last 24 hours) at 04/24/2020 1139 Last data filed at 04/24/2020 7902 Gross per 24 hour  Intake 300 ml  Output 700 ml  Net -400 ml        Physical Exam: Vital Signs Blood pressure (!) 189/86, pulse 90, temperature 98.6 F (37 C), resp. rate 16, height 5\' 4"  (1.626 m), weight 77.5 kg, SpO2 99 %.   General: No acute distress Mood and affect are appropriate Heart: Regular rate and rhythm no rubs murmurs or extra sounds Lungs: Clear to auscultation, breathing unlabored, no rales or wheezes Abdomen: Positive bowel sounds, soft nontender to palpation, nondistended Extremities: No clubbing, cyanosis, or edema Skin: No evidence of breakdown, no evidence of rash  Motor 2- Left delt , bi tri, grip, 3- L HF, KE, 0/5 L ankle   Assessment/Plan: 1. Functional deficits secondary to RIght corona radiata infarct which require 3+ hours per day of interdisciplinary therapy in a comprehensive inpatient rehab setting.  Physiatrist is providing close team supervision and 24 hour management of active medical problems listed below.  Physiatrist and rehab team continue to assess barriers to discharge/monitor patient progress toward functional and medical goals  Care Tool:  Bathing  Bathing activity did not occur:  (N/A) Body parts bathed by patient: Chest, Abdomen, Left arm, Front perineal area, Right upper leg, Left upper leg, Face   Body parts bathed by helper:  Right arm, Buttocks, Right lower leg, Left lower leg     Bathing assist Assist Level: Moderate Assistance - Patient 50 - 74%     Upper Body Dressing/Undressing Upper body dressing   What is the patient wearing?: Pull over shirt    Upper body assist Assist Level: Maximal Assistance - Patient 25 - 49%    Lower Body Dressing/Undressing Lower body dressing      What is the patient wearing?: Pants, Incontinence brief     Lower body assist Assist for lower body dressing: Maximal Assistance - Patient 25 - 49%     Toileting Toileting    Toileting assist Assist for toileting: Maximal Assistance - Patient 25 - 49%     Transfers Chair/bed transfer  Transfers assist  Chair/bed transfer activity did not occur: N/A  Chair/bed transfer assist level: Moderate Assistance - Patient 50 - 74%     Locomotion Ambulation   Ambulation assist   Ambulation activity did not occur: N/A  Assist level: 2 helpers (mod A and +2 w/c follow) Assistive device: Other (comment) (R hallway rail) Max distance: 60ft   Walk 10 feet activity   Assist  Walk 10 feet activity did not occur: N/A  Assist level: 2 helpers (mod A and +2 w/c follow) Assistive device: Other (comment) (R hallway rail)   Walk 50 feet activity  Assist Walk 50 feet with 2 turns activity did not occur: Safety/medical concerns         Walk 150 feet activity   Assist Walk 150 feet activity did not occur: Safety/medical concerns         Walk 10 feet on uneven surface  activity   Assist Walk 10 feet on uneven surfaces activity did not occur: Safety/medical concerns         Wheelchair     Assist Will patient use wheelchair at discharge?:  (TBD) Type of Wheelchair: Manual    Wheelchair assist level: Minimal Assistance - Patient > 75%, Set up assist Max wheelchair distance: 142ft    Wheelchair 50 feet with 2 turns activity    Assist        Assist Level: Minimal Assistance - Patient >  75%, Set up assist   Wheelchair 150 feet activity     Assist      Assist Level: Minimal Assistance - Patient > 75%, Set up assist   Blood pressure (!) 189/86, pulse 90, temperature 98.6 F (37 C), resp. rate 16, height 5\' 4"  (1.626 m), weight 77.5 kg, SpO2 99 %.    Medical Problem List and Plan: 1.  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              -patient may shower             -ELOS/Goals: 14-18 days/Supervision/Min A             Admit to CIR 2.  Antithrombotics: -DVT/anticoagulation: Lovenox             -antiplatelet therapy: Aspirin 81 mg daily 3. Pain Management: Tylenol as needed 4. Mood: Provide emotional support             -antipsychotic agents: N/A 5. Neuropsych: This patient is capable of making decisions on his own behalf. 6. Skin/Wound Care: Routine skin checks 7. Fluids/Electrolytes/Nutrition: Routine in and outs.  CMP ordered. 8.  Hypertension.  Norvasc 10 mg daily, Lopressor 12.5 mg twice daily          Vitals:   04/23/20 1923 04/24/20 0307  BP: (!) 172/88 (!) 189/86  Pulse: 93 90  Resp: 16 16  Temp: 98.9 F (37.2 C) 98.6 F (37 C)  SpO2: 99% 99%  elevated will allow permissive HTN for 1-2 weeks post CVA 9.  Diabetes mellitus.  Hemoglobin A1c 9.6.  Lantus insulin 10 units daily.  Check blood sugars before meals and at bedtime.  Diabetic teaching             CBG (last 3)  Recent Labs    04/23/20 2050 04/24/20 0550 04/24/20 0619  GLUCAP 88 62* 70  reduce lantus to 6U qhs   10.  CKD stage III.  Creatinine baseline 2.42 08/02/2018.  Renal ultrasound 04/17/2019 showed no hydronephrosis or shadowing stone.  Started on sodium bicarbonate 1300 mg twice daily 04/17/2020.               CMP ordered for tomorrow a.m. 11.  Tobacco abuse.  Counsel 12.  Hyperlipidemia: Lipitor 13.  Constipation pt requests laxative he can  drink , will order Mg  Citrate LOS: 2 days A FACE TO FACE EVALUATION WAS PERFORMED  Charlett Blake 04/24/2020, 11:39 AM

## 2020-04-25 ENCOUNTER — Inpatient Hospital Stay (HOSPITAL_COMMUNITY): Payer: Self-pay | Admitting: Occupational Therapy

## 2020-04-25 ENCOUNTER — Inpatient Hospital Stay (HOSPITAL_COMMUNITY): Payer: Self-pay

## 2020-04-25 LAB — CBC WITH DIFFERENTIAL/PLATELET
Abs Immature Granulocytes: 0.09 10*3/uL — ABNORMAL HIGH (ref 0.00–0.07)
Basophils Absolute: 0.1 10*3/uL (ref 0.0–0.1)
Basophils Relative: 1 %
Eosinophils Absolute: 0.3 10*3/uL (ref 0.0–0.5)
Eosinophils Relative: 2 %
HCT: 25.9 % — ABNORMAL LOW (ref 39.0–52.0)
Hemoglobin: 8.1 g/dL — ABNORMAL LOW (ref 13.0–17.0)
Immature Granulocytes: 1 %
Lymphocytes Relative: 22 %
Lymphs Abs: 2.4 10*3/uL (ref 0.7–4.0)
MCH: 27 pg (ref 26.0–34.0)
MCHC: 31.3 g/dL (ref 30.0–36.0)
MCV: 86.3 fL (ref 80.0–100.0)
Monocytes Absolute: 0.7 10*3/uL (ref 0.1–1.0)
Monocytes Relative: 6 %
Neutro Abs: 7.6 10*3/uL (ref 1.7–7.7)
Neutrophils Relative %: 68 %
Platelets: 306 10*3/uL (ref 150–400)
RBC: 3 MIL/uL — ABNORMAL LOW (ref 4.22–5.81)
RDW: 14.3 % (ref 11.5–15.5)
WBC: 11.2 10*3/uL — ABNORMAL HIGH (ref 4.0–10.5)
nRBC: 0 % (ref 0.0–0.2)

## 2020-04-25 LAB — GLUCOSE, CAPILLARY
Glucose-Capillary: 139 mg/dL — ABNORMAL HIGH (ref 70–99)
Glucose-Capillary: 118 mg/dL — ABNORMAL HIGH (ref 70–99)
Glucose-Capillary: 177 mg/dL — ABNORMAL HIGH (ref 70–99)
Glucose-Capillary: 149 mg/dL — ABNORMAL HIGH (ref 70–99)

## 2020-04-25 LAB — COMPREHENSIVE METABOLIC PANEL
ALT: 34 U/L (ref 0–44)
AST: 30 U/L (ref 15–41)
Albumin: 1.5 g/dL — ABNORMAL LOW (ref 3.5–5.0)
Alkaline Phosphatase: 106 U/L (ref 38–126)
Anion gap: 8 (ref 5–15)
BUN: 31 mg/dL — ABNORMAL HIGH (ref 6–20)
CO2: 26 mmol/L (ref 22–32)
Calcium: 9.3 mg/dL (ref 8.9–10.3)
Chloride: 103 mmol/L (ref 98–111)
Creatinine, Ser: 3.24 mg/dL — ABNORMAL HIGH (ref 0.61–1.24)
GFR calc Af Amer: 24 mL/min — ABNORMAL LOW (ref >60)
GFR calc non Af Amer: 21 mL/min — ABNORMAL LOW (ref >60)
Glucose, Bld: 183 mg/dL — ABNORMAL HIGH (ref 70–99)
Potassium: 4.1 mmol/L (ref 3.5–5.1)
Sodium: 137 mmol/L (ref 135–145)
Total Bilirubin: 0.7 mg/dL (ref 0.3–1.2)
Total Protein: 5.2 g/dL — ABNORMAL LOW (ref 6.5–8.1)

## 2020-04-25 NOTE — Progress Notes (Signed)
[facility] Individual Statement of Services  Patient Name:  Tony Schmitt  Date:  04/25/2020  Welcome to the [facility].  Our goal is to provide you with an individualized program based on your diagnosis and situation, designed to meet your specific needs.  With this comprehensive rehabilitation program, you will be expected to participate in at least 3 hours of rehabilitation therapies Monday-Friday, with modified therapy programming on the weekends.  Your rehabilitation program will include the following services:  Physical Therapy (PT), Occupational Therapy (OT), Speech Therapy (ST), 24 hour per day rehabilitation nursing, Care Coordinator, Rehabilitation Medicine, Nutrition Services and Pharmacy Services  Weekly team conferences will be held on Wednesday to discuss your progress.  Your Inpatient Rehabilitation Care Coordinator will talk with you frequently to get your input and to update you on team discussions.  Team conferences with you and your family in attendance may also be held.  Expected length of stay:21-23 days  Overall anticipated outcome: supervision-cueing  Depending on your progress and recovery, your program may change. Your Inpatient Rehabilitation Care Coordinator will coordinate services and will keep you informed of any changes. Your Inpatient Rehabilitation Care Coordinator's name and contact numbers are listed  below.  The following services may also be recommended but are not provided by the [facility]:  [place] will be made to provide these services after discharge if needed.  Arrangements include referral to agencies that provide these services.  Your insurance has been verified to be:  Self Pay Your primary doctor is:  None  Pertinent information will be shared with your doctor and your  insurance company.  Inpatient Rehabilitation Care Coordinator:  Ovidio Kin, [place] or Emilia Beck  Information discussed with and copy given to patient by: Elease Hashimoto, 04/25/2020, 9:44 AM

## 2020-04-25 NOTE — Progress Notes (Signed)
Occupational Therapy Session Note  Patient Details  Name: Tony Schmitt MRN: [id number] Date of Birth: 31-Jul-1971  Today's Date: 04/25/2020 OT Individual Time: 1000-1100 OT Individual Time Calculation (min): 60 min    Short Term Goals: Week 1:  OT Short Term Goal 1 (Week 1): Pt will transfer to toilet with min A sq pivot. OT Short Term Goal 2 (Week 1): Pt will sit to stand with CGA during toileting. OT Short Term Goal 3 (Week 1): Pt will be able to manage clothing over hips with CGA. OT Short Term Goal 4 (Week 1): Pt will complete self ROM for LUE to prevent edema independently. OT Short Term Goal 5 (Week 1): Pt will be able to don a shirt with min A using some active L shoulder movement.  Skilled Therapeutic Interventions/Progress Updates:    Patient seated in w/c, pleasant and cooperative, denies pain.  Coban removed from hand and forearm.  Reviewed use of 1/2 lap tray and elevation for edema management.  SPT to/from mat table and w/c with min A.   Good seated balance with focus on posture, scapular mobility, trunk control and proximal strengthening.  Completed left UE facilitation, inhibition of flexor proximal and weight bearing/co-contraction proximal control activities.  Able to facilitate shoulder in all directions, elbow extension in seated and supine positions.  Returned to w/c where he remained at close of session, seat belt alarm set and callbell in hand.    Therapy Documentation Precautions:  Precautions Precautions: Fall, Other (comment) Precaution Comments: left hemiparesis Restrictions Weight Bearing Restrictions: No   Therapy/Group: Individual Therapy  Carlos Levering 04/25/2020, 7:36 AM

## 2020-04-25 NOTE — IPOC Note (Signed)
Overall Plan of Care [facility]) Patient Details Name: Tony Schmitt MRN: [id number] DOB: Dec 04, 1970  Admitting Diagnosis: CVA (cerebral vascular accident) [facility])  Hospital Problems: Principal Problem:   CVA (cerebral vascular accident) [facility])     Functional Problem List: Nursing Bladder, Bowel, Edema, Medication Management, Pain, Safety, Perception, Skin Integrity  PT Balance, Perception, Behavior, Safety, Edema, Sensory, Endurance, Skin Integrity, Motor, Nutrition, Pain  OT Balance, Edema, Endurance, Motor, Vision  SLP    TR         Basic ADL's: OT Bathing, Dressing, Toileting     Advanced  ADL's: OT       Transfers: PT Bed to Chair, Bed Mobility, Car, Manufacturing systems engineer, Metallurgist: PT Ambulation, Emergency planning/management officer, Stairs     Additional Impairments: OT Fuctional Use of Upper Extremity  SLP        TR      Anticipated Outcomes Item Anticipated Outcome  Self Feeding independent  Swallowing      Basic self-care  supervision  Toileting  supervision   Bathroom Transfers supervision  Bowel/Bladder  Supervision/min assist with continence  Transfers  supervision  Locomotion  CGA  Communication     Cognition     Pain  <3 on a 0-10 pain scale  Safety/Judgment  Supervision/min assist with safety and no falls    Therapy Plan: PT Intensity: Minimum of 1-2 x/day ,45 to 90 minutes PT Frequency: 5 out of 7 days PT Duration Estimated Length of Stay: ~3 weeks OT Intensity: Minimum of 1-2 x/day, 45 to 90 minutes OT Frequency: 5 out of 7 days OT Duration/Estimated Length of Stay: 21-23 days     Due to the current state of emergency, patients may not be receiving their 3-hours of Medicare-mandated therapy.   Team Interventions: Nursing Interventions Patient/Family Education, Bladder Management, Bowel Management, Disease Management/Prevention, Pain Management, Medication Management, Skin Care/Wound Management, Discharge Planning, Psychosocial  Support  PT interventions Ambulation/gait training, Community reintegration, DME/adaptive equipment instruction, Neuromuscular re-education, Psychosocial support, Stair training, UE/LE Strength taining/ROM, Wheelchair propulsion/positioning, Training and development officer, Discharge planning, Functional electrical stimulation, Pain management, Skin care/wound management, Therapeutic Activities, UE/LE Coordination activities, Cognitive remediation/compensation, Disease management/prevention, Functional mobility training, Patient/family education, Therapeutic Exercise, Splinting/orthotics, Visual/perceptual remediation/compensation  OT Interventions Training and development officer, DME/adaptive equipment instruction, Community reintegration, Disease mangement/prevention, Discharge planning, Functional mobility training, Neuromuscular re-education, Psychosocial support, Patient/family education, Self Care/advanced ADL retraining, UE/LE Strength taining/ROM, Therapeutic Exercise, Therapeutic Activities, UE/LE Coordination activities, Visual/perceptual remediation/compensation  SLP Interventions    TR Interventions    SW/CM Interventions Discharge Planning, Psychosocial Support, Patient/Family Education   Barriers to Discharge MD  Medical stability  Nursing Inaccessible home environment, Decreased caregiver support, Home environment access/layout, Incontinence, Lack of/limited family support, Medication compliance    PT Home environment access/layout    OT      SLP      SW Medication compliance, Other (comments) unisured will need to be set up with PCP prior to DC home   Team Discharge Planning: Destination: PT-Home ,OT- Home , SLP-  Projected Follow-up: PT-Outpatient PT, 24 hour supervision/assistance, OT-  Outpatient OT, SLP-  Projected Equipment Needs: PT-To be determined, OT- Tub/shower bench, SLP-  Equipment Details: PT- , OT-  Patient/family involved in discharge planning: PT- Patient,   OT-Patient, SLP-   MD ELOS: 14-18 days S/MinA Medical Rehab Prognosis:  Excellent Assessment: Tony Schmitt is a 49 year old man who is admitted with left sided hemiparesis secondary to acute/subacute nonhemorrhagic infarct of the posterior right corona  radiata and the anterior aspect of the left external capsule. Pain has been well controlled. He is on Lovenox for DVT prophylaxis. Blood pressure has been elevated but we are currently allowing permissive hypertension given proximity to stroke. He is on Lantus for type 2 DM and CBGs are being monitored regularly. Labs are being monitored given leukocytosis and stage III CKD. Outpatient nephrology follow-up has been scheduled.      See Team Conference Notes for weekly updates to the plan of care

## 2020-04-25 NOTE — Progress Notes (Signed)
Inpatient Rehabilitation  Patient information reviewed and entered into eRehab system by Harvie Morua M. Ronen Bromwell, M.A., CCC/SLP, PPS Coordinator.  Information including medical coding, functional ability and quality indicators will be reviewed and updated through discharge.    

## 2020-04-25 NOTE — Progress Notes (Signed)
[facility] PHYSICAL MEDICINE & REHABILITATION PROGRESS NOTE   Subjective/Complaints: No complaints this morning WBC up to 11.2 Hgb down to 8.1 from 9.6  ROS- denies CP, SOB, N/V/D  Objective:   No results found. Recent Labs    04/22/20 1553 04/25/20 0717  WBC 9.7 11.2*  HGB 9.6* 8.1*  HCT 30.7* 25.9*  PLT 394 306   Recent Labs    04/22/20 1553  CREATININE 3.21*    Intake/Output Summary (Last 24 hours) at 04/25/2020 1153 Last data filed at 04/25/2020 0900 Gross per 24 hour  Intake 500 ml  Output 1150 ml  Net -650 ml        Physical Exam: Vital Signs Blood pressure (!) 170/95, pulse 91, temperature 98.5 F (36.9 C), temperature source Oral, resp. rate 16, height 5\' 4"  (1.626 m), weight 77.5 kg, SpO2 98 %.  General: Alert and oriented x 3, No apparent distress HEENT: Head is normocephalic, atraumatic, PERRLA, EOMI, sclera anicteric, oral mucosa pink and moist, dentition intact, ext ear canals clear,  Neck: Supple without JVD or lymphadenopathy Heart: Reg rate and rhythm. No murmurs rubs or gallops Chest: CTA bilaterally without wheezes, rales, or rhonchi; no distress Abdomen: Soft, non-tender, non-distended, bowel sounds positive. Extremities: No clubbing, cyanosis, or edema. Pulses are 2+ Skin: Clean and intact without signs of breakdown Motor 2- Left delt , bi tri, grip, 3- L HF, KE, 0/5 L ankle Psych: Pt's affect is appropriate. Pt is cooperative      Assessment/Plan: 1. Functional deficits secondary to RIght corona radiata infarct which require 3+ hours per day of interdisciplinary therapy in a comprehensive inpatient rehab setting.  Physiatrist is providing close team supervision and 24 hour management of active medical problems listed below.  Physiatrist and rehab team continue to assess barriers to discharge/monitor patient progress toward functional and medical goals  Care Tool:  Bathing  Bathing activity did not occur:  (N/A) Body parts bathed  by patient: Chest, Abdomen, Left arm, Front perineal area, Right upper leg, Left upper leg, Face   Body parts bathed by helper: Right arm, Buttocks, Right lower leg, Left lower leg     Bathing assist Assist Level: Moderate Assistance - Patient 50 - 74%     Upper Body Dressing/Undressing Upper body dressing   What is the patient wearing?: Pull over shirt    Upper body assist Assist Level: Maximal Assistance - Patient 25 - 49%    Lower Body Dressing/Undressing Lower body dressing      What is the patient wearing?: Pants, Incontinence brief     Lower body assist Assist for lower body dressing: Maximal Assistance - Patient 25 - 49%     Toileting Toileting    Toileting assist Assist for toileting: Independent with assistive device     Transfers Chair/bed transfer  Transfers assist     Chair/bed transfer assist level: Moderate Assistance - Patient 50 - 74%     Locomotion Ambulation   Ambulation assist      Assist level: 2 helpers (mod A and +2 w/c follow) Assistive device: Other (comment) (R hallway rail) Max distance: 60ft   Walk 10 feet activity   Assist     Assist level: 2 helpers (mod A and +2 w/c follow) Assistive device: Other (comment) (R hallway rail)   Walk 50 feet activity   Assist Walk 50 feet with 2 turns activity did not occur: Safety/medical concerns         Walk 150 feet activity   Assist  Walk 150 feet activity did not occur: Safety/medical concerns         Walk 10 feet on uneven surface  activity   Assist Walk 10 feet on uneven surfaces activity did not occur: Safety/medical concerns         Wheelchair     Assist Will patient use wheelchair at discharge?: No (Per PT goals) Type of Wheelchair: Manual    Wheelchair assist level: Minimal Assistance - Patient > 75%, Set up assist Max wheelchair distance: 120ft    Wheelchair 50 feet with 2 turns activity    Assist        Assist Level: Minimal  Assistance - Patient > 75%, Set up assist   Wheelchair 150 feet activity     Assist      Assist Level: Minimal Assistance - Patient > 75%, Set up assist   Blood pressure (!) 170/95, pulse 91, temperature 98.5 F (36.9 C), temperature source Oral, resp. rate 16, height 5\' 4"  (1.626 m), weight 77.5 kg, SpO2 98 %.    Medical Problem List and Plan: 1.  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              -patient may shower             -ELOS/Goals: 14-18 days/Supervision/Min A             Continue CIR 2.  Antithrombotics: -DVT/anticoagulation: Lovenox             -antiplatelet therapy: Aspirin 81 mg daily 3. Pain Management: Tylenol as needed. Pain is well controlled 4. Mood: Provide emotional support             -antipsychotic agents: N/A 5. Neuropsych: This patient is capable of making decisions on his own behalf. 6. Skin/Wound Care: Routine skin checks 7. Fluids/Electrolytes/Nutrition: Routine in and outs.  CMP ordered. 8.  Hypertension.  Norvasc 10 mg daily, Lopressor 12.5 mg twice daily          Vitals:   04/24/20 1947 04/25/20 0510  BP: (!) 168/84 (!) 170/95  Pulse: 92 91  Resp: 16 16  Temp: 97.7 F (36.5 C) 98.5 F (36.9 C)  SpO2: 99% 98%  elevated will allow permissive HTN for 1-2 weeks post CVA 9.  Diabetes mellitus.  Hemoglobin A1c 9.6.  Lantus insulin 10 units daily.  Check blood sugars before meals and at bedtime.  Diabetic teaching             CBG (last 3)  Recent Labs    04/24/20 2059 04/25/20 0603 04/25/20 1136  GLUCAP 156* 139* 118*  continue lantus to 6U qhs   10.  CKD stage III.  Creatinine baseline 2.42 08/02/2018.  Renal ultrasound 04/17/2019 showed no hydronephrosis or shadowing stone.  Started on sodium bicarbonate 1300 mg twice daily 04/17/2020.               9/20: Cr up to 3.21, given steady increase, will request nephrology to evaluate.   11.  Tobacco abuse.  Counsel 12.  Hyperlipidemia: Lipitor 13.  Constipation pt requests laxative he can  drink , will order Mg Citrate 14. Leukocytosis: WBC up to 11.2 Afebrile. No concerns for infection. Continue to monitor.  LOS: 3 days A FACE TO FACE EVALUATION WAS PERFORMED  Clide Deutscher Willia Lampert 04/25/2020, 11:53 AM

## 2020-04-25 NOTE — Progress Notes (Signed)
Physical Therapy Session Note  Patient Details  Name: Tony Schmitt MRN: [id number] Date of Birth: [birth year]  Today's Date: 04/25/2020 PT Individual Time: 1700-1749 + 1430 - 1525 PT Individual Time Calculation (min): 30 min + 55 min  Short Term Goals: Week 1:  PT Short Term Goal 1 (Week 1): Pt will perform sit<>stands with min assist PT Short Term Goal 2 (Week 1): Pt will perform bed<>chair transfers with min assist PT Short Term Goal 3 (Week 1): Pt will ambulate at least 66ft using LRAD with mod assist PT Short Term Goal 4 (Week 1): Pt will ascend/descend 4 steps using HRs with mod assist  Skilled Therapeutic Interventions/Progress Updates:   1st session:   Pt received supine in bed, agreeable to PT session, denies any pain. Donned B TED hose with totalA for time management. Required maxA for donning pants with cues for figure-4 technique while supine, and totalA for donning shoes. Supine<>sit with minA with HOB slightly elevated with cues for sequencing and technique, able to maintain sitting EOB with close supervision without BUE support. Squat<>pivot transfer with modA and no AD from EOB to w/c and WC transport with totalA for time management from his room to dayroom gym. From there, performed neuro-re ed pre-gait training with mirror for visual feedback, including unsupported standing balance with lateral weight shifts and forward/backward stepping with RLE while therapist blocked L knee, requiring modA for truncal stability. Performed 1x8 sit<>stands with minA and L knee block from w/c with no AD, cues for R hand placement and making sure to have B feet underneath him, good carryover. WC transport back to his room where he remained seated in w/c with belt alarm on, LUE supported with pillow, needs in reach.   2nd session: Pt received sitting in w/c, agreeable to PT session, denies pain. Replaced slippers with tennis shoes with maxA for time management. WC transport with totalA from room to  ortho gym. Stand<>pivot transfer with minA from w/c to mat table (towards stronger R side). Able to maintain sitting EOM with supervision without BUE support. Performed 2x10 sit<>stand's with minA and L knee block and mirror placed in front for visual feedback. Worked on standing balance progressing from [place] to minA due to L lateral lean. Performed pre-gait training with targeted stepping using colored numbers with RLE, unsupported, requiring modA for truncal stability. Sit>supine with minA for LLE management and performed the following supine there-ex requiring AAROM for LLE: 2x10 bridges, 2x10 hip abduction, 2x10 heel slides. Supine<>sit with minA for LLE and trunk to upright, squat<>pivot with min/modA towards weaker L side from mat table to w/c. WC transport to hallway, performed gait training with L HR and modA for LLE facilitation with w/c follow for safety, 2x57ft (seated rest). VC for lateral weight shift, L foot placement, L knee flexion, and L quad activation during stance to prevent knee buckling. WC transport back to room, performed stand<>pivot with modA from w/c to EOB and sit>supine with minA. Pt ended session supine in bed, 3/4 bed rails up, bed alarm on, needs in reach.  Therapy Documentation Precautions:  Precautions Precautions: Fall, Other (comment) Precaution Comments: left hemiparesis Restrictions Weight Bearing Restrictions: No   Therapy/Group: Individual Therapy  Wesleigh Markovic P Tray Klayman PT 04/25/2020, 9:44 AM

## 2020-04-25 NOTE — Progress Notes (Signed)
Patient Details  Name: Tony Schmitt MRN: [id number] Date of Birth: 05-31-1971  Today's Date: 04/25/2020  Hospital Problems: Principal Problem:   CVA (cerebral vascular accident) [facility])  Past Medical History:  Past Medical History:  Diagnosis Date  . Diabetes mellitus without complication ([place])   . Hypertension    Past Surgical History:  Past Surgical History:  Procedure Laterality Date  . BUBBLE STUDY  04/18/2020   Procedure: BUBBLE STUDY;  Surgeon: Sueanne Margarita, MD;  Location: [place];  Service: Cardiovascular;;  . TEE WITHOUT CARDIOVERSION N/A 04/18/2020   Procedure: TRANSESOPHAGEAL ECHOCARDIOGRAM (TEE);  Surgeon: Sueanne Margarita, MD;  Location: [facility] ENDOSCOPY;  Service: Cardiovascular;  Laterality: N/A;   Social History:  reports that he has quit smoking. His smoking use included cigarettes and cigars. He has never used smokeless tobacco. He reports previous alcohol use. He reports that he does not use drugs.  Family / Support Systems Marital Status: Single Patient Roles: Other (Comment) (son and sibling) Other Supports: Noreen-Mom [phone]  [phone] Anticipated Caregiver: Mom Ability/Limitations of Caregiver: mom is retired and in good health, although pt does not want her to do physical care of him at discharge Caregiver Availability: 24/7 Family Dynamics: Close knit with Mom and is two sister's all of whom check in on him and make sure he has what he needs. He is the only boy in the family and has been spoiled.  Social History Preferred language: English Religion: Christian Cultural Background: No issues Education: HS Read: Yes Write: Yes Employment Status: Unemployed Public relations account executive Issues: no issues Guardian/Conservator: None-according to MD pt is capable of making ohis own decisions while here   Abuse/Neglect Abuse/Neglect Assessment Can Be Completed: Yes Physical Abuse: Denies Verbal Abuse: Denies Sexual Abuse:  Denies Exploitation of patient/patient's resources: Denies Self-Neglect: Denies  Emotional Status Pt's affect, behavior and adjustment status: Pt is motivated to do well and recover from this stroke, he reports he needs to take better care of himself and plans to now. He feels positive about his progress thus far. Recent Psychosocial Issues: other health issues and unemployed Psychiatric History: No issues pt seems to be coping appropriatley and motivated and glad to be here. Will re-assess and see if neuro-psych would be beneficial to see while here, due to young age Substance Abuse History: Tobacco aware MD recommends to quit and he plans to now, can't have here so has already begun  Patient / Family Perceptions, Expectations & Goals Pt/Family understanding of illness & functional limitations: Pt is able to explain his stroke and deficits. He is making progress and feels positive regarding his recovery. He talks with the MD rounding and feels has a good understanding of treatment plan going forward. Premorbid pt/family roles/activities: Son, brother, friend, etc Anticipated changes in roles/activities/participation: resume Pt/family expectations/goals: Pt states: " I want to be able to do for myself like I did before this."  Mom states: " I hope he does well here and will help but can only do so much."  [employer]: None Premorbid Home Care/DME Agencies: None Transportation available at discharge: Mom and sister's  Discharge Planning Living Arrangements: Parent Support Systems: Parent, Other relatives, Friends/neighbors Type of Residence: Private residence Insurance Resources: Self-pay (Applying for [employer] while here) Financial Screen Referred: Yes Living Expenses: Lives with family Money Management: Family Does the patient have any problems obtaining your medications?: Yes (Describe) (Uninsured) Home Management: Mom Patient/Family Preliminary Plans:  Return home with Mom who can provide assist if  needed. She is retired and can provide 24/7 but limited with the amount of physical can provide. Care Coordinator Barriers to Discharge: Medication compliance, Other (comments) Care Coordinator Barriers to Discharge Comments: unisured will need to be set up with PCP prior to DC home Care Coordinator Anticipated Follow Up Needs: HH/OP  Clinical Impression Pleasant gentleman who is motivated to do well here and regain his independence and not burden his Mom. Mom is very supportive and willing to assist him at discharge. Will need to connect him with PCP and do match for medications. Mom to apply for medicaid and disability if eligible for. May benefit from seeing neuro-psych while here for coping.  Elease Hashimoto 04/25/2020, 9:42 AM

## 2020-04-25 NOTE — Progress Notes (Signed)
Occupational Therapy Session Note  Patient Details  Name: Tony Schmitt MRN: [id number] Date of Birth: [birth year]  Today's Date: 04/25/2020 OT Individual Time: 1000-1100 OT Individual Time Calculation (min): 60 min    Short Term Goals: Week 1:  OT Short Term Goal 1 (Week 1): Pt will transfer to toilet with min A sq pivot. OT Short Term Goal 2 (Week 1): Pt will sit to stand with CGA during toileting. OT Short Term Goal 3 (Week 1): Pt will be able to manage clothing over hips with CGA. OT Short Term Goal 4 (Week 1): Pt will complete self ROM for LUE to prevent edema independently. OT Short Term Goal 5 (Week 1): Pt will be able to don a shirt with min A using some active L shoulder movement.  Skilled Therapeutic Interventions/Progress Updates:    Pt sitting up in w/c, no c/o pain.  Pt requesting to wash up.  Pt completed bathing and dressing tasks sitting/standing at sink with treatment focus on compensatory and hemitechnique training to increase independence.  Pt needed mod VCs to facilitate initiation of tasks and sequencing to next step.  Pt doffed shirt overhead with supervision.  Pt bathed UB with mod assist for back and to wash RUE using hand over hand technique to left hand with VCs to facilitate horizontal shoulder abduction.  Pt needed min assist for sit<>stand while blocking right knee to enable pt to bathe buttocks and pulls pants and underwear over hips. Pt needing max assist to donn/doff TED hose. Educated pt on one handed technique to donn regular socks and figure 4 position and pt return demonstrated needing mod assist to complete primarily due to toenails getting caught on fabric.  Pt sitting up in w/c, call bell in reach, seat belt alarm on.  Therapy Documentation Precautions:  Precautions Precautions: Fall, Other (comment) Precaution Comments: left hemiparesis Restrictions Weight Bearing Restrictions: No   Therapy/Group: Individual Therapy  [name] 04/25/2020,  12:59 PM

## 2020-04-26 ENCOUNTER — Inpatient Hospital Stay (HOSPITAL_COMMUNITY): Payer: Self-pay

## 2020-04-26 ENCOUNTER — Inpatient Hospital Stay (HOSPITAL_COMMUNITY): Payer: Self-pay | Admitting: Occupational Therapy

## 2020-04-26 DIAGNOSIS — E46 Unspecified protein-calorie malnutrition: Secondary | ICD-10-CM

## 2020-04-26 DIAGNOSIS — I10 Essential (primary) hypertension: Secondary | ICD-10-CM

## 2020-04-26 DIAGNOSIS — D72829 Elevated white blood cell count, unspecified: Secondary | ICD-10-CM

## 2020-04-26 DIAGNOSIS — E118 Type 2 diabetes mellitus with unspecified complications: Secondary | ICD-10-CM

## 2020-04-26 DIAGNOSIS — I6300 Cerebral infarction due to thrombosis of unspecified precerebral artery: Secondary | ICD-10-CM

## 2020-04-26 DIAGNOSIS — E8809 Other disorders of plasma-protein metabolism, not elsewhere classified: Secondary | ICD-10-CM

## 2020-04-26 DIAGNOSIS — IMO0002 Reserved for concepts with insufficient information to code with codable children: Secondary | ICD-10-CM

## 2020-04-26 DIAGNOSIS — D62 Acute posthemorrhagic anemia: Secondary | ICD-10-CM

## 2020-04-26 DIAGNOSIS — E1165 Type 2 diabetes mellitus with hyperglycemia: Secondary | ICD-10-CM

## 2020-04-26 DIAGNOSIS — N179 Acute kidney failure, unspecified: Secondary | ICD-10-CM

## 2020-04-26 DIAGNOSIS — K5901 Slow transit constipation: Secondary | ICD-10-CM

## 2020-04-26 LAB — RENAL FUNCTION PANEL
Albumin: 1.7 g/dL — ABNORMAL LOW (ref 3.5–5.0)
Anion gap: 11 (ref 5–15)
BUN: 33 mg/dL — ABNORMAL HIGH (ref 6–20)
CO2: 23 mmol/L (ref 22–32)
Calcium: 9.6 mg/dL (ref 8.9–10.3)
Chloride: 100 mmol/L (ref 98–111)
Creatinine, Ser: 3.07 mg/dL — ABNORMAL HIGH (ref 0.61–1.24)
GFR calc Af Amer: 26 mL/min — ABNORMAL LOW (ref >60)
GFR calc non Af Amer: 23 mL/min — ABNORMAL LOW (ref >60)
Glucose, Bld: 113 mg/dL — ABNORMAL HIGH (ref 70–99)
Phosphorus: 3.1 mg/dL (ref 2.5–4.6)
Potassium: 4.2 mmol/L (ref 3.5–5.1)
Sodium: 134 mmol/L — ABNORMAL LOW (ref 135–145)

## 2020-04-26 LAB — URINALYSIS, COMPLETE (UACMP) WITH MICROSCOPIC
Bacteria, UA: NONE SEEN
Bilirubin Urine: NEGATIVE
Bilirubin Urine: NEGATIVE
Glucose, UA: 150 mg/dL — AB
Glucose, UA: >=500 mg/dL — AB
Hgb urine dipstick: NEGATIVE
Ketones, ur: NEGATIVE mg/dL
Ketones, ur: NEGATIVE mg/dL
Leukocytes,Ua: NEGATIVE
Leukocytes,Ua: NEGATIVE
Nitrite: NEGATIVE
Nitrite: NEGATIVE
Protein, ur: >=300 mg/dL — AB
Protein, ur: >=300 mg/dL — AB
Specific Gravity, Urine: 1.014 (ref 1.005–1.030)
Specific Gravity, Urine: 1.011 (ref 1.005–1.030)
pH: 7 (ref 5.0–8.0)
pH: 7 (ref 5.0–8.0)

## 2020-04-26 LAB — PROTEIN / CREATININE RATIO, URINE
Creatinine, Urine: 81.7 mg/dL
Protein Creatinine Ratio: 12.66 mg/mg{Cre} — ABNORMAL HIGH (ref 0.00–0.15)
Total Protein, Urine: 1034 mg/dL

## 2020-04-26 LAB — SODIUM, URINE, RANDOM: Sodium, Ur: 93 mmol/L

## 2020-04-26 LAB — GLUCOSE, CAPILLARY
Glucose-Capillary: 96 mg/dL (ref 70–99)
Glucose-Capillary: 112 mg/dL — ABNORMAL HIGH (ref 70–99)
Glucose-Capillary: 182 mg/dL — ABNORMAL HIGH (ref 70–99)
Glucose-Capillary: 140 mg/dL — ABNORMAL HIGH (ref 70–99)

## 2020-04-26 LAB — CREATININE, URINE, RANDOM: Creatinine, Urine: 82.41 mg/dL

## 2020-04-26 MED ORDER — POLYETHYLENE GLYCOL 3350 17 G PO PACK
17.0000 g | PACK | Freq: Two times a day (BID) | ORAL | Status: DC
  Administered 2020-04-26 – 2020-05-11 (×11): 17 g via ORAL
  Filled 2020-04-26 (×24): qty 1

## 2020-04-26 MED ORDER — METOPROLOL TARTRATE 12.5 MG HALF TABLET
12.5000 mg | ORAL_TABLET | Freq: Two times a day (BID) | ORAL | Status: DC
  Administered 2020-04-26 – 2020-04-28 (×4): 12.5 mg via ORAL
  Filled 2020-04-26 (×4): qty 1

## 2020-04-26 MED ORDER — SENNOSIDES-DOCUSATE SODIUM 8.6-50 MG PO TABS
1.0000 | ORAL_TABLET | Freq: Every day | ORAL | Status: DC
  Administered 2020-04-26 – 2020-04-29 (×4): 1 via ORAL
  Filled 2020-04-26 (×4): qty 1

## 2020-04-26 MED ORDER — SORBITOL 70 % SOLN
15.0000 mL | Freq: Every day | Status: DC | PRN
  Administered 2020-04-26: 15 mL via ORAL
  Filled 2020-04-26: qty 30

## 2020-04-26 MED ORDER — PROSOURCE PLUS PO LIQD
30.0000 mL | Freq: Two times a day (BID) | ORAL | Status: DC
  Administered 2020-04-26 – 2020-05-12 (×29): 30 mL via ORAL
  Filled 2020-04-26 (×25): qty 30

## 2020-04-26 NOTE — Progress Notes (Signed)
Physical Therapy Session Note  Patient Details  Name: Tony Schmitt MRN: [id number] Date of Birth: 11-Mar-1971  Today's Date: 04/26/2020 PT Individual Time: 1000-1100 PT Individual Time Calculation (min): 60 min   Short Term Goals: Week 1:  PT Short Term Goal 1 (Week 1): Pt will perform sit<>stands with min assist PT Short Term Goal 2 (Week 1): Pt will perform bed<>chair transfers with min assist PT Short Term Goal 3 (Week 1): Pt will ambulate at least 72ft using LRAD with mod assist PT Short Term Goal 4 (Week 1): Pt will ascend/descend 4 steps using HRs with mod assist  Skilled Therapeutic Interventions/Progress Updates:    Pt received sitting in w/c, agreeable to PT session. He reports he urinated on himself while trying to void in urinal; pants and shirt saturated. Doffed pants/underwear with modA while seated in w/c. Provided soaped washcloth and he was able to perform pericare with setupA while seated. Donned clean pants/underwear with modA. Stand<>pivot transfer from w/c to EOB with modA and no AD towards weaker L side. Doffed shirt with supervision and provided another soaped washcloth for upper body bathing which he completed with setupA while seated EOB. Required minA for donning new shirt and threading LUE. Therapist cleaned w/c cushion/sling. Performed stand<>pivot with minA from EOB to w/c and WC transport to hallway outside main therapy gym. Therapist setup LUE RW splint and then pt ambulated 58ft + 69ft (seated rest) with modA and RW + w/c follow for safety. Therapist assisting with LLE advancement, RW management, and L foot placement. WC transport back to his room where he remained seated in w/c, belt alarm on, needs in reach.  Therapy Documentation Precautions:  Precautions Precautions: Fall, Other (comment) Precaution Comments: left hemiparesis Restrictions Weight Bearing Restrictions: No  Therapy/Group: Individual Therapy  Jevonte Clanton P Joffrey Kerce PT 04/26/2020, 7:51 AM

## 2020-04-26 NOTE — Plan of Care (Signed)
  Problem: Consults Goal: RH STROKE PATIENT EDUCATION Description: See Patient Education module for education specifics  Outcome: Progressing   Problem: RH BOWEL ELIMINATION Goal: RH STG MANAGE BOWEL WITH ASSISTANCE Description: STG Manage Bowel with supervision/min Assistance. Outcome: Progressing Goal: RH STG MANAGE BOWEL W/MEDICATION W/ASSISTANCE Description: STG Manage Bowel with Medication with supervision/min Assistance. Outcome: Progressing   Problem: RH BLADDER ELIMINATION Goal: RH STG MANAGE BLADDER WITH ASSISTANCE Description: STG Manage Bladder With supervision/min Assistance Outcome: Progressing   Problem: RH SKIN INTEGRITY Goal: RH STG MAINTAIN SKIN INTEGRITY WITH ASSISTANCE Description: STG Maintain Skin Integrity With supervision/min Assistance. Outcome: Progressing   Problem: RH SAFETY Goal: RH STG ADHERE TO SAFETY PRECAUTIONS W/ASSISTANCE/DEVICE Description: STG Adhere to Safety Precautions With supervision/min Assistance and appropriate assistive Device. Outcome: Progressing   Problem: RH PAIN MANAGEMENT Goal: RH STG PAIN MANAGED AT OR BELOW PT'S PAIN GOAL Description: <3 on a 0-10 pain scale. Outcome: Progressing   Problem: RH KNOWLEDGE DEFICIT Goal: RH STG INCREASE KNOWLEDGE OF HYPERTENSION Description: Patient will demonstrate knowledge of HTN medications, dietary restrictions, BP parameters, and follow up care with the MD with supervision/min assist from CIR staff at discharge. Outcome: Progressing Goal: RH STG INCREASE KNOWLEGDE OF HYPERLIPIDEMIA Description: Patient will demonstrate knowledge of HLD medications, and follow up care with the MD with supervision/min from CIR staff at discharge. Outcome: Progressing Goal: RH STG INCREASE KNOWLEDGE OF STROKE PROPHYLAXIS Description: Patient will demonstrate knowledge of secondary medications used to prevent future strokes with supervision/min from CIR staff at discharge. Outcome: Progressing   Problem:  RH KNOWLEDGE DEFICIT Goal: RH STG INCREASE KNOWLEDGE OF DIABETES Description: Patient will demonstrate knowledge of diabetes medications, dietary restrictions, blood sugar parameters, and follow up are with the MD with supervision/min from CIR staff at discharge. Outcome: Progressing

## 2020-04-26 NOTE — Progress Notes (Signed)
Orthopedic Tech Progress Note Patient Details:  Paras Kreider Dambrosio 1971-03-30 [id number] Called [facility] for rehab combo. Patient ID: Kinney Sackmann [place], male   DOB: [birth year], 49 y.o.   MRN: [id number]   Petra Kuba 04/26/2020, 4:35 PM

## 2020-04-26 NOTE — Progress Notes (Addendum)
[place] PHYSICAL MEDICINE & REHABILITATION PROGRESS NOTE   Subjective/Complaints: Patient seen sitting up working with therapy this morning.  He states he did not sleep well overnight because his room was cold.  Discussed with therapies, who states temperature was adjusted.  He notes therapies are going well.  ROS: Denies CP, SOB, N/V/D  Objective:   No results found. Recent Labs    04/25/20 0717  WBC 11.2*  HGB 8.1*  HCT 25.9*  PLT 306   Recent Labs    04/25/20 1407 04/26/20 1258  NA 137 134*  K 4.1 4.2  CL 103 100  CO2 26 23  GLUCOSE 183* 113*  BUN 31* 33*  CREATININE 3.24* 3.07*  CALCIUM 9.3 9.6    Intake/Output Summary (Last 24 hours) at 04/26/2020 1420 Last data filed at 04/26/2020 0756 Gross per 24 hour  Intake 240 ml  Output 800 ml  Net -560 ml        Physical Exam: Vital Signs Blood pressure (!) 167/89, pulse 94, temperature 98.1 F (36.7 C), resp. rate 17, height 5\' 4"  (1.626 m), weight 77.5 kg, SpO2 100 %. Constitutional: No distress . Vital signs reviewed. HENT: Normocephalic.  Atraumatic. Eyes: EOMI. No discharge. Cardiovascular: No JVD.  RRR. Respiratory: Normal effort.  No stridor.  Bilateral clear to auscultation. GI: Non-distended.  BS +. Skin: Warm and dry.  Intact. Psych: Normal mood.  Normal behavior. Musc: No edema in extremities.  No tenderness in extremities. Neuro: Alert Motor: RUE/RLE: 5/5 proximal distal LUE: Shoulder abduction 3 -/5, elbow flexion 3 -/5, hand grip 0/5 Left lower extremity: Hip flexion 2+/5, knee extension 2+/5, ankle dorsiflexion 0/5  Assessment/Plan: 1. Functional deficits secondary to RIght corona radiata infarct which require 3+ hours per day of interdisciplinary therapy in a comprehensive inpatient rehab setting.  Physiatrist is providing close team supervision and 24 hour management of active medical problems listed below.  Physiatrist and rehab team continue to assess barriers to discharge/monitor  patient progress toward functional and medical goals  Care Tool:  Bathing  Bathing activity did not occur:  (N/A) Body parts bathed by patient: Chest, Abdomen, Left arm, Front perineal area, Right upper leg, Left upper leg, Face, Buttocks, Left lower leg, Right lower leg   Body parts bathed by helper: Right arm     Bathing assist Assist Level: Minimal Assistance - Patient > 75%     Upper Body Dressing/Undressing Upper body dressing   What is the patient wearing?: Pull over shirt    Upper body assist Assist Level: Moderate Assistance - Patient 50 - 74%    Lower Body Dressing/Undressing Lower body dressing      What is the patient wearing?: Underwear/pull up, Pants     Lower body assist Assist for lower body dressing: Minimal Assistance - Patient > 75%     Toileting Toileting    Toileting assist Assist for toileting: Set up assist Assistive Device Comment:  (Urinal)   Transfers Chair/bed transfer  Transfers assist     Chair/bed transfer assist level: Moderate Assistance - Patient 50 - 74%     Locomotion Ambulation   Ambulation assist      Assist level: 2 helpers (modA and w/c follow) Assistive device: Walker-rolling Max distance: 49ft   Walk 10 feet activity   Assist     Assist level: 2 helpers Assistive device: Walker-rolling   Walk 50 feet activity   Assist Walk 50 feet with 2 turns activity did not occur: Safety/medical concerns  Walk 150 feet activity   Assist Walk 150 feet activity did not occur: Safety/medical concerns         Walk 10 feet on uneven surface  activity   Assist Walk 10 feet on uneven surfaces activity did not occur: Safety/medical concerns         Wheelchair     Assist Will patient use wheelchair at discharge?: No (Per PT goals) Type of Wheelchair: Manual    Wheelchair assist level: Minimal Assistance - Patient > 75%, Set up assist Max wheelchair distance: 132ft    Wheelchair 50 feet  with 2 turns activity    Assist        Assist Level: Minimal Assistance - Patient > 75%, Set up assist   Wheelchair 150 feet activity     Assist      Assist Level: Minimal Assistance - Patient > 75%, Set up assist   Blood pressure (!) 167/89, pulse 94, temperature 98.1 F (36.7 C), resp. rate 17, height 5\' 4"  (1.626 m), weight 77.5 kg, SpO2 100 %.    Medical Problem List and Plan: 1.  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   Continue CIR  WHO/PRAFO ordered 2.  Antithrombotics: -DVT/anticoagulation: Lovenox             -antiplatelet therapy: Aspirin 81 mg daily 3. Pain Management: Tylenol as needed.  4. Mood: Provide emotional support             -antipsychotic agents: N/A 5. Neuropsych: This patient is capable of making decisions on his own behalf. 6. Skin/Wound Care: Routine skin checks 7. Fluids/Electrolytes/Nutrition: Routine in and outs.   8.  Hypertension.    Lisinopril PTA, on hold due to AKI  Norvasc 10 mg daily  Lopressor 12.5 mg twice daily started on 9/21          Vitals:   04/26/20 0634 04/26/20 1309  BP: (!) 162/79 (!) 167/89  Pulse: 93 94  Resp: 18 17  Temp: 98.5 F (36.9 C) 98.1 F (36.7 C)  SpO2: 100% 100%   9.  Diabetes mellitus with hyperglycemia.  Hemoglobin A1c 9.6.  Check blood sugars before meals and at bedtime.  Diabetic teaching             CBG (last 3)  Recent Labs    04/25/20 2138 04/26/20 0638 04/26/20 1135  GLUCAP 149* 96 112*   Continue lantus to 6U qhs, will transition to oral agent in future  Controlled on 9/21 10.  CKD stage III.  Creatinine baseline 2.42 08/02/2018.  Renal ultrasound 04/17/2019 showed no hydronephrosis or shadowing stone.  Started on sodium bicarbonate 1300 mg twice daily 04/17/2020.               Creatinine 3.07 on 9/21  Encourage fluids 11.  Tobacco abuse.  Counsel 12.  Hyperlipidemia:  Lipitor 13.  Slow transit constipation  Bowel meds increased again on 9/21 14. Leukocytosis:   WBCs 11.2 on 9/20  Afebrile  Continue to monitor 15.  Acute blood loss anemia?  On chronic anemia  Hemoglobin 8.1 on 9/20, continue to monitor 16.  Hypoalbuminemia  Supplement initiated on 9/21  LOS: 4 days A FACE TO FACE EVALUATION WAS PERFORMED  Taquilla Downum [name] 04/26/2020, 2:20 PM

## 2020-04-26 NOTE — Progress Notes (Addendum)
Occupational Therapy Session Note  Patient Details  Name: Tony Schmitt MRN: [id number] Date of Birth: 08/30/1970  Today's Date: 04/26/2020 OT Individual Time: 4128-7867 OT Individual Time Calculation (min): 60 min    Short Term Goals: Week 1:  OT Short Term Goal 1 (Week 1): Pt will transfer to toilet with min A sq pivot. OT Short Term Goal 2 (Week 1): Pt will sit to stand with CGA during toileting. OT Short Term Goal 3 (Week 1): Pt will be able to manage clothing over hips with CGA. OT Short Term Goal 4 (Week 1): Pt will complete self ROM for LUE to prevent edema independently. OT Short Term Goal 5 (Week 1): Pt will be able to don a shirt with min A using some active L shoulder movement.  Skilled Therapeutic Interventions/Progress Updates:    Pt sitting up in w/c, no c/o pain, agreeable to OT session.  Pt transported to bathroom for bathing at shower level. Min assist needed for SPT w/c to TTB using grab bar.  Waterproof covering applied to right FA IV site.Pt completed UB/LB bathing with min assist for RUE and feet and needing CGA during sit<>stand to bathe buttocks with use of grab bar. SPT TTB to w/c with min assist using grab bar.  Pt transported to sink and independently applied lotion. Pt completed UB dressing with min assist to thread shirt over LUE. Pt needing some assist to thread left foot into brief and pant leg and min assist to pull over left hip.  Pt reporting increased fatigue and request to return to bed.  SPT with min assist w/c to EOB.  Min assist to support LLE sit to supine.  Call bell in reach, bed alarm on. Pt did a good job today remembering compensatory hemi techniques from yesterday's OT treatment session.   15 minutes missed treatment time due to pt fatigue.   Therapy Documentation Precautions:  Precautions Precautions: Fall, Other (comment) Precaution Comments: left hemiparesis Restrictions Weight Bearing Restrictions: No   Therapy/Group: Individual  Therapy  [name] 04/26/2020, 9:29 AM

## 2020-04-26 NOTE — Progress Notes (Signed)
Occupational Therapy Session Note  Patient Details  Name: Tony Schmitt [place] MRN: [id number] Date of Birth: 1971/04/18  Today's Date: 04/26/2020 OT Individual Time: 7262-0355 OT Individual Time Calculation (min): 65 min    Short Term Goals: Week 1:  OT Short Term Goal 1 (Week 1): Pt will transfer to toilet with min A sq pivot. OT Short Term Goal 2 (Week 1): Pt will sit to stand with CGA during toileting. OT Short Term Goal 3 (Week 1): Pt will be able to manage clothing over hips with CGA. OT Short Term Goal 4 (Week 1): Pt will complete self ROM for LUE to prevent edema independently. OT Short Term Goal 5 (Week 1): Pt will be able to don a shirt with min A using some active L shoulder movement.  Skilled Therapeutic Interventions/Progress Updates:    Patient seated in w.c, alert and ready for therapy session.  He denies pain and declines shower offered.  Able to donn pants with mod A, CGA for CM in stance.  Max A for teds, socks and shoes.  Able to use bilateral LEs to propel w.c 20 feet.  SPT to/from w/c and mat table with min A.   Unsupported sitting with focus on trunk mobility, posture, inhibition of proximal flexors/stretching.  Completed NMRE activities with focus on left UE control in seated and supine positions.   Able to facilitate shoulder and full elbow in supine, he was able to carryover with repetition and weight bearing in seated positions, trace sup/pron.  He remained seated in w/c at close of session, seat belt alarm set and call bell in reach.    Therapy Documentation Precautions:  Precautions Precautions: Fall, Other (comment) Precaution Comments: left hemiparesis Restrictions Weight Bearing Restrictions: No   Therapy/Group: Individual Therapy  Carlos Levering 04/26/2020, 7:31 AM

## 2020-04-26 NOTE — Consult Note (Signed)
Reason for Consult: AKI/CKD stage 3b Referring Physician: Posey Pronto, MD  Tony Schmitt is an 49 y.o. male has a PMH significant for poorly controlled DM, HTN, and CKD stage IIIb who presented to [facility] ED from home with left-sided weakness.  In the ED, his SBP was >200.  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  He was admitted and evaluated by Neurology and eventually discharged on 04/22/20 to CIR for his left hemiparesis and need for rehab.  We were consulted due to the development of AKI/CKD stage IIIb.  The trend in Scr is seen below.  He has known of his CKD but has never seen Nephrology before.  He denies any N/V/D, dysuria, pyuria, hematuria, urgency, frequency, or retention.  He also denies any family history of CKD.   Trend in Creatinine: Creatinine, Ser  Date/Time Value Ref Range Status  04/26/2020 12:58 PM 3.07 (H) 0.61 - 1.24 mg/dL Final  04/25/2020 02:07 PM 3.24 (H) 0.61 - 1.24 mg/dL Final  04/22/2020 03:53 PM 3.21 (H) 0.61 - 1.24 mg/dL Final  04/22/2020 01:36 AM 3.10 (H) 0.61 - 1.24 mg/dL Final  04/21/2020 09:50 AM 2.92 (H) 0.61 - 1.24 mg/dL Final  04/19/2020 08:43 AM 2.93 (H) 0.61 - 1.24 mg/dL Final  04/18/2020 09:10 AM 3.06 (H) 0.61 - 1.24 mg/dL Final  04/17/2020 02:50 PM 3.01 (H) 0.61 - 1.24 mg/dL Final  04/17/2020 08:24 AM 3.08 (H) 0.61 - 1.24 mg/dL Final  04/16/2020 05:58 AM 2.70 (H) 0.61 - 1.24 mg/dL Final  04/16/2020 12:45 AM 2.92 (H) 0.61 - 1.24 mg/dL Final  04/16/2020 12:44 AM 3.50 (H) 0.61 - 1.24 mg/dL Final  08/19/2019 03:52 PM 1.84 (H) 0.76 - 1.27 mg/dL Final  05/21/2019 03:21 PM 1.71 (H) 0.76 - 1.27 mg/dL Final  04/23/2019 12:05 PM 2.13 (H) 0.76 - 1.27 mg/dL Final   04/19/2019 04:57 AM 1.62 (H) 0.61 - 1.24 mg/dL Final  04/18/2019 04:13 AM 3.08 (H) 0.61 - 1.24 mg/dL Final  04/17/2019 04:43 PM 3.72 (H) 0.61 - 1.24 mg/dL Final  04/16/2019 02:33 PM 3.35 (HH) 0.76 - 1.27 mg/dL Final  08/29/2018 11:45 AM 1.23 0.76 - 1.27 mg/dL Final  08/02/2018 06:39 PM 2.42 (H) 0.61 - 1.24 mg/dL Final  03/26/2018 11:04 AM 1.94 (H) 0.76 - 1.27 mg/dL Final    PMH:   Past Medical History:  Diagnosis Date  . Diabetes mellitus without complication ([place])   . Hypertension     PSH:   Past Surgical History:  Procedure Laterality Date  . BUBBLE STUDY  04/18/2020   Procedure: BUBBLE STUDY;  Surgeon: Sueanne Margarita, MD;  Location: [place];  Service: Cardiovascular;;  . TEE WITHOUT CARDIOVERSION N/A 04/18/2020   Procedure: TRANSESOPHAGEAL ECHOCARDIOGRAM (TEE);  Surgeon: Sueanne Margarita, MD;  Location: [facility] ENDOSCOPY;  Service: Cardiovascular;  Laterality: N/A;    Allergies: No Known Allergies  Medications:   Prior to Admission medications   Medication Sig Start Date End Date Taking? Authorizing Provider  amLODipine (NORVASC) 5 MG tablet Take 1 tablet (5 mg total) by mouth daily. 04/20/20   Regalado, Jerald Kief A, MD  aspirin 81 MG chewable tablet Chew 1 tablet (81 mg total) by mouth daily. 04/20/20   Regalado, Belkys A, MD  atorvastatin (LIPITOR) 80 MG  tablet Take 1 tablet (80 mg total) by mouth at bedtime. 04/19/20   Regalado, Belkys A, MD  glipiZIDE (GLUCOTROL) 5 MG tablet TAKE 1 TABLET BY MOUTH TWICE DAILY BEFORE A MEAL 02/23/20   Sagardia, Ines Bloomer, MD  insulin glargine (LANTUS) 100 UNIT/ML injection Inject 0.1 mLs (10 Units total) into the skin daily. 04/20/20   Regalado, Belkys A, MD  omeprazole (PRILOSEC) 40 MG capsule Take 1 capsule (40 mg total) by mouth daily. 04/16/19   Horald Pollen, MD  ondansetron (ZOFRAN ODT) 4 MG disintegrating tablet Take 1 tablet (4 mg total) by mouth every 8 (eight) hours as needed for nausea or vomiting. Patient not taking: Reported  on 08/19/2019 04/19/19   Rai, Vernelle Emerald, MD  rosuvastatin (CRESTOR) 20 MG tablet Take 1 tablet (20 mg total) by mouth daily. 04/19/19   Rai, Ripudeep K, MD  sodium bicarbonate 650 MG tablet Take 2 tablets (1,300 mg total) by mouth 2 (two) times daily. 04/19/20   Regalado, Cassie Freer, MD    Inpatient medications: . (feeding supplement) PROSource Plus  30 mL Oral BID BM  . amLODipine  10 mg Oral Daily  . aspirin  81 mg Oral Daily  . atorvastatin  80 mg Oral QHS  . blood pressure control book   Does not apply Once  . enoxaparin (LOVENOX) injection  30 mg Subcutaneous Q24H  . insulin aspart  0-9 Units Subcutaneous TID WC  . insulin glargine  6 Units Subcutaneous Daily  . living well with diabetes book   Does not apply Once  . metoprolol tartrate  12.5 mg Oral BID  . pantoprazole  40 mg Oral Daily  . polyethylene glycol  17 g Oral BID  . senna-docusate  1 tablet Oral QHS  . sodium bicarbonate  1,300 mg Oral BID    Discontinued Meds:   Medications Discontinued During This Encounter  Medication Reason  . enoxaparin (LOVENOX) injection 30 mg Duplicate  . insulin glargine (LANTUS) injection 10 Units   . polyethylene glycol (MIRALAX / GLYCOLAX) packet 17 g     Social History:  reports that he has quit smoking. His smoking use included cigarettes and cigars. He has never used smokeless tobacco. He reports previous alcohol use. He reports that he does not use drugs.  Family History:   Family History  Problem Relation Age of Onset  . Diabetes Mother   . Diabetes Father   . Hypertension Father   . Cancer Father   . Heart failure Other     Pertinent items are noted in HPI. Weight change:   Intake/Output Summary (Last 24 hours) at 04/26/2020 1701 Last data filed at 04/26/2020 1400 Gross per 24 hour  Intake 360 ml  Output 1100 ml  Net -740 ml   BP (!) 167/89 (BP Location: Left Arm)   Pulse 94   Temp 98.1 F (36.7 C)   Resp 17   Ht 5\' 4"  (1.626 m)   Wt 77.5 kg   SpO2 100%   BMI  29.33 kg/m  Vitals:   04/25/20 1302 04/25/20 1954 04/26/20 0634 04/26/20 1309  BP: (!) 153/85 (!) 156/75 (!) 162/79 (!) 167/89  Pulse: 86 91 93 94  Resp: 17 18 18 17   Temp: 97.8 F (36.6 C) 98.4 F (36.9 C) 98.5 F (36.9 C) 98.1 F (36.7 C)  TempSrc:      SpO2: 100% 99% 100% 100%  Weight:      Height:  General appearance: alert, cooperative, no distress and slowed mentation Head: Normocephalic, without obvious abnormality, atraumatic Resp: clear to auscultation bilaterally Cardio: regular rate and rhythm, S1, S2 normal, no murmur, click, rub or gallop GI: soft, non-tender; bowel sounds normal; no masses,  no organomegaly Extremities: extremities normal, atraumatic, no cyanosis or edema  Labs: Basic Metabolic Panel: Recent Labs  Lab 04/21/20 0950 04/22/20 0136 04/22/20 1553 04/25/20 1407 04/26/20 1258  NA 137 138  --  137 134*  K 3.7 3.7  --  4.1 4.2  CL 109 109  --  103 100  CO2 21* 23  --  26 23  GLUCOSE 210* 156*  --  183* 113*  BUN 30* 32*  --  31* 33*  CREATININE 2.92* 3.10* 3.21* 3.24* 3.07*  ALBUMIN  --   --   --  1.5* 1.7*  CALCIUM 8.7* 8.9  --  9.3 9.6  PHOS  --   --   --   --  3.1   Liver Function Tests: Recent Labs  Lab 04/25/20 1407 04/26/20 1258  AST 30  --   ALT 34  --   ALKPHOS 106  --   BILITOT 0.7  --   PROT 5.2*  --   ALBUMIN 1.5* 1.7*   No results for input(s): LIPASE, AMYLASE in the last 168 hours. No results for input(s): AMMONIA in the last 168 hours. CBC: Recent Labs  Lab 04/21/20 0950 04/22/20 0136 04/22/20 1553 04/25/20 0717  WBC 9.2 10.3 9.7 11.2*  NEUTROABS  --   --   --  7.6  HGB 9.6* 9.3* 9.6* 8.1*  HCT 30.0* 29.1* 30.7* 25.9*  MCV 84.3 84.3 84.6 86.3  PLT 386 393 394 306   PT/INR: @LABRCNTIP (inr:5) Cardiac Enzymes: )No results for input(s): CKTOTAL, CKMB, CKMBINDEX, TROPONINI in the last 168 hours. CBG: Recent Labs  Lab 04/25/20 1633 04/25/20 2138 04/26/20 0638 04/26/20 1135 04/26/20 1625  GLUCAP  177* 149* 96 112* 182*    Iron Studies: No results for input(s): IRON, TIBC, TRANSFERRIN, FERRITIN in the last 168 hours.  Xrays/Other Studies: No results found.   Assessment/Plan: 1.  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  Underlying CKD likely due to combination of poorly controlled DM and HTN.  He was on lisinopril prior to admission, however he presented with AKI and lisinopril was held.  His Scr peaked at 3.5 on 04/16/20 but improved to nadir of 2.7 only to rise to 3-3.2 over the past few days.   1. Continue to hold ACE 2. 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 3. Will check renal US and order some urine studies. 2. 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  On lovenox and was to have loop recorder once he gets his insurance established. 3. HTN- not at goal.  Will likely need to increase metoprolol to 25 mg bid if ok with neuro and PMR 4. DM- per primary 5. Anemia of CKD- will check iron stores but will likely require initiation of ESA. 6. SHTPH- will check iPTH and vit D levels, as well as phosphorus   Tony Schmitt 04/26/2020, 5:01 PM

## 2020-04-27 ENCOUNTER — Inpatient Hospital Stay (HOSPITAL_COMMUNITY): Payer: Self-pay | Admitting: Occupational Therapy

## 2020-04-27 ENCOUNTER — Inpatient Hospital Stay (HOSPITAL_COMMUNITY): Payer: Self-pay

## 2020-04-27 LAB — CBC
HCT: 31.9 % — ABNORMAL LOW (ref 39.0–52.0)
Hemoglobin: 10 g/dL — ABNORMAL LOW (ref 13.0–17.0)
MCH: 26.4 pg (ref 26.0–34.0)
MCHC: 31.3 g/dL (ref 30.0–36.0)
MCV: 84.2 fL (ref 80.0–100.0)
Platelets: 455 10*3/uL — ABNORMAL HIGH (ref 150–400)
RBC: 3.79 MIL/uL — ABNORMAL LOW (ref 4.22–5.81)
RDW: 13.6 % (ref 11.5–15.5)
WBC: 11.4 10*3/uL — ABNORMAL HIGH (ref 4.0–10.5)
nRBC: 0 % (ref 0.0–0.2)

## 2020-04-27 LAB — GLUCOSE, CAPILLARY
Glucose-Capillary: 129 mg/dL — ABNORMAL HIGH (ref 70–99)
Glucose-Capillary: 141 mg/dL — ABNORMAL HIGH (ref 70–99)
Glucose-Capillary: 160 mg/dL — ABNORMAL HIGH (ref 70–99)
Glucose-Capillary: 214 mg/dL — ABNORMAL HIGH (ref 70–99)

## 2020-04-27 LAB — VITAMIN D 25 HYDROXY (VIT D DEFICIENCY, FRACTURES): Vit D, 25-Hydroxy: 5.39 ng/mL — ABNORMAL LOW (ref 30–100)

## 2020-04-27 LAB — RENAL FUNCTION PANEL
Albumin: 1.5 g/dL — ABNORMAL LOW (ref 3.5–5.0)
Anion gap: 8 (ref 5–15)
BUN: 33 mg/dL — ABNORMAL HIGH (ref 6–20)
CO2: 27 mmol/L (ref 22–32)
Calcium: 9.6 mg/dL (ref 8.9–10.3)
Chloride: 101 mmol/L (ref 98–111)
Creatinine, Ser: 3.19 mg/dL — ABNORMAL HIGH (ref 0.61–1.24)
GFR calc Af Amer: 25 mL/min — ABNORMAL LOW (ref >60)
GFR calc non Af Amer: 21 mL/min — ABNORMAL LOW (ref >60)
Glucose, Bld: 121 mg/dL — ABNORMAL HIGH (ref 70–99)
Phosphorus: 3.2 mg/dL (ref 2.5–4.6)
Potassium: 3.7 mmol/L (ref 3.5–5.1)
Sodium: 136 mmol/L (ref 135–145)

## 2020-04-27 LAB — FERRITIN: Ferritin: 635 ng/mL — ABNORMAL HIGH (ref 24–336)

## 2020-04-27 LAB — IRON AND TIBC
Iron: 20 ug/dL — ABNORMAL LOW (ref 45–182)
Saturation Ratios: 14 % — ABNORMAL LOW (ref 17.9–39.5)
TIBC: 141 ug/dL — ABNORMAL LOW (ref 250–450)
UIBC: 121 ug/dL

## 2020-04-27 LAB — VITAMIN B12: Vitamin B-12: 448 pg/mL (ref 180–914)

## 2020-04-27 MED ORDER — SODIUM CHLORIDE 0.9 % IV SOLN
510.0000 mg | INTRAVENOUS | Status: AC
  Administered 2020-04-27 – 2020-05-04 (×2): 510 mg via INTRAVENOUS
  Filled 2020-04-27 (×2): qty 17

## 2020-04-27 MED ORDER — SODIUM CHLORIDE 0.9 % IV SOLN: INTRAVENOUS | Status: DC

## 2020-04-27 NOTE — Progress Notes (Signed)
[place] PHYSICAL MEDICINE & REHABILITATION PROGRESS NOTE   Subjective/Complaints: Patient seen laying in bed this morning, working with therapies.  He wore his orthoses overnight.  He states he did not sleep well overnight, "just because".  Discussed urinary urgency with patient and therapies.  Discussed timed toileting.  ROS: Denies CP, SOB, N/V/D  Objective:   US RENAL  Result Date: 04/26/2020 CLINICAL DATA:  Acute kidney injury EXAM: RENAL / URINARY TRACT ULTRASOUND COMPLETE COMPARISON:  None. FINDINGS: Right Kidney: Renal measurements: 11.6 x 5 x 5.5 cm = volume: 169 mL. There is no hydronephrosis. There is a 1.6 cm cyst arising from the lower pole. Left Kidney: Renal measurements: 10.9 x 5.7 x 4.1 cm = volume: Is 134 mL. There is a 1.3 cm cyst arising from the lower pole. Bladder: Appears normal for degree of bladder distention. Other: None. IMPRESSION: Unremarkable exam.  No hydronephrosis. Electronically Signed   By: Constance Holster M.D.   On: 04/26/2020 21:27   Recent Labs    04/25/20 0717 04/27/20 0655  WBC 11.2* 11.4*  HGB 8.1* 10.0*  HCT 25.9* 31.9*  PLT 306 455*   Recent Labs    04/26/20 1258 04/27/20 0655  NA 134* 136  K 4.2 3.7  CL 100 101  CO2 23 27  GLUCOSE 113* 121*  BUN 33* 33*  CREATININE 3.07* 3.19*  CALCIUM 9.6 9.6    Intake/Output Summary (Last 24 hours) at 04/27/2020 1038 Last data filed at 04/27/2020 0745 Gross per 24 hour  Intake 440 ml  Output 700 ml  Net -260 ml        Physical Exam: Vital Signs Blood pressure (!) 147/85, pulse 86, temperature 98.7 F (37.1 C), temperature source Oral, resp. rate 16, height 5\' 4"  (1.626 m), weight 77.5 kg, SpO2 99 %. Constitutional: No distress . Vital signs reviewed. HENT: Normocephalic.  Atraumatic. Eyes: EOMI. No discharge. Cardiovascular: No JVD.  RRR. Respiratory: Normal effort.  No stridor.  Bilateral clear to auscultation. GI: Non-distended.  BS +. Skin: Warm and dry.  Intact. Psych:  Normal mood.  Normal behavior. Musc: No edema in extremities.  No tenderness in extremities. Neuro: Alert Motor: RUE/RLE: 4-4+ /5 proximal distal LUE: Shoulder abduction 3 -/5, elbow flexion 3 -/5, hand grip 0/5, unchanged Left lower extremity: Hip flexion 2+/5, knee extension 2+/5, ankle dorsiflexion 0/5, unchanged  Assessment/Plan: 1. Functional deficits secondary to RIght corona radiata infarct which require 3+ hours per day of interdisciplinary therapy in a comprehensive inpatient rehab setting.  Physiatrist is providing close team supervision and 24 hour management of active medical problems listed below.  Physiatrist and rehab team continue to assess barriers to discharge/monitor patient progress toward functional and medical goals  Care Tool:  Bathing  Bathing activity did not occur:  (N/A) Body parts bathed by patient: Chest, Abdomen, Left arm, Front perineal area, Right upper leg, Left upper leg, Face, Buttocks, Left lower leg, Right lower leg   Body parts bathed by helper: Right arm     Bathing assist Assist Level: Minimal Assistance - Patient > 75%     Upper Body Dressing/Undressing Upper body dressing   What is the patient wearing?: Pull over shirt    Upper body assist Assist Level: Minimal Assistance - Patient > 75%    Lower Body Dressing/Undressing Lower body dressing      What is the patient wearing?: Pants, Incontinence brief     Lower body assist Assist for lower body dressing: Minimal Assistance - Patient > 75%  Toileting Toileting    Toileting assist Assist for toileting: Set up assist Assistive Device Comment:  (Urinal)   Transfers Chair/bed transfer  Transfers assist     Chair/bed transfer assist level: Moderate Assistance - Patient 50 - 74%     Locomotion Ambulation   Ambulation assist      Assist level: 2 helpers (modA and w/c follow) Assistive device: Walker-rolling Max distance: 68ft   Walk 10 feet activity   Assist      Assist level: 2 helpers Assistive device: Walker-rolling   Walk 50 feet activity   Assist Walk 50 feet with 2 turns activity did not occur: Safety/medical concerns         Walk 150 feet activity   Assist Walk 150 feet activity did not occur: Safety/medical concerns         Walk 10 feet on uneven surface  activity   Assist Walk 10 feet on uneven surfaces activity did not occur: Safety/medical concerns         Wheelchair     Assist Will patient use wheelchair at discharge?: No (Per PT goals) Type of Wheelchair: Manual    Wheelchair assist level: Minimal Assistance - Patient > 75%, Set up assist Max wheelchair distance: 168ft    Wheelchair 50 feet with 2 turns activity    Assist        Assist Level: Minimal Assistance - Patient > 75%, Set up assist   Wheelchair 150 feet activity     Assist      Assist Level: Minimal Assistance - Patient > 75%, Set up assist   Blood pressure (!) 147/85, pulse 86, temperature 98.7 F (37.1 C), temperature source Oral, resp. rate 16, height 5\' 4"  (1.626 m), weight 77.5 kg, SpO2 99 %.    Medical Problem List and Plan: 1.  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   Continue CIR  WHO/PRAFO nightly  Team conference today to discuss current and goals and coordination of care, home and environmental barriers, and discharge planning with nursing, case manager, and therapies. Please see conference note from today as well.  2.  Antithrombotics: -DVT/anticoagulation: Lovenox             -antiplatelet therapy: Aspirin 81 mg daily 3. Pain Management: Tylenol as needed.  4. Mood: Provide emotional support             -antipsychotic agents: N/A 5. Neuropsych: This patient is capable of making decisions on his own behalf. 6. Skin/Wound Care: Routine skin checks 7. Fluids/Electrolytes/Nutrition: Routine in and  outs.   8.  Hypertension.    Lisinopril PTA, on hold due to AKI  Norvasc 10 mg daily  Lopressor 12.5 mg twice daily started on 9/21          Vitals:   04/26/20 2059 04/27/20 0527  BP: (!) 172/89 (!) 147/85  Pulse: 90 86  Resp: 17 16  Temp: 98.5 F (36.9 C) 98.7 F (37.1 C)  SpO2: 99% 99%   Elevated, but?  Improving on 9/22, will consider further medication adjustments if necessary 9.  Diabetes mellitus with hyperglycemia.  Hemoglobin A1c 9.6.  Check blood sugars before meals and at bedtime.  Diabetic teaching             CBG (last 3)  Recent Labs    04/26/20 1625 04/26/20 2100 04/27/20 0611  GLUCAP 182* 140* 129*   Continue lantus to 6U qhs, will  transition to oral agent in future  Mildly elevated on 9/22 10.  AKI on CKD stage III.  Creatinine baseline 2.42 08/02/2018.  Renal ultrasound 04/17/2019 showed no hydronephrosis or shadowing stone.  Started on sodium bicarbonate 1300 mg twice daily 04/17/2020.               Creatinine 3.19 on 9/22  EF reviewed-60-65%  IVF nightly x3 nights ordered on 9/22  Encourage fluids 11.  Tobacco abuse.  Counsel 12.  Hyperlipidemia: Lipitor 13.  Slow transit constipation  Bowel meds increased again on 9/21 14. Leukocytosis:   WBCs 11.4 on 9/22    Afebrile  Continue to monitor 15.  Acute blood loss anemia?  On chronic anemia  Hemoglobin 10.0 on 9/22 16.  Severe hypoalbuminemia  Supplement initiated on 9/21  LOS: 5 days A FACE TO FACE EVALUATION WAS PERFORMED  Jimmye Wisnieski [name] 04/27/2020, 10:38 AM

## 2020-04-27 NOTE — Patient Care Conference (Signed)
Inpatient RehabilitationTeam Conference and Plan of Care Update Date: 04/27/2020   Time: 11:21 AM    Patient Name: Tony Schmitt      Medical Record Number: [id number]  Date of Birth: [birth year] Sex: Male         Room/Bed: 4W26C/4W26C-01 Payor Info: Payor: /    Admit Date/Time:  04/22/2020  3:08 PM  Primary Diagnosis:  CVA (cerebral vascular accident) [facility])  Hospital Problems: Principal Problem:   CVA (cerebral vascular accident) ([place]) Active Problems:   Hypoalbuminemia due to protein-calorie malnutrition ([place])   Acute blood loss anemia   Leukocytosis   Slow transit constipation   AKI (acute kidney injury) ([place])   Diabetes mellitus type 2 with complications, uncontrolled [facility])    Expected Discharge Date: Expected Discharge Date: 05/12/20  Team Members Present: Physician leading conference: Dr. Delice Lesch Care Coodinator Present: Dorien Chihuahua, RN, BSN, CRRN;Other (comment) Jacqlyn Larsen [place], SW) Nurse Present: Rayne Du, LPN PT Present: Other (comment) (Christain Manhard, PT) OT Present: Leretha Pol, OT PPS Coordinator present : Ileana Ladd, Burna Mortimer, SLP     Current Status/Progress Goal Weekly Team Focus  Bowel/Bladder   continent of B&B; LBM 9/21  remain continent of B&B  assess q shift/prn   Swallow/Nutrition/ Hydration             ADL's   min assist overall  supervision  functional transfer training using TTB, w/c, EOB; LUE NMR; compensatory training during self care   Mobility   min/modA transfers, modA gait 72ft with RW  supervision/CGA  LLE NMR, transfers, gait progressions, standing balance   Communication             Safety/Cognition/ Behavioral Observations            Pain   no c/o pain  remain pain free  assess q shift/prn   Skin   skin intact  remain free of breakdown/infection  assess q shift/prn     Discharge Planning:  HOme with Mom who can assist with light min and supervision. Pt doing well and making progress in therapies    Team Discussion: Elevated kidney function levels = IVF ordered, MD notes WBC trending up; monitoring that and anemia.  Patient on target to meet rehab goals: yes, very motivated to meet goals set  *See Care Plan and progress notes for long and short-term goals.   Revisions to Treatment Plan:   Teaching Needs: Compensatory strategies for left shoulder enervation.  Current Barriers to Discharge: Decreased caregiver support  Possible Resolutions to Barriers: Education with mother    Medical Summary Current Status: Left side hemiparesis secondary to acute/subacute nonhemorrhagic infarct posterior right corona radiata and anterior aspect of left external capsule.  Barriers to Discharge: Medical stability;Other (comments);Incontinence   Possible Resolutions to [employer] Focus: Therapies, optimize BP/DM meds, follow labs, encourage fluids   Continued Need for Acute Rehabilitation Level of Care: The patient requires daily medical management by a physician with specialized training in physical medicine and rehabilitation for the following reasons: Direction of a multidisciplinary physical rehabilitation program to maximize functional independence : Yes Medical management of patient stability for increased activity during participation in an intensive rehabilitation regime.: Yes Analysis of laboratory values and/or radiology reports with any subsequent need for medication adjustment and/or medical intervention. : Yes   I attest that I was present, lead the team conference, and concur with the assessment and plan of the team.   Dorien Chihuahua B 04/27/2020, 1:23 PM

## 2020-04-27 NOTE — Progress Notes (Signed)
Physical Therapy Session Note  Patient Details  Name: Tony Schmitt MRN: [id number] Date of Birth: 1970-10-05  Today's Date: 04/27/2020 PT Individual Time: 0800-0900 PT Individual Time Calculation (min): 60 min   Short Term Goals: Week 1:  PT Short Term Goal 1 (Week 1): Pt will perform sit<>stands with min assist PT Short Term Goal 2 (Week 1): Pt will perform bed<>chair transfers with min assist PT Short Term Goal 3 (Week 1): Pt will ambulate at least 88ft using LRAD with mod assist PT Short Term Goal 4 (Week 1): Pt will ascend/descend 4 steps using HRs with mod assist  Skilled Therapeutic Interventions/Progress Updates:    Pt received supine in bed, sleeping on arrival but awakens easily to voice, agreeable to PT session. Denies pain. Pt with saturated shirt, brief, and scrub pants on arrival. Pt reports he had urge to void but was unable to reach urinal in time. Encouraged him to have urinal and R side and try to plan his steps when he feels the urge, he voiced understanding. Doffed pants and brief with modA while supine, provided clean soaped washcloth for pericare which he did with his RUE. Supine<>sit with CGA with HOB elevated. Doffed shirt with supervision while seated, required modA for donning clean button up shirt and totalA for buttoning the shirt. Stand<>pivot transfer with minA and no AD from EOB to w/c. totalA for donning shoes for time management. WC transport from his room to hallway outside main therapy gym for time management. Performed gait training x32ft with modA and RW with RW hand splint + w/c follow for safety. Therapist facilitating LLE swing, foot placement, and knee block. VC throughout for lateral weight shift, RW management, upright posture, and sequencing/techniques. Performed LLE neuro-red with seated hip marches and LAQ with therapist assisting with AAROM completion and mirror for visual feedback. Emphasis on completion of these outside of therapy, he verbalized  understanding. Continued neuro re-ed with mirror and performing static standing marching with focus on LLE hip marches/knee drives to assist with carryover in gait. WC transport back to his room where he remained seated in w/c with 1/2 lap tray, belt alarm on, needs in reach.  Therapy Documentation Precautions:  Precautions Precautions: Fall, Other (comment) Precaution Comments: left hemiparesis Restrictions Weight Bearing Restrictions: No Pain: Pain Assessment Pain Scale: 0-10 Pain Score: 0-No pain   Therapy/Group: Individual Therapy  Henretter Piekarski P Nallely Yost PT 04/27/2020, 9:48 AM

## 2020-04-27 NOTE — Progress Notes (Signed)
Occupational Therapy Session Note  Patient Details  Name: Tony Schmitt MRN: [id number] Date of Birth: 09/18/1970  Today's Date: 04/27/2020 OT Individual Time: First session: 1130-1200; second session: 1345-1500 OT Individual Time Calculation (min): First session: 30 min; Second session: 75 min   Short Term Goals: Week 1:  OT Short Term Goal 1 (Week 1): Pt will transfer to toilet with min A sq pivot. OT Short Term Goal 2 (Week 1): Pt will sit to stand with CGA during toileting. OT Short Term Goal 3 (Week 1): Pt will be able to manage clothing over hips with CGA. OT Short Term Goal 4 (Week 1): Pt will complete self ROM for LUE to prevent edema independently. OT Short Term Goal 5 (Week 1): Pt will be able to don a shirt with min A using some active L shoulder movement.  Skilled Therapeutic Interventions/Progress Updates:    First Session:  Pt sitting up in w/c, no c/o pain, agreeable to OT session.  Assessed pts independence level utilizing urinal and clothing mgt during toileting in sitting and at bed level.  Pt reports soiling of clothing during urination occurs when attempting to use urinal In bed.  Pt needing VCs and education on proper placement of urinal to improve collection without spilling in both sitting and at bed level.  Pt also educated on improved body positioning semifowler in bed and placing urinal on right bed rail instead of left to increase ease of access.  Pt return demonstrated all techniques with min VCs.  Initiated Estim attended to left biceps, however unable to elicit active elbow flexion.  May need larger electrode pads.  Will try next session.  Pt currently compensating with shoulder adduction when attempting elbow flexion however noted trace intermittently in biceps.  Call bell in reach, bed alarm on.  Second Session: Pt supine in bed, agreeable to OT, no c/o pain.  Neuro re-ed to LUE completed with use of reciprocal estim attended to biceps/triceps with synchronous  effort by pt to complete elbow flexion and extension while visualizing reaching/ grasping and bringing favorite food to mouth.  OT manually supported upper arm in functional position throughout due to shoulder weakness.  Pt instructed through visualization technique to complete simple task using LUE.  Educated pt on importance of using deep breathing technique prior to completing and making sure environment is quiet without distraction.  Instructed pt to complete visualization activity x 15 minutes 2-3 times per day for LUE neur re-ed.    Pt then completed squat pivot transfer with min assist EOB to w/c in preperation for sinkside bathing and dressing.  Pt doffed shirt mod I.  Pt needing min assist to bathe UB and LB including sit to stand for peri region.  Pt donned shirt overhead needing increased time, intermittent VCs for problem solving.  Pt donned pants and brief needing min assist to thread over bilateral feet and for sit<>stand to pull over hips.  Pt returned to bed per request squat pivot transfer needing min assist.  Supervision sit to supine.  Call bell in reach, bed alarm on.    Therapy Documentation Precautions:  Precautions Precautions: Fall, Other (comment) Precaution Comments: left hemiparesis Restrictions Weight Bearing Restrictions: No   Therapy/Group: Individual Therapy  [name] 04/27/2020, 12:21 PM

## 2020-04-27 NOTE — Progress Notes (Signed)
Occupational Therapy Session Note  Patient Details  Name: Tony Schmitt MRN: [id number] Date of Birth: 1970-12-13  Today's Date: 04/27/2020 OT Individual Time: 6578-4696 OT Individual Time Calculation (min): 44 min    Short Term Goals: Week 1:  OT Short Term Goal 1 (Week 1): Pt will transfer to toilet with min A sq pivot. OT Short Term Goal 2 (Week 1): Pt will sit to stand with CGA during toileting. OT Short Term Goal 3 (Week 1): Pt will be able to manage clothing over hips with CGA. OT Short Term Goal 4 (Week 1): Pt will complete self ROM for LUE to prevent edema independently. OT Short Term Goal 5 (Week 1): Pt will be able to don a shirt with min A using some active L shoulder movement.  Skilled Therapeutic Interventions/Progress Updates:    1:1. Pt received in w/c agreeable to OT with no pain and requesting to toilet. Pt completes stand pivot transfer with grab bar and MIN A to transfer to toilet. Pt requires MOD A for standing balance d.t L lean in standing during CM prior to toielting, S for urine void and total A for advancing pants past hips in standing after toielting. Pt transfers onto mat in tx gym with MIN A. Pt completes supine NMR for shoulder, scapula, and bicep/tricep to improve stability in targeted muscle groups and increasing activation during deep proprioceptive resistive activities. OT applied K tape to L dorsum of the hand to improve edema and activation. Exited sessionw iht pt seated in bed, exit alarm on and call light in reach  Therapy Documentation Precautions:  Precautions Precautions: Fall, Other (comment) Precaution Comments: left hemiparesis Restrictions Weight Bearing Restrictions: No General:   Vital Signs:  Pain: Pain Assessment Pain Scale: 0-10 Pain Score: 0-No pain ADL: ADL Eating: Set up Grooming: Setup Upper Body Bathing: Moderate assistance Where Assessed-Upper Body Bathing: Chair Lower Body Bathing: Moderate assistance Where  Assessed-Lower Body Bathing: Chair Upper Body Dressing: Maximal assistance Where Assessed-Upper Body Dressing: Chair Lower Body Dressing: Maximal assistance Where Assessed-Lower Body Dressing: Chair Toileting: Maximal assistance Where Assessed-Toileting: Bedside Commode Toilet Transfer: Moderate assistance Toilet Transfer Method: Squat pivot, Stand pivot Toilet Transfer Equipment: Energy manager: Moderate assistance Social research officer, government Method: Education officer, environmental: Radio broadcast assistant, Grab bars (simulated) Vision   Perception    Praxis   Exercises:   Other Treatments:     Therapy/Group: Individual Therapy  [name] 04/27/2020, 10:14 AM

## 2020-04-27 NOTE — Progress Notes (Signed)
Patient ID: Tony Schmitt, male   DOB: May 24, 1971, 49 y.o.   MRN: [id number]  Met with pt and left message for his Mom to discuss team conference goals supervision-CGA and target discharge date 10/7. Pt can see his progress just wished it was faster. Will continue to work on discharge needs and have Mom come in closer to is discharge for family training.

## 2020-04-27 NOTE — Progress Notes (Signed)
Patient ID: Tony Schmitt, male   DOB: 05-Oct-1970, 49 y.o.   MRN: [id number] S: No new complaints O:BP (!) 147/85 (BP Location: Right Arm)   Pulse 86   Temp 98.7 F (37.1 C) (Oral)   Resp 16   Ht 5\' 4"  (1.626 m)   Wt 77.5 kg   SpO2 99%   BMI 29.33 kg/m   Intake/Output Summary (Last 24 hours) at 04/27/2020 1224 Last data filed at 04/27/2020 0745 Gross per 24 hour  Intake 440 ml  Output 700 ml  Net -260 ml   Intake/Output: I/O last 3 completed shifts: In: 57 [P.O.:460] Out: 1400 [Urine:1400]  Intake/Output this shift:  Total I/O In: 100 [P.O.:100] Out: -  Weight change:  Gen: NAD CVS: no rub Resp: cta Abd: benign Ext: trace pretibial edema  Recent Labs  Lab 04/21/20 0950 04/22/20 0136 04/22/20 1553 04/25/20 1407 04/26/20 1258 04/27/20 0655  NA 137 138  --  137 134* 136  K 3.7 3.7  --  4.1 4.2 3.7  CL 109 109  --  103 100 101  CO2 21* 23  --  26 23 27   GLUCOSE 210* 156*  --  183* 113* 121*  BUN 30* 32*  --  31* 33* 33*  CREATININE 2.92* 3.10* 3.21* 3.24* 3.07* 3.19*  ALBUMIN  --   --   --  1.5* 1.7* 1.5*  CALCIUM 8.7* 8.9  --  9.3 9.6 9.6  PHOS  --   --   --   --  3.1 3.2  AST  --   --   --  30  --   --   ALT  --   --   --  34  --   --    Liver Function Tests: Recent Labs  Lab 04/25/20 1407 04/26/20 1258 04/27/20 0655  AST 30  --   --   ALT 34  --   --   ALKPHOS 106  --   --   BILITOT 0.7  --   --   PROT 5.2*  --   --   ALBUMIN 1.5* 1.7* 1.5*   No results for input(s): LIPASE, AMYLASE in the last 168 hours. No results for input(s): AMMONIA in the last 168 hours. CBC: Recent Labs  Lab 04/21/20 0950 04/21/20 0950 04/22/20 0136 04/22/20 0136 04/22/20 1553 04/25/20 0717 04/27/20 0655  WBC 9.2   < > 10.3   < > 9.7 11.2* 11.4*  NEUTROABS  --   --   --   --   --  7.6  --   HGB 9.6*   < > 9.3*   < > 9.6* 8.1* 10.0*  HCT 30.0*   < > 29.1*   < > 30.7* 25.9* 31.9*  MCV 84.3  --  84.3  --  84.6 86.3 84.2  PLT 386   < > 393   < > 394 306 455*   < >  = values in this interval not displayed.   Cardiac Enzymes: No results for input(s): CKTOTAL, CKMB, CKMBINDEX, TROPONINI in the last 168 hours. CBG: Recent Labs  Lab 04/26/20 1135 04/26/20 1625 04/26/20 2100 04/27/20 0611 04/27/20 1120  GLUCAP 112* 182* 140* 129* 141*    Iron Studies:  Recent Labs    04/27/20 0655  IRON 20*  TIBC 141*  FERRITIN 635*   Studies/Results: US RENAL  Result Date: 04/26/2020 CLINICAL DATA:  Acute kidney injury EXAM: RENAL / URINARY TRACT ULTRASOUND COMPLETE COMPARISON:  None. FINDINGS:  Right Kidney: Renal measurements: 11.6 x 5 x 5.5 cm = volume: 169 mL. There is no hydronephrosis. There is a 1.6 cm cyst arising from the lower pole. Left Kidney: Renal measurements: 10.9 x 5.7 x 4.1 cm = volume: Is 134 mL. There is a 1.3 cm cyst arising from the lower pole. Bladder: Appears normal for degree of bladder distention. Other: None. IMPRESSION: Unremarkable exam.  No hydronephrosis. Electronically Signed   By: Constance Holster M.D.   On: 04/26/2020 21:27   . (feeding supplement) PROSource Plus  30 mL Oral BID BM  . amLODipine  10 mg Oral Daily  . aspirin  81 mg Oral Daily  . atorvastatin  80 mg Oral QHS  . blood pressure control book   Does not apply Once  . enoxaparin (LOVENOX) injection  30 mg Subcutaneous Q24H  . insulin aspart  0-9 Units Subcutaneous TID WC  . insulin glargine  6 Units Subcutaneous Daily  . living well with diabetes book   Does not apply Once  . metoprolol tartrate  12.5 mg Oral BID  . pantoprazole  40 mg Oral Daily  . polyethylene glycol  17 g Oral BID  . senna-docusate  1 tablet Oral QHS  . sodium bicarbonate  1,300 mg Oral BID    BMET    Component Value Date/Time   NA 136 04/27/2020 0655   NA 140 08/19/2019 1552   K 3.7 04/27/2020 0655   CL 101 04/27/2020 0655   CO2 27 04/27/2020 0655   GLUCOSE 121 (H) 04/27/2020 0655   BUN 33 (H) 04/27/2020 0655   BUN 23 08/19/2019 1552   CREATININE 3.19 (H) 04/27/2020 0655    CALCIUM 9.6 04/27/2020 0655   GFRNONAA 21 (L) 04/27/2020 0655   GFRAA 25 (L) 04/27/2020 0655   CBC    Component Value Date/Time   WBC 11.4 (H) 04/27/2020 0655   RBC 3.79 (L) 04/27/2020 0655   HGB 10.0 (L) 04/27/2020 0655   HGB 11.8 (L) 08/19/2019 1526   HCT 31.9 (L) 04/27/2020 0655   HCT 36.4 (L) 08/19/2019 1526   PLT 455 (H) 04/27/2020 0655   PLT 429 08/19/2019 1526   MCV 84.2 04/27/2020 0655   MCV 84 08/19/2019 1526   MCH 26.4 04/27/2020 0655   MCHC 31.3 04/27/2020 0655   RDW 13.6 04/27/2020 0655   RDW 13.2 08/19/2019 1526   LYMPHSABS 2.4 04/25/2020 0717   LYMPHSABS 3.2 (H) 08/19/2019 1526   MONOABS 0.7 04/25/2020 0717   EOSABS 0.3 04/25/2020 0717   EOSABS 0.5 (H) 08/19/2019 1526   BASOSABS 0.1 04/25/2020 0717   BASOSABS 0.1 08/19/2019 1526     Assessment/Plan: 1.  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  Underlying CKD likely due to combination of poorly controlled DM and HTN (last Scr 1.84 on 08/19/19), as well as previous episodes of AKI/CKD.  He was on lisinopril prior to admission, however he presented with AKI and lisinopril was held.  His Scr peaked at 3.5 on 04/16/20 but improved to nadir of 2.7 only to rise to 3-3.2 over the past few days.   1. 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 2. Renal US negative for obstruction.  3. Stressed the importance of controlling BP with goal <130/80, tight glucose control (goal Hgb A1c 7%), use of an ACE or ARB, and avoidance of NSAIDs/COX-II I's 4. Holding benazepril for now given AKI. 2. 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  On lovenox and was to have loop recorder once he gets his insurance established. 3. HTN- not at goal.  Will likely need to increase metoprolol to 25 mg bid if ok with neuro and PMR 4. DM- per primary 5. Anemia of CKD- low iron stores.  Will order IV Feraheme and follow H/H.  He will likely require initiation of ESA. 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

## 2020-04-28 ENCOUNTER — Inpatient Hospital Stay (HOSPITAL_COMMUNITY): Payer: Self-pay

## 2020-04-28 ENCOUNTER — Inpatient Hospital Stay (HOSPITAL_COMMUNITY): Payer: Self-pay | Admitting: Occupational Therapy

## 2020-04-28 LAB — RENAL FUNCTION PANEL
Albumin: 1.4 g/dL — ABNORMAL LOW (ref 3.5–5.0)
Anion gap: 7 (ref 5–15)
BUN: 37 mg/dL — ABNORMAL HIGH (ref 6–20)
CO2: 25 mmol/L (ref 22–32)
Calcium: 9.1 mg/dL (ref 8.9–10.3)
Chloride: 106 mmol/L (ref 98–111)
Creatinine, Ser: 3.2 mg/dL — ABNORMAL HIGH (ref 0.61–1.24)
GFR calc Af Amer: 25 mL/min — ABNORMAL LOW (ref >60)
GFR calc non Af Amer: 21 mL/min — ABNORMAL LOW (ref >60)
Glucose, Bld: 120 mg/dL — ABNORMAL HIGH (ref 70–99)
Phosphorus: 3.1 mg/dL (ref 2.5–4.6)
Potassium: 4.3 mmol/L (ref 3.5–5.1)
Sodium: 138 mmol/L (ref 135–145)

## 2020-04-28 LAB — GLUCOSE, CAPILLARY
Glucose-Capillary: 144 mg/dL — ABNORMAL HIGH (ref 70–99)
Glucose-Capillary: 114 mg/dL — ABNORMAL HIGH (ref 70–99)
Glucose-Capillary: 145 mg/dL — ABNORMAL HIGH (ref 70–99)
Glucose-Capillary: 180 mg/dL — ABNORMAL HIGH (ref 70–99)

## 2020-04-28 LAB — FOLATE RBC
Folate, Hemolysate: 295 ng/mL
Folate, RBC: 908 ng/mL (ref >498)
Hematocrit: 32.5 % — ABNORMAL LOW (ref 37.5–51.0)

## 2020-04-28 LAB — PARATHYROID HORMONE, INTACT (NO CA): PTH: 50 pg/mL (ref 15–65)

## 2020-04-28 LAB — IMMUNOFIXATION, URINE

## 2020-04-28 MED ORDER — METOPROLOL TARTRATE 25 MG PO TABS
25.0000 mg | ORAL_TABLET | Freq: Two times a day (BID) | ORAL | Status: DC
  Administered 2020-04-28 – 2020-04-29 (×2): 25 mg via ORAL
  Filled 2020-04-28 (×2): qty 1

## 2020-04-28 MED ORDER — INSULIN GLARGINE 100 UNIT/ML ~~LOC~~ SOLN
7.0000 [IU] | Freq: Every day | SUBCUTANEOUS | Status: DC
  Administered 2020-04-29 – 2020-05-08 (×10): 7 [IU] via SUBCUTANEOUS
  Filled 2020-04-28 (×11): qty 0.07

## 2020-04-28 NOTE — Progress Notes (Signed)
Occupational Therapy Session Note  Patient Details  Name: Tony Schmitt MRN: [id number] Date of Birth: July 04, 1971  Today's Date: 04/28/2020 OT Individual Time: 1000-1102 OT Individual Time Calculation (min): 62 min    Short Term Goals: Week 1:  OT Short Term Goal 1 (Week 1): Pt will transfer to toilet with min A sq pivot. OT Short Term Goal 2 (Week 1): Pt will sit to stand with CGA during toileting. OT Short Term Goal 3 (Week 1): Pt will be able to manage clothing over hips with CGA. OT Short Term Goal 4 (Week 1): Pt will complete self ROM for LUE to prevent edema independently. OT Short Term Goal 5 (Week 1): Pt will be able to don a shirt with min A using some active L shoulder movement.  Skilled Therapeutic Interventions/Progress Updates:    Pt supine in bed, no c/o pain, agreeable to OT session.  Pt completed supine to sit with CGA and SPT using RW with mod assist for manual positioning of LUE and mgt of RW on left side. Pt self propelled w/c needing mod to max assist to maneuver small space in bathroom.  SPT w/c to TTB needing min assist with grab bar use.  Pt bathed with min assist for RUE and to stabilize LLE into figure 4 for pt to wash foot and ankle safely.  Pt needed CGA to stand to wash buttocks using grab bar.  Pt donned boxers per his request with min assist to thread over left foot and CGA to stand to pull over hips.  SPT TTB to w/c needing min assist using grab bar.  Pt donned shirt sitting in front of sink with increased time and needing min VCs for problem solving.  Pt donned pants needing min assist to thread over left foot and for sit<>stand to pull over hips. Pt requesting back to bed due to chair bother his neck per his report.  Squat pivot transfer completed with min assist w/c to EOB and sit to supine with supervision. Pt needed intermittent VCs to facilitate AROM of LUE and LLE and to limit RUE use in assisting to strengthen and improve motor planning on left side.  Call bell  in reach, bed alarm on.  Therapy Documentation Precautions:  Precautions Precautions: Fall, Other (comment) Precaution Comments: left hemiparesis Restrictions Weight Bearing Restrictions: No   Therapy/Group: Individual Therapy  [name] 04/28/2020, 12:35 PM

## 2020-04-28 NOTE — Progress Notes (Signed)
Physical Therapy Session Note  Patient Details  Name: Tony Schmitt MRN: [id number] Date of Birth: [birth year]  Today's Date: 04/28/2020 PT Individual Time: 0800-0914 PT Individual Time Calculation (min): 74 min   Short Term Goals: Week 1:  PT Short Term Goal 1 (Week 1): Pt will perform sit<>stands with min assist PT Short Term Goal 2 (Week 1): Pt will perform bed<>chair transfers with min assist PT Short Term Goal 3 (Week 1): Pt will ambulate at least 43ft using LRAD with mod assist PT Short Term Goal 4 (Week 1): Pt will ascend/descend 4 steps using HRs with mod assist  Skilled Therapeutic Interventions/Progress Updates:    Patient received supine in bed agreeable to PT. He denies pain and reports that he has to use the restroom. Verbal cues + CGA needed for bed mobility. MinA squat pivot to wc, ModA stand pivot to toilet. Patient able to complete clothing management with MinA to maintain upright posture. He was able to complete pericare with CGA as he maintained significant L lateral lean throughout. MinA stand pivot back to wc. PT propelled patient to gym for time management- he was agreeable to gait tx on LiteGait over treadmill. The following intervals: 3 mins @ 0.31mph for 135ft, 1.92mins @ 0.40mph for 59ft and 1.37mins @ 0.21mph for 60ft. Up to [place] provided for L LE advancement. Patient fluctuates between increased hamstring tone maintaining increased L knee flexion an dgenu recurvatum. Patient requiring seated rest breaks between bouts. He was able to complete 2x5 mins on the N[place]p for large reciprocal stepping. Patient requesting to return to bed. MinA for squat pivot transfer. Bed alarm on, call light within reach.   Therapy Documentation Precautions:  Precautions Precautions: Fall, Other (comment) Precaution Comments: left hemiparesis Restrictions Weight Bearing Restrictions: No    Therapy/Group: Individual Therapy  Karoline Caldwell, PT, DPT, CBIS 04/28/2020,  7:40 AM

## 2020-04-28 NOTE — Progress Notes (Signed)
Patient ID: Tony Schmitt, male   DOB: 07/04/1971, 49 y.o.   MRN: [id number] S: Feels well, no complaints O:BP (!) 164/85 (BP Location: Right Arm)   Pulse 87   Temp 98.4 F (36.9 C)   Resp 16   Ht 5\' 4"  (1.626 m)   Wt 77.5 kg   SpO2 98%   BMI 29.33 kg/m   Intake/Output Summary (Last 24 hours) at 04/28/2020 1228 Last data filed at 04/28/2020 0830 Gross per 24 hour  Intake 1502.35 ml  Output 1500 ml  Net 2.35 ml   Intake/Output: I/O last 3 completed shifts: In: 1402.4 [P.O.:500; I.V.:785.4; IV Piggyback:117] Out: 1900 [Urine:1900]  Intake/Output this shift:  Total I/O In: 200 [P.O.:200] Out: -  Weight change:  Gen: NAD CVS: no rub Resp: cta Abd: benign Ext: trace pretibial edema  Recent Labs  Lab 04/22/20 0136 04/22/20 1553 04/25/20 1407 04/26/20 1258 04/27/20 0655 04/28/20 1118  NA 138  --  137 134* 136 138  K 3.7  --  4.1 4.2 3.7 4.3  CL 109  --  103 100 101 106  CO2 23  --  26 23 27 25   GLUCOSE 156*  --  183* 113* 121* 120*  BUN 32*  --  31* 33* 33* 37*  CREATININE 3.10* 3.21* 3.24* 3.07* 3.19* 3.20*  ALBUMIN  --   --  1.5* 1.7* 1.5* 1.4*  CALCIUM 8.9  --  9.3 9.6 9.6 9.1  PHOS  --   --   --  3.1 3.2 3.1  AST  --   --  30  --   --   --   ALT  --   --  34  --   --   --    Liver Function Tests: Recent Labs  Lab 04/25/20 1407 04/25/20 1407 04/26/20 1258 04/27/20 0655 04/28/20 1118  AST 30  --   --   --   --   ALT 34  --   --   --   --   ALKPHOS 106  --   --   --   --   BILITOT 0.7  --   --   --   --   PROT 5.2*  --   --   --   --   ALBUMIN 1.5*   < > 1.7* 1.5* 1.4*   < > = values in this interval not displayed.   No results for input(s): LIPASE, AMYLASE in the last 168 hours. No results for input(s): AMMONIA in the last 168 hours. CBC: Recent Labs  Lab 04/22/20 0136 04/22/20 0136 04/22/20 1553 04/25/20 0717 04/27/20 0655  WBC 10.3   < > 9.7 11.2* 11.4*  NEUTROABS  --   --   --  7.6  --   HGB 9.3*   < > 9.6* 8.1* 10.0*  HCT 29.1*   < >  30.7* 25.9* 31.9*  MCV 84.3  --  84.6 86.3 84.2  PLT 393   < > 394 306 455*   < > = values in this interval not displayed.   Cardiac Enzymes: No results for input(s): CKTOTAL, CKMB, CKMBINDEX, TROPONINI in the last 168 hours. CBG: Recent Labs  Lab 04/27/20 1120 04/27/20 1635 04/27/20 2142 04/28/20 0611 04/28/20 1120  GLUCAP 141* 160* 214* 144* 114*    Iron Studies:  Recent Labs    04/27/20 0655  IRON 20*  TIBC 141*  FERRITIN 635*   Studies/Results: US RENAL  Result Date: 04/26/2020 CLINICAL DATA:  Acute kidney injury EXAM: RENAL / URINARY TRACT ULTRASOUND COMPLETE COMPARISON:  None. FINDINGS: Right Kidney: Renal measurements: 11.6 x 5 x 5.5 cm = volume: 169 mL. There is no hydronephrosis. There is a 1.6 cm cyst arising from the lower pole. Left Kidney: Renal measurements: 10.9 x 5.7 x 4.1 cm = volume: Is 134 mL. There is a 1.3 cm cyst arising from the lower pole. Bladder: Appears normal for degree of bladder distention. Other: None. IMPRESSION: Unremarkable exam.  No hydronephrosis. Electronically Signed   By: Constance Holster M.D.   On: 04/26/2020 21:27   . (feeding supplement) PROSource Plus  30 mL Oral BID BM  . amLODipine  10 mg Oral Daily  . aspirin  81 mg Oral Daily  . atorvastatin  80 mg Oral QHS  . blood pressure control book   Does not apply Once  . enoxaparin (LOVENOX) injection  30 mg Subcutaneous Q24H  . insulin aspart  0-9 Units Subcutaneous TID WC  . [START ON 04/29/2020] insulin glargine  7 Units Subcutaneous Daily  . living well with diabetes book   Does not apply Once  . metoprolol tartrate  25 mg Oral BID  . pantoprazole  40 mg Oral Daily  . polyethylene glycol  17 g Oral BID  . senna-docusate  1 tablet Oral QHS  . sodium bicarbonate  1,300 mg Oral BID    BMET    Component Value Date/Time   NA 138 04/28/2020 1118   NA 140 08/19/2019 1552   K 4.3 04/28/2020 1118   CL 106 04/28/2020 1118   CO2 25 04/28/2020 1118   GLUCOSE 120 (H) 04/28/2020 1118    BUN 37 (H) 04/28/2020 1118   BUN 23 08/19/2019 1552   CREATININE 3.20 (H) 04/28/2020 1118   CALCIUM 9.1 04/28/2020 1118   GFRNONAA 21 (L) 04/28/2020 1118   GFRAA 25 (L) 04/28/2020 1118   CBC    Component Value Date/Time   WBC 11.4 (H) 04/27/2020 0655   RBC 3.79 (L) 04/27/2020 0655   HGB 10.0 (L) 04/27/2020 0655   HGB 11.8 (L) 08/19/2019 1526   HCT 31.9 (L) 04/27/2020 0655   HCT 36.4 (L) 08/19/2019 1526   PLT 455 (H) 04/27/2020 0655   PLT 429 08/19/2019 1526   MCV 84.2 04/27/2020 0655   MCV 84 08/19/2019 1526   MCH 26.4 04/27/2020 0655   MCHC 31.3 04/27/2020 0655   RDW 13.6 04/27/2020 0655   RDW 13.2 08/19/2019 1526   LYMPHSABS 2.4 04/25/2020 0717   LYMPHSABS 3.2 (H) 08/19/2019 1526   MONOABS 0.7 04/25/2020 0717   EOSABS 0.3 04/25/2020 0717   EOSABS 0.5 (H) 08/19/2019 1526   BASOSABS 0.1 04/25/2020 0717   BASOSABS 0.1 08/19/2019 1526    Assessment/Plan: 1. 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 Underlying CKD likely due to combination of poorly controlled DM and HTN (last Scr 1.84 on 08/19/19), as well as previous episodes of AKI/CKD. He was on lisinopril prior to admission, however he presented with AKI and lisinopril was held. His Scr peaked at 3.5 on 04/16/20 but improved to nadir of 2.7 only to rise to 3-3.2 over the past few days.  1. 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 2. Renal US negative for obstruction.  3. Stressed the importance of controlling BP with goal <130/80, tight glucose control (goal Hgb A1c 7%), use of an ACE or ARB, and avoidance of NSAIDs/COX-II I's 4. Holding benazepril for now  given AKI. 2. 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 On lovenox and was to have loop recorder once he gets his insurance established. 3. HTN- not at goal. Will likely need to increase metoprolol to 25 mg bid if ok with neuro and PMR 4. DM- per primary 5. Anemia of CKD- low iron stores.  Ordered IV  Feraheme and follow H/H.  He will likely require initiation of ESA. 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

## 2020-04-28 NOTE — Progress Notes (Signed)
[place] PHYSICAL MEDICINE & REHABILITATION PROGRESS NOTE   Subjective/Complaints: No complaints this morning Working well with therapy  ROS: Denies CP, SOB, N/V/D  Objective:   US RENAL  Result Date: 04/26/2020 CLINICAL DATA:  Acute kidney injury EXAM: RENAL / URINARY TRACT ULTRASOUND COMPLETE COMPARISON:  None. FINDINGS: Right Kidney: Renal measurements: 11.6 x 5 x 5.5 cm = volume: 169 mL. There is no hydronephrosis. There is a 1.6 cm cyst arising from the lower pole. Left Kidney: Renal measurements: 10.9 x 5.7 x 4.1 cm = volume: Is 134 mL. There is a 1.3 cm cyst arising from the lower pole. Bladder: Appears normal for degree of bladder distention. Other: None. IMPRESSION: Unremarkable exam.  No hydronephrosis. Electronically Signed   By: Constance Holster M.D.   On: 04/26/2020 21:27   Recent Labs    04/27/20 0655  WBC 11.4*  HGB 10.0*  HCT 31.9*  PLT 455*   Recent Labs    04/26/20 1258 04/27/20 0655  NA 134* 136  K 4.2 3.7  CL 100 101  CO2 23 27  GLUCOSE 113* 121*  BUN 33* 33*  CREATININE 3.07* 3.19*  CALCIUM 9.6 9.6    Intake/Output Summary (Last 24 hours) at 04/28/2020 0926 Last data filed at 04/28/2020 0830 Gross per 24 hour  Intake 1502.35 ml  Output 1500 ml  Net 2.35 ml        Physical Exam: Vital Signs Blood pressure (!) 164/85, pulse 87, temperature 98.4 F (36.9 C), resp. rate 16, height 5\' 4"  (1.626 m), weight 77.5 kg, SpO2 98 %. General: Alert and oriented x 3, No apparent distress HEENT: Head is normocephalic, atraumatic, PERRLA, EOMI, sclera anicteric, oral mucosa pink and moist, dentition intact, ext ear canals clear,  Neck: Supple without JVD or lymphadenopathy Heart: Reg rate and rhythm. No murmurs rubs or gallops Chest: CTA bilaterally without wheezes, rales, or rhonchi; no distress Abdomen: Soft, non-tender, non-distended, bowel sounds positive. Extremities: No clubbing, cyanosis, or edema. Pulses are 2+ Skin: Clean and intact without  signs of breakdown Neuro: Alert Motor: RUE/RLE: 4-4+ /5 proximal distal LUE: Shoulder abduction 3 -/5, elbow flexion 3 -/5, hand grip 0/5, unchanged Left lower extremity: Hip flexion 2+/5, knee extension 2+/5, ankle dorsiflexion 0/5, unchanged   Assessment/Plan: 1. Functional deficits secondary to RIght corona radiata infarct which require 3+ hours per day of interdisciplinary therapy in a comprehensive inpatient rehab setting.  Physiatrist is providing close team supervision and 24 hour management of active medical problems listed below.  Physiatrist and rehab team continue to assess barriers to discharge/monitor patient progress toward functional and medical goals  Care Tool:  Bathing  Bathing activity did not occur:  (N/A) Body parts bathed by patient: Chest, Abdomen, Left arm, Front perineal area, Right upper leg, Left upper leg, Face, Buttocks, Left lower leg, Right lower leg   Body parts bathed by helper: Right arm     Bathing assist Assist Level: Minimal Assistance - Patient > 75%     Upper Body Dressing/Undressing Upper body dressing   What is the patient wearing?: Pull over shirt    Upper body assist Assist Level: Supervision/Verbal cueing    Lower Body Dressing/Undressing Lower body dressing      What is the patient wearing?: Pants, Incontinence brief     Lower body assist Assist for lower body dressing: Minimal Assistance - Patient > 75%     Toileting Toileting    Toileting assist Assist for toileting: Supervision/Verbal cueing Assistive Device Comment:  (Urinal)  Transfers Chair/bed transfer  Transfers assist     Chair/bed transfer assist level: Moderate Assistance - Patient 50 - 74%     Locomotion Ambulation   Ambulation assist      Assist level: 2 helpers (modA and w/c follow) Assistive device: Walker-rolling Max distance: 42ft   Walk 10 feet activity   Assist     Assist level: 2 helpers Assistive device: Walker-rolling    Walk 50 feet activity   Assist Walk 50 feet with 2 turns activity did not occur: Safety/medical concerns         Walk 150 feet activity   Assist Walk 150 feet activity did not occur: Safety/medical concerns         Walk 10 feet on uneven surface  activity   Assist Walk 10 feet on uneven surfaces activity did not occur: Safety/medical concerns         Wheelchair     Assist Will patient use wheelchair at discharge?: No (Per PT goals) Type of Wheelchair: Manual    Wheelchair assist level: Minimal Assistance - Patient > 75%, Set up assist Max wheelchair distance: 135ft    Wheelchair 50 feet with 2 turns activity    Assist        Assist Level: Minimal Assistance - Patient > 75%, Set up assist   Wheelchair 150 feet activity     Assist      Assist Level: Minimal Assistance - Patient > 75%, Set up assist   Blood pressure (!) 164/85, pulse 87, temperature 98.4 F (36.9 C), resp. rate 16, height 5\' 4"  (1.626 m), weight 77.5 kg, SpO2 98 %.    Medical Problem List and Plan: 1.  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   Continue CIR  WHO/PRAFO nightly 2.  Antithrombotics: -DVT/anticoagulation: Lovenox             -antiplatelet therapy: Aspirin 81 mg daily 3. Pain Management: Tylenol as needed. Well controlled.  4. Mood: Provide emotional support             -antipsychotic agents: N/A 5. Neuropsych: This patient is capable of making decisions on his own behalf. 6. Skin/Wound Care: Routine skin checks 7. Fluids/Electrolytes/Nutrition: Routine in and outs.   8.  Hypertension.    Lisinopril PTA, on hold due to AKI  Norvasc 10 mg daily  Lopressor 12.5 mg twice daily started on 9/21          Vitals:   04/27/20 2026 04/28/20 0557  BP: (!) 154/83 (!) 164/85  Pulse: 86 87  Resp: 18 16  Temp: 98.6 F (37 C) 98.4 F (36.9 C)  SpO2: 98%  98%   9/23: Quite elevated. Increase Lopressor to 25mg  BID.  9.  Diabetes mellitus with hyperglycemia.  Hemoglobin A1c 9.6.  Check blood sugars before meals and at bedtime.  Diabetic teaching             CBG (last 3)  Recent Labs    04/27/20 1635 04/27/20 2142 04/28/20 0611  GLUCAP 160* 214* 144*   Uncontrolled. Increase Lantus to 7U HS.  10.  AKI on CKD stage III.  Creatinine baseline 2.42 08/02/2018.  Renal ultrasound 04/17/2019 showed no hydronephrosis or shadowing stone.  Started on sodium bicarbonate 1300 mg twice daily 04/17/2020.               Creatinine 3.19 on 9/22  EF reviewed-60-65%  IVF nightly x3 nights ordered  on 9/22  Encourage fluids 11.  Tobacco abuse.  Counsel 12.  Hyperlipidemia: Lipitor 13.  Slow transit constipation  Bowel meds increased again on 9/21 14. Leukocytosis:   WBCs 11.4 on 9/22    Afebrile  Continue to monitor 15.  Acute blood loss anemia?  On chronic anemia  Hemoglobin 10.0 on 9/22 16.  Severe hypoalbuminemia  Supplement initiated on 9/21  LOS: 6 days A FACE TO FACE EVALUATION WAS PERFORMED  Tony Schmitt 04/28/2020, 9:26 AM

## 2020-04-28 NOTE — Progress Notes (Signed)
Physical Therapy Session Note  Patient Details  Name: Tony Schmitt MRN: [id number] Date of Birth: 1971-03-31  Today's Date: 04/28/2020 PT Individual Time: 1300-1415 PT Individual Time Calculation (min): 75 min   Short Term Goals: Week 1:  PT Short Term Goal 1 (Week 1): Pt will perform sit<>stands with min assist PT Short Term Goal 2 (Week 1): Pt will perform bed<>chair transfers with min assist PT Short Term Goal 3 (Week 1): Pt will ambulate at least 51ft using LRAD with mod assist PT Short Term Goal 4 (Week 1): Pt will ascend/descend 4 steps using HRs with mod assist  Skilled Therapeutic Interventions/Progress Updates:    Pt received supine in bed, awake and agreeable to PT session, denies pain and reports feeling "refreshed" after getting a shower with OT. Supine<>sit with CGA and bed features, able to sit EOB with CGA progressing to supervision. Donned shoes with modA while seated EOB. Squat<>pivot with minA towards stronger R side to w/c. WC transport with [place] for time management from his room to main therapy gym. Stand<>pivot with minA from w/c to mat table going towards stronger R side. Performed seated there-ex with mirror in front to assist with visual feedback. -1x10 AAROM LLE LAQ -1x10 AAROM LLE hip marches -1x10 AAROM LLE hamstring curls with pillow case to reduce friction -2x10 sit<>stands with CGA and no AD and therapist supporting L knee alignment  Pt then performed pre-gait training with mirror for visual feedback and RW with RW hand splint. Performed forward/backward stepping with LLE towards targeted colored number, requiring min/modA for truncal stability and therapist facilitating lateral weight shifts. Repeated this several times and increasing L step length each trial. Then performed standing toe taps 4x5 reps on 1&3/4" block with min/modA and RW. Assist for L foot placement, hip/knee flexion, and lateral weight shifts.   Pt then ambulated 82ft + 61ft + 36ft (standing  rest breaks) with min/modA and RW + w/c follow for safety. Therapist assisting with LLE foot placement, L hip/knee flexion, and lateral weight shifting. Pt with improved truncal stability and ability to initiate LLE swing! Pt pleased with progress. Wheeled to [place], stand<>pivot from w/c to nustep with minA. Performed x4 minutes at workload of 5 with BLE's only, therapist assisting with L knee alignment, focusing on L hip flex/ext patterns to carryover with gait. Stand<>pivot with minA back to w/c and transported to his room with totalA for time management. Sit<>stand from w/c back to bed with minA, sit>supine with minA for LLE management. Pt ended session supine in bed with HOB elevated ~20deg, 3/4 rails up, needs in reach.   Therapy Documentation Precautions:  Precautions Precautions: Fall, Other (comment) Precaution Comments: left hemiparesis Restrictions Weight Bearing Restrictions: No   Therapy/Group: Individual Therapy  Nishika Parkhurst P Naileah Karg PT 04/28/2020, 1:23 PM

## 2020-04-29 ENCOUNTER — Inpatient Hospital Stay (HOSPITAL_COMMUNITY): Payer: Self-pay

## 2020-04-29 ENCOUNTER — Inpatient Hospital Stay (HOSPITAL_COMMUNITY): Payer: Self-pay | Admitting: Occupational Therapy

## 2020-04-29 LAB — GLUCOSE, CAPILLARY
Glucose-Capillary: 117 mg/dL — ABNORMAL HIGH (ref 70–99)
Glucose-Capillary: 113 mg/dL — ABNORMAL HIGH (ref 70–99)
Glucose-Capillary: 134 mg/dL — ABNORMAL HIGH (ref 70–99)
Glucose-Capillary: 124 mg/dL — ABNORMAL HIGH (ref 70–99)

## 2020-04-29 LAB — RENAL FUNCTION PANEL
Albumin: 1.4 g/dL — ABNORMAL LOW (ref 3.5–5.0)
Anion gap: 6 (ref 5–15)
BUN: 37 mg/dL — ABNORMAL HIGH (ref 6–20)
CO2: 27 mmol/L (ref 22–32)
Calcium: 9.4 mg/dL (ref 8.9–10.3)
Chloride: 108 mmol/L (ref 98–111)
Creatinine, Ser: 3.25 mg/dL — ABNORMAL HIGH (ref 0.61–1.24)
GFR calc Af Amer: 24 mL/min — ABNORMAL LOW (ref >60)
GFR calc non Af Amer: 21 mL/min — ABNORMAL LOW (ref >60)
Glucose, Bld: 129 mg/dL — ABNORMAL HIGH (ref 70–99)
Phosphorus: 3.5 mg/dL (ref 2.5–4.6)
Potassium: 4.4 mmol/L (ref 3.5–5.1)
Sodium: 141 mmol/L (ref 135–145)

## 2020-04-29 LAB — IMMUNOFIXATION ELECTROPHORESIS
IgA: 186 mg/dL (ref 90–386)
IgG (Immunoglobin G), Serum: 473 mg/dL — ABNORMAL LOW (ref 603–1613)
IgM (Immunoglobulin M), Srm: 72 mg/dL (ref 20–172)
Total Protein ELP: 5.1 g/dL — ABNORMAL LOW (ref 6.0–8.5)

## 2020-04-29 LAB — CREATININE, SERUM
Creatinine, Ser: 3.23 mg/dL — ABNORMAL HIGH (ref 0.61–1.24)
GFR calc Af Amer: 25 mL/min — ABNORMAL LOW (ref >60)
GFR calc non Af Amer: 21 mL/min — ABNORMAL LOW (ref >60)

## 2020-04-29 MED ORDER — METOPROLOL TARTRATE 50 MG PO TABS
50.0000 mg | ORAL_TABLET | Freq: Two times a day (BID) | ORAL | Status: DC
  Administered 2020-04-29 – 2020-05-04 (×10): 50 mg via ORAL
  Filled 2020-04-29 (×10): qty 1

## 2020-04-29 NOTE — Progress Notes (Signed)
Patient ID: Tony Schmitt, male   DOB: August 19, 1970, 49 y.o.   MRN: [id number] S: No new complaints O:BP (!) 152/79 (BP Location: Right Arm)   Pulse 85   Temp 97.8 F (36.6 C)   Resp 18   Ht 5\' 4"  (1.626 m)   Wt 77.5 kg   SpO2 100%   BMI 29.33 kg/m   Intake/Output Summary (Last 24 hours) at 04/29/2020 1506 Last data filed at 04/29/2020 0458 Gross per 24 hour  Intake 240 ml  Output 1300 ml  Net -1060 ml   Intake/Output: I/O last 3 completed shifts: In: 1562.4 [P.O.:660; I.V.:785.4; IV [id number]:903] Out: 2150 [Urine:2150]  Intake/Output this shift:  No intake/output data recorded. Weight change:  Gen: NAD CVS: RRR no rub Resp: cta Abd: benign Ext: no edema  Recent Labs  Lab 04/22/20 1553 04/25/20 1407 04/26/20 1258 04/27/20 0655 04/28/20 1118 04/28/20 2322 04/29/20 1100  NA  --  137 134* 136 138  --  141  K  --  4.1 4.2 3.7 4.3  --  4.4  CL  --  103 100 101 106  --  108  CO2  --  26 23 27 25   --  27  GLUCOSE  --  183* 113* 121* 120*  --  129*  BUN  --  31* 33* 33* 37*  --  37*  CREATININE 3.21* 3.24* 3.07* 3.19* 3.20* 3.23* 3.25*  ALBUMIN  --  1.5* 1.7* 1.5* 1.4*  --  1.4*  CALCIUM  --  9.3 9.6 9.6 9.1  --  9.4  PHOS  --   --  3.1 3.2 3.1  --  3.5  AST  --  30  --   --   --   --   --   ALT  --  34  --   --   --   --   --    Liver Function Tests: Recent Labs  Lab 04/25/20 1407 04/26/20 1258 04/27/20 0655 04/28/20 1118 04/29/20 1100  AST 30  --   --   --   --   ALT 34  --   --   --   --   ALKPHOS 106  --   --   --   --   BILITOT 0.7  --   --   --   --   PROT 5.2*  --   --   --   --   ALBUMIN 1.5*   < > 1.5* 1.4* 1.4*   < > = values in this interval not displayed.   No results for input(s): LIPASE, AMYLASE in the last 168 hours. No results for input(s): AMMONIA in the last 168 hours. CBC: Recent Labs  Lab 04/22/20 1553 04/22/20 1553 04/25/20 0717 04/27/20 0655  WBC 9.7  --  11.2* 11.4*  NEUTROABS  --   --  7.6  --   HGB 9.6*  --  8.1* 10.0*   HCT 30.7*   < > 25.9* 31.9*  32.5*  MCV 84.6  --  86.3 84.2  PLT 394  --  306 455*   < > = values in this interval not displayed.   Cardiac Enzymes: No results for input(s): CKTOTAL, CKMB, CKMBINDEX, TROPONINI in the last 168 hours. CBG: Recent Labs  Lab 04/28/20 1120 04/28/20 1625 04/28/20 2058 04/29/20 0614 04/29/20 1226  GLUCAP 114* 145* 180* 117* 113*    Iron Studies:  Recent Labs    04/27/20 0655  IRON 20*  TIBC 141*  FERRITIN 635*   Studies/Results: No results found. . (feeding supplement) PROSource Plus  30 mL Oral BID BM  . amLODipine  10 mg Oral Daily  . aspirin  81 mg Oral Daily  . atorvastatin  80 mg Oral QHS  . blood pressure control book   Does not apply Once  . enoxaparin (LOVENOX) injection  30 mg Subcutaneous Q24H  . insulin aspart  0-9 Units Subcutaneous TID WC  . insulin glargine  7 Units Subcutaneous Daily  . living well with diabetes book   Does not apply Once  . metoprolol tartrate  50 mg Oral BID  . pantoprazole  40 mg Oral Daily  . polyethylene glycol  17 g Oral BID  . senna-docusate  1 tablet Oral QHS  . sodium bicarbonate  1,300 mg Oral BID    BMET    Component Value Date/Time   NA 141 04/29/2020 1100   NA 140 08/19/2019 1552   K 4.4 04/29/2020 1100   CL 108 04/29/2020 1100   CO2 27 04/29/2020 1100   GLUCOSE 129 (H) 04/29/2020 1100   BUN 37 (H) 04/29/2020 1100   BUN 23 08/19/2019 1552   CREATININE 3.25 (H) 04/29/2020 1100   CALCIUM 9.4 04/29/2020 1100   GFRNONAA 21 (L) 04/29/2020 1100   GFRAA 24 (L) 04/29/2020 1100   CBC    Component Value Date/Time   WBC 11.4 (H) 04/27/2020 0655   RBC 3.79 (L) 04/27/2020 0655   HGB 10.0 (L) 04/27/2020 0655   HGB 11.8 (L) 08/19/2019 1526   HCT 31.9 (L) 04/27/2020 0655   HCT 32.5 (L) 04/27/2020 0655   PLT 455 (H) 04/27/2020 0655   PLT 429 08/19/2019 1526   MCV 84.2 04/27/2020 0655   MCV 84 08/19/2019 1526   MCH 26.4 04/27/2020 0655   MCHC 31.3 04/27/2020 0655   RDW 13.6 04/27/2020  0655   RDW 13.2 08/19/2019 1526   LYMPHSABS 2.4 04/25/2020 0717   LYMPHSABS 3.2 (H) 08/19/2019 1526   MONOABS 0.7 04/25/2020 0717   EOSABS 0.3 04/25/2020 0717   EOSABS 0.5 (H) 08/19/2019 1526   BASOSABS 0.1 04/25/2020 0717   BASOSABS 0.1 08/19/2019 1526     Assessment/Plan: 1. 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 Underlying CKD likely due to combination of poorly controlled DM and HTN(last Scr 1.84 on 08/19/19), as well as previous episodes of AKI/CKD. He was on lisinopril prior to admission, however he presented with AKI and lisinopril was held. His Scr peaked at 3.5 on 04/16/20 but improved to nadir of 2.7 only to rise to 3-3.2 over the past few days.  1. 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 2. Renal USnegative for obstruction.  3. Stressed the importance of controlling BP with goal <130/80, tight glucose control (goal Hgb A1c 7%), use of an ACE or ARB, and avoidance of NSAIDs/COX-II I's 4. Holding benazepril for now given AKI. 5. Will sign off for now.  Please call with questions or concerns.  Will arrange for outpatient follow up after discharge. 6. Will order renal panel and cbc weekly and if there is a change, please re-consult. 2. 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 On lovenox and was to have loop recorder once he gets his insurance established. 3. HTN- not at goal. Will likely need to increase metoprolol to 25 mg bid if ok with neuro and PMR 4. DM- per primary 5. Anemia of CKD-low iron stores.  Ordered IV Feraheme and follow H/H. He will likely require initiation of ESA eventually but had a good response to IV iron. 6. SHTPH-  Phosphorus at goal, iPTH 50.  No need for binders or vit D for now.  Donetta Potts, MD [employer] [phone]

## 2020-04-29 NOTE — Plan of Care (Signed)
Nutrition Education Note  RD consulted for nutrition education regarding elevated CBG's.  Spoke with pt at bedside. Pt eating lunch at time of RD visit.  Pt reports that he began making changes in his diet back in July of this year. For example, pt has decreased fast food intake and decreased sweets intake. Pt has also reduced the amount of soda he drinks to "once in a blue moon." Pt reports that he still does drink sweet tea in addition to water.  Discussed with pt and family member ways to balance a plate to maximize blood sugar control. Utilized pt's meal tray as an example. Pt with multiple questions regarding specific foods. Discussed appropriate portion sizes and provided examples of appropriate meals.  Lab Results  Component Value Date   HGBA1C 9.6 (H) 04/16/2020    RD provided "Carbohydrate Counting for People with Diabetes" handout from the Academy of [employer]. Discussed different food groups and their effects on blood sugar, emphasizing carbohydrate-containing foods. Provided list of carbohydrates and recommended serving sizes of common foods.  Discussed importance of controlled and consistent carbohydrate intake throughout the day. Provided examples of ways to balance meals/snacks and encouraged intake of high-fiber, whole grain complex carbohydrates. Teach back method used.  Expect fair to good compliance.  Current diet order is Heart Healthy/Carb Modified, patient is consuming approximately 100% of meals at this time. Labs and medications reviewed. No further nutrition interventions warranted at this time. RD contact information provided. If additional nutrition issues arise, please re-consult RD.   Gaynell Face, MS, RD, LDN Inpatient Clinical Dietitian Please see [facility] for contact information.

## 2020-04-29 NOTE — Progress Notes (Signed)
Physical Therapy Session Note  Patient Details  Name: Tony Schmitt MRN: [id number] Date of Birth: [birth year]  Today's Date: 04/29/2020 PT Individual Time: 1100-1200 PT Individual Time Calculation (min): 60 min   Short Term Goals: Week 1:  PT Short Term Goal 1 (Week 1): Pt will perform sit<>stands with min assist PT Short Term Goal 2 (Week 1): Pt will perform bed<>chair transfers with min assist PT Short Term Goal 3 (Week 1): Pt will ambulate at least 7ft using LRAD with mod assist PT Short Term Goal 4 (Week 1): Pt will ascend/descend 4 steps using HRs with mod assist Week 2:  PT Short Term Goal 1 (Week 2): Pt will perform sit<>stand transfers with CGA and LRAD PT Short Term Goal 2 (Week 2): Pt will perform bed<>chair transfers with CGA and LRAD PT Short Term Goal 3 (Week 2): Pt will ambulate at least 133ft with minA and LRAD PT Short Term Goal 4 (Week 2): Pt will ascend/descend 4 steps with modA and LRAD  Skilled Therapeutic Interventions/Progress Updates:    Pt presents in bed. Session focusing on w/c mobility training and NMR during functional mobility including balance, bed mobility, transfers, and gait.  Supervision to min assist (for LLE management to return to supine) for supine <> sit x 3 with use of bedrail features for support. Supervision dynamic sitting balance EOB while PT assisted with donning of shoes. Cues for LUE management during bed mobility and functional balance. Heavy min assist for stand step transfer to w/c to the L without AD. Cues and facilitation for weightshift for advancement of LLE. Blocked practice for squat pivot technique transfers with emphasis on squat vs standing up to decrease pt fall risk due to impaired postural control and coordination/strength in LLE. Progressed to CGA to the L and min to the R. Blocked practice sit <> stands and focus on equal weightbearing and forced use in LLE in stance with focus on knee control during weightshifting activities.  Progressed activity with 2" block under RLE. Noted to require increased assitance for balance from min to mod assist. Pre-gait for stepping R forward nad back with focus on L knee control in stance, tendency for hyperextension but able to correct 50% of the time. NMR during gait with RW and L hand orthosis with focus on L knee control and foot placement and hip flexion activation. Cues for positioning of RW especially during turns (requires up to mod assist at time during turning or retro gait) x 30', then 15' forward and 15' backwards and then 25' again with L heel wedge in shoe. DF ACE wrap was donned to assist with L foot clearance due to decreased ankle DF. Noted significant L knee hyperextension during gait despite trial with heel wedge in shoe. May need taller wedge or may benefit from full AFO trial for ankle and knee support. Discussed with primary PT.  End of session transferred back to bed min assist for squat pivot and required min assist for LLE management onto the bed.  Education provided throughout on stroke recovery, AFO/bracing potential, and gait mechanics. Pt receptive to all education.   Instructed in hemi technique for w/c mobility to increase functional independence with mobility with overall supervision and cues for efficient technique.   Therapy Documentation Precautions:  Precautions Precautions: Fall, Other (comment) Precaution Comments: left hemiparesis Restrictions Weight Bearing Restrictions: No   Pain: Pain Assessment Pain Score: 0-No pain    Therapy/Group: Individual Therapy  Canary Brim Ivory Broad, PT,  DPT, CBIS  04/29/2020, 1:01 PM

## 2020-04-29 NOTE — Progress Notes (Signed)
Occupational Therapy Weekly Progress Note  Patient Details  Name: Tony Schmitt MRN: [id number] Date of Birth: 03-02-1971  Beginning of progress report period: April 23, 2020 End of progress report period: April 29, 2020  Today's Date: 04/29/2020 OT Individual Time: 7510-2585 OT Individual Time Calculation (min): 60 min    Patient has met 5 of 5 short term goals.  Pt has made excellent progress this week utilizing Adaptive strategies, completing mobility more independently (CGA to stand, min to transfer) and improved shoulder AROM. He continues to have a flaccid L hand with edema. Continuous pt education on self retrograde massage.   Patient continues to demonstrate the following deficits: abnormal tone, unbalanced muscle activation and decreased coordination, decreased visual acuity, decreased attention to left and decreased postural control, hemiplegia and decreased balance strategies and therefore will continue to benefit from skilled OT intervention to enhance overall performance with BADL.  Patient progressing toward long term goals..  Continue plan of care.  OT Short Term Goals Week 1:  OT Short Term Goal 1 (Week 1): Pt will transfer to toilet with min A sq pivot. OT Short Term Goal 1 - Progress (Week 1): Met OT Short Term Goal 2 (Week 1): Pt will sit to stand with CGA during toileting. OT Short Term Goal 2 - Progress (Week 1): Met OT Short Term Goal 3 (Week 1): Pt will be able to manage clothing over hips with CGA. OT Short Term Goal 3 - Progress (Week 1): Met OT Short Term Goal 4 (Week 1): Pt will complete self ROM for LUE to prevent edema independently. OT Short Term Goal 4 - Progress (Week 1): Met OT Short Term Goal 5 (Week 1): Pt will be able to don a shirt with min A using some active L shoulder movement. OT Short Term Goal 5 - Progress (Week 1): Met Week 2:  OT Short Term Goal 1 (Week 2): Pt will be able to complete stand pivot transfer to toilet with CGA. OT Short  Term Goal 2 (Week 2): Pt will be able to don shoes with min A. OT Short Term Goal 3 (Week 2): Pt will be able to lift shoulder to 90 degrees abduction during bathing. OT Short Term Goal 4 (Week 2): Pt will be able to stand at sink to complete grooming with CGA.      Skilled Therapeutic Interventions/Progress Updates:      Pt seen for BADL retraining of toileting, bathing, and dressing with a focus on functional mobility, Adaptive techniques, balance. See ADL documentation below.  Today in shower pt used a long sponge to reach his feet and R arm.  Improving shoulder abd / flex to 75-80 degrees. Pt participated very well and followed cues well.  He maintained stand balance with CGA in shower and during dressing. At end of session, resting in wc with lap tray support on, belt alarm and all needs met.     Therapy Documentation Precautions:  Precautions Precautions: Fall, Other (comment) Precaution Comments: left hemiparesis Restrictions Weight Bearing Restrictions: No     Pain: Pain Assessment Pain Score: 0-No pain ADL: ADL Eating: Set up Grooming: Setup Upper Body Bathing: Setup Where Assessed-Upper Body Bathing: Shower Lower Body Bathing: Contact guard Where Assessed-Lower Body Bathing: Shower Upper Body Dressing: Supervision/safety Where Assessed-Upper Body Dressing: Chair Lower Body Dressing: Moderate assistance Where Assessed-Lower Body Dressing: Chair Toileting: Moderate assistance Where Assessed-Toileting: Bedside Commode Toilet Transfer: Minimal assistance Toilet Transfer Method: Squat pivot, Stand pivot Toilet Transfer Equipment: Grab bars Walk-In  Shower Transfer: Environmental education officer Method: Radiographer, therapeutic: Radio broadcast assistant, Grab bars (simulated)   Therapy/Group: Individual Therapy  Sanders 04/29/2020, 12:34 PM

## 2020-04-29 NOTE — Progress Notes (Signed)
[facility] PHYSICAL MEDICINE & REHABILITATION PROGRESS NOTE   Subjective/Complaints: No complaints this morning. Denies pain, constipation, insomnia.   ROS: Denies CP, SOB, N/V/D  Objective:   No results found. Recent Labs    04/27/20 0655  WBC 11.4*  HGB 10.0*  HCT 31.9*  32.5*  PLT 455*   Recent Labs    04/27/20 0655 04/27/20 0655 04/28/20 1118 04/28/20 2322  NA 136  --  138  --   K 3.7  --  4.3  --   CL 101  --  106  --   CO2 27  --  25  --   GLUCOSE 121*  --  120*  --   BUN 33*  --  37*  --   CREATININE 3.19*   < > 3.20* 3.23*  CALCIUM 9.6  --  9.1  --    < > = values in this interval not displayed.    Intake/Output Summary (Last 24 hours) at 04/29/2020 1103 Last data filed at 04/29/2020 0458 Gross per 24 hour  Intake 460 ml  Output 1300 ml  Net -840 ml        Physical Exam: Vital Signs Blood pressure (!) 152/79, pulse 85, temperature 97.8 F (36.6 C), resp. rate 18, height 5\' 4"  (1.626 m), weight 77.5 kg, SpO2 100 %. General: Alert, No apparent distress HEENT: Head is normocephalic, atraumatic, PERRLA, EOMI, sclera anicteric, oral mucosa pink and moist, dentition intact, ext ear canals clear,  Neck: Supple without JVD or lymphadenopathy Heart: Reg rate and rhythm. No murmurs rubs or gallops Chest: CTA bilaterally without wheezes, rales, or rhonchi; no distress Abdomen: Soft, non-tender, non-distended, bowel sounds positive. Extremities: No clubbing, cyanosis, or edema. Pulses are 2+ Skin: Clean and intact without signs of breakdown Neuro: Alert Motor: RUE/RLE: 4-4+ /5 proximal distal LUE: Shoulder abduction 3 -/5, elbow flexion 3 -/5, hand grip 0/5, unchanged Left lower extremity: Hip flexion 2+/5, knee extension 2+/5, ankle dorsiflexion 0/5, unchanged     Assessment/Plan: 1. Functional deficits secondary to RIght corona radiata infarct which require 3+ hours per day of interdisciplinary therapy in a comprehensive inpatient rehab  setting.  Physiatrist is providing close team supervision and 24 hour management of active medical problems listed below.  Physiatrist and rehab team continue to assess barriers to discharge/monitor patient progress toward functional and medical goals  Care Tool:  Bathing  Bathing activity did not occur:  (N/A) Body parts bathed by patient: Chest, Abdomen, Left arm, Front perineal area, Right upper leg, Left upper leg, Face, Buttocks, Left lower leg, Right lower leg   Body parts bathed by helper: Right arm     Bathing assist Assist Level: Minimal Assistance - Patient > 75%     Upper Body Dressing/Undressing Upper body dressing   What is the patient wearing?: Pull over shirt    Upper body assist Assist Level: Supervision/Verbal cueing    Lower Body Dressing/Undressing Lower body dressing      What is the patient wearing?: Pants, Underwear/pull up     Lower body assist Assist for lower body dressing: Minimal Assistance - Patient > 75%     Toileting Toileting    Toileting assist Assist for toileting: Supervision/Verbal cueing Assistive Device Comment:  (Urinal)   Transfers Chair/bed transfer  Transfers assist     Chair/bed transfer assist level: Minimal Assistance - Patient > 75%     Locomotion Ambulation   Ambulation assist      Assist level: 2 helpers (modA and w/c  follow) Assistive device: Walker-rolling Max distance: 41ft   Walk 10 feet activity   Assist     Assist level: 2 helpers Assistive device: Walker-rolling   Walk 50 feet activity   Assist Walk 50 feet with 2 turns activity did not occur: Safety/medical concerns         Walk 150 feet activity   Assist Walk 150 feet activity did not occur: Safety/medical concerns         Walk 10 feet on uneven surface  activity   Assist Walk 10 feet on uneven surfaces activity did not occur: Safety/medical concerns         Wheelchair     Assist Will patient use wheelchair at  discharge?: No (Per PT goals) Type of Wheelchair: Manual    Wheelchair assist level: Minimal Assistance - Patient > 75%, Set up assist Max wheelchair distance: 159ft    Wheelchair 50 feet with 2 turns activity    Assist        Assist Level: Minimal Assistance - Patient > 75%, Set up assist   Wheelchair 150 feet activity     Assist      Assist Level: Minimal Assistance - Patient > 75%, Set up assist   Blood pressure (!) 152/79, pulse 85, temperature 97.8 F (36.6 C), resp. rate 18, height 5\' 4"  (1.626 m), weight 77.5 kg, SpO2 100 %.    Medical Problem List and Plan: 1.  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   Continue CIR  WHO/PRAFO nightly 2.  Antithrombotics: -DVT/anticoagulation: Lovenox             -antiplatelet therapy: Aspirin 81 mg daily 3. Pain Management: Tylenol as needed. Well controlled 4. Mood: Provide emotional support             -antipsychotic agents: N/A 5. Neuropsych: This patient is capable of making decisions on his own behalf. 6. Skin/Wound Care: Routine skin checks 7. Fluids/Electrolytes/Nutrition: Routine in and outs.   8.  Hypertension.    Lisinopril PTA, on hold due to AKI  Norvasc 10 mg daily  Lopressor 12.5 mg twice daily started on 9/21          Vitals:   04/28/20 2010 04/29/20 0502  BP: (!) 162/94 (!) 152/79  Pulse: 98 85  Resp: 18 18  Temp: 98.6 F (37 C) 97.8 F (36.6 C)  SpO2: 100% 100%   9/24: increase Lopressor to 50mg  BID- discussed with patient  9.  Diabetes mellitus with hyperglycemia.  Hemoglobin A1c 9.6.  Check blood sugars before meals and at bedtime.  Diabetic teaching             CBG (last 3)  Recent Labs    04/28/20 1625 04/28/20 2058 04/29/20 0614  GLUCAP 145* 180* 117*   9/24: still slightly elevated. RD education ordered. 10.  AKI on CKD stage III.  Creatinine baseline 2.42 08/02/2018.   Renal ultrasound 04/17/2019 showed no hydronephrosis or shadowing stone.  Started on sodium bicarbonate 1300 mg twice daily 04/17/2020.               Creatinine 3.19 on 9/22  EF reviewed-60-65%  IVF nightly x3 nights ordered on 9/22  Encourage fluids 11.  Tobacco abuse.  Counsel 12.  Hyperlipidemia: Lipitor 13.  Slow transit constipation  Bowel meds increased again on 9/21 14. Leukocytosis:   WBCs 11.4 on 9/22    Afebrile  Continue to monitor  15.  Acute blood loss anemia?  On chronic anemia  Hemoglobin 10.0 on 9/22 16.  Severe hypoalbuminemia  Supplement initiated on 9/21  LOS: 7 days A FACE TO FACE EVALUATION WAS PERFORMED  Rubin Dais P Christalyn Goertz 04/29/2020, 11:03 AM

## 2020-04-29 NOTE — Progress Notes (Signed)
Patient ID: Tony Schmitt, male   DOB: 05-30-1971, 49 y.o.   MRN: [id number]  Spoke with [place] assist who is working with pt and Mom on [place] and disability. Mom following up with cash value of pt's life insurance policy and will see if eligible for medicaid. Mom aware of pt's goals and target discharge date 10/7

## 2020-04-29 NOTE — Progress Notes (Signed)
Physical Therapy Weekly Progress Note  Patient Details  Name: Tony Schmitt MRN: [id number] Date of Birth: 11-01-1970  Beginning of progress report period: April 23, 2020 End of progress report period: April 29, 2020  Today's Date: 04/29/2020 PT Individual Time: 5573-2202 PT Individual Time Calculation (min): 70 min   Patient has met 3 of 4 short term goals.  Pt is making progress towards LTG as indicated by his ability to improve compensation techniques for L HB weakness, improved sitting/standing balance, and improved L HB strength. Pt now requires minA for bed mobility, fluctuates b/w min and mod A for stand<>pivot vs squat<>pivot transfers, and has ambulated up to 11f with min/modA and RW with chair follow.   Patient continues to demonstrate the following deficits muscle weakness and muscle joint tightness, impaired timing and sequencing, abnormal tone, unbalanced muscle activation, motor apraxia, decreased coordination and decreased motor planning and decreased attention to left and decreased motor planning and therefore will continue to benefit from skilled PT intervention to increase functional independence with mobility.  Patient progressing toward long term goals..  Continue plan of care.  PT Short Term Goals Week 1:  PT Short Term Goal 1 (Week 1): Pt will perform sit<>stands with min assist PT Short Term Goal 2 (Week 1): Pt will perform bed<>chair transfers with min assist PT Short Term Goal 3 (Week 1): Pt will ambulate at least 389fusing LRAD with mod assist PT Short Term Goal 4 (Week 1): Pt will ascend/descend 4 steps using HRs with mod assist Week 2:  PT Short Term Goal 1 (Week 2): Pt will perform sit<>stand transfers with CGA and LRAD PT Short Term Goal 2 (Week 2): Pt will perform bed<>chair transfers with CGA and LRAD PT Short Term Goal 3 (Week 2): Pt will ambulate at least 10058fith minA and LRAD PT Short Term Goal 4 (Week 2): Pt will ascend/descend 4 steps with  modA and LRAD  Skilled Therapeutic Interventions/Progress Updates:    Pt received supine in bed, awake and agreeable to PT session. Pt denies pain. Supine<>sit with CGA with bed features. Able to sit EOB with SBA. Squat<>pivot with CGA from EOB to w/c, requires reminders and cues for L foot placement. WC transport with [place] time management from his room to main therapy gym. Stand<>pivot with minA from w/c to EOM with no AD. Performed repeated sit<>stand transfers with CGA/minA and no AD from lowered EOM surface with mirror in front to assist with visual feedback. Reps completed until fatigue (~15x). Pt ambulated [id number] minA and RW + w/c follow, therapist facilitating LLE foot placement and swing. Pt demo's overcompensation with LLE adductors resulting L foot externally rotated and limited saggital plane hip flexion. Performed the following supine there-ex on mat table: -3x10 AAROM LLE hip abduction with pillow case to reduce friction -3x10 AAROM LLE heel slides with pillow case to reduce friction and therapist assist with L knee alignment to reduce adductors and facilitate hip fexors -3x10 bridges with 5 sec isometric holds, therapist again assisting with L knee alignment.  -2x10 AAROM LUE elbow flex/ext (flexor tone limiting) -2x10 AAROM LUE shldr external rotation with shldr at 90 deg -2x10 AAROM LUE shldr abd with pillow case to reduce friction -2x10 AROM shldr flex to 90 deg  Supine<>sit with CGA on mat table, squat<>pivot with minA from mat table to w/c. Cues for L foot placement and general technique. WC transport back to his room and squat<>pivot back to bed with minA, sit>supine with minA  for controlled trunk descent. Ended session semi-reclined in bed with bed alarm on, needs in reach.  Therapy Documentation Precautions:  Precautions Precautions: Fall, Other (comment) Precaution Comments: left hemiparesis Restrictions Weight Bearing Restrictions: No  Therapy/Group: Individual  Therapy  Christian P Manhard PT 04/29/2020, 7:39 AM

## 2020-04-30 ENCOUNTER — Inpatient Hospital Stay (HOSPITAL_COMMUNITY): Payer: Self-pay

## 2020-04-30 ENCOUNTER — Inpatient Hospital Stay (HOSPITAL_COMMUNITY): Payer: Self-pay | Admitting: Occupational Therapy

## 2020-04-30 DIAGNOSIS — I6330 Cerebral infarction due to thrombosis of unspecified cerebral artery: Secondary | ICD-10-CM

## 2020-04-30 LAB — GLUCOSE, CAPILLARY
Glucose-Capillary: 94 mg/dL (ref 70–99)
Glucose-Capillary: 144 mg/dL — ABNORMAL HIGH (ref 70–99)
Glucose-Capillary: 168 mg/dL — ABNORMAL HIGH (ref 70–99)
Glucose-Capillary: 187 mg/dL — ABNORMAL HIGH (ref 70–99)

## 2020-04-30 MED ORDER — SORBITOL 70 % SOLN
60.0000 mL | Status: AC
  Administered 2020-04-30: 60 mL via ORAL
  Filled 2020-04-30: qty 60

## 2020-04-30 MED ORDER — SENNOSIDES-DOCUSATE SODIUM 8.6-50 MG PO TABS
2.0000 | ORAL_TABLET | Freq: Every day | ORAL | Status: DC
  Administered 2020-04-30 – 2020-05-11 (×12): 2 via ORAL
  Filled 2020-04-30 (×12): qty 2

## 2020-04-30 NOTE — Progress Notes (Signed)
[place] PHYSICAL MEDICINE & REHABILITATION PROGRESS NOTE   Subjective/Complaints: Denies any new issues. Having to pee a lot (IVF). Pain controlled.   ROS: Patient denies fever, rash, sore throat, blurred vision, nausea, vomiting, diarrhea, cough, shortness of breath or chest pain, headache, or mood change.   Objective:   No results found. No results for input(s): WBC, HGB, HCT, PLT in the last 72 hours. Recent Labs    04/28/20 1118 04/28/20 1118 04/28/20 2322 04/29/20 1100  NA 138  --   --  141  K 4.3  --   --  4.4  CL 106  --   --  108  CO2 25  --   --  27  GLUCOSE 120*  --   --  129*  BUN 37*  --   --  37*  CREATININE 3.20*   < > 3.23* 3.25*  CALCIUM 9.1  --   --  9.4   < > = values in this interval not displayed.    Intake/Output Summary (Last 24 hours) at 04/30/2020 1218 Last data filed at 04/30/2020 0711 Gross per 24 hour  Intake 362 ml  Output 1575 ml  Net -1213 ml        Physical Exam: Vital Signs Blood pressure (!) 177/90, pulse 91, temperature 98.2 F (36.8 C), resp. rate 18, height 5\' 4"  (1.626 m), weight 72.9 kg, SpO2 100 %. Constitutional: No distress . Vital signs reviewed. HEENT: EOMI, oral membranes moist Neck: supple Cardiovascular: RRR without murmur. No JVD    Respiratory/Chest: CTA Bilaterally without wheezes or rales. Normal effort    GI/Abdomen: BS +, non-tender, non-distended Ext: no clubbing, cyanosis, or edema Psych: pleasant and cooperative Skin: Clean and intact without signs of breakdown Neuro: Alert Motor: RUE/RLE: 4-4+ /5 proximal distal LUE: Shoulder abduction 3 -/5, elbow flexion 3 -/5, hand grip 0/5, unchanged Left lower extremity: Hip flexion 2+/5, knee extension 2+/5, ankle dorsiflexion 0 to tr/5,       Assessment/Plan: 1. Functional deficits secondary to RIght corona radiata infarct which require 3+ hours per day of interdisciplinary therapy in a comprehensive inpatient rehab setting.  Physiatrist is providing close  team supervision and 24 hour management of active medical problems listed below.  Physiatrist and rehab team continue to assess barriers to discharge/monitor patient progress toward functional and medical goals  Care Tool:  Bathing  Bathing activity did not occur:  (N/A) Body parts bathed by patient: Chest, Abdomen, Left arm, Front perineal area, Right upper leg, Left upper leg, Face, Buttocks, Left lower leg, Right lower leg, Right arm   Body parts bathed by helper: Right arm     Bathing assist Assist Level: Contact Guard/Touching assist (used long sponge)     Upper Body Dressing/Undressing Upper body dressing   What is the patient wearing?: Pull over shirt    Upper body assist Assist Level: Supervision/Verbal cueing    Lower Body Dressing/Undressing Lower body dressing      What is the patient wearing?: Pants, Underwear/pull up     Lower body assist Assist for lower body dressing: Minimal Assistance - Patient > 75%     Toileting Toileting    Toileting assist Assist for toileting: Supervision/Verbal cueing Assistive Device Comment:  (Urinal)   Transfers Chair/bed transfer  Transfers assist     Chair/bed transfer assist level: Minimal Assistance - Patient > 75%     Locomotion Ambulation   Ambulation assist      Assist level: Moderate Assistance - Patient 50 -  74% Assistive device: Walker-rolling Max distance: 30'   Walk 10 feet activity   Assist     Assist level: Minimal Assistance - Patient > 75% Assistive device: Walker-rolling   Walk 50 feet activity   Assist Walk 50 feet with 2 turns activity did not occur: Safety/medical concerns         Walk 150 feet activity   Assist Walk 150 feet activity did not occur: Safety/medical concerns         Walk 10 feet on uneven surface  activity   Assist Walk 10 feet on uneven surfaces activity did not occur: Safety/medical concerns         Wheelchair     Assist Will patient use  wheelchair at discharge?: No (Per PT goals) Type of Wheelchair: Manual    Wheelchair assist level: Supervision/Verbal cueing Max wheelchair distance: 179ft    Wheelchair 50 feet with 2 turns activity    Assist        Assist Level: Supervision/Verbal cueing   Wheelchair 150 feet activity     Assist      Assist Level: Supervision/Verbal cueing   Blood pressure (!) 177/90, pulse 91, temperature 98.2 F (36.8 C), resp. rate 18, height 5\' 4"  (1.626 m), weight 72.9 kg, SpO2 100 %.    Medical Problem List and Plan: 1.  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   Continue CIR  WHO/PRAFO nightly 2.  Antithrombotics: -DVT/anticoagulation: Lovenox             -antiplatelet therapy: Aspirin 81 mg daily 3. Pain Management: Tylenol as needed. Well controlled 4. Mood: Provide emotional support             -antipsychotic agents: N/A 5. Neuropsych: This patient is capable of making decisions on his own behalf. 6. Skin/Wound Care: Routine skin checks 7. Fluids/Electrolytes/Nutrition: Routine in and outs.   8.  Hypertension.    Lisinopril PTA, on hold due to AKI  Norvasc 10 mg daily  Lopressor 12.5 mg twice daily started on 9/21          Vitals:   04/29/20 1927 04/30/20 0453  BP: (!) 152/84 (!) 177/90  Pulse: 87 91  Resp: 17 18  Temp: 98.3 F (36.8 C) 98.2 F (36.8 C)  SpO2: 100% 100%   9/24: increased Lopressor to 50mg  BID   9/25 observe for patterns today 9.  Diabetes mellitus with hyperglycemia.  Hemoglobin A1c 9.6.  Check blood sugars before meals and at bedtime.  Diabetic teaching             CBG (last 3)  Recent Labs    04/29/20 2113 04/30/20 0616 04/30/20 1130  GLUCAP 124* 94 144*   9/24: still slightly elevated. RD education ordered.  -continue to monitor 10.  AKI on CKD stage III.  Creatinine baseline 2.42 08/02/2018.  Renal ultrasound 04/17/2019  showed no hydronephrosis or shadowing stone.  Started on sodium bicarbonate 1300 mg twice daily 04/17/2020.               Creatinine 3.19 on 9/22  EF reviewed-60-65%  IVF nightly x3 nights ordered on 9/22 (complete)  I suspect his baseline might now be closer to 3.0  -appreciate nephrology follow up 11.  Tobacco abuse.  Counsel 12.  Hyperlipidemia: Lipitor 13.  Slow transit constipation  Bowel meds increased again on 9/21  -last bm 9/22  -sorbitol today 14. Leukocytosis:   WBCs  11.4 on 9/22    Afebrile  Continue to monitor 15.  Acute blood loss anemia?  On chronic anemia  Hemoglobin 10.0 on 9/22 16.  Severe hypoalbuminemia  Supplement initiated on 9/21  LOS: 8 days A FACE TO [place] 04/30/2020, 12:18 PM

## 2020-04-30 NOTE — Progress Notes (Signed)
Occupational Therapy Session Note  Patient Details  Name: Tony Schmitt MRN: [id number] Date of Birth: 1971/08/06  Today's Date: 04/30/2020 OT Individual Time: 8280-0349 and 1791-5056 OT Individual Time Calculation (min): 42 min and 36 min  Skilled Therapeutic Interventions/Progress Updates:    Pt greeted in bed with no c/o pain, requesting to work on his Lt arm. While seated EOB, reviewed gentle stretches for the Lt hand to offset flexor synergies. Also discussed visualization techniques to use before going to bed at night to help with promoting functional return. Transitioned to NMR focus using UE ranger as pt exhibits proximal>distal activation, guided pt through exercises with gravity minimized given min facilitation, shoulder flexion/extension, shoulder internal/external rotation, and circles. At end of session he returned to bed and boosted himself up with bed placed in trendelenburg position. Education also provided regarding regarding edema mgt of his Lt hand (I.e. self ROM and positioning strategies). Elevated Lt UE using pillows at end of session. Pt remained in bed with all needs within reach and bed alarm set.   2nd Session 1:1 tx (36 min) Pt greeted in bed with no c/o pain. ADL needs met and agreeable to session. Worked on Lt UE NMR while sitting EOB via weightbearing, active assist ROM with gravity minimized/maximized, and light resistive activities. Pt requires verbal, manual, and tactile cues to decrease compensatory strategies of trunk. Tactile cues for minimizing shoulder hiking tendencies as well. No active movement noted in the hand yet though pt is developing proximal strength. At end of session pt returned to bed and was left with all needs within reach and bed alarm set.   Therapy Documentation Precautions:  Precautions Precautions: Fall, Other (comment) Precaution Comments: left hemiparesis Restrictions Weight Bearing Restrictions: Yes ADL: ADL Eating: Set up Grooming:  Setup Upper Body Bathing: Setup Where Assessed-Upper Body Bathing: Shower Lower Body Bathing: Contact guard Where Assessed-Lower Body Bathing: Shower Upper Body Dressing: Supervision/safety Where Assessed-Upper Body Dressing: Chair Lower Body Dressing: Moderate assistance Where Assessed-Lower Body Dressing: Chair Toileting: Moderate assistance Where Assessed-Toileting: Bedside Commode Toilet Transfer: Minimal assistance Toilet Transfer Method: Squat pivot, Stand pivot Toilet Transfer Equipment: Energy manager: Minimal assistance Social research officer, government Method: Radiographer, therapeutic: Radio broadcast assistant, Grab bars (simulated)      Therapy/Group: Individual Therapy  Calandria Mullings A Tayten Bergdoll 04/30/2020, 12:30 PM

## 2020-04-30 NOTE — Progress Notes (Signed)
Physical Therapy Session Note  Patient Details  Name: Tony Schmitt MRN: [id number] Date of Birth: [birth year]  Today's Date: 04/30/2020 PT Individual Time: 0730-0830 PT Individual Time Calculation (min): 60 min   Short Term Goals: Week 2:  PT Short Term Goal 1 (Week 2): Pt will perform sit<>stand transfers with CGA and LRAD PT Short Term Goal 2 (Week 2): Pt will perform bed<>chair transfers with CGA and LRAD PT Short Term Goal 3 (Week 2): Pt will ambulate at least 177ft with minA and LRAD PT Short Term Goal 4 (Week 2): Pt will ascend/descend 4 steps with modA and LRAD  Skilled Therapeutic Interventions/Progress Updates:    Min assist to facilitate bed mobility coming to the left at trunk for supine to sit and cues for technique. Supervision sitting balance except one LOB to L and posteriorly with poor righting reactions noted. PT assisted with donning of tedhose and shoes with max assist for time management. Min assist squat pivot to w/c with focus on technique and safety. Pt performed hygiene at sink at w/c level with set up assistance. W/c mobility on unit with hemi technique with supervision and cues for attention to the L and technique. Blocked practice NMR EOM with focus on sit to stand technique and knee control on L during toe taps with RLE onto 4" step (10 reps x 2 sets total). Pt with increased knee hyperextension noted today and min to mod assist overall for balance. NMR during gait with DF wrap on LLE x 25' with overall min assist and cues for knee control with ability to control hyperextension ~ 25% of the time. Improved management of RW noted today during gait. Nustep for BUE/BLE reciprocal movement pattern retraining on level 6 with gait belt around thighs to promote alignment of LLE (pt unable to maintain without external support) and adaptive hand piece on LUE due to hemiparesis x 5 min total. Min assist for transfers rest of session off of nustep and then back to bed. Min assist to  return to supine for LLE management back to bed.   Therapy Documentation Precautions:  Precautions Precautions: Fall, Other (comment) Precaution Comments: left hemiparesis Restrictions Weight Bearing Restrictions: Yes   Pain:  Denies pain.   Therapy/Group: Individual Therapy  Canary Brim Ivory Broad, PT, DPT, CBIS  04/30/2020, 9:05 AM

## 2020-05-01 LAB — GLUCOSE, CAPILLARY
Glucose-Capillary: 134 mg/dL — ABNORMAL HIGH (ref 70–99)
Glucose-Capillary: 132 mg/dL — ABNORMAL HIGH (ref 70–99)
Glucose-Capillary: 134 mg/dL — ABNORMAL HIGH (ref 70–99)
Glucose-Capillary: 113 mg/dL — ABNORMAL HIGH (ref 70–99)

## 2020-05-01 MED ORDER — ONDANSETRON HCL 4 MG/2ML IJ SOLN
4.0000 mg | Freq: Three times a day (TID) | INTRAMUSCULAR | Status: DC | PRN
  Administered 2020-05-01 – 2020-05-05 (×6): 4 mg via INTRAVENOUS
  Filled 2020-05-01 (×6): qty 2

## 2020-05-01 NOTE — Progress Notes (Signed)
[facility] PHYSICAL MEDICINE & REHABILITATION PROGRESS NOTE   Subjective/Complaints: Pt doesn't do well with sorbitol apparently. Helped him move his bowels but made him very nauseas/vomitted. Had to give iv zofran last night which finally helped stop vomiting overnight  ROS: Patient denies fever, rash, sore throat, blurred vision,   diarrhea, cough, shortness of breath or chest pain, joint or back pain, headache, or mood change.   Objective:   No results found. No results for input(s): WBC, HGB, HCT, PLT in the last 72 hours. Recent Labs    04/28/20 2322 04/29/20 1100  NA  --  141  K  --  4.4  CL  --  108  CO2  --  27  GLUCOSE  --  129*  BUN  --  37*  CREATININE 3.23* 3.25*  CALCIUM  --  9.4    Intake/Output Summary (Last 24 hours) at 05/01/2020 1200 Last data filed at 05/01/2020 0430 Gross per 24 hour  Intake --  Output 425 ml  Net -425 ml        Physical Exam: Vital Signs Blood pressure (!) 152/73, pulse (!) 103, temperature 99.1 F (37.3 C), resp. rate 18, height 5\' 4"  (1.626 m), weight 72.9 kg, SpO2 99 %. Constitutional: No distress . Vital signs reviewed. HEENT: EOMI, oral membranes moist Neck: supple Cardiovascular: RRR without murmur. No JVD    Respiratory/Chest: CTA Bilaterally without wheezes or rales. Normal effort    GI/Abdomen: BS +, non-tender, non-distended Ext: no clubbing, cyanosis, or edema Psych: pleasant and cooperative Skin: Clean and intact without signs of breakdown Neuro: Alert Motor: RUE/RLE: 4-4+ /5 proximal distal LUE: Shoulder abduction 3-/5, elbow flexion 3-/5, hand grip 0/5, unchanged Left lower extremity: Hip flexion 2+/5, knee extension 2+/5, ankle dorsiflexion 0 to tr/5,       Assessment/Plan: 1. Functional deficits secondary to RIght corona radiata infarct which require 3+ hours per day of interdisciplinary therapy in a comprehensive inpatient rehab setting.  Physiatrist is providing close team supervision and 24 hour  management of active medical problems listed below.  Physiatrist and rehab team continue to assess barriers to discharge/monitor patient progress toward functional and medical goals  Care Tool:  Bathing  Bathing activity did not occur:  (N/A) Body parts bathed by patient: Chest, Abdomen, Left arm, Front perineal area, Right upper leg, Left upper leg, Face, Buttocks, Left lower leg, Right lower leg, Right arm   Body parts bathed by helper: Right arm     Bathing assist Assist Level: Contact Guard/Touching assist (used long sponge)     Upper Body Dressing/Undressing Upper body dressing   What is the patient wearing?: Pull over shirt    Upper body assist Assist Level: Supervision/Verbal cueing    Lower Body Dressing/Undressing Lower body dressing      What is the patient wearing?: Pants, Underwear/pull up     Lower body assist Assist for lower body dressing: Minimal Assistance - Patient > 75%     Toileting Toileting    Toileting assist Assist for toileting: Supervision/Verbal cueing Assistive Device Comment:  (Urinal)   Transfers Chair/bed transfer  Transfers assist     Chair/bed transfer assist level: Minimal Assistance - Patient > 75%     Locomotion Ambulation   Ambulation assist      Assist level: Moderate Assistance - Patient 50 - 74% Assistive device: Walker-rolling Max distance: 30'   Walk 10 feet activity   Assist     Assist level: Minimal Assistance - Patient > 75%  Assistive device: Walker-rolling   Walk 50 feet activity   Assist Walk 50 feet with 2 turns activity did not occur: Safety/medical concerns         Walk 150 feet activity   Assist Walk 150 feet activity did not occur: Safety/medical concerns         Walk 10 feet on uneven surface  activity   Assist Walk 10 feet on uneven surfaces activity did not occur: Safety/medical concerns         Wheelchair     Assist Will patient use wheelchair at discharge?: No  (Per PT goals) Type of Wheelchair: Manual    Wheelchair assist level: Supervision/Verbal cueing Max wheelchair distance: 162ft    Wheelchair 50 feet with 2 turns activity    Assist        Assist Level: Supervision/Verbal cueing   Wheelchair 150 feet activity     Assist      Assist Level: Supervision/Verbal cueing   Blood pressure (!) 152/73, pulse (!) 103, temperature 99.1 F (37.3 C), resp. rate 18, height 5\' 4"  (1.626 m), weight 72.9 kg, SpO2 99 %.    Medical Problem List and Plan: 1.  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   Continue CIR  WHO/PRAFO nightly 2.  Antithrombotics: -DVT/anticoagulation: Lovenox             -antiplatelet therapy: Aspirin 81 mg daily 3. Pain Management: Tylenol as needed. Well controlled 4. Mood: Provide emotional support             -antipsychotic agents: N/A 5. Neuropsych: This patient is capable of making decisions on his own behalf. 6. Skin/Wound Care: Routine skin checks 7. Fluids/Electrolytes/Nutrition: Routine in and outs.   8.  Hypertension.    Lisinopril PTA, on hold due to AKI  Norvasc 10 mg daily  Lopressor 12.5 mg twice daily started on 9/21          Vitals:   04/30/20 2004 05/01/20 0339  BP: (!) 166/87 (!) 152/73  Pulse: 86 (!) 103  Resp: 16 18  Temp: 98.6 F (37 C) 99.1 F (37.3 C)  SpO2: 100% 99%   9/24: increased Lopressor to 50mg  BID   9/26: still some elevation but with recent N/V I would prefer to hold on any changes to avoid potential cerebral hypoperfusion 9.  Diabetes mellitus with hyperglycemia.  Hemoglobin A1c 9.6.  Check blood sugars before meals and at bedtime.  Diabetic teaching             CBG (last 3)  Recent Labs    04/30/20 2058 05/01/20 0638 05/01/20 1135  GLUCAP 187* 134* 132*   9/26: still slightly elevated but trending down  - RD education ordered.  10.  AKI on CKD  stage III.  Creatinine baseline 2.42 08/02/2018.  Renal ultrasound 04/17/2019 showed no hydronephrosis or shadowing stone.  Started on sodium bicarbonate 1300 mg twice daily 04/17/2020.               Creatinine 3.19 on 9/22  EF reviewed-60-65%  IVF nightly x3 nights ordered on 9/22 (complete)  I suspect his new baseline might now be closer to 3.0  -appreciate nephrology follow up  -recheck labs 9/27 11.  Tobacco abuse.  Counsel 12.  Hyperlipidemia: Lipitor 13.  Slow transit constipation  Bowel meds increased again on 9/21  -sorbitol effective but makes him nauseas---put on allergy list  -had large bm yesterday  -  miralax bid and senna-s at HS 14. Leukocytosis:   WBCs 11.4 on 9/22    Afebrile  Continue to monitor 15.  Acute blood loss anemia?  On chronic anemia  Hemoglobin 10.0 on 9/22  -recheck this coming week 16.  Severe hypoalbuminemia  Supplement initiated on 9/21  LOS: 9 days A FACE TO [place] 05/01/2020, 12:00 PM

## 2020-05-01 NOTE — Progress Notes (Signed)
Patient had loose and formed bowel movements accompanied by vomiting around midnight with c/o stomach discomfort. Patient thinks it's from the scheduled laxative. Patient was also having chills after he was brought back to bed from the bathroom. Patient provided with warm blanket and warm packs. Zofran IV given per md order. Vitals 99.1, 152/73, 103 and 99% room air. Patient currently sleeping.

## 2020-05-02 ENCOUNTER — Inpatient Hospital Stay (HOSPITAL_COMMUNITY): Payer: Self-pay | Admitting: Occupational Therapy

## 2020-05-02 ENCOUNTER — Inpatient Hospital Stay (HOSPITAL_COMMUNITY): Payer: Self-pay

## 2020-05-02 LAB — BASIC METABOLIC PANEL
Anion gap: 6 (ref 5–15)
BUN: 35 mg/dL — ABNORMAL HIGH (ref 6–20)
CO2: 25 mmol/L (ref 22–32)
Calcium: 9.1 mg/dL (ref 8.9–10.3)
Chloride: 109 mmol/L (ref 98–111)
Creatinine, Ser: 3.23 mg/dL — ABNORMAL HIGH (ref 0.61–1.24)
GFR calc Af Amer: 25 mL/min — ABNORMAL LOW (ref >60)
GFR calc non Af Amer: 21 mL/min — ABNORMAL LOW (ref >60)
Glucose, Bld: 147 mg/dL — ABNORMAL HIGH (ref 70–99)
Potassium: 3.8 mmol/L (ref 3.5–5.1)
Sodium: 140 mmol/L (ref 135–145)

## 2020-05-02 LAB — GLUCOSE, CAPILLARY
Glucose-Capillary: 98 mg/dL (ref 70–99)
Glucose-Capillary: 127 mg/dL — ABNORMAL HIGH (ref 70–99)
Glucose-Capillary: 131 mg/dL — ABNORMAL HIGH (ref 70–99)
Glucose-Capillary: 179 mg/dL — ABNORMAL HIGH (ref 70–99)

## 2020-05-02 NOTE — Progress Notes (Signed)
Occupational Therapy Session Note  Patient Details  Name: Tony Schmitt MRN: [id number] Date of Birth: October 20, 1970  Today's Date: 05/02/2020 OT Individual Time: 6754-4920 OT Individual Time Calculation (min): 40 min    Short Term Goals: Week 2:  OT Short Term Goal 1 (Week 2): Pt will be able to complete stand pivot transfer to toilet with CGA. OT Short Term Goal 2 (Week 2): Pt will be able to don shoes with min A. OT Short Term Goal 3 (Week 2): Pt will be able to lift shoulder to 90 degrees abduction during bathing. OT Short Term Goal 4 (Week 2): Pt will be able to stand at sink to complete grooming with CGA.  Skilled Therapeutic Interventions/Progress Updates:    Pt supine in bed, reporting "I feel terrible".  Pt c/o nausea and dizziness, however would like to try to take a shower.  Vitals taken: BP 134/80, pulse 72, O2 on RA 100%.  Pt complete supine to sit with min assist and VCs for body mechanics corrections.  Pt completed SPT using RW with min assist including manual positioning of LLE and RW mgt on left side.  Pt needed education on safe hand placement during stand to sit due to keeping both hands on RW entire time.  Pt retrieved ADL items at w/c level with supervision.  Pt reporting nausea worsening and opted for sinkside bathing instead.  Pt bathed UB with supervision and increased time due to frequent RBs needed secondary to nausea.  Pt requesting back to bed and to end session due to nausea.  Pt completed squat pivot transfer w/c to EOB with CGA and sit to supine with min assist.  Nurse made aware of pts symptoms.  Call bell in reach, bed alarm on.  Therapy Documentation Precautions:  Precautions Precautions: Fall, Other (comment) Precaution Comments: left hemiparesis Restrictions Weight Bearing Restrictions: Yes   Therapy/Group: Individual Therapy  [name] 05/02/2020, 12:07 PM

## 2020-05-02 NOTE — Progress Notes (Signed)
Physical Therapy Session Note  Patient Details  Name: Tony Schmitt MRN: [id number] Date of Birth: November 30, 1970  Today's Date: 05/02/2020 PT Individual Time: 1300-1340 PT Individual Time Calculation (min): 40 min   Short Term Goals: Week 2:  PT Short Term Goal 1 (Week 2): Pt will perform sit<>stand transfers with CGA and LRAD PT Short Term Goal 2 (Week 2): Pt will perform bed<>chair transfers with CGA and LRAD PT Short Term Goal 3 (Week 2): Pt will ambulate at least 174ft with minA and LRAD PT Short Term Goal 4 (Week 2): Pt will ascend/descend 4 steps with modA and LRAD  Skilled Therapeutic Interventions/Progress Updates:    Pt received sleeping supine in bed, agreeable to PT session with effort despite reported nausea. RN aware of nausea, however isn't yet due for medications. Performed supine there-ex as follows: -2x10 LLE quad set with therapist performing TC to engage quad -2x10 LLE SAQ with pillow bolster and therapist performing TC to engage quad -2x10 LLE hip abduction/adduction -2x10 bridges with therapist bracing LLE -5x30sec heel cord and hamstring stretches to LLE  Donned pants with modA for threading LLE. Supine<>sit with minA with HOB flat, using bed rail. Able to maintain sitting EOB with supervision and no BUE support. Performed sit<>stand with minA and RW from lowered EOB height and tolerated standing long enough to take a BP, requiring minA to maintain stand. Pt deferring further OOB or functional mobility training due to nausea/fatigue. BP's assessed and below. Pt missed 35 minutes of skilled therapy.  Supine: 133/76 (93) HR 75 Seated: 138/75 (94) HR 79 Standing: 141/99 (111) HR 82  Therapy Documentation Precautions:  Precautions Precautions: Fall, Other (comment) Precaution Comments: left hemiparesis Restrictions Weight Bearing Restrictions: Yes General: PT Amount of Missed Time (min): 35 Minutes PT Missed Treatment Reason: Patient fatigue;Patient ill (Comment)  (Nausea)  Therapy/Group: Individual Therapy  Dyllon Henken P Shatara Stanek PT 05/02/2020, 1:41 PM

## 2020-05-02 NOTE — Progress Notes (Signed)
Occupational Therapy Session Note  Patient Details  Name: Tony Schmitt MRN: [id number] Date of Birth: [birth year]  Today's Date: 05/02/2020 OT Individual Time: 0930-1010 OT Individual Time Calculation (min): 40 min    Short Term Goals: Week 2:  OT Short Term Goal 1 (Week 2): Pt will be able to complete stand pivot transfer to toilet with CGA. OT Short Term Goal 2 (Week 2): Pt will be able to don shoes with min A. OT Short Term Goal 3 (Week 2): Pt will be able to lift shoulder to 90 degrees abduction during bathing. OT Short Term Goal 4 (Week 2): Pt will be able to stand at sink to complete grooming with CGA.  Skilled Therapeutic Interventions/Progress Updates:    Pt asleep in bed upon arrival but easily aroused. Pt commented that he had a rough couple of days with BMs and n/v. Pt stated he hasn't been able to sleep.  Pt agreeable to LUE NMR at bed level. Focus on grasp, biceps, and shoulder flexion. Trace grasp and shoulder flexion.  Kinesio tape applied to L hand for edema management. Pt remained in bed with all needs within reach and bed alarm activated.   Therapy Documentation Precautions:  Precautions Precautions: Fall, Other (comment) Precaution Comments: left hemiparesis Restrictions Weight Bearing Restrictions: Yes  Pain: Pain Assessment Pain Scale: 0-10 Pain Score: 0-No pain  Therapy/Group: Individual Therapy  Leroy Libman 05/02/2020, 10:11 AM

## 2020-05-02 NOTE — Progress Notes (Signed)
[facility] PHYSICAL MEDICINE & REHABILITATION PROGRESS NOTE   Subjective/Complaints:   No issues overnite , no further nausea   ROS: Patient denies CP, SOB, N/V/D Objective:   No results found. No results for input(s): WBC, HGB, HCT, PLT in the last 72 hours. Recent Labs    04/29/20 1100 05/02/20 0700  NA 141 140  K 4.4 3.8  CL 108 109  CO2 27 25  GLUCOSE 129* 147*  BUN 37* 35*  CREATININE 3.25* 3.23*  CALCIUM 9.4 9.1    Intake/Output Summary (Last 24 hours) at 05/02/2020 0800 Last data filed at 05/01/2020 2011 Gross per 24 hour  Intake 190 ml  Output 600 ml  Net -410 ml        Physical Exam: Vital Signs Blood pressure (!) 148/80, pulse 84, temperature 97.9 F (36.6 C), resp. rate 16, height 5\' 4"  (1.626 m), weight 72.9 kg, SpO2 99 %.  General: No acute distress Mood and affect are appropriate Heart: Regular rate and rhythm no rubs murmurs or extra sounds Lungs: Clear to auscultation, breathing unlabored, no rales or wheezes Abdomen: Positive bowel sounds, soft nontender to palpation, nondistended Extremities: No clubbing, cyanosis, or edema Skin: No evidence of breakdown, no evidence of rash   Motor: RUE/RLE: 4-4+ /5 proximal distal LUE: Shoulder abduction 3-/5, elbow flexion 3-/5, hand grip 0/5, unchanged, 2- L shoulder add Left lower extremity: Hip flexion 2+/5, knee extension 2+/5, ankle dorsiflexion 0 to tr/5,       Assessment/Plan: 1. Functional deficits secondary to RIght corona radiata infarct which require 3+ hours per day of interdisciplinary therapy in a comprehensive inpatient rehab setting.  Physiatrist is providing close team supervision and 24 hour management of active medical problems listed below.  Physiatrist and rehab team continue to assess barriers to discharge/monitor patient progress toward functional and medical goals  Care Tool:  Bathing  Bathing activity did not occur:  (N/A) Body parts bathed by patient: Chest, Abdomen,  Left arm, Front perineal area, Right upper leg, Left upper leg, Face, Buttocks, Left lower leg, Right lower leg, Right arm   Body parts bathed by helper: Right arm     Bathing assist Assist Level: Contact Guard/Touching assist (used long sponge)     Upper Body Dressing/Undressing Upper body dressing   What is the patient wearing?: Pull over shirt    Upper body assist Assist Level: Supervision/Verbal cueing    Lower Body Dressing/Undressing Lower body dressing      What is the patient wearing?: Pants, Underwear/pull up     Lower body assist Assist for lower body dressing: Minimal Assistance - Patient > 75%     Toileting Toileting    Toileting assist Assist for toileting: Supervision/Verbal cueing Assistive Device Comment:  (Urinal)   Transfers Chair/bed transfer  Transfers assist     Chair/bed transfer assist level: Minimal Assistance - Patient > 75%     Locomotion Ambulation   Ambulation assist      Assist level: Moderate Assistance - Patient 50 - 74% Assistive device: Walker-rolling Max distance: 30'   Walk 10 feet activity   Assist     Assist level: Minimal Assistance - Patient > 75% Assistive device: Walker-rolling   Walk 50 feet activity   Assist Walk 50 feet with 2 turns activity did not occur: Safety/medical concerns         Walk 150 feet activity   Assist Walk 150 feet activity did not occur: Safety/medical concerns  Walk 10 feet on uneven surface  activity   Assist Walk 10 feet on uneven surfaces activity did not occur: Safety/medical concerns         Wheelchair     Assist Will patient use wheelchair at discharge?: No (Per PT goals) Type of Wheelchair: Manual    Wheelchair assist level: Supervision/Verbal cueing Max wheelchair distance: 177ft    Wheelchair 50 feet with 2 turns activity    Assist        Assist Level: Supervision/Verbal cueing   Wheelchair 150 feet activity     Assist       Assist Level: Supervision/Verbal cueing   Blood pressure (!) 148/80, pulse 84, temperature 97.9 F (36.6 C), resp. rate 16, height 5\' 4"  (1.626 m), weight 72.9 kg, SpO2 99 %.    Medical Problem List and Plan: 1.  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   Continue CIR  WHO/PRAFO nightly 2.  Antithrombotics: -DVT/anticoagulation: Lovenox             -antiplatelet therapy: Aspirin 81 mg daily 3. Pain Management: Tylenol as needed. Well controlled 4. Mood: Provide emotional support             -antipsychotic agents: N/A 5. Neuropsych: This patient is capable of making decisions on his own behalf. 6. Skin/Wound Care: Routine skin checks 7. Fluids/Electrolytes/Nutrition: Routine in and outs.   8.  Hypertension.    Lisinopril PTA, on hold due to AKI  Norvasc 10 mg daily  Lopressor 12.5 mg twice daily started on 9/21          Vitals:   05/01/20 1919 05/02/20 0603  BP: (!) 158/80 (!) 148/80  Pulse: 87 84  Resp: 19 16  Temp: 97.8 F (36.6 C) 97.9 F (36.6 C)  SpO2: 100% 99%   9/24: increased Lopressor to 50mg  BID   Cont current dose to allow equilibration, may need furhter adjustment, HR should tolerate 9.  Diabetes mellitus with hyperglycemia.  Hemoglobin A1c 9.6.  Check blood sugars before meals and at bedtime.  Diabetic teaching             CBG (last 3)  Recent Labs    05/01/20 1629 05/01/20 2121 05/02/20 0601  GLUCAP 134* 113* 98   Well controlled 9/27  10.  AKI on CKD stage III.  Creatinine baseline 2.42 08/02/2018.  Renal ultrasound 04/17/2019 showed no hydronephrosis or shadowing stone.  Started on sodium bicarbonate 1300 mg twice daily 04/17/2020.               Creatinine 3.19 on 9/22  EF reviewed-60-65%  IVF nightly x3 nights ordered on 9/22 (complete)  I suspect his new baseline might now be closer to 3.0  -appreciate nephrology follow up  -recheck labs  9/27- Creat still above baseline , still holding lisinopril  11.  Tobacco abuse.  Counsel 12.  Hyperlipidemia: Lipitor 13.  Slow transit constipation  Good results with sorbitol although it did cause nausea  14. Leukocytosis:   WBCs 11.4 on 9/22    Afebrile  Continue to monitor 15.  Acute blood loss anemia?  On chronic anemia  Hemoglobin 10.0 on 9/22   16.  Severe hypoalbuminemia  Supplement initiated on 9/21  LOS: 10 days A FACE TO FACE EVALUATION WAS PERFORMED  Charlett Blake 05/02/2020, 8:00 AM

## 2020-05-02 NOTE — Progress Notes (Signed)
Occupational Therapy Session Note  Patient Details  Name: Tony Schmitt MRN: [id number] Date of Birth: 1971-07-29  Today's Date: 05/02/2020 OT Individual Time: 1103-1594 OT Individual Time Calculation (min): 26 min    Short Term Goals: Week 2:  OT Short Term Goal 1 (Week 2): Pt will be able to complete stand pivot transfer to toilet with CGA. OT Short Term Goal 2 (Week 2): Pt will be able to don shoes with min A. OT Short Term Goal 3 (Week 2): Pt will be able to lift shoulder to 90 degrees abduction during bathing. OT Short Term Goal 4 (Week 2): Pt will be able to stand at sink to complete grooming with CGA.  Skilled Therapeutic Interventions/Progress Updates:    Pt received supine, stating he is feeling much better from earlier in the day. He completed bed mobility with min A to transition from sidelying to sitting. Pt completed stand pivot transfer toward his R side with min A. Min cueing for technique. Pt completed oral care at the sink with set up assist. In the dayroom pt completed 3x LUE facilitated weightbearing through extended arm while RUE reached across midline. Max facilitation required to maintain extension. During rest breaks pt completed self PROM with demo provided and cueing for proper technique. Pt returned to his w/c and to his room. He was left sitting up with all needs met, belt alarm set.   Therapy Documentation Precautions:  Precautions Precautions: Fall, Other (comment) Precaution Comments: left hemiparesis Restrictions Weight Bearing Restrictions: Yes   Therapy/Group: Individual Therapy  Curtis Sites 05/02/2020, 12:53 PM

## 2020-05-03 ENCOUNTER — Inpatient Hospital Stay (HOSPITAL_COMMUNITY): Payer: Self-pay | Admitting: Physical Therapy

## 2020-05-03 ENCOUNTER — Inpatient Hospital Stay (HOSPITAL_COMMUNITY): Payer: Self-pay | Admitting: Occupational Therapy

## 2020-05-03 ENCOUNTER — Encounter (HOSPITAL_COMMUNITY): Payer: Self-pay | Admitting: Psychology

## 2020-05-03 ENCOUNTER — Inpatient Hospital Stay (HOSPITAL_COMMUNITY): Payer: Self-pay

## 2020-05-03 LAB — RENAL FUNCTION PANEL
Albumin: 1.2 g/dL — ABNORMAL LOW (ref 3.5–5.0)
Anion gap: 7 (ref 5–15)
BUN: 31 mg/dL — ABNORMAL HIGH (ref 6–20)
CO2: 27 mmol/L (ref 22–32)
Calcium: 9.1 mg/dL (ref 8.9–10.3)
Chloride: 104 mmol/L (ref 98–111)
Creatinine, Ser: 3.26 mg/dL — ABNORMAL HIGH (ref 0.61–1.24)
GFR calc Af Amer: 24 mL/min — ABNORMAL LOW
GFR calc non Af Amer: 21 mL/min — ABNORMAL LOW
Glucose, Bld: 124 mg/dL — ABNORMAL HIGH (ref 70–99)
Phosphorus: 3.2 mg/dL (ref 2.5–4.6)
Potassium: 4 mmol/L (ref 3.5–5.1)
Sodium: 138 mmol/L (ref 135–145)

## 2020-05-03 LAB — GLUCOSE, CAPILLARY
Glucose-Capillary: 91 mg/dL (ref 70–99)
Glucose-Capillary: 92 mg/dL (ref 70–99)
Glucose-Capillary: 120 mg/dL — ABNORMAL HIGH (ref 70–99)
Glucose-Capillary: 127 mg/dL — ABNORMAL HIGH (ref 70–99)

## 2020-05-03 NOTE — Progress Notes (Signed)
Physical Therapy Session Note  Patient Details  Name: Tony Schmitt MRN: [id number] Date of Birth: Jul 10, 1971  Today's Date: 05/03/2020 PT Individual Time: 0910-1007 PT Individual Time Calculation (min): 57 min   Short Term Goals: Week 2:  PT Short Term Goal 1 (Week 2): Pt will perform sit<>stand transfers with CGA and LRAD PT Short Term Goal 2 (Week 2): Pt will perform bed<>chair transfers with CGA and LRAD PT Short Term Goal 3 (Week 2): Pt will ambulate at least 112ft with minA and LRAD PT Short Term Goal 4 (Week 2): Pt will ascend/descend 4 steps with modA and LRAD  Skilled Therapeutic Interventions/Progress Updates:    Pt received supine in bed and reports he is feeling much better today and is agreeable to therapy session. Pt reports that since Sunday 05/01/2020 he has been having increased dizziness that he associates with frequent BMs - pt reports he is consuming enough water (2-3cups a day) with therapist educating on need for increased intake. Supine>sitting R EOB, HOB partially elevated and relying heavily on bedrail, with close supervision for safety. Sitting EOB donned B LE TEDs, socks, and shoes max assist for time management. Still no trace movement noted in L LE ankle PF/DF. L stand pivot EOB>w/c with R UE support on bedrail and min assist for balance - cuing and manual faiclitation for L LE alignment when stepping. Transported to/from gym in w/c for time management and energy conservation. Donned L LE ankle DF assist ACE wrap with eversion bias. Gait training ~69ft, including 1 turn, using RW with min/mod assist for balance - demos L knee hyperextension during stance phase, L ACE wrap noted to cause excessive L LE external rotation throughout - pt able to advance L LE during swing but predominently utilizing hip adductors with lack of active hip/knee flexion noted. L stand pivot w/c>EOM, min assist for balance and continued manual facilitation for improved L LE alignment. Repeated  sit<>stands to/from EOM, no UE support, with min assist for balance x15 reps - cuing for anterior trunk lean and forward weight shift and to decrease pushing backs of legs on mat - pt has difficulty finding balance once in standing due to impaired L LE plantarflexion activation and impaired motor control/muscle activation in L hip muscles. L LE NMR targeting stance phase of gait via R LE foot taps on/off 8" step  - mirror feedback and mod assist for balance with manual facilitation for improved L LE alignment to increase hip extensor and abductor activation and tactile cuing for knee alignment. L LE NMR targeting swing phase of gait via L foot taps on/off 8" step without UE support - mirror feedback - required min assist for balance and total assist for L LE placement on/off step with noticed impaired motor planning/muscle recruitment firing quads as opposed to hamstrings. Transitioned to standing L LE hamstring curls with external target and mirror feedback - therapist continuing to provide total assist for L LE management with pt having slight improvement in motor planning with decreased quad activation. Therapist educated pt on performing L LE heel slides in sitting and supine outside of therapy session to promote improved hamstring motor control - educated pt not to perform supine straight leg raises as those provide improper motor recruitment firing hip flexors with quads that leads to impaired swing phase gait mechanics. R stand pivot to w/c with min assist as described above. Transported back to room and left seated in w/c with needs in reach, seat belt alarm on, and washcloth  under L foot performing seated heel slides with pt noticed to be able to move through minimal ROM.  Therapy Documentation Precautions:  Precautions Precautions: Fall, Other (comment) Precaution Comments: left hemiparesis Restrictions Weight Bearing Restrictions: Yes  Pain: No reports of pain throughout  session.  Therapy/Group: Individual Therapy  Tawana Scale , PT, DPT, CSRS  05/03/2020, 7:54 AM

## 2020-05-03 NOTE — Progress Notes (Signed)
Occupational Therapy Session Note  Patient Details  Name: Tony Schmitt MRN: [id number] Date of Birth: 1971-02-26  Today's Date: 05/03/2020 OT Individual Time: 4315-4008 OT Individual Time Calculation (min): 73 min    Short Term Goals: Week 2:  OT Short Term Goal 1 (Week 2): Pt will be able to complete stand pivot transfer to toilet with CGA. OT Short Term Goal 2 (Week 2): Pt will be able to don shoes with min A. OT Short Term Goal 3 (Week 2): Pt will be able to lift shoulder to 90 degrees abduction during bathing. OT Short Term Goal 4 (Week 2): Pt will be able to stand at sink to complete grooming with CGA.  Skilled Therapeutic Interventions/Progress Updates:    Pt supine in bed reporting feeling much better today and agreeable to OT session.  Supine to sit with CGA, SPT to w/c with min assist.  Pt retrieved ADL items with supervision and transported to bathroom.  CGA SPT w/c to TTB using grab bar.  Pt bathed UB and LB with CGA using long handled sponge as needed.  Pt donned boxers with min assist and returned to w/c with CGA using SPT.  Pt donned shirt with increased time and supervision. Oral care completed with setup.  Pt educated on use of reacher to increase independence donning briefs and pants and also doffing socks.  Pt needing increased time and min assist for return demonstration.  Improved participation today due to decreased nausea.  Pt needs reinforcement with use of reach to increase independence with LB dressing.  Pt requesting back to bed, SPT with min assist.  Call bell in reach, bed alarm on.      Therapy Documentation Precautions:  Precautions Precautions: Fall, Other (comment) Precaution Comments: left hemiparesis Restrictions Weight Bearing Restrictions: Yes   Therapy/Group: Individual Therapy  [name] 05/03/2020, 4:12 PM

## 2020-05-03 NOTE — Progress Notes (Signed)
[facility] PHYSICAL MEDICINE & REHABILITATION PROGRESS NOTE   Subjective/Complaints: Patient seen laying in bed this morning.  He states he slept well overnight.  He notes he wore his orthoses overnight.  He had some nausea overnight and thought it was secondary to his medications.  Resolved this AM.  ROS: Denies CP, SOB, N/V/D  Objective:   No results found. No results for input(s): WBC, HGB, HCT, PLT in the last 72 hours. Recent Labs    05/02/20 0700  NA 140  K 3.8  CL 109  CO2 25  GLUCOSE 147*  BUN 35*  CREATININE 3.23*  CALCIUM 9.1    Intake/Output Summary (Last 24 hours) at 05/03/2020 1428 Last data filed at 05/03/2020 1300 Gross per 24 hour  Intake 680 ml  Output 1000 ml  Net -320 ml        Physical Exam: Vital Signs Blood pressure 136/82, pulse 73, temperature 98.8 F (37.1 C), resp. rate 18, height 5\' 4"  (1.626 m), weight 72.9 kg, SpO2 100 %. Constitutional: No distress . Vital signs reviewed. HENT: Normocephalic.  Atraumatic. Eyes: EOMI. No discharge. Cardiovascular: No JVD.  RRR. Respiratory: Normal effort.  No stridor.  Bilateral clear to auscultation. GI: Non-distended.  BS +. Skin: Warm and dry.  Intact. Psych: Normal mood.  Normal behavior. Musc: No edema in extremities.  No tenderness in extremities. Neuro: Alert Motor: RUE/RLE: 4-4+ /5 proximal distal LUE: 0/5 proximal to distal Left lower extremity: Hip flexion, knee extension 3/5, ankle dorsiflexion 0/5  Assessment/Plan: 1. Functional deficits secondary to RIght corona radiata infarct which require 3+ hours per day of interdisciplinary therapy in a comprehensive inpatient rehab setting.  Physiatrist is providing close team supervision and 24 hour management of active medical problems listed below.  Physiatrist and rehab team continue to assess barriers to discharge/monitor patient progress toward functional and medical goals  Care Tool:  Bathing  Bathing activity did not occur:   (N/A) Body parts bathed by patient: Chest, Abdomen, Left arm, Face, Right arm   Body parts bathed by helper: Right arm     Bathing assist Assist Level: Set up assist     Upper Body Dressing/Undressing Upper body dressing   What is the patient wearing?: Pull over shirt    Upper body assist Assist Level: Minimal Assistance - Patient > 75%    Lower Body Dressing/Undressing Lower body dressing      What is the patient wearing?: Pants, Underwear/pull up     Lower body assist Assist for lower body dressing: Minimal Assistance - Patient > 75%     Toileting Toileting    Toileting assist Assist for toileting: Supervision/Verbal cueing Assistive Device Comment:  (Urinal)   Transfers Chair/bed transfer  Transfers assist     Chair/bed transfer assist level: Minimal Assistance - Patient > 75%     Locomotion Ambulation   Ambulation assist      Assist level: Minimal Assistance - Patient > 75% Assistive device: Walker-rolling Max distance: 50   Walk 10 feet activity   Assist     Assist level: Minimal Assistance - Patient > 75% Assistive device: Walker-rolling   Walk 50 feet activity   Assist Walk 50 feet with 2 turns activity did not occur: Safety/medical concerns  Assist level: Minimal Assistance - Patient > 75% Assistive device: Walker-rolling    Walk 150 feet activity   Assist Walk 150 feet activity did not occur: Safety/medical concerns         Walk 10 feet on uneven  surface  activity   Assist Walk 10 feet on uneven surfaces activity did not occur: Safety/medical concerns         Wheelchair     Assist Will patient use wheelchair at discharge?: No (Per PT goals) Type of Wheelchair: Manual    Wheelchair assist level: Supervision/Verbal cueing Max wheelchair distance: 172ft    Wheelchair 50 feet with 2 turns activity    Assist        Assist Level: Supervision/Verbal cueing   Wheelchair 150 feet activity      Assist      Assist Level: Supervision/Verbal cueing   Blood pressure 136/82, pulse 73, temperature 98.8 F (37.1 C), resp. rate 18, height 5\' 4"  (1.626 m), weight 72.9 kg, SpO2 100 %.    Medical Problem List and Plan: 1.  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   Continue CIR  WHO/PRAFO nightly 2.  Antithrombotics: -DVT/anticoagulation: Lovenox             -antiplatelet therapy: Aspirin 81 mg daily 3. Pain Management: Tylenol as needed.  4. Mood: Provide emotional support             -antipsychotic agents: N/A 5. Neuropsych: This patient is capable of making decisions on his own behalf. 6. Skin/Wound Care: Routine skin checks 7. Fluids/Electrolytes/Nutrition: Routine in and outs.   8.  Hypertension.    Lisinopril PTA, on hold due to AKI  Norvasc 10 mg daily  Lopressor 12.5 mg twice daily started on 9/21, increased to 50 twice daily on 9/24          Vitals:   05/03/20 0317 05/03/20 1303  BP: (!) 150/77 136/82  Pulse: 80 73  Resp: 18 18  Temp: 98.3 F (36.8 C) 98.8 F (37.1 C)  SpO2: 100% 100%   Slightly labile on 9/28, will consider further medication adjustments if persistently elevated 9.  Diabetes mellitus with hyperglycemia.  Hemoglobin A1c 9.6.  Check blood sugars before meals and at bedtime.  Diabetic teaching             CBG (last 3)  Recent Labs    05/02/20 2116 05/03/20 0637 05/03/20 1211  GLUCAP 179* 91 92   Slightly labile on 9/28 10.  AKI on CKD stage III.  Creatinine baseline 2.42 08/02/2018.  Renal ultrasound 04/17/2019 showed no hydronephrosis or shadowing stone.  Started on sodium bicarbonate 1300 mg twice daily 04/17/2020.    Creatinine 3.23 on 9/27  EF reviewed-60-65%  IVF nightly x3 nights ordered on 9/22 (complete)  -appreciate nephrology follow up 11.  Tobacco abuse.  Counsel 12.  Hyperlipidemia: Lipitor 13.  Slow transit  constipation  Improving 14. Leukocytosis:   WBCs 11.4 on 9/22, labs ordered for tomorrow  Afebrile  Continue to monitor 15.  Acute blood loss anemia?  On chronic anemia  Hemoglobin 10.0 on 9/22, labs ordered for tomorrow 16.  Severe hypoalbuminemia  Supplement initiated on 9/21  LOS: 11 days A FACE TO FACE EVALUATION WAS PERFORMED  Layloni Fahrner [name] 05/03/2020, 2:28 PM

## 2020-05-03 NOTE — Consult Note (Addendum)
Neuropsychological Consultation   Patient:   Tony Schmitt   DOB:   1970/09/26  MR Number:  [id number]  Location:  [facility] [id number] [place] [address] Dept: [place]: [phone]           Date of Service:   05/03/2020  Start Time:   3 PM End Time:   4 PM  Provider/Observer:  Ilean Skill, Psy.D.       Clinical Neuropsychologist       Billing Code/Service: 48546  Chief Complaint:    Tony Schmitt is a 49 year old male with history of diabetes, tobacco abuse, chronic kidney disease stage III, hypertension as well as medical noncompliance.  Patient admits to not being consistent with his blood pressure medicines in particular.  Patient presented on 04/15/2020 with left hemiparesis.  Blood pressure was noted to be 176/109.  CT/MRI as well as MRI of the head neck showed acute subacute nonhemorrhagic posterior right corona radiata and anterior aspect of the left external capsule infarct.  Punctuate cortical infarct along inferior left frontal operculum.  Patient did not receive TPA.  Patient was assessed for residual motor and cognitive deficits and was admitted to the comprehensive rehabilitation program for ongoing therapies due to difficulties with ADLs and functional decline.  Reason for Service:  Patient was referred for neuropsychological consultation due to coping and adjustment issues following recent CVA in his extended hospital stay with the comprehensive inpatient rehabilitation program.  Below is the HPI for the current admission.  HPI: Tony Schmitt is a 49 year old right-handed male with history of diabetes mellitus, tobacco abuse, CKD stage III with creatinine baseline 2.42 08/02/2018, hypertension as well as medical noncompliance.  History taken from chart review and patient.  Patient lives with his mother.  1 level home 2 steps to entry.  Independent prior to  admission.  He presented on 04/15/2020 with left hemiparesis.  Blood pressure noted to be 176/109 CT/MRI as well as MRA of the head and neck showed acute subacute nonhemorrhagic posterior right corona radiata and anterior aspect of left external capsule infarct.  Punctate cortical infarct along the inferior left frontal operculum.  No flow signal in the distal left vertebral artery above the craniocervical junction.  Atherosclerotic irregularity of the cavernous internal carotid arteries bilaterally with 50-60% stenosis of the supraclinoid right ICA.  Carotid Dopplers with 1 to 39% ICA stenosis.  Patient did not receive TPA.  Admission chemistries alcohol negative, urine drug screen negative, creatinine 3.35, glucose 227, BUN 31, hemoglobin A1c 9.6.  Echocardiogram with ejection fraction of 60-65%.  No wall motion abnormalities.  Currently maintained on aspirin for CVA prophylaxis.  Subcutaneous Lovenox for DVT prophylaxis with lower extremity Dopplers negative for DVT.  TEE showed ejection fraction 65%, no evidence of thrombus possible very small PFO.  TCD study was completed negative for PFO.  No plans for loop recorder anymore.  Monitoring of renal function creatinine 3.10 maintained on IV fluids initially.  Tolerating a regular diet.  Therapy evaluations completed and patient was admitted for a comprehensive rehab program.  Please see preadmission assessment from earlier today as well.  Current Status:  Upon entering the room, the patient was alert and oriented but laying back in his bed.  He was watching TV and was able to immediately turn the TV off and be aware of social cues.  The patient appeared to have good mental status and orientation.  He acknowledged his inconsistency in blood pressure medicines in particular and not always following recommendations for diet and physical activity.  The patient reports that there is a family history of stroke with his father in particular.  The patient reports that in  general he is coping fairly well with extended hospital stay and understands the need and benefit from him participating in this rehab program.  The patient acknowledges stress being in the hospital for extended time in the acute changes in functioning.  Patient reports that he had significantly lost motivation and activity levels after losing his job due to COVID-19 issues and has continued with financial difficulties and struggled with adapting to the stressors he has been under.  This is led to some of his noncompliance and lack of effort in maintaining his overall medical status.  Behavioral Observation: Tony Schmitt  presents as a 49 y.o.-year-old Right African American Male who appeared his stated age. his dress was Appropriate and he was Well Groomed and his manners were Appropriate to the situation.  his participation was indicative of Appropriate and Attentive behaviors.  There were any physical disabilities noted.  he displayed an appropriate level of cooperation and motivation.     Interactions:    Active Appropriate and Attentive  Attention:   normal and attention span and concentration were age appropriate  Memory:   within normal limits; recent and remote memory intact  Visuo-spatial:  not examined  Speech (Volume):  normal  Speech:   normal; normal  Thought Process:  Coherent and Relevant  Though Content:  WNL; not suicidal and not homicidal  Orientation:   person, place, time/date and situation  Judgment:   Fair  Planning:   Fair  Affect:    Appropriate  Mood:    Dysphoric  Insight:   Fair  Intelligence:   normal  Medical History:   Past Medical History:  Diagnosis Date  . Diabetes mellitus without complication ([place])   . Hypertension    Psychiatric History:  Patient has no prior psychiatric history but does acknowledge frustration and coping difficulties after losing his job during the pandemic and difficulty finding adequate employment and financial  stability.  Family Med/Psych History:  Family History  Problem Relation Age of Onset  . Diabetes Mother   . Diabetes Father   . Hypertension Father   . Cancer Father   . Heart failure Other      Impression/DX:  Tony Part. [name] is a 49 year old male with history of diabetes, tobacco abuse, chronic kidney disease stage III, hypertension as well as medical noncompliance.  Patient admits to not being consistent with his blood pressure medicines in particular.  Patient presented on 04/15/2020 with left hemiparesis.  Blood pressure was noted to be 176/109.  CT/MRI as well as MRI of the head neck showed acute subacute nonhemorrhagic posterior right corona radiata and anterior aspect of the left external capsule infarct.  Punctuate cortical infarct along inferior left frontal operculum.  Patient did not receive TPA.  Patient was assessed for residual motor and cognitive deficits and was admitted to the comprehensive rehabilitation program for ongoing therapies due to difficulties with ADLs and functional decline.  Upon entering the room, the patient was alert and oriented but laying back in his bed.  He was watching TV and was able to immediately turn the TV off and be aware of social cues.  The patient appeared to have good mental status and orientation.  He acknowledged his inconsistency in blood pressure  medicines in particular and not always following recommendations for diet and physical activity.  The patient reports that there is a family history of stroke with his father in particular.  The patient reports that in general he is coping fairly well with extended hospital stay and understands the need and benefit from him participating in this rehab program.  The patient acknowledges stress being in the hospital for extended time in the acute changes in functioning.  Patient reports that he had significantly lost motivation and activity levels after losing his job due to COVID-19 issues and has continued  with financial difficulties and struggled with adapting to the stressors he has been under.  This is led to some of his noncompliance and lack of effort in maintaining his overall medical status.  Disposition/Plan:  Today we worked on coping and adjustment issues for dealing with complications following a CVA.  The patient denies any significant or severe depressive response but is rather dysphoric about his current situation.  I will follow up with the patient again later if needed.  Diagnosis:    Cerebrovascular accident (CVA) due to thrombosis of precerebral artery ([place]) - Plan: Ambulatory referral to Neurology, Ambulatory referral to Nephrology  AKI (acute kidney injury) ([place]) - Plan: US RENAL, US RENAL         Electronically Signed   _______________________ Ilean Skill, Psy.D.

## 2020-05-03 NOTE — Progress Notes (Signed)
Physical Therapy Session Note  Patient Details  Name: Tony Schmitt MRN: [id number] Date of Birth: 10-27-1970   Today's Date: 05/03/2020 PT Individual Time: 0254-2706 PT Individual Time Calculation (min): 70 min   Short Term Goals: Week 2:  PT Short Term Goal 1 (Week 2): Pt will perform sit<>stand transfers with CGA and LRAD PT Short Term Goal 2 (Week 2): Pt will perform bed<>chair transfers with CGA and LRAD PT Short Term Goal 3 (Week 2): Pt will ambulate at least 142ft with minA and LRAD PT Short Term Goal 4 (Week 2): Pt will ascend/descend 4 steps with modA and LRAD  Skilled Therapeutic Interventions/Progress Updates:    Pt received sitting in w/c, agreeable to PT session; denies nausea or pain. WC transport for time management to main therapy gym where focus of session was gait training. Performed interval gait training of 31ft x5 bouts (seated rest breaks) with RW and minA (a few occurrences of modA due to LOB). 1st interval of gait without AFO and pt demo's significant L knee hyperextension in terminal stance with inability to correct with cues. Remaining 4 trials used Walk-on trimmable AFO with medial strut to LLE which appeared to improve knee hyperextension and pt reports feeling more stability as well. Therapist guiding gait with assist for RW management and lateral weight shift. Pt continues to have difficulty with L foot placement, inconsistent L step length, and intermittent L toe drag with gait. Gait deficits worsened towards end of session due to fatigue from intervals. WC transport back to his room for time management, stand<>pivot with minA from w/c to EOB without AD, sit>supine with minA for LLE management and trunk control. Supine scoot with supervision and HOB flat, using RUE to bedrail to assist. Pt ended session with 3/4 bed rails up, needs in reach, bed alarm on.  Therapy Documentation Precautions:  Precautions Precautions: Fall, Other (comment) Precaution Comments: left  hemiparesis Restrictions Weight Bearing Restrictions: Yes  Therapy/Group: Individual Therapy  Sherrine Salberg P Ma Munoz PT 05/03/2020, 11:59 AM

## 2020-05-04 ENCOUNTER — Inpatient Hospital Stay (HOSPITAL_COMMUNITY): Payer: Self-pay | Admitting: Occupational Therapy

## 2020-05-04 ENCOUNTER — Inpatient Hospital Stay (HOSPITAL_COMMUNITY): Payer: Self-pay

## 2020-05-04 DIAGNOSIS — I10 Essential (primary) hypertension: Secondary | ICD-10-CM

## 2020-05-04 LAB — CBC WITH DIFFERENTIAL/PLATELET
Abs Immature Granulocytes: 0.05 10*3/uL (ref 0.00–0.07)
Basophils Absolute: 0.1 10*3/uL (ref 0.0–0.1)
Basophils Relative: 1 %
Eosinophils Absolute: 0.3 10*3/uL (ref 0.0–0.5)
Eosinophils Relative: 3 %
HCT: 30.3 % — ABNORMAL LOW (ref 39.0–52.0)
Hemoglobin: 9.4 g/dL — ABNORMAL LOW (ref 13.0–17.0)
Immature Granulocytes: 1 %
Lymphocytes Relative: 23 %
Lymphs Abs: 2.3 10*3/uL (ref 0.7–4.0)
MCH: 26.6 pg (ref 26.0–34.0)
MCHC: 31 g/dL (ref 30.0–36.0)
MCV: 85.8 fL (ref 80.0–100.0)
Monocytes Absolute: 0.6 10*3/uL (ref 0.1–1.0)
Monocytes Relative: 6 %
Neutro Abs: 6.8 10*3/uL (ref 1.7–7.7)
Neutrophils Relative %: 66 %
Platelets: 556 10*3/uL — ABNORMAL HIGH (ref 150–400)
RBC: 3.53 MIL/uL — ABNORMAL LOW (ref 4.22–5.81)
RDW: 13.5 % (ref 11.5–15.5)
WBC: 10 10*3/uL (ref 4.0–10.5)
nRBC: 0 % (ref 0.0–0.2)

## 2020-05-04 LAB — GLUCOSE, CAPILLARY
Glucose-Capillary: 87 mg/dL (ref 70–99)
Glucose-Capillary: 89 mg/dL (ref 70–99)
Glucose-Capillary: 104 mg/dL — ABNORMAL HIGH (ref 70–99)
Glucose-Capillary: 126 mg/dL — ABNORMAL HIGH (ref 70–99)
Glucose-Capillary: 141 mg/dL — ABNORMAL HIGH (ref 70–99)

## 2020-05-04 MED ORDER — METOPROLOL TARTRATE 50 MG PO TABS
75.0000 mg | ORAL_TABLET | Freq: Two times a day (BID) | ORAL | Status: DC
  Administered 2020-05-04 – 2020-05-07 (×6): 75 mg via ORAL
  Filled 2020-05-04 (×6): qty 1

## 2020-05-04 NOTE — Progress Notes (Addendum)
Physical Therapy Session Note  Patient Details  Name: Tony Schmitt [employer] MRN: [id number] Date of Birth: 08-27-1970  Today's Date: 05/04/2020 PT Individual Time: 0800-0900 PT Individual Time Calculation (min): 60 min   Short Term Goals: Week 2:  PT Short Term Goal 1 (Week 2): Pt will perform sit<>stand transfers with CGA and LRAD PT Short Term Goal 2 (Week 2): Pt will perform bed<>chair transfers with CGA and LRAD PT Short Term Goal 3 (Week 2): Pt will ambulate at least 165ft with minA and LRAD PT Short Term Goal 4 (Week 2): Pt will ascend/descend 4 steps with modA and LRAD  Skilled Therapeutic Interventions/Progress Updates:     Patient in bed upon PT arrival. Patient alert and agreeable to PT session. Patient denied pain during session.  Therapeutic Activity: Bed Mobility: Patient performed supine to/from sit with supervision with use of bed rail. Provided verbal cues for using elbow to set and push up without bed rail to work toward simulating home set-up, patient initiated this, but was unable and resolved to using bed rail to bring his trunk to sitting. Transfers: Patient performed sit to/from stand x2 using R rail with CGA, PT blocking L LE for safety, and stand pivot with min A bed>w/c and w/c<>mat table, and CGA w/c>bed using bed rail. Provided verbal cues for L foot placement, blocking L posterior knee and tactile cues for hamstring activation for reduced extensor thrust.  Neuromuscular Re-ed: Patient performed the following trunk control andL LE motor control activities for improved hamstring activation focused on knee control with gait to reduce extensor thrust activities: -Seated EOB focused on dynamic trunk control with a functional task (donning pants, shirt, and shoes) had 1 L complete LOB and 1 posterior complete LOB due to over compensating with L hip flexion with trunk retropulsion. Provided multimodal cues for L trunk elongation, attention to trunk control while performing a  secondary task, and use of L UE for support with approximation at wirst and hand to assist with trunk control while using R UE to don clothing. He required assist with holding his R LE in figure four sitting while donning pants and shoes, provided cues for hamstring activation to hold his leg in place. Overall required min A for donning all clothing and total A to tie shoes. >10 min performing this activity. -B reciprocal stepping for forward propulsion of w/c 40 ft, focused on reciprocal pattern and L hamstring activation with multimodal cues and min-mod A for L propulsion -Seated knee extension followed by hamstring curl under mat table, progress from PROM to AAROM to AROM ~50% of full range with multimodal cues for hamstring activation -Standing L terminal knee extension with green band behind knee 2x15 with standing rest break between, B UE support in // bars, maintained L UE on bar >2 min independently after requiring hand over hand assist for placement. Demonstrated compensation with hip flexion to extend his knee, provided multimodal cues and hips and hamstrings to promote increased eccentric hamstring contraction.  -Ambulated at R rail 30 feet forwards/backwards focuesed on terminal knee extension in L stance with PT blocking and providing tactile cues for hamstring activation to prevent extensor thrust, also focused on hamstring and gluteal activation for backwards limb advancement or L LE. Required intermittent assist for L foot placement throughout, able to advance L LE consistently throughout  Educated on functional of L UE throughout session to promote motor control/recovery.   Patient in bed at end of session with breaks locked, bed alarm set, and  all needs within reach.    Therapy Documentation Precautions:  Precautions Precautions: Fall, Other (comment) Precaution Comments: left hemiparesis Restrictions Weight Bearing Restrictions: Yes   Therapy/Group: Individual Therapy  Keslee Harrington  L Demonte Dobratz PT, DPT  05/04/2020, 11:57 AM

## 2020-05-04 NOTE — Patient Care Conference (Signed)
Inpatient RehabilitationTeam Conference and Plan of Care Update Date: 05/04/2020   Time: 11:13 AM    Patient Name: Tony Schmitt      Medical Record Number: [id number]  Date of Birth: 1970/11/30 Sex: Male         Room/Bed: 4W26C/4W26C-01 Payor Info: Payor: /    Admit Date/Time:  04/22/2020  3:08 PM  Primary Diagnosis:  CVA (cerebral vascular accident) [facility])  Hospital Problems: Principal Problem:   CVA (cerebral vascular accident) ([place]) Active Problems:   Hypoalbuminemia due to protein-calorie malnutrition ([place])   Acute blood loss anemia   Leukocytosis   Slow transit constipation   AKI (acute kidney injury) ([place])   Diabetes mellitus type 2 with complications, uncontrolled ([place])   Benign essential HTN    Expected Discharge Date: Expected Discharge Date: 05/12/20  Team Members Present: Physician leading conference: Dr. Delice Lesch Care Coodinator Present: Dorien Chihuahua, RN, BSN, CRRN;Other (comment) Jacqlyn Larsen  [place], SW) Nurse Present: Rayne Du, LPN PT Present: Other (comment) (Christain Manhard, PT) OT Present: Leretha Pol, OT PPS Coordinator present : Gunnar Fusi, SLP     Current Status/Progress Goal Weekly Team Focus  Bowel/Bladder   pt can be inc of bowel and bladder lbm- 9/26  to become cont of bowel and bladder  assess q shift and prn   Swallow/Nutrition/ Hydration             ADL's   CGA to min asssit for functional transfers; setup for UB ADLs, CGA to min assist for LB ADLs  supervision  functional transfers, self care training, AE training, LUE NMR   Mobility   minA transfers, min/modA gait 50-32ft with RW.  supervision/CGA  Stairs! Transfers, gait progressions, standing balance, family ed?   Communication             Safety/Cognition/ Behavioral Observations            Pain   pt has no current complaints of pain  remain pain free  Assess pain q shift and prn   Skin   no current skin issues  remain free of skin breakdown and infection  Assess q  shift and prn     Discharge Planning:  Will need to have MOm come in end of week or early next week to do family training. Mom working on [employer] and [employer] Discussion: MD adjusting BP medications. CBGs fluctuating. Treating KI with IV fluids and monitoring elevated WBC level. Paitent is incontinent of bowel and bladder. Nausea is better Patient on target to meet rehab goals: yes  *See Care Plan and progress notes for long and short-term goals.   Revisions to Treatment Plan:  Work on AROM to shoulder area Statistician; functional transfers and gait practice AFO trials for knee hyperextension and absence of dorsiflexion  Teaching Needs: Transfers, toileting, medications, etc.  Current Barriers to Discharge: Decreased caregiver support and Insurance for SNF coverage  Possible Resolutions to Barriers: Family education      Medical Summary Current Status: Left side hemiparesis secondary to acute/subacute nonhemorrhagic infarct posterior right corona radiata and anterior aspect of left external capsule.  Barriers to Discharge: Medical stability;Other (comments);Incontinence   Possible Resolutions to [employer] Focus: Therapies, optimize BP/DM meds, follow labs - Cr, WBCs, Hb, encourage fluids   Continued Need for Acute Rehabilitation Level of Care: The patient requires daily medical management by a physician with specialized training in physical medicine and rehabilitation for the following reasons: Direction of a multidisciplinary physical  rehabilitation program to maximize functional independence : Yes Medical management of patient stability for increased activity during participation in an intensive rehabilitation regime.: Yes Analysis of laboratory values and/or radiology reports with any subsequent need for medication adjustment and/or medical intervention. : Yes   I attest that I was present, lead the team conference, and concur with the  assessment and plan of the team.   Dorien Chihuahua B 05/04/2020, 1:52 PM

## 2020-05-04 NOTE — Progress Notes (Signed)
[name] PHYSICAL MEDICINE & REHABILITATION PROGRESS NOTE   Subjective/Complaints: Patient seen laying in bed this morning.  He states he slept well overnight.  He notes he wore his orthoses overnight.  He had some nausea overnight and thought it was secondary to his medications.  Resolved this AM.  ROS: Denies CP, SOB, N/V/D  Objective:   No results found. Recent Labs    05/04/20 0710  WBC 10.0  HGB 9.4*  HCT 30.3*  PLT 556*   Recent Labs    05/02/20 0700 05/03/20 1918  NA 140 138  K 3.8 4.0  CL 109 104  CO2 25 27  GLUCOSE 147* 124*  BUN 35* 31*  CREATININE 3.23* 3.26*  CALCIUM 9.1 9.1    Intake/Output Summary (Last 24 hours) at 05/04/2020 1110 Last data filed at 05/04/2020 0730 Gross per 24 hour  Intake 780 ml  Output 850 ml  Net -70 ml        Physical Exam: Vital Signs Blood pressure (!) 148/79, pulse 73, temperature 98.7 F (37.1 C), resp. rate 16, height 5\' 4"  (1.626 m), weight 72.9 kg, SpO2 100 %. Constitutional: No distress . Vital signs reviewed. HENT: Normocephalic.  Atraumatic. Eyes: EOMI. No discharge. Cardiovascular: No JVD.  RRR. Respiratory: Normal effort.  No stridor.  Bilateral clear to auscultation. GI: Non-distended.  BS +. Skin: Warm and dry.  Intact. Psych: Normal mood.  Normal behavior. Musc: No edema in extremities.  No tenderness in extremities. Neuro: Alert Motor: RUE/RLE: 4-4+ /5 proximal distal LUE: 0/5 proximal to distal Left lower extremity: Hip flexion, knee extension 3/5, ankle dorsiflexion 0/5  Assessment/Plan: 1. Functional deficits secondary to RIght corona radiata infarct which require 3+ hours per day of interdisciplinary therapy in a comprehensive inpatient rehab setting.  Physiatrist is providing close team supervision and 24 hour management of active medical problems listed below.  Physiatrist and rehab team continue to assess barriers to discharge/monitor patient progress toward functional and medical  goals  Care Tool:  Bathing  Bathing activity did not occur:  (N/A) Body parts bathed by patient: Right arm, Left arm, Chest, Abdomen, Front perineal area, Buttocks, Right upper leg, Left upper leg, Right lower leg, Left lower leg, Face   Body parts bathed by helper: Right arm     Bathing assist Assist Level: Contact Guard/Touching assist     Upper Body Dressing/Undressing Upper body dressing   What is the patient wearing?: Pull over shirt    Upper body assist Assist Level: Supervision/Verbal cueing    Lower Body Dressing/Undressing Lower body dressing      What is the patient wearing?: Pants, Underwear/pull up     Lower body assist Assist for lower body dressing: Minimal Assistance - Patient > 75%     Toileting Toileting    Toileting assist Assist for toileting: Supervision/Verbal cueing Assistive Device Comment:  (Urinal)   Transfers Chair/bed transfer  Transfers assist     Chair/bed transfer assist level: Minimal Assistance - Patient > 75%     Locomotion Ambulation   Ambulation assist      Assist level: Moderate Assistance - Patient 50 - 74% Assistive device: Walker-rolling Max distance: 56ft   Walk 10 feet activity   Assist     Assist level: Moderate Assistance - Patient - 50 - 74% Assistive device: Walker-rolling   Walk 50 feet activity   Assist Walk 50 feet with 2 turns activity did not occur: Safety/medical concerns  Assist level: Minimal Assistance - Patient > 75% Assistive  device: Walker-rolling    Walk 150 feet activity   Assist Walk 150 feet activity did not occur: Safety/medical concerns         Walk 10 feet on uneven surface  activity   Assist Walk 10 feet on uneven surfaces activity did not occur: Safety/medical concerns         Wheelchair     Assist Will patient use wheelchair at discharge?: No (Per PT goals) Type of Wheelchair: Manual    Wheelchair assist level: Supervision/Verbal cueing Max  wheelchair distance: 122ft    Wheelchair 50 feet with 2 turns activity    Assist        Assist Level: Supervision/Verbal cueing   Wheelchair 150 feet activity     Assist      Assist Level: Supervision/Verbal cueing   Blood pressure (!) 148/79, pulse 73, temperature 98.7 F (37.1 C), resp. rate 16, height 5\' 4"  (1.626 m), weight 72.9 kg, SpO2 100 %.    Medical Problem List and Plan: 1.  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   Continue CIR  WHO/PRAFO nightly 2.  Antithrombotics: -DVT/anticoagulation: Lovenox             -antiplatelet therapy: Aspirin 81 mg daily 3. Pain Management: Tylenol as needed.  4. Mood: Provide emotional support             -antipsychotic agents: N/A 5. Neuropsych: This patient is capable of making decisions on his own behalf. 6. Skin/Wound Care: Routine skin checks 7. Fluids/Electrolytes/Nutrition: Routine in and outs.   8.  Hypertension.    Lisinopril PTA, on hold due to AKI  Norvasc 10 mg daily  Lopressor 12.5 mg twice daily started on 9/21, increased to 50 twice daily on 9/24          Vitals:   05/03/20 1921 05/04/20 0333  BP: (!) 160/86 (!) 148/79  Pulse: 79 73  Resp: 18 16  Temp: 98.7 F (37.1 C) 98.7 F (37.1 C)  SpO2: 98% 100%   Slightly labile on 9/28, will consider further medication adjustments if persistently elevated 9.  Diabetes mellitus with hyperglycemia.  Hemoglobin A1c 9.6.  Check blood sugars before meals and at bedtime.  Diabetic teaching             CBG (last 3)  Recent Labs    05/03/20 1644 05/03/20 2115 05/04/20 0609  GLUCAP 120* 127* 87   Slightly labile on 9/28 10.  AKI on CKD stage III.  Creatinine baseline 2.42 08/02/2018.  Renal ultrasound 04/17/2019 showed no hydronephrosis or shadowing stone.  Started on sodium bicarbonate 1300 mg twice daily 04/17/2020.    Creatinine 3.23 on 9/27  EF  reviewed-60-65%  IVF nightly x3 nights ordered on 9/22 (complete)  -appreciate nephrology follow up 11.  Tobacco abuse.  Counsel 12.  Hyperlipidemia: Lipitor 13.  Slow transit constipation  Improving 14. Leukocytosis:   WBCs 11.4 on 9/22, labs ordered for tomorrow  Afebrile  Continue to monitor 15.  Acute blood loss anemia?  On chronic anemia  Hemoglobin 10.0 on 9/22, labs ordered for tomorrow 16.  Severe hypoalbuminemia  Supplement initiated on 9/21  LOS: 12 days A FACE TO FACE EVALUATION WAS PERFORMED  Tony Schmitt Tony Schmitt 05/04/2020, 11:10 AM

## 2020-05-04 NOTE — Progress Notes (Signed)
[place] PHYSICAL MEDICINE & REHABILITATION PROGRESS NOTE   Subjective/Complaints: Patient seen laying in bed this AM.  He states he slept well overnight.    ROS: Denies CP, SOB, N/V/D  Objective:   No results found. Recent Labs    05/04/20 0710  WBC 10.0  HGB 9.4*  HCT 30.3*  PLT 556*   Recent Labs    05/02/20 0700 05/03/20 1918  NA 140 138  K 3.8 4.0  CL 109 104  CO2 25 27  GLUCOSE 147* 124*  BUN 35* 31*  CREATININE 3.23* 3.26*  CALCIUM 9.1 9.1    Intake/Output Summary (Last 24 hours) at 05/04/2020 1152 Last data filed at 05/04/2020 0730 Gross per 24 hour  Intake 780 ml  Output 850 ml  Net -70 ml        Physical Exam: Vital Signs Blood pressure (!) 148/79, pulse 73, temperature 98.7 F (37.1 C), resp. rate 16, height 5\' 4"  (1.626 m), weight 72.9 kg, SpO2 100 %. Constitutional: No distress . Vital signs reviewed. HENT: Normocephalic.  Atraumatic. Eyes: EOMI. No discharge. Cardiovascular: No JVD.  RRR. Respiratory: Normal effort.  No stridor.  Bilateral clear to auscultation. GI: Non-distended.  BS +. Skin: Warm and dry.  Intact. Psych: Normal mood.  Normal behavior. Musc: No edema in extremities.  No tenderness in extremities. Neuro: Alert Motor: RUE/RLE: 4-4+ /5 proximal distal LUE: Shoulder abduction 2/5, distally 0/5  Left lower extremity: Hip flexion, knee extension 3/5, ankle dorsiflexion 0/5, unchanged  Assessment/Plan: 1. Functional deficits secondary to RIght corona radiata infarct which require 3+ hours per day of interdisciplinary therapy in a comprehensive inpatient rehab setting.  Physiatrist is providing close team supervision and 24 hour management of active medical problems listed below.  Physiatrist and rehab team continue to assess barriers to discharge/monitor patient progress toward functional and medical goals  Care Tool:  Bathing  Bathing activity did not occur:  (N/A) Body parts bathed by patient: Right arm, Left arm,  Chest, Abdomen   Body parts bathed by helper: Right arm     Bathing assist Assist Level: Set up assist     Upper Body Dressing/Undressing Upper body dressing   What is the patient wearing?: Pull over shirt    Upper body assist Assist Level: Set up assist    Lower Body Dressing/Undressing Lower body dressing      What is the patient wearing?: Pants, Underwear/pull up     Lower body assist Assist for lower body dressing: Minimal Assistance - Patient > 75%     Toileting Toileting    Toileting assist Assist for toileting: Supervision/Verbal cueing Assistive Device Comment:  (Urinal)   Transfers Chair/bed transfer  Transfers assist     Chair/bed transfer assist level: Minimal Assistance - Patient > 75%     Locomotion Ambulation   Ambulation assist      Assist level: Moderate Assistance - Patient 50 - 74% Assistive device: Walker-rolling Max distance: 58ft   Walk 10 feet activity   Assist     Assist level: Moderate Assistance - Patient - 50 - 74% Assistive device: Walker-rolling   Walk 50 feet activity   Assist Walk 50 feet with 2 turns activity did not occur: Safety/medical concerns  Assist level: Minimal Assistance - Patient > 75% Assistive device: Walker-rolling    Walk 150 feet activity   Assist Walk 150 feet activity did not occur: Safety/medical concerns         Walk 10 feet on uneven surface  activity  Assist Walk 10 feet on uneven surfaces activity did not occur: Safety/medical concerns         Wheelchair     Assist Will patient use wheelchair at discharge?: No (Per PT goals) Type of Wheelchair: Manual    Wheelchair assist level: Supervision/Verbal cueing Max wheelchair distance: 123ft    Wheelchair 50 feet with 2 turns activity    Assist        Assist Level: Supervision/Verbal cueing   Wheelchair 150 feet activity     Assist      Assist Level: Supervision/Verbal cueing   Blood pressure (!)  148/79, pulse 73, temperature 98.7 F (37.1 C), resp. rate 16, height 5\' 4"  (1.626 m), weight 72.9 kg, SpO2 100 %.    Medical Problem List and Plan: 1.  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   Continue CIR  WHO/PRAFO nightly  Team conference today to discuss current and goals and coordination of care, home and environmental barriers, and discharge planning with nursing, case manager, and therapies. Please see conference note from today as well.  2.  Antithrombotics: -DVT/anticoagulation: Lovenox             -antiplatelet therapy: Aspirin 81 mg daily 3. Pain Management: Tylenol as needed.  4. Mood: Provide emotional support             -antipsychotic agents: N/A 5. Neuropsych: This patient is capable of making decisions on his own behalf. 6. Skin/Wound Care: Routine skin checks 7. Fluids/Electrolytes/Nutrition: Routine in and outs.   8.  Hypertension.    Lisinopril PTA, on hold due to AKI  Norvasc 10 mg daily  Lopressor 12.5 mg twice daily started on 9/21, increased to 50 twice daily on 9/24, increase to 75 on 9/29          Vitals:   05/03/20 1921 05/04/20 0333  BP: (!) 160/86 (!) 148/79  Pulse: 79 73  Resp: 18 16  Temp: 98.7 F (37.1 C) 98.7 F (37.1 C)  SpO2: 98% 100%   9.  Diabetes mellitus with hyperglycemia.  Hemoglobin A1c 9.6.  Check blood sugars before meals and at bedtime.  Diabetic teaching             CBG (last 3)  Recent Labs    05/03/20 2115 05/04/20 0609 05/04/20 1136  GLUCAP 127* 87 89   Controlled on 9/29 10.  AKI on CKD stage III.  Creatinine baseline 2.42 08/02/2018.  Renal ultrasound 04/17/2019 showed no hydronephrosis or shadowing stone.  Started on sodium bicarbonate 1300 mg twice daily 04/17/2020.    Creatinine 3.26 on 9/28  EF reviewed-60-65%  IVF nightly x3 nights ordered on 9/22 (complete)  -appreciate nephrology follow up 11.  Tobacco  abuse.  Counsel 12.  Hyperlipidemia: Lipitor 13.  Slow transit constipation  Improving 14. Leukocytosis:   WBCs 10.0 on 9/29  Afebrile  Continue to monitor 15.  Acute blood loss anemia?  On chronic anemia  Hemoglobin 9.4 on 9/29 16.  Severe hypoalbuminemia  Supplement initiated on 9/21  LOS: 12 days A FACE TO FACE EVALUATION WAS PERFORMED  Maeva Dant [name] 05/04/2020, 11:52 AM

## 2020-05-04 NOTE — Progress Notes (Signed)
Patient ID: Tony Schmitt, male   DOB: 17-Jan-1971, 49 y.o.   MRN: [id number]  Met with pt and Mom who is here visiting to discuss team conference progress toward his goals of supervision level and discharge still 10/7. Have scheduled for Mom to come in Monday at 1:0-3:00 for family training. Her daughter is having surgery tomorrow so needs to be with her. Was asking about Medicaid and SSD,was working with med-assist on this. Will reach out to med-assist regarding this.

## 2020-05-04 NOTE — Progress Notes (Signed)
Physical Therapy Session Note  Patient Details  Name: Tony Schmitt MRN: [id number] Date of Birth: [birth year]  Today's Date: 05/04/2020 PT Individual Time: 0600-4599 PT Individual Time Calculation (min): 72 min   Short Term Goals: Week 2:  PT Short Term Goal 1 (Week 2): Pt will perform sit<>stand transfers with CGA and LRAD PT Short Term Goal 2 (Week 2): Pt will perform bed<>chair transfers with CGA and LRAD PT Short Term Goal 3 (Week 2): Pt will ambulate at least 157ft with minA and LRAD PT Short Term Goal 4 (Week 2): Pt will ascend/descend 4 steps with modA and LRAD  Skilled Therapeutic Interventions/Progress Updates:    Pt received supine in bed, awake and agreeable to PT session; denies pain. Lengthy discussion held with patient regarding DC planning. Informed him of pro-bono options for follow up therapy including [facility] and [facility] PT clinic (spoke with CSW regarding referral source). Also spoke with him regarding concern for STE home and other possible options for accessing w/c for DME such as thrift stores, [place], [place], etc. Also spoke with CSW who informed therapist that family ed is scheduled for Monday (10/4).   Supine<>sit with CGA with bed features, squat<>pivot with CGA/minA with mod cues for setup and sequencing. Pt propelled himself in w/c with hemi-technique with supervision from his room to main therapy gym (>185ft). Performed stair training with R HR support, negotiated up/down x8, 3inch steps, 2 times, with seated rest, requiring modA for ascent and minA for descent. Step-to pattern for stairs with R foot leading ascent, L foot leading descent. Pt with difficulty during ascent due to lack of sagittal plane hip flexion in LLE and decreased proprioception in L foot as well. Then performed gait training in hallway, 70ft + 37ft with minA and RW. Continues to demonstrate significant L knee hyperextension in gait with circumduction  pattern. He is able to prevent knee hyperext ~10% of the time with cues. WC transport back to his room for time management, stand<>pivot with CGA from w/c to EOB, sit>supine with supervision. Ended session semi-reclined in bed with bed alarm on, 3/4 rails up, needs in reach.   Therapy Documentation Precautions:  Precautions Precautions: Fall, Other (comment) Precaution Comments: left hemiparesis Restrictions Weight Bearing Restrictions: No  Therapy/Group: Individual Therapy  Jazlene Bares P Atavia Poppe PT 05/04/2020, 3:37 PM

## 2020-05-04 NOTE — Progress Notes (Addendum)
Occupational Therapy Session Note  Patient Details  Name: Tony Schmitt MRN: [id number] Date of Birth: 12-31-1970  Today's Date: 05/04/2020 OT Individual Time: 1000-1100 OT Individual Time Calculation (min): 60 min    Short Term Goals: Week 2:  OT Short Term Goal 1 (Week 2): Pt will be able to complete stand pivot transfer to toilet with CGA. OT Short Term Goal 2 (Week 2): Pt will be able to don shoes with min A. OT Short Term Goal 3 (Week 2): Pt will be able to lift shoulder to 90 degrees abduction during bathing. OT Short Term Goal 4 (Week 2): Pt will be able to stand at sink to complete grooming with CGA.  Skilled Therapeutic Interventions/Progress Updates:    Pt supine in bed, no c/o pain, reports he washed his lower half this morning, requesting to bathe UB at sink.  Pt completed supine to sit with CGA with HOB lowered and removal of bed rail.  Pt completed squat pivot transfer with CGA EOB to w/c needing mod VCs for body mechanics.  Pt setup at sink for UB bathing and dressing with use of long handled sponge and increased time to donn shirt overhead.  Pt transported to large gym and squat pivot transfer completed with CGA after visual demonstration provided to facilitate increased forward trunk leaning to offload buttocks.  Pt participated in neuro re-ed of right shoulder using mirror for visual feedback and tapping primary mover muscle belly to recruit correct muscle.  Pt completed shoulder elevations, sh FF/ext pushing and pulling ball placed beside pt, and ER/IR with manual support of FA due to significant weakness of biceps.  Pt completed 2 x 10 reps of each.  Improved active shoulder FF/ext noted during neuro re-ed today.  Squat pivot transfer EOM to w/c with CGA and improved body mechanics.  Transported pt back to room.  Squat pivot transfer w/c to EOB with CGA and sit to supine with min assist.  Call bell in reach, bed alarm on.  Therapy Documentation Precautions:   Precautions Precautions: Fall, Other (comment) Precaution Comments: left hemiparesis Restrictions Weight Bearing Restrictions: Yes   Therapy/Group: Individual Therapy  [name] 05/04/2020, 11:24 AM

## 2020-05-05 ENCOUNTER — Inpatient Hospital Stay (HOSPITAL_COMMUNITY): Payer: Self-pay | Admitting: *Deleted

## 2020-05-05 ENCOUNTER — Inpatient Hospital Stay (HOSPITAL_COMMUNITY): Payer: Self-pay

## 2020-05-05 ENCOUNTER — Inpatient Hospital Stay (HOSPITAL_COMMUNITY): Payer: Self-pay | Admitting: Occupational Therapy

## 2020-05-05 LAB — GLUCOSE, CAPILLARY
Glucose-Capillary: 171 mg/dL — ABNORMAL HIGH (ref 70–99)
Glucose-Capillary: 105 mg/dL — ABNORMAL HIGH (ref 70–99)
Glucose-Capillary: 85 mg/dL (ref 70–99)
Glucose-Capillary: 180 mg/dL — ABNORMAL HIGH (ref 70–99)
Glucose-Capillary: 104 mg/dL — ABNORMAL HIGH (ref 70–99)

## 2020-05-05 NOTE — Progress Notes (Signed)
[facility] PHYSICAL MEDICINE & REHABILITATION PROGRESS NOTE   Subjective/Complaints: Patient seen laying in bed this morning, working with therapies.  He states he slept well overnight.  He is aware of discharge date, but thinks he may still need to work on his leg more.  ROS: Denies CP, SOB, N/V/D  Objective:   No results found. Recent Labs    05/04/20 0710  WBC 10.0  HGB 9.4*  HCT 30.3*  PLT 556*   Recent Labs    05/03/20 1918  NA 138  K 4.0  CL 104  CO2 27  GLUCOSE 124*  BUN 31*  CREATININE 3.26*  CALCIUM 9.1    Intake/Output Summary (Last 24 hours) at 05/05/2020 1541 Last data filed at 05/05/2020 1300 Gross per 24 hour  Intake 600 ml  Output 600 ml  Net 0 ml        Physical Exam: Vital Signs Blood pressure (!) 172/88, pulse 75, temperature 98.1 F (36.7 C), resp. rate 16, height 5\' 4"  (1.626 m), weight 72.9 kg, SpO2 100 %. Constitutional: No distress . Vital signs reviewed. HENT: Normocephalic.  Atraumatic. Eyes: EOMI. No discharge. Cardiovascular: No JVD.  RRR. Respiratory: Normal effort.  No stridor.  Bilateral clear to auscultation. GI: Non-distended.  BS +. Skin: Warm and dry.  Intact. Psych: Normal mood.  Normal behavior. Musc: No edema in extremities.  No tenderness in extremities. Neuro: Alert Motor: RUE/RLE: 4-4+/5 proximal distal LUE: Shoulder abduction 2/5, distally 0/5, unchanged Left lower extremity: Hip flexion, knee extension 3/5, ankle dorsiflexion 0/5, unchanged  Assessment/Plan: 1. Functional deficits secondary to RIght corona radiata infarct which require 3+ hours per day of interdisciplinary therapy in a comprehensive inpatient rehab setting.  Physiatrist is providing close team supervision and 24 hour management of active medical problems listed below.  Physiatrist and rehab team continue to assess barriers to discharge/monitor patient progress toward functional and medical goals  Care Tool:  Bathing  Bathing activity did  not occur:  (N/A) Body parts bathed by patient: Right arm, Left arm, Chest, Abdomen   Body parts bathed by helper: Right arm     Bathing assist Assist Level: Set up assist     Upper Body Dressing/Undressing Upper body dressing   What is the patient wearing?: Pull over shirt    Upper body assist Assist Level: Set up assist    Lower Body Dressing/Undressing Lower body dressing      What is the patient wearing?: Pants, Underwear/pull up     Lower body assist Assist for lower body dressing: Minimal Assistance - Patient > 75%     Toileting Toileting    Toileting assist Assist for toileting: Supervision/Verbal cueing Assistive Device Comment:  (Urinal)   Transfers Chair/bed transfer  Transfers assist     Chair/bed transfer assist level: Contact Guard/Touching assist     Locomotion Ambulation   Ambulation assist      Assist level: Minimal Assistance - Patient > 75% Assistive device:  (R rail) Max distance: 30 ft   Walk 10 feet activity   Assist     Assist level: Minimal Assistance - Patient > 75% Assistive device:  (R rail)   Walk 50 feet activity   Assist Walk 50 feet with 2 turns activity did not occur: Safety/medical concerns  Assist level: Minimal Assistance - Patient > 75% Assistive device: Walker-rolling    Walk 150 feet activity   Assist Walk 150 feet activity did not occur: Safety/medical concerns  Walk 10 feet on uneven surface  activity   Assist Walk 10 feet on uneven surfaces activity did not occur: Safety/medical concerns         Wheelchair     Assist Will patient use wheelchair at discharge?: No (Per PT goals) Type of Wheelchair: Manual    Wheelchair assist level: Supervision/Verbal cueing Max wheelchair distance: 193ft    Wheelchair 50 feet with 2 turns activity    Assist        Assist Level: Supervision/Verbal cueing   Wheelchair 150 feet activity     Assist      Assist Level:  Supervision/Verbal cueing   Blood pressure (!) 172/88, pulse 75, temperature 98.1 F (36.7 C), resp. rate 16, height 5\' 4"  (1.626 m), weight 72.9 kg, SpO2 100 %.    Medical Problem List and Plan: 1.  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   Continue CIR  WHO/PRAFO nightly 2.  Antithrombotics: -DVT/anticoagulation: Lovenox             -antiplatelet therapy: Aspirin 81 mg daily 3. Pain Management: Tylenol as needed.  4. Mood: Provide emotional support             -antipsychotic agents: N/A 5. Neuropsych: This patient is capable of making decisions on his own behalf. 6. Skin/Wound Care: Routine skin checks 7. Fluids/Electrolytes/Nutrition: Routine in and outs.   8.  Hypertension.    Lisinopril PTA, on hold due to AKI  Norvasc 10 mg daily  Lopressor 12.5 mg twice daily started on 9/21, increased to 50 twice daily on 9/24, increase to 75 on 9/29          Vitals:   05/04/20 2010 05/05/20 0523  BP: (!) 148/85 (!) 172/88  Pulse: 80 75  Resp: 17 16  Temp: 98.7 F (37.1 C) 98.1 F (36.7 C)  SpO2: 98% 100%   Elevated, but?  Improving on 9/30, will make further adjustments if necessary 9.  Diabetes mellitus with hyperglycemia.  Hemoglobin A1c 9.6.  Check blood sugars before meals and at bedtime.  Diabetic teaching             CBG (last 3)  Recent Labs    05/05/20 0610 05/05/20 0959 05/05/20 1205  GLUCAP 171* 105* 85   Slightly labile on 9/30 10.  AKI on CKD stage III.  Creatinine baseline 2.42 08/02/2018.  Renal ultrasound 04/17/2019 showed no hydronephrosis or shadowing stone.  Started on sodium bicarbonate 1300 mg twice daily 04/17/2020.    Creatinine 3.26 on 9/28  EF reviewed-60-65%  IVF nightly x3 nights ordered on 9/22 (complete)  -appreciate nephrology follow up 11.  Tobacco abuse.  Counsel 12.  Hyperlipidemia: Lipitor 13.  Slow transit  constipation  Improving 14. Leukocytosis:   WBCs 10.0 on 9/29  Afebrile  Continue to monitor 15.  Acute blood loss anemia?  On chronic anemia  Hemoglobin 9.4 on 9/29 16.  Severe hypoalbuminemia  Supplement initiated on 9/21  LOS: 13 days A FACE TO FACE EVALUATION WAS PERFORMED  Tony Schmitt Tony Schmitt 05/05/2020, 3:41 PM

## 2020-05-05 NOTE — Plan of Care (Signed)
  Problem: RH BOWEL ELIMINATION Goal: RH STG MANAGE BOWEL WITH ASSISTANCE Description: STG Manage Bowel with supervision/min Assistance. Outcome: Progressing Goal: RH STG MANAGE BOWEL W/MEDICATION W/ASSISTANCE Description: STG Manage Bowel with Medication with supervision/min Assistance. Outcome: Progressing   Problem: RH SAFETY Goal: RH STG ADHERE TO SAFETY PRECAUTIONS W/ASSISTANCE/DEVICE Description: STG Adhere to Safety Precautions With supervision/min Assistance and appropriate assistive Device. Outcome: Progressing

## 2020-05-05 NOTE — Progress Notes (Signed)
Physical Therapy Session Note  Patient Details  Name: Tony Schmitt MRN: [id number] Date of Birth: 14-Nov-1970  Today's Date: 05/05/2020 PT Individual Time: 1100-1157 PT Individual Time Calculation (min): 57 min   Short Term Goals: Week 2:  PT Short Term Goal 1 (Week 2): Pt will perform sit<>stand transfers with CGA and LRAD PT Short Term Goal 2 (Week 2): Pt will perform bed<>chair transfers with CGA and LRAD PT Short Term Goal 3 (Week 2): Pt will ambulate at least 183ft with minA and LRAD PT Short Term Goal 4 (Week 2): Pt will ascend/descend 4 steps with modA and LRAD  Skilled Therapeutic Interventions/Progress Updates:    Pt received sleeping supine in bed, awakens easily to voice and agreeable to PT session; denies pain and reports improved nausea since previous PT session. He reports the nausea was contributed by breakfast and "eating too much." Performed supine<>sit with minA towards weaker L side and stand<>pivot with min/modA from EOB to w/c towards stronger R side. Pt self propelled himself >29ft with supervision from his room to dayroom therapy gym via hemi-technique, slight difficulty turning into doorways but was able with extra time. Focus of session was to practice functional transfer training and w/c setup for transfers. Performed several (>20 reps) squat<>pivot (sometimes stand<>pivot) transfers from w/c<>mat table, fluctuating b/w minA and CGA. Instruction on head/hip technique, hand/foot placement, and safety approaches throughout. Pt with difficulty clearing buttock over w/c wheel with squat<>pivots and has greater difficulty going towards his stronger R side. Also performed several bouts of lateral scooting along EOM with CGA in both L and R directions to assist with carryover during functional transfers. WC transport for time management back to his room, performed stand<>pivot with CGA from w/c to EOB going towards weaker L side. Required minA for sit>supine for LLE management and  he was able to supine scoot mod I using bed features for repositioning. Pt ended session supine in bed, needs in reach, 3/4 rails up., bed alarm on.  Therapy Documentation Precautions:  Precautions Precautions: Fall, Other (comment) Precaution Comments: left hemiparesis Restrictions Weight Bearing Restrictions: No  Therapy/Group: Individual Therapy  Tony Schmitt PT 05/05/2020, 12:08 PM

## 2020-05-05 NOTE — Progress Notes (Signed)
Occupational Therapy Session Note  Patient Details  Name: Rasheem Figiel Hobin MRN: [id number] Date of Birth: 01/03/1971  Today's Date: 05/05/2020 OT Individual Time: 1405-1505 OT Individual Time Calculation (min): 60 min    Short Term Goals: Week 2:  OT Short Term Goal 1 (Week 2): Pt will be able to complete stand pivot transfer to toilet with CGA. OT Short Term Goal 2 (Week 2): Pt will be able to don shoes with min A. OT Short Term Goal 3 (Week 2): Pt will be able to lift shoulder to 90 degrees abduction during bathing. OT Short Term Goal 4 (Week 2): Pt will be able to stand at sink to complete grooming with CGA.  Skilled Therapeutic Interventions/Progress Updates:    Pt supine asleep in bed, upon waking, reports no pain, ready for OT session.  Pt required mod assist supine to sidelying left and Sidelying to sit with VCs to increase carryover of bodymechanic strategies previously taught in prior OT sessions.  Pt ambulated using RW EOB to bathroom  With mod assist needing manual positioning of LLE throughout gait .  Pt completed sidestepping into walk in shower and stand to sit at TTB with min assist with use of grab bar.  Pt completed UB dressing to unbutton and doff shirt and then donn overhead after showering with mod I and increased time.  Pt doffed pants with CGA for standing portion to pull over hips.  Pt bathed UB and LB with supervision and use of long handled sponge.  Educated pt on side leaning technique to wash buttocks in sitting due to pt reports he will not have assist with bathing at home.  LB dressing completed with max assist at [facility] for time management.  Pt requesting to return back to bed.  Pt ambulated using RW bathroom to EOB with same level of assist as previously mentioned.  Mod I sit to supine.  Call bell in reach, bed alarm on.  Therapy Documentation Precautions:  Precautions Precautions: Fall, Other (comment) Precaution Comments: left hemiparesis Restrictions Weight Bearing  Restrictions: No   Therapy/Group: Individual Therapy  [name] 05/05/2020, 4:24 PM

## 2020-05-05 NOTE — Progress Notes (Signed)
Physical Therapy Session Note  Patient Details  Name: Tony Schmitt MRN: [id number] Date of Birth: 03-Sep-1970  Today's Date: 05/05/2020 PT Individual Time: 0902-1000 PT Individual Time Calculation (min): 58 min   Short Term Goals: Week 2:  PT Short Term Goal 1 (Week 2): Pt will perform sit<>stand transfers with CGA and LRAD PT Short Term Goal 2 (Week 2): Pt will perform bed<>chair transfers with CGA and LRAD PT Short Term Goal 3 (Week 2): Pt will ambulate at least 120ft with minA and LRAD PT Short Term Goal 4 (Week 2): Pt will ascend/descend 4 steps with modA and LRAD  Skilled Therapeutic Interventions/Progress Updates:    Patient received supine in bed agreeable to PT. He denies pain, but endorses fatigue and "some nausea." He is able to come to sitting edge of bed with verbal cuing and supervision for safety. CGA for squat pivot to wc. PT propelling patient in wc to therapy gym for time management. He was able to ambulate on treadmill with LiteGait: 2 mins at 0.37mph for 59ft, 2.77mins at 0.30mph for 48ft and 1.94mins at 0.19mph for 52ft. Patient requiring seated rest break between bouts. Manual facilitation provided to minimize L knee hyper extension in terminal stance and ModA for proper foot placement to minimize L LE scissoring in stance phase. Patient completing minisquats in // bars to emphasize L knee control minimizing L knee hyperextension and proper weight distribution between B LE. Patient requesting to return to room due to increase in nausea. RN aware and at bedside. Patient remaining in wc, seatbelt alarm on, call light within reach.   Therapy Documentation Precautions:  Precautions Precautions: Fall, Other (comment) Precaution Comments: left hemiparesis Restrictions Weight Bearing Restrictions: No    Therapy/Group: Individual Therapy  Karoline Caldwell, PT, DPT, CBIS 05/05/2020, 7:36 AM

## 2020-05-06 ENCOUNTER — Inpatient Hospital Stay (HOSPITAL_COMMUNITY): Payer: Self-pay | Admitting: Occupational Therapy

## 2020-05-06 ENCOUNTER — Inpatient Hospital Stay (HOSPITAL_COMMUNITY): Payer: Self-pay

## 2020-05-06 DIAGNOSIS — D75839 Thrombocytosis, unspecified: Secondary | ICD-10-CM

## 2020-05-06 LAB — GLUCOSE, CAPILLARY
Glucose-Capillary: 212 mg/dL — ABNORMAL HIGH (ref 70–99)
Glucose-Capillary: 115 mg/dL — ABNORMAL HIGH (ref 70–99)
Glucose-Capillary: 148 mg/dL — ABNORMAL HIGH (ref 70–99)
Glucose-Capillary: 147 mg/dL — ABNORMAL HIGH (ref 70–99)

## 2020-05-06 NOTE — Progress Notes (Signed)
Occupational Therapy Weekly Progress Note  Patient Details  Name: Tony Schmitt MRN: [id number] Date of Birth: 1970/11/07  Beginning of progress report period: April 29, 2020 End of progress report period: May 06, 2020  Today's Date: 05/06/2020 OT Individual Time: 3536-1443 OT Individual Time Calculation (min): 62 min    Patient has met 4 of 4 short term goals.  Pt making steady gains towards OT goals evidenced by increased independence with SPT from min assist to CGA, increased AROM right shoulder abduction, and increased independence with LB dressing and oral hygiene. Pt requires further neuro re-ed LUE due to weakness and impaired coordination. Pt needs reinforcement of AE training for LB dressing to increase independence. Pt will require thorough HEP for LUE strengthening due to pt without insurance.    Patient continues to demonstrate the following deficits: muscle weakness, decreased cardiorespiratoy endurance, ataxia, decreased coordination and decreased motor planning, decreased initiation, decreased awareness, decreased problem solving, decreased safety awareness, decreased memory and delayed processing and decreased sitting balance, decreased standing balance, hemiplegia and decreased balance strategies and therefore will continue to benefit from skilled OT intervention to enhance overall performance with BADL.  Patient progressing toward long term goals..  Continue plan of care.  OT Short Term Goals Week 2:  OT Short Term Goal 1 (Week 2): Pt will be able to complete stand pivot transfer to toilet with CGA. OT Short Term Goal 1 - Progress (Week 2): Met OT Short Term Goal 2 (Week 2): Pt will be able to don shoes with min A. OT Short Term Goal 2 - Progress (Week 2): Met OT Short Term Goal 3 (Week 2): Pt will be able to lift shoulder to 90 degrees abduction during bathing. OT Short Term Goal 3 - Progress (Week 2): Met OT Short Term Goal 4 (Week 2): Pt will be able to stand at  sink to complete grooming with CGA. OT Short Term Goal 4 - Progress (Week 2): Met Week 3:  OT Short Term Goal 1 (Week 3): STGs=LTGs due to ELOS  Skilled Therapeutic Interventions/Progress Updates:    Pt sitting up in w/c agreeable to OT session.  Pt educated on compensatory techniques during donning/doffing of socks and shoes including use of reacher and propping LLE against surface during figure 4 positioning.  Pt able to donn/doff socks and shoes with min assist after education and visual demo.  Pt stood at sinkside to complete oral hygiene with supervision.  Pt needing visual demo for problem solving to open denture container with one hand.  Pt completed SPT at toilet using grab bar with CGA and simulated toileting clothing mgt and pericare with CGA.  Call bell in reach,seat belt alarm on.  Therapy Documentation Precautions:  Precautions Precautions: Fall, Other (comment) Precaution Comments: left hemiparesis Restrictions Weight Bearing Restrictions: No   Therapy/Group: Individual Therapy  [name] 05/06/2020, 1:44 PM

## 2020-05-06 NOTE — Progress Notes (Signed)
[facility] PHYSICAL MEDICINE & REHABILITATION PROGRESS NOTE   Subjective/Complaints: Patient seen sitting up this morning.  He states he slept well overnight.  He denies complaints.  ROS: Denies CP, SOB, N/V/D  Objective:   No results found. Recent Labs    05/04/20 0710  WBC 10.0  HGB 9.4*  HCT 30.3*  PLT 556*   Recent Labs    05/03/20 1918  NA 138  K 4.0  CL 104  CO2 27  GLUCOSE 124*  BUN 31*  CREATININE 3.26*  CALCIUM 9.1    Intake/Output Summary (Last 24 hours) at 05/06/2020 1240 Last data filed at 05/06/2020 0725 Gross per 24 hour  Intake 940 ml  Output 620 ml  Net 320 ml        Physical Exam: Vital Signs Blood pressure (!) 141/79, pulse 77, temperature 98.3 F (36.8 C), resp. rate 14, height 5\' 4"  (1.626 m), weight 72.9 kg, SpO2 100 %.  Constitutional: No distress . Vital signs reviewed. HENT: Normocephalic.  Atraumatic. Eyes: EOMI. No discharge. Cardiovascular: No JVD.  RRR. Respiratory: Normal effort.  No stridor.  Bilateral clear to auscultation. GI: Non-distended.  BS +. Skin: Warm and dry.  Intact. Psych: Normal mood.  Normal behavior. Musc: No edema in extremities.  No tenderness in extremities. Neuro: Alert Motor: RUE/RLE: 4-4+/5 proximal distal LUE: Shoulder abduction 2/5, distally 0/5, stable Left lower extremity: Hip flexion, knee extension 3/5, ankle dorsiflexion 0/5, stable  Assessment/Plan: 1. Functional deficits secondary to RIght corona radiata infarct which require 3+ hours per day of interdisciplinary therapy in a comprehensive inpatient rehab setting.  Physiatrist is providing close team supervision and 24 hour management of active medical problems listed below.  Physiatrist and rehab team continue to assess barriers to discharge/monitor patient progress toward functional and medical goals  Care Tool:  Bathing  Bathing activity did not occur:  (N/A) Body parts bathed by patient: Right arm, Left arm, Chest, Abdomen   Body  parts bathed by helper: Right arm     Bathing assist Assist Level: Set up assist     Upper Body Dressing/Undressing Upper body dressing   What is the patient wearing?: Pull over shirt    Upper body assist Assist Level: Set up assist    Lower Body Dressing/Undressing Lower body dressing      What is the patient wearing?: Pants, Underwear/pull up     Lower body assist Assist for lower body dressing: Minimal Assistance - Patient > 75%     Toileting Toileting    Toileting assist Assist for toileting: Supervision/Verbal cueing Assistive Device Comment:  (Urinal)   Transfers Chair/bed transfer  Transfers assist     Chair/bed transfer assist level: Contact Guard/Touching assist     Locomotion Ambulation   Ambulation assist      Assist level: Minimal Assistance - Patient > 75% Assistive device:  (R rail) Max distance: 30 ft   Walk 10 feet activity   Assist     Assist level: Minimal Assistance - Patient > 75% Assistive device:  (R rail)   Walk 50 feet activity   Assist Walk 50 feet with 2 turns activity did not occur: Safety/medical concerns  Assist level: Minimal Assistance - Patient > 75% Assistive device: Walker-rolling    Walk 150 feet activity   Assist Walk 150 feet activity did not occur: Safety/medical concerns         Walk 10 feet on uneven surface  activity   Assist Walk 10 feet on uneven surfaces activity  did not occur: Safety/medical concerns         Wheelchair     Assist Will patient use wheelchair at discharge?: No (Per PT goals) Type of Wheelchair: Manual    Wheelchair assist level: Supervision/Verbal cueing Max wheelchair distance: 127ft    Wheelchair 50 feet with 2 turns activity    Assist        Assist Level: Supervision/Verbal cueing   Wheelchair 150 feet activity     Assist      Assist Level: Supervision/Verbal cueing   Blood pressure (!) 141/79, pulse 77, temperature 98.3 F (36.8 C),  resp. rate 14, height 5\' 4"  (1.626 m), weight 72.9 kg, SpO2 100 %.    Medical Problem List and Plan: 1.  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   Continue CIR  WHO/PRAFO nightly 2.  Antithrombotics: -DVT/anticoagulation: Lovenox             -antiplatelet therapy: Aspirin 81 mg daily 3. Pain Management: Tylenol as needed.  4. Mood: Provide emotional support             -antipsychotic agents: N/A 5. Neuropsych: This patient is capable of making decisions on his own behalf. 6. Skin/Wound Care: Routine skin checks 7. Fluids/Electrolytes/Nutrition: Routine in and outs.   8.  Hypertension.    Lisinopril PTA, on hold due to AKI  Norvasc 10 mg daily  Lopressor 12.5 mg twice daily started on 9/21, increased to 50 twice daily on 9/24, increase to 75 on 9/29          Vitals:   05/06/20 0303 05/06/20 0458  BP: (!) 145/84 (!) 141/79  Pulse: 75 77  Resp: 14 14  Temp: 98.6 F (37 C) 98.3 F (36.8 C)  SpO2: 99% 100%   Elevated, but appears to be improving on 10/1, monitor trend 9.  Diabetes mellitus with hyperglycemia.  Hemoglobin A1c 9.6.  Check blood sugars before meals and at bedtime.  Diabetic teaching             CBG (last 3)  Recent Labs    05/05/20 2122 05/06/20 0615 05/06/20 1123  GLUCAP 104* 212* 115*   Slightly labile on 10/1 10.  AKI on CKD stage III.  Creatinine baseline 2.42 08/02/2018.  Renal ultrasound 04/17/2019 showed no hydronephrosis or shadowing stone.  Started on sodium bicarbonate 1300 mg twice daily 04/17/2020.    Creatinine 3.26 on 9/28, labs ordered for Monday  EF reviewed-60-65%  IVF nightly x3 nights ordered on 9/22 (complete)  -appreciate nephrology follow up 11.  Tobacco abuse.  Counsel 12.  Hyperlipidemia: Lipitor 13.  Slow transit constipation  Improving 14. Leukocytosis:   WBCs 10.0 on 9/29  Afebrile  Continue to monitor 15.  Acute blood  loss anemia?  On chronic anemia  Hemoglobin 9.4 on 9/29 16.  Severe hypoalbuminemia  Supplement initiated on 9/21 17.  Thrombocytosis  Platelets 556 on 9/29, labs ordered for Monday  Continue to monitor  LOS: 14 days A FACE TO FACE EVALUATION WAS PERFORMED  Gaby Harney [name] 05/06/2020, 12:40 PM

## 2020-05-06 NOTE — Progress Notes (Signed)
Physical Therapy Weekly Progress Note  Patient Details  Name: Tony Schmitt MRN: [id number] Date of Birth: 02/09/1971  Beginning of progress report period: April 29, 2020 End of progress report period: May 06, 2020  Today's Date: 05/06/2020 PT Individual Time: 0900-1000+ 1300-1411 PT Individual Time Calculation (min): 80mn + 71 min   Patient has met 3 of 4 short term goals. Pt is making slow but appropriate progress towards his goals. He is now able to complete bed mobility with CGA (sometimes requiring minA if going to L EOB), sit<>stand transfers with CGA and RW, bed<>chair transfers fluctuate b/w CGA and minA via squat-pivot, he has ambulated ~1024fwith minA and RW, and he has negotiated up/down x8 3inch steps with modA and R HR support. He continues to be primarily limited by L HB weakness, impaired LLE proprioception, impaired problem solving and awareness, and has had limited carryover with compensatory strategies. Family education is scheduled for Monday, 10/4, to assist in preparing him for safe DC home.  Patient continues to demonstrate the following deficits muscle weakness, impaired timing and sequencing, unbalanced muscle activation, motor apraxia, decreased coordination and decreased motor planning and decreased attention to left and decreased motor planning and therefore will continue to benefit from skilled PT intervention to increase functional independence with mobility.  Patient progressing toward long term goals.  Continue plan of care.  PT Short Term Goals Week 1:  PT Short Term Goal 1 (Week 1): Pt will perform sit<>stands with min assist PT Short Term Goal 2 (Week 1): Pt will perform bed<>chair transfers with min assist PT Short Term Goal 3 (Week 1): Pt will ambulate at least 3047fsing LRAD with mod assist PT Short Term Goal 4 (Week 1): Pt will ascend/descend 4 steps using HRs with mod assist Week 2:  PT Short Term Goal 1 (Week 2): Pt will perform sit<>stand  transfers with CGA and LRAD PT Short Term Goal 2 (Week 2): Pt will perform bed<>chair transfers with CGA and LRAD PT Short Term Goal 3 (Week 2): Pt will ambulate at least 100f39fth minA and LRAD PT Short Term Goal 4 (Week 2): Pt will ascend/descend 4 steps with modA and LRAD Week 3:  PT Short Term Goal 1 (Week 3): STG = LTG due to ELOS  Skilled Therapeutic Interventions/Progress Updates:   1st session:   Pt received supine in bed, agreeable to PT session, denies any pain. He reports that he spoke with his mom regarding w/c options and that she prefer's he not go home with a w/c and "will be using a walker." Spoke with patient regarding concern for functional ambulation by DC and would likely be more safe using w/c rather than RW, he voiced understanding. Family ed scheduled for Monday 10/4 and will follow up with family regarding concerns. Supine<>sit with minA for trunk control, using bed features. Squat<>Pivot with CGA towards stronger R side from EOB to w/c. Pt self propelled himself from his room to dayroom gym (>150ft4fth distant supervision using hemi technique. Focus of session gait training and he performed 2x90ft 77f minA and RW. Continues to demonstrate L knee hyperextension in stance, decreased sagital plane hip flexion with over compensation of L adductors, and difficulty with RW management during turns. He was able to correct L knee hyperextension ~50% of the time with mod cues which is an improvement since prior sessions! Pt then reporting urgent need for toilet for BM. WC transport for time management back to his room and wheeled inside bathroom.  Stand<>pivot with minA towards stronger R side from w/c to toilet, using grab bars in bathroom. He was able to pull his pants down with his RUE while standing with minA guard, required minA for controlled lowering to toilet. Wheeled sinkside to wash his hands while seated in chair and ended session seated in w/c with needs in reach, seat belt  alarm on, LUE supported by pillow.  2nd session: Pt received sitting in w/c, agreeable to PT session; denies any pain. He self propelled himself with supervision via hemi technique in w/c from his room to main therapy gym. Performed sit<>stand with minA from w/c to stairs. He navigated up/down 2x4 (seated rest break), 6inch, steps with R HR support and modA. Pt shows hip hiking LLE to advance during ascent and demonstrates L knee hyperextension in stance during descent. Pt requesting to focus on gait during the remaining session, so he ambulated 78f + 1177f+ 15068fith minA and RW. Assist mostly required for RW management and occasional truncal stability. He continues to show improved ability to reduce L knee hyperextension ~75% of the time with min cues. Sit>supine with CGA with HOB elevated, needs in reach, bed alarm on. Pt pleased with progress.   Therapy Documentation Precautions:  Precautions Precautions: Fall, Other (comment) Precaution Comments: left hemiparesis Restrictions Weight Bearing Restrictions: No  Therapy/Group: Individual Therapy  Nolah Krenzer P Amaal Dimartino PT 05/06/2020, 7:49 AM

## 2020-05-06 NOTE — Plan of Care (Signed)
  Problem: RH BOWEL ELIMINATION Goal: RH STG MANAGE BOWEL WITH ASSISTANCE Description: STG Manage Bowel with supervision/min Assistance. Outcome: Progressing Goal: RH STG MANAGE BOWEL W/MEDICATION W/ASSISTANCE Description: STG Manage Bowel with Medication with supervision/min Assistance. Outcome: Progressing   Problem: RH SAFETY Goal: RH STG ADHERE TO SAFETY PRECAUTIONS W/ASSISTANCE/DEVICE Description: STG Adhere to Safety Precautions With supervision/min Assistance and appropriate assistive Device. Outcome: Progressing

## 2020-05-07 ENCOUNTER — Inpatient Hospital Stay (HOSPITAL_COMMUNITY): Payer: Self-pay

## 2020-05-07 ENCOUNTER — Inpatient Hospital Stay (HOSPITAL_COMMUNITY): Payer: Self-pay | Admitting: Physical Therapy

## 2020-05-07 LAB — GLUCOSE, CAPILLARY
Glucose-Capillary: 131 mg/dL — ABNORMAL HIGH (ref 70–99)
Glucose-Capillary: 130 mg/dL — ABNORMAL HIGH (ref 70–99)
Glucose-Capillary: 107 mg/dL — ABNORMAL HIGH (ref 70–99)
Glucose-Capillary: 407 mg/dL — ABNORMAL HIGH (ref 70–99)

## 2020-05-07 MED ORDER — INSULIN ASPART 100 UNIT/ML ~~LOC~~ SOLN
7.0000 [IU] | Freq: Once | SUBCUTANEOUS | Status: AC
  Administered 2020-05-07: 7 [IU] via SUBCUTANEOUS

## 2020-05-07 MED ORDER — METOPROLOL TARTRATE 50 MG PO TABS
100.0000 mg | ORAL_TABLET | Freq: Two times a day (BID) | ORAL | Status: DC
  Administered 2020-05-07 – 2020-05-12 (×10): 100 mg via ORAL
  Filled 2020-05-07 (×10): qty 2

## 2020-05-07 NOTE — Progress Notes (Signed)
Occupational Therapy Session Note  Patient Details  Name: Tony Schmitt MRN: [id number] Date of Birth: 02-Sep-1970  Today's Date: 05/07/2020 OT Individual Time: 4158-3094 OT Individual Time Calculation (min): 44 min    Short Term Goals: Week 2:  OT Short Term Goal 1 (Week 2): Pt will be able to complete stand pivot transfer to toilet with CGA. OT Short Term Goal 1 - Progress (Week 2): Met OT Short Term Goal 2 (Week 2): Pt will be able to don shoes with min A. OT Short Term Goal 2 - Progress (Week 2): Met OT Short Term Goal 3 (Week 2): Pt will be able to lift shoulder to 90 degrees abduction during bathing. OT Short Term Goal 3 - Progress (Week 2): Met OT Short Term Goal 4 (Week 2): Pt will be able to stand at sink to complete grooming with CGA. OT Short Term Goal 4 - Progress (Week 2): Met Week 3:  OT Short Term Goal 1 (Week 3): STGs=LTGs due to ELOS  Skilled Therapeutic Interventions/Progress Updates:    1:1. Pt received in bed agreeable to OT with no pain mild dizziness but vitals WNL throughotu session. Pt completes CGA stand pivot transfer into and out of bed and pt requesting to wash at sink. Pt doffs pull over shirt with S, washes UB with HOH A to use LUE ot wash RUE. OT demo compensaotry strategy to wash R armpit and RUE without L hand and OT rinses body using methods. Pt dons button up shirt sit (thread LUE) to stand with MIN A for standing balance while pulling around back and A to button up. trialed button hook but unsuccessful. Pt edu re vlecro fasteners for improved independence with dressing. Pt completes peri and buttock bathing with MIN A overall for standing balance and tactile cues for L knee extension in stanidng and R weight shift. Pt requires A for threading 1LE into LB clothing d/t time restraints and A to advance past L hp. Exited sesion with pt seated in bed,e xit alarm on and call light in reach  Therapy Documentation Precautions:  Precautions Precautions: Fall, Other  (comment) Precaution Comments: left hemiparesis Restrictions Weight Bearing Restrictions: No General:   Vital Signs:  Pain:   ADL: ADL Eating: Set up Grooming: Setup Upper Body Bathing: Setup Where Assessed-Upper Body Bathing: Shower Lower Body Bathing: Contact guard Where Assessed-Lower Body Bathing: Shower Upper Body Dressing: Supervision/safety Where Assessed-Upper Body Dressing: Chair Lower Body Dressing: Moderate assistance Where Assessed-Lower Body Dressing: Chair Toileting: Moderate assistance Where Assessed-Toileting: Bedside Commode Toilet Transfer: Minimal assistance Toilet Transfer Method: Squat pivot, Stand pivot Toilet Transfer Equipment: Energy manager: Minimal assistance Social research officer, government Method: Radiographer, therapeutic: Radio broadcast assistant, Grab bars (simulated) Vision   Perception    Praxis   Exercises:   Other Treatments:     Therapy/Group: Individual Therapy  [name] 05/07/2020, 1:01 PM

## 2020-05-07 NOTE — Progress Notes (Signed)
Physical Therapy Session Note  Patient Details  Name: Tony Schmitt MRN: [id number] Date of Birth: 29-Sep-1970  Today's Date: 05/07/2020 PT Individual Time: 5277-8242 PT Individual Time Calculation (min): 63 min   Short Term Goals: Week 3:  PT Short Term Goal 1 (Week 3): STG = LTG due to ELOS  Skilled Therapeutic Interventions/Progress Updates:    Pt received supine in bed with NT present assessing BS and pt agreeable to therapy session. Supine>sitting L EOB, HOB elevated and relying heavily on bedrail with R UE for rolling and trunk upright with close supervision for safety. R squat pivot EOB>w/c with min assist for balance/pivoting and assist for L LE positioning before transfer.  Transported to/from gym in w/c for time management and energy conservation. Sit<>stands using RW with CGA/min assist and therapist cuing for L hand placement on/off hand splint and cuing to use R hand to place strap of splint - has L lean/squat while doing this requiring min assist for balance and cuing for attention to balance due to impaired dual-task focus. Gait training ~91ft using RW with min assist for balance and pt demoing L LE externally rotated throughout swing with adductor compensation for advancement (lacking hip/knee flexion muscle activation) and sliding foot forward on ground lacking foot clearance, L knee flexed in partial squat position during stance with pt stating he is trying to avoid knee hyperextension. Therapy session focused on hamstring activation to help prevent knee hyperextension during stance as well as hip flexor and hamstring activation for increased foot clearance and more normalized swing phase gait mechanics.   Standing>tall kneeling on mat table with min/mod assist for balance and max/total assist for L LE placement onto mat.  Performed the following in tall kneeling:  - R UE cross body reach to perform card matching task focusing on trunk control and R/L weight shift with sustained  glute/hamstring activation - attempted repeated hip flexion<>extension going from tall kneeling to/from butt on heels with pt having significant L quad muscle contraction causing pain/discomfort therefore performed only a few reps through small ROM to avoid the pain - L LE glute/hip abductor focus via moving R knee laterally in/out with cuing and facilitation for sustained L hip extension Requires varying min/mod assist for balance throughout Tall kneeling>sitting in w/c with mod assist for balance.  Performed forward w/c propulsion using L LE only focusing on alternating between quad activation to extend LE then hamstring activation to pull w/c forward - therapist facilitating L heel contact on ground with pt noticed to have descent hamstring muscle activation to bring w/c forward through limited ROM.  Standing at stairs with R UE support on HR and min assist for balance performed L LE NMR via heel taps on/off 2nd step transitioned to 1st step as the 2nd was a little too high - focused on proper form/technique via hip/knee flexion followed by knee extension to place heel onto step - therapist providing max assist initially to perform hip/knee flexion progressed to mod assist with improved motor planning and proper motor recruitment/muscle activation to avoid activating hip flexors and quads at the same time and instead activate hip flexors and hamstrings at the same time to pick foot up from floor; however, he was then unable to swap from hamstring activation to quad activation to kick foot forward and place heel on step without assist.   Gait training ~36ft with RW and heavier min assist for balance due to therapist placing a boomwacker on floor for pt to step over during  L LE swing phase of gait to reinforce and carryover improved motor planning/muscle recruitment of hip flexors and hamstring for improved foot clearance and more normalized gait mechanics (also had decreased hip external rotation) -  continued to lack transition to knee extension at end of swing phase to place heel on ground at initial contact as was noticed during above L LE NMR training at stairs - noticed decreased L knee hyperextension during stance phase with improved L LE swing phase mechanics. R squat pivot to w/c with min assist and continued assist for L LE management. Transported back to room and pt requesting to return to bed due to w/c and recliner being uncomfortable. L stand pivot to EOB with min assist for balance and manual facilitation for L LE placement as it continues to have excessive external rotation. Sit>supine, HOB partially elevated, with supervision. Pt's mom present and therapist educated her on ensuring L hand elevated on pillows when pt in bed due to significant edema noted in it. Pt left in modified chair position in bed with needs in reach, bed alarm on, and meal tray set-up.   Therapy Documentation Precautions:  Precautions Precautions: Fall, Other (comment) Precaution Comments: left hemiparesis Restrictions Weight Bearing Restrictions: No  Pain: Reported some L quad muscle pain/soreness/discomfort with a certain tall kneeling task, details above, modified intervention to avoid pain.   Therapy/Group: Individual Therapy  Tawana Scale , PT, DPT, CSRS  05/07/2020, 3:58 PM

## 2020-05-07 NOTE — Plan of Care (Signed)
  Problem: RH BOWEL ELIMINATION Goal: RH STG MANAGE BOWEL WITH ASSISTANCE Description: STG Manage Bowel with supervision/min Assistance. Outcome: Progressing Goal: RH STG MANAGE BOWEL W/MEDICATION W/ASSISTANCE Description: STG Manage Bowel with Medication with supervision/min Assistance. Outcome: Progressing   Problem: RH SKIN INTEGRITY Goal: RH STG MAINTAIN SKIN INTEGRITY WITH ASSISTANCE Description: STG Maintain Skin Integrity With supervision/min Assistance. Outcome: Progressing

## 2020-05-07 NOTE — Progress Notes (Signed)
PHYSICAL MEDICINE & REHABILITATION PROGRESS NOTE   Subjective/Complaints: No complaints Has been nauseous- has IV in for fluids and nausea medications. Working with Colletta Maryland OT  ROS: denies CP, SOB, N/V/D  Objective:   No results found. No results for input(s): WBC, HGB, HCT, PLT in the last 72 hours. No results for input(s): NA, K, CL, CO2, GLUCOSE, BUN, CREATININE, CALCIUM in the last 72 hours.  Intake/Output Summary (Last 24 hours) at 05/07/2020 1441 Last data filed at 05/07/2020 1230 Gross per 24 hour  Intake 360 ml  Output 1450 ml  Net -1090 ml        Physical Exam: Vital Signs Blood pressure (!) 154/80, pulse 77, temperature 99 F (37.2 C), resp. rate 18, height 5\' 4"  (1.626 m), weight 72.9 kg, SpO2 100 %.   General: Alert and oriented x 3, No apparent distress HEENT: Head is normocephalic, atraumatic, PERRLA, EOMI, sclera anicteric, oral mucosa pink and moist, dentition intact, ext ear canals clear,  Neck: Supple without JVD or lymphadenopathy Heart: Reg rate and rhythm. No murmurs rubs or gallops Chest: CTA bilaterally without wheezes, rales, or rhonchi; no distress Abdomen: Soft, non-tender, non-distended, bowel sounds positive. Extremities: No clubbing, cyanosis, or edema. Pulses are 2+ Skin: Warm and dry.  Intact. Psych: Normal mood.  Normal behavior. Musc: No edema in extremities.  No tenderness in extremities. Neuro: Alert Motor: RUE/RLE: 4-4+/5 proximal distal LUE: Shoulder abduction 2/5, distally 0/5, stable Left lower extremity: Hip flexion, knee extension 3/5, ankle dorsiflexion 0/5, stable    Assessment/Plan: 1. Functional deficits secondary to RIght corona radiata infarct which require 3+ hours per day of interdisciplinary therapy in a comprehensive inpatient rehab setting.  Physiatrist is providing close team supervision and 24 hour management of active medical problems listed below.  Physiatrist and rehab team continue to assess  barriers to discharge/monitor patient progress toward functional and medical goals  Care Tool:  Bathing  Bathing activity did not occur:  (N/A) Body parts bathed by patient: Right arm, Left arm, Chest, Abdomen   Body parts bathed by helper: Right arm     Bathing assist Assist Level: Set up assist     Upper Body Dressing/Undressing Upper body dressing   What is the patient wearing?: Pull over shirt    Upper body assist Assist Level: Set up assist    Lower Body Dressing/Undressing Lower body dressing      What is the patient wearing?: Pants, Underwear/pull up     Lower body assist Assist for lower body dressing: Minimal Assistance - Patient > 75%     Toileting Toileting    Toileting assist Assist for toileting: Supervision/Verbal cueing Assistive Device Comment:  (Urinal)   Transfers Chair/bed transfer  Transfers assist     Chair/bed transfer assist level: Contact Guard/Touching assist     Locomotion Ambulation   Ambulation assist      Assist level: Minimal Assistance - Patient > 75% Assistive device:  (R rail) Max distance: 30 ft   Walk 10 feet activity   Assist     Assist level: Minimal Assistance - Patient > 75% Assistive device:  (R rail)   Walk 50 feet activity   Assist Walk 50 feet with 2 turns activity did not occur: Safety/medical concerns  Assist level: Minimal Assistance - Patient > 75% Assistive device: Walker-rolling    Walk 150 feet activity   Assist Walk 150 feet activity did not occur: Safety/medical concerns         Walk 10 feet  on uneven surface  activity   Assist Walk 10 feet on uneven surfaces activity did not occur: Safety/medical concerns         Wheelchair     Assist Will patient use wheelchair at discharge?: No (Per PT goals) Type of Wheelchair: Manual    Wheelchair assist level: Supervision/Verbal cueing Max wheelchair distance: 131ft    Wheelchair 50 feet with 2 turns  activity    Assist        Assist Level: Supervision/Verbal cueing   Wheelchair 150 feet activity     Assist      Assist Level: Supervision/Verbal cueing   Blood pressure (!) 154/80, pulse 77, temperature 99 F (37.2 C), resp. rate 18, height 5\' 4"  (1.626 m), weight 72.9 kg, SpO2 100 %.    Medical Problem List and Plan: 1.  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   Continue CIR  WHO/PRAFO nightly 2.  Antithrombotics: -DVT/anticoagulation: Lovenox- discontinued sine ambulating >150 feet.             -antiplatelet therapy: Aspirin 81 mg daily 3. Pain Management: Tylenol as needed. Well controlled.   4. Mood: Provide emotional support             -antipsychotic agents: N/A 5. Neuropsych: This patient is capable of making decisions on his own behalf. 6. Skin/Wound Care: Routine skin checks 7. Fluids/Electrolytes/Nutrition: Routine in and outs.   8.  Hypertension.    Lisinopril PTA, on hold due to AKI  Norvasc 10 mg daily  Lopressor 12.5 mg twice daily started on 9/21, increased to 50 twice daily on 9/24, increase to 75 on 9/29          Vitals:   05/06/20 1918 05/07/20 0334  BP: (!) 162/88 (!) 154/80  Pulse: 79 77  Resp: 14 18  Temp: 98.1 F (36.7 C) 99 F (37.2 C)  SpO2: 99% 100%   10/2: Elevated: increase lopressor to 100mg  BID.  9.  Diabetes mellitus with hyperglycemia.  Hemoglobin A1c 9.6.  Check blood sugars before meals and at bedtime.  Diabetic teaching             CBG (last 3)  Recent Labs    05/06/20 2113 05/07/20 0622 05/07/20 1124  GLUCAP 147* 131* 130*   Slightly labile on 10/1 10.  AKI on CKD stage III.  Creatinine baseline 2.42 08/02/2018.  Renal ultrasound 04/17/2019 showed no hydronephrosis or shadowing stone.  Started on sodium bicarbonate 1300 mg twice daily 04/17/2020.    Creatinine 3.26 on 9/28, labs ordered for Monday  EF  reviewed-60-65%  IVF nightly x3 nights ordered on 9/22 (complete)  -appreciate nephrology follow up 11.  Tobacco abuse.  Counsel 12.  Hyperlipidemia: Lipitor 13.  Slow transit constipation  Improving 14. Leukocytosis:   WBCs 10.0 on 9/29  Afebrile  Continue to monitor 15.  Acute blood loss anemia?  On chronic anemia  Hemoglobin 9.4 on 9/29 16.  Severe hypoalbuminemia  Supplement initiated on 9/21 17.  Thrombocytosis  Platelets 556 on 9/29, labs ordered for Monday  Continue to monitor  LOS: 15 days A FACE TO Edmunds Safiyyah Vasconez 05/07/2020, 2:41 PM

## 2020-05-08 ENCOUNTER — Inpatient Hospital Stay (HOSPITAL_COMMUNITY): Payer: Self-pay | Admitting: Physical Therapy

## 2020-05-08 LAB — GLUCOSE, CAPILLARY
Glucose-Capillary: 85 mg/dL (ref 70–99)
Glucose-Capillary: 154 mg/dL — ABNORMAL HIGH (ref 70–99)
Glucose-Capillary: 115 mg/dL — ABNORMAL HIGH (ref 70–99)
Glucose-Capillary: 246 mg/dL — ABNORMAL HIGH (ref 70–99)

## 2020-05-08 MED ORDER — INSULIN GLARGINE 100 UNIT/ML ~~LOC~~ SOLN
8.0000 [IU] | Freq: Every day | SUBCUTANEOUS | Status: DC
  Administered 2020-05-09 – 2020-05-10 (×2): 8 [IU] via SUBCUTANEOUS
  Filled 2020-05-08 (×2): qty 0.08

## 2020-05-08 NOTE — Progress Notes (Signed)
[facility] PHYSICAL MEDICINE & REHABILITATION PROGRESS NOTE   Subjective/Complaints: No complaints. Discussed elevated CBG last night- he ate Snickers bar. Gave extra 7U HS. In general, they have been slightly elevated. Will increase lantus to 8U  ROS: denies CP, SOB, N/V/D  Objective:   No results found. No results for input(s): WBC, HGB, HCT, PLT in the last 72 hours. No results for input(s): NA, K, CL, CO2, GLUCOSE, BUN, CREATININE, CALCIUM in the last 72 hours.  Intake/Output Summary (Last 24 hours) at 05/08/2020 1442 Last data filed at 05/08/2020 0900 Gross per 24 hour  Intake 240 ml  Output 1300 ml  Net -1060 ml        Physical Exam: Vital Signs Blood pressure (!) 151/83, pulse 77, temperature 99.1 F (37.3 C), resp. rate 14, height 5\' 4"  (1.626 m), weight 72.9 kg, SpO2 100 %.  General: Alert and oriented x 3, No apparent distress HEENT: Head is normocephalic, atraumatic, PERRLA, EOMI, sclera anicteric, oral mucosa pink and moist, dentition intact, ext ear canals clear,  Neck: Supple without JVD or lymphadenopathy Heart: Reg rate and rhythm. No murmurs rubs or gallops Chest: CTA bilaterally without wheezes, rales, or rhonchi; no distress Abdomen: Soft, non-tender, non-distended, bowel sounds positive. Extremities: No clubbing, cyanosis, or edema. Pulses are 2+ Skin: Warm and dry.  Intact. Psych: Normal mood.  Normal behavior. Musc: No edema in extremities.  No tenderness in extremities. Neuro: Alert Motor: RUE/RLE: 4-4+/5 proximal distal LUE: Shoulder abduction 2/5, distally 0/5, stable Left lower extremity: Hip flexion, knee extension 3/5, ankle dorsiflexion 0/5, stable     Assessment/Plan: 1. Functional deficits secondary to RIght corona radiata infarct which require 3+ hours per day of interdisciplinary therapy in a comprehensive inpatient rehab setting.  Physiatrist is providing close team supervision and 24 hour management of active medical problems listed  below.  Physiatrist and rehab team continue to assess barriers to discharge/monitor patient progress toward functional and medical goals  Care Tool:  Bathing  Bathing activity did not occur:  (N/A) Body parts bathed by patient: Right arm, Left arm, Chest, Abdomen   Body parts bathed by helper: Right arm     Bathing assist Assist Level: Set up assist     Upper Body Dressing/Undressing Upper body dressing   What is the patient wearing?: Pull over shirt    Upper body assist Assist Level: Set up assist    Lower Body Dressing/Undressing Lower body dressing      What is the patient wearing?: Pants, Underwear/pull up     Lower body assist Assist for lower body dressing: Minimal Assistance - Patient > 75%     Toileting Toileting    Toileting assist Assist for toileting: Supervision/Verbal cueing Assistive Device Comment:  (Urinal)   Transfers Chair/bed transfer  Transfers assist     Chair/bed transfer assist level: Minimal Assistance - Patient > 75% (squat pivot)     Locomotion Ambulation   Ambulation assist      Assist level: Minimal Assistance - Patient > 75% Assistive device: Walker-rolling Max distance: 54ft   Walk 10 feet activity   Assist     Assist level: Minimal Assistance - Patient > 75% Assistive device: Walker-rolling   Walk 50 feet activity   Assist Walk 50 feet with 2 turns activity did not occur: Safety/medical concerns  Assist level: Minimal Assistance - Patient > 75% Assistive device: Walker-rolling    Walk 150 feet activity   Assist Walk 150 feet activity did not occur: Safety/medical concerns  Walk 10 feet on uneven surface  activity   Assist Walk 10 feet on uneven surfaces activity did not occur: Safety/medical concerns         Wheelchair     Assist Will patient use wheelchair at discharge?: No (Per PT goals) Type of Wheelchair: Manual    Wheelchair assist level: Supervision/Verbal cueing Max  wheelchair distance: 179ft    Wheelchair 50 feet with 2 turns activity    Assist        Assist Level: Supervision/Verbal cueing   Wheelchair 150 feet activity     Assist      Assist Level: Supervision/Verbal cueing   Blood pressure (!) 151/83, pulse 77, temperature 99.1 F (37.3 C), resp. rate 14, height 5\' 4"  (1.626 m), weight 72.9 kg, SpO2 100 %.    Medical Problem List and Plan: 1.  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   Continue CIR  WHO/PRAFO nightly 2.  Antithrombotics: -DVT/anticoagulation: Lovenox- discontinued sine ambulating >150 feet.             -antiplatelet therapy: Aspirin 81 mg daily 3. Pain Management: Tylenol as needed. Well controlled 4. Mood: Provide emotional support             -antipsychotic agents: N/A 5. Neuropsych: This patient is capable of making decisions on his own behalf. 6. Skin/Wound Care: Routine skin checks 7. Fluids/Electrolytes/Nutrition: Routine in and outs.   8.  Hypertension.    Lisinopril PTA, on hold due to AKI  Norvasc 10 mg daily  Lopressor 12.5 mg twice daily started on 9/21, increased to 50 twice daily on 9/24, increase to 75 on 9/29          Vitals:   05/08/20 0453 05/08/20 1314  BP: (!) 152/69 (!) 151/83  Pulse: 76 77  Resp: 16 14  Temp: 99 F (37.2 C) 99.1 F (37.3 C)  SpO2: 100% 100%   10/2: Elevated: increase lopressor to 100mg  BID.  10/3: Continues to be elevated- monitor for effects of yesterday's medication increase  9.  Diabetes mellitus with hyperglycemia.  Hemoglobin A1c 9.6.  Check blood sugars before meals and at bedtime.  Diabetic teaching             CBG (last 3)  Recent Labs    05/07/20 2136 05/08/20 0641 05/08/20 1113  GLUCAP 407* 85 154*   10/3: large spike last night to 407 after eating Snickers bar (gave additional 7U last night). Overall slightly elevated- increase Lantus  to 8U.  10.  AKI on CKD stage III.  Creatinine baseline 2.42 08/02/2018.  Renal ultrasound 04/17/2019 showed no hydronephrosis or shadowing stone.  Started on sodium bicarbonate 1300 mg twice daily 04/17/2020.    Creatinine 3.26 on 9/28, labs ordered for Monday  EF reviewed-60-65%  IVF nightly x3 nights ordered on 9/22 (complete)  -appreciate nephrology follow up 11.  Tobacco abuse.  Counsel 12.  Hyperlipidemia: Lipitor 13.  Slow transit constipation  Improving 14. Leukocytosis:   WBCs 10.0 on 9/29  Afebrile  Continue to monitor 15.  Acute blood loss anemia?  On chronic anemia  Hemoglobin 9.4 on 9/29 16.  Severe hypoalbuminemia  Supplement initiated on 9/21 17.  Thrombocytosis  Platelets 556 on 9/29, labs ordered for Monday  Continue to monitor  LOS: 16 days A FACE TO [place] 05/08/2020, 2:42 PM

## 2020-05-08 NOTE — Plan of Care (Signed)
  Problem: RH BOWEL ELIMINATION Goal: RH STG MANAGE BOWEL WITH ASSISTANCE Description: STG Manage Bowel with supervision/min Assistance. Outcome: Progressing Goal: RH STG MANAGE BOWEL W/MEDICATION W/ASSISTANCE Description: STG Manage Bowel with Medication with supervision/min Assistance. Outcome: Progressing   Problem: RH SAFETY Goal: RH STG ADHERE TO SAFETY PRECAUTIONS W/ASSISTANCE/DEVICE Description: STG Adhere to Safety Precautions With supervision/min Assistance and appropriate assistive Device. Outcome: Progressing

## 2020-05-08 NOTE — Progress Notes (Signed)
Physical Therapy Session Note  Patient Details  Name: Tony Schmitt MRN: [id number] Date of Birth: 04/04/1971  Today's Date: 05/08/2020 PT Individual Time: 8527-7824 and 2353-6144 PT Individual Time Calculation (min): 46 min and 53 min  Short Term Goals: Week 3:  PT Short Term Goal 1 (Week 3): STG = LTG due to ELOS  Skilled Therapeutic Interventions/Progress Updates:    Session 1: Pt received asleep, supine in bed but upon awakening agreeable to therapy session. Supine>sitting L EOB, HOB slightly elevated and using bedrail, with min assist for trunk upright - pt demoing increased L LE hip/knee flexion movement to move LE towards EOB and active L shoulder abduction to place arm on bed to assist with trunk upright. Sitting EOB donned shoes max assist for time management. R squat pivot EOB>w/c with CGA/min assist for balance/pivoting and manual facilitation for L LE positioning prior to transfer.  Transported to/from gym in w/c for time management and energy conservation. Sit<>stands with CGA/min assist for steadying/balance - cuing for L hand placement on/off orthotic and fastening strap with R hand Gait training ~19ft using RW with min/mod assist for balance and therapist using boomwaker as external target/barrier to force increased L hip/knee flexion during swing with pt continuing to demonstrate ability to initiate hip/knee flexion and clear target though unable to perform knee extension at end phase of swing to land with heel on initial contact - therapist manually facilitating L hip anterior movement during L swing - demos more frequent L knee hyperextension during this ambulation trial compared to final walk yesterday - continues to have L hip externally rotated (though slightly improved when increased hip/knee flexion activated) and this limits pt's ability to sequence steps and turn when going to sit down. Sit>supine on mat table with CGA for trunk descent and min assist for L UE  management.  Supine bridging using ball squeeze to promote increased hip internal rotation activation x20 reps and cuing for increased muscle activation throughout with pt demoing good hip clearance. Supine hooklying L LE hip internal rotation against level 3 theraband resistance x8 reps with difficulty determining if pt compensating significantly with adductors. Sidelying reverse clamshells on R then L LE to learn movement to focus on hip internal rotators with pt unable to lift L foot despite mirror feedback and multimodal cuing though does demo slight activation/initiation of the movement. Supine>sit with min assist for trunk upright. R squat pivot to w/c with CGA/min assist - continued assist for L LE positioning. Transported back to room and pt agreeable to remain seated in w/c for a short duration - left with needs in reach and seat belt alarm on..  Session 2: Pt received supine in bed and agreeable to therapy session. Supine>sitting R EOB, HOB partially elevated, with supervision. Sitting EOB donned shoes max assist for time management. L squat pivot EOB>w/c with min assist for balance/pivoting hips.  Transported to/from gym in w/c for time management and energy conservation. L stand pivot w/c>EOM using RW with min assist for balance and therapist manually facilitating increased L hip internal rotation while turning to improve LE alignment. Standing with B UE support on RW performed R LE cross body foot taps on/off 4" step focusing on L LE weightbearing hip internal/external rotation via pelvic rotation - min assist for balance and manual facilitation for pelvic rotation. Sit>supine on mat with close supervision. Assessed L hip IR ROM with noted increased tightness/decreased range compared to R LE - performed L hip IR stretch 2 x1 minute.  Performed L ankle gastroc stretch x1 minute and L wrist extension ROM x10 reps. Supine>sitting L EOB with min assist for trunk upright - continued facilitation as  described above. Attempted standing pre-gait training using RW via having pt step over boomwacker then kick cone with L foot focusing on forced hip/knee flexion followed by knee extension; however, pt unable to perform this and starts poor motor planning of activating quads and hip flexors kicking leg straight forward or excessive hip hiking/circumduction via pelvic movement due to poor ability to activate hip/knee flexors simultaneously. Transitioned over to stairs to repeat L foot taps on/off 1st or 2nd step with R UE support on HR, min assist for balance, - pt continues to demo impaired motor planning activating quads instead of hamstrings. Performed seated L LE forward w/c propulsion to target hamstring activation and improve motor control then returned to stair L foot taps task with pt demoing some improved hamstring activation/motor control/motor planning but still not as well as yesterday. Transported back to room and left seated in w/c with needs in reach, L UE supported on pillow, seat belt alarm on, and meal tray set-up.  Therapy Documentation Precautions:  Precautions Precautions: Fall, Other (comment) Precaution Comments: left hemiparesis Restrictions Weight Bearing Restrictions: No  Pain: Session 1: Denies pain during session.  Session 2: Denies pain during session.  Therapy/Group: Individual Therapy  Tawana Scale , PT, DPT, CSRS  05/08/2020, 7:55 AM

## 2020-05-09 ENCOUNTER — Inpatient Hospital Stay (HOSPITAL_COMMUNITY): Payer: Self-pay

## 2020-05-09 ENCOUNTER — Encounter (HOSPITAL_COMMUNITY): Payer: Self-pay | Admitting: Occupational Therapy

## 2020-05-09 ENCOUNTER — Ambulatory Visit (HOSPITAL_COMMUNITY): Payer: Self-pay

## 2020-05-09 LAB — BASIC METABOLIC PANEL
Anion gap: 6 (ref 5–15)
BUN: 43 mg/dL — ABNORMAL HIGH (ref 6–20)
CO2: 28 mmol/L (ref 22–32)
Calcium: 9 mg/dL (ref 8.9–10.3)
Chloride: 103 mmol/L (ref 98–111)
Creatinine, Ser: 2.99 mg/dL — ABNORMAL HIGH (ref 0.61–1.24)
GFR calc Af Amer: 27 mL/min — ABNORMAL LOW (ref >60)
GFR calc non Af Amer: 23 mL/min — ABNORMAL LOW (ref >60)
Glucose, Bld: 145 mg/dL — ABNORMAL HIGH (ref 70–99)
Potassium: 3.9 mmol/L (ref 3.5–5.1)
Sodium: 137 mmol/L (ref 135–145)

## 2020-05-09 LAB — CBC WITH DIFFERENTIAL/PLATELET
Abs Immature Granulocytes: 0.17 10*3/uL — ABNORMAL HIGH (ref 0.00–0.07)
Basophils Absolute: 0.1 10*3/uL (ref 0.0–0.1)
Basophils Relative: 1 %
Eosinophils Absolute: 0.5 10*3/uL (ref 0.0–0.5)
Eosinophils Relative: 4 %
HCT: 28.1 % — ABNORMAL LOW (ref 39.0–52.0)
Hemoglobin: 8.9 g/dL — ABNORMAL LOW (ref 13.0–17.0)
Immature Granulocytes: 1 %
Lymphocytes Relative: 22 %
Lymphs Abs: 3 10*3/uL (ref 0.7–4.0)
MCH: 27.2 pg (ref 26.0–34.0)
MCHC: 31.7 g/dL (ref 30.0–36.0)
MCV: 85.9 fL (ref 80.0–100.0)
Monocytes Absolute: 0.9 10*3/uL (ref 0.1–1.0)
Monocytes Relative: 7 %
Neutro Abs: 8.7 10*3/uL — ABNORMAL HIGH (ref 1.7–7.7)
Neutrophils Relative %: 65 %
Platelets: 531 10*3/uL — ABNORMAL HIGH (ref 150–400)
RBC: 3.27 MIL/uL — ABNORMAL LOW (ref 4.22–5.81)
RDW: 13.9 % (ref 11.5–15.5)
WBC: 13.4 10*3/uL — ABNORMAL HIGH (ref 4.0–10.5)
nRBC: 0 % (ref 0.0–0.2)

## 2020-05-09 LAB — GLUCOSE, CAPILLARY
Glucose-Capillary: 140 mg/dL — ABNORMAL HIGH (ref 70–99)
Glucose-Capillary: 109 mg/dL — ABNORMAL HIGH (ref 70–99)
Glucose-Capillary: 167 mg/dL — ABNORMAL HIGH (ref 70–99)
Glucose-Capillary: 146 mg/dL — ABNORMAL HIGH (ref 70–99)

## 2020-05-09 NOTE — Progress Notes (Signed)
Occupational Therapy Session Note  Patient Details  Name: Tony Schmitt MRN: [id number] Date of Birth: 1971-04-25  Today's Date: 05/09/2020 OT Individual Time: 1300-1400 OT Individual Time Calculation (min): 60 min    Short Term Goals: Week 3:  OT Short Term Goal 1 (Week 3): STGs=LTGs due to ELOS  Skilled Therapeutic Interventions/Progress Updates:    Pt supine in bed, no c/o pain, pts mother present for family education this session.  Educated pts mother regarding functional transfer status and assistance techniques and body mechanics to include, supine to sit, squat pivot transfer EOB<>w/c and w/c<>TTB, and SPT w/c<>toilet using grab bar.  Discussed with mother regarding pt bathroom layout to ensure good carryover of transfer techniques.  Pts mother reports she plans to purchase TTB.  Pt completed all functional transfers with CGA from mother using good carryover of body mechanics while assisting pt.  Educated pts mother on donning/doffing technique of left RHS and wear schedule/precautions.  Pts mother demonstrated donning and doffing of splint independently.  Pt left with PT arriving.    Therapy Documentation Precautions:  Precautions Precautions: Fall, Other (comment) Precaution Comments: left hemiparesis Restrictions Weight Bearing Restrictions: No   Therapy/Group: Individual Therapy  [name] 05/09/2020, 4:01 PM

## 2020-05-09 NOTE — Progress Notes (Signed)
Physical Therapy Session Note  Patient Details  Name: Tony Schmitt MRN: [id number] Date of Birth: 1971-03-29  Today's Date: 05/09/2020 PT Individual Time: 0850-0940 PT Individual Time Calculation (min): 50 min   Short Term Goals: Week 2:  PT Short Term Goal 1 (Week 2): Pt will perform sit<>stand transfers with CGA and LRAD PT Short Term Goal 2 (Week 2): Pt will perform bed<>chair transfers with CGA and LRAD PT Short Term Goal 3 (Week 2): Pt will ambulate at least 135ft with minA and LRAD PT Short Term Goal 4 (Week 2): Pt will ascend/descend 4 steps with modA and LRAD Week 3:  PT Short Term Goal 1 (Week 3): STG = LTG due to ELOS  Skilled Therapeutic Interventions/Progress Updates:   Received pt sitting in [name] with RN present administering medications, pt agreeable to therapy, and denied any pain during session. Session with emphasis on functional mobility/transfers, generalized strengthening, dynamic standing balance/coordination, gait training, NMR and sequencing, and improved activity tolerance. Doffed bilateral non-skid socks and donned socks and tennis shoes with total A for time management purposes. Pt washed face sitting in WC at sink with supervision using R UE. Pt performed WC mobility 154ft using L hemi technique and supervision to dayroom. Pt transferred sit<>stand mod A without UE support with initial L lateral lean but able to self-correct and ambulated 74ft x 1 and 57ft x 1 with RW and min A. Pt with L knee flexed in initial stance transitioning to hyperextension. Pt required cues for knee control and to decrease cadence for improved sequencing. Noted pt with decreased L hip flexion and DF strength as pt compensating with mild hip hike/steppage gait pattern. Pt required cues for L UE management on RW hand orthosis. Pt performed bilateral LE strengthening on Nustep at workload 1 for 8.5 minutes for a total of 202 steps with emphasis on L hip adductor and quad strengthening. Pt with  increased difficulty maintaining L hip adduction resulting in excessive L hip abduction and ER. Tied orange TB around bilateral thighs to assist with L hip adduction and noted pt with improved positioning. Stand<>pivot Nustep<>WC with RW and min A. Pt transported back to room in [name] total A. Concluded session with pt sitting in WC, needs within reach, and seatbelt alarm on. L lap tray applied to [name] armrest for improved positoning.   Therapy Documentation Precautions:  Precautions Precautions: Fall, Other (comment) Precaution Comments: left hemiparesis Restrictions Weight Bearing Restrictions: No  Therapy/Group: Individual Therapy Alfonse Alpers PT, DPT   05/09/2020, 7:32 AM

## 2020-05-09 NOTE — Progress Notes (Signed)
Patient ID: Tony Schmitt, male   DOB: 02/26/1971, 49 y.o.   MRN: [id number]  Met with pt and Mom who is here for education which is going well. Mom to look for wheelchair and tub bench no need for 3 in 1. Mom has a walker at home already. Will try to get home health follow up via charity. Have given Mom [employer] and [employer] handouts for wheelchairs. She will look into this. Gave her name of med-assist Tina Bessant to follow up with regarding medicaid and SSD.  

## 2020-05-09 NOTE — Progress Notes (Signed)
Physical Therapy Session Note  Patient Details  Name: Tony Schmitt MRN: [id number] Date of Birth: August 19, 1970  Today's Date: 05/09/2020 PT Individual Time: 1100-1158 + 1400-1500 PT Individual Time Calculation (min): 58 min + 60 min  Short Term Goals: Week 3:  PT Short Term Goal 1 (Week 3): STG = LTG due to ELOS  Skilled Therapeutic Interventions/Progress Updates:   1st session:   Pt received sitting in w/c, sleeping but awakens easily to voice on arrival, agreeable to PT session. He denies any pain but reports moderate fatigue. Rest breaks provided during our session for energy recoveries. He self propelled himself from his room to dayroom gym (~122ft) with supervision. Performed gait training 59ft + 146ft (seated rest breaks) with minA and RW. With gait, he demo's L knee flexed in initial stance in efforts to reduce knee hyperextension and required frequent cueing for knee control and slowing gait speed/cadence to assist with improving. Towards end of 18ft, he had increased difficulty maintaining knee control and would often hyperextend. WC transport to ADL apartment room to focus on bed mobility and furniture transfers. He ambulated ~52ft with minA and RW on carpet in ADL room and performed bed mobility with supervision on regular, flat bed, including rolling L<>R and supine<>sit. He then ambulated an additional ~17ft with minA and RW on carpet to sofa couch. He required minA for controlled lowering to sofa and min/modA for sit<>stand from the sofa to RW. Ambulated the remaining ~43ft back to his w/c with minA and RW and taken back to his room with totalA for time management. Stand<>pivot with minA and no AD from his w/c to EOB (towards weaker L side) and sit>supine with supervision. Ended session semi-reclined in bed with needs in reach, bed alarm on, pt made comfortable.  2nd session: Pt received sitting in w/c, mother at bedside, pt agreeable to PT session and denies any pain. Focus of session  family ed to assist with preparing for safe DC home on 10/7. Lengthy discussion held with mother and patient regarding his current mobility status, home safety training, DME rec's, f/u therapies through [facility]-bono clinics such as [facility] and [employer]. Pt wheeled with [place] in w/c for time management during session. Performed stair training x4 steps (6inches) with minA and R HR support, increased difficulty with descent > ascent. After seated rest break, pt's mother performed hands-on training with patient as he navigated up/down x4 steps again with R HR and mother assisting, therapist providing close SBA; mother demonstrated appropriate guarding and hand placement for safety. Pt then performed car transfer with minA and no AD, mother still present for ed who was educated throughout on sequencing, technique, and guarding. She verbalized understanding and deferred hands-on training due to feeling comfortable. Pt then performed gait training x43ft with minA and RW, mother still present for guarding techniques, RW management, and fall prevention. Reinforced importance of CGA if he were to ambulate at home, she verbalized understanding. Pt returned to his room and remained seated in w/c at end of session, belt alarm on, mother at bedside, needs in reach. Mother states that she plans on getting a HR installed at the steps and will order needed DME through [place] this week. All questions/concerns addressed.  Therapy Documentation Precautions:  Precautions Precautions: Fall, Other (comment) Precaution Comments: left hemiparesis Restrictions Weight Bearing Restrictions: No  Therapy/Group: Individual Therapy  Lekeith Wulf P Rivers [place] PT 05/09/2020, 12:40 PM

## 2020-05-09 NOTE — Progress Notes (Signed)
[facility] PHYSICAL MEDICINE & REHABILITATION PROGRESS NOTE   Subjective/Complaints: Patient seen sitting up this AM.  He states he slept well overnight.  He states he had a good weekend.  ROS: Denies CP, SOB, N/V/D  Objective:   No results found. Recent Labs    05/09/20 0628  WBC 13.4*  HGB 8.9*  HCT 28.1*  PLT 531*   Recent Labs    05/09/20 0628  NA 137  K 3.9  CL 103  CO2 28  GLUCOSE 145*  BUN 43*  CREATININE 2.99*  CALCIUM 9.0    Intake/Output Summary (Last 24 hours) at 05/09/2020 1505 Last data filed at 05/09/2020 1300 Gross per 24 hour  Intake 480 ml  Output 900 ml  Net -420 ml        Physical Exam: Vital Signs Blood pressure 133/78, pulse 72, temperature 98.5 F (36.9 C), resp. rate 18, height 5\' 4"  (1.626 m), weight 72.9 kg, SpO2 100 %.  Constitutional: No distress . Vital signs reviewed. HENT: Normocephalic.  Atraumatic. Eyes: EOMI. No discharge. Cardiovascular: No JVD.  RRR. Respiratory: Normal effort.  No stridor.  Bilateral clear to auscultation. GI: Non-distended.  BS +. Skin: Warm and dry.  Intact. Psych: Normal mood.  Normal behavior. Musc: No edema in extremities.  No tenderness in extremities. Neuro: Alert Motor: RUE/RLE: 4-4+/5 proximal distal LUE: Shoulder abduction 2/5, distally 0/5, stable Left lower extremity: Hip flexion, knee extension 3/5, ankle dorsiflexion 0/5, unchanged  Assessment/Plan: 1. Functional deficits secondary to RIght corona radiata infarct which require 3+ hours per day of interdisciplinary therapy in a comprehensive inpatient rehab setting.  Physiatrist is providing close team supervision and 24 hour management of active medical problems listed below.  Physiatrist and rehab team continue to assess barriers to discharge/monitor patient progress toward functional and medical goals  Care Tool:  Bathing  Bathing activity did not occur:  (N/A) Body parts bathed by patient: Right arm, Left arm, Chest, Abdomen    Body parts bathed by helper: Right arm     Bathing assist Assist Level: Set up assist     Upper Body Dressing/Undressing Upper body dressing   What is the patient wearing?: Pull over shirt    Upper body assist Assist Level: Set up assist    Lower Body Dressing/Undressing Lower body dressing      What is the patient wearing?: Pants, Underwear/pull up     Lower body assist Assist for lower body dressing: Minimal Assistance - Patient > 75%     Toileting Toileting    Toileting assist Assist for toileting: Supervision/Verbal cueing Assistive Device Comment:  (Urinal)   Transfers Chair/bed transfer  Transfers assist     Chair/bed transfer assist level: Minimal Assistance - Patient > 75%     Locomotion Ambulation   Ambulation assist      Assist level: Minimal Assistance - Patient > 75% Assistive device: Walker-rolling Max distance: 30ft   Walk 10 feet activity   Assist     Assist level: Minimal Assistance - Patient > 75% Assistive device: Walker-rolling   Walk 50 feet activity   Assist Walk 50 feet with 2 turns activity did not occur: Safety/medical concerns  Assist level: Minimal Assistance - Patient > 75% Assistive device: Walker-rolling    Walk 150 feet activity   Assist Walk 150 feet activity did not occur: Safety/medical concerns         Walk 10 feet on uneven surface  activity   Assist Walk 10 feet on uneven surfaces  activity did not occur: Safety/medical concerns         Wheelchair     Assist Will patient use wheelchair at discharge?: No Type of Wheelchair: Manual    Wheelchair assist level: Supervision/Verbal cueing Max wheelchair distance: 141ft    Wheelchair 50 feet with 2 turns activity    Assist        Assist Level: Supervision/Verbal cueing   Wheelchair 150 feet activity     Assist      Assist Level: Supervision/Verbal cueing   Blood pressure 133/78, pulse 72, temperature 98.5 F (36.9 C),  resp. rate 18, height 5\' 4"  (1.626 m), weight 72.9 kg, SpO2 100 %.    Medical Problem List and Plan: 1.  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   Continue CIR  WHO/PRAFO nightly 2.  Antithrombotics: -DVT/anticoagulation: Lovenox- discontinued sine ambulating >150 feet.             -antiplatelet therapy: Aspirin 81 mg daily 3. Pain Management: Tylenol as needed.  4. Mood: Provide emotional support             -antipsychotic agents: N/A 5. Neuropsych: This patient is capable of making decisions on his own behalf. 6. Skin/Wound Care: Routine skin checks 7. Fluids/Electrolytes/Nutrition: Routine in and outs.   8.  Hypertension.    Lisinopril PTA, on hold due to AKI  Norvasc 10 mg daily  Lopressor 12.5 mg twice daily started on 9/21, increased to 50 twice daily on 9/24, increase to 75 on 9/29, increased to 100 BID on 10/2  Elevated, but ?improving on 10/4         Vitals:   05/09/20 0333 05/09/20 1409  BP: (!) 154/86 133/78  Pulse: 74 72  Resp:  18  Temp:  98.5 F (36.9 C)  SpO2:  100%   9.  Diabetes mellitus with hyperglycemia.  Hemoglobin A1c 9.6.  Check blood sugars before meals and at bedtime.  Diabetic teaching  Lantus increased to 8            CBG (last 3)  Recent Labs    05/08/20 2123 05/09/20 0603 05/09/20 1204  GLUCAP 246* 140* 109*   Lantus 8  Labile on 10/4, monitor for trending 10.  AKI on CKD stage III.  Creatinine baseline 2.42 08/02/2018.  Renal ultrasound 04/17/2019 showed no hydronephrosis or shadowing stone.  Started on sodium bicarbonate 1300 mg twice daily 04/17/2020.    Creatinine 2.99 on 10/4  EF reviewed-60-65%  IVF nightly x3 nights ordered on 9/22 (complete)  -appreciate nephrology follow up 11.  Tobacco abuse.  Counsel 12.  Hyperlipidemia: Lipitor 13.  Slow transit constipation  Improved 14. Leukocytosis:   WBCs 13.4 on 10/4, labs ordered  for tomorrow  Afebrile  Continue to monitor 15.  Acute blood loss anemia?  On chronic anemia  Hemoglobin 8.9 on 10/4, labs ordered for tomorrow 16.  Severe hypoalbuminemia  Supplement initiated on 9/21 17.  Thrombocytosis  Platelets 531 on 10/4  Continue to monitor  LOS: 17 days A FACE TO FACE EVALUATION WAS PERFORMED  Sukhman Kocher [name] 05/09/2020, 3:05 PM

## 2020-05-10 ENCOUNTER — Inpatient Hospital Stay (HOSPITAL_COMMUNITY): Payer: Self-pay

## 2020-05-10 ENCOUNTER — Inpatient Hospital Stay (HOSPITAL_COMMUNITY): Payer: Self-pay | Admitting: Occupational Therapy

## 2020-05-10 LAB — CBC WITH DIFFERENTIAL/PLATELET
Abs Immature Granulocytes: 0.13 10*3/uL — ABNORMAL HIGH (ref 0.00–0.07)
Basophils Absolute: 0.1 10*3/uL (ref 0.0–0.1)
Basophils Relative: 1 %
Eosinophils Absolute: 0.5 10*3/uL (ref 0.0–0.5)
Eosinophils Relative: 5 %
HCT: 28.8 % — ABNORMAL LOW (ref 39.0–52.0)
Hemoglobin: 9.1 g/dL — ABNORMAL LOW (ref 13.0–17.0)
Immature Granulocytes: 1 %
Lymphocytes Relative: 25 %
Lymphs Abs: 2.9 10*3/uL (ref 0.7–4.0)
MCH: 26.8 pg (ref 26.0–34.0)
MCHC: 31.6 g/dL (ref 30.0–36.0)
MCV: 85 fL (ref 80.0–100.0)
Monocytes Absolute: 0.8 10*3/uL (ref 0.1–1.0)
Monocytes Relative: 7 %
Neutro Abs: 7.1 10*3/uL (ref 1.7–7.7)
Neutrophils Relative %: 61 %
Platelets: 556 10*3/uL — ABNORMAL HIGH (ref 150–400)
RBC: 3.39 MIL/uL — ABNORMAL LOW (ref 4.22–5.81)
RDW: 13.9 % (ref 11.5–15.5)
WBC: 11.4 10*3/uL — ABNORMAL HIGH (ref 4.0–10.5)
nRBC: 0 % (ref 0.0–0.2)

## 2020-05-10 LAB — GLUCOSE, CAPILLARY
Glucose-Capillary: 147 mg/dL — ABNORMAL HIGH (ref 70–99)
Glucose-Capillary: 145 mg/dL — ABNORMAL HIGH (ref 70–99)
Glucose-Capillary: 104 mg/dL — ABNORMAL HIGH (ref 70–99)
Glucose-Capillary: 204 mg/dL — ABNORMAL HIGH (ref 70–99)

## 2020-05-10 MED ORDER — HYDRALAZINE HCL 10 MG PO TABS
10.0000 mg | ORAL_TABLET | Freq: Three times a day (TID) | ORAL | Status: DC
  Administered 2020-05-10 – 2020-05-12 (×6): 10 mg via ORAL
  Filled 2020-05-10 (×5): qty 1

## 2020-05-10 MED ORDER — INSULIN GLARGINE 100 UNIT/ML ~~LOC~~ SOLN
10.0000 [IU] | Freq: Every day | SUBCUTANEOUS | Status: DC
  Administered 2020-05-11 – 2020-05-12 (×2): 10 [IU] via SUBCUTANEOUS
  Filled 2020-05-10 (×2): qty 0.1

## 2020-05-10 NOTE — Progress Notes (Signed)
Occupational Therapy Session Note  Patient Details  Name: Tony Schmitt MRN: [id number] Date of Birth: Sep 03, 1970  Today's Date: 05/10/2020 OT Individual Time: 7939-0300 OT Individual Time Calculation (min): 75 min    Short Term Goals: Week 3:  OT Short Term Goal 1 (Week 3): STGs=LTGs due to ELOS  Skilled Therapeutic Interventions/Progress Updates:    Pt supine in bed, no c/o pain, agreeable to OT session.  Pt completed supine to sit with CGA, squat pivot transfer with CGA and occasional VC for proper w/c positioning. OT session focused on reinforcing LB dressing adaptive and compensatory techniques to increase pt independence. Pt donned/doffed bilateral shoes sitting in w/c with increased time and setup.  Pt donned/doffed socks with supervision for occasional VCs to initiate compensatory techniques x 2 trials seated in w/c. Significant increased time needed first trial, and reduced amount of time and less VCs needed second trial.  OT also educated pt on use of dycem to reduce LLE slipping off during figure 4 positioning and this also increased pts ease and reduced time needed.  Pt self propelled to bathroom with min assist over steep threshold and donned/doffed pants standing at grab bar.  Pt needed increased time to thread over bilateral feet and supervision during standing to pull over hips.  Pt requesting to return back to bed.  Min assist needed for squat pivot transfer w/c to EOB due to pt initially trying to stand needing VCs for safe technique. Supervision sit to supine.  Call bell in reach, bed alarm on.    Therapy Documentation Precautions:  Precautions Precautions: Fall, Other (comment) Precaution Comments: left hemiparesis Restrictions Weight Bearing Restrictions: No   Therapy/Group: Individual Therapy  [name] 05/10/2020, 4:38 PM

## 2020-05-10 NOTE — Progress Notes (Signed)
Physical Therapy Session Note  Patient Details  Name: Tony Schmitt MRN: [id number] Date of Birth: 1971/01/09  Today's Date: 05/10/2020 PT Individual Time: 1030-1125 PT Individual Time Calculation (min): 55 min   Short Term Goals: Week 3:  PT Short Term Goal 1 (Week 3): STG = LTG due to ELOS  Skilled Therapeutic Interventions/Progress Updates:    Pt received supine in bed, sleeping on arrival but agreeable to PT session; denies any pain but reports moderate fatigue. Supine<>sit with CGA with HOB slightly elevated, use of bed rail. Able to sit EOB with CGA, and performed squat<>pivot from EOB to w/c with CGA towards stronger R side. Donned shoes with totalA for time management. He self propelled himself using hemi-technique with distant supervision in w/c on level surfaces in hallway, >167ft. Performed repeated sit<>stands with minA and no AD from w/c, therapist facilitating L knee control and cues for controlled lowering; note he will rely on back of his legs for assisting with control during transfers. Performed gait training x105ft with minA and RW with RW hand splint, therapist assisting with RW management, preventing L knee hyperextension in stance, and cadence. Pt able to correct knee hyperextension ~50% of the time. After seated rest, performed stand step pivot with minA from w/c to Nustep and performed Nustep at workload 1 using only BLE's for hip and knee extension for x6 minutes, therapist assisting L knee control. Pt then reporting urgent need for BM, therefore ended Nustep and stand<>pivot with minA back to w/c. WC transport for time management back to his room where he performed stand<>pivot with minA from w/c to toilet, using grab bars in bathroom. He reported needing extra time for completing BM; instructed patient on pull cord for nurse assistance when complete, he voiced understanding. Ended session seated on toilet.  Therapy Documentation Precautions:  Precautions Precautions: Fall,  Other (comment) Precaution Comments: left hemiparesis Restrictions Weight Bearing Restrictions: No  Therapy/Group: Individual Therapy  Syna Gad P Saidi Santacroce PT 05/10/2020, 10:52 AM

## 2020-05-10 NOTE — Discharge Summary (Addendum)
Physician Discharge Summary  Patient ID: Tony Schmitt MRN: [id number] DOB/AGE: [birth year] 49 y.o.  Admit date: 04/22/2020 Discharge date: 05/12/2020  Discharge Diagnoses:  Principal Problem:   CVA (cerebral vascular accident) [facility]) Active Problems:   Hypoalbuminemia due to protein-calorie malnutrition ([place])   Acute blood loss anemia   Leukocytosis   Slow transit constipation   AKI (acute kidney injury) ([place])   Diabetes mellitus type 2 with complications, uncontrolled ([place])   Benign essential HTN   Thrombocytosis Tobacco abuse Hyperlipidemia Medical noncompliance  Discharged Condition: Stable  Significant Diagnostic Studies: CT HEAD WO CONTRAST  Result Date: 04/16/2020 CLINICAL DATA:  Acute onset left-sided weakness for 1 day. Suspected stroke. EXAM: CT HEAD WITHOUT CONTRAST TECHNIQUE: Contiguous axial images were obtained from the base of the skull through the vertex without intravenous contrast. COMPARISON:  None. FINDINGS: Brain: Low-attenuation change in the right periventricular white matter suggesting acute infarct or possibly a mass lesion. Consider MRI for further evaluation. Old lacunar infarcts suggested in the left basal ganglia. No mass effect or midline shift. No abnormal extra-axial fluid collections. Gray-white matter junctions are distinct. Basal cisterns are not effaced. No acute intracranial hemorrhage. Vascular: No hyperdense vessel or unexpected calcification. Skull: Calvarium appears intact. Sinuses/Orbits: Paranasal sinuses are clear. Hypoaeration of the mastoid air cells with small mastoid effusions. Mucosal thickening in the right middle ear possibly representing inflammatory process or cholesteatoma. Other: None. IMPRESSION: 1. Low-attenuation change in the right periventricular white matter suggesting acute infarct or possibly a mass lesion. Consider MRI for further evaluation. 2. Old lacunar infarcts in the left basal ganglia. 3. No acute intracranial hemorrhage.  4. Hypoaeration of the mastoid air cells with small mastoid effusions. Mucosal thickening in the right middle ear possibly representing inflammatory process or cholesteatoma. Electronically Signed   By: Lucienne Capers M.D.   On: 04/16/2020 01:02   MR ANGIO HEAD WO CONTRAST  Result Date: 04/16/2020 CLINICAL DATA:  New onset of left upper extremity weakness. Stroke symptoms have progressed significantly overnight. EXAM: MRI HEAD WITHOUT CONTRAST MRA HEAD WITHOUT CONTRAST MRA NECK WITHOUT CONTRAST TECHNIQUE: Multiplanar, multiecho pulse sequences of the brain and surrounding structures were obtained without intravenous contrast. Angiographic images of the Circle of Willis were obtained using MRA technique without intravenous contrast. Angiographic images of the neck were obtained using MRA technique without intravenous contrast. Carotid stenosis measurements (when applicable) are obtained utilizing NASCET criteria, using the distal internal carotid diameter as the denominator. COMPARISON:  CT head without contrast 04/16/2020 FINDINGS: MRI HEAD FINDINGS Brain: The diffusion-weighted images confirm acute/subacute nonhemorrhagic infarcts involving the posterior right corona radiata. White matter infarct in the anterior aspect of the left external capsule, just anterior to the lentiform nucleus measures up to 13 mm. Punctate cortical infarct is present along the inferior left frontal operculum. T2 signal changes are associated with each of these areas. More remote lacunar infarct is present at the left globus pallidus. The basal ganglia are otherwise intact Periventricular and subcortical T2 signal changes are otherwise mildly advanced for age. The internal auditory canals are within normal limits. The brainstem and cerebellum are within normal limits. Vascular: The distal left vertebral artery is not visualized. Flow is otherwise present in the major intracranial arteries. Skull and upper cervical spine:  Craniocervical junction is normal. Flow is present in the vertebral arteries bilaterally. Visualized intracranial contents are normal. Sinuses/Orbits: Right greater than left mastoid effusion is present. The paranasal sinuses and mastoid air cells are otherwise clear. The globes and orbits are within  normal limits. MRA HEAD FINDINGS Atherosclerotic irregularity is present in the cavernous internal carotid arteries bilaterally. 50-60% stenosis is present in the supraclinoid right ICA. No other significant stenoses are present through the ICA termini. The anterior communicating artery is patent. MCA bifurcations are intact. Asymmetric attenuation of distal left MCA branch vessels is noted without a significant proximal stenosis or occlusion. No flow signal is present in the left vertebral artery. Moderate to high-grade stenosis is present at the junction at the V3 and V4 segments with segmental narrowing of the V4 segment above this level. Moderate proximal basilar artery stenosis is present. Basilar artery is small, terminating at the superior cerebellar arteries. Both posterior cerebral arteries are of fetal type. Moderate stenoses are present in the right posterior communicating artery with asymmetric attenuation of distal PCA branch vessels on the right. MRA NECK FINDINGS Time of flight imaging demonstrates common origin of the left common carotid artery and innominate artery. No significant proximal stenoses are present. MCA bifurcations are intact without significant flow disturbance. Flow is antegrade in the vertebral arteries bilaterally. The right vertebral artery is the dominant vessel. No flow is present in the distal left vertebral artery above the craniocervical junction. IMPRESSION: 1. Acute/subacute nonhemorrhagic infarcts involving the posterior right corona radiata and anterior aspect of the left external capsule, corresponding with patient's symptoms. Right-sided infarcts likely impacts cortical  spinal tracts involving left motor function. 2. Punctate cortical infarct along the inferior left frontal operculum. 3. Periventricular and subcortical T2 signal changes are otherwise mildly advanced for age. This likely reflects the sequela of chronic microvascular ischemia. 4. No flow signal in the distal left vertebral artery above the craniocervical junction. 5. Moderate to high-grade stenosis at the junction at the right V3 and V4 segments with segmental narrowing of the V4 segment above this level. 6. Moderate proximal basilar artery stenosis. 7. Atherosclerotic irregularity in the cavernous internal carotid arteries bilaterally with a 50-60% stenosis in the supraclinoid right ICA. 8. Moderate stenoses of the right posterior communicating artery with asymmetric attenuation of distal PCA branch vessels on the right. The right posterior cerebral artery is of fetal type. 9. No significant proximal stenosis in the neck. Electronically Signed   By: San Morelle M.D.   On: 04/16/2020 12:19   MR ANGIO NECK WO CONTRAST  Result Date: 04/16/2020 CLINICAL DATA:  New onset of left upper extremity weakness. Stroke symptoms have progressed significantly overnight. EXAM: MRI HEAD WITHOUT CONTRAST MRA HEAD WITHOUT CONTRAST MRA NECK WITHOUT CONTRAST TECHNIQUE: Multiplanar, multiecho pulse sequences of the brain and surrounding structures were obtained without intravenous contrast. Angiographic images of the Circle of Willis were obtained using MRA technique without intravenous contrast. Angiographic images of the neck were obtained using MRA technique without intravenous contrast. Carotid stenosis measurements (when applicable) are obtained utilizing NASCET criteria, using the distal internal carotid diameter as the denominator. COMPARISON:  CT head without contrast 04/16/2020 FINDINGS: MRI HEAD FINDINGS Brain: The diffusion-weighted images confirm acute/subacute nonhemorrhagic infarcts involving the posterior  right corona radiata. White matter infarct in the anterior aspect of the left external capsule, just anterior to the lentiform nucleus measures up to 13 mm. Punctate cortical infarct is present along the inferior left frontal operculum. T2 signal changes are associated with each of these areas. More remote lacunar infarct is present at the left globus pallidus. The basal ganglia are otherwise intact Periventricular and subcortical T2 signal changes are otherwise mildly advanced for age. The internal auditory canals are within normal limits. The brainstem  and cerebellum are within normal limits. Vascular: The distal left vertebral artery is not visualized. Flow is otherwise present in the major intracranial arteries. Skull and upper cervical spine: Craniocervical junction is normal. Flow is present in the vertebral arteries bilaterally. Visualized intracranial contents are normal. Sinuses/Orbits: Right greater than left mastoid effusion is present. The paranasal sinuses and mastoid air cells are otherwise clear. The globes and orbits are within normal limits. MRA HEAD FINDINGS Atherosclerotic irregularity is present in the cavernous internal carotid arteries bilaterally. 50-60% stenosis is present in the supraclinoid right ICA. No other significant stenoses are present through the ICA termini. The anterior communicating artery is patent. MCA bifurcations are intact. Asymmetric attenuation of distal left MCA branch vessels is noted without a significant proximal stenosis or occlusion. No flow signal is present in the left vertebral artery. Moderate to high-grade stenosis is present at the junction at the V3 and V4 segments with segmental narrowing of the V4 segment above this level. Moderate proximal basilar artery stenosis is present. Basilar artery is small, terminating at the superior cerebellar arteries. Both posterior cerebral arteries are of fetal type. Moderate stenoses are present in the right posterior  communicating artery with asymmetric attenuation of distal PCA branch vessels on the right. MRA NECK FINDINGS Time of flight imaging demonstrates common origin of the left common carotid artery and innominate artery. No significant proximal stenoses are present. MCA bifurcations are intact without significant flow disturbance. Flow is antegrade in the vertebral arteries bilaterally. The right vertebral artery is the dominant vessel. No flow is present in the distal left vertebral artery above the craniocervical junction. IMPRESSION: 1. Acute/subacute nonhemorrhagic infarcts involving the posterior right corona radiata and anterior aspect of the left external capsule, corresponding with patient's symptoms. Right-sided infarcts likely impacts cortical spinal tracts involving left motor function. 2. Punctate cortical infarct along the inferior left frontal operculum. 3. Periventricular and subcortical T2 signal changes are otherwise mildly advanced for age. This likely reflects the sequela of chronic microvascular ischemia. 4. No flow signal in the distal left vertebral artery above the craniocervical junction. 5. Moderate to high-grade stenosis at the junction at the right V3 and V4 segments with segmental narrowing of the V4 segment above this level. 6. Moderate proximal basilar artery stenosis. 7. Atherosclerotic irregularity in the cavernous internal carotid arteries bilaterally with a 50-60% stenosis in the supraclinoid right ICA. 8. Moderate stenoses of the right posterior communicating artery with asymmetric attenuation of distal PCA branch vessels on the right. The right posterior cerebral artery is of fetal type. 9. No significant proximal stenosis in the neck. Electronically Signed   By: San Morelle M.D.   On: 04/16/2020 12:19   MR Brain Wo Contrast (neuro protocol)  Result Date: 04/16/2020 CLINICAL DATA:  New onset of left upper extremity weakness. Stroke symptoms have progressed significantly  overnight. EXAM: MRI HEAD WITHOUT CONTRAST MRA HEAD WITHOUT CONTRAST MRA NECK WITHOUT CONTRAST TECHNIQUE: Multiplanar, multiecho pulse sequences of the brain and surrounding structures were obtained without intravenous contrast. Angiographic images of the Circle of Willis were obtained using MRA technique without intravenous contrast. Angiographic images of the neck were obtained using MRA technique without intravenous contrast. Carotid stenosis measurements (when applicable) are obtained utilizing NASCET criteria, using the distal internal carotid diameter as the denominator. COMPARISON:  CT head without contrast 04/16/2020 FINDINGS: MRI HEAD FINDINGS Brain: The diffusion-weighted images confirm acute/subacute nonhemorrhagic infarcts involving the posterior right corona radiata. White matter infarct in the anterior aspect of the left external  capsule, just anterior to the lentiform nucleus measures up to 13 mm. Punctate cortical infarct is present along the inferior left frontal operculum. T2 signal changes are associated with each of these areas. More remote lacunar infarct is present at the left globus pallidus. The basal ganglia are otherwise intact Periventricular and subcortical T2 signal changes are otherwise mildly advanced for age. The internal auditory canals are within normal limits. The brainstem and cerebellum are within normal limits. Vascular: The distal left vertebral artery is not visualized. Flow is otherwise present in the major intracranial arteries. Skull and upper cervical spine: Craniocervical junction is normal. Flow is present in the vertebral arteries bilaterally. Visualized intracranial contents are normal. Sinuses/Orbits: Right greater than left mastoid effusion is present. The paranasal sinuses and mastoid air cells are otherwise clear. The globes and orbits are within normal limits. MRA HEAD FINDINGS Atherosclerotic irregularity is present in the cavernous internal carotid arteries  bilaterally. 50-60% stenosis is present in the supraclinoid right ICA. No other significant stenoses are present through the ICA termini. The anterior communicating artery is patent. MCA bifurcations are intact. Asymmetric attenuation of distal left MCA branch vessels is noted without a significant proximal stenosis or occlusion. No flow signal is present in the left vertebral artery. Moderate to high-grade stenosis is present at the junction at the V3 and V4 segments with segmental narrowing of the V4 segment above this level. Moderate proximal basilar artery stenosis is present. Basilar artery is small, terminating at the superior cerebellar arteries. Both posterior cerebral arteries are of fetal type. Moderate stenoses are present in the right posterior communicating artery with asymmetric attenuation of distal PCA branch vessels on the right. MRA NECK FINDINGS Time of flight imaging demonstrates common origin of the left common carotid artery and innominate artery. No significant proximal stenoses are present. MCA bifurcations are intact without significant flow disturbance. Flow is antegrade in the vertebral arteries bilaterally. The right vertebral artery is the dominant vessel. No flow is present in the distal left vertebral artery above the craniocervical junction. IMPRESSION: 1. Acute/subacute nonhemorrhagic infarcts involving the posterior right corona radiata and anterior aspect of the left external capsule, corresponding with patient's symptoms. Right-sided infarcts likely impacts cortical spinal tracts involving left motor function. 2. Punctate cortical infarct along the inferior left frontal operculum. 3. Periventricular and subcortical T2 signal changes are otherwise mildly advanced for age. This likely reflects the sequela of chronic microvascular ischemia. 4. No flow signal in the distal left vertebral artery above the craniocervical junction. 5. Moderate to high-grade stenosis at the junction at  the right V3 and V4 segments with segmental narrowing of the V4 segment above this level. 6. Moderate proximal basilar artery stenosis. 7. Atherosclerotic irregularity in the cavernous internal carotid arteries bilaterally with a 50-60% stenosis in the supraclinoid right ICA. 8. Moderate stenoses of the right posterior communicating artery with asymmetric attenuation of distal PCA branch vessels on the right. The right posterior cerebral artery is of fetal type. 9. No significant proximal stenosis in the neck. Electronically Signed   By: San Morelle M.D.   On: 04/16/2020 12:19   US RENAL  Result Date: 04/26/2020 CLINICAL DATA:  Acute kidney injury EXAM: RENAL / URINARY TRACT ULTRASOUND COMPLETE COMPARISON:  None. FINDINGS: Right Kidney: Renal measurements: 11.6 x 5 x 5.5 cm = volume: 169 mL. There is no hydronephrosis. There is a 1.6 cm cyst arising from the lower pole. Left Kidney: Renal measurements: 10.9 x 5.7 x 4.1 cm = volume: Is 134  mL. There is a 1.3 cm cyst arising from the lower pole. Bladder: Appears normal for degree of bladder distention. Other: None. IMPRESSION: Unremarkable exam.  No hydronephrosis. Electronically Signed   By: Constance Holster M.D.   On: 04/26/2020 21:27   VAS [place] TRANSCRANIAL DOPPLER W BUBBLES  Result Date: 04/19/2020  Transcranial Doppler with Bubble Indications: Stroke. Performing Technologist: Abram Sander RVS  Examination Guidelines: A complete evaluation includes B-mode imaging, spectral Doppler, color Doppler, and power Doppler as needed of all accessible portions of each vessel. Bilateral testing is considered an integral part of a complete examination. Limited examinations for reoccurring indications may be performed as noted.  Summary: No HITS at rest or during Valsalva. Negative transcranial Doppler Bubble study with no evidence of right to left intracardiac communication.  A vascular evaluation was performed. The right middle cerebral artery was studied.  An IV was inserted into the patient's right forearm . Verbal informed consent was obtained.  Negative TCD Bubble study *See table(s) above for TCD measurements and observations.  Diagnosing physician: Antony Contras MD Electronically signed by Antony Contras MD on 04/19/2020 at 12:33:39 PM.    Final    ECHO TEE  Result Date: 04/19/2020    TRANSESOPHOGEAL ECHO REPORT   Patient Name:   Tony Schmitt Date of Exam: 04/18/2020 Medical Rec #:  [id number]     Height:       64.0 in Accession #:    [id number]    Weight:       161.4 lb Date of Birth:  [birth year]     BSA:          1.786 m Patient Age:    22 years      BP:           124/69 mmHg Patient Gender: M             HR:           88 bpm. Exam Location:  Inpatient Procedure: Transesophageal Echo Indications:    stroke 434.9`  History:        Patient has prior history of Echocardiogram examinations, most                 recent 04/16/2020. Arrythmias:PVC; Risk Factors:Hypertension,                 Diabetes and Former Smoker.  Sonographer:    Jannett Celestine RDCS (AE) Referring Phys: [id number] ANGELA NICOLE DUKE PROCEDURE: The transesophogeal probe was passed without difficulty through the esophogus of the patient. Sedation performed by different physician. The patient's vital signs; including heart rate, blood pressure, and oxygen saturation; remained stable throughout the procedure. The patient developed no complications during the procedure. IMPRESSIONS  1. Left ventricular ejection fraction, by estimation, is 60 to 65%. The left ventricle has normal function. The left ventricle has no regional wall motion abnormalities.  2. Right ventricular systolic function is normal. The right ventricular size is normal.  3. No left atrial/left atrial appendage thrombus was detected.  4. The mitral valve is normal in structure. Trivial mitral valve regurgitation. No evidence of mitral stenosis.  5. The aortic valve is normal in structure. Aortic valve regurgitation is not visualized. No  aortic stenosis is present.  6. The inferior vena cava is normal in size with greater than 50% respiratory variability, suggesting  7. Possible very small PFO noted with agitated saline contrast study with late bubbles after 7 cardiac cycles and Valsalva. Conclusion(s)/Recommendation(s): Normal biventricular function without  evidence of hemodynamically significant valvular heart disease. FINDINGS  Left Ventricle: Left ventricular ejection fraction, by estimation, is 60 to 65%. The left ventricle has normal function. The left ventricle has no regional wall motion abnormalities. The left ventricular internal cavity size was normal in size. There is  no left ventricular hypertrophy. Right Ventricle: The right ventricular size is normal. No increase in right ventricular wall thickness. Right ventricular systolic function is normal. Left Atrium: Left atrial size was normal in size. No left atrial/left atrial appendage thrombus was detected. Right Atrium: Right atrial size was normal in size. Pericardium: There is no evidence of pericardial effusion. Mitral Valve: The mitral valve is normal in structure. Trivial mitral valve regurgitation. No evidence of mitral valve stenosis. Tricuspid Valve: The tricuspid valve is normal in structure. Tricuspid valve regurgitation is not demonstrated. No evidence of tricuspid stenosis. Aortic Valve: The aortic valve is normal in structure. Aortic valve regurgitation is not visualized. No aortic stenosis is present. Pulmonic Valve: The pulmonic valve was normal in structure. Pulmonic valve regurgitation is trivial. No evidence of pulmonic stenosis. Aorta: The aortic root is normal in size and structure. There is minimal (Grade I) layered plaque. Venous: The inferior vena cava is normal in size with greater than 50% respiratory variability, suggesting right atrial pressure of 3 mmHg. IAS/Shunts: Possible small shunt. Agitated saline contrast was given intravenously to evaluate for  intracardiac shunting. Possible very small PFO noted with agitated saline contrast study with late bubbles after 7 cardiac cycles and Valsalva. [name] MD Electronically signed by [name] MD Signature Date/Time: 04/19/2020/12:47:22 PM    Final    ECHOCARDIOGRAM COMPLETE BUBBLE STUDY  Result Date: 04/16/2020    ECHOCARDIOGRAM REPORT   Patient Name:   Tony Schmitt Date of Exam: 04/16/2020 Medical Rec #:  [id number]     Height:       64.0 in Accession #:    [id number]    Weight:       156.0 lb Date of Birth:  May 19, 1971     BSA:          1.760 m Patient Age:    50 years      BP:           199/106 mmHg Patient Gender: M             HR:           99 bpm. Exam Location:  Inpatient Procedure: 2D Echo Indications:    434.91 stroke  History:        Patient has prior history of Echocardiogram examinations, most                 recent 04/18/2019. Risk Factors:Hypertension, Diabetes and Former                 Smoker.  Sonographer:    Jannett Celestine RDCS (AE) Referring Phys: [place]  1. Left ventricular ejection fraction, by estimation, is 60 to 65%. The left ventricle has normal function. The left ventricle has no regional wall motion abnormalities. There is mild concentric left ventricular hypertrophy. Left ventricular diastolic function could not be evaluated.  2. Right ventricular systolic function is normal. The right ventricular size is normal. Tricuspid regurgitation signal is inadequate for assessing PA pressure.  3. The mitral valve is normal in structure. No evidence of mitral valve regurgitation. No evidence of mitral stenosis.  4. The aortic valve is normal in structure. Aortic valve regurgitation is  not visualized. No aortic stenosis is present.  5. The inferior vena cava is normal in size with greater than 50% respiratory variability, suggesting right atrial pressure of 3 mmHg. FINDINGS  Left Ventricle: Left ventricular ejection fraction, by estimation, is 60 to 65%. The left  ventricle has normal function. The left ventricle has no regional wall motion abnormalities. The left ventricular internal cavity size was normal in size. There is  mild concentric left ventricular hypertrophy. Left ventricular diastolic function could not be evaluated. Right Ventricle: The right ventricular size is normal. No increase in right ventricular wall thickness. Right ventricular systolic function is normal. Tricuspid regurgitation signal is inadequate for assessing PA pressure. Left Atrium: Left atrial size was normal in size. Right Atrium: Right atrial size was normal in size. Pericardium: There is no evidence of pericardial effusion. Mitral Valve: The mitral valve is normal in structure. No evidence of mitral valve regurgitation. No evidence of mitral valve stenosis. Tricuspid Valve: The tricuspid valve is normal in structure. Tricuspid valve regurgitation is not demonstrated. No evidence of tricuspid stenosis. Aortic Valve: The aortic valve is normal in structure. Aortic valve regurgitation is not visualized. No aortic stenosis is present. Pulmonic Valve: The pulmonic valve was normal in structure. Pulmonic valve regurgitation is not visualized. No evidence of pulmonic stenosis. Aorta: The aortic root is normal in size and structure. Venous: The inferior vena cava is normal in size with greater than 50% respiratory variability, suggesting right atrial pressure of 3 mmHg. IAS/Shunts: No atrial level shunt detected by color flow Doppler.  LEFT VENTRICLE PLAX 2D LVIDd:         4.30 cm LVIDs:         2.40 cm LV PW:         1.20 cm LV IVS:        1.20 cm LVOT diam:     2.00 cm LV SV:         49 LV SV Index:   28 LVOT Area:     3.14 cm  RIGHT VENTRICLE RV S prime:     13.90 cm/s TAPSE (M-mode): 2.1 cm LEFT ATRIUM             Index LA diam:        2.90 cm 1.65 cm/m LA Vol (A2C):   26.8 ml 15.22 ml/m LA Vol (A4C):   49.3 ml 28.01 ml/m LA Biplane Vol: 36.5 ml 20.73 ml/m  AORTIC VALVE LVOT Vmax:   89.50  cm/s LVOT Vmean:  65.200 cm/s LVOT VTI:    0.157 m  AORTA Ao Root diam: 3.20 cm  SHUNTS Systemic VTI:  0.16 m Systemic Diam: 2.00 cm [name] MD Electronically signed by [name] MD Signature Date/Time: 04/16/2020/9:31:26 AM    Final    VAS US CAROTID (at [facility] and [facility] only)  Result Date: 04/19/2020 Carotid Arterial Duplex Study Indications:       CVA. Risk Factors:      Hypertension, Diabetes. Comparison Study:  no prior Performing Technologist: Abram Sander RVS  Examination Guidelines: A complete evaluation includes B-mode imaging, spectral Doppler, color Doppler, and power Doppler as needed of all accessible portions of each vessel. Bilateral testing is considered an integral part of a complete examination. Limited examinations for reoccurring indications may be performed as noted.  Right Carotid Findings: +----------+--------+--------+--------+------------------+--------+           PSV cm/sEDV cm/sStenosisPlaque DescriptionComments +----------+--------+--------+--------+------------------+--------+ CCA Prox  100     19  heterogenous               +----------+--------+--------+--------+------------------+--------+ CCA Distal76      14              heterogenous               +----------+--------+--------+--------+------------------+--------+ ICA Prox  66      28      1-39%   heterogenous      tortuous +----------+--------+--------+--------+------------------+--------+ ICA Distal59      23                                         +----------+--------+--------+--------+------------------+--------+ ECA       104     11                                         +----------+--------+--------+--------+------------------+--------+ +----------+--------+-------+--------+-------------------+           PSV cm/sEDV cmsDescribeArm Pressure (mmHG) +----------+--------+-------+--------+-------------------+ Subclavian102                                         +----------+--------+-------+--------+-------------------+ +---------+--------+--+--------+--+---------+ VertebralPSV cm/s66EDV cm/s21Antegrade +---------+--------+--+--------+--+---------+  Left Carotid Findings: +----------+--------+--------+--------+------------------+--------+           PSV cm/sEDV cm/sStenosisPlaque DescriptionComments +----------+--------+--------+--------+------------------+--------+ CCA Prox  106     22              heterogenous               +----------+--------+--------+--------+------------------+--------+ CCA Distal96      22              heterogenous               +----------+--------+--------+--------+------------------+--------+ ICA Prox  75      25      1-39%   heterogenous      tortuous +----------+--------+--------+--------+------------------+--------+ ICA Distal97      37                                         +----------+--------+--------+--------+------------------+--------+ ECA       86      11                                         +----------+--------+--------+--------+------------------+--------+ +----------+--------+--------+--------+-------------------+           PSV cm/sEDV cm/sDescribeArm Pressure (mmHG) +----------+--------+--------+--------+-------------------+ Subclavian206                                         +----------+--------+--------+--------+-------------------+ +---------+--------+--+--------+--+---------+ VertebralPSV cm/s44EDV cm/s18Antegrade +---------+--------+--+--------+--+---------+   Summary: Right Carotid: Velocities in the right ICA are consistent with a 1-39% stenosis. Left Carotid: Velocities in the left ICA are consistent with a 1-39% stenosis. Vertebrals: Bilateral vertebral arteries demonstrate antegrade flow. *See table(s) above for measurements and observations.  Electronically signed by Antony Contras MD on 04/19/2020 at 12:33:14 PM.    Final    VAS [place] LOWER  EXTREMITY  VENOUS (DVT)  Result Date: 04/19/2020  Lower Venous DVTStudy Indications: Stroke.  Comparison Study: no prior Performing Technologist: Abram Sander RVS  Examination Guidelines: A complete evaluation includes B-mode imaging, spectral Doppler, color Doppler, and power Doppler as needed of all accessible portions of each vessel. Bilateral testing is considered an integral part of a complete examination. Limited examinations for reoccurring indications may be performed as noted. The reflux portion of the exam is performed with the patient in reverse Trendelenburg.  +---------+---------------+---------+-----------+----------+--------------+ RIGHT    CompressibilityPhasicitySpontaneityPropertiesThrombus Aging +---------+---------------+---------+-----------+----------+--------------+ CFV      Full           Yes      Yes                                 +---------+---------------+---------+-----------+----------+--------------+ SFJ      Full                                                        +---------+---------------+---------+-----------+----------+--------------+ FV Prox  Full                                                        +---------+---------------+---------+-----------+----------+--------------+ FV Mid   Full                                                        +---------+---------------+---------+-----------+----------+--------------+ FV DistalFull                                                        +---------+---------------+---------+-----------+----------+--------------+ PFV      Full                                                        +---------+---------------+---------+-----------+----------+--------------+ POP      Full           Yes      Yes                                 +---------+---------------+---------+-----------+----------+--------------+ PTV      Full                                                         +---------+---------------+---------+-----------+----------+--------------+ PERO     Full                                                        +---------+---------------+---------+-----------+----------+--------------+   +---------+---------------+---------+-----------+----------+--------------+  LEFT     CompressibilityPhasicitySpontaneityPropertiesThrombus Aging +---------+---------------+---------+-----------+----------+--------------+ CFV      Full           Yes      Yes                                 +---------+---------------+---------+-----------+----------+--------------+ SFJ      Full                                                        +---------+---------------+---------+-----------+----------+--------------+ FV Prox  Full                                                        +---------+---------------+---------+-----------+----------+--------------+ FV Mid   Full                                                        +---------+---------------+---------+-----------+----------+--------------+ FV DistalFull                                                        +---------+---------------+---------+-----------+----------+--------------+ PFV      Full                                                        +---------+---------------+---------+-----------+----------+--------------+ POP      Full           Yes      Yes                                 +---------+---------------+---------+-----------+----------+--------------+ PTV      Full                                                        +---------+---------------+---------+-----------+----------+--------------+ PERO     Full                                                        +---------+---------------+---------+-----------+----------+--------------+     Summary: BILATERAL: - No evidence of deep vein thrombosis seen in the lower extremities, bilaterally. - No evidence of  superficial venous thrombosis in the lower extremities, bilaterally. -   *See table(s) above for measurements and observations. Electronically signed by Curt Jews MD  on 04/19/2020 at 5:06:45 PM.    Final    VAS [place] UPPER EXTREMITY VENOUS DUPLEX  Result Date: 04/19/2020 UPPER VENOUS STUDY  Indications: Swelling Comparison Study: no prior Performing Technologist: Abram Sander RVS  Examination Guidelines: A complete evaluation includes B-mode imaging, spectral Doppler, color Doppler, and power Doppler as needed of all accessible portions of each vessel. Bilateral testing is considered an integral part of a complete examination. Limited examinations for reoccurring indications may be performed as noted.  Left Findings: +----------+------------+---------+-----------+----------+--------------+ LEFT      CompressiblePhasicitySpontaneousProperties   Summary     +----------+------------+---------+-----------+----------+--------------+ IJV           Full       Yes       Yes                             +----------+------------+---------+-----------+----------+--------------+ Subclavian    Full       Yes       Yes                             +----------+------------+---------+-----------+----------+--------------+ Axillary      Full       Yes       Yes                             +----------+------------+---------+-----------+----------+--------------+ Brachial      Full       Yes       Yes                             +----------+------------+---------+-----------+----------+--------------+ Radial        Full                                                 +----------+------------+---------+-----------+----------+--------------+ Ulnar         Full                                                 +----------+------------+---------+-----------+----------+--------------+ Cephalic                                            Not visualized  +----------+------------+---------+-----------+----------+--------------+ Basilic                                             Not visualized +----------+------------+---------+-----------+----------+--------------+  Summary:  Left: No evidence of deep vein thrombosis in the upper extremity.  *See table(s) above for measurements and observations.  Diagnosing physician: Curt Jews MD Electronically signed by Curt Jews MD on 04/19/2020 at 5:06:34 PM.    Final     Labs:  Basic Metabolic Panel: Recent Labs  Lab 05/09/20 0628 05/11/20 0017 05/11/20 1040  NA 137 138 139  K 3.9 3.6 4.1  CL 103 105 105  CO2 28 23 25  GLUCOSE 145* 197* 169*  BUN 43* 43* 39*  CREATININE 2.99* 2.86* 2.84*  CALCIUM 9.0 9.0 9.1  PHOS  --  3.1  --     CBC: Recent Labs  Lab 05/09/20 0628 05/10/20 0549  WBC 13.4* 11.4*  NEUTROABS 8.7* 7.1  HGB 8.9* 9.1*  HCT 28.1* 28.8*  MCV 85.9 85.0  PLT 531* 556*    CBG: Recent Labs  Lab 05/10/20 2132 05/11/20 0626 05/11/20 1145 05/11/20 1640 05/11/20 2112  GLUCAP 204* 117* 151* 181* 190*   Family history.  Mother with diabetes father with diabetes.  Father with hypertension as well as CHF.  Denies any colon cancer esophageal cancer or rectal cancer  Brief HPI:   Tony Schmitt is a 49 y.o. right-handed male with history of diabetes mellitus tobacco abuse CKD stage III with creatinine baseline 2.42, hypertension as well as medical noncompliance.  Patient lives with his mother 1 level home 2 steps to entry.  Independent prior to admission.  Presented 04/15/2020 with left-sided weakness.  Blood pressure noted to be 176/109.  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  Punctate cortical infarct along the inferior left frontal operculum.  No flow signal in the distal left vertebral artery above the craniocervical junction.  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  Carotid Dopplers with 1 to 39% ICA stenosis.  Patient did not receive TPA.  Admission chemistries alcohol negative urine drug screen negative creatinine 3.35 glucose 227 BUN 31 hemoglobin A1c 9.6.  Echocardiogram with ejection fraction 60 to 65% no wall motion abnormalities.  Maintained on aspirin for CVA prophylaxis.  Subcutaneous Lovenox for DVT prophylaxis venous Doppler studies negative.  TEE showed ejection fraction 65% no evidence of thrombus possible very small PFO TCD study was completed negative for PFO no plans for loop recorder at this time.  Monitoring of renal function 3.10 maintained on IV fluids initially.  Tolerating a regular diet.  Therapy evaluations completed and patient was admitted for a comprehensive rehab program   Hospital Course: Tony Schmitt was admitted to rehab 04/22/2020 for inpatient therapies to consist of PT, ST and OT at least three hours five days a week. Past admission physiatrist, therapy team and rehab RN have worked together to provide customized collaborative inpatient rehab.  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  Patient would follow-up neurology services.  Lovenox for DVT prophylaxis later discontinued patient ambulating greater than 150 feet.  Blood pressure controlled on Norvasc as well as Lopressor.  Lisinopril is prior to admission held due to AKI.  Patient would need follow-up outpatient monitoring of blood pressure.  Blood sugars controlled monitored hemoglobin A1c 9.6 patient had been on low-dose Lantus insulin while in the hospital on oral agent Glucotrol prior to admission.  Patient was noted to be medically noncompliant in regards to his diabetes mellitus and did not want a use insulin  at home at this time requesting to resume Glucotrol which was started back at 5 mg twice daily at discharge.  His Glucophage remained on hold due to AKI.  Patient would need follow-up with PCP.Marland Kitchen  AKI on CKD creatinine baseline 2.42 as of 08/02/2018 renal ultrasound 04/17/2019 showed no hydronephrosis.  Creatinine stabilizing  2.86 he was recently on IV fluid x3 nights completed and would follow-up outpatient nephrology.  Patient did have a history of tobacco abuse receiving counsel regarding cessation of nicotine products..   Blood pressures were monitored on TID basis and controlled  Diabetes has been monitored with ac/hs CBG checks and SSI was use prn for tighter BS control.    Rehab course: During patient's stay in rehab weekly team conferences were held to monitor patient's progress, set goals and discuss barriers to discharge. At admission, patient required moderate assist supine to sit moderate assist sit to stand.  Ambulating 15 feet moderate assist rolling walker.  Minimal assist for grooming  Physical exam.  Blood pressure 158/83 pulse 86 temperature 97 respirations 18 oxygen saturations 98% room air Constitutional.  No acute distress HEENT Head.  Normocephalic and atraumatic Neck.  Supple nontender no JVD without thyromegaly Cardiac regular rate rhythm without any extra sounds or murmur heard Abdomen.  Soft nontender positive bowel sounds without rebound Respiratory effort normal no respiratory distress without wheeze Skin.  Warm and dry Neurologic.  Alert oriented makes eye contact with examiner follows commands Motor.  Right upper extremity right lower extremity 5/5 proximal distal Left upper extremity 0 out of 5 proximal distal Left lower extremity hip flexion knee extension 1/5, ankle dorsiflexion 0/5 sensation intact light touch  He/  has had improvement in activity tolerance, balance, postural control as well as ability to compensate for deficits. He/ has had improvement in  functional use RUE/LUE  and RLE/LLE as well as improvement in awareness.  Self propels wheelchair modified independent.  Ambulates 150 feet min assist rolling walker.  Required some min assist for controlled lowering to the sofa.  Educated patient's mother regarding functional transfer status and assistance technique for ADLs.  Patient completed all functional transfers contact-guard assist from mother using good carryover of body techniques.  Full family teaching completed plan discharge to home       Disposition: Discharged to home    Diet: Diabetic diet  Special Instructions: No driving smoking or alcohol  Follow-up with primary care physician in regards to monitoring of blood pressure and diabetes  Patient should have follow-up chemistries within 1 week to monitor renal function.  Medications at discharge 1.  Tylenol as needed 2.  Norvasc 10 mg p.o. daily 3.  Aspirin 81 mg p.o. daily 4.  Lipitor 80 mg p.o. daily 5.  Glucotrol 5 mg twice daily 6.  Lopressor 100 mg p.o. twice daily 7.  Protonix 40 mg p.o. daily 8.  MiraLAX twice daily hold for loose stools 9.  Sodium bicarbonate 1300 mg p.o. twice daily  30-35 minutes were spent completing discharge summary and discharge planning  Discharge Instructions     Ambulatory referral to Nephrology   Complete by: As directed    Schedule appointment evaluation for CKD stage III   Ambulatory referral to Neurology   Complete by: As directed    An appointment is requested in approximately 4 weeks acute subacute posterior right corona radiata and anterior aspect left external capsule infarction   Ambulatory referral to Physical Medicine Rehab   Complete by: As directed    Moderate complexity follow-up 1 to 2 weeks acute/subacute posterior right corona radiata and left external capsule infarction        Follow-up Information     Jamse Arn, MD Follow up.   Specialty: Physical Medicine and Rehabilitation Why: Office to  call for appointment Contact information: [address] [place] [address] [phone]         Constance Haw, MD Follow up.   Specialty: Cardiology Why: Call for appointment to discuss loop recorder placement Contact information: [address] [place] [address] [phone]         Bennie Pierini, MD Follow up.   Specialty: Internal Medicine Why: Call for appointment to schedule evaluation for chronic kidney disease stage III Contact information: [place] [address] [phone]         Charlott Rakes, MD Follow up on 05/26/2020.   Specialty: Family Medicine Why: Appointment @ 9:30 am Contact information: [place] [address] [phone]                 Signed: Cathlyn Parsons 05/12/2020, 5:14 AM Patient was seen, face-face, and physical exam performed by me on day of discharge, greater than 30 minutes of total time spent.. Please see progress note from day of discharge as well.  Delice Lesch, MD, ABPMR

## 2020-05-10 NOTE — Progress Notes (Signed)
[place] PHYSICAL MEDICINE & REHABILITATION PROGRESS NOTE   Subjective/Complaints: Patient seen laying in bed this morning.  He states he slept well overnight.  He denies complaints.  ROS: Denies CP, SOB, N/V/D  Objective:   No results found. Recent Labs    05/09/20 0628 05/10/20 0549  WBC 13.4* 11.4*  HGB 8.9* 9.1*  HCT 28.1* 28.8*  PLT 531* 556*   Recent Labs    05/09/20 0628  NA 137  K 3.9  CL 103  CO2 28  GLUCOSE 145*  BUN 43*  CREATININE 2.99*  CALCIUM 9.0    Intake/Output Summary (Last 24 hours) at 05/10/2020 1222 Last data filed at 05/10/2020 0724 Gross per 24 hour  Intake 840 ml  Output 1200 ml  Net -360 ml        Physical Exam: Vital Signs Blood pressure (!) 154/90, pulse 73, temperature 98.5 F (36.9 C), temperature source Oral, resp. rate 18, height 5\' 4"  (1.626 m), weight 72.9 kg, SpO2 99 %.  Constitutional: No distress . Vital signs reviewed. HENT: Normocephalic.  Atraumatic. Eyes: EOMI. No discharge. Cardiovascular: No JVD.  RRR. Respiratory: Normal effort.  No stridor.  Bilateral clear to auscultation. GI: Non-distended.  BS +. Skin: Warm and dry.  Intact. Psych: Normal mood.  Normal behavior. Musc: No edema in extremities.  No tenderness in extremities. Neuro: Alert Motor: RUE/RLE: 4-4+/5 proximal distal LUE: Shoulder abduction 2/5, distally 0/5, unchanged Left lower extremity: Hip flexion, knee extension 3/5, ankle dorsiflexion 0/5, unchanged  Assessment/Plan: 1. Functional deficits secondary to RIght corona radiata infarct which require 3+ hours per day of interdisciplinary therapy in a comprehensive inpatient rehab setting.  Physiatrist is providing close team supervision and 24 hour management of active medical problems listed below.  Physiatrist and rehab team continue to assess barriers to discharge/monitor patient progress toward functional and medical goals  Care Tool:  Bathing  Bathing activity did not occur:   (N/A) Body parts bathed by patient: Right arm, Left arm, Chest, Abdomen   Body parts bathed by helper: Right arm     Bathing assist Assist Level: Set up assist     Upper Body Dressing/Undressing Upper body dressing   What is the patient wearing?: Pull over shirt    Upper body assist Assist Level: Set up assist    Lower Body Dressing/Undressing Lower body dressing      What is the patient wearing?: Pants, Underwear/pull up     Lower body assist Assist for lower body dressing: Minimal Assistance - Patient > 75%     Toileting Toileting    Toileting assist Assist for toileting: Supervision/Verbal cueing Assistive Device Comment:  (Urinal)   Transfers Chair/bed transfer  Transfers assist     Chair/bed transfer assist level: Minimal Assistance - Patient > 75%     Locomotion Ambulation   Ambulation assist      Assist level: Minimal Assistance - Patient > 75% Assistive device: Walker-rolling Max distance: 42ft   Walk 10 feet activity   Assist     Assist level: Minimal Assistance - Patient > 75% Assistive device: Walker-rolling   Walk 50 feet activity   Assist Walk 50 feet with 2 turns activity did not occur: Safety/medical concerns  Assist level: Minimal Assistance - Patient > 75% Assistive device: Walker-rolling    Walk 150 feet activity   Assist Walk 150 feet activity did not occur: Safety/medical concerns         Walk 10 feet on uneven surface  activity  Assist Walk 10 feet on uneven surfaces activity did not occur: Safety/medical concerns         Wheelchair     Assist Will patient use wheelchair at discharge?: No Type of Wheelchair: Manual    Wheelchair assist level: Supervision/Verbal cueing Max wheelchair distance: 157ft    Wheelchair 50 feet with 2 turns activity    Assist        Assist Level: Supervision/Verbal cueing   Wheelchair 150 feet activity     Assist      Assist Level:  Supervision/Verbal cueing   Blood pressure (!) 154/90, pulse 73, temperature 98.5 F (36.9 C), temperature source Oral, resp. rate 18, height 5\' 4"  (1.626 m), weight 72.9 kg, SpO2 99 %.    Medical Problem List and Plan: 1.  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   Continue CIR  WHO/PRAFO nightly 2.  Antithrombotics: -DVT/anticoagulation: Lovenox- discontinued sine ambulating >150 feet.             -antiplatelet therapy: Aspirin 81 mg daily 3. Pain Management: Tylenol as needed.  4. Mood: Provide emotional support             -antipsychotic agents: N/A 5. Neuropsych: This patient is capable of making decisions on his own behalf. 6. Skin/Wound Care: Routine skin checks 7. Fluids/Electrolytes/Nutrition: Routine in and outs.   8.  Hypertension.    Lisinopril PTA, on hold due to AKI  Norvasc 10 mg daily  Lopressor 12.5 mg twice daily started on 9/21, increased to 50 twice daily on 9/24, increase to 75 on 9/29, increased to 100 BID on 10/2  Hydralazine 10 3 times daily started on 10/5 Vitals:   05/09/20 2022 05/10/20 0342  BP: (!) 162/91 (!) 154/90  Pulse: 76 73  Resp:  18  Temp: 98.3 F (36.8 C) 98.5 F (36.9 C)  SpO2: 100% 99%   9.  Diabetes mellitus with hyperglycemia.  Hemoglobin A1c 9.6.  Check blood sugars before meals and at bedtime.  Diabetic teaching  Lantus increased to 10 units on 10/6 CBG (last 3)  Recent Labs    05/09/20 2145 05/10/20 0604 05/10/20 1142  GLUCAP 146* 147* 145*   Lantus 8  Labile on 10/4, monitor for trending 10.  AKI on CKD stage III.  Creatinine baseline 2.42 08/02/2018.  Renal ultrasound 04/17/2019 showed no hydronephrosis or shadowing stone.  Started on sodium bicarbonate 1300 mg twice daily 04/17/2020.    Creatinine 2.99 on 10/4, labs ordered for tomorrow  EF reviewed-60-65%  IVF nightly x3 nights ordered on 9/22  (complete)  -appreciate nephrology follow up 11.  Tobacco abuse.  Counsel 12.  Hyperlipidemia: Lipitor 13.  Slow transit constipation  Improved 14. Leukocytosis:   WBCs 11.4 on 10/5  Afebrile  Continue to monitor 15.  Acute blood loss anemia?  On chronic anemia  Hemoglobin 9.1 on 10/5 16.  Severe hypoalbuminemia  Supplement initiated on 9/21 17.  Thrombocytosis  Platelets 556 on 10/5  Continue to monitor  LOS: 18 days A FACE TO FACE EVALUATION WAS PERFORMED  Tony Schmitt [name] 05/10/2020, 12:22 PM

## 2020-05-10 NOTE — Progress Notes (Signed)
Occupational Therapy Session Note  Patient Details  Name: Tony Schmitt MRN: [id number] Date of Birth: [birth year]  Today's Date: 05/10/2020 OT Individual Time: 6840-3353 OT Individual Time Calculation (min): 63 min    Short Term Goals: Week 3:  OT Short Term Goal 1 (Week 3): STGs=LTGs due to ELOS  Skilled Therapeutic Interventions/Progress Updates:    Pt recevied supine with no c/o pain. Agreeable to shower. Bed mobility completed at (S) level. Pt completed squat pivot transfer toward the R with CGA, min cueing required for LLE placement. Pt completed stand pivot transfer into the walk in shower with CGA, using grab bars onto TTB. Pt required min cueing for use of UB/LB hemi dressing and bathing techniques. Pt able to complete all bathing at CGA level, using LH sponge to wash RUE and feet. Pt finished shower and dryed off, asking OT "how will I do this at home with no help". Reinforced need for at least supervision and second person to hand towels to pt at the least, pt stated this may be possible. Pt completed LB dressing with min A to thread over LLE. Shirt donned with min A, difficulty sequencing hemi technique and requiring mod cueing as pt attempted to thread just hand and then bring over head. Pt donned pants with min A. Socks donned mod A with cueing/demo provided for one handed technique. Care coordinated with primary OT re d/c planning. Pt requested to return to bed and was left supine with all needs met, bed alarm set.   Therapy Documentation Precautions:  Precautions Precautions: Fall, Other (comment) Precaution Comments: left hemiparesis Restrictions Weight Bearing Restrictions: No   Therapy/Group: Individual Therapy  Curtis Sites 05/10/2020, 9:13 AM

## 2020-05-11 ENCOUNTER — Inpatient Hospital Stay (HOSPITAL_COMMUNITY): Payer: Self-pay | Admitting: Occupational Therapy

## 2020-05-11 ENCOUNTER — Inpatient Hospital Stay (HOSPITAL_COMMUNITY): Payer: Self-pay

## 2020-05-11 ENCOUNTER — Other Ambulatory Visit (HOSPITAL_COMMUNITY): Payer: Self-pay | Admitting: Physician Assistant

## 2020-05-11 LAB — RENAL FUNCTION PANEL
Albumin: 1.3 g/dL — ABNORMAL LOW (ref 3.5–5.0)
Anion gap: 10 (ref 5–15)
BUN: 43 mg/dL — ABNORMAL HIGH (ref 6–20)
CO2: 23 mmol/L (ref 22–32)
Calcium: 9 mg/dL (ref 8.9–10.3)
Chloride: 105 mmol/L (ref 98–111)
Creatinine, Ser: 2.86 mg/dL — ABNORMAL HIGH (ref 0.61–1.24)
GFR calc non Af Amer: 25 mL/min — ABNORMAL LOW (ref >60)
Glucose, Bld: 197 mg/dL — ABNORMAL HIGH (ref 70–99)
Phosphorus: 3.1 mg/dL (ref 2.5–4.6)
Potassium: 3.6 mmol/L (ref 3.5–5.1)
Sodium: 138 mmol/L (ref 135–145)

## 2020-05-11 LAB — BASIC METABOLIC PANEL
Anion gap: 9 (ref 5–15)
BUN: 39 mg/dL — ABNORMAL HIGH (ref 6–20)
CO2: 25 mmol/L (ref 22–32)
Calcium: 9.1 mg/dL (ref 8.9–10.3)
Chloride: 105 mmol/L (ref 98–111)
Creatinine, Ser: 2.84 mg/dL — ABNORMAL HIGH (ref 0.61–1.24)
GFR calc non Af Amer: 25 mL/min — ABNORMAL LOW (ref >60)
Glucose, Bld: 169 mg/dL — ABNORMAL HIGH (ref 70–99)
Potassium: 4.1 mmol/L (ref 3.5–5.1)
Sodium: 139 mmol/L (ref 135–145)

## 2020-05-11 LAB — GLUCOSE, CAPILLARY
Glucose-Capillary: 117 mg/dL — ABNORMAL HIGH (ref 70–99)
Glucose-Capillary: 151 mg/dL — ABNORMAL HIGH (ref 70–99)
Glucose-Capillary: 181 mg/dL — ABNORMAL HIGH (ref 70–99)
Glucose-Capillary: 190 mg/dL — ABNORMAL HIGH (ref 70–99)

## 2020-05-11 MED ORDER — POLYETHYLENE GLYCOL 3350 17 G PO PACK: 17.0000 g | PACK | Freq: Two times a day (BID) | ORAL | 0 refills | Status: DC

## 2020-05-11 MED ORDER — HYDRALAZINE HCL 10 MG PO TABS: 10.0000 mg | ORAL_TABLET | Freq: Three times a day (TID) | ORAL | 0 refills | Status: DC

## 2020-05-11 MED ORDER — AMLODIPINE BESYLATE 10 MG PO TABS: 10.0000 mg | ORAL_TABLET | Freq: Every day | ORAL | 0 refills | Status: DC

## 2020-05-11 MED ORDER — OMEPRAZOLE 40 MG PO CPDR: 40.0000 mg | DELAYED_RELEASE_CAPSULE | Freq: Every day | ORAL | 3 refills | Status: DC

## 2020-05-11 MED ORDER — GLIPIZIDE 5 MG PO TABS: ORAL_TABLET | ORAL | 0 refills | Status: DC

## 2020-05-11 MED ORDER — SODIUM BICARBONATE 650 MG PO TABS: 1300.0000 mg | ORAL_TABLET | Freq: Two times a day (BID) | ORAL | 0 refills | Status: DC

## 2020-05-11 MED ORDER — ATORVASTATIN CALCIUM 80 MG PO TABS: 80.0000 mg | ORAL_TABLET | Freq: Every day | ORAL | 0 refills | Status: DC

## 2020-05-11 MED ORDER — METOPROLOL TARTRATE 100 MG PO TABS: 100.0000 mg | ORAL_TABLET | Freq: Two times a day (BID) | ORAL | 0 refills | Status: DC

## 2020-05-11 MED ORDER — ACETAMINOPHEN 325 MG PO TABS: 650.0000 mg | ORAL_TABLET | ORAL | Status: DC | PRN

## 2020-05-11 MED ORDER — SENNOSIDES-DOCUSATE SODIUM 8.6-50 MG PO TABS: 2.0000 | ORAL_TABLET | Freq: Every day | ORAL | Status: DC

## 2020-05-11 MED FILL — glipiZIDE 5 MG TABS: 5 | 30 days supply | Qty: 60 | Fill #0

## 2020-05-11 MED FILL — OMEPRAZOLE 40 MG CPDR: 40 | 30 days supply | Qty: 30 | Fill #0

## 2020-05-11 MED FILL — SODIUM BICARBONATE 650 MG T: 650 | 30 days supply | Qty: 120 | Fill #0

## 2020-05-11 MED FILL — METOPROLOL TARTRATE 100 MG: 100 | 30 days supply | Qty: 60 | Fill #0

## 2020-05-11 MED FILL — AMLODIPINE BESYLATE 10 MG T: 10 | 30 days supply | Qty: 30 | Fill #0

## 2020-05-11 MED FILL — ATORVASTATIN CALCIUM 80 MG: 80 | 30 days supply | Qty: 30 | Fill #0

## 2020-05-11 MED FILL — hydrALAZINE HCL 10 MG TABS: 10 | 30 days supply | Qty: 90 | Fill #0

## 2020-05-11 NOTE — Progress Notes (Signed)
Occupational Therapy Discharge Summary  Patient Details  Name: Tony Schmitt MRN: [id number] Date of Birth: November 11, 1970  Today's Date: 05/11/2020 OT Individual Time: 5176-1607; and 1500-1530 OT Individual Time Calculation (min): 30 min ; and 30 min   Patient has met 8 of 10 long term goals due to improved activity tolerance, improved balance, postural control, ability to compensate for deficits, improved attention and improved coordination.  Patient to discharge at overall Supervision level.  Patient's care partner is independent to provide the necessary cognitive assistance at discharge.    Reasons goals not met: Slow motor return of LUE despite therapeutic efforts including Neuro re-ed and strengthening.  Provided pt with HEP for LUE strength program.    Recommendation:  Patient will benefit from ongoing performance of HEP for LUE strengthening.  Due to pt without insurance, pt declining further OT at this time. Provided resource on [facility] Stroke Support Group.  Equipment: No equipment provided.  Pts mother able to purchase all necessary equipment.  Reasons for discharge: treatment goals met and discharge from hospital  Patient/family agrees with progress made and goals achieved: Yes   Skilled Intervention:   First session:  Pt supine in bed finishing up with nurse tech, no c/o pain, agreeable to OT session.  Educated pt regarding HEP for LUE strengthening and ROM.  Provided handout for improved carryover.  Pt demonstrated independence reading and comprehending HEP handout.  Pt completed supine to sit with supervision.  Pt completed left scapular elevation and retractions, AAROM shoulder FF using RUE to manually assist LUE.  Supine shoulder abduction AROM, supine elbow flex/ext using RUE to assist. Occasional VCs needed to improve body mechanics and for problem solving. 3 x 12 reps completed of each.  Call bell in reach, bed alarm on.  Second session: Pt with no c/o pain, mother  at bedside.  Reviewed dc recommendations including AE and DME needs and pts mother reports most items have been ordered and will arrive tomorrow.  Pt completed supine to sit with supervision, squat pivot transfer with CGA.  Pt transported to tub shower bathroom and reviewed w/c positioning and body mechanics/sequencing for TTB transfer.  Pt completed with CGA and intermittent VCs.  Pt transported to room and completed squat pivot to EOB with CGA and sit to supine with supervision.  Educated pt and mother regarding [facility] Stroke Support Group and provided resource.  Call bell in reach, bed alarm on.    OT Discharge Precautions/Restrictions  Precautions Precautions: Fall Precaution Comments: left hemiparesis Restrictions Weight Bearing Restrictions: No Pain Pain Assessment Pain Scale: 0-10 Pain Score: 0-No pain ADL ADL Eating: Independent Grooming: Setup Where Assessed-Grooming: Sitting at sink Upper Body Bathing: Setup Where Assessed-Upper Body Bathing: Shower Lower Body Bathing: Supervision/safety Where Assessed-Lower Body Bathing: Shower Upper Body Dressing: Setup Where Assessed-Upper Body Dressing: Wheelchair Lower Body Dressing: Supervision/safety Where Assessed-Lower Body Dressing: Wheelchair, Standing at sink Toileting: Supervision/safety Where Assessed-Toileting: Glass blower/designer: Close supervision Toilet Transfer Method: Arts development officer: Energy manager: Close supervison Clinical cytogeneticist Method: Engineer, production: Facilities manager: Curator Method: Radiographer, therapeutic: Grab bars, Gaffer Baseline Vision/History: Wears glasses Wears Glasses: Reading only Patient Visual Report: No change from baseline Vision Assessment?: No apparent visual deficits Perception  Perception: Impaired Inattention/Neglect: Other  (comment) Praxis Praxis: Impaired Praxis Impairment Details: Ideomotor;Motor planning;Initiation Cognition Overall Cognitive Status: Within Functional Limits for tasks assessed Arousal/Alertness: Awake/alert Orientation  Level: Oriented X4 Attention: Focused;Sustained Focused Attention: Appears intact Sustained Attention: Appears intact Memory: Impaired Memory Impairment: Decreased recall of new information;Decreased short term memory Decreased Short Term Memory: Functional basic Awareness: Impaired Awareness Impairment: Emergent impairment Problem Solving: Impaired Problem Solving Impairment: Functional basic Executive Function: Decision Making;Self Correcting Decision Making: Impaired Decision Making Impairment: Functional basic Self Correcting: Impaired Self Correcting Impairment: Functional basic Safety/Judgment: Appears intact Sensation Sensation Light Touch: Appears Intact Hot/Cold: Appears Intact Proprioception: Impaired Detail Proprioception Impaired Details: Impaired LLE;Impaired LUE Stereognosis: Not tested Coordination Gross Motor Movements are Fluid and Coordinated: No Fine Motor Movements are Fluid and Coordinated: No Coordination and Movement Description: impaired due to L hemiparesis and impaired balance with mild motor planning difficultie as well Finger Nose Finger Test: unable to perform LUE due to paresis Motor  Motor Motor: Hemiplegia;Abnormal postural alignment and control Motor - Skilled Clinical Observations: L hemiparesis (UE more impaired than LE) Mobility  Bed Mobility Bed Mobility: Sit to Supine;Supine to Sit Supine to Sit: Supervision/Verbal cueing Sit to Supine: Supervision/Verbal cueing Transfers Sit to Stand: Supervision/Verbal cueing Stand to Sit: Supervision/Verbal cueing  Trunk/Postural Assessment  Cervical Assessment Cervical Assessment: Within Functional Limits Thoracic Assessment Thoracic Assessment: Within Functional  Limits Lumbar Assessment Lumbar Assessment: Within Functional Limits Postural Control Postural Control: Deficits on evaluation Righting Reactions: Mild delay  Balance Balance Balance Assessed: Yes Static Sitting Balance Static Sitting - Balance Support: Feet supported Static Sitting - Level of Assistance: 5: Stand by assistance Dynamic Sitting Balance Dynamic Sitting - Balance Support: During functional activity;Feet supported Dynamic Sitting - Level of Assistance: 5: Stand by assistance Static Standing Balance Static Standing - Balance Support: During functional activity Static Standing - Level of Assistance: 5: Stand by assistance Dynamic Standing Balance Dynamic Standing - Balance Support: During functional activity Dynamic Standing - Level of Assistance: 4: Min assist Extremity/Trunk Assessment RUE Assessment RUE Assessment: Within Functional Limits LUE Assessment LUE Assessment: Exceptions to [facility] Passive Range of Motion (PROM) Comments: WNL Active Range of Motion (AROM) Comments: Shoulder scaption: 45 degrees against gravity General Strength Comments: Pt still with mild edema. Pt has developed voluntary scapular AROM and good glenohumeral rhythm partially.  Shoulder abduction: 2-/5; 1/5 biceps/triceps; 0/5 FA, Wrist, Hand   Francis Yardley L Kemuel Buchmann 05/11/2020, 12:46 PM

## 2020-05-11 NOTE — Progress Notes (Addendum)
Physical Therapy Discharge Summary  Patient Details  Name: Tony Schmitt MRN: [id number] Date of Birth: 02-Dec-1970  Today's Date: 05/11/2020 PT Individual Time: 1300-1415 PT Individual Time Calculation (min): 75 min    Patient has met 5 of 9 long term goals due to improved activity tolerance, improved balance, increased strength, ability to compensate for deficits, functional use of  left upper extremity and left lower extremity, improved awareness and improved coordination.  Patient to discharge at a wheelchair level supervision/ Min Assist. Patient's care partner is independent to provide the necessary physical assistance at discharge.  Reasons goals not met: Goals set for supervision for transfers however he required CGA due to fluctuating performances with squat<>pivot. He also had goals set for CGA with gait and stairs, however he required minA with gait for RW management and truncal stability and minA for stairs. Family education was held where his mother was updated on mobility and home safety training. He was able to ambulate 164f with minA and RW but will be using a WC for primary means of mobility at home due to decreased safety with gait.   Recommendation:  Patient will benefit from ongoing skilled PT services in outpatient setting to continue to advance safe functional mobility, address ongoing impairments in L HB weakness, balance deficits, gait impairments, and decreased activity tolerance in order to minimize fall risk.  Equipment: No equipment provided. Pt uninsured and paid OOP for all DME including w/c and RW.  Reasons for discharge: treatment goals met  Patient/family agrees with progress made and goals achieved: Yes  PT Discharge Precautions/Restrictions Precautions Precautions: Fall Precaution Comments: left hemiparesis Restrictions Weight Bearing Restrictions: No Pain Pain Assessment Pain Scale: 0-10 Pain Score: 0-No pain Vision/Perception   Perception Perception: Impaired Inattention/Neglect: Other (comment) Praxis Praxis: Impaired Praxis Impairment Details: Ideomotor;Motor planning;Initiation  Cognition Overall Cognitive Status: Within Functional Limits for tasks assessed Arousal/Alertness: Awake/alert Orientation Level: Oriented X4 Attention: Focused;Sustained Focused Attention: Appears intact Sustained Attention: Appears intact Memory: Impaired Memory Impairment: Decreased recall of new information;Decreased short term memory Decreased Short Term Memory: Functional basic Awareness: Impaired Awareness Impairment: Emergent impairment Problem Solving: Impaired Problem Solving Impairment: Functional basic Executive Function: Decision Making;Self Correcting Decision Making: Impaired Decision Making Impairment: Functional basic Self Correcting: Impaired Self Correcting Impairment: Functional basic Safety/Judgment: Appears intact Sensation Sensation Light Touch: Appears Intact Hot/Cold: Appears Intact Proprioception: Impaired Detail Proprioception Impaired Details: Impaired LLE;Impaired LUE Stereognosis: Not tested Coordination Gross Motor Movements are Fluid and Coordinated: No Fine Motor Movements are Fluid and Coordinated: No Coordination and Movement Description: impaired due to L hemiparesis and impaired balance with mild motor planning difficultie as well Finger Nose Finger Test: unable to perform LUE due to paresis Motor  Motor Motor: Hemiplegia;Abnormal postural alignment and control Motor - Skilled Clinical Observations: L hemiparesis (UE more impaired than LE)  Mobility Bed Mobility Bed Mobility: Rolling Right;Rolling Left;Supine to Sit;Sit to Supine Rolling Right: Supervision/verbal cueing Rolling Left: Supervision/Verbal cueing Supine to Sit: Supervision/Verbal cueing Sit to Supine: Supervision/Verbal cueing Transfers Transfers: Sit to Stand;Stand to Sit;Squat Pivot Transfers;Stand Pivot  Transfers Sit to Stand: Supervision/Verbal cueing Stand to Sit: Supervision/Verbal cueing Stand Pivot Transfers: Minimal Assistance - Patient > 75% Stand Pivot Transfer Details: Manual facilitation for placement;Verbal cues for technique;Verbal cues for safe use of DME/AE;Verbal cues for precautions/safety Squat Pivot Transfers: Contact Guard/Touching assist Transfer (Assistive device): None Locomotion  Gait Ambulation: Yes Gait Assistance: Minimal Assistance - Patient > 75% Gait Distance (Feet): 150 Feet Assistive device: Rolling walker (with RW gutter  splint for LUE) Gait Assistance Details: Verbal cues for sequencing;Verbal cues for technique;Verbal cues for gait pattern;Verbal cues for precautions/safety;Visual cues/gestures for sequencing;Tactile cues for sequencing;Tactile cues for placement;Tactile cues for weight shifting;Visual cues/gestures for precautions/safety;Verbal cues for safe use of DME/AE;Visual cues for safe use of DME/AE Gait Gait: Yes Gait Pattern: Impaired Gait Pattern: Decreased step length - right;Decreased step length - left;Decreased stance time - left;Decreased stride length;Decreased hip/knee flexion - left;Decreased dorsiflexion - left;Poor foot clearance - left;Lateral trunk lean to right;Decreased weight shift to left;Step-to pattern;Left circumduction;Left flexed knee in stance;Left genu recurvatum (Intermittent L knee hyperextension vs excessive L knee flexion (keeps L knee flexed to prevent knee hyperextending)) Gait velocity: decreased Stairs / Additional Locomotion Stairs: Yes Stairs Assistance: Minimal Assistance - Patient > 75% Stair Management Technique: One rail Right Number of Stairs: 12 Height of Stairs: 6 Ramp: Minimal Assistance - Patient >75% Wheelchair Mobility Wheelchair Mobility: Yes Wheelchair Assistance: Independent with assistive device Wheelchair Propulsion: Right upper extremity;Right lower extremity Wheelchair Parts Management:  Independent Distance: >168f Trunk/Postural Assessment  Cervical Assessment Cervical Assessment: Within Functional Limits Thoracic Assessment Thoracic Assessment: Within Functional Limits Lumbar Assessment Lumbar Assessment: Within Functional Limits Postural Control Postural Control: Deficits on evaluation Righting Reactions: Mild delay  Balance Balance Balance Assessed: Yes Static Sitting Balance Static Sitting - Balance Support: Feet supported Static Sitting - Level of Assistance: 5: Stand by assistance Dynamic Sitting Balance Dynamic Sitting - Balance Support: During functional activity;Feet supported Dynamic Sitting - Level of Assistance: 5: Stand by assistance Static Standing Balance Static Standing - Balance Support: During functional activity Static Standing - Level of Assistance: 5: Stand by assistance Dynamic Standing Balance Dynamic Standing - Balance Support: During functional activity Dynamic Standing - Level of Assistance: 4: Min assist Extremity Assessment  RUE Assessment RUE Assessment: Within Functional Limits LUE Assessment LUE Assessment: Exceptions to [facility] ([facility] Comments: WNL Active Range of Motion (AROM) Comments: Shoulder scaption: 45 degrees against gravity General Strength Comments: Pt still with mild edema. Pt has developed voluntary scapular AROM and good glenohumeral rhythm partially.  Shoulder abduction: 2-/5; 1/5 biceps/triceps; 0/5 FA, Wrist, Hand RLE Assessment RLE Assessment: Within Functional Limits General Strength Comments: Grossly 5/5 LLE Assessment LLE Assessment: Exceptions to [facility] Strength Comments: ankle DF/PF 0/5, knee flex 2-/5, knee ext 2+/5, hip flex 2/5 (strength testing assessed in sitting)   Skilled Intervention: Pt received supine in bed, agreeable to PT session, denies any pain. Rolling L<>R with supervision with HOB flat without bed features. Supine<>sit with supervision with HOB flat and  without use of bed rail. Able to maintain sitting EOB with supervision without BUE support. Squat<>pivot towards stronger R side with CGA from EOB to w/c. He self propelled himself in w/c via hemi technique mod I in w/c using RLE and RUE for propulsion. He then performed car transfer with CGA and RW, increased difficulty getting LLE into car and using his RUE to assist. He ambulated up/down 183framp with RW and minA, then performed stair training where he was able to go up/down x12 steps with minA and R HR support. After seated rest, he self propelled himself from main therapy gym to dayroom gym ([id number] I in w/c. Then performed gait training of [id number] minA and RW on level surfaces. Able to correct L knee hyperextension ~75% of the time with min cues. Pt reporting need for urgent BM, he was able to self propel himself mod I in w/c back to his room. Stand<>pivot  with minA from w/c to toilet where he was continent of BM and able to manage pericare with setupA. Stand<>pivot with minA back to his w/c, wheeled sinkside for hand hygiene. He remained seated in w/c with belt alarm on, needs in reach. HEP printout provided and reviewed with patient.   Gabbi Whetstone P Vernette Moise PT 05/11/2020, 1:59 PM

## 2020-05-11 NOTE — Progress Notes (Signed)
Inpatient Rehabilitation Care Coordinator  Discharge Note  The overall goal for the admission was met for:   Discharge location: Yes-HOME WITH MOM WHO CAN PROVIDE 24 HR CARE  Length of Stay: Yes-20 DAYS  Discharge activity level: Yes-SUPERVISION-MIN ASSIST LEVEL  Home/community participation: Yes  Services provided included: MD, RD, PT, OT, RN, CM, Pharmacy, Neuropsych and SW  Financial Services: Other: SELF PAY-MEDICAID PENDING  Follow-up services arranged: Home Health: [facility]-PT & OT and Patient/Family has no preference for HH/DME agencies  Comments (or additional information):MOM HAS GOTTEN EQUIPMENT FOR PT AND HAD SOME-RW AND BSC. MATCH GIVEN TO PT FOR MEDICATION ASSISTANCE. PCP [facility] 10/21 @ 9:30 AM. FAMILY EDUCATION COMPLETED AND MOM COMFORTABLE WITH HIS CARE AT DC. MEDICAID AND SSD APPLICATIONS PENDING AND MOM WILL FOLLOW UP WITH. MED-ASSIST-TIANA BESSANT [phone]  Patient/Family verbalized understanding of follow-up arrangements: Yes  Individual responsible for coordination of the follow-up plan: [id number] [phone]  Confirmed correct DME delivered: Elease Hashimoto 05/11/2020    Elease Hashimoto

## 2020-05-11 NOTE — Progress Notes (Signed)
Patient ID: Tony Schmitt, male   DOB: 1970-08-21, 49 y.o.   MRN: [id number]  Met with pt and spoke with Mom via telephone to discuss team conference and confirm discharge tomorrow. She will be here at 10:00 and will go over discharge-home health and prescription assistance. See in am. She has gotten equipment pt needed.

## 2020-05-11 NOTE — Discharge Instructions (Signed)
Inpatient Rehab Discharge Instructions  Imanuel Pruiett Schmitt Discharge date and time: No discharge date for patient encounter.   Activities/Precautions/ Functional Status: Activity: activity as tolerated Diet: diabetic diet Wound Care: Routine skin checks Functional status:  ___ No restrictions     ___ Walk up steps independently ___ 24/7 supervision/assistance   ___ Walk up steps with assistance ___ Intermittent supervision/assistance  ___ Bathe/dress independently ___ Walk with walker     _x__ Bathe/dress with assistance ___ Walk Independently    ___ Shower independently ___ Walk with assistance    ___ Shower with assistance ___ No alcohol     ___ Return to work/school ________  Special Instructions: No driving smoking or alcohol  Follow-up with primary care provider for management of blood pressure and diabetes.  Glucophage remains on hold due to acute kidney injury.    COMMUNITY REFERRALS UPON DISCHARGE:    Home Health:   PT & OT                Agency: [facility] Phone: [phone]  Outpatient: PT             Tony Schmitt             Appointment Date/Time:WILL CALL MOM REGARDING SETTING UP APPOINTMENT  Medical Equipment/Items Ordered:MOM TO GET EQUIPMENT ON OWN DUE TO PATIENT UNINSURED                                                   MEDICAID AND SSD-MED Tony Schmitt [phone]  STROKE/TIA DISCHARGE INSTRUCTIONS SMOKING Cigarette smoking nearly doubles your risk of having a stroke & is the single most alterable risk factor  If you smoke or have smoked in the last 12 months, you are advised to quit smoking for your health.  Most of the excess cardiovascular risk related to smoking disappears within a year of stopping.  Ask you doctor about anti-smoking medications  [place] Quit Line: 1-800-QUIT NOW  Free Smoking Cessation Classes [phone]  CHOLESTEROL Know your levels; limit fat & cholesterol in your diet   Lipid Panel     Component Value Date/Time   CHOL 699 (H) 04/16/2020 0558   CHOL 382 (H) 08/19/2019 1526   TRIG 298 (H) 04/16/2020 0558   HDL 40 (L) 04/16/2020 0558   HDL 116 08/19/2019 1526   CHOLHDL 17.5 04/16/2020 0558   VLDL 60 (H) 04/16/2020 0558   LDLCALC 599 (H) 04/16/2020 0558   LDLCALC 209 (H) 08/19/2019 1526      Many patients benefit from treatment even if their cholesterol is at goal.  Goal: Total Cholesterol (CHOL) less than 160  Goal:  Triglycerides (TRIG) less than 150  Goal:  HDL greater than 40  Goal:  LDL (LDLCALC) less than 100   BLOOD PRESSURE American Stroke Association blood pressure target is less that 120/80 mm/Hg  Your discharge blood pressure is:  BP: (!) 170/95  Monitor your blood pressure  Limit your salt and alcohol intake  Many individuals will require more than one medication for high blood pressure  DIABETES (A1c is a blood sugar average for last 3 months) Goal HGBA1c is under 7% (HBGA1c is blood sugar average for last 3 months)  Diabetes:     Lab Results  Component Value Date   HGBA1C 9.6 (H) 04/16/2020  Your HGBA1c can be lowered with medications, healthy diet, and exercise.  Check your blood sugar as directed by your physician  Call your physician if you experience unexplained or low blood sugars.  PHYSICAL ACTIVITY/REHABILITATION Goal is 30 minutes at least 4 days per week  Activity: Increase activity slowly, Therapies: Physical Therapy: Home Health Return to work:   Activity decreases your risk of heart attack and stroke and makes your heart stronger.  It helps control your weight and blood pressure; helps you relax and can improve your mood.  Participate in a regular exercise program.  Talk with your doctor about the best form of exercise for you (dancing, walking, swimming, cycling).  DIET/WEIGHT Goal is to maintain a healthy weight  Your discharge diet is:  Diet Order            Diet heart healthy/carb modified  Room service appropriate? Yes; Fluid consistency: Thin  Diet effective now                 liquids Your height is:  Height: 5\' 4"  (162.6 cm) Your current weight is: Weight: 77.5 kg Your Body Mass Index (BMI) is:  BMI (Calculated): 29.31  Following the type of diet specifically designed for you will help prevent another stroke.  Your goal weight range is:    Your goal Body Mass Index (BMI) is 19-24.  Healthy food habits can help reduce 3 risk factors for stroke:  High cholesterol, hypertension, and excess weight.  RESOURCES Stroke/Support Group:  Call [phone]   STROKE EDUCATION PROVIDED/REVIEWED AND GIVEN TO PATIENT Stroke warning signs and symptoms How to activate emergency medical system (call 911). Medications prescribed at discharge. Need for follow-up after discharge. Personal risk factors for stroke. Pneumonia vaccine given:  Flu vaccine given:  My questions have been answered, the writing is legible, and I understand these instructions.  I will adhere to these goals & educational materials that have been provided to me after my discharge from the hospital.      My questions have been answered and I understand these instructions. I will adhere to these goals and the provided educational materials after my discharge from the hospital.  Patient/Caregiver Signature _______________________________ Date __________  Clinician Signature _______________________________________ Date __________  Please bring this form and your medication list with you to all your follow-up doctor's appointments.

## 2020-05-11 NOTE — Progress Notes (Signed)
Occupational Therapy Session Note  Patient Details  Name: Tony Schmitt MRN: [id number] Date of Birth: Apr 01, 1971  Today's Date: 05/11/2020 OT Individual Time: 4163-8453 OT Individual Time Calculation (min): 70 min    Short Term Goals: Week 3:  OT Short Term Goal 1 (Week 3): STGs=LTGs due to ELOS  Skilled Therapeutic Interventions/Progress Updates:    Pt received supine with no c/o pain. Pt agreeable to OT session but declining participation in ADLs. Pt completed bed mobility with (S) using bed features. Pt completed squat pivot transfer to the w/c with (S). He sat at the sink and with set up assist completed face washing. Pt was taken via w/c to the therapy gym. He used RW to complete ambulatory transfer to the mat with min A, min cueing for use of orthosis and strap. Discussed healthy lifestyle and future CVA risk/signs and symptoms. Pt transitioned into supine on mat with CGA. Pt completed resistive scapular protraction and retraction with good activation, requiring min facilitation for elbow extension. Pt then completed catch and hold elbow flexion while in supine with mod facilitation. Pt then completed hip bridges with adduction activation with tactile cueing, completing 3x10 repetitions. Pt transitioned into sidelying and completed gravity eliminated scapular protraction/retraction with great activation, requiring min facilitation to maintain straight trajectory. He also completed guided shoulder flex/ext with ~20 degrees of activation and AAROM provided. Pt edu on self PROM to complete at home. Pt still demonstrating some poor awareness of deficits asking if he can use dumbbells with his LUE. Pt returned to sitting EOM with min A to bring trunk off mat. He returned to his room and was left supine with all needs met, bed alarm set.   Therapy Documentation Precautions:  Precautions Precautions: Fall, Other (comment) Precaution Comments: left hemiparesis Restrictions Weight Bearing  Restrictions: No   Therapy/Group: Individual Therapy  Curtis Sites 05/11/2020, 7:49 AM

## 2020-05-11 NOTE — Patient Care Conference (Signed)
Inpatient RehabilitationTeam Conference and Plan of Care Update Date: 05/11/2020   Time: 10:59 AM    Patient Name: Tony Schmitt      Medical Record Number: [id number]  Date of Birth: Jun 10, 1971 Sex: Male         Room/Bed: 4W26C/4W26C-01 Payor Info: Payor: /    Admit Date/Time:  04/22/2020  3:08 PM  Primary Diagnosis:  CVA (cerebral vascular accident) [facility])  Hospital Problems: Principal Problem:   CVA (cerebral vascular accident) ([place]) Active Problems:   Hypoalbuminemia due to protein-calorie malnutrition ([place])   Acute blood loss anemia   Leukocytosis   Slow transit constipation   AKI (acute kidney injury) ([place])   Diabetes mellitus type 2 with complications, uncontrolled ([place])   Benign essential HTN   Thrombocytosis    Expected Discharge Date: Expected Discharge Date: 05/12/20  Team Members Present: Physician leading conference: Dr. Leeroy Cha Care Coodinator Present: Dorien Chihuahua, RN, BSN, CRRN;Other (comment) Jacqlyn Larsen [place], SW) Nurse Present: Other (comment) Shawn Route L,RN) PT Present: Other (comment) (Christain [place], PT) OT Present: Leretha Pol, OT PPS Coordinator present : Gunnar Fusi, SLP     Current Status/Progress Goal Weekly Team Focus  Bowel/Bladder   pt cont of bowel and bladder lbm- 05/09/20  remain cont.  Assess q shift and orn   Swallow/Nutrition/ Hydration             ADL's   setup UB ADLs, supervision LB ADLs; supervision to CGA for functional transfers  supervision  functional transfers, self care training, AE training, LUE NMR, caregiver edu, dc planning   Mobility   CGA bed mobility, CGA transfers, supervision w/c mobility, gait 188ft minA and RW  supervision/CGA  functional transfers, DC planning, gait training   Communication             Safety/Cognition/ Behavioral Observations            Pain   pt hasno current complaint of pain  remain free of pain  assess q shift and prn   Skin   no current skin breakdown  remain free form  skin breakdown  Assess q shift and prn     Discharge Planning:  Mom was in for family education, awar eof pt's care needs. Mom trying to get wheelchair for him and will assist with medications for discharge. Connected with PCP   Team Discussion: MD monitoring BP recordings and adjusting insulin dosages. Continent of B+B and requires reinforcement for medication compliance and dietary modifications as recommended.  Patient on target to meet rehab goals: yes, currently CGA for transfers ambulating 150' min assist with a RW  *See Care Plan and progress notes for long and short-term goals.   Revisions to Treatment Plan:   Teaching Needs: Compensatory techniques and strategies  Current Barriers to Discharge: Decreased caregiver support and Insurance for SNF coverage, Behavior  Possible Resolutions to Barriers: HH follow up arranged Family education    Medical Summary Current Status: BP elevated, CBGs elevated, continent of bowel and baldder, creatinine improving  Barriers to Discharge: Medical stability;Medication compliance;Decreased family/caregiver support  Barriers to Discharge Comments: BP elevated, CBGs elevated, only family support is mother Possible Resolutions to [employer] Focus: Dietary education, regular monitoring of BP, CBGs, Cr, caregiver training of mother,   Continued Need for Acute Rehabilitation Level of Care: The patient requires daily medical management by a physician with specialized training in physical medicine and rehabilitation for the following reasons: Direction of a multidisciplinary physical rehabilitation program to maximize functional independence : Yes  Medical management of patient stability for increased activity during participation in an intensive rehabilitation regime.: Yes Analysis of laboratory values and/or radiology reports with any subsequent need for medication adjustment and/or medical intervention. : Yes   I attest that I was present,  lead the team conference, and concur with the assessment and plan of the team.   Dorien Chihuahua B 05/11/2020, 1:50 PM

## 2020-05-11 NOTE — Progress Notes (Addendum)
[facility] PHYSICAL MEDICINE & REHABILITATION PROGRESS NOTE   Subjective/Complaints: No complaints this morning. Denies pain, constipation, insomnia.  Discussed that BP is still elevated- monitor for effects of Hydralazine. Discussed elevated CBGs and provided dietary counseling.  ROS: Denies CP, SOB, N/V/D  Objective:   No results found. Recent Labs    05/09/20 0628 05/10/20 0549  WBC 13.4* 11.4*  HGB 8.9* 9.1*  HCT 28.1* 28.8*  PLT 531* 556*   Recent Labs    05/09/20 0628 05/11/20 0017  NA 137 138  K 3.9 3.6  CL 103 105  CO2 28 23  GLUCOSE 145* 197*  BUN 43* 43*  CREATININE 2.99* 2.86*  CALCIUM 9.0 9.0    Intake/Output Summary (Last 24 hours) at 05/11/2020 0912 Last data filed at 05/11/2020 0600 Gross per 24 hour  Intake 600 ml  Output 750 ml  Net -150 ml        Physical Exam: Vital Signs Blood pressure (!) 151/76, pulse 74, temperature 98.3 F (36.8 C), resp. rate 17, height 5\' 4"  (1.626 m), weight 72.9 kg, SpO2 99 %.  General: Alert and oriented x 3, No apparent distress HEENT: Head is normocephalic, atraumatic, PERRLA, EOMI, sclera anicteric, oral mucosa pink and moist, dentition intact, ext ear canals clear,  Neck: Supple without JVD or lymphadenopathy Heart: Reg rate and rhythm. No murmurs rubs or gallops Chest: CTA bilaterally without wheezes, rales, or rhonchi; no distress Abdomen: Soft, non-tender, non-distended, bowel sounds positive. Extremities: No clubbing, cyanosis, or edema. Pulses are 2+ Psych: Normal mood.  Normal behavior. Musc: No edema in extremities.  No tenderness in extremities. Neuro: Alert Motor: RUE/RLE: 4-4+/5 proximal distal LUE: Shoulder abduction 2/5, distally 0/5, unchanged Left lower extremity: Hip flexion, knee extension 3/5, ankle dorsiflexion 0/5, unchanged   Assessment/Plan: 1. Functional deficits secondary to RIght corona radiata infarct which require 3+ hours per day of interdisciplinary therapy in a  comprehensive inpatient rehab setting.  Physiatrist is providing close team supervision and 24 hour management of active medical problems listed below.  Physiatrist and rehab team continue to assess barriers to discharge/monitor patient progress toward functional and medical goals  Care Tool:  Bathing  Bathing activity did not occur:  (N/A) Body parts bathed by patient: Right arm, Left arm, Chest, Abdomen   Body parts bathed by helper: Right arm     Bathing assist Assist Level: Set up assist     Upper Body Dressing/Undressing Upper body dressing   What is the patient wearing?: Pull over shirt    Upper body assist Assist Level: Set up assist    Lower Body Dressing/Undressing Lower body dressing      What is the patient wearing?: Pants, Underwear/pull up     Lower body assist Assist for lower body dressing: Contact Guard/Touching assist     Toileting Toileting    Toileting assist Assist for toileting: Supervision/Verbal cueing Assistive Device Comment:  (Urinal)   Transfers Chair/bed transfer  Transfers assist     Chair/bed transfer assist level: Minimal Assistance - Patient > 75%     Locomotion Ambulation   Ambulation assist      Assist level: Minimal Assistance - Patient > 75% Assistive device: Walker-rolling Max distance: 73ft   Walk 10 feet activity   Assist     Assist level: Minimal Assistance - Patient > 75% Assistive device: Walker-rolling   Walk 50 feet activity   Assist Walk 50 feet with 2 turns activity did not occur: Safety/medical concerns  Assist level: Minimal Assistance -  Patient > 75% Assistive device: Walker-rolling    Walk 150 feet activity   Assist Walk 150 feet activity did not occur: Safety/medical concerns         Walk 10 feet on uneven surface  activity   Assist Walk 10 feet on uneven surfaces activity did not occur: Safety/medical concerns         Wheelchair     Assist Will patient use  wheelchair at discharge?: No Type of Wheelchair: Manual    Wheelchair assist level: Supervision/Verbal cueing Max wheelchair distance: 125ft    Wheelchair 50 feet with 2 turns activity    Assist        Assist Level: Supervision/Verbal cueing   Wheelchair 150 feet activity     Assist      Assist Level: Supervision/Verbal cueing   Blood pressure (!) 151/76, pulse 74, temperature 98.3 F (36.8 C), resp. rate 17, height 5\' 4"  (1.626 m), weight 72.9 kg, SpO2 99 %.    Medical Problem List and Plan: 1.  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   -Interdisciplinary Team Conference today   WHO/PRAFO nightly 2.  Antithrombotics: -DVT/anticoagulation: Lovenox- discontinued sine ambulating >150 feet.             -antiplatelet therapy: Aspirin 81 mg daily 3. Pain Management: Pain well controlled.   4. Mood: Provide emotional support             -antipsychotic agents: N/A 5. Neuropsych: This patient is capable of making decisions on his own behalf. 6. Skin/Wound Care: Routine skin checks 7. Fluids/Electrolytes/Nutrition: Routine in and outs.   8.  Hypertension.    Lisinopril PTA, on hold due to AKI  Norvasc 10 mg daily  Lopressor 12.5 mg twice daily started on 9/21, increased to 50 twice daily on 9/24, increase to 75 on 9/29, increased to 100 BID on 10/2  Hydralazine 10 3 times daily started on 10/5  10/6: BP continues to be uncontrolled- monitor for effects of yesterday's hydralazine initiation.  Vitals:   05/10/20 1928 05/11/20 0619  BP: (!) 154/76 (!) 151/76  Pulse: 78 74  Resp:  17  Temp: 98.5 F (36.9 C) 98.3 F (36.8 C)  SpO2: 100% 99%   9.  Diabetes mellitus with hyperglycemia.  Hemoglobin A1c 9.6.  Check blood sugars before meals and at bedtime.  Diabetic teaching  Lantus increased to 10 units on 10/6 CBG (last 3)  Recent Labs    05/10/20 1646  05/10/20 2132 05/11/20 0626  GLUCAP 104* 204* 117*   10/6: CBG up to 204 last night. Continue to monitor with recent Lantus increase. Provided dietary counseling 10.  AKI on CKD stage III.  Creatinine baseline 2.42 08/02/2018.  Renal ultrasound 04/17/2019 showed no hydronephrosis or shadowing stone.  Started on sodium bicarbonate 1300 mg twice daily 04/17/2020.    Creatinine 2.99 on 10/4, labs ordered for tomorrow  EF reviewed-60-65%  IVF nightly x3 nights ordered on 9/22 (complete)  -appreciate nephrology follow up 11.  Tobacco abuse.  Counsel 12.  Hyperlipidemia: Lipitor 13.  Slow transit constipation  Improved 14. Leukocytosis:   WBCs 11.4 on 10/5  Afebrile  Continue to monitor 15.  Acute blood loss anemia?  On chronic anemia  Hemoglobin 9.1 on 10/5 16.  Severe hypoalbuminemia  Supplement initiated on 9/21 17.  Thrombocytosis  Platelets 556 on 10/5  Continue to monitor  LOS: 19 days A FACE TO FACE EVALUATION  WAS PERFORMED  Tony Schmitt 05/11/2020, 9:12 AM

## 2020-05-12 DIAGNOSIS — E1165 Type 2 diabetes mellitus with hyperglycemia: Secondary | ICD-10-CM

## 2020-05-12 LAB — GLUCOSE, CAPILLARY: Glucose-Capillary: 116 mg/dL — ABNORMAL HIGH (ref 70–99)

## 2020-05-12 NOTE — Plan of Care (Signed)
  Problem: Consults Goal: RH STROKE PATIENT EDUCATION Description: See Patient Education module for education specifics  Outcome: Completed/Met   Problem: RH BOWEL ELIMINATION Goal: RH STG MANAGE BOWEL WITH ASSISTANCE Description: STG Manage Bowel with supervision/min Assistance. Outcome: Completed/Met Goal: RH STG MANAGE BOWEL W/MEDICATION W/ASSISTANCE Description: STG Manage Bowel with Medication with supervision/min Assistance. Outcome: Completed/Met   Problem: RH BLADDER ELIMINATION Goal: RH STG MANAGE BLADDER WITH ASSISTANCE Description: STG Manage Bladder With supervision/min Assistance Outcome: Completed/Met   Problem: RH SKIN INTEGRITY Goal: RH STG MAINTAIN SKIN INTEGRITY WITH ASSISTANCE Description: STG Maintain Skin Integrity With supervision/min Assistance. Outcome: Completed/Met   Problem: RH SAFETY Goal: RH STG ADHERE TO SAFETY PRECAUTIONS W/ASSISTANCE/DEVICE Description: STG Adhere to Safety Precautions With supervision/min Assistance and appropriate assistive Device. Outcome: Completed/Met   Problem: RH PAIN MANAGEMENT Goal: RH STG PAIN MANAGED AT OR BELOW PT'S PAIN GOAL Description: <3 on a 0-10 pain scale. Outcome: Completed/Met   Problem: RH KNOWLEDGE DEFICIT Goal: RH STG INCREASE KNOWLEDGE OF HYPERTENSION Description: Patient will demonstrate knowledge of HTN medications, dietary restrictions, BP parameters, and follow up care with the MD with supervision/min assist from CIR staff at discharge. Outcome: Completed/Met Goal: RH STG INCREASE KNOWLEGDE OF HYPERLIPIDEMIA Description: Patient will demonstrate knowledge of HLD medications, and follow up care with the MD with supervision/min from CIR staff at discharge. Outcome: Completed/Met Goal: RH STG INCREASE KNOWLEDGE OF STROKE PROPHYLAXIS Description: Patient will demonstrate knowledge of secondary medications used to prevent future strokes with supervision/min from CIR staff at discharge. Outcome:  Completed/Met   Problem: RH KNOWLEDGE DEFICIT Goal: RH STG INCREASE KNOWLEDGE OF DIABETES Description: Patient will demonstrate knowledge of diabetes medications, dietary restrictions, blood sugar parameters, and follow up are with the MD with supervision/min from CIR staff at discharge. Outcome: Completed/Met

## 2020-05-12 NOTE — Progress Notes (Signed)
Pt discharge education completed by PA. Diet education printed and given to pt and family. Pt and family assisted to pack belongings and assisted to vehicle by staff.

## 2020-05-12 NOTE — Progress Notes (Signed)
[facility] PHYSICAL MEDICINE & REHABILITATION PROGRESS NOTE   Subjective/Complaints: Patient seen laying in bed this morning.  He states he slept well overnight.  He states he is ready for discharge.  ROS: Denies CP, SOB, N/V/D  Objective:   No results found. Recent Labs    05/10/20 0549  WBC 11.4*  HGB 9.1*  HCT 28.8*  PLT 556*   Recent Labs    05/11/20 0017 05/11/20 1040  NA 138 139  K 3.6 4.1  CL 105 105  CO2 23 25  GLUCOSE 197* 169*  BUN 43* 39*  CREATININE 2.86* 2.84*  CALCIUM 9.0 9.1    Intake/Output Summary (Last 24 hours) at 05/12/2020 1728 Last data filed at 05/12/2020 0739 Gross per 24 hour  Intake 360 ml  Output 950 ml  Net -590 ml        Physical Exam: Vital Signs Blood pressure (!) 150/82, pulse 72, temperature 98.7 F (37.1 C), resp. rate 18, height 5\' 4"  (1.626 m), weight 72.9 kg, SpO2 97 %.  Constitutional: No distress . Vital signs reviewed. HENT: Normocephalic.  Atraumatic. Eyes: EOMI. No discharge. Cardiovascular: No JVD.  RRR. Respiratory: Normal effort.  No stridor.  Bilateral clear to auscultation. GI: Non-distended.  BS +. Skin: Warm and dry.  Intact. Psych: Normal mood.  Normal behavior. Musc: No edema in extremities.  No tenderness in extremities. Neuro: Alert Motor: RUE/RLE: 4-4+/5 proximal distal LUE: Shoulder abduction 2/5, distally 0/5, stable Left lower extremity: Hip flexion, knee extension 3/5, ankle dorsiflexion 0/5, stable  Assessment/Plan: 1. Functional deficits secondary to RIght corona radiata infarct which require 3+ hours per day of interdisciplinary therapy in a comprehensive inpatient rehab setting.  Physiatrist is providing close team supervision and 24 hour management of active medical problems listed below.  Physiatrist and rehab team continue to assess barriers to discharge/monitor patient progress toward functional and medical goals  Care Tool:  Bathing  Bathing activity did not occur:  (N/A) Body  parts bathed by patient: Right arm, Left arm, Chest, Abdomen, Front perineal area, Right upper leg, Buttocks, Left upper leg, Right lower leg, Left lower leg, Face   Body parts bathed by helper: Right arm     Bathing assist Assist Level: Supervision/Verbal cueing     Upper Body Dressing/Undressing Upper body dressing   What is the patient wearing?: Pull over shirt    Upper body assist Assist Level: Supervision/Verbal cueing    Lower Body Dressing/Undressing Lower body dressing      What is the patient wearing?: Pants, Underwear/pull up     Lower body assist Assist for lower body dressing: Supervision/Verbal cueing     Toileting Toileting    Toileting assist Assist for toileting: Supervision/Verbal cueing Assistive Device Comment:  (Urinal)   Transfers Chair/bed transfer  Transfers assist     Chair/bed transfer assist level: Contact Guard/Touching assist     Locomotion Ambulation   Ambulation assist      Assist level: Minimal Assistance - Patient > 75% Assistive device: Walker-rolling Max distance: 182ft   Walk 10 feet activity   Assist     Assist level: Minimal Assistance - Patient > 75% Assistive device: Walker-rolling   Walk 50 feet activity   Assist Walk 50 feet with 2 turns activity did not occur: Safety/medical concerns  Assist level: Minimal Assistance - Patient > 75% Assistive device: Walker-rolling    Walk 150 feet activity   Assist Walk 150 feet activity did not occur: Safety/medical concerns  Assist level: Minimal Assistance -  Patient > 75% Assistive device: Walker-rolling    Walk 10 feet on uneven surface  activity   Assist Walk 10 feet on uneven surfaces activity did not occur: Safety/medical concerns   Assist level: Minimal Assistance - Patient > 75% Assistive device: Aeronautical engineer Will patient use wheelchair at discharge?: Yes Type of Wheelchair: Manual    Wheelchair assist level:  Independent Max wheelchair distance: 163ft    Wheelchair 50 feet with 2 turns activity    Assist        Assist Level: Independent   Wheelchair 150 feet activity     Assist      Assist Level: Independent   Blood pressure (!) 150/82, pulse 72, temperature 98.7 F (37.1 C), resp. rate 18, height 5\' 4"  (1.626 m), weight 72.9 kg, SpO2 97 %.    Medical Problem List and Plan: 1.  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   DC today  Will see patient for transitional care management in 1-2 weeks post-discharge  WHO/PRAFO nightly 2.  Antithrombotics: -DVT/anticoagulation: Lovenox- discontinued sine ambulating >150 feet.             -antiplatelet therapy: Aspirin 81 mg daily 3. Pain Management:   Controlled on 10/7 4. Mood: Provide emotional support             -antipsychotic agents: N/A 5. Neuropsych: This patient is capable of making decisions on his own behalf. 6. Skin/Wound Care: Routine skin checks 7. Fluids/Electrolytes/Nutrition: Routine in and outs.   8.  Hypertension.    Lisinopril PTA, on hold due to AKI  Norvasc 10 mg daily  Lopressor 12.5 mg twice daily started on 9/21, increased to 50 twice daily on 9/24, increase to 75 on 9/29, increased to 100 BID on 10/2  Hydralazine 10 3 times daily started on 10/5  Mildly elevated on 10/7, continue to monitor in ambulatory setting Vitals:   05/11/20 2001 05/12/20 0416  BP: (!) 151/76 (!) 150/82  Pulse: 73 72  Resp: 18 18  Temp: 98.2 F (36.8 C) 98.7 F (37.1 C)  SpO2: 100% 97%   9.  Diabetes mellitus with hyperglycemia.  Hemoglobin A1c 9.6.  Check blood sugars before meals and at bedtime.  Diabetic teaching  Lantus increased to 10 units on 10/6 CBG (last 3)  Recent Labs    05/11/20 1640 05/11/20 2112 05/12/20 0552  GLUCAP 181* 190* 116*   Labile on 10/7, monitor in ambulatory setting for further  changes 10.  AKI on CKD stage III.  Creatinine baseline 2.42 08/02/2018.  Renal ultrasound 04/17/2019 showed no hydronephrosis or shadowing stone.  Started on sodium bicarbonate 1300 mg twice daily 04/17/2020.    Creatinine 2.84 on 10/6  EF reviewed-60-65%  IVF nightly x3 nights ordered on 9/22 (complete)  -appreciate nephrology follow up 11.  Tobacco abuse.  Counsel 12.  Hyperlipidemia: Lipitor 13.  Slow transit constipation  Improved 14. Leukocytosis:   WBCs 11.4 on 10/5  Afebrile  Continue to monitor 15.  Acute blood loss anemia?  On chronic anemia  Hemoglobin 9.1 on 10/5 16.  Severe hypoalbuminemia  Supplement initiated on 9/21 17.  Thrombocytosis  Platelets 556 on 10/5  Continue to monitor  > 30 minutes spent in total in discharge planning between myself and PA regarding aforementioned, as well discussion regarding DME equipment, follow-up appointments, follow-up therapies, discharge medications, discharge recommendations  LOS: 20 days A  FACE TO FACE EVALUATION WAS PERFORMED  Amaya Blakeman [name] 05/12/2020, 5:28 PM

## 2020-05-16 ENCOUNTER — Telehealth: Payer: Self-pay | Admitting: Registered Nurse

## 2020-05-16 NOTE — Telephone Encounter (Signed)
TC Call placed to Tony Schmitt cell number, recording reports not accepting calls. Left a message n the home number to return the call. Awaiting a return call.

## 2020-05-16 NOTE — Telephone Encounter (Signed)
Transitional Care call  Patient name: Tony Schmitt  DOB: June 24, 1971 1. Are you/is patient experiencing any problems since coming home? No a. Are there any questions regarding any aspect of care? No 2. Are there any questions regarding medications administration/dosing? No a. Are meds being taken as prescribed? Yes b. "Patient should review meds with caller to confirm" Medication List Reviewed 3. Have there been any falls? No 4. Has Home Health been to the house and/or have they contacted you? Yes, False Pass: Came to the home x1, he was given exercise worksheets.  a. If not, have you tried to contact them? NA b. Can we help you contact them? NA 5. Are bowels and bladder emptying properly? Yes a. Are there any unexpected incontinence issues? No b. If applicable, is patient following bowel/bladder programs? NA 6. Any fevers, problems with breathing, unexpected pain? No 7. Are there any skin problems or new areas of breakdown? No 8. Has the patient/family member arranged specialty MD follow up (ie cardiology/neurology/renal/surgical/etc.)?  Mr. Ferg and his mother was instructed to call Dr Curt Bears and Dr Jonnie Finner to schedule HFU appointments, they verbalize understanding.  a. Can we help arrange? No 9. Does the patient need any other services or support that we can help arrange? No 10. Are caregivers following through as expected in assisting the patient? Yes 11. Has the patient quit smoking, drinking alcohol, or using drugs as recommended? (                        )  Appointment date/time 05/23/2020  arrival time 9:40 for 10:00 with Dr Posey Pronto. At [facility]

## 2020-05-20 NOTE — Progress Notes (Signed)
Cardiology Office Note Date:  05/23/2020  Patient ID:  Tony Schmitt, DOB 20-Jun-1971, MRN [id number] PCP:  Charlott Rakes, MD  Cardiologist:  none Electrophysiologist: Dr. Curt Bears (hospital consult 04/2020)     Chief Complaint:  f/u on possible loop implant  History of Present Illness: Tony Schmitt is a 49 y.o. male with history of HTN, HLD, DM, CKD, and most recently embolic (cryptogenic) stroke  The patient comes in today to revisit loop implant after having suffered a cryptogenic stroke (multiple b/l infarcts) Sep 2021.  EP was consulted for loop implant though he was without insurance and planned to follow up outpatient.   He is accompanied by his Mom today.  She mentions that it seemed he was not a medicaid candidate because her had an income prior to his stroke and she has gotten the paperwork to start to apply for disability. They saw Dr. Posey Pronto yesterday, his hydralazine was increased for HTN.  The patient mentions he is swollen, ongoing since his rehab stay, not changed, not better.  Including his L hand, they report that Dr. Posey Pronto instructed him to wear his L arm brace and elevate his arm when seated/laying down. He denies any kind of SOB, he reports ambulating slowly for about 20 minutes maybe 2x a day with SOB, no symptoms of orthopnea or PND. No CP, no palpitations or cardiac awareness No near syncope or syncope.   Past Medical History:  Diagnosis Date  . Diabetes mellitus without complication ([place])   . Hypertension     Past Surgical History:  Procedure Laterality Date  . BUBBLE STUDY  04/18/2020   Procedure: BUBBLE STUDY;  Surgeon: Sueanne Margarita, MD;  Location: [facility];  Service: Cardiovascular;;  . TEE WITHOUT CARDIOVERSION N/A 04/18/2020   Procedure: TRANSESOPHAGEAL ECHOCARDIOGRAM (TEE);  Surgeon: Sueanne Margarita, MD;  Location: [facility] ENDOSCOPY;  Service: Cardiovascular;  Laterality: N/A;    Current Outpatient Medications  Medication Sig Dispense Refill  .  acetaminophen (TYLENOL) 325 MG tablet Take 2 tablets (650 mg total) by mouth every 4 (four) hours as needed for mild pain (or temp > 37.5 C (99.5 F)).    Marland Kitchen amLODipine (NORVASC) 10 MG tablet Take 1 tablet (10 mg total) by mouth daily. 30 tablet 0  . aspirin 81 MG chewable tablet Chew 1 tablet (81 mg total) by mouth daily. 30 tablet 0  . atorvastatin (LIPITOR) 80 MG tablet Take 1 tablet (80 mg total) by mouth at bedtime. 30 tablet 0  . glipiZIDE (GLUCOTROL) 5 MG tablet TAKE 1 TABLET BY MOUTH TWICE DAILY BEFORE A MEAL 60 tablet 0  . hydrALAZINE (APRESOLINE) 25 MG tablet Take 1 tablet (25 mg total) by mouth 3 (three) times daily. 90 tablet 0  . lisinopril (ZESTRIL) 20 MG tablet Take 1 tablet by mouth daily.     . metoprolol tartrate (LOPRESSOR) 100 MG tablet Take 1 tablet (100 mg total) by mouth 2 (two) times daily. 60 tablet 0  . omeprazole (PRILOSEC) 40 MG capsule Take 1 capsule (40 mg total) by mouth daily. 30 capsule 3  . polyethylene glycol (MIRALAX / GLYCOLAX) 17 g packet Take 17 g by mouth 2 (two) times daily. 14 each 0  . senna-docusate (SENOKOT-S) 8.6-50 MG tablet Take 2 tablets by mouth at bedtime.    . sodium bicarbonate 650 MG tablet Take 2 tablets (1,300 mg total) by mouth 2 (two) times daily. 120 tablet 0   No current facility-administered medications for this visit.  Allergies:   Patient has no known allergies.   Social History:  The patient  reports that he has quit smoking. His smoking use included cigarettes and cigars. He has never used smokeless tobacco. He reports previous alcohol use. He reports that he does not use drugs.   Family History:  The patient's family history includes Cancer in his father; Diabetes in his father and mother; Heart failure in an other family member; Hypertension in his father.  ROS:  Please see the history of present illness.    All other systems are reviewed and otherwise negative.   PHYSICAL EXAM:  VS:  BP (!) 164/90   Pulse 75   Ht 5\' 4"   (1.626 m)   Wt 170 lb (77.1 kg)   SpO2 98%   BMI 29.18 kg/m  BMI: Body mass index is 29.18 kg/m. Well nourished, well developed, in no acute distress HEENT: normocephalic, atraumatic Neck: no JVD, carotid bruits or masses Cardiac:  RRR; no significant murmurs, no rubs, or gallops Lungs:  CTA b/l, no wheezing, rhonchi or rales Abd: soft, nontender MS: no deformity or atrophy Ext: non-pitting edema b/l LE, dry skin, R hand is edematous, nonpitting also Skin: warm and dry, no rash Neuro:  Uses his R arm to lift his left, moves his L leg well, no full neuro exam completed today Psych: euthymic mood, full affect     EKG:  Not done today, his rhythm is regular   04/16/2020 IMPRESSIONS  1. Left ventricular ejection fraction, by estimation, is 60 to 65%. The left ventricle has normal function. The left ventricle has no regional wall motion abnormalities. There is mild concentric left ventricular hypertrophy. Left ventricular diastolic function could not be evaluated.  2. Right ventricular systolic function is normal. The right ventricular size is normal. Tricuspid regurgitation signal is inadequate for assessing PA pressure.  3. The mitral valve is normal in structure. No evidence of mitral valve regurgitation. No evidence of mitral stenosis.  4. The aortic valve is normal in structure. Aortic valve regurgitation is not visualized. No aortic stenosis is present.  5. The inferior vena cava is normal in size with greater than 50% respiratory variability, suggesting right atrial pressure of 3 mmHg.   04/18/2020: TEE IMPRESSIONS  1. Left ventricular ejection fraction, by estimation, is 60 to 65%. The  left ventricle has normal function. The left ventricle has no regional  wall motion abnormalities.  2. Right ventricular systolic function is normal. The right ventricular  size is normal.  3. No left atrial/left atrial appendage thrombus was detected.  4. The mitral valve is normal in  structure. Trivial mitral valve  regurgitation. No evidence of mitral stenosis.  5. The aortic valve is normal in structure. Aortic valve regurgitation is  not visualized. No aortic stenosis is present.  6. The inferior vena cava is normal in size with greater than 50%  respiratory variability, suggesting  7. Possible very small PFO noted with agitated saline contrast study with  late bubbles after 7 cardiac cycles and Valsalva.  Conclusion(s)/Recommendation(s): Normal biventricular function without  evidence of hemodynamically significant valvular heart disease.   04/18/2020: LE venous doppler Summary:  BILATERAL:  - No evidence of deep vein thrombosis seen in the lower extremities,  bilaterally.  - No evidence of superficial venous thrombosis in the lower extremities,  bilaterally.    04/18/2020; TCD Summary:  No HITS at rest or during Valsalva. Negative transcranial Doppler Bubble  study with no evidence of right to left  intracardiac communication.   Recent Labs: 04/16/2020: TSH 2.223 04/17/2020: Magnesium 1.8 04/25/2020: ALT 34 05/10/2020: Hemoglobin 9.1; Platelets 556 05/11/2020: BUN 39; Creatinine, Ser 2.84; Potassium 4.1; Sodium 139  04/16/2020: Cholesterol 699; HDL 40; LDL Cholesterol 599; Total CHOL/HDL Ratio 17.5; Triglycerides 298; VLDL 60   Estimated Creatinine Clearance: 29.2 mL/min (A) (by C-G formula based on SCr of 2.84 mg/dL (H)).   Wt Readings from Last 3 Encounters:  05/23/20 170 lb (77.1 kg)  05/23/20 150 lb (68 kg)  04/29/20 160 lb 11.5 oz (72.9 kg)     Other studies reviewed: Additional studies/records reviewed today include: summarized above  ASSESSMENT AND PLAN:  1. Cryptogenic stroke      We revisited the role for heart rhythm monitoring today in the environment of his stroke, discussed both 30 day and loop. Both the patient and his Mom would like to pursue loop implant once his insurance is in place.  [facility], they are instructed to bring his insurance/finance paper work, I think they have [place] that may be able to help them get started and maneuver through the system.  He is pending nephrology as well, will defer hos BP to PMD and nephrology.  They will call us once ready to schedule implant with Dr. Curt Bears.    Disposition: as above  Current medicines are reviewed at length with the patient today.  The patient did not have any concerns regarding medicines.  Venetia Night, PA-C 05/23/2020 12:24 PM     [address] [place] [address] [phone] (office)  [phone] (fax)

## 2020-05-23 ENCOUNTER — Encounter: Payer: Self-pay | Attending: Physical Medicine & Rehabilitation | Admitting: Physical Medicine & Rehabilitation

## 2020-05-23 ENCOUNTER — Encounter: Payer: Self-pay | Admitting: Physical Medicine & Rehabilitation

## 2020-05-23 ENCOUNTER — Telehealth: Payer: Self-pay | Admitting: *Deleted

## 2020-05-23 ENCOUNTER — Other Ambulatory Visit: Payer: Self-pay

## 2020-05-23 ENCOUNTER — Ambulatory Visit (INDEPENDENT_AMBULATORY_CARE_PROVIDER_SITE_OTHER): Payer: Self-pay | Admitting: Physician Assistant

## 2020-05-23 VITALS — BP 164/90 | HR 75 | Ht 64.0 in | Wt 170.0 lb

## 2020-05-23 VITALS — BP 167/95 | HR 77 | Temp 98.5°F | Ht 64.0 in | Wt 150.0 lb

## 2020-05-23 DIAGNOSIS — R269 Unspecified abnormalities of gait and mobility: Secondary | ICD-10-CM | POA: Insufficient documentation

## 2020-05-23 DIAGNOSIS — I10 Essential (primary) hypertension: Secondary | ICD-10-CM | POA: Insufficient documentation

## 2020-05-23 DIAGNOSIS — N1831 Chronic kidney disease, stage 3a: Secondary | ICD-10-CM | POA: Insufficient documentation

## 2020-05-23 DIAGNOSIS — I639 Cerebral infarction, unspecified: Secondary | ICD-10-CM | POA: Insufficient documentation

## 2020-05-23 DIAGNOSIS — E1165 Type 2 diabetes mellitus with hyperglycemia: Secondary | ICD-10-CM | POA: Insufficient documentation

## 2020-05-23 DIAGNOSIS — N179 Acute kidney failure, unspecified: Secondary | ICD-10-CM | POA: Insufficient documentation

## 2020-05-23 MED ORDER — HYDRALAZINE HCL 25 MG PO TABS: 25.0000 mg | ORAL_TABLET | Freq: Three times a day (TID) | ORAL | 0 refills | Status: DC

## 2020-05-23 NOTE — Telephone Encounter (Signed)
Faxed signed certification and plan of care forms to [facility] [phone]). Confirmation page 1:57 pm.

## 2020-05-23 NOTE — Progress Notes (Signed)
Subjective:    Patient ID: Tony Schmitt, male    DOB: [birth year], 49 y.o.   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   Admit date: 04/22/2020 Discharge date: 05/12/2020  Mother supplements history. At discharge he was instructed to follow up with Nephro, but does not have an appointment.  He is scheduled with his PCP. He does have an appointment with Cards. BP is elevated. CBGs ~140. Denies tobacco abuse.  Ambulates with walker.  Falls x2, trying to pick something up off the floor in an unlocked wheelchair and then trying to lay down on bed and slid down.  Therapies: Does not have insurance DME: Previously owned Mobility: Tourist information centre manager  Pain Inventory Average Pain 0 Pain Right Now 0 My pain is intermittent, sharp and shocking pain.  LOCATION OF PAIN  Left  & Right ankles  BOWEL Number of stools per week: 2 Oral laxative use Yes  Type of laxative Miralax Enema or suppository use No  History of colostomy No  Incontinent Yes   BLADDER Normal In and out cath, frequency N/A Able to self cath N/A Bladder incontinence No  Frequent urination Yes  Leakage with coughing No  Difficulty starting stream No  Incomplete bladder emptying Yes    Mobility use a cane how many minutes can you walk? 20 MINS ability to climb steps?  yes do you drive?  no use a wheelchair needs help with transfers transfers alone Do you have any goals in this area?  yes  Function disabled: date disabled Disable since 2019  Neuro/Psych tingling trouble walking  Prior Studies None since discharge,  Physicians involved in your care None since discharge,   Family History  Problem Relation Age of Onset  .  Diabetes Mother   . Diabetes Father   . Hypertension Father   . Cancer Father   . Heart failure Other    Social History   Socioeconomic History  . Marital status: Single    Spouse name: Not on file  . Number of children: Not on file  . Years of education: Not on file  . Highest education level: Not on file  Occupational History  . Not on file  Tobacco Use  . Smoking status: Former Smoker    Types: Cigarettes, Cigars  . Smokeless tobacco: Never Used  Vaping Use  . Vaping Use: Never used  Substance and Sexual Activity  . Alcohol use: Not Currently  . Drug use: Never  . Sexual activity: Not on file  Other Topics Concern  . Not on file  Social History Narrative  . Not on file   Social Determinants of Health   Financial Resource Strain:   . Difficulty of Paying Living Expenses: Not on file  Food Insecurity:   . Worried About Charity fundraiser in the Last Year: Not on file  . Ran Out of Food in the Last Year: Not on file  Transportation Needs:   . Lack of Transportation (Medical): Not on file  . Lack of Transportation (Non-Medical): Not on file  Physical Activity:   . Days of Exercise per Week: Not on file  . Minutes of Exercise per Session: Not on file  Stress:   . Feeling of Stress : Not on file  Social Connections:   .  Frequency of Communication with Friends and Family: Not on file  . Frequency of Social Gatherings with Friends and Family: Not on file  . Attends Religious Services: Not on file  . Active Member of Clubs or Organizations: Not on file  . Attends Archivist Meetings: Not on file  . Marital Status: Not on file   Past Surgical History:  Procedure Laterality Date  . BUBBLE STUDY  04/18/2020   Procedure: BUBBLE STUDY;  Surgeon: Sueanne Margarita, MD;  Location: [place];  Service: Cardiovascular;;  . TEE WITHOUT CARDIOVERSION N/A 04/18/2020   Procedure: TRANSESOPHAGEAL ECHOCARDIOGRAM (TEE);  Surgeon: Sueanne Margarita, MD;  Location: [facility]  ENDOSCOPY;  Service: Cardiovascular;  Laterality: N/A;   Past Medical History:  Diagnosis Date  . Diabetes mellitus without complication ([place])   . Hypertension    BP (!) 167/95   Pulse 77   Temp 98.5 F (36.9 C)   Ht 5\' 4"  (1.626 m)   Wt 150 lb (68 kg)   SpO2 96%   BMI 25.75 kg/m   Opioid Risk Score:   Fall Risk Score:  `1  Depression screen PHQ 2/9  Depression screen [facility] 2/9 05/23/2020 08/19/2019 05/21/2019 04/23/2019 04/16/2019 08/29/2018 03/27/2018  Decreased Interest 0 0 0 0 0 0 0  Down, Depressed, Hopeless 0 0 0 0 0 0 0  PHQ - 2 Score 0 0 0 0 0 0 0  Altered sleeping 1 - - - - - -  Tired, decreased energy 0 - - - - - -  Change in appetite 0 - - - - - -  Feeling bad or failure about yourself  0 - - - - - -  Trouble concentrating 0 - - - - - -  Moving slowly or fidgety/restless 0 - - - - - -  Suicidal thoughts 0 - - - - - -  PHQ-9 Score 1 - - - - - -   Review of Systems  Constitutional: Negative.   HENT: Negative.   Eyes: Negative.   Respiratory: Negative.   Cardiovascular: Positive for leg swelling.  Endocrine: Negative.   Genitourinary: Negative.   Musculoskeletal: Positive for arthralgias and gait problem. Negative for myalgias.       Pain in both ankles  Skin: Negative.   Allergic/Immunologic: Negative.   Neurological: Positive for weakness and numbness.  Hematological: Negative.   Psychiatric/Behavioral: Negative.   All other systems reviewed and are negative.     Objective:   Physical Exam  Constitutional: No distress . Vital signs reviewed. HENT: Normocephalic.  Atraumatic. Eyes: EOMI. No discharge. Cardiovascular: No JVD.   Respiratory: Normal effort.  No stridor.   GI: Non-distended.   Skin: Warm and dry.  Intact. Psych: Normal mood.  Normal behavior. Musc: Right hand > leg edema and tenderness. 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   1.  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              Cont HEP (does not have insurance for therapies)  Encouraged compliance  with WHO/PRAFO nightly  2.  Hypertension.    Elevated today, states runs this high  Norvasc 10 mg daily             Lopressor increased to 100 BID on 10/2             Hydralazine 10 3 times daily started on 10/5, increased to 25 TID  Encouraged follow up appointment with PCP  3.  Diabetes mellitus with hyperglycemia.    Mildly elevated  Follow up with PCP  4.  AKI on CKD stage III.    Renal ultrasound 04/17/2019 showed no hydronephrosis or shadowing stone.    Follow up with Nephro, needs appointment  5.  Tobacco abuse.    Continue abstinence  6. Gait abnormality  Cont wheelchair/walker for safety  Cont HEP  Meds reviewed Referrals reviewed - needs to follow up with Neurology, Cards, Nephro, PCP All questions answered

## 2020-05-23 NOTE — Patient Instructions (Addendum)
Medication Instructions:   Your physician recommends that you continue on your current medications as directed. Please refer to the Current Medication list given to you today.  *If you need a refill on your cardiac medications before your next appointment, please call your pharmacy*   Lab Work: [place]   If you have labs (blood work) drawn today and your tests are completely normal, you will receive your results only by: Marland Kitchen MyChart Message (if you have MyChart) OR . A paper copy in the mail If you have any lab test that is abnormal or we need to change your treatment, we will call you to review the results.   Testing/Procedures: NONE ORDERED  TODAY   Follow-Up: At [facility], you and your health needs are our priority.  As part of our continuing mission to provide you with exceptional heart care, we have created designated Provider Care Teams.  These Care Teams include your primary Cardiologist (physician) and Advanced Practice Providers (APPs -  Physician Assistants and Nurse Practitioners) who all work together to provide you with the care you need, when you need it.  We recommend signing up for the patient portal called "MyChart".  Sign up information is provided on this After Visit Summary.  MyChart is used to connect with patients for Virtual Visits (Telemedicine).  Patients are able to view lab/test results, encounter notes, upcoming appointments, etc.  Non-urgent messages can be sent to your provider as well.   To learn more about what you can do with MyChart, go to [email or web address].    Your next appointment:    CALL BACK TO SCHEDULE LOOP RECORDER ONCE INSURANCE COVERAGE IS UPDATED  MAKE KIDNEY SPECIALIST APPOINTMENT ALSO   Other Instructions

## 2020-05-26 ENCOUNTER — Ambulatory Visit: Payer: Self-pay | Attending: Family Medicine | Admitting: Family Medicine

## 2020-05-26 ENCOUNTER — Encounter: Payer: Self-pay | Admitting: Family Medicine

## 2020-05-26 ENCOUNTER — Other Ambulatory Visit: Payer: Self-pay

## 2020-05-26 VITALS — BP 165/90 | HR 74 | Ht 64.0 in | Wt 176.2 lb

## 2020-05-26 DIAGNOSIS — Z23 Encounter for immunization: Secondary | ICD-10-CM

## 2020-05-26 DIAGNOSIS — I129 Hypertensive chronic kidney disease with stage 1 through stage 4 chronic kidney disease, or unspecified chronic kidney disease: Secondary | ICD-10-CM

## 2020-05-26 DIAGNOSIS — Z8673 Personal history of transient ischemic attack (TIA), and cerebral infarction without residual deficits: Secondary | ICD-10-CM

## 2020-05-26 DIAGNOSIS — N184 Chronic kidney disease, stage 4 (severe): Secondary | ICD-10-CM

## 2020-05-26 DIAGNOSIS — K2100 Gastro-esophageal reflux disease with esophagitis, without bleeding: Secondary | ICD-10-CM

## 2020-05-26 DIAGNOSIS — I69354 Hemiplegia and hemiparesis following cerebral infarction affecting left non-dominant side: Secondary | ICD-10-CM

## 2020-05-26 DIAGNOSIS — E1122 Type 2 diabetes mellitus with diabetic chronic kidney disease: Secondary | ICD-10-CM

## 2020-05-26 DIAGNOSIS — E1165 Type 2 diabetes mellitus with hyperglycemia: Secondary | ICD-10-CM

## 2020-05-26 LAB — GLUCOSE, POCT (MANUAL RESULT ENTRY): POC Glucose: 100 mg/dl — AB (ref 70–99)

## 2020-05-26 MED ORDER — AMLODIPINE BESYLATE 10 MG PO TABS: 10.0000 mg | ORAL_TABLET | Freq: Every day | ORAL | 3 refills | Status: DC

## 2020-05-26 MED ORDER — OMEPRAZOLE 40 MG PO CPDR: 40.0000 mg | DELAYED_RELEASE_CAPSULE | Freq: Every day | ORAL | 3 refills | Status: DC

## 2020-05-26 MED ORDER — HYDRALAZINE HCL 25 MG PO TABS: 25.0000 mg | ORAL_TABLET | Freq: Three times a day (TID) | ORAL | 3 refills | Status: DC

## 2020-05-26 MED ORDER — ATORVASTATIN CALCIUM 80 MG PO TABS: 80.0000 mg | ORAL_TABLET | Freq: Every day | ORAL | 3 refills | Status: DC

## 2020-05-26 MED ORDER — GLIPIZIDE 5 MG PO TABS: 5.0000 mg | ORAL_TABLET | Freq: Two times a day (BID) | ORAL | 3 refills | Status: DC

## 2020-05-26 MED ORDER — CARVEDILOL 12.5 MG PO TABS: 12.5000 mg | ORAL_TABLET | Freq: Two times a day (BID) | ORAL | 3 refills | Status: DC

## 2020-05-26 NOTE — Progress Notes (Signed)
Subjective:  Patient ID: Tony Schmitt, male    DOB: 28-Dec-1970  Age: 49 y.o. MRN: [id number]  CC: Hospitalization Follow-up   HPI Tony Schmitt is a 49 year old male with a history of hypertension, stage IV CKD, type 2 diabetes mellitus (A1c 9.6) with recent hospitalization for acute/subacute nonhemorrhagic infarct in the posterior right corona radiata and anterior aspect of left internal capsule with residual left hemiparesis who presents to establish care. He is accompanied by mom to today's visit.  He had presented with significant elevated blood pressure, noncompliant with medications, left-sided weakness. CT head revealed: IMPRESSION: 1. Low-attenuation change in the right periventricular white matter suggesting acute infarct or possibly a mass lesion. Consider MRI for further evaluation. 2. Old lacunar infarcts in the left basal ganglia. 3. No acute intracranial hemorrhage. 4. Hypoaeration of the mastoid air cells with small mastoid effusions. Mucosal thickening in the right middle ear possibly representing inflammatory process or cholesteatoma.  MRI brain revealed: IMPRESSION: 1. Acute/subacute nonhemorrhagic infarcts involving the posterior right corona radiata and anterior aspect of the left external capsule, corresponding with patient's symptoms. Right-sided infarcts likely impacts cortical spinal tracts involving left motor function. 2. Punctate cortical infarct along the inferior left frontal operculum. 3. Periventricular and subcortical T2 signal changes are otherwise mildly advanced for age. This likely reflects the sequela of chronic microvascular ischemia. 4. No flow signal in the distal left vertebral artery above the craniocervical junction. 5. Moderate to high-grade stenosis at the junction at the right V3 and V4 segments with segmental narrowing of the V4 segment above this level. 6. Moderate proximal basilar artery stenosis. 7. Atherosclerotic  irregularity in the cavernous internal carotid arteries bilaterally with a 50-60% stenosis in the supraclinoid right ICA. 8. Moderate stenoses of the right posterior communicating artery with asymmetric attenuation of distal PCA branch vessels on the right. The right posterior cerebral artery is of fetal type. 9. No significant proximal stenosis in the neck.  TEE revealed EF of 60 to 65%, normal LV function, normal regional wall motion abnormalities, small PFO. Carotid Dopplers were negative for carotid artery stenosis. He was commenced on her antihypertensive regimen and diabetic regimen. Lisinopril was held due to CKD. Did not receive TPA. Subsequently transferred to CIR with recommendations to follow-up outpatient with nephrology, neurology and cardiology (for possible loop recorder).  Mom is in the process of contacting Dr Selinda Orion - Nephrology for an appointment. He had a visit with cardiology last week and as per notes loop recorder will be considered once he has insurance. Seen by rehab medicine last week.  Fasting sugars have been 170 and he has been compliant with glipizide. His blood pressure is also elevated and he endorses compliance with his antihypertensive. Commenced PT with [facility]. He has no acute concerns today.  Past Medical History:  Diagnosis Date  . Diabetes mellitus without complication ([place])   . Hypertension     Past Surgical History:  Procedure Laterality Date  . BUBBLE STUDY  04/18/2020   Procedure: BUBBLE STUDY;  Surgeon: Sueanne Margarita, MD;  Location: [place];  Service: Cardiovascular;;  . TEE WITHOUT CARDIOVERSION N/A 04/18/2020   Procedure: TRANSESOPHAGEAL ECHOCARDIOGRAM (TEE);  Surgeon: Sueanne Margarita, MD;  Location: [facility] ENDOSCOPY;  Service: Cardiovascular;  Laterality: N/A;    Family History  Problem Relation Age of Onset  . Diabetes Mother   . Diabetes Father   . Hypertension Father   . Cancer Father   . Heart failure  Other  No Known Allergies  Outpatient Medications Prior to Visit  Medication Sig Dispense Refill  . aspirin 81 MG chewable tablet Chew 1 tablet (81 mg total) by mouth daily. 30 tablet 0  . polyethylene glycol (MIRALAX / GLYCOLAX) 17 g packet Take 17 g by mouth 2 (two) times daily. 14 each 0  . senna-docusate (SENOKOT-S) 8.6-50 MG tablet Take 2 tablets by mouth at bedtime.    . sodium bicarbonate 650 MG tablet Take 2 tablets (1,300 mg total) by mouth 2 (two) times daily. 120 tablet 0  . amLODipine (NORVASC) 10 MG tablet Take 1 tablet (10 mg total) by mouth daily. 30 tablet 0  . atorvastatin (LIPITOR) 80 MG tablet Take 1 tablet (80 mg total) by mouth at bedtime. 30 tablet 0  . glipiZIDE (GLUCOTROL) 5 MG tablet TAKE 1 TABLET BY MOUTH TWICE DAILY BEFORE A MEAL 60 tablet 0  . hydrALAZINE (APRESOLINE) 25 MG tablet Take 1 tablet (25 mg total) by mouth 3 (three) times daily. 90 tablet 0  . metoprolol tartrate (LOPRESSOR) 100 MG tablet Take 1 tablet (100 mg total) by mouth 2 (two) times daily. 60 tablet 0  . omeprazole (PRILOSEC) 40 MG capsule Take 1 capsule (40 mg total) by mouth daily. 30 capsule 3  . acetaminophen (TYLENOL) 325 MG tablet Take 2 tablets (650 mg total) by mouth every 4 (four) hours as needed for mild pain (or temp > 37.5 C (99.5 F)).    Marland Kitchen lisinopril (ZESTRIL) 20 MG tablet Take 1 tablet by mouth daily.  (Patient not taking: Reported on 05/26/2020)     No facility-administered medications prior to visit.     ROS Review of Systems  Constitutional: Negative for activity change and appetite change.  HENT: Negative for sinus pressure and sore throat.   Eyes: Negative for visual disturbance.  Respiratory: Negative for cough, chest tightness and shortness of breath.   Cardiovascular: Negative for chest pain and leg swelling.  Gastrointestinal: Negative for abdominal distention, abdominal pain, constipation and diarrhea.  Endocrine: Negative.   Genitourinary: Negative for dysuria.   Musculoskeletal: Negative for joint swelling and myalgias.  Skin: Negative for rash.  Allergic/Immunologic: Negative.   Neurological: Positive for weakness. Negative for light-headedness and numbness.  Psychiatric/Behavioral: Negative for dysphoric mood and suicidal ideas.    Objective:  BP (!) 165/90   Pulse 74   Ht 5\' 4"  (1.626 m)   Wt 176 lb 3.2 oz (79.9 kg)   SpO2 98%   BMI 30.24 kg/m   BP/Weight 05/26/2020 05/23/2020 16/05/9603  Systolic BP 540 981 191  Diastolic BP 90 95 90  Wt. (Lbs) 176.2 150 170  BMI 30.24 25.75 29.18      Physical Exam Constitutional:      Appearance: He is well-developed.  Neck:     Vascular: No JVD.  Cardiovascular:     Rate and Rhythm: Normal rate.     Heart sounds: Normal heart sounds. No murmur heard.   Pulmonary:     Effort: Pulmonary effort is normal.     Breath sounds: Normal breath sounds. No wheezing or rales.  Chest:     Chest wall: No tenderness.  Abdominal:     General: Bowel sounds are normal. There is no distension.     Palpations: Abdomen is soft. There is no mass.     Tenderness: There is no abdominal tenderness.  Musculoskeletal:        General: Normal range of motion.     Right lower leg: No edema.  Left lower leg: No edema.     Comments: L hand dorsum edema  Neurological:     Mental Status: He is alert and oriented to person, place, and time.     Motor: Weakness (L hemiparesis) present.  Psychiatric:        Mood and Affect: Mood normal.     CMP Latest Ref Rng & Units 05/11/2020 05/11/2020 05/09/2020  Glucose 70 - 99 mg/dL 169(H) 197(H) 145(H)  BUN 6 - 20 mg/dL 39(H) 43(H) 43(H)  Creatinine 0.61 - 1.24 mg/dL 2.84(H) 2.86(H) 2.99(H)  Sodium 135 - 145 mmol/L 139 138 137  Potassium 3.5 - 5.1 mmol/L 4.1 3.6 3.9  Chloride 98 - 111 mmol/L 105 105 103  CO2 22 - 32 mmol/L 25 23 28   Calcium 8.9 - 10.3 mg/dL 9.1 9.0 9.0  Total Protein 6.5 - 8.1 g/dL - - -  Total Bilirubin 0.3 - 1.2 mg/dL - - -  Alkaline Phos 38 -  126 U/L - - -  AST 15 - 41 U/L - - -  ALT 0 - 44 U/L - - -    Lipid Panel     Component Value Date/Time   CHOL 699 (H) 04/16/2020 0558   CHOL 382 (H) 08/19/2019 1526   TRIG 298 (H) 04/16/2020 0558   HDL 40 (L) 04/16/2020 0558   HDL 116 08/19/2019 1526   CHOLHDL 17.5 04/16/2020 0558   VLDL 60 (H) 04/16/2020 0558   LDLCALC 599 (H) 04/16/2020 0558   LDLCALC 209 (H) 08/19/2019 1526    CBC    Component Value Date/Time   WBC 11.4 (H) 05/10/2020 0549   RBC 3.39 (L) 05/10/2020 0549   HGB 9.1 (L) 05/10/2020 0549   HGB 11.8 (L) 08/19/2019 1526   HCT 28.8 (L) 05/10/2020 0549   HCT 32.5 (L) 04/27/2020 0655   PLT 556 (H) 05/10/2020 0549   PLT 429 08/19/2019 1526   MCV 85.0 05/10/2020 0549   MCV 84 08/19/2019 1526   MCH 26.8 05/10/2020 0549   MCHC 31.6 05/10/2020 0549   RDW 13.9 05/10/2020 0549   RDW 13.2 08/19/2019 1526   LYMPHSABS 2.9 05/10/2020 0549   LYMPHSABS 3.2 (H) 08/19/2019 1526   MONOABS 0.8 05/10/2020 0549   EOSABS 0.5 05/10/2020 0549   EOSABS 0.5 (H) 08/19/2019 1526   BASOSABS 0.1 05/10/2020 0549   BASOSABS 0.1 08/19/2019 1526    Lab Results  Component Value Date   HGBA1C 9.6 (H) 04/16/2020    Assessment & Plan:  1. Hemiparesis affecting left side as late effect of cerebrovascular accident ([place]) Secondary to CVA Continue PT Followed by rehab  2. Hypertension in stage 4 chronic kidney disease due to type 2 diabetes mellitus ([place]) Uncontrolled hypertension Metoprolol substituted with carvedilol Advised mom to schedule appointment with nephrology Counseled on blood pressure goal of less than 130/80, low-sodium, DASH diet, medication compliance, 150 minutes of moderate intensity exercise per week. Discussed medication compliance, adverse effects. - carvedilol (COREG) 12.5 MG tablet; Take 1 tablet (12.5 mg total) by mouth 2 (two) times daily with a meal.  Dispense: 60 tablet; Refill: 3 - hydrALAZINE (APRESOLINE) 25 MG tablet; Take 1 tablet (25 mg total) by  mouth 3 (three) times daily.  Dispense: 90 tablet; Refill: 3 - amLODipine (NORVASC) 10 MG tablet; Take 1 tablet (10 mg total) by mouth daily.  Dispense: 30 tablet; Refill: 3  3. History of CVA (cerebrovascular accident) Risk factor modification  4. Type 2 diabetes mellitus with hyperglycemia, without long-term current use  of insulin ([place]) Uncontrolled with A1c of 9.6 Increase glipizide from 5 mg daily to 5 mg twice daily Counseled on Diabetic diet, my plate method, 349 minutes of moderate intensity exercise/week Blood sugar logs with fasting goals of 80-120 mg/dl, random of less than 180 and in the event of sugars less than 60 mg/dl or greater than 400 mg/dl encouraged to notify the clinic. Advised on the need for annual eye exams, annual foot exams, Pneumonia vaccine. - POCT glucose (manual entry) - glipiZIDE (GLUCOTROL) 5 MG tablet; Take 1 tablet (5 mg total) by mouth 2 (two) times daily before a meal.  Dispense: 60 tablet; Refill: 3 - atorvastatin (LIPITOR) 80 MG tablet; Take 1 tablet (80 mg total) by mouth at bedtime.  Dispense: 30 tablet; Refill: 3  5. GERD with esophagitis Stable - omeprazole (PRILOSEC) 40 MG capsule; Take 1 capsule (40 mg total) by mouth daily.  Dispense: 30 capsule; Refill: 3  6. Need for immunization against influenza - Flu Vaccine QUAD 36+ mos IM   Advised to obtain paperwork for the [place] discount at the front desk.  Meds ordered this encounter  Medications  . carvedilol (COREG) 12.5 MG tablet    Sig: Take 1 tablet (12.5 mg total) by mouth 2 (two) times daily with a meal.    Dispense:  60 tablet    Refill:  3    Discontinue Metoprolol  . glipiZIDE (GLUCOTROL) 5 MG tablet    Sig: Take 1 tablet (5 mg total) by mouth 2 (two) times daily before a meal.    Dispense:  60 tablet    Refill:  3  . omeprazole (PRILOSEC) 40 MG capsule    Sig: Take 1 capsule (40 mg total) by mouth daily.    Dispense:  30 capsule    Refill:  3  . hydrALAZINE  (APRESOLINE) 25 MG tablet    Sig: Take 1 tablet (25 mg total) by mouth 3 (three) times daily.    Dispense:  90 tablet    Refill:  3  . atorvastatin (LIPITOR) 80 MG tablet    Sig: Take 1 tablet (80 mg total) by mouth at bedtime.    Dispense:  30 tablet    Refill:  3  . amLODipine (NORVASC) 10 MG tablet    Sig: Take 1 tablet (10 mg total) by mouth daily.    Dispense:  30 tablet    Refill:  3    Follow-up: Return in about 3 months (around 08/26/2020) for Chronic disease management.       Charlott Rakes, MD, FAAFP. [facility] and [facility], [place]   05/26/2020, 12:07 PM

## 2020-05-26 NOTE — Patient Instructions (Signed)

## 2020-05-26 NOTE — Progress Notes (Signed)
Has swelling in both legs.

## 2020-05-31 ENCOUNTER — Telehealth: Payer: Self-pay | Admitting: *Deleted

## 2020-05-31 NOTE — Telephone Encounter (Signed)
Faxed signed order to d/c PT with [facility]. Confirmation page 3:49 pm.

## 2020-06-06 ENCOUNTER — Ambulatory Visit: Payer: Self-pay

## 2020-06-06 NOTE — Telephone Encounter (Signed)
Patient has not presented today.

## 2020-06-06 NOTE — Telephone Encounter (Signed)
Pt. Reports he started having swelling to both legs beginning of October. "Swelling goes up to my thighs." Can put on shoes and walking around. Pain is 7/10. Denies any shortness of breath or chest pain.Has an appointment 08/29/20. Requesting to be seen sooner.Please advise.  Answer Assessment - Initial Assessment Questions 1. ONSET: "When did the swelling start?" (e.g., minutes, hours, days)     October 2. LOCATION: "What part of the leg is swollen?"  "Are both legs swollen or just one leg?"     Both 3. SEVERITY: "How bad is the swelling?" (e.g., localized; mild, moderate, severe)  - Localized - small area of swelling localized to one leg  - MILD pedal edema - swelling limited to foot and ankle, pitting edema < 1/4 inch (6 mm) deep, rest and elevation eliminate most or all swelling  - MODERATE edema - swelling of lower leg to knee, pitting edema > 1/4 inch (6 mm) deep, rest and elevation only partially reduce swelling  - SEVERE edema - swelling extends above knee, facial or hand swelling present      Thigh 4. REDNESS: "Does the swelling look red or infected?"     No 5. PAIN: "Is the swelling painful to touch?" If Yes, ask: "How painful is it?"   (Scale 1-10; mild, moderate or severe)     7 6. FEVER: "Do you have a fever?" If Yes, ask: "What is it, how was it measured, and when did it start?"      No 7. CAUSE: "What do you think is causing the leg swelling?"     Unsure 8. MEDICAL HISTORY: "Do you have a history of heart failure, kidney disease, liver failure, or cancer?"     Kidney 9. RECURRENT SYMPTOM: "Have you had leg swelling before?" If Yes, ask: "When was the last time?" "What happened that time?"     No 10. OTHER SYMPTOMS: "Do you have any other symptoms?" (e.g., chest pain, difficulty breathing)       No 11. PREGNANCY: "Is there any chance you are pregnant?" "When was your last menstrual period?"       n/a  Protocols used: LEG SWELLING AND EDEMA-A-AH

## 2020-06-07 ENCOUNTER — Ambulatory Visit: Payer: Self-pay | Admitting: Physician Assistant

## 2020-06-07 VITALS — BP 155/82 | HR 89 | Resp 18 | Ht 67.0 in

## 2020-06-07 DIAGNOSIS — R600 Localized edema: Secondary | ICD-10-CM

## 2020-06-07 DIAGNOSIS — Z862 Personal history of diseases of the blood and blood-forming organs and certain disorders involving the immune mechanism: Secondary | ICD-10-CM

## 2020-06-07 DIAGNOSIS — E559 Vitamin D deficiency, unspecified: Secondary | ICD-10-CM

## 2020-06-07 DIAGNOSIS — I6300 Cerebral infarction due to thrombosis of unspecified precerebral artery: Secondary | ICD-10-CM

## 2020-06-07 MED ORDER — VITAMIN D (ERGOCALCIFEROL) 1.25 MG (50000 UNIT) PO CAPS: 50000.0000 [IU] | ORAL_CAPSULE | ORAL | 2 refills | Status: DC

## 2020-06-07 NOTE — Progress Notes (Signed)
Established Patient Office Visit  Subjective:  Patient ID: Tony Schmitt, male    DOB: 1971-03-29  Age: 49 y.o. MRN: [id number]  CC:  Chief Complaint  Patient presents with   Edema    HPI Tony Schmitt reports left arm, hand sweeling and bilateral leg swelling.  States that he has been taking his medications as directed, has been attending PT.  Hospital Course from recent CVA:    Diet: Diabetic diet  Special Instructions: No driving smoking or alcohol  Follow-up with primary care physician in regards to monitoring of blood pressure and diabetes  Patient should have follow-up chemistries within 1 week to monitor renal function.  Medications at discharge 1.  Tylenol as needed 2.  Norvasc 10 mg p.o. daily 3.  Aspirin 81 mg p.o. daily 4.  Lipitor 80 mg p.o. daily 5.  Glucotrol 5 mg twice daily 6.  Lopressor 100 mg p.o. twice daily 7.  Protonix 40 mg p.o. daily 8.  MiraLAX twice daily hold for loose stools 9.  Sodium bicarbonate 1300 mg p.o. twice daily  30-35 minutes were spent completing discharge summary and discharge planning    Hospital Course: Tony Schmitt was admitted to rehab 04/22/2020 for inpatient therapies to consist of PT, ST and OT at least three hours five days a week. Past admission physiatrist, therapy team and rehab RN have worked together to provide customized collaborative inpatient rehab.  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  Patient would follow-up neurology services.  Lovenox for DVT prophylaxis later discontinued patient ambulating greater than 150 feet.  Blood pressure controlled on Norvasc as well as Lopressor.  Lisinopril is prior to admission held due to AKI.  Patient would need follow-up outpatient monitoring of blood pressure.  Blood sugars controlled monitored hemoglobin A1c 9.6 patient  had been on low-dose Lantus insulin while in the hospital on oral agent Glucotrol prior to admission.  Patient was noted to be medically noncompliant in regards to his diabetes mellitus and did not want a use insulin at home at this time requesting to resume Glucotrol which was started back at 5 mg twice daily at discharge.  His Glucophage remained on hold due to AKI.  Patient would need follow-up with PCP.Marland Kitchen  AKI on CKD creatinine baseline 2.42 as of 08/02/2018 renal ultrasound 04/17/2019 showed no hydronephrosis.  Creatinine stabilizing 2.86 he was recently on IV fluid x3 nights completed and would follow-up outpatient nephrology.  Patient did have a history of tobacco abuse receiving counsel regarding cessation of nicotine products..     Past Medical History:  Diagnosis Date   Diabetes mellitus without complication ([place])    Hypertension     Past Surgical History:  Procedure Laterality Date   BUBBLE STUDY  04/18/2020   Procedure: BUBBLE STUDY;  Surgeon: Sueanne Margarita, MD;  Location: [facility] ENDOSCOPY;  Service: Cardiovascular;;   TEE WITHOUT CARDIOVERSION N/A 04/18/2020   Procedure: TRANSESOPHAGEAL ECHOCARDIOGRAM (TEE);  Surgeon: Sueanne Margarita, MD;  Location: [facility] ENDOSCOPY;  Service: Cardiovascular;  Laterality: N/A;    Family History  Problem Relation Age of Onset   Diabetes Mother    Diabetes Father    Hypertension Father    Cancer Father    Heart failure Other     Social History   Socioeconomic History   Marital status: Single    Spouse name: Not on file  Number of children: Not on file   Years of education: Not on file   Highest education level: Not on file  Occupational History   Not on file  Tobacco Use   Smoking status: Former Smoker    Types: Cigarettes, Cigars   Smokeless tobacco: Never Used  Vaping Use   Vaping Use: Never used  Substance and Sexual Activity   Alcohol use: Not Currently   Drug use: Never   Sexual activity: Not on file  Other  Topics Concern   Not on file  Social History Narrative   Not on file   Social Determinants of Health   Financial Resource Strain:    Difficulty of Paying Living Expenses: Not on file  Food Insecurity:    Worried About Tony Schmitt in the Last Year: Not on file   [place] in the Last Year: Not on file  Transportation Needs:    Lack of Transportation (Medical): Not on file   Lack of Transportation (Non-Medical): Not on file  Physical Activity:    Days of Exercise per Week: Not on file   Minutes of Exercise per Session: Not on file  Stress:    Feeling of Stress : Not on file  Social Connections:    Frequency of Communication with Friends and Family: Not on file   Frequency of Social Gatherings with Friends and Family: Not on file   Attends Religious Services: Not on file   Active Member of Clubs or Organizations: Not on file   Attends Archivist Meetings: Not on file   Marital Status: Not on file  Intimate Partner Violence:    Fear of Current or Ex-Partner: Not on file   Emotionally Abused: Not on file   Physically Abused: Not on file   Sexually Abused: Not on file    Outpatient Medications Prior to Visit  Medication Sig Dispense Refill   acetaminophen (TYLENOL) 325 MG tablet Take 2 tablets (650 mg total) by mouth every 4 (four) hours as needed for mild pain (or temp > 37.5 C (99.5 F)).     amLODipine (NORVASC) 10 MG tablet Take 1 tablet (10 mg total) by mouth daily. 30 tablet 3   aspirin 81 MG chewable tablet Chew 1 tablet (81 mg total) by mouth daily. 30 tablet 0   atorvastatin (LIPITOR) 80 MG tablet Take 1 tablet (80 mg total) by mouth at bedtime. 30 tablet 3   carvedilol (COREG) 12.5 MG tablet Take 1 tablet (12.5 mg total) by mouth 2 (two) times daily with a meal. 60 tablet 3   glipiZIDE (GLUCOTROL) 5 MG tablet Take 1 tablet (5 mg total) by mouth 2 (two) times daily before a meal. 60 tablet 3   hydrALAZINE (APRESOLINE) 25  MG tablet Take 1 tablet (25 mg total) by mouth 3 (three) times daily. 90 tablet 3   omeprazole (PRILOSEC) 40 MG capsule Take 1 capsule (40 mg total) by mouth daily. 30 capsule 3   polyethylene glycol (MIRALAX / GLYCOLAX) 17 g packet Take 17 g by mouth 2 (two) times daily. 14 each 0   senna-docusate (SENOKOT-S) 8.6-50 MG tablet Take 2 tablets by mouth at bedtime.     sodium bicarbonate 650 MG tablet Take 2 tablets (1,300 mg total) by mouth 2 (two) times daily. 120 tablet 0   No facility-administered medications prior to visit.    No Known Allergies  ROS Review of Systems  Constitutional: Negative.   HENT: Negative.   Eyes:  Negative.   Respiratory: Negative for shortness of breath and wheezing.   Cardiovascular: Negative for chest pain and palpitations.  Gastrointestinal: Negative.   Endocrine: Negative.   Genitourinary: Negative.   Musculoskeletal: Positive for gait problem.  Skin: Negative.   Allergic/Immunologic: Negative.   Neurological: Positive for weakness. Negative for dizziness, speech difficulty and headaches.  Hematological: Negative.   Psychiatric/Behavioral: Negative for self-injury and suicidal ideas.      Objective:    Physical Exam Vitals and nursing note reviewed.  Constitutional:      General: He is not in acute distress.    Appearance: Normal appearance. He is not ill-appearing.  HENT:     Head: Normocephalic and atraumatic.     Right Ear: External ear normal.     Left Ear: External ear normal.     Nose: Nose normal.     Mouth/Throat:     Mouth: Mucous membranes are moist.     Pharynx: Oropharynx is clear.  Eyes:     Extraocular Movements: Extraocular movements intact.     Conjunctiva/sclera: Conjunctivae normal.     Pupils: Pupils are equal, round, and reactive to light.  Cardiovascular:     Rate and Rhythm: Normal rate and regular rhythm.     Pulses: Normal pulses.          Dorsalis pedis pulses are 2+ on the right side and 2+ on the left  side.       Posterior tibial pulses are 2+ on the right side and 2+ on the left side.     Heart sounds: Normal heart sounds.  Pulmonary:     Effort: Pulmonary effort is normal.     Breath sounds: Normal breath sounds.  Abdominal:     General: Abdomen is flat.     Palpations: Abdomen is soft.  Musculoskeletal:     Right hand: Normal.     Left hand: Swelling present. Decreased range of motion. Decreased strength. Decreased sensation.     Cervical back: Normal range of motion and neck supple.     Right lower leg: 1+ Edema present.     Left lower leg: 1+ Edema present.  Skin:    General: Skin is warm and dry.  Neurological:     General: No focal deficit present.     Mental Status: He is alert and oriented to person, place, and time.  Psychiatric:        Mood and Affect: Mood normal.        Behavior: Behavior normal.        Thought Content: Thought content normal.        Judgment: Judgment normal.     BP (!) 155/82 (BP Location: Right Arm, Patient Position: Sitting, Cuff Size: Normal)    Pulse 89    Resp 18    Ht 5\' 7"  (1.702 m)    SpO2 96%    BMI 27.60 kg/m  Wt Readings from Last 3 Encounters:  05/26/20 176 lb 3.2 oz (79.9 kg)  05/23/20 170 lb (77.1 kg)  05/23/20 150 lb (68 kg)     Health Maintenance Due  Topic Date Due   Hepatitis C Screening  Never done   PNEUMOCOCCAL POLYSACCHARIDE VACCINE AGE 40-64 HIGH RISK  Never done   COVID-19 Vaccine (1) Never done   COLONOSCOPY  Never done   FOOT EXAM  04/15/2020    There are no preventive care reminders to display for this patient.  Lab Results  Component Value Date  TSH 2.890 06/07/2020   Lab Results  Component Value Date   WBC 10.4 06/07/2020   HGB 9.8 (L) 06/07/2020   HCT 30.3 (L) 06/07/2020   MCV 84 06/07/2020   PLT 471 (H) 06/07/2020   Lab Results  Component Value Date   NA 142 06/07/2020   K 4.1 06/07/2020   CO2 25 05/11/2020   GLUCOSE 116 (H) 06/07/2020   BUN 37 (H) 06/07/2020   CREATININE 3.44 (H)  06/07/2020   BILITOT <0.2 06/07/2020   ALKPHOS 183 (H) 06/07/2020   AST 34 06/07/2020   ALT 34 04/25/2020   PROT 5.6 (L) 06/07/2020   ALBUMIN 2.9 (L) 06/07/2020   CALCIUM 9.3 06/07/2020   ANIONGAP 9 05/11/2020   Lab Results  Component Value Date   CHOL 699 (H) 04/16/2020   Lab Results  Component Value Date   HDL 40 (L) 04/16/2020   Lab Results  Component Value Date   LDLCALC 599 (H) 04/16/2020   Lab Results  Component Value Date   TRIG 298 (H) 04/16/2020   Lab Results  Component Value Date   CHOLHDL 17.5 04/16/2020   Lab Results  Component Value Date   HGBA1C 9.6 (H) 04/16/2020      Assessment & Plan:   Problem List Items Addressed This Visit      Cardiovascular and Mediastinum   CVA (cerebral vascular accident) ([place])   Relevant Orders   Comp. Metabolic Panel (12) (Completed)   TSH (Completed)     Genitourinary   AKI (acute kidney injury) ([place])    Other Visit Diagnoses    Vitamin D deficiency    -  Primary   Relevant Medications   Vitamin D, Ergocalciferol, (DRISDOL) 1.25 MG (50000 UNIT) CAPS capsule   Localized edema       Relevant Orders   Comp. Metabolic Panel (12) (Completed)   TSH (Completed)      Meds ordered this encounter  Medications   Vitamin D, Ergocalciferol, (DRISDOL) 1.25 MG (50000 UNIT) CAPS capsule    Sig: Take 1 capsule (50,000 Units total) by mouth every 7 (seven) days.    Dispense:  4 capsule    Refill:  2    Order Specific Question:   Supervising Provider    Answer:   WRIGHT, PATRICK E [[id number]]   1. Vitamin D deficiency This note was closed after lab results returned and patient renal function is worsening.  Have been unable to reach patient, but will have him hold the Vit D until renal function is improved - Vitamin D, Ergocalciferol, (DRISDOL) 1.25 MG (50000 UNIT) CAPS capsule; Take 1 capsule (50,000 Units total) by mouth every 7 (seven) days.  Dispense: 4 capsule; Refill: 2  2. History of anemia  - CBC with  Differential/Platelet - Iron, TIBC and Ferritin Panel  3. Cerebrovascular accident (CVA) due to thrombosis of precerebral artery ([place])  - Comp. Metabolic Panel (12) - TSH  4. Localized edema  - Comp. Metabolic Panel (12) - TSH  Labs returned prior to note being closed, patient needs to be seen in the ED for fluid due to worsening kidney function  Will continue to try and reach patient and have him follow up in the mobile next week   I have reviewed the patient's medical history (PMH, PSH, Social History, Family History, Medications, and allergies) , and have been updated if relevant. I spent 40 minutes reviewing chart and  face to face time with patient.     Follow-up: Return in about  2 weeks (around 06/21/2020).    Loraine Grip Mayers, PA-C

## 2020-06-07 NOTE — Patient Instructions (Signed)
Please work on keeping your hand and legs elevated.  Please mention to your PT tomorrow regading the hand swelling.  You need to start Vit D once a week for the next 12 weeks.  I sent the script to your pharmacy  We will call you with your lab results.  Please let us know if there is anything else we can do for you.  Kennieth Rad, PA-C Physician Assistant [facility] [email or web address]    Vitamin D Deficiency Vitamin D deficiency is when your body does not have enough vitamin D. Vitamin D is important to your body for many reasons:  It helps the body absorb two important minerals--calcium and phosphorus.  It plays a role in bone health.  It may help to prevent some diseases, such as diabetes and multiple sclerosis.  It plays a role in muscle function, including heart function. If vitamin D deficiency is severe, it can cause a condition in which your bones become soft. In adults, this condition is called osteomalacia. In children, this condition is called rickets. What are the causes? This condition may be caused by:  Not eating enough foods that contain vitamin D.  Not getting enough natural sun exposure.  Having certain digestive system diseases that make it difficult for your body to absorb vitamin D. These diseases include Crohn's disease, chronic pancreatitis, and cystic fibrosis.  Having a surgery in which a part of the stomach or a part of the small intestine is removed.  Having chronic kidney disease or liver disease. What increases the risk? You are more likely to develop this condition if you:  Are older.  Do not spend much time outdoors.  Live in a long-term care facility.  Have had broken bones.  Have weak or thin bones (osteoporosis).  Have a disease or condition that changes how the body absorbs vitamin D.  Have dark skin.  Take certain medicines, such as steroid medicines or certain seizure  medicines.  Are overweight or obese. What are the signs or symptoms? In mild cases of vitamin D deficiency, there may not be any symptoms. If the condition is severe, symptoms may include:  Bone pain.  Muscle pain.  Falling often.  Broken bones caused by a minor injury. How is this diagnosed? This condition may be diagnosed with blood tests. Imaging tests such as X-rays may also be done to look for changes in the bone. How is this treated? Treatment for this condition may depend on what caused the condition. Treatment options include:  Taking vitamin D supplements. Your health care provider will suggest what dose is best for you.  Taking a calcium supplement. Your health care provider will suggest what dose is best for you. Follow these instructions at home: Eating and drinking   Eat foods that contain vitamin D. Choices include: ? Fortified dairy products, cereals, or juices. Fortified means that vitamin D has been added to the food. Check the label on the package to see if the food is fortified. ? Fatty fish, such as salmon or trout. ? Eggs. ? Oysters. ? Mushrooms. The items listed above may not be a complete list of recommended foods and beverages. Contact a dietitian for more information. General instructions  Take medicines and supplements only as told by your health care provider.  Get regular, safe exposure to natural sunlight.  Do not use a tanning bed.  Maintain a healthy weight. Lose weight if needed.  Keep all follow-up visits as told by your health  care provider. This is important. How is this prevented? You can get vitamin D by:  Eating foods that naturally contain vitamin D.  Eating or drinking products that have been fortified with vitamin D, such as cereals, juices, and dairy products (including milk).  Taking a vitamin D supplement or a multivitamin supplement that contains vitamin D.  Being in the sun. Your body naturally makes vitamin D when your  skin is exposed to sunlight. Your body changes the sunlight into a form of the vitamin that it can use. Contact a health care provider if:  Your symptoms do not go away.  You feel nauseous or you vomit.  You have fewer bowel movements than usual or are constipated. Summary  Vitamin D deficiency is when your body does not have enough vitamin D.  Vitamin D is important to your body for good bone health and muscle function, and it may help prevent some diseases.  Vitamin D deficiency is primarily treated through supplementation. Your health care provider will suggest what dose is best for you.  You can get vitamin D by eating foods that contain vitamin D, by being in the sun, and by taking a vitamin D supplement or a multivitamin supplement that contains vitamin D. This information is not intended to replace advice given to you by your health care provider. Make sure you discuss any questions you have with your health care provider. Document Revised: 03/31/2018 Document Reviewed: 03/31/2018 Elsevier Patient Education  [place].

## 2020-06-07 NOTE — Progress Notes (Signed)
Patient presents with bilateral leg swelling and left arm swelling. Patient reports swelling since leaving the hospital and swelling increasing since HFU. Patient denies pain at this time. Patient has taken medication and patient has eaten today.

## 2020-06-08 LAB — COMP. METABOLIC PANEL (12)
AST: 34 IU/L (ref 0–40)
Albumin/Globulin Ratio: 1.1 — ABNORMAL LOW (ref 1.2–2.2)
Albumin: 2.9 g/dL — ABNORMAL LOW (ref 4.0–5.0)
Alkaline Phosphatase: 183 IU/L — ABNORMAL HIGH (ref 44–121)
BUN/Creatinine Ratio: 11 (ref 9–20)
BUN: 37 mg/dL — ABNORMAL HIGH (ref 6–24)
Bilirubin Total: <0.2 mg/dL (ref 0.0–1.2)
Calcium: 9.3 mg/dL (ref 8.7–10.2)
Chloride: 107 mmol/L — ABNORMAL HIGH (ref 96–106)
Creatinine, Ser: 3.44 mg/dL — ABNORMAL HIGH (ref 0.76–1.27)
GFR calc Af Amer: 23 mL/min/{1.73_m2} — ABNORMAL LOW (ref >59)
GFR calc non Af Amer: 20 mL/min/{1.73_m2} — ABNORMAL LOW (ref >59)
Globulin, Total: 2.7 g/dL (ref 1.5–4.5)
Glucose: 116 mg/dL — ABNORMAL HIGH (ref 65–99)
Potassium: 4.1 mmol/L (ref 3.5–5.2)
Sodium: 142 mmol/L (ref 134–144)
Total Protein: 5.6 g/dL — ABNORMAL LOW (ref 6.0–8.5)

## 2020-06-08 LAB — CBC WITH DIFFERENTIAL/PLATELET
Basophils Absolute: 0.1 10*3/uL (ref 0.0–0.2)
Basos: 1 %
EOS (ABSOLUTE): 0.2 10*3/uL (ref 0.0–0.4)
Eos: 2 %
Hematocrit: 30.3 % — ABNORMAL LOW (ref 37.5–51.0)
Hemoglobin: 9.8 g/dL — ABNORMAL LOW (ref 13.0–17.7)
Immature Grans (Abs): 0.1 10*3/uL (ref 0.0–0.1)
Immature Granulocytes: 1 %
Lymphocytes Absolute: 1.8 10*3/uL (ref 0.7–3.1)
Lymphs: 18 %
MCH: 27.3 pg (ref 26.6–33.0)
MCHC: 32.3 g/dL (ref 31.5–35.7)
MCV: 84 fL (ref 79–97)
Monocytes Absolute: 0.7 10*3/uL (ref 0.1–0.9)
Monocytes: 7 %
Neutrophils Absolute: 7.4 10*3/uL — ABNORMAL HIGH (ref 1.4–7.0)
Neutrophils: 71 %
Platelets: 471 10*3/uL — ABNORMAL HIGH (ref 150–450)
RBC: 3.59 x10E6/uL — ABNORMAL LOW (ref 4.14–5.80)
RDW: 14.1 % (ref 11.6–15.4)
WBC: 10.4 10*3/uL (ref 3.4–10.8)

## 2020-06-08 LAB — IRON,TIBC AND FERRITIN PANEL
Ferritin: 1853 ng/mL — ABNORMAL HIGH (ref 30–400)
Iron Saturation: 21 % (ref 15–55)
Iron: 40 ug/dL (ref 38–169)
Total Iron Binding Capacity: 189 ug/dL — ABNORMAL LOW (ref 250–450)
UIBC: 149 ug/dL (ref 111–343)

## 2020-06-08 LAB — TSH: TSH: 2.89 u[IU]/mL (ref 0.450–4.500)

## 2020-06-09 ENCOUNTER — Telehealth: Payer: Self-pay | Admitting: *Deleted

## 2020-06-09 ENCOUNTER — Encounter: Payer: Self-pay | Admitting: Physician Assistant

## 2020-06-09 NOTE — Telephone Encounter (Signed)
-----   Message from Tony Schmitt, [place] sent at 06/08/2020  4:25 PM EDT ----- Please call patient and request virtual appt to discuss lab results asap

## 2020-06-09 NOTE — Telephone Encounter (Signed)
MA has attempted to reach patient for 2 days with no response to the recent changes in lab results. Patient also received a mychart message from the provider expressing the importance of patient returning a phone call and being scheduled for a tele visit to discuss said results. Voicemail states to give a call back to [place] with [facility] at [phone].

## 2020-06-10 ENCOUNTER — Other Ambulatory Visit: Payer: Self-pay

## 2020-06-10 ENCOUNTER — Inpatient Hospital Stay (HOSPITAL_COMMUNITY)
Admission: EM | Admit: 2020-06-10 | Discharge: 2020-06-14 | DRG: 683 | Disposition: A | Discharge status: Home / Self Care | Payer: Self-pay | Attending: Family Medicine | Admitting: Family Medicine

## 2020-06-10 ENCOUNTER — Other Ambulatory Visit: Payer: Self-pay | Admitting: Family Medicine

## 2020-06-10 ENCOUNTER — Emergency Department (HOSPITAL_BASED_OUTPATIENT_CLINIC_OR_DEPARTMENT_OTHER): Payer: Self-pay

## 2020-06-10 ENCOUNTER — Encounter (HOSPITAL_COMMUNITY): Payer: Self-pay | Admitting: Emergency Medicine

## 2020-06-10 ENCOUNTER — Emergency Department (HOSPITAL_COMMUNITY): Payer: Self-pay

## 2020-06-10 DIAGNOSIS — Z809 Family history of malignant neoplasm, unspecified: Secondary | ICD-10-CM

## 2020-06-10 DIAGNOSIS — E785 Hyperlipidemia, unspecified: Secondary | ICD-10-CM | POA: Diagnosis present

## 2020-06-10 DIAGNOSIS — E1165 Type 2 diabetes mellitus with hyperglycemia: Secondary | ICD-10-CM | POA: Diagnosis present

## 2020-06-10 DIAGNOSIS — Z7984 Long term (current) use of oral hypoglycemic drugs: Secondary | ICD-10-CM

## 2020-06-10 DIAGNOSIS — K5900 Constipation, unspecified: Secondary | ICD-10-CM | POA: Diagnosis present

## 2020-06-10 DIAGNOSIS — E8770 Fluid overload, unspecified: Secondary | ICD-10-CM | POA: Diagnosis present

## 2020-06-10 DIAGNOSIS — Z8249 Family history of ischemic heart disease and other diseases of the circulatory system: Secondary | ICD-10-CM

## 2020-06-10 DIAGNOSIS — J90 Pleural effusion, not elsewhere classified: Secondary | ICD-10-CM | POA: Diagnosis present

## 2020-06-10 DIAGNOSIS — D631 Anemia in chronic kidney disease: Secondary | ICD-10-CM | POA: Diagnosis present

## 2020-06-10 DIAGNOSIS — R600 Localized edema: Secondary | ICD-10-CM

## 2020-06-10 DIAGNOSIS — Z7982 Long term (current) use of aspirin: Secondary | ICD-10-CM

## 2020-06-10 DIAGNOSIS — R609 Edema, unspecified: Secondary | ICD-10-CM

## 2020-06-10 DIAGNOSIS — J811 Chronic pulmonary edema: Secondary | ICD-10-CM | POA: Diagnosis present

## 2020-06-10 DIAGNOSIS — E1122 Type 2 diabetes mellitus with diabetic chronic kidney disease: Secondary | ICD-10-CM | POA: Diagnosis present

## 2020-06-10 DIAGNOSIS — E1169 Type 2 diabetes mellitus with other specified complication: Secondary | ICD-10-CM | POA: Diagnosis present

## 2020-06-10 DIAGNOSIS — E8809 Other disorders of plasma-protein metabolism, not elsewhere classified: Secondary | ICD-10-CM | POA: Diagnosis present

## 2020-06-10 DIAGNOSIS — I129 Hypertensive chronic kidney disease with stage 1 through stage 4 chronic kidney disease, or unspecified chronic kidney disease: Secondary | ICD-10-CM | POA: Diagnosis present

## 2020-06-10 DIAGNOSIS — G8194 Hemiplegia, unspecified affecting left nondominant side: Secondary | ICD-10-CM | POA: Diagnosis present

## 2020-06-10 DIAGNOSIS — N184 Chronic kidney disease, stage 4 (severe): Secondary | ICD-10-CM | POA: Diagnosis present

## 2020-06-10 DIAGNOSIS — Z993 Dependence on wheelchair: Secondary | ICD-10-CM

## 2020-06-10 DIAGNOSIS — I6930 Unspecified sequelae of cerebral infarction: Secondary | ICD-10-CM

## 2020-06-10 DIAGNOSIS — Z833 Family history of diabetes mellitus: Secondary | ICD-10-CM

## 2020-06-10 DIAGNOSIS — Z87891 Personal history of nicotine dependence: Secondary | ICD-10-CM

## 2020-06-10 DIAGNOSIS — R809 Proteinuria, unspecified: Secondary | ICD-10-CM | POA: Diagnosis present

## 2020-06-10 DIAGNOSIS — Z20822 Contact with and (suspected) exposure to covid-19: Secondary | ICD-10-CM | POA: Diagnosis present

## 2020-06-10 DIAGNOSIS — M7989 Other specified soft tissue disorders: Secondary | ICD-10-CM

## 2020-06-10 DIAGNOSIS — D75838 Other thrombocytosis: Secondary | ICD-10-CM | POA: Diagnosis present

## 2020-06-10 DIAGNOSIS — Z79899 Other long term (current) drug therapy: Secondary | ICD-10-CM

## 2020-06-10 DIAGNOSIS — K219 Gastro-esophageal reflux disease without esophagitis: Secondary | ICD-10-CM | POA: Diagnosis present

## 2020-06-10 DIAGNOSIS — R531 Weakness: Secondary | ICD-10-CM | POA: Diagnosis present

## 2020-06-10 DIAGNOSIS — N049 Nephrotic syndrome with unspecified morphologic changes: Secondary | ICD-10-CM

## 2020-06-10 DIAGNOSIS — N179 Acute kidney failure, unspecified: Principal | ICD-10-CM | POA: Diagnosis present

## 2020-06-10 LAB — BASIC METABOLIC PANEL
Anion gap: 10 (ref 5–15)
BUN: 31 mg/dL — ABNORMAL HIGH (ref 6–20)
CO2: 23 mmol/L (ref 22–32)
Calcium: 9 mg/dL (ref 8.9–10.3)
Chloride: 110 mmol/L (ref 98–111)
Creatinine, Ser: 3.42 mg/dL — ABNORMAL HIGH (ref 0.61–1.24)
GFR, Estimated: 21 mL/min — ABNORMAL LOW (ref >60)
Glucose, Bld: 179 mg/dL — ABNORMAL HIGH (ref 70–99)
Potassium: 3.9 mmol/L (ref 3.5–5.1)
Sodium: 143 mmol/L (ref 135–145)

## 2020-06-10 LAB — URINALYSIS, ROUTINE W REFLEX MICROSCOPIC
Bacteria, UA: NONE SEEN
Bilirubin Urine: NEGATIVE
Glucose, UA: 150 mg/dL — AB
Hgb urine dipstick: NEGATIVE
Ketones, ur: NEGATIVE mg/dL
Leukocytes,Ua: NEGATIVE
Nitrite: NEGATIVE
Protein, ur: >=300 mg/dL — AB
Specific Gravity, Urine: 1.017 (ref 1.005–1.030)
pH: 6 (ref 5.0–8.0)

## 2020-06-10 LAB — GLUCOSE, CAPILLARY: Glucose-Capillary: 161 mg/dL — ABNORMAL HIGH (ref 70–99)

## 2020-06-10 LAB — RESPIRATORY PANEL BY RT PCR (FLU A&B, COVID)
Influenza A by PCR: NEGATIVE
Influenza B by PCR: NEGATIVE
SARS Coronavirus 2 by RT PCR: NEGATIVE

## 2020-06-10 LAB — HEPATIC FUNCTION PANEL
ALT: 39 U/L (ref 0–44)
AST: 33 U/L (ref 15–41)
Albumin: 2 g/dL — ABNORMAL LOW (ref 3.5–5.0)
Alkaline Phosphatase: 136 U/L — ABNORMAL HIGH (ref 38–126)
Bilirubin, Direct: <0.1 mg/dL (ref 0.0–0.2)
Total Bilirubin: 0.4 mg/dL (ref 0.3–1.2)
Total Protein: 5.6 g/dL — ABNORMAL LOW (ref 6.5–8.1)

## 2020-06-10 LAB — CBC
HCT: 31.1 % — ABNORMAL LOW (ref 39.0–52.0)
Hemoglobin: 9.7 g/dL — ABNORMAL LOW (ref 13.0–17.0)
MCH: 27.2 pg (ref 26.0–34.0)
MCHC: 31.2 g/dL (ref 30.0–36.0)
MCV: 87.1 fL (ref 80.0–100.0)
Platelets: 469 10*3/uL — ABNORMAL HIGH (ref 150–400)
RBC: 3.57 MIL/uL — ABNORMAL LOW (ref 4.22–5.81)
RDW: 15.1 % (ref 11.5–15.5)
WBC: 10.3 10*3/uL (ref 4.0–10.5)
nRBC: 0 % (ref 0.0–0.2)

## 2020-06-10 LAB — BRAIN NATRIURETIC PEPTIDE: B Natriuretic Peptide: 75.5 pg/mL (ref 0.0–100.0)

## 2020-06-10 MED ORDER — PANTOPRAZOLE SODIUM 40 MG PO TBEC
40.0000 mg | DELAYED_RELEASE_TABLET | Freq: Every day | ORAL | Status: DC
  Administered 2020-06-11 – 2020-06-14 (×4): 40 mg via ORAL
  Filled 2020-06-10 (×5): qty 1

## 2020-06-10 MED ORDER — ATORVASTATIN CALCIUM 10 MG PO TABS
80.0000 mg | ORAL_TABLET | Freq: Every day | ORAL | Status: DC
  Administered 2020-06-10 – 2020-06-13 (×4): 80 mg via ORAL
  Filled 2020-06-10 (×4): qty 8

## 2020-06-10 MED ORDER — ENOXAPARIN SODIUM 30 MG/0.3ML ~~LOC~~ SOLN
30.0000 mg | SUBCUTANEOUS | Status: DC
  Administered 2020-06-10 – 2020-06-13 (×4): 30 mg via SUBCUTANEOUS
  Filled 2020-06-10 (×4): qty 0.3

## 2020-06-10 MED ORDER — ACETAMINOPHEN 325 MG PO TABS: 650.0000 mg | ORAL_TABLET | ORAL | Status: DC | PRN

## 2020-06-10 MED ORDER — INSULIN ASPART 100 UNIT/ML ~~LOC~~ SOLN
0.0000 [IU] | Freq: Three times a day (TID) | SUBCUTANEOUS | Status: DC
  Administered 2020-06-11: 2 [IU] via SUBCUTANEOUS
  Administered 2020-06-12: 1 [IU] via SUBCUTANEOUS
  Administered 2020-06-12: 2 [IU] via SUBCUTANEOUS
  Administered 2020-06-12 – 2020-06-13 (×3): 1 [IU] via SUBCUTANEOUS
  Administered 2020-06-13: 2 [IU] via SUBCUTANEOUS
  Administered 2020-06-14: 1 [IU] via SUBCUTANEOUS

## 2020-06-10 MED ORDER — HYDRALAZINE HCL 25 MG PO TABS
25.0000 mg | ORAL_TABLET | Freq: Three times a day (TID) | ORAL | Status: DC
  Administered 2020-06-10 – 2020-06-14 (×11): 25 mg via ORAL
  Filled 2020-06-10 (×11): qty 1

## 2020-06-10 MED ORDER — CARVEDILOL 3.125 MG PO TABS
12.5000 mg | ORAL_TABLET | Freq: Two times a day (BID) | ORAL | Status: DC
  Administered 2020-06-11 – 2020-06-14 (×7): 12.5 mg via ORAL
  Filled 2020-06-10 (×8): qty 4

## 2020-06-10 MED ORDER — FUROSEMIDE 10 MG/ML IJ SOLN
20.0000 mg | Freq: Once | INTRAMUSCULAR | Status: AC
  Administered 2020-06-10: 20 mg via INTRAVENOUS
  Filled 2020-06-10: qty 2

## 2020-06-10 MED ORDER — AMLODIPINE BESYLATE 5 MG PO TABS
10.0000 mg | ORAL_TABLET | Freq: Every day | ORAL | Status: DC
  Administered 2020-06-11 – 2020-06-14 (×4): 10 mg via ORAL
  Filled 2020-06-10 (×4): qty 2

## 2020-06-10 MED ORDER — ASPIRIN 81 MG PO CHEW
81.0000 mg | CHEWABLE_TABLET | Freq: Every day | ORAL | Status: DC
  Administered 2020-06-11 – 2020-06-14 (×4): 81 mg via ORAL
  Filled 2020-06-10 (×4): qty 1

## 2020-06-10 NOTE — H&P (Addendum)
[facility] Admission History and Physical Service Pager: [phone]  Patient name: Tony Schmitt Medical record number: [id number] Date of birth: 05-28-1971 Age: 49 y.o. Gender: male  Primary Care Provider: Charlott Rakes, MD Consultants: none Code Status: Full Preferred Emergency Contact: Kristjan Derner (mother) [phone]  Chief Complaint: bilateral LE edema   Assessment and Plan: Tony Schmitt is a 49 y.o. male presenting with worsening bilateral lower extremity swelling . PMH is significant for Type 2 DM, HTN and recent CVA.   Bilateral LE edema Presents with lower extremity edema bilaterally for about a month that has been progressively worse. No prior history of CHF. BNP 75.5 wnl. CXR notable for cardiomegaly with bilateral interstitial prominence and small bilateral pleural effusions. Findings suggestive of CHF. Most recent echo demonstrated EF 60-65% which overall remains unchanged from previous echo performed a year ago. Not given any diuretics in the ED. Low suspicion for DVT given no history of DVT, no calf tenderness on exam and Wells score of 1.5. Infectious etiology less likely due to normal WBC and CXR lacking consistent findings.  Edema could also be caused by nephrotic syndrome.  Patient had elevated lipid panel with LDL of 599 at previous check which was April 16, 2020.  Patient has hypoalbuminemia as well as greater than 300 protein in his urine.  Albumin decreased at 2.0.  Thrombocytosis with platelets at 499.  There is also concern for CHF given imaging results.  Recent echocardiogram as well as BNP on admission I heart failure less likely.  On exam, diffuse crackles noted along with LE edema bilaterally. -Admit to [facility]MTS, attending Dr. Ardelia Mems  -Consider nephrology consult -Sodium restriction of less than 2 g/day -am BMP -daily weights -strict I/Os -IV lasix 20 mg, if nephrotic syndrome is ultimately the cause of the edema patient will  likely need a higher dose than 20 mg -Consider repeat echocardiogram given symptoms started after his previous echo was completed -PT/OT  -monitor clinically   CKD stage 4 Patient admitted with Cr 3.42.  Previously recorded baseline creatinine between 2 and 3.  Upon chart review it appears that is closer to 3-3 0.1. GFR 21 and BUN 31.  Edema and fluid overload could be related to nephrotic syndrome presentation.  This could also be cardiorenal in etiology with worsening of his kidney function.  -UA ordered -Gentle diuresis -Morning BMP -Consider nephrology consult -Strict I's and O's  Normocytic anemia  Thrombocytosis  Admitted with platelet count of 469, likely seems to be chronic.  This is common in patients with nephrotic syndrome.  Hgb on admission 9.7, likely due to anemia of chronic disease.  -am CBC -continue to monitor   Type 2 DM Blood glucose 179 on admission. Reports compliance of medications and tolerating them well. Denies any hypoglycemic episodes. Home medications include glipizide 5 mg bid.  -sSSI -monitor CBGs  HTN Hypertensive on admission with BP 165/86. Home meds include amlodipine 10 mg daily, carvedilol 12.5 mg and hydralazine 25 mg tid.  -continue home meds -monitor BP, make changes as needed   History of CVA History of recent CVA in September 2021 which patient was last admitted. Prior to stroke, patient ambulated without assistance. Now patient is wheelchair bound but also uses a walker and cane to assist with ambulation. Home meds include aspirin 81 mg and atorvastatin 80 mg. -continue home meds -PT/OT eval and treat  GERD Home meds include omeprazole 40 mg daily. -continue home med    FEN/GI: heart  healthy/carb modified diet  Prophylaxis: Lovenox   Disposition: med surg, attending Dr. Ardelia Mems   History of Present Illness:  Tony Schmitt is a 49 y.o. male presenting with progressively worsening leg swelling that started when patient was  discharged from [facility] following a stroke a month ago. States that it was been getting worse which is the reason for patient coming in today. Went to PCP who instructed patient to go to mobile clinic that recommended going to the hospital. Denies any pain but only 2 legs and left arm pain. Denies chest pain, dyspnea and prior recent illness. Uses a wheelchair, cane and walker to assist with ambulation.   Review Of Systems: Per HPI with the following additions:   Review of Systems  Constitutional: Negative for chills and fever.  Respiratory: Negative for cough, chest tightness and shortness of breath.   Cardiovascular: Positive for leg swelling. Negative for chest pain.  Gastrointestinal: Negative for abdominal pain.     Patient Active Problem List   Diagnosis Date Noted  . Abnormality of gait 05/23/2020  . Type 2 diabetes mellitus with hyperglycemia, without long-term current use of insulin ([place])   . Thrombocytosis   . Benign essential HTN   . Hypoalbuminemia due to protein-calorie malnutrition ([place])   . Acute blood loss anemia   . Leukocytosis   . Slow transit constipation   . AKI (acute kidney injury) ([place])   . Diabetes mellitus type 2 with complications, uncontrolled ([place])   . CVA (cerebral vascular accident) ([place]) 04/22/2020  . Acute left-sided weakness   . Tobacco abuse   . Essential hypertension   . Acute CVA (cerebrovascular accident) ([place]) 04/16/2020  . Hypertensive urgency 04/16/2020  . Type 2 diabetes mellitus with vascular disease ([place]) 04/16/2020  . CKD (chronic kidney disease) stage 3, GFR 30-59 ml/min (H[facility]) 04/23/2019  . Hyperlipidemia 04/18/2019  . Dyslipidemia associated with type 2 diabetes mellitus ([place]) 08/29/2018  . Hypertension associated with type 2 diabetes mellitus ([place]) 08/29/2018    Past Medical History: Past Medical History:  Diagnosis Date  . Diabetes mellitus without complication ([place])   . Hypertension     Past Surgical History: Past Surgical  History:  Procedure Laterality Date  . BUBBLE STUDY  04/18/2020   Procedure: BUBBLE STUDY;  Surgeon: Sueanne Margarita, MD;  Location: [facility];  Service: Cardiovascular;;  . TEE WITHOUT CARDIOVERSION N/A 04/18/2020   Procedure: TRANSESOPHAGEAL ECHOCARDIOGRAM (TEE);  Surgeon: Sueanne Margarita, MD;  Location: [facility] ENDOSCOPY;  Service: Cardiovascular;  Laterality: N/A;    Social History: Social History   Tobacco Use  . Smoking status: Former Smoker    Types: Cigarettes, Cigars  . Smokeless tobacco: Never Used  Vaping Use  . Vaping Use: Never used  Substance Use Topics  . Alcohol use: Not Currently  . Drug use: Never   Additional social history:  Please also refer to relevant sections of EMR.  Family History: Family History  Problem Relation Age of Onset  . Diabetes Mother   . Diabetes Father   . Hypertension Father   . Cancer Father   . Heart failure Other      Allergies and Medications: No Known Allergies No current facility-administered medications on file prior to encounter.   Current Outpatient Medications on File Prior to Encounter  Medication Sig Dispense Refill  . acetaminophen (TYLENOL) 325 MG tablet Take 2 tablets (650 mg total) by mouth every 4 (four) hours as needed for mild pain (or temp > 37.5 C (99.5 F)).    Marland Kitchen  amLODipine (NORVASC) 10 MG tablet Take 1 tablet (10 mg total) by mouth daily. 30 tablet 3  . aspirin 81 MG chewable tablet Chew 1 tablet (81 mg total) by mouth daily. 30 tablet 0  . atorvastatin (LIPITOR) 80 MG tablet Take 1 tablet (80 mg total) by mouth at bedtime. 30 tablet 3  . carvedilol (COREG) 12.5 MG tablet Take 1 tablet (12.5 mg total) by mouth 2 (two) times daily with a meal. 60 tablet 3  . glipiZIDE (GLUCOTROL) 5 MG tablet Take 1 tablet (5 mg total) by mouth 2 (two) times daily before a meal. 60 tablet 3  . hydrALAZINE (APRESOLINE) 25 MG tablet Take 1 tablet (25 mg total) by mouth 3 (three) times daily. 90 tablet 3  . omeprazole (PRILOSEC) 40  MG capsule Take 1 capsule (40 mg total) by mouth daily. 30 capsule 3  . polyethylene glycol (MIRALAX / GLYCOLAX) 17 g packet Take 17 g by mouth 2 (two) times daily. 14 each 0  . senna-docusate (SENOKOT-S) 8.6-50 MG tablet Take 2 tablets by mouth at bedtime.    . sodium bicarbonate 650 MG tablet Take 2 tablets (1,300 mg total) by mouth 2 (two) times daily. 120 tablet 0  . Vitamin D, Ergocalciferol, (DRISDOL) 1.25 MG (50000 UNIT) CAPS capsule Take 1 capsule (50,000 Units total) by mouth every 7 (seven) days. 4 capsule 2    Objective: BP (!) 162/78   Pulse 84   Temp 98.6 F (37 C) (Oral)   Resp 16   Ht 5\' 4"  (1.626 m)   Wt 77.1 kg   SpO2 98%   BMI 29.18 kg/m  Exam: General: Patient sitting upright comfortably in bed, in no acute distress. Neck: supple neck, non-tender thyroid Cardiovascular: RRR, no murmurs or gallops auscultated  Respiratory: diffuse crackles noted without focal findings  Gastrointestinal: soft, nontender, presence of active bowel sounds Ext: radial and distal pulses intact bilaterally, moderate LE edema noted bilaterally, edema noted along left UE Derm: skin warm and dry to touch, no rashes or lesions noted Neuro: AOx4 Psych: mood appropriate   Labs and Imaging: CBC BMET  Recent Labs  Lab 06/10/20 1114  WBC 10.3  HGB 9.7*  HCT 31.1*  PLT 469*   Recent Labs  Lab 06/10/20 1114  NA 143  K 3.9  CL 110  CO2 23  BUN 31*  CREATININE 3.42*  GLUCOSE 179*  CALCIUM 9.0     EKG: normal sinus rhythm     Donney Dice, DO 06/10/2020, 4:13 PM PGY-1, [place] Intern pager: [phone], text pages welcome  FPTS Upper-Level Resident Addendum   I have independently interviewed and examined the patient. I have discussed the above with the original author and agree with their documentation. My edits for correction/addition/clarification are in blue.Please see also any attending notes.   Gifford Shave, MD PGY-2, [place]  [facility] 06/10/2020 9:13 PM  [facility] Service pager: [phone] (text pages welcome through [place])

## 2020-06-10 NOTE — Telephone Encounter (Signed)
Medication Refill - Medication:  sodium bicarbonate 650 MG tablet   Has the patient contacted their pharmacy? Yes.   (Agent: If no, request that the patient contact the pharmacy for the refill.) (Agent: If yes, when and what did the pharmacy advise?)  Preferred Pharmacy (with phone number or street name):  [facility] ([address]), [place] [address]  [address] [place] ([place]) [place] [address]  Phone: [phone] Fax: [phone]     Agent: Please be advised that RX refills may take up to 3 business days. We ask that you follow-up with your pharmacy.

## 2020-06-10 NOTE — ED Notes (Signed)
Dinner Tray Ordered @ 1747. 

## 2020-06-10 NOTE — Progress Notes (Signed)
NEW ADMISSION NOTE  Arrival Method: bed Mental Orientation: Alert and oriented x4. Telemetry: yes Assessment: Completed Skin: see notes Iv: right upper arm Pain: 0 Tubes: 0 Safety Measures: Safety Fall Prevention Plan has been given, discussed and signed Admission: Completed [facility] Orientation: Patient has been orientated to the room, unit and staff.  Family: 1, wife  Orders have been reviewed and implemented. Will continue to monitor the patient. Call light has been placed within reach and bed alarm has been activated.    Beatris Ship, RN

## 2020-06-10 NOTE — Telephone Encounter (Signed)
Pt called has swelling in his thighs

## 2020-06-10 NOTE — ED Triage Notes (Signed)
Pt arrives to ED with c/o of leg swelling since going home from hospital around 10/7 after having a stroke. Pt also has has some pain, swelling worse on right leg.

## 2020-06-10 NOTE — Telephone Encounter (Signed)
Requested medication (s) are due for refill today:  Yes  Requested medication (s) are on the active medication list:  Yes  Future visit scheduled:  Yes  Last Refill: 05/11/20; #120; no refills  Notes to clinic: medication was last ordered by a PA upon discharge from hospital 05/12/20.   Requested Prescriptions  Pending Prescriptions Disp Refills   sodium bicarbonate 650 MG tablet 120 tablet 0    Sig: Take 2 tablets (1,300 mg total) by mouth 2 (two) times daily.      Endocrinology:  Minerals Passed - 06/10/2020 11:38 AM      Passed - Valid encounter within last 12 months    Recent Outpatient Visits           2 weeks ago Hemiparesis affecting left side as late effect of cerebrovascular accident [facility])   [place], [place], MD   9 months ago Hypertension associated with type 2 diabetes mellitus [facility])   [facility] [place], [place], MD   1 year ago Hypertension associated with type 2 diabetes mellitus [facility])   Primary Care at [facility], [place], MD   1 year ago Hypertension associated with type 2 diabetes mellitus [facility])   Primary Care at [facility], [place], MD   1 year ago Type 2 diabetes mellitus with hyperglycemia, without long-term current use of insulin [facility])   Primary Care at [facility], [place]es [place], MD       Future Appointments             In 2 months Charlott Rakes, MD [place]

## 2020-06-10 NOTE — ED Provider Notes (Signed)
[facility] EMERGENCY DEPARTMENT Provider Note   CSN: [id number] Arrival date & time: 06/10/20  1046     History Chief Complaint  Patient presents with  . Leg Swelling    Tony Schmitt is a 49 y.o. male.  HPI 49 year old male with a history of DM type II, hypertension, recent CVA, discharged from the hospital on 10/7, presents to the ER with complaints of lower extremity swelling that has been gradually creeping up to his thighs.  Patient states that the swelling has been causing him pain and.  He denies any chest pain or shortness of breath.  No prior history of heart failure, echo done here in the hospital in September which showed an EF of 60 to 65%.  He states he has a pending appointment on 11/17 with internal medicine but states that it is too long for him to wait as his legs are good continuing to cause him discomfort.  He is denying any chest pain or shortness of breath, however is audibly wheezing.  He is not on a blood thinner, not on any diuretics.  No prior history of blood clots.    Past Medical History:  Diagnosis Date  . Diabetes mellitus without complication ([place])   . Hypertension     Patient Active Problem List   Diagnosis Date Noted  . Abnormality of gait 05/23/2020  . Type 2 diabetes mellitus with hyperglycemia, without long-term current use of insulin ([place])   . Thrombocytosis   . Benign essential HTN   . Hypoalbuminemia due to protein-calorie malnutrition ([place])   . Acute blood loss anemia   . Leukocytosis   . Slow transit constipation   . AKI (acute kidney injury) ([place])   . Diabetes mellitus type 2 with complications, uncontrolled ([place])   . CVA (cerebral vascular accident) ([place]) 04/22/2020  . Acute left-sided weakness   . Tobacco abuse   . Essential hypertension   . Acute CVA (cerebrovascular accident) ([place]) 04/16/2020  . Hypertensive urgency 04/16/2020  . Type 2 diabetes mellitus with vascular disease ([place]) 04/16/2020  . CKD (chronic  kidney disease) stage 3, GFR 30-59 ml/min ([facility]) 04/23/2019  . Hyperlipidemia 04/18/2019  . Dyslipidemia associated with type 2 diabetes mellitus ([place]) 08/29/2018  . Hypertension associated with type 2 diabetes mellitus ([place]) 08/29/2018    Past Surgical History:  Procedure Laterality Date  . BUBBLE STUDY  04/18/2020   Procedure: BUBBLE STUDY;  Surgeon: Sueanne Margarita, MD;  Location: [facility];  Service: Cardiovascular;;  . TEE WITHOUT CARDIOVERSION N/A 04/18/2020   Procedure: TRANSESOPHAGEAL ECHOCARDIOGRAM (TEE);  Surgeon: Sueanne Margarita, MD;  Location: [facility] ENDOSCOPY;  Service: Cardiovascular;  Laterality: N/A;       Family History  Problem Relation Age of Onset  . Diabetes Mother   . Diabetes Father   . Hypertension Father   . Cancer Father   . Heart failure Other     Social History   Tobacco Use  . Smoking status: Former Smoker    Types: Cigarettes, Cigars  . Smokeless tobacco: Never Used  Vaping Use  . Vaping Use: Never used  Substance Use Topics  . Alcohol use: Not Currently  . Drug use: Never    Home Medications Prior to Admission medications   Medication Sig Start Date End Date Taking? Authorizing Provider  acetaminophen (TYLENOL) 325 MG tablet Take 2 tablets (650 mg total) by mouth every 4 (four) hours as needed for mild pain (or temp > 37.5 C (99.5 F)). 05/11/20  Angiulli, Lavon Paganini, PA-C  amLODipine (NORVASC) 10 MG tablet Take 1 tablet (10 mg total) by mouth daily. 05/26/20   Charlott Rakes, MD  aspirin 81 MG chewable tablet Chew 1 tablet (81 mg total) by mouth daily. 04/20/20   Regalado, Belkys A, MD  atorvastatin (LIPITOR) 80 MG tablet Take 1 tablet (80 mg total) by mouth at bedtime. 05/26/20   Charlott Rakes, MD  carvedilol (COREG) 12.5 MG tablet Take 1 tablet (12.5 mg total) by mouth 2 (two) times daily with a meal. 05/26/20   Charlott Rakes, MD  glipiZIDE (GLUCOTROL) 5 MG tablet Take 1 tablet (5 mg total) by mouth 2 (two) times daily before a meal.  05/26/20   Charlott Rakes, MD  hydrALAZINE (APRESOLINE) 25 MG tablet Take 1 tablet (25 mg total) by mouth 3 (three) times daily. 05/26/20   Charlott Rakes, MD  omeprazole (PRILOSEC) 40 MG capsule Take 1 capsule (40 mg total) by mouth daily. 05/26/20   Charlott Rakes, MD  polyethylene glycol (MIRALAX / GLYCOLAX) 17 g packet Take 17 g by mouth 2 (two) times daily. 05/11/20   Angiulli, Lavon Paganini, PA-C  senna-docusate (SENOKOT-S) 8.6-50 MG tablet Take 2 tablets by mouth at bedtime. 05/11/20   Angiulli, Lavon Paganini, PA-C  sodium bicarbonate 650 MG tablet Take 2 tablets (1,300 mg total) by mouth 2 (two) times daily. 05/11/20   Angiulli, Lavon Paganini, PA-C  Vitamin D, Ergocalciferol, (DRISDOL) 1.25 MG (50000 UNIT) CAPS capsule Take 1 capsule (50,000 Units total) by mouth every 7 (seven) days. 06/07/20   Mayers, Cari S, PA-C    Allergies    Patient has no known allergies.  Review of Systems   Review of Systems  Constitutional: Negative for chills and fever.  HENT: Negative for ear pain and sore throat.   Eyes: Negative for pain and visual disturbance.  Respiratory: Negative for cough and shortness of breath.   Cardiovascular: Positive for leg swelling. Negative for chest pain and palpitations.  Gastrointestinal: Negative for abdominal pain and vomiting.  Genitourinary: Negative for dysuria and hematuria.  Musculoskeletal: Negative for arthralgias and back pain.  Skin: Negative for color change and rash.  Neurological: Negative for seizures and syncope.  All other systems reviewed and are negative.   Physical Exam Updated Vital Signs BP (!) 162/78   Pulse 84   Temp 98.6 F (37 C) (Oral)   Resp 16   Ht 5\' 4"  (1.626 m)   Wt 77.1 kg   SpO2 98%   BMI 29.18 kg/m   Physical Exam Vitals and nursing note reviewed.  Constitutional:      Appearance: He is well-developed.  HENT:     Head: Normocephalic and atraumatic.  Eyes:     Conjunctiva/sclera: Conjunctivae normal.  Cardiovascular:     Rate  and Rhythm: Normal rate and regular rhythm.     Heart sounds: No murmur heard.   Pulmonary:     Effort: Pulmonary effort is normal. No respiratory distress.     Breath sounds: Normal breath sounds.  Abdominal:     General: There is distension.     Palpations: Abdomen is soft.     Tenderness: There is no abdominal tenderness.     Comments: Mildly distended, nontender   Musculoskeletal:     Cervical back: Neck supple.     Right lower leg: Edema present.     Left lower leg: Edema present.     Comments: 2+ RLE Edema, nonpitting. Not warm, no erythema, but R more swollen than  left  Skin:    General: Skin is warm and dry.  Neurological:     Mental Status: He is alert.     ED Results / Procedures / Treatments   Labs (all labs ordered are listed, but only abnormal results are displayed) Labs Reviewed  BASIC METABOLIC PANEL - Abnormal; Notable for the following components:      Result Value   Glucose, Bld 179 (*)    BUN 31 (*)    Creatinine, Ser 3.42 (*)    GFR, Estimated 21 (*)    All other components within normal limits  CBC - Abnormal; Notable for the following components:   RBC 3.57 (*)    Hemoglobin 9.7 (*)    HCT 31.1 (*)    Platelets 469 (*)    All other components within normal limits  HEPATIC FUNCTION PANEL - Abnormal; Notable for the following components:   Total Protein 5.6 (*)    Albumin 2.0 (*)    Alkaline Phosphatase 136 (*)    All other components within normal limits  RESPIRATORY PANEL BY RT PCR (FLU A&B, COVID)  BRAIN NATRIURETIC PEPTIDE  URINALYSIS, ROUTINE W REFLEX MICROSCOPIC  CBG MONITORING, ED    EKG None  Radiology DG Chest Portable 1 View  Result Date: 06/10/2020 CLINICAL DATA:  Leg swelling. EXAM: PORTABLE CHEST 1 VIEW COMPARISON:  Chest x-ray 08/02/2018. FINDINGS: Cardiomegaly with bilateral perihilar interstitial prominence and small bilateral pleural effusions. Findings suggest CHF. Pneumonitis cannot be excluded. Low lung volumes. No  pneumothorax. IMPRESSION: Cardiomegaly with bilateral perihilar interstitial prominence and small bilateral pleural effusions. Findings suggest CHF. Pneumonitis cannot be excluded. Low lung volumes. Electronically Signed   By: Marcello [place]  Register   On: 06/10/2020 14:03   VAS [place] LOWER EXTREMITY VENOUS (DVT) (ONLY [facility] & [facility])  Result Date: 06/10/2020  Lower Venous DVT Study Indications: Swelling, and Edema.  Risk Factors: None identified. Performing Technologist: Griffin Basil RCT RDMS  Examination Guidelines: A complete evaluation includes B-mode imaging, spectral Doppler, color Doppler, and power Doppler as needed of all accessible portions of each vessel. Bilateral testing is considered an integral part of a complete examination. Limited examinations for reoccurring indications may be performed as noted. The reflux portion of the exam is performed with the patient in reverse Trendelenburg.  +---------+---------------+---------+-----------+----------+--------------+ RIGHT    CompressibilityPhasicitySpontaneityPropertiesThrombus Aging +---------+---------------+---------+-----------+----------+--------------+ CFV      Full           Yes      Yes                                 +---------+---------------+---------+-----------+----------+--------------+ SFJ      Full                                                        +---------+---------------+---------+-----------+----------+--------------+ FV Prox  Full                                                        +---------+---------------+---------+-----------+----------+--------------+ FV Mid   Full                                                        +---------+---------------+---------+-----------+----------+--------------+  FV DistalFull                                                        +---------+---------------+---------+-----------+----------+--------------+ PFV      Full                                                         +---------+---------------+---------+-----------+----------+--------------+ POP      Full           Yes      Yes                                 +---------+---------------+---------+-----------+----------+--------------+ PTV      Full                                                        +---------+---------------+---------+-----------+----------+--------------+ PERO     Full                                                        +---------+---------------+---------+-----------+----------+--------------+   +---------+---------------+---------+-----------+----------+--------------+ LEFT     CompressibilityPhasicitySpontaneityPropertiesThrombus Aging +---------+---------------+---------+-----------+----------+--------------+ CFV      Full           Yes      Yes                                 +---------+---------------+---------+-----------+----------+--------------+ SFJ      Full                                                        +---------+---------------+---------+-----------+----------+--------------+ FV Prox  Full                                                        +---------+---------------+---------+-----------+----------+--------------+ FV Mid   Full                                                        +---------+---------------+---------+-----------+----------+--------------+ FV DistalFull                                                        +---------+---------------+---------+-----------+----------+--------------+  PFV      Full                                                        +---------+---------------+---------+-----------+----------+--------------+ POP      Full           Yes      Yes                                 +---------+---------------+---------+-----------+----------+--------------+ PTV      Full                                                         +---------+---------------+---------+-----------+----------+--------------+ PERO     Full                                                        +---------+---------------+---------+-----------+----------+--------------+     Summary: BILATERAL: - No evidence of deep vein thrombosis seen in the lower extremities, bilaterally. - No evidence of superficial venous thrombosis in the lower extremities, bilaterally. -   *See table(s) above for measurements and observations.    Preliminary     Procedures Procedures (including critical care time)  Medications Ordered in ED Medications - No data to display  ED Course  I have reviewed the triage vital signs and the nursing notes.  Pertinent labs & imaging results that were available during my care of the patient were reviewed by me and considered in my medical decision making (see chart for details).    MDM Rules/Calculators/A&P                          49 year old male with progressive worsening lower extremity edema over the past month On presentation, he is alert, oriented, nontoxic-appearing, no acute distress, resting comfortably in the ER bed, speaking full sentences.  He does have a noticeable audible wheeze, however he denies any chest pain or shortness of breath.  He is mildly hypertensive, however vitals overall reassuring.  No evidence of hypoxia.  Sickle exam with nonpitting 2+ lower extremity edema.  Right is slightly more swollen than left.  However no significant erythema, warmth.  No signs of infection.  DDx includes CHF versus DVT  Labs reviewed and interpreted by me -BMP without any significant electrode abnormalities, creatinine of 3.42 which is a point elevated from prior.  There is evidence of an AKI.  BUN is also elevated at 31.  Hepatic function panel without any significant abnormalities -BNP is normal  Chest x-ray reviewed by me -Evidence of bilateral perihilar effusion, with cardiac enlargement, likely in the setting  of CHF. -DVT ultrasound study negative bilaterally  EKG reviewed by myself and my supervising physician -EKG normal sinus rhythm  MDM: Patient with progressive lower extremity edema over the last month in the setting of an AKI and possibly worsening heart failure.  Spoke with the hospitalist, medicine  team, they will admit the patient for further diuresis and evaluation and management.  Case discussed with Dr. Laverta Baltimore who is agreeable to the above plan and disposition  Final Clinical Impression(s) / ED Diagnoses Final diagnoses:  Peripheral edema  AKI (acute kidney injury) [facility])    Rx / [place] Orders ED Discharge Orders    None       Lyndel Safe 06/10/20 [place], Wonda Olds, MD 06/11/20 [phone]

## 2020-06-10 NOTE — Telephone Encounter (Signed)
Spoke to patient and informed he will need to go to Emergency department to be evaluated to his thigh swelling w/ Pain. Pt. Understood.

## 2020-06-11 ENCOUNTER — Encounter (HOSPITAL_COMMUNITY): Payer: Self-pay | Admitting: Family Medicine

## 2020-06-11 LAB — CBC WITH DIFFERENTIAL/PLATELET
Abs Immature Granulocytes: 0.03 10*3/uL (ref 0.00–0.07)
Basophils Absolute: 0.1 10*3/uL (ref 0.0–0.1)
Basophils Relative: 1 %
Eosinophils Absolute: 0.2 10*3/uL (ref 0.0–0.5)
Eosinophils Relative: 3 %
HCT: 29.2 % — ABNORMAL LOW (ref 39.0–52.0)
Hemoglobin: 9.3 g/dL — ABNORMAL LOW (ref 13.0–17.0)
Immature Granulocytes: 0 %
Lymphocytes Relative: 24 %
Lymphs Abs: 2.2 10*3/uL (ref 0.7–4.0)
MCH: 27.4 pg (ref 26.0–34.0)
MCHC: 31.8 g/dL (ref 30.0–36.0)
MCV: 85.9 fL (ref 80.0–100.0)
Monocytes Absolute: 0.8 10*3/uL (ref 0.1–1.0)
Monocytes Relative: 8 %
Neutro Abs: 6.1 10*3/uL (ref 1.7–7.7)
Neutrophils Relative %: 64 %
Platelets: 453 10*3/uL — ABNORMAL HIGH (ref 150–400)
RBC: 3.4 MIL/uL — ABNORMAL LOW (ref 4.22–5.81)
RDW: 15 % (ref 11.5–15.5)
WBC: 9.5 10*3/uL (ref 4.0–10.5)
nRBC: 0 % (ref 0.0–0.2)

## 2020-06-11 LAB — BASIC METABOLIC PANEL
Anion gap: 9 (ref 5–15)
BUN: 26 mg/dL — ABNORMAL HIGH (ref 6–20)
CO2: 23 mmol/L (ref 22–32)
Calcium: 9.2 mg/dL (ref 8.9–10.3)
Chloride: 112 mmol/L — ABNORMAL HIGH (ref 98–111)
Creatinine, Ser: 3.2 mg/dL — ABNORMAL HIGH (ref 0.61–1.24)
GFR, Estimated: 23 mL/min — ABNORMAL LOW (ref >60)
Glucose, Bld: 110 mg/dL — ABNORMAL HIGH (ref 70–99)
Potassium: 3.6 mmol/L (ref 3.5–5.1)
Sodium: 144 mmol/L (ref 135–145)

## 2020-06-11 LAB — SODIUM, URINE, RANDOM: Sodium, Ur: 94 mmol/L

## 2020-06-11 LAB — HEPATITIS PANEL, ACUTE
HCV Ab: NONREACTIVE
Hep A IgM: NONREACTIVE
Hep B C IgM: NONREACTIVE
Hepatitis B Surface Ag: NONREACTIVE

## 2020-06-11 LAB — CREATININE, URINE, RANDOM: Creatinine, Urine: 87.72 mg/dL

## 2020-06-11 LAB — GLUCOSE, CAPILLARY
Glucose-Capillary: 109 mg/dL — ABNORMAL HIGH (ref 70–99)
Glucose-Capillary: 117 mg/dL — ABNORMAL HIGH (ref 70–99)
Glucose-Capillary: 159 mg/dL — ABNORMAL HIGH (ref 70–99)
Glucose-Capillary: 147 mg/dL — ABNORMAL HIGH (ref 70–99)

## 2020-06-11 LAB — MAGNESIUM: Magnesium: 1.5 mg/dL — ABNORMAL LOW (ref 1.7–2.4)

## 2020-06-11 LAB — HIV ANTIBODY (ROUTINE TESTING W REFLEX): HIV Screen 4th Generation wRfx: NONREACTIVE

## 2020-06-11 MED ORDER — FUROSEMIDE 10 MG/ML IJ SOLN
40.0000 mg | Freq: Once | INTRAMUSCULAR | Status: AC
  Administered 2020-06-11: 40 mg via INTRAVENOUS
  Filled 2020-06-11: qty 4

## 2020-06-11 MED ORDER — FUROSEMIDE 10 MG/ML IJ SOLN
120.0000 mg | Freq: Two times a day (BID) | INTRAVENOUS | Status: DC
  Administered 2020-06-11 – 2020-06-13 (×4): 120 mg via INTRAVENOUS
  Filled 2020-06-11: qty 10
  Filled 2020-06-11 (×3): qty 12
  Filled 2020-06-11 (×2): qty 10

## 2020-06-11 MED ORDER — POLYETHYLENE GLYCOL 3350 17 G PO PACK
17.0000 g | PACK | Freq: Every day | ORAL | Status: DC
  Administered 2020-06-12: 17 g via ORAL
  Filled 2020-06-11 (×2): qty 1

## 2020-06-11 MED ORDER — POLYETHYLENE GLYCOL 3350 17 G PO PACK: 17.0000 g | PACK | Freq: Every day | ORAL | Status: DC

## 2020-06-11 NOTE — Evaluation (Signed)
Occupational Therapy Evaluation Patient Details Name: Tony Schmitt MRN: [id number] DOB: [birth year] Today's Date: 06/11/2020    History of Present Illness Pt is a 49 year old man admitted with B LE edema and AKI on CKD. PMH: recent CVA with residual L side weakness, DM, HTN, CKD III, tobacco abuse, medical non compliance.    Clinical Impression   Pt lives with his mom. He reports walking with a RW with adapted L handle or using a w/c. He is [facility] O[facility]T at [facility]. Pt is typically modified independent in self care. He presents with L hemiparesis with neoprene thumb splint intact. He requires set up to max assist for ADL with edema interfering with his ability to access his feet for bathing and dressing. Pt cooperative and motivated to maintain independence and mobility. Will follow acutely.    Follow Up Recommendations  Outpatient OT    Equipment Recommendations  None recommended by OT    Recommendations for Other Services       Precautions / Restrictions Precautions Precautions: Fall      Mobility Bed Mobility Overal bed mobility: Needs Assistance Bed Mobility: Supine to Sit     Supine to sit: Supervision     General bed mobility comments: HOB up, use of rail, increased time    Transfers Overall transfer level: Needs assistance Equipment used: Rolling walker (2 wheeled) Transfers: Sit to/from Stand Sit to Stand: Min assist         General transfer comment: min assist to place L hand on walker and maintain    Balance Overall balance assessment: Needs assistance   Sitting balance-Leahy Scale: Fair     Standing balance support: Bilateral upper extremity supported Standing balance-Leahy Scale: Poor                             ADL either performed or assessed with clinical judgement   ADL Overall ADL's : Needs assistance/impaired Eating/Feeding: Set up;Sitting   Grooming: Set up;Sitting   Upper Body Bathing: Minimal  assistance;Sitting   Lower Body Bathing: Maximal assistance;Sit to/from stand   Upper Body Dressing : Minimal assistance;Sitting   Lower Body Dressing: Maximal assistance;Sit to/from stand   Toilet Transfer: Minimal assistance;Stand-pivot;RW   Toileting- Clothing Manipulation and Hygiene: Maximal assistance;Sit to/from stand       Functional mobility during ADLs: Minimal assistance;Rolling walker       Vision Baseline Vision/History: No visual deficits       Perception     Praxis      Pertinent Vitals/Pain Pain Assessment: No/denies pain     Hand Dominance Right   Extremity/Trunk Assessment Upper Extremity Assessment Upper Extremity Assessment: LUE deficits/detail LUE Deficits / Details: residual weakness, mild edema, neoprene thumb splint donned, but also has a resting hand splint at home for night use LUE Coordination: decreased fine motor;decreased gross motor   Lower Extremity Assessment Lower Extremity Assessment: Defer to PT evaluation       Communication Communication Communication: No difficulties   Cognition Arousal/Alertness: Awake/alert Behavior During Therapy: WFL for tasks assessed/performed Overall Cognitive Status: Within Functional Limits for tasks assessed                                     General Comments       Exercises     Shoulder Instructions      Home Living  Family/patient expects to be discharged to:: Private residence Living Arrangements: Parent (mom) Available Help at Discharge: Available 24 hours/day;Family Type of Home: House Home Access: Stairs to enter [employer] of Steps: 2 Entrance Stairs-Rails: None Home Layout: One level     Bathroom Shower/Tub: Corporate investment banker: Standard     Home Equipment: Environmental consultant - 2 wheels;Tub bench;Hand held shower head;Wheelchair - manual;Cane - single point;Other (comment) (bed rail)          Prior Functioning/Environment Level  of Independence: Needs assistance  Gait / Transfers Assistance Needed: walks with a walker with L walker splint or uses a w/c ADL's / Homemaking Assistance Needed: reports independence in ADL, sits to shower on bench, mom assists with IADL.   Comments: Pt finished 3 weeks in CIR and now receives PT at [facility] outpatient        OT Problem List: Decreased strength;Impaired balance (sitting and/or standing);Decreased coordination;Impaired UE functional use;Increased edema;Decreased range of motion      OT Treatment/Interventions: Self-care/ADL training;DME and/or AE instruction;Therapeutic activities;Patient/family education;Balance training;Therapeutic exercise    OT Goals(Current goals can be found in the care plan section) Acute Rehab OT Goals Patient Stated Goal: return home OT Goal Formulation: With patient Time For Goal Achievement: 06/25/20 Potential to Achieve Goals: Good ADL Goals Pt Will Perform Grooming: standing;with supervision Pt Will Perform Upper Body Bathing: with set-up;sitting Pt Will Perform Lower Body Bathing: with supervision;sitting/lateral leans Pt Will Perform Upper Body Dressing: with set-up;sitting Pt Will Perform Lower Body Dressing: with supervision;sit to/from stand Pt Will Transfer to Toilet: with supervision;ambulating;regular height toilet Pt Will Perform Toileting - Clothing Manipulation and hygiene: with supervision;sit to/from stand Pt/caregiver will Perform Home Exercise Program: Increased ROM;Left upper extremity;Independently  OT Frequency: Min 2X/week   Barriers to D/C:            Co-evaluation              AM-PAC OT "6 Clicks" Daily Activity     Outcome Measure Help from another person eating meals?: A Little Help from another person taking care of personal grooming?: A Little Help from another person toileting, which includes using toliet, bedpan, or urinal?: A Lot Help from another person bathing (including washing,  rinsing, drying)?: A Lot Help from another person to put on and taking off regular upper body clothing?: A Little Help from another person to put on and taking off regular lower body clothing?: A Lot 6 Click Score: 15   End of Session Equipment Utilized During Treatment: Gait belt;Rolling walker  Activity Tolerance: Patient tolerated treatment well Patient left: in bed;with call bell/phone within reach;with nursing/sitter in room  OT Visit Diagnosis: Unsteadiness on feet (R26.81);Other abnormalities of gait and mobility (R26.89);Hemiplegia and hemiparesis Hemiplegia - Right/Left: Left Hemiplegia - dominant/non-dominant: Non-Dominant Hemiplegia - caused by: Cerebral infarction                Time: 6606-3016 OT Time Calculation (min): 13 min Charges:  OT General Charges $OT Visit: 1 Visit OT Evaluation $OT Eval Moderate Complexity: 1 Mod  Nestor Lewandowsky, OTR/L Acute Rehabilitation Services Pager: [phone] Office: [phone]  Malka So 06/11/2020, 11:25 AM

## 2020-06-11 NOTE — Progress Notes (Signed)
Inpatient Rehab Admissions Coordinator Note:   Per PT recommendation, pt was screened for CIR candidacy by Gayland Curry, MS, CCC-SLP.  At this time we are recommending an inpatient rehab consult.  AC will contact attending to request consult order.  Please contact me with questions.    Gayland Curry, [place], CCC-SLP Admissions Coordinator [phone] 06/11/20 4:31 PM

## 2020-06-11 NOTE — Consult Note (Signed)
Renal Service Consult Note [facility]  Tony Schmitt 06/11/2020 Sol Blazing, MD Requesting Physician: Dr Ardelia Mems  Reason for Consult: Renal failure HPI: The patient is a 49 y.o. year-old w/ hx of HTN, DM2, recent CVA and CKD IV presented to ED yest for worsening leg edema progressing over the last month. In ED w/u showed CXR w/ IS edema. Last echo here was EF 60-65%. Also has low alb 2.0 and UA protein > 300. Pt admitted and started on IV lasix. Asked to see for renal failure.    BP's high on admission. Will be getting home meds here (NV, coreg, hydralazine). Hx CVA in Sept 2021, pt is now WC bound. Pt poor historian. Denies sig SOB , +leg swelling for "a while".  No nsaid use. No change in UOP or color.   He was here in Sept 2021 w/ acute CVA , left-sided weakness and seen by nephrology for DM2, HTN and AoCKD 3b > now at CKD IV due to DM/ HTN. UPC ratio was 12. Creat was low 3's. Renal US was neg for obstruction. OP f/u was arranged.    Past Medical History  Past Medical History:  Diagnosis Date  . Diabetes mellitus without complication ([place])   . Hypertension    Past Surgical History  Past Surgical History:  Procedure Laterality Date  . BUBBLE STUDY  04/18/2020   Procedure: BUBBLE STUDY;  Surgeon: Sueanne Margarita, MD;  Location: [facility];  Service: Cardiovascular;;  . TEE WITHOUT CARDIOVERSION N/A 04/18/2020   Procedure: TRANSESOPHAGEAL ECHOCARDIOGRAM (TEE);  Surgeon: Sueanne Margarita, MD;  Location: [facility] ENDOSCOPY;  Service: Cardiovascular;  Laterality: N/A;   Family History  Family History  Problem Relation Age of Onset  . Diabetes Mother   . Diabetes Father   . Hypertension Father   . Cancer Father   . Heart failure Other    Social History  reports that he has quit smoking. His smoking use included cigarettes and cigars. He has never used smokeless tobacco. He reports previous alcohol use. He reports that he does not use drugs. Allergies  Allergies   Allergen Reactions  . Latex Itching   Home medications Prior to Admission medications   Medication Sig Start Date End Date Taking? Authorizing Provider  acetaminophen (TYLENOL) 325 MG tablet Take 2 tablets (650 mg total) by mouth every 4 (four) hours as needed for mild pain (or temp > 37.5 C (99.5 F)). 05/11/20  Yes Angiulli, Lavon Paganini, PA-C  amLODipine (NORVASC) 10 MG tablet Take 1 tablet (10 mg total) by mouth daily. 05/26/20  Yes Charlott Rakes, MD  aspirin 81 MG chewable tablet Chew 1 tablet (81 mg total) by mouth daily. Patient taking differently: Chew 81 mg by mouth at bedtime.  04/20/20  Yes Regalado, Belkys A, MD  atorvastatin (LIPITOR) 80 MG tablet Take 1 tablet (80 mg total) by mouth at bedtime. 05/26/20  Yes Charlott Rakes, MD  carvedilol (COREG) 12.5 MG tablet Take 1 tablet (12.5 mg total) by mouth 2 (two) times daily with a meal. 05/26/20  Yes Newlin, Enobong, MD  glipiZIDE (GLUCOTROL) 5 MG tablet Take 1 tablet (5 mg total) by mouth 2 (two) times daily before a meal. 05/26/20  Yes Newlin, Enobong, MD  hydrALAZINE (APRESOLINE) 25 MG tablet Take 1 tablet (25 mg total) by mouth 3 (three) times daily. 05/26/20  Yes Charlott Rakes, MD  omeprazole (PRILOSEC) 40 MG capsule Take 1 capsule (40 mg total) by mouth daily. 05/26/20  Yes Newlin,  Enobong, MD  polyethylene glycol (MIRALAX / GLYCOLAX) 17 g packet Take 17 g by mouth 2 (two) times daily. Patient taking differently: Take 17 g by mouth daily as needed for mild constipation (MIX AND DRINK).  05/11/20  Yes Angiulli, Lavon Paganini, PA-C  sodium bicarbonate 650 MG tablet Take 2 tablets (1,300 mg total) by mouth 2 (two) times daily. Patient taking differently: Take 1,300 mg by mouth See admin instructions. Take 1,300 mg by mouth at lunchtime and 5 PM 05/11/20  Yes Angiulli, Lavon Paganini, PA-C  Vitamin D, Ergocalciferol, (DRISDOL) 1.25 MG (50000 UNIT) CAPS capsule Take 1 capsule (50,000 Units total) by mouth every 7 (seven) days. Patient taking  differently: Take 50,000 Units by mouth every Wednesday.  06/07/20  Yes Mayers, Cari S, PA-C  senna-docusate (SENOKOT-S) 8.6-50 MG tablet Take 2 tablets by mouth at bedtime. Patient not taking: Reported on 06/10/2020 05/11/20   Cathlyn Parsons, PA-C     Vitals:   06/10/20 2300 06/11/20 0242 06/11/20 0630 06/11/20 0950  BP: (!) 148/73 140/80 (!) 167/92 (!) 160/85  Pulse: 85 80 87 87  Resp: '18 18 18 18  ' Temp: 98.4 F (36.9 C) 98.3 F (36.8 C) 98.6 F (37 C) 98.9 F (37.2 C)  TempSrc: Oral  Oral Oral  SpO2: 98% 99% 96% 96%  Weight: 77.2 kg     Height:       Exam Gen pt on RA at 96% sats No rash, cyanosis or gangrene Sclera anicteric, throat clear  No jvd or bruits Chest bilat basilar rales, bilat chest wall edema RRR no MRG Abd soft ntnd no mass or ascites +bs GU normal male MS no joint effusions or deformity Ext diffuse 2-3+ tight chronic bilat LE edema from feet to abd/ chest wall Neuro is alert, Ox 3 , no asterixis, L sided hemiparesis prior cva    Home meds:  - norvasc 10/ coreg 12.5 bid/ hydralazine 25 tid  - lipitor qd/ asa 81  - sod bicarb 2 bid/ prilosec 40  - glucotrol 5 bid  - prn's/ vitamins/ supplements     Date  Creat  eGFR (ml/min)    2019  1.9- 2.4    2020  AKI 3.3 > 1.6 / AKI 2.1 > 1.73    Jan 2021 1.84    Sept 2021 2.70- 3.50    Nov 2 3.44     Nov 5 3.42    Nov 6  3.20    UA 11/05 - clear , no bact, 0-5 rbc/ wbc, prot > 300     UNa, UCr pend      RUS - pend      CXR 11/5 - IMPRESSION: Cardiomegaly with bilateral perihilar interstitial prominence and small bilateral pleural effusions. Findings suggest CHF. Pneumonitis cannot be excluded. Low lung volumes.    Last echo - sept 2021 - LVEF 60-65%, no sig valve disease     Na 144 J 3,6  CI2 23  BUN 26  Cr 3.20  eGFR 23   Ca 9.2  Alb 2.0, LFT's ok     wBC 9.5  Hb 9.3  plt 453     Got IV lasix 69m last night and 40 mg IV lasix this am at 10 am.     Assessment/ Plan: 1. CKD 4 - b/l creat 2.7- 3.2  from Sept 2021, eGFR 21- 26 ml/min.  BP's high, marked volume overload on exam, pulm edema mild by exam, +CXR.   Pt was seen by renal  in Sept here. UPC ratio 12 then. Alb low here which could be related to neph syndrome.  Doubt biopsy would help w/ CKD 4.  Creat is near baseline here.  Will focus on getting fluid off, ^lasix 165m q 12 for now, adjust as needed. Send hep B/C and a few other serologies. Will follow.  2. DM2 - poorly controlled 3. HTN - poorly controlled 4. H/o CVA - w/ left hemiparesis , from Sept 2021 5. Anemia ckd / other - Hb 9's      Rob SJonnie Finner MD 06/11/2020, 2:06 PM  Recent Labs  Lab 06/10/20 1114 06/11/20 0707  WBC 10.3 9.5  HGB 9.7* 9.3*   Recent Labs  Lab 06/10/20 1114 06/11/20 0707  K 3.9 3.6  BUN 31* 26*  CREATININE 3.42* 3.20*  CALCIUM 9.0 9.2

## 2020-06-11 NOTE — Progress Notes (Signed)
Physical Therapy Evaluation Patient Details Name: Tony Schmitt MRN: [id number] DOB: 05/23/1971 Today's Date: 06/11/2020   History of Present Illness  Pt is a 49 year old man admitted with B LE edema and AKI on CKD. PMH: recent CVA with residual L side weakness, DM, HTN, CKD III, tobacco abuse, medical non compliance.   Clinical Impression  Pt was seen for mobility with HHA on hallway and noted his drop in control of L knee during the short walk. Pt is aware of his deficits, but with prior level being with walker and SPC per his report, he is not as strong to walk as prior to his CIR stay.  Follow acutely to work on balance and control of standing along with strength on LLE for safety and gait quality.  L knee and his LUE dense weakness are driving his struggle to walk with safer presentation.    Follow Up Recommendations CIR    Equipment Recommendations  None recommended by PT    Recommendations for Other Services Rehab consult     Precautions / Restrictions Precautions Precautions: Fall Restrictions Weight Bearing Restrictions: No      Mobility  Bed Mobility Overal bed mobility: Needs Assistance Bed Mobility: Supine to Sit     Supine to sit: Min assist     General bed mobility comments: elevation on bed with rail, PT assisting trunk    Transfers Overall transfer level: Needs assistance Equipment used: 1 person hand held assist Transfers: Sit to/from Stand Sit to Stand: Mod assist         General transfer comment: pt is fatigued from OT visit  Ambulation/Gait Ambulation/Gait assistance: Min assist;Mod assist Gait Distance (Feet): 35 Feet Assistive device: 1 person hand held assist Gait Pattern/deviations: Step-to pattern;Step-through pattern;Decreased stride length;Wide base of support;Decreased weight shift to left Gait velocity: reduced Gait velocity interpretation: <1.8 ft/sec, indicate of risk for recurrent falls General Gait Details: LLE was fatigued from  OT visit possibly, weak and buckling with gait  Stairs            Wheelchair Mobility    Modified Rankin (Stroke Patients Only)       Balance Overall balance assessment: Needs assistance Sitting-balance support: Feet supported;Bilateral upper extremity supported Sitting balance-Leahy Scale: Fair     Standing balance support: Bilateral upper extremity supported Standing balance-Leahy Scale: Poor                               Pertinent Vitals/Pain Pain Assessment: No/denies pain    Home Living Family/patient expects to be discharged to:: Private residence Living Arrangements: Parent Available Help at Discharge: Available 24 hours/day;Family Type of Home: House Home Access: Stairs to enter Entrance Stairs-Rails: None Entrance Stairs-Number of Steps: 2 Home Layout: One level Home Equipment: Environmental consultant - 2 wheels;Tub bench;Hand held shower head;Wheelchair - manual;Cane - single point;Other (comment) Additional Comments: had modification on walker for L grip    Prior Function Level of Independence: Needs assistance   Gait / Transfers Assistance Needed: walks with a walker with L walker splint or uses a w/c  ADL's / Homemaking Assistance Needed: reports independence in ADL, sits to shower on bench, mom assists with IADL.  Comments: finished 3 weeks in CIR and is in outpatient therapy at [place]: Right    Extremity/Trunk Assessment   Upper Extremity Assessment Upper Extremity Assessment: Defer to OT evaluation LUE  Deficits / Details: residual weakness, mild edema, neoprene thumb splint donned, but also has a resting hand splint at home for night use LUE Coordination: decreased fine motor;decreased gross motor    Lower Extremity Assessment Lower Extremity Assessment: LLE deficits/detail LLE Deficits / Details: residual weakness with 3 to 3+ LLE strength LLE Coordination: decreased gross motor    Cervical / Trunk  Assessment Cervical / Trunk Assessment: Normal  Communication   Communication: No difficulties  Cognition Arousal/Alertness: Awake/alert Behavior During Therapy: WFL for tasks assessed/performed Overall Cognitive Status: Within Functional Limits for tasks assessed                                        General Comments General comments (skin integrity, edema, etc.): Pt was seen for mobility to test his standing balance.  Mod assist at times to walk 22' with HHA and low control of LLE with rapid fatigue    Exercises Other Exercises Other Exercises: LLE was 3 to 3+ strength, RLE [facility]   Assessment/Plan    PT Assessment Patient needs continued PT services  PT Problem List Decreased strength;Decreased range of motion;Decreased activity tolerance;Decreased balance;Decreased mobility;Decreased coordination;Decreased knowledge of use of DME;Decreased safety awareness       PT Treatment Interventions DME instruction;Gait training;Stair training;Functional mobility training;Therapeutic activities;Therapeutic exercise;Balance training;Neuromuscular re-education;Patient/family education    PT Goals (Current goals can be found in the Care Plan section)  Acute Rehab PT Goals Patient Stated Goal: return home PT Goal Formulation: With patient Time For Goal Achievement: 06/25/20 Potential to Achieve Goals: Good    Frequency Min 3X/week   Barriers to discharge Inaccessible home environment;Decreased caregiver support home with stairs no rails to enter, home with older relatives at times    Co-evaluation               AM-PAC PT "6 Clicks" Mobility  Outcome Measure Help needed turning from your back to your side while in a flat bed without using bedrails?: A Little Help needed moving from lying on your back to sitting on the side of a flat bed without using bedrails?: A Little Help needed moving to and from a bed to a chair (including a wheelchair)?: A Little Help  needed standing up from a chair using your arms (e.g., wheelchair or bedside chair)?: A Little Help needed to walk in hospital room?: A Lot Help needed climbing 3-5 steps with a railing? : Total 6 Click Score: 15    End of Session Equipment Utilized During Treatment: Gait belt Activity Tolerance: Patient tolerated treatment well;Patient limited by fatigue;Treatment limited secondary to medical complications (Comment) Patient left: in bed;with call bell/phone within reach (nursing aware he is on side of bed eating his meal) Nurse Communication: Mobility status PT Visit Diagnosis: Unsteadiness on feet (R26.81);Muscle weakness (generalized) (M62.81);Hemiplegia and hemiparesis Hemiplegia - Right/Left: Left Hemiplegia - dominant/non-dominant: Non-dominant Hemiplegia - caused by: Unspecified    Time: 2423-5361 PT Time Calculation (min) (ACUTE ONLY): 23 min   Charges:   PT Evaluation $PT Eval Moderate Complexity: 1 Mod PT Treatments $Gait Training: 8-22 mins       Ramond Dial 06/11/2020, 12:55 PM  Mee Hives, PT MS Acute Rehab Dept. Number: [place] and [place]

## 2020-06-11 NOTE — Progress Notes (Signed)
Family Medicine Teaching Service Daily Progress Note Intern Pager: [phone]  Patient name: Tony Schmitt Medical record number: [id number] Date of birth: [birth year] Age: 49 y.o. Gender: male  Primary Care Provider: Charlott Rakes, MD Consultants: Nephrology Code Status: Full code  Pt Overview and Major Events to Date:  -11/5 patient admitted for evaluation of lower extremity edema  Assessment and Plan: Tony Schmitt is a 49 y.o. male presenting with worsening bilateral lower extremity swelling . PMH is significant for Type 2 DM, HTN and recent CVA.   Bilateral LE edema 2/2 nephrotic syndrome Patient presents with worsening edema over the last month.  BNP in the ED was 75.5, chest x-ray notable for cardiomegaly with bilateral pulmonary effusions suggestive of CHF.  Patient had echocardiogram completed 1 month ago with an EF of 60-65% which was stable from the prior echocardiogram.  Patient was also found to have hypoalbuminemia, thrombocytosis.  Gentle diuresis was initiated after admission given patient's CKD stage IV along with being Lasix nave.  Patient reports he has had not had much urine output overnight and feels that the swelling is getting worse because his legs are rubbing together. -Nephrology consulted, appreciate recommendations -Sodium restriction less than 2 g/day -Morning BMPs -Daily weights -Strict I's and O's -Continue IV Lasix -PT/OT eval and treat -Vitals per routine   CKD stage IV Creatinine on admission 3.42.  Creatinine 3.20 this morning.  Received 20 mg of Lasix IV last night.  Increase to 40 this morning.  Patient had 500 mL urine output recorded yesterday. -Nephrology consulted, appreciate recommendations -40 mg Lasix IV -Monitor BMPs -Strict I's and O's  Normocytic anemia  Thrombocytosis  Admitted with platelet count of 469, likely seems to be chronic.  This is common in patients with nephrotic syndrome.  Hgb on admission 9.7, likely due to anemia of  chronic disease.  -am CBC -continue to monitor   Type 2 DM Glucoses over the last 24 hours have ranged from 109 -161.  Reports compliance of medications and tolerating them well. Denies any hypoglycemic episodes. Home medications include glipizide 5 mg bid.  -sSSI -monitor CBGs  HTN Blood pressures over the last 24 hours have ranged from 140/80-1 67/92.  Most recent blood pressure 160/85. Home meds include amlodipine 10 mg daily, carvedilol 12.5 mg and hydralazine 25 mg tid.  -continue home meds -monitor BP, make changes as needed   History of CVA History of recent CVA in September 2021 which patient was last admitted. Prior to stroke, patient ambulated without assistance. Now patient is wheelchair bound but also uses a walker and cane to assist with ambulation. Home meds include aspirin 81 mg and atorvastatin 80 mg. -continue home meds -PT/OT eval and treat  FEN/GI: Heart healthy, carb modified, sodium restriction PPx: Lovenox   Status is: Observation  The patient remains OBS appropriate and will d/c before 2 midnights.  Dispo: The patient is from: Home              Anticipated d/c is to: Home              Anticipated d/c date is: 2 days              Patient currently is not medically stable to d/c.  Subjective:  Patient reports that he feels his swelling is getting worse.  Objective: Temp:  [98.2 F (36.8 C)-98.6 F (37 C)] 98.6 F (37 C) (11/06 0630) Pulse Rate:  [80-92] 87 (11/06 0630) Resp:  [16-18] 18 (11/06  0630) BP: (140-167)/(73-92) 167/92 (11/06 0630) SpO2:  [96 %-100 %] 96 % (11/06 0630) Weight:  [77.1 kg-77.2 kg] 77.2 kg (11/05 2300) Physical Exam: General: Well-appearing 49 year old male, no acute distress, laying comfortably in bed Cardiovascular: Regular rate and rhythm, no murmurs appreciated Respiratory: Normal work of breathing, crackles in lung bases bilaterally, occasional expiratory wheeze Abdomen: Soft, nontender, positive bowel  sounds Extremities: Pitting edema to his thighs.  Patient also has edema in left upper extremity worse than right upper extremity.  Laboratory: Recent Labs  Lab 06/07/20 1551 06/10/20 1114 06/11/20 0707  WBC 10.4 10.3 9.5  HGB 9.8* 9.7* 9.3*  HCT 30.3* 31.1* 29.2*  PLT 471* 469* 453*   Recent Labs  Lab 06/07/20 1551 06/10/20 1114 06/10/20 1320 06/11/20 0707  NA 142 143  --  144  K 4.1 3.9  --  3.6  CL 107* 110  --  112*  CO2  --  23  --  23  BUN 37* 31*  --  26*  CREATININE 3.44* 3.42*  --  3.20*  CALCIUM 9.3 9.0  --  9.2  PROT 5.6*  --  5.6*  --   BILITOT <0.2  --  0.4  --   ALKPHOS 183*  --  136*  --   ALT  --   --  39  --   AST 34  --  33  --   GLUCOSE 116* 179*  --  110*   Urinalysis -Glucose 150 -Protein greater than 300  Imaging/Diagnostic Tests: DG Chest Portable 1 View  Result Date: 06/10/2020 CLINICAL DATA:  Leg swelling. EXAM: PORTABLE CHEST 1 VIEW COMPARISON:  Chest x-ray 08/02/2018. FINDINGS: Cardiomegaly with bilateral perihilar interstitial prominence and small bilateral pleural effusions. Findings suggest CHF. Pneumonitis cannot be excluded. Low lung volumes. No pneumothorax. IMPRESSION: Cardiomegaly with bilateral perihilar interstitial prominence and small bilateral pleural effusions. Findings suggest CHF. Pneumonitis cannot be excluded. Low lung volumes. Electronically Signed   By: Marcello Moores  Register   On: 06/10/2020 14:03   VAS [place] LOWER EXTREMITY VENOUS (DVT) (ONLY [facility] & [facility])  Result Date: 06/10/2020  Lower Venous DVT Study Indications: Swelling, and Edema.  Risk Factors: None identified. Performing Technologist: Griffin Basil RCT RDMS  Examination Guidelines: A complete evaluation includes B-mode imaging, spectral Doppler, color Doppler, and power Doppler as needed of all accessible portions of each vessel. Bilateral testing is considered an integral part of a complete examination. Limited examinations for reoccurring indications may be performed as  noted. The reflux portion of the exam is performed with the patient in reverse Trendelenburg.  +---------+---------------+---------+-----------+----------+--------------+ RIGHT    CompressibilityPhasicitySpontaneityPropertiesThrombus Aging +---------+---------------+---------+-----------+----------+--------------+ CFV      Full           Yes      Yes                                 +---------+---------------+---------+-----------+----------+--------------+ SFJ      Full                                                        +---------+---------------+---------+-----------+----------+--------------+ FV Prox  Full                                                        +---------+---------------+---------+-----------+----------+--------------+  FV Mid   Full                                                        +---------+---------------+---------+-----------+----------+--------------+ FV DistalFull                                                        +---------+---------------+---------+-----------+----------+--------------+ PFV      Full                                                        +---------+---------------+---------+-----------+----------+--------------+ POP      Full           Yes      Yes                                 +---------+---------------+---------+-----------+----------+--------------+ PTV      Full                                                        +---------+---------------+---------+-----------+----------+--------------+ PERO     Full                                                        +---------+---------------+---------+-----------+----------+--------------+   +---------+---------------+---------+-----------+----------+--------------+ LEFT     CompressibilityPhasicitySpontaneityPropertiesThrombus Aging +---------+---------------+---------+-----------+----------+--------------+ CFV      Full            Yes      Yes                                 +---------+---------------+---------+-----------+----------+--------------+ SFJ      Full                                                        +---------+---------------+---------+-----------+----------+--------------+ FV Prox  Full                                                        +---------+---------------+---------+-----------+----------+--------------+ FV Mid   Full                                                        +---------+---------------+---------+-----------+----------+--------------+  FV DistalFull                                                        +---------+---------------+---------+-----------+----------+--------------+ PFV      Full                                                        +---------+---------------+---------+-----------+----------+--------------+ POP      Full           Yes      Yes                                 +---------+---------------+---------+-----------+----------+--------------+ PTV      Full                                                        +---------+---------------+---------+-----------+----------+--------------+ PERO     Full                                                        +---------+---------------+---------+-----------+----------+--------------+     Summary: BILATERAL: - No evidence of deep vein thrombosis seen in the lower extremities, bilaterally. - No evidence of superficial venous thrombosis in the lower extremities, bilaterally. -   *See table(s) above for measurements and observations.    Preliminary      Gifford Shave, MD 06/11/2020, 8:22 AM PGY-1, [place] Intern pager: [phone], text pages welcome

## 2020-06-11 NOTE — Consult Note (Addendum)
Physical Medicine and Rehabilitation Consult Reason for Consult: Debility secondary to nephrotic syndrome Referring Physician: Chrisandra Netters, MD   HPI: Tony Schmitt is a 49 y.o. male with PMH of HTN, DM2, recent CVA, CKD IV, who presented to the ED with worsening lower extremity edema that progressed over the last month. He was found to have low albumin and high urine protein and was started on IV Lasix. HTN and DM2 have been poorly controlled. 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 He ambulated Min-Mod A 35 feet today with 1 person hand held assist. Patient lives with his mother.    Review of Systems  Constitutional: Negative.   HENT: Negative.   Eyes: Negative.   Respiratory: Negative.   Cardiovascular: Positive for leg swelling.  Gastrointestinal: Negative.   Genitourinary: Negative.   Musculoskeletal: Negative.   Skin: Negative.   Neurological: Positive for weakness.  Endo/Heme/Allergies: Negative.   Psychiatric/Behavioral: Negative.    Past Medical History:  Diagnosis Date  . Diabetes mellitus without complication ([place])   . Hypertension    Past Surgical History:  Procedure Laterality Date  . BUBBLE STUDY  04/18/2020   Procedure: BUBBLE STUDY;  Surgeon: Sueanne Margarita, MD;  Location: [facility];  Service: Cardiovascular;;  . TEE WITHOUT CARDIOVERSION N/A 04/18/2020   Procedure: TRANSESOPHAGEAL ECHOCARDIOGRAM (TEE);  Surgeon: Sueanne Margarita, MD;  Location: [facility] ENDOSCOPY;  Service: Cardiovascular;  Laterality: N/A;   Family History  Problem Relation Age of Onset  . Diabetes Mother   . Diabetes Father   . Hypertension Father   . Cancer Father   . Heart failure Other    Social History:  reports that he has quit smoking. His smoking use included cigarettes and cigars. He has never used smokeless tobacco. He reports previous alcohol use. He reports  that he does not use drugs. Allergies:  Allergies  Allergen Reactions  . Latex Itching   Medications Prior to Admission  Medication Sig Dispense Refill  . acetaminophen (TYLENOL) 325 MG tablet Take 2 tablets (650 mg total) by mouth every 4 (four) hours as needed for mild pain (or temp > 37.5 C (99.5 F)).    Marland Kitchen amLODipine (NORVASC) 10 MG tablet Take 1 tablet (10 mg total) by mouth daily. 30 tablet 3  . aspirin 81 MG chewable tablet Chew 1 tablet (81 mg total) by mouth daily. (Patient taking differently: Chew 81 mg by mouth at bedtime. ) 30 tablet 0  . atorvastatin (LIPITOR) 80 MG tablet Take 1 tablet (80 mg total) by mouth at bedtime. 30 tablet 3  . carvedilol (COREG) 12.5 MG tablet Take 1 tablet (12.5 mg total) by mouth 2 (two) times daily with a meal. 60 tablet 3  . glipiZIDE (GLUCOTROL) 5 MG tablet Take 1 tablet (5 mg total) by mouth 2 (two) times daily before a meal. 60 tablet 3  . hydrALAZINE (APRESOLINE) 25 MG tablet Take 1 tablet (25 mg total) by mouth 3 (three) times daily. 90 tablet 3  . omeprazole (PRILOSEC) 40 MG capsule Take 1 capsule (40 mg total) by mouth daily. 30 capsule 3  . polyethylene glycol (MIRALAX / GLYCOLAX) 17 g packet Take 17 g by mouth 2 (two) times daily. (Patient taking differently: Take 17 g by mouth daily as needed for mild constipation (MIX AND DRINK). ) 14 each 0  . sodium bicarbonate 650 MG tablet Take 2 tablets (1,300 mg total)  by mouth 2 (two) times daily. (Patient taking differently: Take 1,300 mg by mouth See admin instructions. Take 1,300 mg by mouth at lunchtime and 5 PM) 120 tablet 0  . Vitamin D, Ergocalciferol, (DRISDOL) 1.25 MG (50000 UNIT) CAPS capsule Take 1 capsule (50,000 Units total) by mouth every 7 (seven) days. (Patient taking differently: Take 50,000 Units by mouth every Wednesday. ) 4 capsule 2  . senna-docusate (SENOKOT-S) 8.6-50 MG tablet Take 2 tablets by mouth at bedtime. (Patient not taking: Reported on 06/10/2020)      Home: Home  Living Family/patient expects to be discharged to:: Private residence Living Arrangements: Parent Available Help at Discharge: Available 24 hours/day, Family Type of Home: House Home Access: Stairs to enter Technical brewer of Steps: 2 Entrance Stairs-Rails: None Home Layout: One level Bathroom Shower/Tub: Tub/shower unit, Architectural technologist: Standard Home Equipment: Environmental consultant - 2 wheels, Tub bench, Hand held shower head, Wheelchair - manual, Cane - single point, Other (comment) Additional Comments: had modification on walker for L grip  Functional History: Prior Function Level of Independence: Needs assistance Gait / Transfers Assistance Needed: walks with a walker with L walker splint or uses a w/c ADL's / Homemaking Assistance Needed: reports independence in ADL, sits to shower on bench, mom assists with IADL. Comments: finished 3 weeks in CIR and is in outpatient therapy at [place]:  Mobility: Bed Mobility Overal bed mobility: Needs Assistance Bed Mobility: Supine to Sit Supine to sit: Min assist General bed mobility comments: elevation on bed with rail, PT assisting trunk Transfers Overall transfer level: Needs assistance Equipment used: 1 person hand held assist Transfers: Sit to/from Stand Sit to Stand: Mod assist General transfer comment: pt is fatigued from OT visit Ambulation/Gait Ambulation/Gait assistance: Min assist, Mod assist Gait Distance (Feet): 35 Feet Assistive device: 1 person hand held assist Gait Pattern/deviations: Step-to pattern, Step-through pattern, Decreased stride length, Wide base of support, Decreased weight shift to left General Gait Details: LLE was fatigued from OT visit possibly, weak and buckling with gait Gait velocity: reduced Gait velocity interpretation: <1.8 ft/sec, indicate of risk for recurrent falls    ADL: ADL Overall ADL's : Needs assistance/impaired Eating/Feeding: Set up, Sitting Grooming: Set up,  Sitting Upper Body Bathing: Minimal assistance, Sitting Lower Body Bathing: Maximal assistance, Sit to/from stand Upper Body Dressing : Minimal assistance, Sitting Lower Body Dressing: Maximal assistance, Sit to/from stand Toilet Transfer: Minimal assistance, Stand-pivot, RW Toileting- Clothing Manipulation and Hygiene: Maximal assistance, Sit to/from stand Functional mobility during ADLs: Minimal assistance, Rolling walker  Cognition: Cognition Overall Cognitive Status: Within Functional Limits for tasks assessed Orientation Level: Oriented X4 Cognition Arousal/Alertness: Awake/alert Behavior During Therapy: WFL for tasks assessed/performed Overall Cognitive Status: Within Functional Limits for tasks assessed  Blood pressure (!) 151/77, pulse 84, temperature 99.3 F (37.4 C), temperature source Oral, resp. rate 18, height 5\' 4"  (1.626 m), weight 77.2 kg, SpO2 96 %. Physical Exam   General: Alert and oriented x 3, No apparent distress HEENT: Head is normocephalic, atraumatic, PERRLA, EOMI, sclera anicteric, oral mucosa pink and moist, dentition intact, ext ear canals clear,  Neck: Supple without JVD or lymphadenopathy Heart: Reg rate and rhythm. No murmurs rubs or gallops Chest: +expiratory wheeze, otherwise breathing comfortably Abdomen: Soft, non-tender, non-distended, bowel sounds positive. Extremities: Pitting edema in all extremities, worst on left side Skin: Clean and intact without signs of breakdown Neuro: Pt is cognitively appropriate with normal insight, memory, and awareness. Cranial nerves 2-12 are intact. +left UE hemiplegia:  0/5 throughout except for 1/5 SA. Difficulty raising lower extremities as well due to edema, L>R.  Psych: Pt's affect is appropriate. Pt is cooperative   Results for orders placed or performed during the hospital encounter of 06/10/20 (from the past 24 hour(s))  Glucose, capillary     Status: Abnormal   Collection Time: 06/10/20 10:08 PM  Result  Value Ref Range   Glucose-Capillary 161 (H) 70 - 99 mg/dL  Glucose, capillary     Status: Abnormal   Collection Time: 06/11/20  6:56 AM  Result Value Ref Range   Glucose-Capillary 109 (H) 70 - 99 mg/dL  HIV Antibody (routine testing w rflx)     Status: None   Collection Time: 06/11/20  7:07 AM  Result Value Ref Range   HIV Screen 4th Generation wRfx Non Reactive Non Reactive  Basic metabolic panel     Status: Abnormal   Collection Time: 06/11/20  7:07 AM  Result Value Ref Range   Sodium 144 135 - 145 mmol/L   Potassium 3.6 3.5 - 5.1 mmol/L   Chloride 112 (H) 98 - 111 mmol/L   CO2 23 22 - 32 mmol/L   Glucose, Bld 110 (H) 70 - 99 mg/dL   BUN 26 (H) 6 - 20 mg/dL   Creatinine, Ser 3.20 (H) 0.61 - 1.24 mg/dL   Calcium 9.2 8.9 - 10.3 mg/dL   GFR, Estimated 23 (L) >60 mL/min   Anion gap 9 5 - 15  Magnesium     Status: Abnormal   Collection Time: 06/11/20  7:07 AM  Result Value Ref Range   Magnesium 1.5 (L) 1.7 - 2.4 mg/dL  CBC with Differential/Platelet     Status: Abnormal   Collection Time: 06/11/20  7:07 AM  Result Value Ref Range   WBC 9.5 4.0 - 10.5 K/uL   RBC 3.40 (L) 4.22 - 5.81 MIL/uL   Hemoglobin 9.3 (L) 13.0 - 17.0 g/dL   HCT 29.2 (L) 39 - 52 %   MCV 85.9 80.0 - 100.0 fL   MCH 27.4 26.0 - 34.0 pg   MCHC 31.8 30.0 - 36.0 g/dL   RDW 15.0 11.5 - 15.5 %   Platelets 453 (H) 150 - 400 K/uL   nRBC 0.0 0.0 - 0.2 %   Neutrophils Relative % 64 %   Neutro Abs 6.1 1.7 - 7.7 K/uL   Lymphocytes Relative 24 %   Lymphs Abs 2.2 0.7 - 4.0 K/uL   Monocytes Relative 8 %   Monocytes Absolute 0.8 0.1 - 1.0 K/uL   Eosinophils Relative 3 %   Eosinophils Absolute 0.2 0.0 - 0.5 K/uL   Basophils Relative 1 %   Basophils Absolute 0.1 0.0 - 0.1 K/uL   Immature Granulocytes 0 %   Abs Immature Granulocytes 0.03 0.00 - 0.07 K/uL  Glucose, capillary     Status: Abnormal   Collection Time: 06/11/20 11:26 AM  Result Value Ref Range   Glucose-Capillary 117 (H) 70 - 99 mg/dL  Glucose, capillary      Status: Abnormal   Collection Time: 06/11/20  4:38 PM  Result Value Ref Range   Glucose-Capillary 159 (H) 70 - 99 mg/dL   DG Chest Portable 1 View  Result Date: 06/10/2020 CLINICAL DATA:  Leg swelling. EXAM: PORTABLE CHEST 1 VIEW COMPARISON:  Chest x-ray 08/02/2018. FINDINGS: Cardiomegaly with bilateral perihilar interstitial prominence and small bilateral pleural effusions. Findings suggest CHF. Pneumonitis cannot be excluded. Low lung volumes. No pneumothorax. IMPRESSION: Cardiomegaly with bilateral perihilar interstitial prominence  and small bilateral pleural effusions. Findings suggest CHF. Pneumonitis cannot be excluded. Low lung volumes. Electronically Signed   By: Marcello Moores  Register   On: 06/10/2020 14:03   VAS [place] LOWER EXTREMITY VENOUS (DVT) (ONLY [facility] & [facility])  Result Date: 06/11/2020  Lower Venous DVT Study Indications: Swelling, and Edema.  Risk Factors: None identified. Performing Technologist: Griffin Basil RCT RDMS  Examination Guidelines: A complete evaluation includes B-mode imaging, spectral Doppler, color Doppler, and power Doppler as needed of all accessible portions of each vessel. Bilateral testing is considered an integral part of a complete examination. Limited examinations for reoccurring indications may be performed as noted. The reflux portion of the exam is performed with the patient in reverse Trendelenburg.  +---------+---------------+---------+-----------+----------+--------------+ RIGHT    CompressibilityPhasicitySpontaneityPropertiesThrombus Aging +---------+---------------+---------+-----------+----------+--------------+ CFV      Full           Yes      Yes                                 +---------+---------------+---------+-----------+----------+--------------+ SFJ      Full                                                        +---------+---------------+---------+-----------+----------+--------------+ FV Prox  Full                                                         +---------+---------------+---------+-----------+----------+--------------+ FV Mid   Full                                                        +---------+---------------+---------+-----------+----------+--------------+ FV DistalFull                                                        +---------+---------------+---------+-----------+----------+--------------+ PFV      Full                                                        +---------+---------------+---------+-----------+----------+--------------+ POP      Full           Yes      Yes                                 +---------+---------------+---------+-----------+----------+--------------+ PTV      Full                                                        +---------+---------------+---------+-----------+----------+--------------+  PERO     Full                                                        +---------+---------------+---------+-----------+----------+--------------+   +---------+---------------+---------+-----------+----------+--------------+ LEFT     CompressibilityPhasicitySpontaneityPropertiesThrombus Aging +---------+---------------+---------+-----------+----------+--------------+ CFV      Full           Yes      Yes                                 +---------+---------------+---------+-----------+----------+--------------+ SFJ      Full                                                        +---------+---------------+---------+-----------+----------+--------------+ FV Prox  Full                                                        +---------+---------------+---------+-----------+----------+--------------+ FV Mid   Full                                                        +---------+---------------+---------+-----------+----------+--------------+ FV DistalFull                                                         +---------+---------------+---------+-----------+----------+--------------+ PFV      Full                                                        +---------+---------------+---------+-----------+----------+--------------+ POP      Full           Yes      Yes                                 +---------+---------------+---------+-----------+----------+--------------+ PTV      Full                                                        +---------+---------------+---------+-----------+----------+--------------+ PERO     Full                                                        +---------+---------------+---------+-----------+----------+--------------+  Summary: BILATERAL: - No evidence of deep vein thrombosis seen in the lower extremities, bilaterally. - No evidence of superficial venous thrombosis in the lower extremities, bilaterally. -   *See table(s) above for measurements and observations. Electronically signed by Servando Snare MD on 06/11/2020 at 3:30:54 PM.    Final      Assessment/Plan: Diagnosis: Debility secondary to nephrotic syndrome 1. Does the need for close, 24 hr/day medical supervision in concert with the patient's rehab needs make it unreasonable for this patient to be served in a less intensive setting? Yes 2. 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 Due to bladder management, bowel management, safety, skin/wound care, disease management, medication administration, pain management and patient education, does the patient require 24 hr/day rehab nursing? Yes 4. Does the patient require coordinated care of a physician, rehab nurse, therapy disciplines of PT, OT to address physical and functional deficits in the context of the above medical diagnosis(es)? Yes Addressing deficits in the following areas: balance, endurance,  locomotion, strength, transferring, bowel/bladder control, bathing, dressing, feeding, grooming, toileting and psychosocial support 5. Can the patient actively participate in an intensive therapy program of at least 3 hrs of therapy per day at least 5 days per week? Yes 6. The potential for patient to make measurable gains while on inpatient rehab is excellent 7. Anticipated functional outcomes upon discharge from inpatient rehab are modified independent  with PT, modified independent with OT, independent with SLP. 8. Estimated rehab length of stay to reach the above functional goals is: 10-12 days 9. Anticipated discharge destination: Home 10. Overall Rehab/Functional Prognosis: excellent  RECOMMENDATIONS: This patient's condition is appropriate for continued rehabilitative care in the following setting: CIR Patient has agreed to participate in recommended program. Yes Note that insurance prior authorization may be required for reimbursement for recommended care.  Comment: Mr. Briseno would be an excellent CIR candidate. He is currently requiring min-Mod A with ambulation and max assist with lower body dressing due to his acute edema from nephrotic syndrome and deficits from prior CVA. He lives with his mother. He is highly motivated.   [name], MD 06/11/2020

## 2020-06-12 DIAGNOSIS — N179 Acute kidney failure, unspecified: Principal | ICD-10-CM

## 2020-06-12 DIAGNOSIS — R609 Edema, unspecified: Secondary | ICD-10-CM

## 2020-06-12 DIAGNOSIS — R600 Localized edema: Secondary | ICD-10-CM

## 2020-06-12 LAB — RENAL FUNCTION PANEL
Albumin: 1.6 g/dL — ABNORMAL LOW (ref 3.5–5.0)
Anion gap: 8 (ref 5–15)
BUN: 26 mg/dL — ABNORMAL HIGH (ref 6–20)
CO2: 24 mmol/L (ref 22–32)
Calcium: 9.2 mg/dL (ref 8.9–10.3)
Chloride: 110 mmol/L (ref 98–111)
Creatinine, Ser: 3.37 mg/dL — ABNORMAL HIGH (ref 0.61–1.24)
GFR, Estimated: 21 mL/min — ABNORMAL LOW (ref >60)
Glucose, Bld: 149 mg/dL — ABNORMAL HIGH (ref 70–99)
Phosphorus: 3.7 mg/dL (ref 2.5–4.6)
Potassium: 3.6 mmol/L (ref 3.5–5.1)
Sodium: 142 mmol/L (ref 135–145)

## 2020-06-12 LAB — GLUCOSE, CAPILLARY
Glucose-Capillary: 126 mg/dL — ABNORMAL HIGH (ref 70–99)
Glucose-Capillary: 135 mg/dL — ABNORMAL HIGH (ref 70–99)
Glucose-Capillary: 155 mg/dL — ABNORMAL HIGH (ref 70–99)
Glucose-Capillary: 177 mg/dL — ABNORMAL HIGH (ref 70–99)

## 2020-06-12 MED ORDER — POLYETHYLENE GLYCOL 3350 17 G PO PACK
17.0000 g | PACK | Freq: Two times a day (BID) | ORAL | Status: DC
  Administered 2020-06-12 – 2020-06-14 (×4): 17 g via ORAL
  Filled 2020-06-12 (×4): qty 1

## 2020-06-12 NOTE — Progress Notes (Signed)
   06/11/20 2341  Provider Notification  Provider Name/Title Dr. Wray Kearns  Date Provider Notified 06/11/20  Time Provider Notified 2341  Notification Type Page  Notification Reason Requested by patient/family (Would like to start laxative tongiht instead of am.)  Response See new orders  Date of Provider Response 06/12/20   Last BM is 06/09/2020.  At beginning of shift, MD paged per patient's request for order for Miralax.  MD assessed patient and patient did not want to start Miralax until the morning.  As night progressed, patient felt like he needed to have a bowel movement and was unable to do so.  He is passing gas.  Abdomen is slightly distended but soft.  Positive bowel sounds to all four quadrants.  He would like to start Miralax tonight.  Dr. Wray Kearns paged and made aware.  Order received for Miralax to start tonight.  Medication given.  Will continue to monitor patient for relief of constipation.  Earleen Reaper RN

## 2020-06-12 NOTE — Progress Notes (Signed)
Received page from patient's nurse that patient was requesting a laxative to help him have a bowel movement. Went to evaluate patient. Found patient resting comfortably in bed in no distress. Patient states his last bowel movement was Friday morning and that he'd like something to help. Patient endorses he is still passing gas. Discussed miralax with patient, initially recommended twice per day dosing but patient requested to start with once per day. Patient also initially requested holding off on it until the morning but then decided he'd prefer to have it tonight as he felt like he could have a bowel movement.  Physical exam: General: Alert and oriented in no apparent distress Lungs: Np respiratory distress Abdomen: Bowel sounds present, no abdominal pain  Assessment/Plan 49yo male with constipation and last bowel movement Friday morning, no physical exam findings concerning for obstruction, patient is still passing gas. - Miralax ordered daily - If not effective can consider BID dosing or enema

## 2020-06-12 NOTE — Progress Notes (Signed)
Family Medicine Teaching Service Daily Progress Note Intern Pager: [phone]  Patient name: Tony Schmitt Medical record number: [id number] Date of birth: 05/21/1971 Age: 49 y.o. Gender: male  Primary Care Provider: Charlott Rakes, MD Consultants: Nephrology Code Status: Full code  Pt Overview and Major Events to Date:  -11/5 patient admitted for evaluation of lower extremity edema  Assessment and Plan: Tony Schmitt is a 50 y.o. male presenting with worsening bilateral lower extremity swelling . PMH is significant for Type 2 DM, HTN and recent CVA.   Bilateral LE edema 2/2 CKD stage IV with nephrotic syndrome Nephrology consulted yesterday and recommended increasing Lasix to 120 mg twice daily.  They did not recommend biopsy because they feel it would not help with the CKD stage IV.  I also recommended hepatitis B and C labs which came back negative.  Overnight patient did well and over the last 24 hours he has put out 2.3 L of urine with a net negative of 1.2 L. -Nephrology consulted, appreciate recommendations -Continue Lasix 120 mg twice daily -Morning BMPs -Daily weights -Strict I's and O's -Continue IV Lasix -PT/OT recommended CIR -CIR consulted and have approved admission for inpatient rehab, -Social work contacted patient and he would prefer continued outpatient physical therapy with [facility].  This will be planned once stable for discharge -Vitals per routine   Normocytic anemia  Thrombocytosis  Admitted with platelet count of 469, likely seems to be chronic.  This is common in patients with nephrotic syndrome.  Hgb on admission 9.7, likely due to anemia of chronic disease.  -am CBC -continue to monitor   Type 2 DM Glucoses over the last 24 hours have ranged from 109 -161.  Reports compliance of medications and tolerating them well. Denies any hypoglycemic episodes. Home medications include glipizide 5 mg bid.  -sSSI -monitor CBGs  HTN Blood pressures  over the last 24 hours have ranged from 140/80-1 67/92.  Most recent blood pressure 160/85. Home meds include amlodipine 10 mg daily, carvedilol 12.5 mg and hydralazine 25 mg tid.  -continue home meds -monitor BP, make changes as needed   History of CVA History of recent CVA in September 2021 which patient was last admitted. Prior to stroke, patient ambulated without assistance. Now patient is wheelchair bound but also uses a walker and cane to assist with ambulation. Home meds include aspirin 81 mg and atorvastatin 80 mg. -continue home meds -PT/OT eval and treat  FEN/GI: Heart healthy, carb modified, sodium restriction PPx: Lovenox   Status is: Observation  The patient remains OBS appropriate and will d/c before 2 midnights.  Dispo: The patient is from: Home              Anticipated d/c is to: Home              Anticipated d/c date is: 2 days              Patient currently is not medically stable to d/c.  Subjective:  Reports the swelling is getting better and that he has been urinating a lot.  Would like to have a bowel movement.  Objective: Temp:  [98.6 F (37 C)-99.3 F (37.4 C)] 98.6 F (37 C) (11/07 0407) Pulse Rate:  [83-87] 83 (11/07 0407) Resp:  [18] 18 (11/07 0407) BP: (145-160)/(76-85) 149/82 (11/07 0407) SpO2:  [96 %] 96 % (11/07 0407) Weight:  [85.6 kg] 85.6 kg (11/07 0500) Physical Exam: General: Well-appearing 49 year old male, no acute distress, sitting comfortably  Cardiovascular: Regular rate and rhythm, no murmurs appreciated Respiratory: Normal work of breathing, crackles have improved since previous exam, breathing improved as well Abdomen: Soft, nontender, positive bowel sounds Extremities: Edema still present but improving in lower extremities bilaterally as well as abdomen  Laboratory: Recent Labs  Lab 06/07/20 1551 06/10/20 1114 06/11/20 0707  WBC 10.4 10.3 9.5  HGB 9.8* 9.7* 9.3*  HCT 30.3* 31.1* 29.2*  PLT 471* 469* 453*   Recent Labs   Lab 06/07/20 1551 06/07/20 1551 06/10/20 1114 06/10/20 1320 06/11/20 0707 06/12/20 0348  NA 142  --  143  --  144 142  K 4.1   < > 3.9  --  3.6 3.6  CL 107*   < > 110  --  112* 110  CO2  --   --  23  --  23 24  BUN 37*  --  31*  --  26* 26*  CREATININE 3.44*   < > 3.42*  --  3.20* 3.37*  CALCIUM 9.3   < > 9.0  --  9.2 9.2  PROT 5.6*  --   --  5.6*  --   --   BILITOT <0.2  --   --  0.4  --   --   ALKPHOS 183*  --   --  136*  --   --   ALT  --   --   --  39  --   --   AST 34  --   --  33  --   --   GLUCOSE 116*  --  179*  --  110* 149*   < > = values in this interval not displayed.   Urinalysis -Glucose 150 -Protein greater than 300  Imaging/Diagnostic Tests: DG Chest Portable 1 View  Result Date: 06/10/2020 CLINICAL DATA:  Leg swelling. EXAM: PORTABLE CHEST 1 VIEW COMPARISON:  Chest x-ray 08/02/2018. FINDINGS: Cardiomegaly with bilateral perihilar interstitial prominence and small bilateral pleural effusions. Findings suggest CHF. Pneumonitis cannot be excluded. Low lung volumes. No pneumothorax. IMPRESSION: Cardiomegaly with bilateral perihilar interstitial prominence and small bilateral pleural effusions. Findings suggest CHF. Pneumonitis cannot be excluded. Low lung volumes. Electronically Signed   By: Marcello Moores  Register   On: 06/10/2020 14:03   VAS [place] LOWER EXTREMITY VENOUS (DVT) (ONLY [facility] & [facility])  Result Date: 06/11/2020  Lower Venous DVT Study Indications: Swelling, and Edema.  Risk Factors: None identified. Performing Technologist: Griffin Basil RCT RDMS  Examination Guidelines: A complete evaluation includes B-mode imaging, spectral Doppler, color Doppler, and power Doppler as needed of all accessible portions of each vessel. Bilateral testing is considered an integral part of a complete examination. Limited examinations for reoccurring indications may be performed as noted. The reflux portion of the exam is performed with the patient in reverse Trendelenburg.   +---------+---------------+---------+-----------+----------+--------------+ RIGHT    CompressibilityPhasicitySpontaneityPropertiesThrombus Aging +---------+---------------+---------+-----------+----------+--------------+ CFV      Full           Yes      Yes                                 +---------+---------------+---------+-----------+----------+--------------+ SFJ      Full                                                        +---------+---------------+---------+-----------+----------+--------------+  FV Prox  Full                                                        +---------+---------------+---------+-----------+----------+--------------+ FV Mid   Full                                                        +---------+---------------+---------+-----------+----------+--------------+ FV DistalFull                                                        +---------+---------------+---------+-----------+----------+--------------+ PFV      Full                                                        +---------+---------------+---------+-----------+----------+--------------+ POP      Full           Yes      Yes                                 +---------+---------------+---------+-----------+----------+--------------+ PTV      Full                                                        +---------+---------------+---------+-----------+----------+--------------+ PERO     Full                                                        +---------+---------------+---------+-----------+----------+--------------+   +---------+---------------+---------+-----------+----------+--------------+ LEFT     CompressibilityPhasicitySpontaneityPropertiesThrombus Aging +---------+---------------+---------+-----------+----------+--------------+ CFV      Full           Yes      Yes                                  +---------+---------------+---------+-----------+----------+--------------+ SFJ      Full                                                        +---------+---------------+---------+-----------+----------+--------------+ FV Prox  Full                                                        +---------+---------------+---------+-----------+----------+--------------+  FV Mid   Full                                                        +---------+---------------+---------+-----------+----------+--------------+ FV DistalFull                                                        +---------+---------------+---------+-----------+----------+--------------+ PFV      Full                                                        +---------+---------------+---------+-----------+----------+--------------+ POP      Full           Yes      Yes                                 +---------+---------------+---------+-----------+----------+--------------+ PTV      Full                                                        +---------+---------------+---------+-----------+----------+--------------+ PERO     Full                                                        +---------+---------------+---------+-----------+----------+--------------+     Summary: BILATERAL: - No evidence of deep vein thrombosis seen in the lower extremities, bilaterally. - No evidence of superficial venous thrombosis in the lower extremities, bilaterally. -   *See table(s) above for measurements and observations. Electronically signed by Servando Snare MD on 06/11/2020 at 3:30:54 PM.    Final      Gifford Shave, MD 06/12/2020, 9:07 AM PGY-2, [place] Intern pager: [phone], text pages welcome

## 2020-06-12 NOTE — Progress Notes (Signed)
[facility] Progress Note  Subjective: 1.3 L uop yest and 1100 today so far w/ IV lasix. BP's high-normal, HR 80's. Creat stable 3.3   Vitals:   06/11/20 1639 06/11/20 2044 06/12/20 0407 06/12/20 0500  BP: (!) 151/77 (!) 145/76 (!) 149/82   Pulse: 84 84 83   Resp: '18 18 18   ' Temp: 99.3 F (37.4 C) 99 F (37.2 C) 98.6 F (37 C)   TempSrc: Oral Oral Oral   SpO2: 96% 96% 96%   Weight:  85.6 kg  85.6 kg  Height:        Exam: Gen pt on RA at 96% sats No rash, cyanosis or gangrene Sclera anicteric, throat clear  No jvd or bruits Chest bilat basilar rales, bilat chest wall edema RRR no MRG Abd soft ntnd no mass or ascites +bs GU normal male MS no joint effusions or deformity Ext diffuse 2-3+ tight chronic bilat LE edema from feet to abd/ chest wall Neuro is alert, Ox 3 , no asterixis, L sided hemiparesis prior cva    Home meds:  - norvasc 10/ coreg 12.5 bid/ hydralazine 25 tid  - lipitor qd/ asa 81  - sod bicarb 2 bid/ prilosec 40  - glucotrol 5 bid  - prn's/ vitamins/ supplements     Date              Creat               eGFR (ml/min)    2019             1.9- 2.4    2020             AKI 3.3 > 1.6 / AKI 2.1 > 1.73    Jan 2021      1.84    Sept 2021     2.70- 3.50    Nov 2            3.44         Nov 5            3.42    Nov 6            3.20    UA 11/05 - clear , no bact, 0-5 rbc/ wbc, prot > 300     UNA - 94,  UCr 87      RUS - 11.6/ 10.9 cm kidneys w/o hydro      CXR 11/5 - IMPRESSION: Cardiomegaly with bilateral perihilar interstitial prominence and small bilateral pleural effusions. Findings suggest CHF. Pneumonitis cannot be excluded. Low lung volumes.    Last echo - sept 2021 - LVEF 60-65%, no sig valve disease, no LVH, no diastolic issues    Assessment/ Plan: 1. CKD 4 - b/l creat 2.7- 3.2 from Sept 2021, eGFR 21- 26 ml/min.  BP's high, marked volume overload on exam, pulm edema. Last echo normal LVEF no diast changes. UA neg here except  for protein, renal US w/o obstruction. Pt was seen by renal in Sept here.  Pt has neph syndrome (UPC ratio 12 in Sept) w/ very low albumin 2.0 c/w diabetic and/or other glom dz.  Not sure biopsy would help w/ CKD 4, will d/w colleagues. Ordered hep B/C and a few other serologies. Creat near baseline w/ sig anasarca, will cont diuresis (IV lasix 120 mg bid) for now.  2. DM2 - poorly controlled 3. HTN - poorly controlled 4. H/o CVA - w/ left hemiparesis , from Sept 2021  5. Anemia ckd / other - Hb 9's      Rob Izeyah Deike 06/12/2020, 6:34 AM   Recent Labs  Lab 06/10/20 1114 06/10/20 1114 06/11/20 0707 06/12/20 0348  K 3.9   < > 3.6 3.6  BUN 31*   < > 26* 26*  CREATININE 3.42*   < > 3.20* 3.37*  CALCIUM 9.0   < > 9.2 9.2  PHOS  --   --   --  3.7  HGB 9.7*  --  9.3*  --    < > = values in this interval not displayed.   Inpatient medications:  amLODipine  10 mg Oral Daily   aspirin  81 mg Oral Daily   atorvastatin  80 mg Oral QHS   carvedilol  12.5 mg Oral BID WC   enoxaparin (LOVENOX) injection  30 mg Subcutaneous Q24H   hydrALAZINE  25 mg Oral TID   insulin aspart  0-9 Units Subcutaneous TID WC   pantoprazole  40 mg Oral Daily   polyethylene glycol  17 g Oral Daily    furosemide 120 mg (06/12/20 0553)   acetaminophen

## 2020-06-12 NOTE — Plan of Care (Signed)
°  Problem: Elimination: °Goal: Will not experience complications related to urinary retention °Outcome: Progressing °  °Problem: Elimination: °Goal: Will not experience complications related to bowel motility °Outcome: Not Progressing °  °

## 2020-06-12 NOTE — Progress Notes (Signed)
Occupational Therapy Treatment Patient Details Name: Tony Schmitt MRN: [id number] DOB: [birth year] Today's Date: 06/12/2020    History of present illness Pt is a 49 year old man admitted with B LE edema and AKI on CKD. PMH: recent CVA with residual L side weakness, DM, HTN, CKD III, tobacco abuse, medical non compliance.    OT comments  OT treatment session with focus on bed mobility, functional transfers, and self-care re-education. Patient currently functioning slightly below baseline level of function limited 2/2 swelling in BLE increasing need for assistance with LB BADLs. Patient found to be incontinent of urine this date requiring Mod A for LB bathing, threading new brief, and to hike brief over hips in standing. Min A functional transfers with hand over hand assist to maintain position of LUE on RW without orthosis. Patient would benefit from continued acute OT services to maximize safety and independence with self-care tasks in prep for safe d/c home with 24hr supervision/assist. Continued recommendation for OPOT for LUE NMR, edema management, ADL transfers, and safe household/community mobility with use of AD.    Follow Up Recommendations  Outpatient OT    Equipment Recommendations  None recommended by OT    Recommendations for Other Services      Precautions / Restrictions Precautions Precautions: Fall Restrictions Weight Bearing Restrictions: No       Mobility Bed Mobility Overal bed mobility: Needs Assistance Bed Mobility: Supine to Sit     Supine to sit: Min assist     General bed mobility comments: HOB elevated, use of bedrail and increased time.   Transfers Overall transfer level: Needs assistance Equipment used: Rolling walker (2 wheeled) (Hand held assist to maintain position of LUE on RW) Transfers: Sit to/from Stand Sit to Stand: Min assist         General transfer comment: Hand over hand assist to maintain position of LUE on RW and occasional cues  for L knee extension.     Balance     Sitting balance-Leahy Scale: Fair       Standing balance-Leahy Scale: Poor                             ADL either performed or assessed with clinical judgement   ADL                       Lower Body Dressing: Moderate assistance;Sitting/lateral leans;Sit to/from stand   Toilet Transfer: Minimal assistance;Stand-pivot;RW   Toileting- Clothing Manipulation and Hygiene: Minimal assistance;Sit to/from stand       Functional mobility during ADLs: Minimal assistance;Rolling walker       Vision       Perception     Praxis      Cognition Arousal/Alertness: Awake/alert Behavior During Therapy: WFL for tasks assessed/performed Overall Cognitive Status: Within Functional Limits for tasks assessed                                          Exercises     Shoulder Instructions       General Comments      Pertinent Vitals/ Pain       Pain Assessment: No/denies pain  Home Living  Prior Functioning/Environment              Frequency  Min 2X/week        Progress Toward Goals  OT Goals(current goals can now be found in the care plan section)  Progress towards OT goals: Progressing toward goals  Acute Rehab OT Goals Patient Stated Goal: return home OT Goal Formulation: With patient Time For Goal Achievement: 06/25/20 Potential to Achieve Goals: Good ADL Goals Pt Will Perform Grooming: standing;with supervision Pt Will Perform Upper Body Bathing: with set-up;sitting Pt Will Perform Lower Body Bathing: with supervision;sitting/lateral leans Pt Will Perform Upper Body Dressing: with set-up;sitting Pt Will Perform Lower Body Dressing: with supervision;sit to/from stand Pt Will Transfer to Toilet: with supervision;ambulating;regular height toilet Pt Will Perform Toileting - Clothing Manipulation and hygiene: with  supervision;sit to/from stand Pt/caregiver will Perform Home Exercise Program: Increased ROM;Left upper extremity;Independently  Plan Discharge plan remains appropriate    Co-evaluation                 AM-PAC OT "6 Clicks" Daily Activity     Outcome Measure   Help from another person eating meals?: A Little Help from another person taking care of personal grooming?: A Little Help from another person toileting, which includes using toliet, bedpan, or urinal?: A Lot Help from another person bathing (including washing, rinsing, drying)?: A Lot Help from another person to put on and taking off regular upper body clothing?: A Little Help from another person to put on and taking off regular lower body clothing?: A Lot 6 Click Score: 15    End of Session Equipment Utilized During Treatment: Gait belt;Rolling walker  OT Visit Diagnosis: Unsteadiness on feet (R26.81);Other abnormalities of gait and mobility (R26.89);Hemiplegia and hemiparesis Hemiplegia - Right/Left: Left Hemiplegia - dominant/non-dominant: Non-Dominant Hemiplegia - caused by: Cerebral infarction   Activity Tolerance Patient tolerated treatment well   Patient Left in chair;with call bell/phone within reach;with bed alarm set   Nurse Communication          Time: 8938-1017 OT Time Calculation (min): 29 min  Charges: OT General Charges $OT Visit: 1 Visit OT Treatments $Self Care/Home Management : 23-37 mins  Belton Peplinski H. OTR/L Supplemental OT, Department of rehab services [phone]   Darling Cieslewicz R H. 06/12/2020, 11:27 AM

## 2020-06-12 NOTE — Progress Notes (Signed)
Inpatient Rehab Admissions:  Inpatient Rehab Consult received.  I met with patient at the bedside for rehabilitation assessment and to discuss goals and expectations of an inpatient rehab admission.  Pt familiar with goals and expectations as he was recently a pt at [facility] in September.  Pt stated that he would prefer to go home and continue outpatient therapy at [facility] instead of receiving therapy at [facility].  TOC made aware.  Will sign off.  Signed: Gayland Curry, [place], [place] Admissions Coordinator [phone]

## 2020-06-13 ENCOUNTER — Telehealth: Payer: Self-pay | Admitting: *Deleted

## 2020-06-13 DIAGNOSIS — N049 Nephrotic syndrome with unspecified morphologic changes: Secondary | ICD-10-CM

## 2020-06-13 LAB — RENAL FUNCTION PANEL
Albumin: 1.8 g/dL — ABNORMAL LOW (ref 3.5–5.0)
Anion gap: 8 (ref 5–15)
BUN: 30 mg/dL — ABNORMAL HIGH (ref 6–20)
CO2: 25 mmol/L (ref 22–32)
Calcium: 9.4 mg/dL (ref 8.9–10.3)
Chloride: 107 mmol/L (ref 98–111)
Creatinine, Ser: 3.49 mg/dL — ABNORMAL HIGH (ref 0.61–1.24)
GFR, Estimated: 20 mL/min — ABNORMAL LOW (ref >60)
Glucose, Bld: 162 mg/dL — ABNORMAL HIGH (ref 70–99)
Phosphorus: 4.1 mg/dL (ref 2.5–4.6)
Potassium: 3.8 mmol/L (ref 3.5–5.1)
Sodium: 140 mmol/L (ref 135–145)

## 2020-06-13 LAB — GLUCOSE, CAPILLARY
Glucose-Capillary: 161 mg/dL — ABNORMAL HIGH (ref 70–99)
Glucose-Capillary: 121 mg/dL — ABNORMAL HIGH (ref 70–99)
Glucose-Capillary: 146 mg/dL — ABNORMAL HIGH (ref 70–99)
Glucose-Capillary: 204 mg/dL — ABNORMAL HIGH (ref 70–99)

## 2020-06-13 MED ORDER — DARBEPOETIN ALFA 60 MCG/0.3ML IJ SOSY
60.0000 ug | PREFILLED_SYRINGE | Freq: Once | INTRAMUSCULAR | Status: AC
  Administered 2020-06-13: 60 ug via SUBCUTANEOUS
  Filled 2020-06-13: qty 0.3

## 2020-06-13 MED ORDER — FUROSEMIDE 80 MG PO TABS
80.0000 mg | ORAL_TABLET | Freq: Two times a day (BID) | ORAL | Status: DC
  Administered 2020-06-14: 80 mg via ORAL
  Filled 2020-06-13: qty 1

## 2020-06-13 NOTE — Progress Notes (Signed)
Family Medicine Teaching Service Daily Progress Note Intern Pager: [phone]  Patient name: Tony Schmitt Medical record number: [id number] Date of birth: [birth year] Age: 49 y.o. Gender: male  Primary Care Provider: Charlott Rakes, MD Consultants: Nephro Code Status: FULL  Pt Overview and Major Events to Date:   11/5: admitted 11/6: nephro consulted  Assessment and Plan:  Tony Schmitt a 49 y.o.malepresenting with worsening bilateral lower extremity swelling. PMH is significant for Type 2 DM, HTN and recent CVA.   Bilateral LE edema 2/2 CKD stage IV with nephrotic syndrome Edema unchanged per patient and still with 2+ pitting edema in bilateral thighs and L upper extremity on exam. Patient with 4L urine output (net negative 2.8L) over past 24h. Cr 3.49 this morning (3.37 yesterday). -Nephro following, appreciate recommendations -Continue IV Lasix 120mg  BID -Renal function panel daily -Serum protein electrophoresis -Strict Is/Os -Daily weights -PT/OT eval and treat  Normocytic anemia Thrombocytosis  Hgb 9.7 on admission with platelets 469. Likely due to anemia of chronic disease and thrombocytosis 2/2 nephrotic syndrome. -am CBC  Type 2 DM Fasting glucose 161 this morning. Received 4u SAI yesterday. -sSSI -CBG qAC and qHS  HTN BP remains slightly elevated in 536R-443 systolic. -Continue home amlodipine 10mg  daily, carvedilol 12.5mg  BID, and hydralazine 25mg  TID. -Continue home meds -Monitor BP  History of CVA Hx of CVA in September 2021 with residual deficits. -Contine home ASA 81mg  daily and Atorvastatin 80mg  daily -PT/OT eval and treat  Constipation Last BM Friday 11/5.  -Miralax 17g BID  FEN/GI: Heart healthy, carb modified, sodium restriction PPx: Lovenox   Status is: Inpatient Remains inpatient appropriate because:IV treatments appropriate due to intensity of illness or inability to take PO  Dispo: The patient is from: Home               Anticipated d/c is to: Home              Anticipated d/c date is: 2 days              Patient currently is not medically stable to d/c.   Subjective:  No acute events overnight. Patient denies complaints this morning but feels his swelling is overall unchanged. Last bowel movement Friday 11/5. Patient thinks he'll be able to have a BM this afternoon.  Objective: Temp:  [98.6 F (37 C)-99.1 F (37.3 C)] 98.6 F (37 C) (11/08 0535) Pulse Rate:  [82-87] 83 (11/08 0535) Resp:  [17-20] 17 (11/08 0535) BP: (152-160)/(77-83) 160/83 (11/08 0535) SpO2:  [97 %-98 %] 97 % (11/08 0535) Physical Exam: General: alert, well-appearing, NAD Cardiovascular: RRR, normal S1/S2 without m/r/g Respiratory: normal WOB, bibasilar crackles Abdomen: +BS, soft, nontender Extremities: 2+ pitting edema in L upper extremity (in region of elbow), 2+ pitting edema of bilateral thighs. No significant pedal edema  Laboratory: Recent Labs  Lab 06/07/20 1551 06/10/20 1114 06/11/20 0707  WBC 10.4 10.3 9.5  HGB 9.8* 9.7* 9.3*  HCT 30.3* 31.1* 29.2*  PLT 471* 469* 453*   Recent Labs  Lab 06/07/20 1551 06/10/20 1114 06/10/20 1320 06/11/20 0707 06/12/20 0348 06/13/20 0237  NA 142   < >  --  144 142 140  K 4.1   < >  --  3.6 3.6 3.8  CL 107*   < >  --  112* 110 107  CO2  --    < >  --  23 24 25   BUN 37*   < >  --  26* 26* 30*  CREATININE 3.44*   < >  --  3.20* 3.37* 3.49*  CALCIUM 9.3   < >  --  9.2 9.2 9.4  PROT 5.6*  --  5.6*  --   --   --   BILITOT <0.2  --  0.4  --   --   --   ALKPHOS 183*  --  136*  --   --   --   ALT  --   --  39  --   --   --   AST 34  --  33  --   --   --   GLUCOSE 116*   < >  --  110* 149* 162*   < > = values in this interval not displayed.    Imaging/Diagnostic Tests: No new imaging/diagnostic tests  Alcus Dad, MD 06/13/2020, 7:22 AM PGY-1, [place] Intern pager: [phone], text pages welcome

## 2020-06-13 NOTE — Progress Notes (Signed)
Physical Therapy Treatment Patient Details Name: Tony Schmitt MRN: [id number] DOB: 06/13/1971 Today's Date: 06/13/2020    History of Present Illness Pt is a 49 year old man admitted with B LE edema and AKI on CKD. PMH: recent CVA with residual L side weakness, DM, HTN, CKD III, tobacco abuse, medical non compliance.     PT Comments    Patient received in bed, very pleasant and cooperative. Able to mobilize on a min guard-MinA level with RW, does need hand over hand assist to keep L hand on RW, as well as MinA to help steer RW on the left side but otherwise did very well with gait in hallway with occasional cues for safety and upright posture. Fatigued at end of gait distance. Still needs cues for hand placement/sequencing with transfers. Reports he does have 24/7 assist at home and still prefers to resume services at [facility], which based on his performance today is certainly reasonable. Left up in recliner with all needs met, chair alarm active and NT present and attending.    Follow Up Recommendations  Outpatient PT;Supervision for mobility/OOB     Equipment Recommendations  None recommended by PT (well equipped)    Recommendations for Other Services       Precautions / Restrictions Precautions Precautions: Fall;Other (comment) Precaution Comments: L hemiparesis Restrictions Weight Bearing Restrictions: No    Mobility  Bed Mobility Overal bed mobility: Needs Assistance Bed Mobility: Supine to Sit     Supine to sit: Min guard;HOB elevated     General bed mobility comments: HOB elevated, use of bedrail and increased time.   Transfers Overall transfer level: Needs assistance Equipment used: Rolling walker (2 wheeled) Transfers: Sit to/from Stand Sit to Stand: Min assist         General transfer comment: hand over hand assist to maintain LUE on RW, cues for hand placement with R UE/sequencing of transfer  Ambulation/Gait Ambulation/Gait assistance: Min  assist Gait Distance (Feet): 90 Feet Assistive device: Rolling walker (2 wheeled) Gait Pattern/deviations: Step-to pattern;Step-through pattern;Decreased stride length;Wide base of support;Decreased weight shift to left Gait velocity: reduced   General Gait Details: hand over hand assist to keep LUE on RW and to help steer RW on the left, otherwise moves well but with weakness/poor proprioception in L LE with cues for reciprocal pattern and upright posture   Stairs             Wheelchair Mobility    Modified Rankin (Stroke Patients Only)       Balance Overall balance assessment: Needs assistance Sitting-balance support: Feet supported;Bilateral upper extremity supported Sitting balance-Leahy Scale: Fair Sitting balance - Comments: S to maintain midline but did not challenge dynamically   Standing balance support: Bilateral upper extremity supported Standing balance-Leahy Scale: Fair Standing balance comment: heavy reliance on BUE support                            Cognition Arousal/Alertness: Awake/alert Behavior During Therapy: WFL for tasks assessed/performed Overall Cognitive Status: Within Functional Limits for tasks assessed                                 General Comments: very pleasant and cooperative, receptive to cues and feedback from PT      Exercises      General Comments        Pertinent Vitals/Pain Pain  Assessment: No/denies pain    Home Living                      Prior Function            PT Goals (current goals can now be found in the care plan section) Acute Rehab PT Goals Patient Stated Goal: return home PT Goal Formulation: With patient Time For Goal Achievement: 06/25/20 Potential to Achieve Goals: Good Progress towards PT goals: Progressing toward goals    Frequency    Min 3X/week      PT Plan Discharge plan needs to be updated    Co-evaluation              AM-PAC PT "6  Clicks" Mobility   Outcome Measure  Help needed turning from your back to your side while in a flat bed without using bedrails?: A Little Help needed moving from lying on your back to sitting on the side of a flat bed without using bedrails?: A Little Help needed moving to and from a bed to a chair (including a wheelchair)?: A Little Help needed standing up from a chair using your arms (e.g., wheelchair or bedside chair)?: A Little Help needed to walk in hospital room?: A Little Help needed climbing 3-5 steps with a railing? : A Lot 6 Click Score: 17    End of Session Equipment Utilized During Treatment: Gait belt Activity Tolerance: Patient tolerated treatment well Patient left: in chair;with call bell/phone within reach;with chair alarm set (NT present and attending) Nurse Communication: Mobility status PT Visit Diagnosis: Unsteadiness on feet (R26.81);Muscle weakness (generalized) (M62.81);Hemiplegia and hemiparesis Hemiplegia - Right/Left: Left Hemiplegia - dominant/non-dominant: Non-dominant Hemiplegia - caused by: Unspecified     Time: 4986-5168 PT Time Calculation (min) (ACUTE ONLY): 21 min  Charges:  $Gait Training: 8-22 mins                     Tony Schmitt, DPT, PN1   Supplemental Physical Therapist Tony Schmitt    Pager [phone] Acute Rehab Office [phone]

## 2020-06-13 NOTE — Telephone Encounter (Signed)
Patient verified DOB Patient is aware of lab results showing a decline in kidney function. Patient is currently admitted for the concerns yet he reports feeling well. Patient is aware of scheduling a HFU with the [facility] once he is released.

## 2020-06-13 NOTE — Progress Notes (Addendum)
[facility] NEPHROLOGY PROGRESS NOTE  Assessment/ Plan: Pt is a 49 y.o. yo male  w/ hx of HTN, DM2, recent CVA and CKD IV p/w worsening leg edema, consulted for AKI and known nephrotic syndrome.  He was seen by nephrology team back in 04/2020 when he had nephrotic syndrome with 12 g of proteinuria thought to be due to uncontrolled diabetes.  #CKD stage IV with recent baseline creatinine level around 2.7-3.2 admitted with fluid overload.  Responded very well with IV diuretics and his edema is much better.  He was not taking any diuretics before therefore I am changing lasix to PO 80 mg twice a day.  Kidney ultrasound done in 04/2020 was unremarkable.  Given his low GFR and poorly controlled diabetes, he probably has a lot of chronicity therefore do not think kidney biopsy will change the management.  Serologies including C3, C4, ANA, kappa lambda, hepatitis panel and ANCA were sent during admission, follow-up the result.  He will need close outpatient follow-up at [facility].  #Nephrotic syndrome thought to be due to diabetic nephropathy, did not have biopsy.  Last A1c was around 9.6. Repeat spot urine PCR and follow-up serologies as discussed above.  #Hypertension: Volume managed with diuretics.  On amlodipine, carvedilol, hydralazine and Lasix.  Monitor blood pressure.  #Anemia of CKD: Iron saturation 21% with high ferritin recently.  I will order a dose of Aranesp.  Subjective: Seen and examined.  He had around 4 L of urine output and doing well.  Lower extremity edema has much improved.  Denies nausea vomiting chest pain shortness of breath. Objective Vital signs in last 24 hours: Vitals:   06/12/20 1652 06/12/20 1959 06/13/20 0535 06/13/20 0950  BP: (!) 159/82 (!) 155/77 (!) 160/83 (!) 160/77  Pulse: 87 86 83 82  Resp: 20 18 17 18   Temp: 99 F (37.2 C) 99.1 F (37.3 C) 98.6 F (37 C) 98.2 F (36.8 C)  TempSrc: Oral Oral Oral Oral  SpO2: 97% 98% 97% 98%  Weight:       Height:       Weight change:   Intake/Output Summary (Last 24 hours) at 06/13/2020 1050 Last data filed at 06/13/2020 1035 Gross per 24 hour  Intake 964.3 ml  Output 4240 ml  Net -3275.7 ml       Labs: Basic Metabolic Panel: Recent Labs  Lab 06/11/20 0707 06/12/20 0348 06/13/20 0237  NA 144 142 140  K 3.6 3.6 3.8  CL 112* 110 107  CO2 23 24 25   GLUCOSE 110* 149* 162*  BUN 26* 26* 30*  CREATININE 3.20* 3.37* 3.49*  CALCIUM 9.2 9.2 9.4  PHOS  --  3.7 4.1   Liver Function Tests: Recent Labs  Lab 06/07/20 1551 06/10/20 1320 06/12/20 0348 06/13/20 0237  AST 34 33  --   --   ALT  --  39  --   --   ALKPHOS 183* 136*  --   --   BILITOT <0.2 0.4  --   --   PROT 5.6* 5.6*  --   --   ALBUMIN 2.9* 2.0* 1.6* 1.8*   No results for input(s): LIPASE, AMYLASE in the last 168 hours. No results for input(s): AMMONIA in the last 168 hours. CBC: Recent Labs  Lab 06/07/20 1551 06/10/20 1114 06/11/20 0707  WBC 10.4 10.3 9.5  NEUTROABS 7.4*  --  6.1  HGB 9.8* 9.7* 9.3*  HCT 30.3* 31.1* 29.2*  MCV 84 87.1 85.9  PLT 471*  469* 453*   Cardiac Enzymes: No results for input(s): CKTOTAL, CKMB, CKMBINDEX, TROPONINI in the last 168 hours. CBG: Recent Labs  Lab 06/12/20 0632 06/12/20 1122 06/12/20 1652 06/12/20 2132 06/13/20 0739  GLUCAP 126* 135* 155* 177* 161*    Iron Studies: No results for input(s): IRON, TIBC, TRANSFERRIN, FERRITIN in the last 72 hours. Studies/Results: No results found.  Medications: Infusions: . furosemide 120 mg (06/13/20 0547)    Scheduled Medications: . amLODipine  10 mg Oral Daily  . aspirin  81 mg Oral Daily  . atorvastatin  80 mg Oral QHS  . carvedilol  12.5 mg Oral BID WC  . enoxaparin (LOVENOX) injection  30 mg Subcutaneous Q24H  . hydrALAZINE  25 mg Oral TID  . insulin aspart  0-9 Units Subcutaneous TID WC  . pantoprazole  40 mg Oral Daily  . polyethylene glycol  17 g Oral BID    have reviewed scheduled and prn  medications.  Physical Exam: General:NAD, comfortable Heart:RRR, s1s2 nl Lungs:clear b/l, no crackle Abdomen:soft, Non-tender, non-distended Extremities:No edema, improved Neurology: Alert, awake, following commands  Facundo Allemand Tanna Furry 06/13/2020,10:50 AM  LOS: 2 days  Pager: [phone]

## 2020-06-14 LAB — RENAL FUNCTION PANEL
Albumin: 1.7 g/dL — ABNORMAL LOW (ref 3.5–5.0)
Anion gap: 8 (ref 5–15)
BUN: 34 mg/dL — ABNORMAL HIGH (ref 6–20)
CO2: 25 mmol/L (ref 22–32)
Calcium: 9.1 mg/dL (ref 8.9–10.3)
Chloride: 106 mmol/L (ref 98–111)
Creatinine, Ser: 3.44 mg/dL — ABNORMAL HIGH (ref 0.61–1.24)
GFR, Estimated: 21 mL/min — ABNORMAL LOW (ref >60)
Glucose, Bld: 152 mg/dL — ABNORMAL HIGH (ref 70–99)
Phosphorus: 3.9 mg/dL (ref 2.5–4.6)
Potassium: 3.8 mmol/L (ref 3.5–5.1)
Sodium: 139 mmol/L (ref 135–145)

## 2020-06-14 LAB — C3 COMPLEMENT: C3 Complement: 189 mg/dL — ABNORMAL HIGH (ref 82–167)

## 2020-06-14 LAB — CBC
HCT: 27.7 % — ABNORMAL LOW (ref 39.0–52.0)
Hemoglobin: 8.8 g/dL — ABNORMAL LOW (ref 13.0–17.0)
MCH: 27.2 pg (ref 26.0–34.0)
MCHC: 31.8 g/dL (ref 30.0–36.0)
MCV: 85.5 fL (ref 80.0–100.0)
Platelets: 413 10*3/uL — ABNORMAL HIGH (ref 150–400)
RBC: 3.24 MIL/uL — ABNORMAL LOW (ref 4.22–5.81)
RDW: 14.8 % (ref 11.5–15.5)
WBC: 9.6 10*3/uL (ref 4.0–10.5)
nRBC: 0 % (ref 0.0–0.2)

## 2020-06-14 LAB — GLUCOSE, CAPILLARY
Glucose-Capillary: 132 mg/dL — ABNORMAL HIGH (ref 70–99)
Glucose-Capillary: 136 mg/dL — ABNORMAL HIGH (ref 70–99)

## 2020-06-14 LAB — C4 COMPLEMENT: Complement C4, Body Fluid: 48 mg/dL — ABNORMAL HIGH (ref 12–38)

## 2020-06-14 LAB — MPO/PR-3 (ANCA) ANTIBODIES
ANCA Proteinase 3: <3.5 U/mL (ref 0.0–3.5)
Myeloperoxidase Abs: <9 U/mL (ref 0.0–9.0)

## 2020-06-14 MED ORDER — FUROSEMIDE 80 MG PO TABS
80.0000 mg | ORAL_TABLET | ORAL | 0 refills | Status: DC
Start: 2020-06-15 — End: 2020-07-07

## 2020-06-14 MED ORDER — FUROSEMIDE 80 MG PO TABS: 80.0000 mg | ORAL_TABLET | ORAL | Status: DC

## 2020-06-14 MED ORDER — MAGNESIUM CITRATE PO SOLN
1.0000 | Freq: Once | ORAL | Status: AC
  Administered 2020-06-14: 1 via ORAL
  Filled 2020-06-14: qty 296

## 2020-06-14 MED ORDER — GLIPIZIDE 5 MG PO TABS: 2.5000 mg | ORAL_TABLET | Freq: Two times a day (BID) | ORAL | 3 refills | Status: DC

## 2020-06-14 NOTE — Progress Notes (Signed)
Family Medicine Teaching Service Daily Progress Note Intern Pager: [phone]  Patient name: Tony Schmitt Medical record number: [id number] Date of birth: 11/22/1970 Age: 49 y.o. Gender: male  Primary Care Provider: Charlott Rakes, MD Consultants: Nephrology Code Status: FULL  Pt Overview and Major Events to Date:   11/5: admitted 11/6: nephro consulted  Assessment and Plan:  Tony Schmitt a 49 y.o.malepresenting with worsening bilateral lower extremity swelling. PMH is significant for Type 2 DM, HTN and recent CVA.   CKD Stage IV, Nephrotic Syndrome Edema significantly improved on today's exam. He had 2.2L UOP yesterday and is net negative 5.6L since admission. Patient's Cr stable at 3.44 today. -nephro following, appreciate recommendations -continue diuresis per nephro (Lasix 80mg  PO daily) -renal function panel daily -strict Is/Os, daily weights -outpatient PT  Normocytic Anemia   Thrombocytosis Hgb 8.8 today, from 9.8 on admission. Likely anemia of chronic disease given CKD and other comorbidities. Platelets 413, likely related to nephrotic syndrome. -s/p Aranesp per nephro -continue to monitor  T2DM Fasting glucose 132 this morning. Required 4u SAI yesterday. -sSSI -CBG qAC and qHS -Plan to renally adjust glipizide on discharge per pharmacy  HTN BP remains slightly elevated in the 732-202R systolic. -Continue home amlodipine 10mg  daily, carvedilol 12.5mg  BID, and hydralazine 25mg  TID -Continue to monitor BP  Hx of CVA Hx of CVA in September 2021 with residual L sided deficits. -Contine home ASA 81mg  daily and Atorvastatin 80mg  daily -Outpatient PT  Constipation Last [facility] Friday 11/5. Patient states he usually has BM every 5 days at baseline. -Continue Miralax BID -Will give Mag Citrate x1 today  FEN/GI: Heart healthy, carb modified, sodium restriction PPx: Lovenox   Status is: Inpatient Remains inpatient appropriate because:Inpatient level of care  appropriate due to severity of illness   Dispo: The patient is from: Home              Anticipated d/c is to: Home              Anticipated d/c date is: 1 day              Patient currently is medically stable to d/c.   Subjective:  Patient denies complaints this morning. Feels his swelling is significantly improved. Has not had bowel movement since Friday, but states he only has bowel movements every 5 days at baseline. Denies abdominal pain, nausea or other complaints.  Objective: Temp:  [98.1 F (36.7 C)-98.3 F (36.8 C)] 98.3 F (36.8 C) (11/09 0425) Pulse Rate:  [80-82] 81 (11/09 0425) Resp:  [18-20] 20 (11/09 0425) BP: (146-160)/(73-81) 151/73 (11/09 0425) SpO2:  [95 %-98 %] 98 % (11/09 0425) Weight:  [85.6 kg] 85.6 kg (11/08 2154) Physical Exam: General: alert, well-appearing, NAD Cardiovascular: RRR, normal S1/S2 without m/r/g Respiratory: normal WOB, lungs CTAB Abdomen: soft, nontender Extremities: no pedal edema, minimal to trace edema of bilateral thighs, 2+ pitting edema of L upper extremity in proximal forearm  Laboratory: Recent Labs  Lab 06/10/20 1114 06/11/20 0707 06/14/20 0221  WBC 10.3 9.5 9.6  HGB 9.7* 9.3* 8.8*  HCT 31.1* 29.2* 27.7*  PLT 469* 453* 413*   Recent Labs  Lab 06/07/20 1551 06/10/20 1114 06/10/20 1320 06/11/20 0707 06/12/20 0348 06/13/20 0237 06/14/20 0221  NA 142   < >  --    < > 142 140 139  K 4.1   < >  --    < > 3.6 3.8 3.8  CL 107*   < >  --    < >  110 107 106  CO2  --    < >  --    < > 24 25 25   BUN 37*   < >  --    < > 26* 30* 34*  CREATININE 3.44*   < >  --    < > 3.37* 3.49* 3.44*  CALCIUM 9.3   < >  --    < > 9.2 9.4 9.1  PROT 5.6*  --  5.6*  --   --   --   --   BILITOT <0.2  --  0.4  --   --   --   --   ALKPHOS 183*  --  136*  --   --   --   --   ALT  --   --  39  --   --   --   --   AST 34  --  33  --   --   --   --   GLUCOSE 116*   < >  --    < > 149* 162* 152*   < > = values in this interval not displayed.     Imaging/Diagnostic Tests: No new imaging/diagnostic tests  Alcus Dad, MD 06/14/2020, 6:24 AM PGY-1, [place] Intern pager: [phone], text pages welcome

## 2020-06-14 NOTE — Progress Notes (Signed)
Occupational Therapy Treatment Patient Details Name: Tony Schmitt MRN: [id number] DOB: November 05, 1970 Today's Date: 06/14/2020    History of present illness Pt is a 49 year old man admitted with B LE edema and AKI on CKD. PMH: recent CVA with residual L side weakness, DM, HTN, CKD III, tobacco abuse, medical non compliance.    OT comments  Patient seated EOB, eager for dc home today.  Completed dressing with mod assist for LB and min assist for UB, reviewed compensatory techniques and required increased time.  Encouraged assist from his mother as needed. Educated on use of AE for home, techniques verbally. Discussed safety and recommendations, continue to recommend OP OT services. Will follow acutely.    Follow Up Recommendations  Outpatient OT    Equipment Recommendations  None recommended by OT    Recommendations for Other Services      Precautions / Restrictions Precautions Precautions: Fall;Other (comment) Precaution Comments: L hemiparesis Restrictions Weight Bearing Restrictions: No       Mobility Bed Mobility               General bed mobility comments: EOB upon entry   Transfers Overall transfer level: Needs assistance Equipment used: Rolling walker (2 wheeled) Transfers: Sit to/from Stand Sit to Stand: Min guard         General transfer comment: min guard to steady, support L hand on RW     Balance Overall balance assessment: Needs assistance Sitting-balance support: No upper extremity supported;Feet supported Sitting balance-Leahy Scale: Good Sitting balance - Comments: supervision dynamically during dressing tasks    Standing balance support: Single extremity supported;Bilateral upper extremity supported;During functional activity Standing balance-Leahy Scale: Fair Standing balance comment: 1 UE support during LB dressing, dynamically relies on BUE Support                           ADL either performed or assessed with clinical judgement    ADL Overall ADL's : Needs assistance/impaired                 Upper Body Dressing : Minimal assistance;Sitting Upper Body Dressing Details (indicate cue type and reason): cueing for compesatory techniques, problem solving  Lower Body Dressing: Moderate assistance;Sit to/from stand Lower Body Dressing Details (indicate cue type and reason): assist with donning shoes, threading L side; min guard in standing with 1 UE support             Functional mobility during ADLs: Minimal assistance;Rolling walker General ADL Comments: patient educated on compensatory techniques, safety, and AE recommendations; pt agreeable to ask for assistance as needed from his mother     Vision       Perception     Praxis      Cognition Arousal/Alertness: Awake/alert Behavior During Therapy: WFL for tasks assessed/performed Overall Cognitive Status: Within Functional Limits for tasks assessed                                 General Comments: very pleasant and cooperative, receptive to cues and feedback from OT        Exercises     Shoulder Instructions       General Comments      Pertinent Vitals/ Pain       Pain Assessment: No/denies pain  Home Living  Prior Functioning/Environment              Frequency  Min 2X/week        Progress Toward Goals  OT Goals(current goals can now be found in the care plan section)  Progress towards OT goals: Progressing toward goals  Acute Rehab OT Goals Patient Stated Goal: return home OT Goal Formulation: With patient  Plan Discharge plan remains appropriate;Frequency remains appropriate    Co-evaluation                 AM-PAC OT "6 Clicks" Daily Activity     Outcome Measure   Help from another person eating meals?: A Little Help from another person taking care of personal grooming?: A Little Help from another person toileting, which includes  using toliet, bedpan, or urinal?: A Little Help from another person bathing (including washing, rinsing, drying)?: A Little Help from another person to put on and taking off regular upper body clothing?: A Little Help from another person to put on and taking off regular lower body clothing?: A Lot 6 Click Score: 17    End of Session Equipment Utilized During Treatment: Rolling walker  OT Visit Diagnosis: Unsteadiness on feet (R26.81);Other abnormalities of gait and mobility (R26.89);Hemiplegia and hemiparesis Hemiplegia - Right/Left: Left Hemiplegia - dominant/non-dominant: Non-Dominant Hemiplegia - caused by: Cerebral infarction   Activity Tolerance Patient tolerated treatment well   Patient Left with call bell/phone within reach;Other (comment);with family/visitor present (seated EOB )   Nurse Communication Mobility status;Other (comment) (ready for dc)        Time: 1353-1410 OT Time Calculation (min): 17 min  Charges: OT General Charges $OT Visit: 1 Visit OT Treatments $Self Care/Home Management : 8-22 mins  Jolaine Artist, OT [place] Pager [phone] Office [phone]    Delight Stare 06/14/2020, 2:34 PM

## 2020-06-14 NOTE — Progress Notes (Signed)
Discharge teaching complete. Meds, diet, activity, follow up appointments reviewed and all questions answered. Copy of instructions given to patient and prescriptions sent to pharmacy.  

## 2020-06-14 NOTE — Discharge Instructions (Signed)
Dear Tony Schmitt,   Thank you for letting us participate in your care! In this section, you will find a brief summary of why you were admitted to the hospital, what happened during your admission, your diagnosis/diagnoses, and recommended follow up.   You were admitted because you were experiencing increased swelling.  You were diagnosed with nephrotic syndrome and kidney disease.  You were treated with a diuretic called Lasix (water pill) to remove some of the excess fluid   You were also seen by our nephrology team (the kidney doctors). They recommended you continue the Lasix after you leave the hospital.  POST-HOSPITAL & CARE INSTRUCTIONS 1. Please follow up with your primary care physician in 2 weeks. 2. Please see medications section of this packet for any medication changes.   DOCTOR'S APPOINTMENT & FOLLOW UP CARE INSTRUCTIONS  Future Appointments  Date Time Provider [place]  07/07/2020  1:20 PM Jamse Arn, MD [facility] CPR  08/29/2020  3:10 PM Charlott Rakes, MD [facility] None    Thank you for choosing [facility]! Take care and be well!  [facility]  [place], [place] [address] [phone]

## 2020-06-14 NOTE — Progress Notes (Addendum)
[facility] NEPHROLOGY PROGRESS NOTE  Assessment/ Plan: Pt is a 49 y.o. yo male  w/ hx of HTN, DM2, recent CVA and CKD IV p/w worsening leg edema, consulted for AKI and known nephrotic syndrome.  He was seen by nephrology team back in 04/2020 when he had nephrotic syndrome with 12 g of proteinuria thought to be due to uncontrolled diabetes.  #CKD stage IV with recent baseline creatinine level around 2.7-3.2 admitted with fluid overload.  Responded very well with IV diuretics and his edema is much better.  He was not taking any diuretics before therefore I am changing lasix to PO 80 mg daily.  Kidney ultrasound done in 04/2020 was unremarkable.  Given his low GFR and poorly controlled diabetes, he probably has a lot of chronicity therefore do not think kidney biopsy will change the management.  Creatinine level is stable.  Serologies including C3, C4, ANA, hepatitis panel unremarkable.  Still pending ANCA and free light chain which will be followed as outpatient.  Outpatient follow-up at [facility] office on 07/13/2020 to arrive at 2:45 PM with Dr. Marval Regal.  #Nephrotic syndrome thought to be due to diabetic nephropathy, did not have biopsy.  Last A1c was around 9.6. Repeat spot urine PCR and follow-up serologies as discussed above.  #Hypertension: Volume managed with diuretics.  On amlodipine, carvedilol, hydralazine and Lasix.  Monitor blood pressure.  #Anemia of CKD: Iron saturation 21% with high ferritin recently.  Received a dose of Aranesp yesterday.  Subjective: Seen and examined.  Urine output around 2 L and creatinine level stable.  Denies nausea vomiting chest pain shortness of breath.  Lower extremity edema has improved significantly. Objective Vital signs in last 24 hours: Vitals:   06/13/20 0950 06/13/20 1746 06/13/20 2154 06/14/20 0425  BP: (!) 160/77 (!) 146/81 (!) 149/74 (!) 151/73  Pulse: 82 80 81 81  Resp: 18 18 18 20   Temp: 98.2 F (36.8 C) 98.1  F (36.7 C) 98.1 F (36.7 C) 98.3 F (36.8 C)  TempSrc: Oral Oral Oral Oral  SpO2: 98%  95% 98%  Weight:   85.6 kg   Height:       Weight change:   Intake/Output Summary (Last 24 hours) at 06/14/2020 0916 Last data filed at 06/14/2020 0700 Gross per 24 hour  Intake 360 ml  Output 1450 ml  Net -1090 ml       Labs: Basic Metabolic Panel: Recent Labs  Lab 06/12/20 0348 06/13/20 0237 06/14/20 0221  NA 142 140 139  K 3.6 3.8 3.8  CL 110 107 106  CO2 24 25 25   GLUCOSE 149* 162* 152*  BUN 26* 30* 34*  CREATININE 3.37* 3.49* 3.44*  CALCIUM 9.2 9.4 9.1  PHOS 3.7 4.1 3.9   Liver Function Tests: Recent Labs  Lab 06/07/20 1551 06/10/20 1320 06/10/20 1320 06/12/20 0348 06/13/20 0237 06/14/20 0221  AST 34 33  --   --   --   --   ALT  --  39  --   --   --   --   ALKPHOS 183* 136*  --   --   --   --   BILITOT <0.2 0.4  --   --   --   --   PROT 5.6* 5.6*  --   --   --   --   ALBUMIN 2.9* 2.0*   < > 1.6* 1.8* 1.7*   < > = values in this interval not displayed.   No  results for input(s): LIPASE, AMYLASE in the last 168 hours. No results for input(s): AMMONIA in the last 168 hours. CBC: Recent Labs  Lab 06/07/20 1551 06/10/20 1114 06/11/20 0707 06/14/20 0221  WBC 10.4 10.3 9.5 9.6  NEUTROABS 7.4*  --  6.1  --   HGB 9.8* 9.7* 9.3* 8.8*  HCT 30.3* 31.1* 29.2* 27.7*  MCV 84 87.1 85.9 85.5  PLT 471* 469* 453* 413*   Cardiac Enzymes: No results for input(s): CKTOTAL, CKMB, CKMBINDEX, TROPONINI in the last 168 hours. CBG: Recent Labs  Lab 06/13/20 0739 06/13/20 1142 06/13/20 1643 06/13/20 2151 06/14/20 0707  GLUCAP 161* 121* 146* 204* 132*    Iron Studies: No results for input(s): IRON, TIBC, TRANSFERRIN, FERRITIN in the last 72 hours. Studies/Results: No results found.  Medications: Infusions:   Scheduled Medications:  amLODipine  10 mg Oral Daily   aspirin  81 mg Oral Daily   atorvastatin  80 mg Oral QHS   carvedilol  12.5 mg Oral BID WC    enoxaparin (LOVENOX) injection  30 mg Subcutaneous Q24H   [START ON 06/15/2020] furosemide  80 mg Oral Q24H   hydrALAZINE  25 mg Oral TID   insulin aspart  0-9 Units Subcutaneous TID WC   pantoprazole  40 mg Oral Daily   polyethylene glycol  17 g Oral BID    have reviewed scheduled and prn medications.  Physical Exam: General:NAD, comfortable Heart:RRR, s1s2 nl Lungs:clear b/l, no crackle Abdomen:soft, Non-tender, non-distended Extremities:No edema, improved Neurology: Alert, awake, following commands  Tinita Brooker Tanna Furry 06/14/2020,9:16 AM  LOS: 3 days  Pager: [phone]

## 2020-06-14 NOTE — Discharge Summary (Addendum)
[facility] Discharge Summary  Patient name: Tony Schmitt Medical record number: [id number] Date of birth: Jul 30, 1971 Age: 49 y.o. Gender: male Date of Admission: 06/10/2020  Date of Discharge: 06/14/2020 Admitting Physician: Donney Dice, DO  Primary Care Provider: Charlott Rakes, MD Consultants: Nephrology  Indication for Hospitalization: Edema  Discharge Diagnoses/Problem List:  Nephrotic Syndrome CKD Stage IV Anemia of Chronic Disease Type 2 Diabetes HTN Hx of CVA GERD  Disposition: Home  Discharge Condition: Improved, stable  Discharge Exam:  General: alert, well-appearing, NAD Cardiovascular: RRR, normal S1/S2 without m/r/g Respiratory: normal WOB, lungs CTAB Abdomen: soft, nontender Extremities: no pedal edema, minimal to trace edema of bilateral thighs, 2+ pitting edema of L upper extremity in proximal forearm, no R upper extremity edema  Brief Hospital Course:   Tony Schmitt is a 49 y.o. male who presented with worsening bilateral lower extremity swelling. PMH is significant for Type 2 DM, HTN and recent CVA.   Nephrotic Syndrome, CKD Stage IV Patient presented with one month of progressively worsening lower extremity edema, particularly in his thighs. Workup was consistent with nephrotic syndrome given hypoalbuminemia of 2.0, elevated protein creatinine ratio of 12.66, and hyperlipidemia (LDL 499), as well as kidney dysfunction (Cr 3.44 on admission). Nephrology was consulted and did not feel biopsy would change management and the likely underlying etiology is longstanding DM. He was started on high dose diuretics per nephro (IV Lasix 120mg  BID initially) with good response. Patient was discharged on 80mg  PO Lasix daily and instructed to follow up with nephrology outpatient.   T2DM Patient has a hx of T2DM and was placed on SSI and glucose checks qAC and qHS. His glucose remained stable throughout admission, ranging from 109-204. On  discharge, patient's home glipizide was decreased from 5mg  BID to 2.5mg  BID due to kidney function.   Normocytic Anemia Patient's hemoglobin was 9.8 on admission, consistent with anemia of chronic disease 2/2 CKD and other comorbidities. He was given Aranesp x1 per nephro.  HTN Patient's BP ranged from 191-478 systolic throughout admission. He was maintained on his home medications of amlodipine 10mg  daily, carvedilol 12.5mg  and hydralazine 25mg  TID.  Recent CVA Patient diagnosed with CVA in September 2021. Has some residual L sided deficits and receives outpatient physical therapy in [facility].   Issues for Follow Up:  Recommend repeat renal function panel in 2 weeks with PCP Follow up with Nephrology   Significant Procedures: None  Significant Labs and Imaging:  Recent Labs  Lab 06/10/20 1114 06/11/20 0707 06/14/20 0221  WBC 10.3 9.5 9.6  HGB 9.7* 9.3* 8.8*  HCT 31.1* 29.2* 27.7*  PLT 469* 453* 413*   Recent Labs  Lab 06/07/20 1551 06/07/20 1551 06/10/20 1114 06/10/20 1114 06/10/20 1320 06/11/20 0707 06/11/20 0707 06/12/20 0348 06/12/20 0348 06/13/20 0237 06/14/20 0221  NA 142  --  143  --   --  144  --  142  --  140 139  K 4.1   < > 3.9   < >  --  3.6   < > 3.6   < > 3.8 3.8  CL 107*   < > 110  --   --  112*  --  110  --  107 106  CO2  --   --  23  --   --  23  --  24  --  25 25  GLUCOSE 116*  --  179*  --   --  110*  --  149*  --  162* 152*  BUN 37*  --  31*  --   --  26*  --  26*  --  30* 34*  CREATININE 3.44*   < > 3.42*  --   --  3.20*  --  3.37*  --  3.49* 3.44*  CALCIUM 9.3   < > 9.0  --   --  9.2  --  9.2  --  9.4 9.1  MG  --   --   --   --   --  1.5*  --   --   --   --   --   PHOS  --   --   --   --   --   --   --  3.7  --  4.1 3.9  ALKPHOS 183*  --   --   --  136*  --   --   --   --   --   --   AST 34  --   --   --  33  --   --   --   --   --   --   ALT  --   --   --   --  39  --   --   --   --   --   --   ALBUMIN 2.9*  --   --   --  2.0*  --   --   1.6*  --  1.8* 1.7*   < > = values in this interval not displayed.    Results/Tests Pending at Time of Discharge: Protein electrophoresis, mpo/pr-3 (anca) antibodies, ANA with reflex, kappa/lambda light chain  Discharge Medications:  Allergies as of 06/14/2020       Reactions   Latex Itching        Medication List     TAKE these medications    acetaminophen 325 MG tablet Commonly known as: TYLENOL Take 2 tablets (650 mg total) by mouth every 4 (four) hours as needed for mild pain (or temp > 37.5 C (99.5 F)).   amLODipine 10 MG tablet Commonly known as: NORVASC Take 1 tablet (10 mg total) by mouth daily.   aspirin 81 MG chewable tablet Chew 1 tablet (81 mg total) by mouth daily. What changed: when to take this   atorvastatin 80 MG tablet Commonly known as: LIPITOR Take 1 tablet (80 mg total) by mouth at bedtime.   carvedilol 12.5 MG tablet Commonly known as: COREG Take 1 tablet (12.5 mg total) by mouth 2 (two) times daily with a meal.   furosemide 80 MG tablet Commonly known as: LASIX Take 1 tablet (80 mg total) by mouth daily. Start taking on: June 15, 2020   glipiZIDE 5 MG tablet Commonly known as: GLUCOTROL Take 0.5 tablets (2.5 mg total) by mouth 2 (two) times daily before a meal. What changed: how much to take   hydrALAZINE 25 MG tablet Commonly known as: APRESOLINE Take 1 tablet (25 mg total) by mouth 3 (three) times daily.   omeprazole 40 MG capsule Commonly known as: PRILOSEC Take 1 capsule (40 mg total) by mouth daily.   polyethylene glycol 17 g packet Commonly known as: MIRALAX / GLYCOLAX Take 17 g by mouth 2 (two) times daily. What changed:  when to take this reasons to take this   senna-docusate 8.6-50 MG tablet Commonly known as: Senokot-S Take 2 tablets by mouth at bedtime.   sodium bicarbonate 650 MG  tablet Take 2 tablets (1,300 mg total) by mouth 2 (two) times daily. What changed:  when to take this additional instructions    Vitamin D (Ergocalciferol) 1.25 MG (50000 UNIT) Caps capsule Commonly known as: DRISDOL Take 1 capsule (50,000 Units total) by mouth every 7 (seven) days. What changed: when to take this        Discharge Instructions: Please refer to Patient Instructions section of EMR for full details.  Patient was counseled important signs and symptoms that should prompt return to medical care, changes in medications, dietary instructions, activity restrictions, and follow up appointments.   Follow-Up Appointments:    Patient instructed to schedule follow up appointment with PCP in 2 weeks. Has appointment with Dr. Marval Regal at [facility] on 07/13/2020- patient to arrive at 2:45pm.   Alcus Dad, MD 06/14/2020, 12:06 PM PGY-1, [facility]-Level Resident Addendum   I have independently interviewed and examined the patient. I have discussed the above with the original author and agree with their documentation. Please see also any attending notes.   Gifford Shave, MD PGY-2, [facility] 06/14/2020 2:51 PM  FPTS Service pager: [phone] (text pages welcome through [facility]

## 2020-06-15 ENCOUNTER — Telehealth: Payer: Self-pay

## 2020-06-15 NOTE — Telephone Encounter (Signed)
Transition Care Management Unsuccessful Follow-up Telephone Call  Date of discharge and from where:  06/14/2020, [facility]  Attempts:  1st Attempt  Reason for unsuccessful TCM follow-up call:  Left voice message - on both phone numbers # [phone] and [phone].  Patient needs to schedule follow up appointment with PCP

## 2020-06-15 NOTE — Telephone Encounter (Signed)
From the discharge call:      His mother said that he is " doing good."   she is requesting a refill of sodium bicarbonate for her son. He does not have any.      She said she has all of his other medications including the furosemide and she did not have any questions about the med regime.  She stated that she oversees managing his medications.    he goes to outpatient PT at [facility].  His appointment with Dr Margarita Rana is 08/29/2020.  His mother did not want to schedule an appointment any earlier at this time.

## 2020-06-15 NOTE — Telephone Encounter (Signed)
Transition Care Management Follow-up Telephone Call  Call completed with patient's mother, Tony Schmitt. DPR on file to speak with Tony Schmitt  Date of discharge and from where: 06/14/2020, [facility]  How have you been since you were released from the hospital? His mother said that he is " doing good."  Any questions or concerns? Yes - she is requesting a refill of sodium bicarbonate for her son. He does not have any.    Items Reviewed:  Did the pt receive and understand the discharge instructions provided? Yes   Medications obtained and verified? question regarding sodium bicarb noted above. She said she has all of his other medications including the furosemide and she did not have any questions about the med regime.  She stated that she oversees managing his medications.   Other? No   Any new allergies since your discharge? No   Do you have support at home? yes, his mother   Home Care and Equipment/Supplies: Were home health services ordered?no, he goes to outpatient PT at [facility]. If so, what is the name of the agency? n/a Has the agency set up a time to come to the patient's home?n/a Were any new equipment or medical supplies ordered?  No, he already has a cane and walker.  His mother said that he hasn't been using the walker but he will use the cane when he goes out.  What is the name of the medical supply agency?n/a Were you able to get the supplies/equipment? N/a Do you have any questions related to the use of the equipment or supplies? No  Functional Questionnaire: (I = Independent and D = Dependent) ADLs: mother assists as needed  Follow up appointments reviewed:   PCP Hospital f/u appt confirmed? his appointment with Dr Margarita Rana is not until 08/29/2020 but his mother did not want to make any appointment for him to be seen any sooner   [facility] f/u appt confirmed? Yes  - Dr Posey Pronto-  07/07/2020; [facility] 07/13/2020. His mother said she will need  to call to reschedule the appointment with [facility] because he has a PT appointment at the same time. Provided her with the phone number for [facility].  Are transportation arrangements needed? No    If their condition worsens, is the pt aware to call PCP or go to the Emergency Dept.?  Yes  Was the patient provided with contact information for the PCP's office or ED? His mother has the phone number for [facility]  Was to pt encouraged to call back with questions or concerns? yes

## 2020-06-15 NOTE — Telephone Encounter (Signed)
Copied from [place] [id number]. Topic: General - Other >> Jun 15, 2020 12:33 PM [place], [place] wrote: Reason for CRM: Pt mother called for update on refill request for sodium bicarbonate 650 MG tablet as pt only has 2 pills remaining

## 2020-06-16 MED ORDER — SODIUM BICARBONATE 650 MG PO TABS
1300.0000 mg | ORAL_TABLET | Freq: Two times a day (BID) | ORAL | 1 refills | Status: DC
Start: 2020-06-16 — End: 2020-08-16

## 2020-06-16 NOTE — Telephone Encounter (Signed)
Pt's mother called to report that the patient is completely out of his medication. She is seeking to have this refilled at the [facility] on [address]. Please advise  Pt's mother also states that this Rx was listed on what he needs to take since he just left the hospital. Please advise

## 2020-06-16 NOTE — Telephone Encounter (Signed)
Call placed to patient's mother to inform her that Dr Margarita Rana has refilled the prescription for sodium bicarbonate. Message left with call back requested to this CM.

## 2020-06-16 NOTE — Telephone Encounter (Signed)
Requested medication (s) are due for refill today: yes  Requested medication (s) are on the active medication list: yes  Last refill:  05/11/20 #120 0 refills  Future visit scheduled: yes in 2 months   Notes to clinic:  last ordered by [facility]. Patient is out of medication. Do you want to refill?     Requested Prescriptions  Pending Prescriptions Disp Refills   sodium bicarbonate 650 MG tablet 120 tablet 0    Sig: Take 2 tablets (1,300 mg total) by mouth 2 (two) times daily.      Endocrinology:  Minerals Passed - 06/16/2020 10:53 AM      Passed - Valid encounter within last 12 months    Recent Outpatient Visits           3 weeks ago Hemiparesis affecting left side as late effect of cerebrovascular accident [facility])   [place], [place], [place]   10 months ago Hypertension associated with type 2 diabetes mellitus [facility])   Primary Care at [place], [place], MD   1 year ago Hypertension associated with type 2 diabetes mellitus [facility])   Primary Care at [facility], [place], MD   1 year ago Hypertension associated with type 2 diabetes mellitus [facility])   Primary Care at [facility], [place], MD   1 year ago Type 2 diabetes mellitus with hyperglycemia, without long-term current use of insulin [facility])   Primary Care at [facility], [place], MD       Future Appointments             In 2 months Charlott Rakes, MD [place]

## 2020-06-16 NOTE — Telephone Encounter (Signed)
Refilled

## 2020-07-07 ENCOUNTER — Encounter: Payer: Self-pay | Attending: Physical Medicine & Rehabilitation | Admitting: Physical Medicine & Rehabilitation

## 2020-07-07 ENCOUNTER — Encounter: Payer: Self-pay | Admitting: Physical Medicine & Rehabilitation

## 2020-07-07 ENCOUNTER — Other Ambulatory Visit: Payer: Self-pay | Admitting: Family Medicine

## 2020-07-07 ENCOUNTER — Other Ambulatory Visit: Payer: Self-pay

## 2020-07-07 VITALS — BP 173/93 | HR 84 | Temp 98.7°F | Ht 64.0 in | Wt 174.8 lb

## 2020-07-07 DIAGNOSIS — E1165 Type 2 diabetes mellitus with hyperglycemia: Secondary | ICD-10-CM | POA: Insufficient documentation

## 2020-07-07 DIAGNOSIS — I10 Essential (primary) hypertension: Secondary | ICD-10-CM | POA: Insufficient documentation

## 2020-07-07 DIAGNOSIS — R269 Unspecified abnormalities of gait and mobility: Secondary | ICD-10-CM | POA: Insufficient documentation

## 2020-07-07 DIAGNOSIS — I639 Cerebral infarction, unspecified: Secondary | ICD-10-CM | POA: Insufficient documentation

## 2020-07-07 DIAGNOSIS — N179 Acute kidney failure, unspecified: Secondary | ICD-10-CM | POA: Insufficient documentation

## 2020-07-07 MED ORDER — FUROSEMIDE 80 MG PO TABS
80.0000 mg | ORAL_TABLET | ORAL | 0 refills | Status: DC
Start: 2020-07-07 — End: 2020-08-08

## 2020-07-07 NOTE — Telephone Encounter (Signed)
  Notes to clinic:  medication given in hospital  Review for refill    Requested Prescriptions  Pending Prescriptions Disp Refills   furosemide (LASIX) 80 MG tablet 30 tablet 0    Sig: Take 1 tablet (80 mg total) by mouth daily.      Cardiovascular:  Diuretics - Loop Failed - 07/07/2020  9:21 AM      Failed - Cr in normal range and within 360 days    Creatinine, Ser  Date Value Ref Range Status  06/14/2020 3.44 (H) 0.61 - 1.24 mg/dL Final   Creatinine, Urine  Date Value Ref Range Status  06/11/2020 87.72 mg/dL Final    Comment:    Performed at [facility] [address], [place], [place] [address]          Failed - Last BP in normal range    BP Readings from Last 1 Encounters:  06/14/20 (!) 157/82          Passed - K in normal range and within 360 days    Potassium  Date Value Ref Range Status  06/14/2020 3.8 3.5 - 5.1 mmol/L Final          Passed - Ca in normal range and within 360 days    Calcium  Date Value Ref Range Status  06/14/2020 9.1 8.9 - 10.3 mg/dL Final   Calcium, Ion  Date Value Ref Range Status  04/16/2020 1.30 1.15 - 1.40 mmol/L Final          Passed - Na in normal range and within 360 days    Sodium  Date Value Ref Range Status  06/14/2020 139 135 - 145 mmol/L Final  06/07/2020 142 134 - 144 mmol/L Final          Passed - Valid encounter within last 6 months    Recent Outpatient Visits           1 month ago Hemiparesis affecting left side as late effect of cerebrovascular accident [facility])   [place], [place], MD   10 months ago Hypertension associated with type 2 diabetes mellitus [facility])   Primary Care at [place], [place], MD   1 year ago Hypertension associated with type 2 diabetes mellitus [facility])   Primary Care at [place], [place], MD   1 year ago Hypertension associated with type 2 diabetes mellitus [facility])   Primary Care at [facility], [place], MD   1 year  ago Type 2 diabetes mellitus with hyperglycemia, without long-term current use of insulin [facility])   Primary Care at [facility], [place], MD       Future Appointments             In 1 month Charlott Rakes, MD [place]

## 2020-07-07 NOTE — Progress Notes (Signed)
Subjective:    Patient ID: Tony Schmitt, male    DOB: 1970/09/20, 49 y.o.   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   Last clinic visit on 05/23/20. Since that time, pt was admitted to the hospital for volume overload.  Pt states he is doing "a little" HEP. He is "sometimes" wearing his WHO/PRAFO. BP is elevated.  He states elevated at home as well.  States he has not spoken to his doctor. CBGs have been ~50. Golden [place] off the edge of his bed, doesn't know how it happened.   Pain Inventory Average Pain 2 Pain Right Now 2 My pain is intermittent, sharp, burning, stabbing and tingling  LOCATION OF PAIN  Thigh, leg, toes  BOWEL Number of stools per week: 2 Oral laxative use na Type of laxative Miralax Enema or suppository use No  History of colostomy No  Incontinent Yes   BLADDER Normal In and out cath, frequency N/A Able to self cath N/A Bladder incontinence No  Frequent urination Yes  Leakage with coughing No  Difficulty starting stream No  Incomplete bladder emptying No    Mobility use a cane how many minutes can you walk? 20 MINS ability to climb steps?  yes do you drive?  no use a wheelchair needs help with transfers transfers alone Do you have any goals in this area?  yes  Function disabled: date disabled Disable since 2019  Neuro/Psych tingling trouble walking  Prior Studies Any changes since last visit?  no  Physicians involved in your care Any changes since last visit?  no .   Family History  Problem Relation Age of Onset  . Diabetes Mother   . Diabetes Father   . Hypertension Father   . Cancer Father   . Heart failure Other    Social History   Socioeconomic History  . Marital status: Single    Spouse  name: Not on file  . Number of children: Not on file  . Years of education: Not on file  . Highest education level: Not on file  Occupational History  . Not on file  Tobacco Use  . Smoking status: Former Smoker    Types: Cigarettes, Cigars  . Smokeless tobacco: Never Used  Vaping Use  . Vaping Use: Never used  Substance and Sexual Activity  . Alcohol use: Not Currently  . Drug use: Never  . Sexual activity: Not on file  Other Topics Concern  . Not on file  Social History Narrative  . Not on file   Social Determinants of Health   Financial Resource Strain:   . Difficulty of Paying Living Expenses: Not on file  Food Insecurity:   . Worried About Charity fundraiser in the Last Year: Not on file  . Ran Out of Food in the Last Year: Not on file  Transportation Needs:   . Lack of Transportation (Medical): Not on file  . Lack of Transportation (Non-Medical): Not on file  Physical Activity:   . Days of Exercise per Week: Not on file  . Minutes of Exercise per Session: Not on file  Stress:   . Feeling of Stress : Not on file  Social Connections:   . Frequency of Communication with Friends and Family: Not on file  . Frequency of Social Gatherings  with Friends and Family: Not on file  . Attends Religious Services: Not on file  . Active Member of Clubs or Organizations: Not on file  . Attends Archivist Meetings: Not on file  . Marital Status: Not on file   Past Surgical History:  Procedure Laterality Date  . BUBBLE STUDY  04/18/2020   Procedure: BUBBLE STUDY;  Surgeon: Sueanne Margarita, MD;  Location: [facility];  Service: Cardiovascular;;  . TEE WITHOUT CARDIOVERSION N/A 04/18/2020   Procedure: TRANSESOPHAGEAL ECHOCARDIOGRAM (TEE);  Surgeon: Sueanne Margarita, MD;  Location: [facility] ENDOSCOPY;  Service: Cardiovascular;  Laterality: N/A;   Past Medical History:  Diagnosis Date  . Diabetes mellitus without complication ([place])   . Hypertension    BP (!) 173/93   Pulse 84    Temp 98.7 F (37.1 C)   Ht 5\' 4"  (1.626 m)   Wt 174 lb 12.8 oz (79.3 kg)   SpO2 96%   BMI 30.00 kg/m   Opioid Risk Score:   Fall Risk Score:  `1  Depression screen PHQ 2/9  Depression screen [facility] 2/9 06/07/2020 05/26/2020 05/23/2020 08/19/2019 05/21/2019 04/23/2019 04/16/2019  Decreased Interest 0 2 0 0 0 0 0  Down, Depressed, Hopeless 0 0 0 0 0 0 0  PHQ - 2 Score 0 2 0 0 0 0 0  Altered sleeping 1 0 1 - - - -  Tired, decreased energy 1 3 0 - - - -  Change in appetite 0 1 0 - - - -  Feeling bad or failure about yourself  0 0 0 - - - -  Trouble concentrating 0 0 0 - - - -  Moving slowly or fidgety/restless 0 1 0 - - - -  Suicidal thoughts 0 0 0 - - - -  PHQ-9 Score 2 7 1  - - - -   Review of Systems  Constitutional: Negative.   HENT: Negative.   Eyes: Negative.   Respiratory: Negative.   Cardiovascular: Positive for leg swelling.  Endocrine: Negative.   Genitourinary: Negative.   Musculoskeletal: Positive for arthralgias and gait problem. Negative for myalgias.       Pain in both ankles  Skin: Negative.   Allergic/Immunologic: Negative.   Neurological: Positive for weakness and numbness.  Hematological: Negative.   Psychiatric/Behavioral: Negative.   All other systems reviewed and are negative.     Objective:   Physical Exam  Constitutional: No distress . Vital signs reviewed. HENT: Normocephalic.  Atraumatic. Eyes: EOMI. No discharge. Cardiovascular: No JVD.   Respiratory: Normal effort.  No stridor.   GI: Non-distended.   Skin: Warm and dry.  Intact. Psych: Normal mood.  Normal behavior. Musc: Right hand edema. No tenderness. 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   1.  Left side hemiparesis, now with spasticity secondary to acute/subacute nonhemorrhagic infarct posterior right corona radiata and anterior aspect of left external capsule.              Cont HEP, encouraged again  Encouraged compliance with WHO/PRAFO nightly again  Will avoid Baclofen to due renal function  Will consider Botulinum toxin injection  2.  Hypertension.    Elevated today, states runs this high at home as well  Continue meds  Encouraged follow up appointment with Nephro again  3. Gait abnormality  Cont cane safety  Encouraged HEP  4. AKI, CKD now  Follow up with Nephro

## 2020-07-07 NOTE — Telephone Encounter (Signed)
Medication Refill - Medication: Furosemide   Has the patient contacted their pharmacy? Yes.   (Agent: If no, request that the patient contact the pharmacy for the refill.) (Agent: If yes, when and what did the pharmacy advise?)  Preferred Pharmacy (with phone number or street name):  [facility] ([address] [place] - [address]  [address] [place]) [place] [address]  Phone: [phone] Fax: [phone]  Hours: Not open 24 hours     Agent: Please be advised that RX refills may take up to 3 business days. We ask that you follow-up with your pharmacy.

## 2020-08-04 ENCOUNTER — Encounter: Payer: Self-pay | Admitting: Physical Medicine & Rehabilitation

## 2020-08-08 ENCOUNTER — Other Ambulatory Visit: Payer: Self-pay | Admitting: Family Medicine

## 2020-08-15 ENCOUNTER — Other Ambulatory Visit: Payer: Self-pay | Admitting: Family Medicine

## 2020-08-20 ENCOUNTER — Other Ambulatory Visit: Payer: Self-pay | Admitting: Physician Assistant

## 2020-08-20 ENCOUNTER — Other Ambulatory Visit: Payer: Self-pay | Admitting: Family Medicine

## 2020-08-20 DIAGNOSIS — E559 Vitamin D deficiency, unspecified: Secondary | ICD-10-CM

## 2020-08-24 ENCOUNTER — Other Ambulatory Visit: Payer: Self-pay | Admitting: Physician Assistant

## 2020-08-24 ENCOUNTER — Other Ambulatory Visit: Payer: Self-pay | Admitting: Family Medicine

## 2020-08-24 DIAGNOSIS — E559 Vitamin D deficiency, unspecified: Secondary | ICD-10-CM

## 2020-08-29 ENCOUNTER — Ambulatory Visit (HOSPITAL_BASED_OUTPATIENT_CLINIC_OR_DEPARTMENT_OTHER): Payer: Self-pay | Admitting: Pharmacist

## 2020-08-29 ENCOUNTER — Ambulatory Visit: Payer: Self-pay | Attending: Family Medicine | Admitting: Family Medicine

## 2020-08-29 ENCOUNTER — Encounter: Payer: Self-pay | Admitting: Family Medicine

## 2020-08-29 ENCOUNTER — Other Ambulatory Visit: Payer: Self-pay

## 2020-08-29 VITALS — BP 160/71 | HR 83 | Temp 98.7°F | Ht 64.0 in | Wt 170.0 lb

## 2020-08-29 DIAGNOSIS — K2100 Gastro-esophageal reflux disease with esophagitis, without bleeding: Secondary | ICD-10-CM

## 2020-08-29 DIAGNOSIS — E1165 Type 2 diabetes mellitus with hyperglycemia: Secondary | ICD-10-CM

## 2020-08-29 DIAGNOSIS — I129 Hypertensive chronic kidney disease with stage 1 through stage 4 chronic kidney disease, or unspecified chronic kidney disease: Secondary | ICD-10-CM

## 2020-08-29 DIAGNOSIS — N184 Chronic kidney disease, stage 4 (severe): Secondary | ICD-10-CM

## 2020-08-29 DIAGNOSIS — E1122 Type 2 diabetes mellitus with diabetic chronic kidney disease: Secondary | ICD-10-CM

## 2020-08-29 DIAGNOSIS — Z1211 Encounter for screening for malignant neoplasm of colon: Secondary | ICD-10-CM

## 2020-08-29 DIAGNOSIS — Z23 Encounter for immunization: Secondary | ICD-10-CM

## 2020-08-29 LAB — POCT GLYCOSYLATED HEMOGLOBIN (HGB A1C): HbA1c, POC (controlled diabetic range): 6.9 % (ref 0.0–7.0)

## 2020-08-29 LAB — GLUCOSE, POCT (MANUAL RESULT ENTRY): POC Glucose: 127 mg/dl — AB (ref 70–99)

## 2020-08-29 MED ORDER — GLIPIZIDE 5 MG PO TABS: 2.5000 mg | ORAL_TABLET | Freq: Two times a day (BID) | ORAL | 6 refills | Status: DC

## 2020-08-29 MED ORDER — OMEPRAZOLE 40 MG PO CPDR: 40.0000 mg | DELAYED_RELEASE_CAPSULE | Freq: Every day | ORAL | 6 refills | Status: DC

## 2020-08-29 MED ORDER — ATORVASTATIN CALCIUM 80 MG PO TABS: 80.0000 mg | ORAL_TABLET | Freq: Every day | ORAL | 6 refills | Status: DC

## 2020-08-29 MED ORDER — AMLODIPINE BESYLATE 10 MG PO TABS: 10.0000 mg | ORAL_TABLET | Freq: Every day | ORAL | 6 refills | Status: DC

## 2020-08-29 MED ORDER — CARVEDILOL 12.5 MG PO TABS: 12.5000 mg | ORAL_TABLET | Freq: Two times a day (BID) | ORAL | 6 refills | Status: DC

## 2020-08-29 MED ORDER — FUROSEMIDE 80 MG PO TABS
80.0000 mg | ORAL_TABLET | Freq: Every day | ORAL | 6 refills | Status: DC
Start: 2020-08-29 — End: 2021-02-28

## 2020-08-29 MED ORDER — HYDRALAZINE HCL 50 MG PO TABS: 50.0000 mg | ORAL_TABLET | Freq: Three times a day (TID) | ORAL | 6 refills | Status: DC

## 2020-08-29 MED ORDER — SODIUM BICARBONATE 650 MG PO TABS: 1300.0000 mg | ORAL_TABLET | Freq: Two times a day (BID) | ORAL | 3 refills | Status: DC

## 2020-08-29 NOTE — Progress Notes (Signed)
Subjective:  Patient ID: Tony Schmitt, male    DOB: 04/21/1971  Age: 50 y.o. MRN: [id number]  CC: Follow-up (Patient is here for 3 months follow up. )   HPI Tony Schmitt  is a 50 year old male with a history of hypertension, stage IV CKD, type 2 diabetes mellitus (A1c 6.9),acute/subacute nonhemorrhagic infarct in the posterior right corona radiata and anterior aspect of left internal capsule with residual left hemiparesis who presents for a follow up visit. Last seen by Dr. Posey Pronto of rehab medicine last month.  He currently wears a left forearm brace due to left extremity weakness.  He ambulates with a cane. Seen by cardiology in 05/2020 for evaluation for loop recorder however loop recorder is yet to be placed.  Fasting blood sugars are around 120-127 and he denies presence of hypoglycemia, numbness in extremities or visual concerns. His blood pressure is elevated and he endorses compliance with his antihypertensive.  Currently followed by [facility]. Past Medical History:  Diagnosis Date  . Diabetes mellitus without complication ([place])   . Hypertension     Past Surgical History:  Procedure Laterality Date  . BUBBLE STUDY  04/18/2020   Procedure: BUBBLE STUDY;  Surgeon: Sueanne Margarita, MD;  Location: [place];  Service: Cardiovascular;;  . TEE WITHOUT CARDIOVERSION N/A 04/18/2020   Procedure: TRANSESOPHAGEAL ECHOCARDIOGRAM (TEE);  Surgeon: Sueanne Margarita, MD;  Location: [facility] ENDOSCOPY;  Service: Cardiovascular;  Laterality: N/A;    Family History  Problem Relation Age of Onset  . Diabetes Mother   . Diabetes Father   . Hypertension Father   . Cancer Father   . Heart failure Other     Allergies  Allergen Reactions  . Latex Itching    Outpatient Medications Prior to Visit  Medication Sig Dispense Refill  . acetaminophen (TYLENOL) 325 MG tablet Take 2 tablets (650 mg total) by mouth every 4 (four) hours as needed for mild pain (or temp > 37.5 C (99.5  F)).    Marland Kitchen amLODipine (NORVASC) 10 MG tablet Take 1 tablet (10 mg total) by mouth daily. 30 tablet 3  . aspirin 81 MG chewable tablet Chew 1 tablet (81 mg total) by mouth daily. (Patient taking differently: Chew 81 mg by mouth at bedtime.) 30 tablet 0  . atorvastatin (LIPITOR) 80 MG tablet Take 1 tablet (80 mg total) by mouth at bedtime. 30 tablet 3  . carvedilol (COREG) 12.5 MG tablet Take 1 tablet (12.5 mg total) by mouth 2 (two) times daily with a meal. 60 tablet 3  . furosemide (LASIX) 80 MG tablet Take 1 tablet by mouth once daily 30 tablet 0  . glipiZIDE (GLUCOTROL) 5 MG tablet Take 0.5 tablets (2.5 mg total) by mouth 2 (two) times daily before a meal. 60 tablet 3  . hydrALAZINE (APRESOLINE) 25 MG tablet Take 1 tablet (25 mg total) by mouth 3 (three) times daily. 90 tablet 3  . omeprazole (PRILOSEC) 40 MG capsule Take 1 capsule (40 mg total) by mouth daily. 30 capsule 3  . polyethylene glycol (MIRALAX / GLYCOLAX) 17 g packet Take 17 g by mouth 2 (two) times daily. (Patient taking differently: Take 17 g by mouth daily as needed for mild constipation (MIX AND DRINK).) 14 each 0  . senna-docusate (SENOKOT-S) 8.6-50 MG tablet Take 2 tablets by mouth at bedtime.    . sodium bicarbonate 650 MG tablet Take 2 tablets by mouth twice daily 120 tablet 0  . Vitamin D, Ergocalciferol, (DRISDOL) 1.25 MG (  50000 UNIT) CAPS capsule Take 1 capsule (50,000 Units total) by mouth every 7 (seven) days. (Patient taking differently: Take 50,000 Units by mouth every Wednesday.) 4 capsule 2   No facility-administered medications prior to visit.     ROS Review of Systems  Constitutional: Negative for activity change and appetite change.  HENT: Negative for sinus pressure and sore throat.   Eyes: Negative for visual disturbance.  Respiratory: Negative for cough, chest tightness and shortness of breath.   Cardiovascular: Negative for chest pain and leg swelling.  Gastrointestinal: Negative for abdominal distention,  abdominal pain, constipation and diarrhea.  Endocrine: Negative.   Genitourinary: Negative for dysuria.  Musculoskeletal: Negative for joint swelling and myalgias.  Skin: Negative for rash.  Allergic/Immunologic: Negative.   Neurological: Positive for weakness. Negative for light-headedness and numbness.  Psychiatric/Behavioral: Negative for dysphoric mood and suicidal ideas.    Objective:  BP (!) 160/71 (BP Location: Right Arm, Patient Position: Sitting, Cuff Size: Normal)   Pulse 83   Temp 98.7 F (37.1 C) (Oral)   Ht '5\' 4"'$  (1.626 m)   Wt 170 lb (77.1 kg)   SpO2 97%   BMI 29.18 kg/m   BP/Weight 08/29/2020 07/07/2020 [id number]  Systolic BP 0000000 [id number]  Diastolic BP 71 93 82  Wt. (Lbs) 170 174.8 -  BMI 29.18 30 -      Physical Exam Constitutional:      Appearance: He is well-developed.  Neck:     Vascular: No JVD.  Cardiovascular:     Rate and Rhythm: Normal rate.     Heart sounds: Normal heart sounds. No murmur heard.   Pulmonary:     Effort: Pulmonary effort is normal.     Breath sounds: Normal breath sounds. No wheezing or rales.  Chest:     Chest wall: No tenderness.  Abdominal:     General: Bowel sounds are normal. There is no distension.     Palpations: Abdomen is soft. There is no mass.     Tenderness: There is no abdominal tenderness.  Musculoskeletal:     Right lower leg: No edema.     Left lower leg: No edema.     Comments: Left forearm weakness  Neurological:     Mental Status: He is alert and oriented to person, place, and time.  Psychiatric:        Mood and Affect: Mood normal.     CMP Latest Ref Rng & Units 06/14/2020 06/13/2020 06/12/2020  Glucose 70 - 99 mg/dL 152(H) 162(H) 149(H)  BUN 6 - 20 mg/dL 34(H) 30(H) 26(H)  Creatinine 0.61 - 1.24 mg/dL 3.44(H) 3.49(H) 3.37(H)  Sodium 135 - 145 mmol/L 139 140 142  Potassium 3.5 - 5.1 mmol/L 3.8 3.8 3.6  Chloride 98 - 111 mmol/L 106 107 110  CO2 22 - 32 mmol/L '25 25 24  '$ Calcium 8.9 - 10.3 mg/dL  9.1 9.4 9.2  Total Protein 6.5 - 8.1 g/dL - - -  Total Bilirubin 0.3 - 1.2 mg/dL - - -  Alkaline Phos 38 - 126 U/L - - -  AST 15 - 41 U/L - - -  ALT 0 - 44 U/L - - -    Lipid Panel     Component Value Date/Time   CHOL 699 (H) 04/16/2020 0558   CHOL 382 (H) 08/19/2019 1526   TRIG 298 (H) 04/16/2020 0558   HDL 40 (L) 04/16/2020 0558   HDL 116 08/19/2019 1526   CHOLHDL 17.5 04/16/2020 0558   VLDL  60 (H) 04/16/2020 0558   LDLCALC 599 (H) 04/16/2020 0558   LDLCALC 209 (H) 08/19/2019 1526    CBC    Component Value Date/Time   WBC 9.6 06/14/2020 0221   RBC 3.24 (L) 06/14/2020 0221   HGB 8.8 (L) 06/14/2020 0221   HGB 9.8 (L) 06/07/2020 1551   HCT 27.7 (L) 06/14/2020 0221   HCT 30.3 (L) 06/07/2020 1551   PLT 413 (H) 06/14/2020 0221   PLT 471 (H) 06/07/2020 1551   MCV 85.5 06/14/2020 0221   MCV 84 06/07/2020 1551   MCH 27.2 06/14/2020 0221   MCHC 31.8 06/14/2020 0221   RDW 14.8 06/14/2020 0221   RDW 14.1 06/07/2020 1551   LYMPHSABS 2.2 06/11/2020 0707   LYMPHSABS 1.8 06/07/2020 1551   MONOABS 0.8 06/11/2020 0707   EOSABS 0.2 06/11/2020 0707   EOSABS 0.2 06/07/2020 1551   BASOSABS 0.1 06/11/2020 0707   BASOSABS 0.1 06/07/2020 1551    Lab Results  Component Value Date   HGBA1C 6.9 08/29/2020    Assessment & Plan:  1. Type 2 diabetes mellitus with hyperglycemia, without long-term current use of insulin (HCC) Controlled with A1c of 6.9 Continue current regimen Counseled on Diabetic diet, my plate method, [id number] minutes of moderate intensity exercise/week Blood sugar logs with fasting goals of 80-120 mg/dl, random of less than 180 and in the event of sugars less than 60 mg/dl or greater than 400 mg/dl encouraged to notify the clinic. Advised on the need for annual eye exams, annual foot exams, Pneumonia vaccine. - Glucose (CBG) - HgB A1c - atorvastatin (LIPITOR) 80 MG tablet; Take 1 tablet (80 mg total) by mouth at bedtime.  Dispense: 30 tablet; Refill: 6 - glipiZIDE  (GLUCOTROL) 5 MG tablet; Take 0.5 tablets (2.5 mg total) by mouth 2 (two) times daily before a meal.  Dispense: 60 tablet; Refill: 6  2. Colon cancer screening - Fecal occult blood, imunochemical([facility])  3. Hypertension in stage 4 chronic kidney disease due to type 2 diabetes mellitus (HCC) Uncontrolled Hydralazine dose increased - hydrALAZINE (APRESOLINE) 50 MG tablet; Take 1 tablet (50 mg total) by mouth 3 (three) times daily.  Dispense: 90 tablet; Refill: 6 - amLODipine (NORVASC) 10 MG tablet; Take 1 tablet (10 mg total) by mouth daily.  Dispense: 30 tablet; Refill: 6 - carvedilol (COREG) 12.5 MG tablet; Take 1 tablet (12.5 mg total) by mouth 2 (two) times daily with a meal.  Dispense: 60 tablet; Refill: 6 - sodium bicarbonate 650 MG tablet; Take 2 tablets (1,300 mg total) by mouth 2 (two) times daily.  Dispense: 120 tablet; Refill: 3  4. Gastroesophageal reflux disease with esophagitis without hemorrhage Stable - omeprazole (PRILOSEC) 40 MG capsule; Take 1 capsule (40 mg total) by mouth daily.  Dispense: 30 capsule; Refill: 6   No orders of the defined types were placed in this encounter.   Return in about 3 months (around 11/27/2020) for Chronic disease management.       Charlott Rakes, MD, FAAFP. [facility] [place], [place]   08/29/2020, 3:51 PM

## 2020-08-29 NOTE — Patient Instructions (Addendum)
https://www.nhlbi.nih.gov/files/docs/public/heart/dash_brief.pdf">  DASH Eating Plan DASH stands for Dietary Approaches to Stop Hypertension. The DASH eating plan is a healthy eating plan that has been shown to:  Reduce high blood pressure (hypertension).  Reduce your risk for type 2 diabetes, heart disease, and stroke.  Help with weight loss. What are tips for following this plan? Reading food labels  Check food labels for the amount of salt (sodium) per serving. Choose foods with less than 5 percent of the Daily Value of sodium. Generally, foods with less than 300 milligrams (mg) of sodium per serving fit into this eating plan.  To find whole grains, look for the word "whole" as the first word in the ingredient list. Shopping  Buy products labeled as "low-sodium" or "no salt added."  Buy fresh foods. Avoid canned foods and pre-made or frozen meals. Cooking  Avoid adding salt when cooking. Use salt-free seasonings or herbs instead of table salt or sea salt. Check with your health care provider or pharmacist before using salt substitutes.  Do not fry foods. Cook foods using healthy methods such as baking, boiling, grilling, roasting, and broiling instead.  Cook with heart-healthy oils, such as olive, canola, avocado, soybean, or sunflower oil. Meal planning  Eat a balanced diet that includes: ? 4 or more servings of fruits and 4 or more servings of vegetables each day. Try to fill one-half of your plate with fruits and vegetables. ? 6-8 servings of whole grains each day. ? Less than 6 oz (170 g) of lean meat, poultry, or fish each day. A 3-oz (85-g) serving of meat is about the same size as a deck of cards. One egg equals 1 oz (28 g). ? 2-3 servings of low-fat dairy each day. One serving is 1 cup (237 mL). ? 1 serving of nuts, seeds, or beans 5 times each week. ? 2-3 servings of heart-healthy fats. Healthy fats called omega-3 fatty acids are found in foods such as walnuts,  flaxseeds, fortified milks, and eggs. These fats are also found in cold-water fish, such as sardines, salmon, and mackerel.  Limit how much you eat of: ? Canned or prepackaged foods. ? Food that is high in trans fat, such as some fried foods. ? Food that is high in saturated fat, such as fatty meat. ? Desserts and other sweets, sugary drinks, and other foods with added sugar. ? Full-fat dairy products.  Do not salt foods before eating.  Do not eat more than 4 egg yolks a week.  Try to eat at least 2 vegetarian meals a week.  Eat more home-cooked food and less restaurant, buffet, and fast food.   Lifestyle  When eating at a restaurant, ask that your food be prepared with less salt or no salt, if possible.  If you drink alcohol: ? Limit how much you use to:  0-1 drink a day for women who are not pregnant.  0-2 drinks a day for men. ? Be aware of how much alcohol is in your drink. In the [place] one drink equals one 12 oz bottle of beer (355 mL), one 5 oz glass of wine (148 mL), or one 1 oz glass of hard liquor (44 mL). General information  Avoid eating more than 2,300 mg of salt a day. If you have hypertension, you may need to reduce your sodium intake to 1,500 mg a day.  Work with your health care provider to maintain a healthy body weight or to lose weight. Ask what an ideal weight is for   you.  Get at least 30 minutes of exercise that causes your heart to beat faster (aerobic exercise) most days of the week. Activities may include walking, swimming, or biking.  Work with your health care provider or dietitian to adjust your eating plan to your individual calorie needs. What foods should I eat? Fruits All fresh, dried, or frozen fruit. Canned fruit in natural juice (without added sugar). Vegetables Fresh or frozen vegetables (raw, steamed, roasted, or grilled). Low-sodium or reduced-sodium tomato and vegetable juice. Low-sodium or reduced-sodium tomato sauce and tomato paste.  Low-sodium or reduced-sodium canned vegetables. Grains Whole-grain or whole-wheat bread. Whole-grain or whole-wheat pasta. Brown rice. Oatmeal. Quinoa. Bulgur. Whole-grain and low-sodium cereals. Pita bread. Low-fat, low-sodium crackers. Whole-wheat flour tortillas. Meats and other proteins Skinless chicken or turkey. Ground chicken or turkey. Pork with fat trimmed off. Fish and seafood. Egg whites. Dried beans, peas, or lentils. Unsalted nuts, nut butters, and seeds. Unsalted canned beans. Lean cuts of beef with fat trimmed off. Low-sodium, lean precooked or cured meat, such as sausages or meat loaves. Dairy Low-fat (1%) or fat-free (skim) milk. Reduced-fat, low-fat, or fat-free cheeses. Nonfat, low-sodium ricotta or cottage cheese. Low-fat or nonfat yogurt. Low-fat, low-sodium cheese. Fats and oils Soft margarine without trans fats. Vegetable oil. Reduced-fat, low-fat, or light mayonnaise and salad dressings (reduced-sodium). Canola, safflower, olive, avocado, soybean, and sunflower oils. Avocado. Seasonings and condiments Herbs. Spices. Seasoning mixes without salt. Other foods Unsalted popcorn and pretzels. Fat-free sweets. The items listed above may not be a complete list of foods and beverages you can eat. Contact a dietitian for more information. What foods should I avoid? Fruits Canned fruit in a light or heavy syrup. Fried fruit. Fruit in cream or butter sauce. Vegetables Creamed or fried vegetables. Vegetables in a cheese sauce. Regular canned vegetables (not low-sodium or reduced-sodium). Regular canned tomato sauce and paste (not low-sodium or reduced-sodium). Regular tomato and vegetable juice (not low-sodium or reduced-sodium). Pickles. Olives. Grains Baked goods made with fat, such as croissants, muffins, or some breads. Dry pasta or rice meal packs. Meats and other proteins Fatty cuts of meat. Ribs. Fried meat. Bacon. Bologna, salami, and other precooked or cured meats, such as  sausages or meat loaves. Fat from the back of a pig (fatback). Bratwurst. Salted nuts and seeds. Canned beans with added salt. Canned or smoked fish. Whole eggs or egg yolks. Chicken or turkey with skin. Dairy Whole or 2% milk, cream, and half-and-half. Whole or full-fat cream cheese. Whole-fat or sweetened yogurt. Full-fat cheese. Nondairy creamers. Whipped toppings. Processed cheese and cheese spreads. Fats and oils Butter. Stick margarine. Lard. Shortening. Ghee. Bacon fat. Tropical oils, such as coconut, palm kernel, or palm oil. Seasonings and condiments Onion salt, garlic salt, seasoned salt, table salt, and sea salt. Worcestershire sauce. Tartar sauce. Barbecue sauce. Teriyaki sauce. Soy sauce, including reduced-sodium. Steak sauce. Canned and packaged gravies. Fish sauce. Oyster sauce. Cocktail sauce. Store-bought horseradish. Ketchup. Mustard. Meat flavorings and tenderizers. Bouillon cubes. Hot sauces. Pre-made or packaged marinades. Pre-made or packaged taco seasonings. Relishes. Regular salad dressings. Other foods Salted popcorn and pretzels. The items listed above may not be a complete list of foods and beverages you should avoid. Contact a dietitian for more information. Where to find more information  National Heart, Lung, and Blood Institute: www.nhlbi.nih.gov  American Heart Association: www.heart.org  Academy of Nutrition and Dietetics: www.eatright.org  National Kidney Foundation: www.kidney.org Summary  The DASH eating plan is a healthy eating plan that has been shown to   reduce high blood pressure (hypertension). It may also reduce your risk for type 2 diabetes, heart disease, and stroke.  When on the DASH eating plan, aim to eat more fresh fruits and vegetables, whole grains, lean proteins, low-fat dairy, and heart-healthy fats.  With the DASH eating plan, you should limit salt (sodium) intake to 2,300 mg a day. If you have hypertension, you may need to reduce your  sodium intake to 1,500 mg a day.  Work with your health care provider or dietitian to adjust your eating plan to your individual calorie needs. This information is not intended to replace advice given to you by your health care provider. Make sure you discuss any questions you have with your health care provider. Document Revised: 06/26/2019 Document Reviewed: 06/26/2019 Elsevier Patient Education  2021 [place].   Pneumococcal Polysaccharide Vaccine (PPSV23): What You Need to Know 1. Why get vaccinated? Pneumococcal polysaccharide vaccine (PPSV23) can prevent pneumococcal disease. Pneumococcal disease refers to any illness caused by pneumococcal bacteria. These bacteria can cause many types of illnesses, including pneumonia, which is an infection of the lungs. Pneumococcal bacteria are one of the most common causes of pneumonia. Besides pneumonia, pneumococcal bacteria can also cause:  Ear infections  Sinus infections  Meningitis (infection of the tissue covering the brain and spinal cord)  Bacteremia (bloodstream infection) Anyone can get pneumococcal disease, but children under 6 years of age, people with certain medical conditions, adults 29 years or older, and cigarette smokers are at the highest risk. Most pneumococcal infections are mild. However, some can result in long-term problems, such as brain damage or hearing loss. Meningitis, bacteremia, and pneumonia caused by pneumococcal disease can be fatal. 2. PPSV23 PPSV23 protects against 23 types of bacteria that cause pneumococcal disease. PPSV23 is recommended for:  All adults 34 years or older,  Anyone 2 years or older with certain medical conditions that can lead to an increased risk for pneumococcal disease. Most people need only one dose of PPSV23. A second dose of PPSV23, and another type of pneumococcal vaccine called PCV13, are recommended for certain high-risk groups. Your health care provider can give you more  information. People 65 years or older should get a dose of PPSV23 even if they have already gotten one or more doses of the vaccine before they turned 55. 3. Talk with your health care provider Tell your vaccine provider if the person getting the vaccine:  Has had an allergic reaction after a previous dose of PPSV23, or has any severe, life-threatening allergies. In some cases, your health care provider may decide to postpone PPSV23 vaccination to a future visit. People with minor illnesses, such as a cold, may be vaccinated. People who are moderately or severely ill should usually wait until they recover before getting PPSV23. Your health care provider can give you more information. 4. Risks of a vaccine reaction  Redness or pain where the shot is given, feeling tired, fever, or muscle aches can happen after PPSV23. People sometimes faint after medical procedures, including vaccination. Tell your provider if you feel dizzy or have vision changes or ringing in the ears. As with any medicine, there is a very remote chance of a vaccine causing a severe allergic reaction, other serious injury, or death. 5. What if there is a serious problem? An allergic reaction could occur after the vaccinated person leaves the clinic. If you see signs of a severe allergic reaction (hives, swelling of the face and throat, difficulty breathing, a fast heartbeat, dizziness,  or weakness), call 9-1-1 and get the person to the nearest hospital. For other signs that concern you, call your health care provider. Adverse reactions should be reported to the Vaccine Adverse Event Reporting System (VAERS). Your health care provider will usually file this report, or you can do it yourself. Visit the VAERS website at www.vaers.SamedayNews.es or call (660)528-7643. VAERS is only for reporting reactions, and VAERS staff do not give medical advice. 6. How can I learn more?  Ask your health care provider.  Call your local or state  health department.  Contact the Centers for Disease Control and Prevention (CDC): ? Call 5618187829 (1-800-CDC-INFO) or ? Visit CDC's website at http://hunter.com/ Vaccine Information Statement PPSV23 Vaccine (06/04/2018) This information is not intended to replace advice given to you by your health care provider. Make sure you discuss any questions you have with your health care provider. Document Revised: 03/25/2020 Document Reviewed: 03/25/2020 Elsevier Patient Education  [place].

## 2020-08-29 NOTE — Progress Notes (Signed)
Patient presents for vaccination against strep pneumo per orders of Dr. Margarita Rana. Consent given. Counseling provided. No contraindications exists. Vaccine administered without incident.   Benard Halsted, PharmD, [place], [place] [phone]

## 2020-08-30 ENCOUNTER — Other Ambulatory Visit: Payer: Self-pay | Admitting: Family Medicine

## 2020-08-30 DIAGNOSIS — E559 Vitamin D deficiency, unspecified: Secondary | ICD-10-CM

## 2020-08-30 NOTE — Telephone Encounter (Signed)
Medication Refill - Medication: Vitamin D, Ergocalciferol, (DRISDOL) 1.25 MG (50000 UNIT) CAPS capsule    Has the patient contacted their pharmacy? Yes.   (Agent: If no, request that the patient contact the pharmacy for the refill.) (Agent: If yes, when and what did the pharmacy advise?)  Preferred Pharmacy (with phone number or street name):  [facility] ([address]), [place] - [address]  [id number] [place] ([place]) [place] [address]  Phone: [phone] Fax: [phone]     Agent: Please be advised that RX refills may take up to 3 business days. We ask that you follow-up with your pharmacy.

## 2020-08-30 NOTE — Telephone Encounter (Signed)
Requested medication (s) are due for refill today: Yes  Requested medication (s) are on the active medication list: Yes  Last refill:  06/2020  Future visit scheduled:Yes  Notes to clinic: Unable to refill per protocol, cannot delegate     Requested Prescriptions  Pending Prescriptions Disp Refills   Vitamin D, Ergocalciferol, (DRISDOL) 1.25 MG (50000 UNIT) CAPS capsule 4 capsule 2    Sig: Take 1 capsule (50,000 Units total) by mouth every 7 (seven) days.      Endocrinology:  Vitamins - Vitamin D Supplementation Failed - 08/30/2020  4:18 PM      Failed - 50,000 IU strengths are not delegated      Failed - Vitamin D in normal range and within 360 days    Vit D, 25-Hydroxy  Date Value Ref Range Status  04/27/2020 5.39 (L) 30 - 100 ng/mL Final    Comment:    (NOTE) Vitamin D deficiency has been defined by the [employer] practice guideline as a level of serum 25-OH  vitamin D less than 20 ng/mL (1,2). The Endocrine Society went on to  further define vitamin D insufficiency as a level between 21 and 29  ng/mL (2).  1. IOM (Institute of Medicine). 2010. Dietary reference intakes for  calcium and D. [place]: The [employer]. 2. Holick MF, Binkley Oliver, Bischoff-Ferrari HA, et al. Evaluation,  treatment, and prevention of vitamin D deficiency: an Endocrine  Society clinical practice guideline, JCEM. 2011 Jul; 96(7): 1911-30.  Performed at [facility], [place] [address], [place],  [address]           Passed - Ca in normal range and within 360 days    Calcium  Date Value Ref Range Status  06/14/2020 9.1 8.9 - 10.3 mg/dL Final   Calcium, Ion  Date Value Ref Range Status  04/16/2020 1.30 1.15 - 1.40 mmol/L Final          Passed - Phosphate in normal range and within 360 days    Phosphorus  Date Value Ref Range Status  06/14/2020 3.9 2.5 - 4.6 mg/dL Final          Passed - Valid encounter within last 12  months    Recent Outpatient Visits           Yesterday Need for vaccination against Streptococcus pneumoniae   [place], Jarome Matin, RPH-CPP   Yesterday Type 2 diabetes mellitus with hyperglycemia, without long-term current use of insulin ([place])   [place], [place], MD   3 months ago Hemiparesis affecting left side as late effect of cerebrovascular accident [facility])   [place], [place], MD   1 year ago Hypertension associated with type 2 diabetes mellitus [facility])   [facility] [place], [place], MD   1 year ago Hypertension associated with type 2 diabetes mellitus [facility])   [facility] at [facility], Ines [place], MD       Future Appointments             In 3 months Charlott Rakes, MD [place]

## 2020-08-31 MED ORDER — VITAMIN D (ERGOCALCIFEROL) 1.25 MG (50000 UNIT) PO CAPS: 50000.0000 [IU] | ORAL_CAPSULE | ORAL | 2 refills | Status: DC

## 2020-10-09 ENCOUNTER — Other Ambulatory Visit: Payer: Self-pay | Admitting: Family Medicine

## 2020-10-09 DIAGNOSIS — K2100 Gastro-esophageal reflux disease with esophagitis, without bleeding: Secondary | ICD-10-CM

## 2020-10-13 NOTE — Telephone Encounter (Addendum)
Medication Refill - Medication: omeprazole (PRILOSEC) 40 MG capsule, patient has 2 pills and will run out   Has the patient contacted their pharmacy? Yes.    (Agent: If yes, when and what did the pharmacy advise?) to contact PCP office due to no refills  Preferred Pharmacy (with phone number or street name):    [facility]), [place] - [place] Phone:  [phone]  Fax:  [phone]       Agent: Please be advised that RX refills may take up to 3 business days. We ask that you follow-up with your pharmacy.

## 2020-10-13 NOTE — Telephone Encounter (Signed)
Call to pharmacy- they have just received a call from [place]- regarding patient and medication- they are filling it now.

## 2020-10-13 NOTE — Addendum Note (Signed)
Addended by: Jefferson Fuel on: 10/13/2020 03:25 PM   Modules accepted: Orders

## 2020-11-28 ENCOUNTER — Ambulatory Visit: Payer: Self-pay | Attending: Family Medicine | Admitting: Family Medicine

## 2020-11-28 ENCOUNTER — Encounter: Payer: Self-pay | Admitting: Family Medicine

## 2020-11-28 ENCOUNTER — Other Ambulatory Visit: Payer: Self-pay

## 2020-11-28 VITALS — BP 160/80 | HR 74 | Resp 18 | Ht 65.0 in | Wt 171.4 lb

## 2020-11-28 DIAGNOSIS — I129 Hypertensive chronic kidney disease with stage 1 through stage 4 chronic kidney disease, or unspecified chronic kidney disease: Secondary | ICD-10-CM

## 2020-11-28 DIAGNOSIS — E1122 Type 2 diabetes mellitus with diabetic chronic kidney disease: Secondary | ICD-10-CM

## 2020-11-28 DIAGNOSIS — I69354 Hemiplegia and hemiparesis following cerebral infarction affecting left non-dominant side: Secondary | ICD-10-CM

## 2020-11-28 DIAGNOSIS — N184 Chronic kidney disease, stage 4 (severe): Secondary | ICD-10-CM

## 2020-11-28 DIAGNOSIS — E1165 Type 2 diabetes mellitus with hyperglycemia: Secondary | ICD-10-CM

## 2020-11-28 LAB — GLUCOSE, POCT (MANUAL RESULT ENTRY): POC Glucose: 159 mg/dl — AB (ref 70–99)

## 2020-11-28 LAB — POCT GLYCOSYLATED HEMOGLOBIN (HGB A1C): HbA1c, POC (controlled diabetic range): 7.6 % — AB (ref 0.0–7.0)

## 2020-11-28 MED ORDER — CARVEDILOL 25 MG PO TABS: 25.0000 mg | ORAL_TABLET | Freq: Two times a day (BID) | ORAL | 6 refills | Status: DC

## 2020-11-28 MED ORDER — GLIPIZIDE 5 MG PO TABS: 5.0000 mg | ORAL_TABLET | Freq: Two times a day (BID) | ORAL | 6 refills | Status: DC

## 2020-11-28 NOTE — Progress Notes (Signed)
Subjective:  Patient ID: Yeshua Clermont Mcqueary, male    DOB: 03-Nov-1970  Age: 50 y.o. 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 A1c is 7.6 up from 6.9 previously and he endorses compliance with glipizide 2.5 mg twice daily.  He has no blurry vision or numbness in extremities. Followed by Nephrology and is being worked up for an AV fistula. He does have some swelling in his left upper extremity which is also weak and he admits to me he has not been elevating it like he should. He denies additional concerns at this time.  Past Medical History:  Diagnosis Date  . Diabetes mellitus without complication ([place])   . Hypertension     Past Surgical History:  Procedure Laterality Date  . BUBBLE STUDY  04/18/2020   Procedure: BUBBLE STUDY;  Surgeon: Sueanne Margarita, MD;  Location: [place];  Service: Cardiovascular;;  . TEE WITHOUT CARDIOVERSION N/A 04/18/2020   Procedure: TRANSESOPHAGEAL ECHOCARDIOGRAM (TEE);  Surgeon: Sueanne Margarita, MD;  Location: [facility] ENDOSCOPY;  Service: Cardiovascular;  Laterality: N/A;    Family History  Problem Relation Age of Onset  . Diabetes Mother   . Diabetes Father   . Hypertension Father   . Cancer Father   . Heart failure Other     Allergies  Allergen Reactions  . Latex Itching    Outpatient Medications Prior to Visit  Medication Sig Dispense Refill  . acetaminophen (TYLENOL) 325 MG tablet Take 2 tablets (650 mg total) by mouth every 4 (four) hours as needed for mild pain (or temp > 37.5 C (99.5 F)).    Marland Kitchen amLODipine (NORVASC) 10 MG tablet Take 1 tablet (10 mg total) by mouth daily. 30 tablet 6  . aspirin 81 MG chewable tablet Chew 1 tablet (81 mg total) by mouth  daily. (Patient taking differently: Chew 81 mg by mouth at bedtime.) 30 tablet 0  . atorvastatin (LIPITOR) 80 MG tablet Take 1 tablet (80 mg total) by mouth at bedtime. 30 tablet 6  . furosemide (LASIX) 80 MG tablet Take 1 tablet (80 mg total) by mouth daily. 30 tablet 6  . hydrALAZINE (APRESOLINE) 50 MG tablet Take 1 tablet (50 mg total) by mouth 3 (three) times daily. 90 tablet 6  . omeprazole (PRILOSEC) 40 MG capsule Take 1 capsule (40 mg total) by mouth daily. 30 capsule 6  . polyethylene glycol (MIRALAX / GLYCOLAX) 17 g packet Take 17 g by mouth 2 (two) times daily. (Patient taking differently: Take 17 g by mouth daily as needed for mild constipation (MIX AND DRINK).) 14 each 0  . senna-docusate (SENOKOT-S) 8.6-50 MG tablet Take 2 tablets by mouth at bedtime.    . sodium bicarbonate 650 MG tablet Take 2 tablets (1,300 mg total) by mouth 2 (two) times daily. 120 tablet 3  . Vitamin D, Ergocalciferol, (DRISDOL) 1.25 MG (50000 UNIT) CAPS capsule Take 1 capsule (50,000 Units total) by mouth every 7 (seven) days. 4 capsule 2  . carvedilol (COREG) 12.5 MG tablet Take 1 tablet (12.5 mg total) by mouth 2 (two) times daily with a meal. 60 tablet 6  . glipiZIDE (GLUCOTROL) 5 MG tablet Take 0.5 tablets (2.5 mg total) by mouth 2 (two) times daily  before a meal. 60 tablet 6   No facility-administered medications prior to visit.     ROS Review of Systems  Constitutional: Negative for activity change and appetite change.  HENT: Negative for sinus pressure and sore throat.   Eyes: Negative for visual disturbance.  Respiratory: Negative for cough, chest tightness and shortness of breath.   Cardiovascular: Negative for chest pain and leg swelling.  Gastrointestinal: Negative for abdominal distention, abdominal pain, constipation and diarrhea.  Endocrine: Negative.   Genitourinary: Negative for dysuria.  Musculoskeletal: Negative for joint swelling and myalgias.  Skin: Negative for rash.   Allergic/Immunologic: Negative.   Neurological: Negative for weakness, light-headedness and numbness.  Psychiatric/Behavioral: Negative for dysphoric mood and suicidal ideas.    Objective:  BP (!) 160/80   Pulse 74   Resp 18   Ht '5\' 5"'$  (1.651 m)   Wt 171 lb 6.4 oz (77.7 kg)   SpO2 97%   BMI 28.52 kg/m   BP/Weight 11/28/2020 08/29/2020 [id number]  Systolic BP 0000000 0000000 [id number]  Diastolic BP 80 71 93  Wt. (Lbs) 171.4 170 174.8  BMI 28.52 29.18 30      Physical Exam Constitutional:      Appearance: He is well-developed.  Neck:     Vascular: No JVD.  Cardiovascular:     Rate and Rhythm: Normal rate.     Heart sounds: Normal heart sounds. No murmur heard.   Pulmonary:     Effort: Pulmonary effort is normal.     Breath sounds: Normal breath sounds. No wheezing or rales.  Chest:     Chest wall: No tenderness.  Abdominal:     General: Bowel sounds are normal. There is no distension.     Palpations: Abdomen is soft. There is no mass.     Tenderness: There is no abdominal tenderness.  Musculoskeletal:        General: Swelling (L dorsum) present.     Right lower leg: No edema.     Left lower leg: No edema.     Comments: Left upper extremity weakness  Neurological:     Mental Status: He is alert and oriented to person, place, and time.  Psychiatric:        Mood and Affect: Mood normal.     CMP Latest Ref Rng & Units 06/14/2020 06/13/2020 06/12/2020  Glucose 70 - 99 mg/dL 152(H) 162(H) 149(H)  BUN 6 - 20 mg/dL 34(H) 30(H) 26(H)  Creatinine 0.61 - 1.24 mg/dL 3.44(H) 3.49(H) 3.37(H)  Sodium 135 - 145 mmol/L 139 140 142  Potassium 3.5 - 5.1 mmol/L 3.8 3.8 3.6  Chloride 98 - 111 mmol/L 106 107 110  CO2 22 - 32 mmol/L '25 25 24  '$ Calcium 8.9 - 10.3 mg/dL 9.1 9.4 9.2  Total Protein 6.5 - 8.1 g/dL - - -  Total Bilirubin 0.3 - 1.2 mg/dL - - -  Alkaline Phos 38 - 126 U/L - - -  AST 15 - 41 U/L - - -  ALT 0 - 44 U/L - - -    Lipid Panel     Component Value Date/Time   CHOL 699  (H) 04/16/2020 0558   CHOL 382 (H) 08/19/2019 1526   TRIG 298 (H) 04/16/2020 0558   HDL 40 (L) 04/16/2020 0558   HDL 116 08/19/2019 1526   CHOLHDL 17.5 04/16/2020 0558   VLDL 60 (H) 04/16/2020 0558   LDLCALC 599 (H) 04/16/2020 0558   LDLCALC 209 (H) 08/19/2019 1526    CBC  Component Value Date/Time   WBC 9.6 06/14/2020 0221   RBC 3.24 (L) 06/14/2020 0221   HGB 8.8 (L) 06/14/2020 0221   HGB 9.8 (L) 06/07/2020 1551   HCT 27.7 (L) 06/14/2020 0221   HCT 30.3 (L) 06/07/2020 1551   PLT 413 (H) 06/14/2020 0221   PLT 471 (H) 06/07/2020 1551   MCV 85.5 06/14/2020 0221   MCV 84 06/07/2020 1551   MCH 27.2 06/14/2020 0221   MCHC 31.8 06/14/2020 0221   RDW 14.8 06/14/2020 0221   RDW 14.1 06/07/2020 1551   LYMPHSABS 2.2 06/11/2020 0707   LYMPHSABS 1.8 06/07/2020 1551   MONOABS 0.8 06/11/2020 0707   EOSABS 0.2 06/11/2020 0707   EOSABS 0.2 06/07/2020 1551   BASOSABS 0.1 06/11/2020 0707   BASOSABS 0.1 06/07/2020 1551    Lab Results  Component Value Date   HGBA1C 7.6 (A) 11/28/2020    Assessment & Plan:  1. Type 2 diabetes mellitus with hyperglycemia, without long-term current use of insulin (HCC) A1c of 7.6 which has trended up from 6.9 previously Increase glipizide dose from 2.5 mg twice daily to 5 mg twice daily Counseled on Diabetic diet, my plate method, [id number] minutes of moderate intensity exercise/week Blood sugar logs with fasting goals of 80-120 mg/dl, random of less than 180 and in the event of sugars less than 60 mg/dl or greater than 400 mg/dl encouraged to notify the clinic. Advised on the need for annual eye exams, annual foot exams, Pneumonia vaccine. - POCT glucose (manual entry) - POCT glycosylated hemoglobin (Hb A1C) - glipiZIDE (GLUCOTROL) 5 MG tablet; Take 1 tablet (5 mg total) by mouth 2 (two) times daily before a meal.  Dispense: 60 tablet; Refill: 6  2. Hypertension in stage 4 chronic kidney disease due to type 2 diabetes mellitus ([place]) Uncontrolled blood  pressure Carvedilol dose increased Follow-up with nephrology for CKD - carvedilol (COREG) 25 MG tablet; Take 1 tablet (25 mg total) by mouth 2 (two) times daily with a meal.  Dispense: 60 tablet; Refill: 6  3. Hemiparesis affecting left side as late effect of cerebrovascular accident [facility]) He does have weakness of his left upper extremity along with left hand dorsal edema Advised to elevate left upper extremity Risk factor modification for CVA    Meds ordered this encounter  Medications  . glipiZIDE (GLUCOTROL) 5 MG tablet    Sig: Take 1 tablet (5 mg total) by mouth 2 (two) times daily before a meal.    Dispense:  60 tablet    Refill:  6    Dose change  . carvedilol (COREG) 25 MG tablet    Sig: Take 1 tablet (25 mg total) by mouth 2 (two) times daily with a meal.    Dispense:  60 tablet    Refill:  6    Dose increase    Follow-up: Return in about 3 months (around 02/27/2021) for Medical conditions.       Charlott Rakes, MD, FAAFP. [facility] and [place], [place]   11/28/2020, 2:07 PM

## 2020-12-09 ENCOUNTER — Other Ambulatory Visit: Payer: Self-pay | Admitting: *Deleted

## 2020-12-09 DIAGNOSIS — N186 End stage renal disease: Secondary | ICD-10-CM

## 2020-12-16 ENCOUNTER — Encounter: Payer: 59 | Admitting: Vascular Surgery

## 2020-12-16 ENCOUNTER — Other Ambulatory Visit (HOSPITAL_COMMUNITY): Payer: 59

## 2020-12-16 ENCOUNTER — Encounter (HOSPITAL_COMMUNITY): Payer: 59

## 2020-12-21 ENCOUNTER — Ambulatory Visit (HOSPITAL_COMMUNITY)
Admission: RE | Admit: 2020-12-21 | Discharge: 2020-12-21 | Disposition: A | Discharge status: Home / Self Care | Payer: Medicaid Other | Source: Ambulatory Visit | Attending: Vascular Surgery | Admitting: Vascular Surgery

## 2020-12-21 ENCOUNTER — Ambulatory Visit (INDEPENDENT_AMBULATORY_CARE_PROVIDER_SITE_OTHER): Payer: Self-pay | Admitting: Vascular Surgery

## 2020-12-21 ENCOUNTER — Other Ambulatory Visit: Payer: Self-pay

## 2020-12-21 ENCOUNTER — Encounter: Payer: Self-pay | Admitting: Vascular Surgery

## 2020-12-21 VITALS — BP 143/70 | HR 78 | Temp 98.2°F | Resp 20 | Ht 65.0 in | Wt 171.0 lb

## 2020-12-21 DIAGNOSIS — N186 End stage renal disease: Secondary | ICD-10-CM | POA: Diagnosis not present

## 2020-12-21 DIAGNOSIS — N184 Chronic kidney disease, stage 4 (severe): Secondary | ICD-10-CM

## 2020-12-21 NOTE — Progress Notes (Signed)
REASON FOR CONSULT:    To evaluate for hemodialysis access.  The consult is requested by Dr. Marval Regal.  ASSESSMENT & PLAN:   STAGE IV CHRONIC KIDNEY DISEASE: This patient has stage IV chronic kidney disease.  He is referred for hemodialysis access.  We were asked not to place an AV graft if an AV fistula or not possible.  Based on his vein map and my own SonoSite exam, it looks like he is not a candidate for an AV fistula and therefore we will wait before placing an AV graft.  He has had a right hemispheric stroke associated with left-sided weakness and it looks like it would be difficult for him to position his left arm for dialysis.  Therefore he would require access in the right arm and cephalic vein and basilic vein on the right side do not appear adequate.  The veins do not look much better on the left side either.  Therefore if his kidney function worsens we can certainly schedule him for placement of a right arm AV graft.  I have discussed the indications for the procedure and the potential complications with the patient today in the office.   Tony Mayo, MD Office: [phone]   HPI:   Tony Schmitt is a pleasant 50 y.o. male, who was referred for hemodialysis access.  I have reviewed the records from the referring office.  We were asked to place an AV fistula.  If this were not possible we were asked to not place an AV graft.  The patient was seen on 11/09/2020 with stage IV chronic kidney disease.  This is secondary to diabetes and hypertension.  The patient has had a previous episode of acute renal failure.  On my history the patient denies any uremic symptoms.  Specifically, he denies nausea, vomiting, fatigue, anorexia, or palpitations.  He is right-handed.  He has had a previous right hemispheric stroke and has significant weakness in the left arm and left lower extremity.  He has stage IV chronic kidney disease.  He does not have a pacemaker.  He is not on blood  thinners.  Past Medical History:  Diagnosis Date  . Chronic kidney disease   . Diabetes mellitus without complication ([place])   . Hypertension   . Stroke [facility])     Family History  Problem Relation Age of Onset  . Diabetes Mother   . Diabetes Father   . Hypertension Father   . Cancer Father   . Heart failure Other     SOCIAL HISTORY: Social History   Socioeconomic History  . Marital status: Single    Spouse name: Not on file  . Number of children: Not on file  . Years of education: Not on file  . Highest education level: Not on file  Occupational History  . Not on file  Tobacco Use  . Smoking status: Former Smoker    Types: Cigarettes, Cigars  . Smokeless tobacco: Never Used  Vaping Use  . Vaping Use: Never used  Substance and Sexual Activity  . Alcohol use: Not Currently  . Drug use: Never  . Sexual activity: Not on file  Other Topics Concern  . Not on file  Social History Narrative  . Not on file   Social Determinants of Health   Financial Resource Strain: Not on file  Food Insecurity: Not on file  Transportation Needs: Not on file  Physical Activity: Not on file  Stress: Not on file  Social Connections: Not on  file  Intimate Partner Violence: Not on file    Allergies  Allergen Reactions  . Latex Itching    Current Outpatient Medications  Medication Sig Dispense Refill  . acetaminophen (TYLENOL) 325 MG tablet Take 2 tablets (650 mg total) by mouth every 4 (four) hours as needed for mild pain (or temp > 37.5 C (99.5 F)).    Marland Kitchen amLODipine (NORVASC) 10 MG tablet Take 1 tablet (10 mg total) by mouth daily. 30 tablet 6  . aspirin 81 MG chewable tablet Chew 1 tablet (81 mg total) by mouth daily. (Patient taking differently: Chew 81 mg by mouth at bedtime.) 30 tablet 0  . atorvastatin (LIPITOR) 80 MG tablet Take 1 tablet (80 mg total) by mouth at bedtime. 30 tablet 6  . carvedilol (COREG) 25 MG tablet Take 1 tablet (25 mg total) by mouth 2 (two) times daily  with a meal. 60 tablet 6  . furosemide (LASIX) 80 MG tablet Take 1 tablet (80 mg total) by mouth daily. 30 tablet 6  . glipiZIDE (GLUCOTROL) 5 MG tablet Take 1 tablet (5 mg total) by mouth 2 (two) times daily before a meal. 60 tablet 6  . hydrALAZINE (APRESOLINE) 50 MG tablet Take 1 tablet (50 mg total) by mouth 3 (three) times daily. 90 tablet 6  . omeprazole (PRILOSEC) 40 MG capsule Take 1 capsule (40 mg total) by mouth daily. 30 capsule 6  . polyethylene glycol (MIRALAX / GLYCOLAX) 17 g packet Take 17 g by mouth 2 (two) times daily. (Patient taking differently: Take 17 g by mouth daily as needed for mild constipation (MIX AND DRINK).) 14 each 0  . senna-docusate (SENOKOT-S) 8.6-50 MG tablet Take 2 tablets by mouth at bedtime.    . sodium bicarbonate 650 MG tablet Take 2 tablets (1,300 mg total) by mouth 2 (two) times daily. 120 tablet 3  . Vitamin D, Ergocalciferol, (DRISDOL) 1.25 MG (50000 UNIT) CAPS capsule Take 1 capsule (50,000 Units total) by mouth every 7 (seven) days. 4 capsule 2   No current facility-administered medications for this visit.    REVIEW OF SYSTEMS:  '[X]'$  denotes positive finding, '[ ]'$  denotes negative finding Cardiac  Comments:  Chest pain or chest pressure:    Shortness of breath upon exertion:    Short of breath when lying flat:    Irregular heart rhythm:        Vascular    Pain in calf, thigh, or hip brought on by ambulation:    Pain in feet at night that wakes you up from your sleep:  x   Blood clot in your veins:    Leg swelling:  x       Pulmonary    Oxygen at home:    Productive cough:     Wheezing:         Neurologic    Sudden weakness in arms or legs:     Sudden numbness in arms or legs:     Sudden onset of difficulty speaking or slurred speech:    Temporary loss of vision in one eye:     Problems with dizziness:         Gastrointestinal    Blood in stool:     Vomited blood:         Genitourinary    Burning when urinating:     Blood in  urine:        Psychiatric    Major depression:  Hematologic    Bleeding problems:    Problems with blood clotting too easily:        Skin    Rashes or ulcers:        Constitutional    Fever or chills:     PHYSICAL EXAM:   Vitals:   12/21/20 1446  BP: (!) 143/70  Pulse: 78  Resp: 20  Temp: 98.2 F (36.8 C)  SpO2: 98%  Weight: 171 lb (77.6 kg)  Height: '5\' 5"'$  (1.651 m)    GENERAL: The patient is a well-nourished male, in no acute distress. The vital signs are documented above. CARDIAC: There is a regular rate and rhythm.  VASCULAR: I do not detect carotid bruits. He has palpable brachial and radial pulses bilaterally. I did look at his right arm myself with the SonoSite and I do not see any usable forearm or upper arm cephalic vein.  Likewise the basilic vein is quite small. PULMONARY: There is good air exchange bilaterally without wheezing or rales. ABDOMEN: Soft and non-tender with normal pitched bowel sounds.  MUSCULOSKELETAL: There are no major deformities or cyanosis. NEUROLOGIC: He has significant left upper extremity and left lower extremity weakness. SKIN: There are no ulcers or rashes noted. PSYCHIATRIC: The patient has a normal affect.  DATA:    BILATERAL UPPER EXTREMITY VEIN MAP: I have independently interpreted his upper extremity vein map.  On the right side the forearm and upper arm cephalic vein look small.  The right basilic vein looks marginal in size.  On the left side the forearm and upper arm cephalic vein are small.  The basilic vein looks marginal in size.  UPPER EXTREMITY ARTERIAL DUPLEX: I have independently interpreted his upper extremity arterial duplex scan.  On the right side there is a triphasic radial and ulnar waveform.  The brachial artery measures 5 mm in diameter.  On the left side, there is a triphasic radial and ulnar waveform.  The brachial artery measures 5 mm in diameter.

## 2021-01-18 ENCOUNTER — Other Ambulatory Visit: Payer: Self-pay | Admitting: Family Medicine

## 2021-01-18 DIAGNOSIS — N184 Chronic kidney disease, stage 4 (severe): Secondary | ICD-10-CM

## 2021-01-18 DIAGNOSIS — E1122 Type 2 diabetes mellitus with diabetic chronic kidney disease: Secondary | ICD-10-CM

## 2021-01-19 ENCOUNTER — Other Ambulatory Visit: Payer: Self-pay | Admitting: Family Medicine

## 2021-01-19 DIAGNOSIS — N184 Chronic kidney disease, stage 4 (severe): Secondary | ICD-10-CM

## 2021-01-19 DIAGNOSIS — E1122 Type 2 diabetes mellitus with diabetic chronic kidney disease: Secondary | ICD-10-CM

## 2021-01-20 ENCOUNTER — Telehealth: Payer: Self-pay | Admitting: Family Medicine

## 2021-01-20 NOTE — Telephone Encounter (Signed)
Copied from [place] [id number]. Topic: General - Call Back - No Documentation >> Jan 20, 2021  1:42 PM Erick Blinks wrote: Reason for CRM: Pt's mother Rudra Miner called reporting that pt needs a letter from PCP stating that he is mentally and physically not well enough to work at this time. He has had a stroke and he is paralyzed on his left side. And he is going into kidney failure. They need this by January 24, 2021. Please advise   Best contact: [phone]

## 2021-01-23 NOTE — Telephone Encounter (Signed)
Pt requesting letter.

## 2021-01-24 NOTE — Telephone Encounter (Signed)
Done

## 2021-01-24 NOTE — Telephone Encounter (Signed)
Pt was called and informed of letter being ready for pick up

## 2021-01-30 DIAGNOSIS — N184 Chronic kidney disease, stage 4 (severe): Secondary | ICD-10-CM | POA: Diagnosis not present

## 2021-01-30 DIAGNOSIS — N189 Chronic kidney disease, unspecified: Secondary | ICD-10-CM | POA: Diagnosis not present

## 2021-02-09 ENCOUNTER — Other Ambulatory Visit: Payer: Self-pay

## 2021-02-09 ENCOUNTER — Encounter (HOSPITAL_COMMUNITY)
Admission: RE | Admit: 2021-02-09 | Discharge: 2021-02-09 | Disposition: A | Discharge status: Home / Self Care | Payer: Medicaid Other | Source: Ambulatory Visit | Attending: Nephrology | Admitting: Nephrology

## 2021-02-09 DIAGNOSIS — N183 Chronic kidney disease, stage 3 unspecified: Secondary | ICD-10-CM | POA: Insufficient documentation

## 2021-02-09 MED ORDER — EPOETIN ALFA-EPBX 10000 UNIT/ML IJ SOLN: 20000.0000 [IU] | Freq: Once | INTRAMUSCULAR | Status: AC

## 2021-02-09 MED ORDER — EPOETIN ALFA-EPBX 10000 UNIT/ML IJ SOLN
INTRAMUSCULAR | Status: AC
  Administered 2021-02-09: 20000 [IU] via SUBCUTANEOUS
  Filled 2021-02-09: qty 2

## 2021-02-10 LAB — POCT HEMOGLOBIN-HEMACUE: Hemoglobin: 9.7 g/dL — ABNORMAL LOW (ref 13.0–17.0)

## 2021-02-23 ENCOUNTER — Encounter (HOSPITAL_COMMUNITY): Payer: Self-pay

## 2021-02-24 ENCOUNTER — Encounter (HOSPITAL_COMMUNITY)
Admission: RE | Admit: 2021-02-24 | Discharge: 2021-02-24 | Disposition: A | Discharge status: Home / Self Care | Payer: Medicaid Other | Source: Ambulatory Visit | Attending: Nephrology | Admitting: Nephrology

## 2021-02-24 ENCOUNTER — Other Ambulatory Visit: Payer: Self-pay

## 2021-02-24 VITALS — BP 140/76 | HR 76 | Temp 98.0°F | Resp 18

## 2021-02-24 DIAGNOSIS — N183 Chronic kidney disease, stage 3 unspecified: Secondary | ICD-10-CM

## 2021-02-24 LAB — POCT HEMOGLOBIN-HEMACUE: Hemoglobin: 10.8 g/dL — ABNORMAL LOW (ref 13.0–17.0)

## 2021-02-24 MED ORDER — EPOETIN ALFA-EPBX 10000 UNIT/ML IJ SOLN
20000.0000 [IU] | INTRAMUSCULAR | Status: DC
  Administered 2021-02-24: 20000 [IU] via SUBCUTANEOUS

## 2021-02-24 MED ORDER — EPOETIN ALFA-EPBX 10000 UNIT/ML IJ SOLN
INTRAMUSCULAR | Status: AC
  Filled 2021-02-24: qty 2

## 2021-02-28 ENCOUNTER — Other Ambulatory Visit: Payer: Self-pay

## 2021-02-28 ENCOUNTER — Encounter: Payer: Self-pay | Admitting: Family Medicine

## 2021-02-28 ENCOUNTER — Ambulatory Visit: Payer: Self-pay | Attending: Family Medicine | Admitting: Family Medicine

## 2021-02-28 VITALS — BP 170/85 | HR 80 | Ht 65.0 in | Wt 172.6 lb

## 2021-02-28 DIAGNOSIS — N184 Chronic kidney disease, stage 4 (severe): Secondary | ICD-10-CM

## 2021-02-28 DIAGNOSIS — I69354 Hemiplegia and hemiparesis following cerebral infarction affecting left non-dominant side: Secondary | ICD-10-CM

## 2021-02-28 DIAGNOSIS — E1169 Type 2 diabetes mellitus with other specified complication: Secondary | ICD-10-CM

## 2021-02-28 DIAGNOSIS — E1165 Type 2 diabetes mellitus with hyperglycemia: Secondary | ICD-10-CM

## 2021-02-28 DIAGNOSIS — I129 Hypertensive chronic kidney disease with stage 1 through stage 4 chronic kidney disease, or unspecified chronic kidney disease: Secondary | ICD-10-CM

## 2021-02-28 DIAGNOSIS — E1122 Type 2 diabetes mellitus with diabetic chronic kidney disease: Secondary | ICD-10-CM

## 2021-02-28 DIAGNOSIS — L603 Nail dystrophy: Secondary | ICD-10-CM

## 2021-02-28 LAB — POCT GLYCOSYLATED HEMOGLOBIN (HGB A1C): HbA1c, POC (controlled diabetic range): 7.4 % — AB (ref 0.0–7.0)

## 2021-02-28 LAB — GLUCOSE, POCT (MANUAL RESULT ENTRY): POC Glucose: 168 mg/dl — AB (ref 70–99)

## 2021-02-28 MED ORDER — ATORVASTATIN CALCIUM 80 MG PO TABS
80.0000 mg | ORAL_TABLET | Freq: Every day | ORAL | 6 refills | Status: DC
Start: 2021-02-28 — End: 2021-10-02

## 2021-02-28 MED ORDER — HYDRALAZINE HCL 100 MG PO TABS: 100.0000 mg | ORAL_TABLET | Freq: Three times a day (TID) | ORAL | 6 refills | Status: DC

## 2021-02-28 MED ORDER — FUROSEMIDE 80 MG PO TABS: 80.0000 mg | ORAL_TABLET | Freq: Every day | ORAL | 6 refills | Status: DC

## 2021-02-28 MED ORDER — CARVEDILOL 25 MG PO TABS: 25.0000 mg | ORAL_TABLET | Freq: Two times a day (BID) | ORAL | 6 refills | Status: DC

## 2021-02-28 MED ORDER — AMLODIPINE BESYLATE 10 MG PO TABS: 10.0000 mg | ORAL_TABLET | Freq: Every day | ORAL | 6 refills | Status: DC

## 2021-02-28 MED ORDER — GLIPIZIDE 5 MG PO TABS: 5.0000 mg | ORAL_TABLET | Freq: Two times a day (BID) | ORAL | 6 refills | Status: DC

## 2021-02-28 NOTE — Progress Notes (Signed)
Subjective:  Patient ID: Tony Schmitt, male    DOB: 1970/08/29  Age: 50 y.o. 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  Interval History: His BP is elevated and he endorses compliance with his antihypertensive. His CKD is managed by nephrology and he had a visit to vascular surgery 2 months ago for evaluation for AV fistula. He is currently on erythropoietin replacement therapy.  Blood sugars are around 160 - fasting Denies presence of hypoglycemia, numbness in extremities or visual concerns. Last saw Ophthalmology last year at [facility]. Denies acute concerns today. Past Medical History:  Diagnosis Date   Chronic kidney disease    Diabetes mellitus without complication ([place])    Hypertension    Stroke [facility])     Past Surgical History:  Procedure Laterality Date   BUBBLE STUDY  04/18/2020   Procedure: BUBBLE STUDY;  Surgeon: Sueanne Margarita, MD;  Location: [place];  Service: Cardiovascular;;   TEE WITHOUT CARDIOVERSION N/A 04/18/2020   Procedure: TRANSESOPHAGEAL ECHOCARDIOGRAM (TEE);  Surgeon: Sueanne Margarita, MD;  Location: [facility] ENDOSCOPY;  Service: Cardiovascular;  Laterality: N/A;    Family History  Problem Relation Age of Onset   Diabetes Mother    Diabetes Father    Hypertension Father    Cancer Father    Heart failure Other     Allergies  Allergen Reactions   Latex Itching    Outpatient Medications Prior to Visit  Medication Sig Dispense Refill   acetaminophen (TYLENOL) 325 MG tablet Take 2 tablets (650 mg total) by mouth every 4 (four) hours as needed for mild pain (or temp > 37.5 C (99.5 F)).     aspirin 81 MG chewable tablet Chew 1 tablet (81 mg total) by mouth  daily. (Patient taking differently: Chew 81 mg by mouth at bedtime.) 30 tablet 0   omeprazole (PRILOSEC) 40 MG capsule Take 1 capsule (40 mg total) by mouth daily. 30 capsule 6   polyethylene glycol (MIRALAX / GLYCOLAX) 17 g packet Take 17 g by mouth 2 (two) times daily. (Patient taking differently: Take 17 g by mouth daily as needed for mild constipation (MIX AND DRINK).) 14 each 0   senna-docusate (SENOKOT-S) 8.6-50 MG tablet Take 2 tablets by mouth at bedtime.     sodium bicarbonate 650 MG tablet Take 2 tablets by mouth twice daily 120 tablet 0   Vitamin D, Ergocalciferol, (DRISDOL) 1.25 MG (50000 UNIT) CAPS capsule Take 1 capsule (50,000 Units total) by mouth every 7 (seven) days. 4 capsule 2   amLODipine (NORVASC) 10 MG tablet Take 1 tablet (10 mg total) by mouth daily. 30 tablet 6   atorvastatin (LIPITOR) 80 MG tablet Take 1 tablet (80 mg total) by mouth at bedtime. 30 tablet 6   carvedilol (COREG) 25 MG tablet Take 1 tablet (25 mg total) by mouth 2 (two) times daily with a meal. 60 tablet 6   furosemide (LASIX) 80 MG tablet Take 1 tablet (80 mg total) by mouth daily. 30 tablet 6   glipiZIDE (GLUCOTROL) 5 MG tablet Take 1 tablet (5 mg total) by mouth 2 (two) times daily before a meal. 60 tablet 6   hydrALAZINE (APRESOLINE) 50 MG tablet Take 1 tablet (50 mg total)  by mouth 3 (three) times daily. 90 tablet 6   No facility-administered medications prior to visit.     ROS Review of Systems  Constitutional:  Negative for activity change and appetite change.  HENT:  Negative for sinus pressure and sore throat.   Eyes:  Negative for visual disturbance.  Respiratory:  Negative for cough, chest tightness and shortness of breath.   Cardiovascular:  Negative for chest pain and leg swelling.  Gastrointestinal:  Negative for abdominal distention, abdominal pain, constipation and diarrhea.  Endocrine: Negative.   Genitourinary:  Negative for dysuria.  Musculoskeletal:  Positive for gait problem.  Negative for back pain.  Skin:  Negative for rash.  Allergic/Immunologic: Negative.   Neurological:  Positive for weakness. Negative for light-headedness and numbness.  Psychiatric/Behavioral:  Negative for dysphoric mood and suicidal ideas.    Objective:  BP (!) 170/85   Pulse 80   Ht 5' 5" (1.651 m)   Wt 172 lb 9.6 oz (78.3 kg)   SpO2 100%   BMI 28.72 kg/m   BP/Weight 02/28/2021 3/79/0240 04/13/3531  Systolic BP 992 426 834  Diastolic BP 85 76 73  Wt. (Lbs) 172.6 - -  BMI 28.72 - -      Physical Exam Constitutional:      Appearance: He is well-developed.  Cardiovascular:     Rate and Rhythm: Normal rate.     Heart sounds: Normal heart sounds. No murmur heard. Pulmonary:     Effort: Pulmonary effort is normal.     Breath sounds: Normal breath sounds. No wheezing or rales.  Chest:     Chest wall: No tenderness.  Abdominal:     General: Bowel sounds are normal. There is no distension.     Palpations: Abdomen is soft. There is no mass.     Tenderness: There is no abdominal tenderness.  Musculoskeletal:        General: Swelling (LUE) present.     Right lower leg: Edema present.     Left lower leg: Edema present.     Comments: L Upper extremity weakness  Neurological:     Mental Status: He is alert and oriented to person, place, and time.  Psychiatric:        Mood and Affect: Mood normal.  Diabetic Foot Exam - Simple   Simple Foot Form Visual Inspection Sensation Testing Pulse Check Comments Dystrophic toenails bilaterally.  Slight pedal edema ,unable to palpate dorsalis pedis and posterior tibialis      CMP Latest Ref Rng & Units 06/14/2020 06/13/2020 06/12/2020  Glucose 70 - 99 mg/dL 152(H) 162(H) 149(H)  BUN 6 - 20 mg/dL 34(H) 30(H) 26(H)  Creatinine 0.61 - 1.24 mg/dL 3.44(H) 3.49(H) 3.37(H)  Sodium 135 - 145 mmol/L 139 140 142  Potassium 3.5 - 5.1 mmol/L 3.8 3.8 3.6  Chloride 98 - 111 mmol/L 106 107 110  CO2 22 - 32 mmol/L _0 Calcium 8.9 - 10.3  mg/dL 9.1 9.4 9.2  Total Protein 6.5 - 8.1 g/dL - - -  Total Bilirubin 0.3 - 1.2 mg/dL - - -  Alkaline Phos 38 - 126 U/L - - -  AST 15 - 41 U/L - - -  ALT 0 - 44 U/L - - -    Lipid Panel     Component Value Date/Time   CHOL 699 (H) 04/16/2020 0558   CHOL 382 (H) 08/19/2019 1526   TRIG 298 (H) 04/16/2020 0558   HDL 40 (L) 04/16/2020 0558   HDL  116 08/19/2019 1526   CHOLHDL 17.5 04/16/2020 0558   VLDL 60 (H) 04/16/2020 0558   LDLCALC 599 (H) 04/16/2020 0558   LDLCALC 209 (H) 08/19/2019 1526    CBC    Component Value Date/Time   WBC 9.6 06/14/2020 0221   RBC 3.24 (L) 06/14/2020 0221   HGB 10.8 (L) 02/24/2021 1444   HGB 9.8 (L) 06/07/2020 1551   HCT 27.7 (L) 06/14/2020 0221   HCT 30.3 (L) 06/07/2020 1551   PLT 413 (H) 06/14/2020 0221   PLT 471 (H) 06/07/2020 1551   MCV 85.5 06/14/2020 0221   MCV 84 06/07/2020 1551   MCH 27.2 06/14/2020 0221   MCHC 31.8 06/14/2020 0221   RDW 14.8 06/14/2020 0221   RDW 14.1 06/07/2020 1551   LYMPHSABS 2.2 06/11/2020 0707   LYMPHSABS 1.8 06/07/2020 1551   MONOABS 0.8 06/11/2020 0707   EOSABS 0.2 06/11/2020 0707   EOSABS 0.2 06/07/2020 1551   BASOSABS 0.1 06/11/2020 0707   BASOSABS 0.1 06/07/2020 1551    Lab Results  Component Value Date   HGBA1C 7.4 (A) 02/28/2021    Assessment & Plan:  1. Type 2 diabetes mellitus with other specified complication, without long-term current use of insulin (HCC) Stable with A1c of 7.4; goal is less than 7.5 due to CKD Continue current regimen Counseled on Diabetic diet, my plate method, 789 minutes of moderate intensity exercise/week Blood sugar logs with fasting goals of 80-120 mg/dl, random of less than 180 and in the event of sugars less than 60 mg/dl or greater than 400 mg/dl encouraged to notify the clinic. Advised on the need for annual eye exams, annual foot exams, Pneumonia vaccine. - POCT glucose (manual entry) - POCT glycosylated hemoglobin (Hb A1C) - atorvastatin (LIPITOR) 80 MG  tablet; Take 1 tablet (80 mg total) by mouth at bedtime.  Dispense: 30 tablet; Refill: 6 - glipiZIDE (GLUCOTROL) 5 MG tablet; Take 1 tablet (5 mg total) by mouth 2 (two) times daily before a meal.  Dispense: 60 tablet; Refill: 6 - CMP14+EGFR  2. Hypertension in stage 4 chronic kidney disease due to type 2 diabetes mellitus ([place]) Uncontrolled Increased dose of hydralazine Counseled on blood pressure goal of less than 130/80, low-sodium, DASH diet, medication compliance, 150 minutes of moderate intensity exercise per week. Discussed medication compliance, adverse effects. - hydrALAZINE (APRESOLINE) 100 MG tablet; Take 1 tablet (100 mg total) by mouth 3 (three) times daily.  Dispense: 90 tablet; Refill: 6 - amLODipine (NORVASC) 10 MG tablet; Take 1 tablet (10 mg total) by mouth daily.  Dispense: 30 tablet; Refill: 6 - carvedilol (COREG) 25 MG tablet; Take 1 tablet (25 mg total) by mouth 2 (two) times daily with a meal.  Dispense: 60 tablet; Refill: 6 - furosemide (LASIX) 80 MG tablet; Take 1 tablet (80 mg total) by mouth daily.  Dispense: 30 tablet; Refill: 6  3. Dystrophic nail - Ambulatory referral to Podiatry  4.  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    Dispense:  90 tablet    Refill:  6    Dose increase   amLODipine (NORVASC) 10 MG tablet    Sig: Take 1 tablet (10 mg total) by mouth daily.    Dispense:  30 tablet    Refill:  6   atorvastatin (LIPITOR) 80 MG tablet    Sig: Take 1 tablet (80 mg total) by mouth at bedtime.    Dispense:  30 tablet    Refill:  6   carvedilol (COREG) 25 MG tablet    Sig: Take 1 tablet (25 mg total) by mouth 2 (two) times daily with a meal.    Dispense:  60 tablet    Refill:  6    furosemide (LASIX) 80 MG tablet    Sig: Take 1 tablet (80 mg total) by mouth daily.    Dispense:  30 tablet    Refill:  6   glipiZIDE (GLUCOTROL) 5 MG tablet    Sig: Take 1 tablet (5 mg total) by mouth 2 (two) times daily before a meal.    Dispense:  60 tablet    Refill:  6     Follow-up: Return in about 3 months (around 05/31/2021) for medical conditions.       Charlott Rakes, MD, FAAFP. [facility] and [place], [place]   02/28/2021, 3:49 PM

## 2021-02-28 NOTE — Progress Notes (Signed)
Referral to podiatry  

## 2021-02-28 NOTE — Patient Instructions (Signed)

## 2021-03-01 LAB — CMP14+EGFR
ALT: 29 IU/L (ref 0–44)
AST: 15 IU/L (ref 0–40)
Albumin/Globulin Ratio: 1.5 (ref 1.2–2.2)
Albumin: 3.5 g/dL — ABNORMAL LOW (ref 3.8–4.9)
Alkaline Phosphatase: 164 IU/L — ABNORMAL HIGH (ref 44–121)
BUN/Creatinine Ratio: 12 (ref 9–20)
BUN: 41 mg/dL — ABNORMAL HIGH (ref 6–24)
Bilirubin Total: <0.2 mg/dL (ref 0.0–1.2)
CO2: 21 mmol/L (ref 20–29)
Calcium: 9.7 mg/dL (ref 8.7–10.2)
Chloride: 107 mmol/L — ABNORMAL HIGH (ref 96–106)
Creatinine, Ser: 3.5 mg/dL — ABNORMAL HIGH (ref 0.76–1.27)
Globulin, Total: 2.4 g/dL (ref 1.5–4.5)
Glucose: 149 mg/dL — ABNORMAL HIGH (ref 65–99)
Potassium: 4.2 mmol/L (ref 3.5–5.2)
Sodium: 142 mmol/L (ref 134–144)
Total Protein: 5.9 g/dL — ABNORMAL LOW (ref 6.0–8.5)
eGFR: 20 mL/min/{1.73_m2} — ABNORMAL LOW (ref >59)

## 2021-03-03 ENCOUNTER — Telehealth: Payer: Self-pay

## 2021-03-03 NOTE — Telephone Encounter (Signed)
Patient was called and a voicemail was left informing patient to return phone call for lab results. 

## 2021-03-03 NOTE — Telephone Encounter (Signed)
-----   Message from Charlott Rakes, MD sent at 03/01/2021  8:23 AM EDT ----- Please inform him that kidney function is still abnormal but stable.  Advised to continue to follow-up with nephrologist.

## 2021-03-07 ENCOUNTER — Other Ambulatory Visit: Payer: Self-pay

## 2021-03-07 ENCOUNTER — Ambulatory Visit (INDEPENDENT_AMBULATORY_CARE_PROVIDER_SITE_OTHER): Payer: Self-pay | Admitting: Podiatry

## 2021-03-07 DIAGNOSIS — B351 Tinea unguium: Secondary | ICD-10-CM

## 2021-03-07 DIAGNOSIS — N183 Chronic kidney disease, stage 3 unspecified: Secondary | ICD-10-CM

## 2021-03-07 NOTE — Progress Notes (Signed)
  Subjective:  Patient ID: Tony Schmitt, male    DOB: [birth year],  MRN: [id number]  Chief Complaint  Patient presents with   Nail Problem    RFC Nail trim, biliateral  nails 1-5.    Eczema    Bilateral dry flaky skin.     50 y.o. male presents with the above complaint. History confirmed with patient.   Objective:  Physical Exam: warm, good capillary refill, nail exam onychomycosis of the toenails, no ulcerative lesions. DP pulses palpable, PT pulses palpable, and protective sensation intact. Severe xerosis and trophic changes.  Left Foot: normal exam, no swelling, tenderness, instability; ligaments intact, full range of motion of all ankle/foot joints  Right Foot: normal exam, no swelling, tenderness, instability; ligaments intact, full range of motion of all ankle/foot joints   No images are attached to the encounter.  Assessment:   1. Onychomycosis   2. Stage 3 chronic kidney disease, unspecified whether stage 3a or 3b CKD ([place])      Plan:  Patient was evaluated and treated and all questions answered.  Onychomycosis -Patient is diabetic with a qualifying condition for at risk foot care.  Procedure: Nail Debridement Type of Debridement: manual, sharp debridement. Instrumentation: Nail nipper, rotary burr. Number of Nails: 10  Xerosis -Appears general rather than fungal etiology -Discussed lotion twice daily for this.  Return in about 3 months (around 06/07/2021).

## 2021-03-10 ENCOUNTER — Encounter (HOSPITAL_COMMUNITY)
Admission: RE | Admit: 2021-03-10 | Discharge: 2021-03-10 | Disposition: A | Discharge status: Home / Self Care | Payer: Medicaid Other | Source: Ambulatory Visit | Attending: Nephrology | Admitting: Nephrology

## 2021-03-10 VITALS — BP 140/76 | HR 77 | Temp 99.1°F | Resp 18

## 2021-03-10 DIAGNOSIS — N183 Chronic kidney disease, stage 3 unspecified: Secondary | ICD-10-CM | POA: Insufficient documentation

## 2021-03-10 LAB — CBC WITH DIFFERENTIAL/PLATELET
Abs Immature Granulocytes: 0.04 10*3/uL (ref 0.00–0.07)
Basophils Absolute: 0.1 10*3/uL (ref 0.0–0.1)
Basophils Relative: 1 %
Eosinophils Absolute: 0.3 10*3/uL (ref 0.0–0.5)
Eosinophils Relative: 3 %
HCT: 32.6 % — ABNORMAL LOW (ref 39.0–52.0)
Hemoglobin: 10.4 g/dL — ABNORMAL LOW (ref 13.0–17.0)
Immature Granulocytes: 0 %
Lymphocytes Relative: 34 %
Lymphs Abs: 3.4 10*3/uL (ref 0.7–4.0)
MCH: 27.2 pg (ref 26.0–34.0)
MCHC: 31.9 g/dL (ref 30.0–36.0)
MCV: 85.3 fL (ref 80.0–100.0)
Monocytes Absolute: 0.7 10*3/uL (ref 0.1–1.0)
Monocytes Relative: 7 %
Neutro Abs: 5.6 10*3/uL (ref 1.7–7.7)
Neutrophils Relative %: 55 %
Platelets: 432 10*3/uL — ABNORMAL HIGH (ref 150–400)
RBC: 3.82 MIL/uL — ABNORMAL LOW (ref 4.22–5.81)
RDW: 15 % (ref 11.5–15.5)
WBC: 10 10*3/uL (ref 4.0–10.5)
nRBC: 0 % (ref 0.0–0.2)

## 2021-03-10 LAB — RENAL FUNCTION PANEL
Albumin: 2.6 g/dL — ABNORMAL LOW (ref 3.5–5.0)
Anion gap: 7 (ref 5–15)
BUN: 43 mg/dL — ABNORMAL HIGH (ref 6–20)
CO2: 25 mmol/L (ref 22–32)
Calcium: 9.3 mg/dL (ref 8.9–10.3)
Chloride: 107 mmol/L (ref 98–111)
Creatinine, Ser: 3.92 mg/dL — ABNORMAL HIGH (ref 0.61–1.24)
GFR, Estimated: 18 mL/min — ABNORMAL LOW (ref >60)
Glucose, Bld: 173 mg/dL — ABNORMAL HIGH (ref 70–99)
Phosphorus: 3.5 mg/dL (ref 2.5–4.6)
Potassium: 3.6 mmol/L (ref 3.5–5.1)
Sodium: 139 mmol/L (ref 135–145)

## 2021-03-10 LAB — POCT HEMOGLOBIN-HEMACUE: Hemoglobin: 10.6 g/dL — ABNORMAL LOW (ref 13.0–17.0)

## 2021-03-10 MED ORDER — EPOETIN ALFA-EPBX 10000 UNIT/ML IJ SOLN
INTRAMUSCULAR | Status: AC
  Filled 2021-03-10: qty 2

## 2021-03-10 MED ORDER — EPOETIN ALFA-EPBX 10000 UNIT/ML IJ SOLN
20000.0000 [IU] | INTRAMUSCULAR | Status: DC
  Administered 2021-03-10: 20000 [IU] via SUBCUTANEOUS

## 2021-03-24 ENCOUNTER — Encounter (HOSPITAL_COMMUNITY)
Admission: RE | Admit: 2021-03-24 | Discharge: 2021-03-24 | Disposition: A | Discharge status: Home / Self Care | Payer: Medicaid Other | Source: Ambulatory Visit | Attending: Nephrology | Admitting: Nephrology

## 2021-03-24 ENCOUNTER — Other Ambulatory Visit: Payer: Self-pay

## 2021-03-24 VITALS — BP 139/79 | HR 70 | Temp 98.1°F | Resp 18

## 2021-03-24 DIAGNOSIS — N183 Chronic kidney disease, stage 3 unspecified: Secondary | ICD-10-CM | POA: Diagnosis not present

## 2021-03-24 LAB — POCT HEMOGLOBIN-HEMACUE: Hemoglobin: 11.6 g/dL — ABNORMAL LOW (ref 13.0–17.0)

## 2021-03-24 MED ORDER — EPOETIN ALFA-EPBX 10000 UNIT/ML IJ SOLN: 20000.0000 [IU] | INTRAMUSCULAR | Status: DC

## 2021-03-24 MED ORDER — EPOETIN ALFA-EPBX 10000 UNIT/ML IJ SOLN
INTRAMUSCULAR | Status: AC
  Administered 2021-03-24: 20000 [IU] via SUBCUTANEOUS
  Filled 2021-03-24: qty 2

## 2021-03-26 ENCOUNTER — Other Ambulatory Visit: Payer: Self-pay | Admitting: Family Medicine

## 2021-03-26 DIAGNOSIS — E1122 Type 2 diabetes mellitus with diabetic chronic kidney disease: Secondary | ICD-10-CM

## 2021-03-26 DIAGNOSIS — N184 Chronic kidney disease, stage 4 (severe): Secondary | ICD-10-CM

## 2021-03-26 NOTE — Telephone Encounter (Signed)
Requested Prescriptions  Pending Prescriptions Disp Refills  . sodium bicarbonate 650 MG tablet [Pharmacy Med Name: Sodium Bicarbonate 650 MG Oral Tablet] 120 tablet 0    Sig: Take 2 tablets by mouth twice daily     Endocrinology:  Minerals Passed - 03/26/2021 12:21 PM      Passed - Valid encounter within last 12 months    Recent Outpatient Visits          3 weeks ago Type 2 diabetes mellitus with other specified complication, without long-term current use of insulin ([place])   [place], [place], MD   3 months ago Type 2 diabetes mellitus with hyperglycemia, without long-term current use of insulin ([place])   [place], Charlane Ferretti, MD   6 months ago Need for vaccination against Streptococcus pneumoniae   [place], [name] L, RPH-CPP   6 months ago Type 2 diabetes mellitus with hyperglycemia, without long-term current use of insulin ([place])   [place], Charlane Ferretti, MD   10 months ago Hemiparesis affecting left side as late effect of cerebrovascular accident [facility])   [place], MD      Future Appointments            In 2 months Charlott Rakes, MD [place]

## 2021-04-06 ENCOUNTER — Other Ambulatory Visit (HOSPITAL_COMMUNITY): Payer: Self-pay | Admitting: *Deleted

## 2021-04-07 ENCOUNTER — Encounter (HOSPITAL_COMMUNITY): Payer: Self-pay

## 2021-04-14 ENCOUNTER — Encounter (HOSPITAL_COMMUNITY)
Admission: RE | Admit: 2021-04-14 | Discharge: 2021-04-14 | Disposition: A | Discharge status: Home / Self Care | Payer: Medicaid Other | Source: Ambulatory Visit | Attending: Nephrology | Admitting: Nephrology

## 2021-04-14 ENCOUNTER — Other Ambulatory Visit: Payer: Self-pay

## 2021-04-14 VITALS — BP 153/79 | HR 75 | Temp 98.2°F | Resp 17

## 2021-04-14 DIAGNOSIS — N183 Chronic kidney disease, stage 3 unspecified: Secondary | ICD-10-CM | POA: Insufficient documentation

## 2021-04-14 LAB — RENAL FUNCTION PANEL
Albumin: 2.7 g/dL — ABNORMAL LOW (ref 3.5–5.0)
Anion gap: 9 (ref 5–15)
BUN: 44 mg/dL — ABNORMAL HIGH (ref 6–20)
CO2: 24 mmol/L (ref 22–32)
Calcium: 9.2 mg/dL (ref 8.9–10.3)
Chloride: 103 mmol/L (ref 98–111)
Creatinine, Ser: 4 mg/dL — ABNORMAL HIGH (ref 0.61–1.24)
GFR, Estimated: 17 mL/min — ABNORMAL LOW (ref >60)
Glucose, Bld: 191 mg/dL — ABNORMAL HIGH (ref 70–99)
Phosphorus: 3.7 mg/dL (ref 2.5–4.6)
Potassium: 3.8 mmol/L (ref 3.5–5.1)
Sodium: 136 mmol/L (ref 135–145)

## 2021-04-14 LAB — CBC WITH DIFFERENTIAL/PLATELET
Abs Immature Granulocytes: 0.04 10*3/uL (ref 0.00–0.07)
Basophils Absolute: 0.1 10*3/uL (ref 0.0–0.1)
Basophils Relative: 1 %
Eosinophils Absolute: 0.3 10*3/uL (ref 0.0–0.5)
Eosinophils Relative: 3 %
HCT: 39.1 % (ref 39.0–52.0)
Hemoglobin: 12.6 g/dL — ABNORMAL LOW (ref 13.0–17.0)
Immature Granulocytes: 0 %
Lymphocytes Relative: 28 %
Lymphs Abs: 2.9 10*3/uL (ref 0.7–4.0)
MCH: 27.1 pg (ref 26.0–34.0)
MCHC: 32.2 g/dL (ref 30.0–36.0)
MCV: 84.1 fL (ref 80.0–100.0)
Monocytes Absolute: 0.7 10*3/uL (ref 0.1–1.0)
Monocytes Relative: 6 %
Neutro Abs: 6.4 10*3/uL (ref 1.7–7.7)
Neutrophils Relative %: 62 %
Platelets: 379 10*3/uL (ref 150–400)
RBC: 4.65 MIL/uL (ref 4.22–5.81)
RDW: 14.4 % (ref 11.5–15.5)
WBC: 10.4 10*3/uL (ref 4.0–10.5)
nRBC: 0 % (ref 0.0–0.2)

## 2021-04-14 LAB — IRON AND TIBC
Iron: 70 ug/dL (ref 45–182)
Saturation Ratios: 34 % (ref 17.9–39.5)
TIBC: 203 ug/dL — ABNORMAL LOW (ref 250–450)
UIBC: 133 ug/dL

## 2021-04-14 LAB — FERRITIN: Ferritin: 494 ng/mL — ABNORMAL HIGH (ref 24–336)

## 2021-04-14 LAB — POCT HEMOGLOBIN-HEMACUE: Hemoglobin: 12.6 g/dL — ABNORMAL LOW (ref 13.0–17.0)

## 2021-04-14 MED ORDER — EPOETIN ALFA-EPBX 10000 UNIT/ML IJ SOLN: 20000.0000 [IU] | INTRAMUSCULAR | Status: DC

## 2021-04-21 ENCOUNTER — Encounter (HOSPITAL_COMMUNITY): Payer: Self-pay

## 2021-04-28 ENCOUNTER — Other Ambulatory Visit: Payer: Self-pay

## 2021-04-28 ENCOUNTER — Encounter (HOSPITAL_COMMUNITY)
Admission: RE | Admit: 2021-04-28 | Discharge: 2021-04-28 | Disposition: A | Discharge status: Home / Self Care | Payer: Medicaid Other | Source: Ambulatory Visit | Attending: Nephrology | Admitting: Nephrology

## 2021-04-28 VITALS — BP 181/97 | HR 82 | Temp 97.6°F | Resp 17

## 2021-04-28 DIAGNOSIS — N183 Chronic kidney disease, stage 3 unspecified: Secondary | ICD-10-CM | POA: Diagnosis not present

## 2021-04-28 MED ORDER — EPOETIN ALFA-EPBX 10000 UNIT/ML IJ SOLN: 20000.0000 [IU] | INTRAMUSCULAR | Status: DC

## 2021-04-30 ENCOUNTER — Other Ambulatory Visit: Payer: Self-pay | Admitting: Family Medicine

## 2021-04-30 DIAGNOSIS — E1122 Type 2 diabetes mellitus with diabetic chronic kidney disease: Secondary | ICD-10-CM

## 2021-04-30 DIAGNOSIS — N184 Chronic kidney disease, stage 4 (severe): Secondary | ICD-10-CM

## 2021-05-01 LAB — POCT HEMOGLOBIN-HEMACUE: Hemoglobin: 12.4 g/dL — ABNORMAL LOW (ref 13.0–17.0)

## 2021-05-09 ENCOUNTER — Encounter (HOSPITAL_COMMUNITY): Payer: Self-pay

## 2021-05-09 ENCOUNTER — Other Ambulatory Visit: Payer: Self-pay | Admitting: Family Medicine

## 2021-05-09 DIAGNOSIS — K2100 Gastro-esophageal reflux disease with esophagitis, without bleeding: Secondary | ICD-10-CM

## 2021-05-12 ENCOUNTER — Encounter (HOSPITAL_COMMUNITY)
Admission: RE | Admit: 2021-05-12 | Discharge: 2021-05-12 | Disposition: A | Discharge status: Home / Self Care | Payer: Medicaid Other | Source: Ambulatory Visit | Attending: Nephrology | Admitting: Nephrology

## 2021-05-12 ENCOUNTER — Other Ambulatory Visit: Payer: Self-pay

## 2021-05-12 ENCOUNTER — Encounter (HOSPITAL_COMMUNITY): Payer: Self-pay

## 2021-05-12 VITALS — BP 161/74 | HR 84 | Temp 97.4°F | Resp 18

## 2021-05-12 DIAGNOSIS — N183 Chronic kidney disease, stage 3 unspecified: Secondary | ICD-10-CM | POA: Diagnosis not present

## 2021-05-12 LAB — RENAL FUNCTION PANEL
Albumin: 2.7 g/dL — ABNORMAL LOW (ref 3.5–5.0)
Anion gap: 8 (ref 5–15)
BUN: 35 mg/dL — ABNORMAL HIGH (ref 6–20)
CO2: 22 mmol/L (ref 22–32)
Calcium: 9.1 mg/dL (ref 8.9–10.3)
Chloride: 107 mmol/L (ref 98–111)
Creatinine, Ser: 3.74 mg/dL — ABNORMAL HIGH (ref 0.61–1.24)
GFR, Estimated: 19 mL/min — ABNORMAL LOW (ref >60)
Glucose, Bld: 191 mg/dL — ABNORMAL HIGH (ref 70–99)
Phosphorus: 3.3 mg/dL (ref 2.5–4.6)
Potassium: 3.7 mmol/L (ref 3.5–5.1)
Sodium: 137 mmol/L (ref 135–145)

## 2021-05-12 LAB — CBC WITH DIFFERENTIAL/PLATELET
Abs Immature Granulocytes: 0.04 10*3/uL (ref 0.00–0.07)
Basophils Absolute: 0.1 10*3/uL (ref 0.0–0.1)
Basophils Relative: 1 %
Eosinophils Absolute: 0.3 10*3/uL (ref 0.0–0.5)
Eosinophils Relative: 2 %
HCT: 35.8 % — ABNORMAL LOW (ref 39.0–52.0)
Hemoglobin: 11.6 g/dL — ABNORMAL LOW (ref 13.0–17.0)
Immature Granulocytes: 0 %
Lymphocytes Relative: 25 %
Lymphs Abs: 2.7 10*3/uL (ref 0.7–4.0)
MCH: 26.7 pg (ref 26.0–34.0)
MCHC: 32.4 g/dL (ref 30.0–36.0)
MCV: 82.5 fL (ref 80.0–100.0)
Monocytes Absolute: 0.9 10*3/uL (ref 0.1–1.0)
Monocytes Relative: 8 %
Neutro Abs: 6.8 10*3/uL (ref 1.7–7.7)
Neutrophils Relative %: 64 %
Platelets: 350 10*3/uL (ref 150–400)
RBC: 4.34 MIL/uL (ref 4.22–5.81)
RDW: 14.3 % (ref 11.5–15.5)
WBC: 10.8 10*3/uL — ABNORMAL HIGH (ref 4.0–10.5)
nRBC: 0 % (ref 0.0–0.2)

## 2021-05-12 MED ORDER — EPOETIN ALFA-EPBX 10000 UNIT/ML IJ SOLN
20000.0000 [IU] | INTRAMUSCULAR | Status: DC
  Administered 2021-05-12: 20000 [IU] via SUBCUTANEOUS

## 2021-05-12 MED ORDER — EPOETIN ALFA-EPBX 10000 UNIT/ML IJ SOLN
INTRAMUSCULAR | Status: AC
  Filled 2021-05-12: qty 2

## 2021-05-16 LAB — POCT HEMOGLOBIN-HEMACUE: Hemoglobin: 11.7 g/dL — ABNORMAL LOW (ref 13.0–17.0)

## 2021-05-26 ENCOUNTER — Encounter (HOSPITAL_COMMUNITY)
Admission: RE | Admit: 2021-05-26 | Discharge: 2021-05-26 | Disposition: A | Discharge status: Home / Self Care | Payer: Medicaid Other | Source: Ambulatory Visit | Attending: Nephrology | Admitting: Nephrology

## 2021-05-26 VITALS — BP 134/71 | HR 75 | Temp 97.0°F | Resp 18

## 2021-05-26 DIAGNOSIS — N183 Chronic kidney disease, stage 3 unspecified: Secondary | ICD-10-CM | POA: Diagnosis not present

## 2021-05-26 LAB — POCT HEMOGLOBIN-HEMACUE: Hemoglobin: 12.4 g/dL — ABNORMAL LOW (ref 13.0–17.0)

## 2021-05-26 MED ORDER — EPOETIN ALFA-EPBX 10000 UNIT/ML IJ SOLN: 20000.0000 [IU] | INTRAMUSCULAR | Status: DC

## 2021-05-28 ENCOUNTER — Other Ambulatory Visit: Payer: Self-pay | Admitting: Family Medicine

## 2021-05-28 DIAGNOSIS — E1122 Type 2 diabetes mellitus with diabetic chronic kidney disease: Secondary | ICD-10-CM

## 2021-05-31 ENCOUNTER — Encounter: Payer: Self-pay | Admitting: Family Medicine

## 2021-05-31 ENCOUNTER — Ambulatory Visit: Payer: Self-pay | Attending: Family Medicine | Admitting: Family Medicine

## 2021-05-31 ENCOUNTER — Other Ambulatory Visit: Payer: Self-pay

## 2021-05-31 VITALS — BP 157/87 | HR 74 | Ht 65.0 in | Wt 168.8 lb

## 2021-05-31 DIAGNOSIS — E1169 Type 2 diabetes mellitus with other specified complication: Secondary | ICD-10-CM

## 2021-05-31 DIAGNOSIS — N184 Chronic kidney disease, stage 4 (severe): Secondary | ICD-10-CM

## 2021-05-31 DIAGNOSIS — E1122 Type 2 diabetes mellitus with diabetic chronic kidney disease: Secondary | ICD-10-CM

## 2021-05-31 DIAGNOSIS — Z23 Encounter for immunization: Secondary | ICD-10-CM

## 2021-05-31 DIAGNOSIS — I129 Hypertensive chronic kidney disease with stage 1 through stage 4 chronic kidney disease, or unspecified chronic kidney disease: Secondary | ICD-10-CM

## 2021-05-31 LAB — GLUCOSE, POCT (MANUAL RESULT ENTRY): POC Glucose: 107 mg/dl — AB (ref 70–99)

## 2021-05-31 LAB — POCT GLYCOSYLATED HEMOGLOBIN (HGB A1C): HbA1c, POC (controlled diabetic range): 7.1 % — AB (ref 0.0–7.0)

## 2021-05-31 MED ORDER — ISOSORBIDE MONONITRATE ER 30 MG PO TB24: 30.0000 mg | ORAL_TABLET | Freq: Every day | ORAL | 6 refills | Status: DC

## 2021-05-31 NOTE — Patient Instructions (Signed)

## 2021-05-31 NOTE — Progress Notes (Signed)
Subjective:  Patient ID: Tony Schmitt, male    DOB: 1971-05-22  Age: 50 y.o. MRN: [id number]  CC: Diabetes   HPI Tony Schmitt is a 50 y.o. 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 Compliant with his diabetes medications and has no hypoglycemia.  He has no neuropathy or visual concerns.  Doing well on his statin.  He has tightness n his L leg and is undergoing PT; states symptoms are mild. L hemiparesis still persists; denies recent falls. CKD is managed by nephrology and he is on iron replacement therapy. Past Medical History:  Diagnosis Date   Chronic kidney disease    Diabetes mellitus without complication ([place])    Hypertension    Stroke [facility])     Past Surgical History:  Procedure Laterality Date   BUBBLE STUDY  04/18/2020   Procedure: BUBBLE STUDY;  Surgeon: Sueanne Margarita, MD;  Location: [place];  Service: Cardiovascular;;   TEE WITHOUT CARDIOVERSION N/A 04/18/2020   Procedure: TRANSESOPHAGEAL ECHOCARDIOGRAM (TEE);  Surgeon: Sueanne Margarita, MD;  Location: [facility] ENDOSCOPY;  Service: Cardiovascular;  Laterality: N/A;    Family History  Problem Relation Age of Onset   Diabetes Mother    Diabetes Father    Hypertension Father    Cancer Father    Heart failure Other     Allergies  Allergen Reactions   Latex Itching    Outpatient Medications Prior to Visit  Medication Sig Dispense Refill   acetaminophen (TYLENOL) 325 MG tablet Take 2 tablets (650 mg total) by mouth every 4 (four) hours as needed for mild pain (or temp > 37.5 C (99.5 F)).     amLODipine (NORVASC) 10 MG tablet Take 1 tablet  (10 mg total) by mouth daily. 30 tablet 6   aspirin 81 MG chewable tablet Chew 1 tablet (81 mg total) by mouth daily. (Patient taking differently: Chew 81 mg by mouth at bedtime.) 30 tablet 0   atorvastatin (LIPITOR) 80 MG tablet Take 1 tablet (80 mg total) by mouth at bedtime. 30 tablet 6   carvedilol (COREG) 25 MG tablet Take 1 tablet (25 mg total) by mouth 2 (two) times daily with a meal. 60 tablet 6   furosemide (LASIX) 80 MG tablet Take 1 tablet (80 mg total) by mouth daily. 30 tablet 6   glipiZIDE (GLUCOTROL) 5 MG tablet Take 1 tablet (5 mg total) by mouth 2 (two) times daily before a meal. 60 tablet 6   hydrALAZINE (APRESOLINE) 100 MG tablet Take 1 tablet (100 mg total) by mouth 3 (three) times daily. 90 tablet 6   omeprazole (PRILOSEC) 40 MG capsule Take 1 capsule by mouth once daily 30 capsule 0   polyethylene glycol (MIRALAX / GLYCOLAX) 17 g packet Take 17 g by mouth 2 (two) times daily. (Patient taking differently: Take 17 g by mouth daily as needed for mild constipation (MIX AND DRINK).) 14 each 0   senna-docusate (SENOKOT-S) 8.6-50 MG tablet Take 2 tablets by mouth at bedtime.     sodium bicarbonate 650 MG tablet Take 2 tablets by mouth twice daily 120 tablet 0   Vitamin D, Ergocalciferol, (DRISDOL) 1.25 MG (  50000 UNIT) CAPS capsule Take 1 capsule (50,000 Units total) by mouth every 7 (seven) days. (Patient not taking: No sig reported) 4 capsule 2   No facility-administered medications prior to visit.     ROS Review of Systems  Constitutional:  Negative for activity change and appetite change.  HENT:  Negative for sinus pressure and sore throat.   Eyes:  Negative for visual disturbance.  Respiratory:  Negative for cough, chest tightness and shortness of breath.   Cardiovascular:  Negative for chest pain and leg swelling.  Gastrointestinal:  Negative for abdominal distention, abdominal pain, constipation and diarrhea.  Endocrine: Negative.   Genitourinary:  Negative for dysuria.   Musculoskeletal:  Negative for joint swelling and myalgias.  Skin:  Negative for rash.  Allergic/Immunologic: Negative.   Neurological:  Positive for weakness. Negative for light-headedness and numbness.  Psychiatric/Behavioral:  Negative for dysphoric mood and suicidal ideas.    Objective:  BP (!) 157/87   Pulse 74   Ht 5\' 5"  (1.651 m)   Wt 168 lb 12.8 oz (76.6 kg)   SpO2 100%   BMI 28.09 kg/m   BP/Weight 05/31/2021 05/26/2021 28/10/1515  Systolic BP 616 073 710  Diastolic BP 87 71 74  Wt. (Lbs) 168.8 - -  BMI 28.09 - -      Physical Exam Constitutional:      Appearance: He is well-developed.  Cardiovascular:     Rate and Rhythm: Normal rate.     Heart sounds: Normal heart sounds. No murmur heard. Pulmonary:     Effort: Pulmonary effort is normal.     Breath sounds: Normal breath sounds. No wheezing or rales.  Chest:     Chest wall: No tenderness.  Abdominal:     General: Bowel sounds are normal. There is no distension.     Palpations: Abdomen is soft. There is no mass.     Tenderness: There is no abdominal tenderness.  Musculoskeletal:        General: Swelling (L dorsum) present. Normal range of motion.     Right lower leg: No edema.     Left lower leg: Edema (1+) present.     Comments: L spastic hemiparesis  Neurological:     Mental Status: He is alert and oriented to person, place, and time.  Psychiatric:        Mood and Affect: Mood normal.    CMP Latest Ref Rng & Units 05/12/2021 04/14/2021 03/10/2021  Glucose 70 - 99 mg/dL 191(H) 191(H) 173(H)  BUN 6 - 20 mg/dL 35(H) 44(H) 43(H)  Creatinine 0.61 - 1.24 mg/dL 3.74(H) 4.00(H) 3.92(H)  Sodium 135 - 145 mmol/L 137 136 139  Potassium 3.5 - 5.1 mmol/L 3.7 3.8 3.6  Chloride 98 - 111 mmol/L 107 103 107  CO2 22 - 32 mmol/L 22 24 25   Calcium 8.9 - 10.3 mg/dL 9.1 9.2 9.3  Total Protein 6.0 - 8.5 g/dL - - -  Total Bilirubin 0.0 - 1.2 mg/dL - - -  Alkaline Phos 44 - 121 IU/L - - -  AST 0 - 40 IU/L - - -  ALT 0 -  44 IU/L - - -    Lipid Panel     Component Value Date/Time   CHOL 699 (H) 04/16/2020 0558   CHOL 382 (H) 08/19/2019 1526   TRIG 298 (H) 04/16/2020 0558   HDL 40 (L) 04/16/2020 0558   HDL 116 08/19/2019 1526   CHOLHDL 17.5 04/16/2020 0558   VLDL 60 (H) 04/16/2020 6269  LDLCALC 599 (H) 04/16/2020 0558   LDLCALC 209 (H) 08/19/2019 1526    CBC    Component Value Date/Time   WBC 10.8 (H) 05/12/2021 1315   RBC 4.34 05/12/2021 1315   HGB 12.4 (L) 05/26/2021 1301   HGB 9.8 (L) 06/07/2020 1551   HCT 35.8 (L) 05/12/2021 1315   HCT 30.3 (L) 06/07/2020 1551   PLT 350 05/12/2021 1315   PLT 471 (H) 06/07/2020 1551   MCV 82.5 05/12/2021 1315   MCV 84 06/07/2020 1551   MCH 26.7 05/12/2021 1315   MCHC 32.4 05/12/2021 1315   RDW 14.3 05/12/2021 1315   RDW 14.1 06/07/2020 1551   LYMPHSABS 2.7 05/12/2021 1315   LYMPHSABS 1.8 06/07/2020 1551   MONOABS 0.9 05/12/2021 1315   EOSABS 0.3 05/12/2021 1315   EOSABS 0.2 06/07/2020 1551   BASOSABS 0.1 05/12/2021 1315   BASOSABS 0.1 06/07/2020 1551    Lab Results  Component Value Date   HGBA1C 7.1 (A) 05/31/2021    Assessment & Plan:  1. Type 2 diabetes mellitus with other specified complication, without long-term current use of insulin (HCC) Controlled with A1c of 7.1; goal is less than 7.0 Counseled on Diabetic diet, my plate method, 056 minutes of moderate intensity exercise/week Blood sugar logs with fasting goals of 80-120 mg/dl, random of less than 180 and in the event of sugars less than 60 mg/dl or greater than 400 mg/dl encouraged to notify the clinic. Advised on the need for annual eye exams, annual foot exams, Pneumonia vaccine. - POCT glucose (manual entry) - POCT glycosylated hemoglobin (Hb A1C) - Ambulatory referral to Ophthalmology  2. Need for immunization against influenza - Flu Vaccine QUAD 53mo+IM (Fluarix, Fluzone & Alfiuria Quad PF)  3. Hypertension in stage 4 chronic kidney disease due to type 2 diabetes mellitus  (HCC) Uncontrolled Isosorbide added to regimen Continue other antihypertensives Follow-up with nephrologist for management of CKD Counseled on blood pressure goal of less than 130/80, low-sodium, DASH diet, medication compliance, 150 minutes of moderate intensity exercise per week. Discussed medication compliance, adverse effects. - isosorbide mononitrate (IMDUR) 30 MG 24 hr tablet; Take 1 tablet (30 mg total) by mouth daily.  Dispense: 30 tablet; Refill: 6    No orders of the defined types were placed in this encounter.   Return in about 3 months (around 08/31/2021) for medical conditions.       Charlott Rakes, MD, FAAFP. [facility] and [place], [place]   05/31/2021, 2:25 PM

## 2021-06-07 ENCOUNTER — Other Ambulatory Visit: Payer: Self-pay

## 2021-06-07 ENCOUNTER — Ambulatory Visit (INDEPENDENT_AMBULATORY_CARE_PROVIDER_SITE_OTHER): Payer: Self-pay | Admitting: Podiatry

## 2021-06-07 ENCOUNTER — Encounter: Payer: Self-pay | Admitting: Podiatry

## 2021-06-07 DIAGNOSIS — E1165 Type 2 diabetes mellitus with hyperglycemia: Secondary | ICD-10-CM

## 2021-06-07 DIAGNOSIS — N183 Chronic kidney disease, stage 3 unspecified: Secondary | ICD-10-CM

## 2021-06-07 DIAGNOSIS — B351 Tinea unguium: Secondary | ICD-10-CM

## 2021-06-07 NOTE — Progress Notes (Signed)
This patient returns to my office for at risk foot care.  This patient requires this care by a professional since this patient will be at risk due to having CKD and diabetes and CVA..  This patient is unable to cut nails himself since the patient cannot reach his nails.These nails are painful walking and wearing shoes.  This patient presents for at risk foot care today.  General Appearance  Alert, conversant and in no acute stress.  Vascular  Dorsalis pedis and posterior tibial  pulses are palpable  bilaterally.  Capillary return is within normal limits  bilaterally. Temperature is within normal limits  bilaterally.  Neurologic  Senn-Weinstein monofilament wire test within normal limits  bilaterally. Muscle power within normal limits bilaterally.  Nails Thick disfigured discolored nails with subungual debris  from hallux to fifth toes bilaterally. No evidence of bacterial infection or drainage bilaterally.  Orthopedic  No limitations of motion  feet .  No crepitus or effusions noted.  No bony pathology or digital deformities noted.  Skin  normotropic skin with no porokeratosis noted bilaterally.  No signs of infections or ulcers noted.     Onychomycosis  Pain in right toes  Pain in left toes  Consent was obtained for treatment procedures.   Mechanical debridement of nails 1-5  bilaterally performed with a nail nipper.  Filed with dremel without incident.    Return office visit   3 months                   Told patient to return for periodic foot care and evaluation due to potential at risk complications.   Gardiner Barefoot DPM

## 2021-06-08 ENCOUNTER — Other Ambulatory Visit (HOSPITAL_COMMUNITY): Payer: Self-pay | Admitting: *Deleted

## 2021-06-09 ENCOUNTER — Other Ambulatory Visit: Payer: Self-pay

## 2021-06-09 ENCOUNTER — Ambulatory Visit (HOSPITAL_COMMUNITY)
Admission: RE | Admit: 2021-06-09 | Discharge: 2021-06-09 | Disposition: A | Discharge status: Home / Self Care | Payer: Medicaid Other | Source: Ambulatory Visit | Attending: Nephrology | Admitting: Nephrology

## 2021-06-09 VITALS — BP 150/83 | HR 76 | Temp 97.6°F | Resp 16

## 2021-06-09 DIAGNOSIS — N183 Chronic kidney disease, stage 3 unspecified: Secondary | ICD-10-CM | POA: Diagnosis not present

## 2021-06-09 LAB — RENAL FUNCTION PANEL
Albumin: 3 g/dL — ABNORMAL LOW (ref 3.5–5.0)
Anion gap: 7 (ref 5–15)
BUN: 43 mg/dL — ABNORMAL HIGH (ref 6–20)
CO2: 22 mmol/L (ref 22–32)
Calcium: 9.3 mg/dL (ref 8.9–10.3)
Chloride: 108 mmol/L (ref 98–111)
Creatinine, Ser: 3.68 mg/dL — ABNORMAL HIGH (ref 0.61–1.24)
GFR, Estimated: 19 mL/min — ABNORMAL LOW (ref >60)
Glucose, Bld: 70 mg/dL (ref 70–99)
Phosphorus: 2.8 mg/dL (ref 2.5–4.6)
Potassium: 3.9 mmol/L (ref 3.5–5.1)
Sodium: 137 mmol/L (ref 135–145)

## 2021-06-09 LAB — IRON AND TIBC
Iron: 59 ug/dL (ref 45–182)
Saturation Ratios: 25 % (ref 17.9–39.5)
TIBC: 232 ug/dL — ABNORMAL LOW (ref 250–450)
UIBC: 173 ug/dL

## 2021-06-09 LAB — CBC WITH DIFFERENTIAL/PLATELET
Abs Immature Granulocytes: 0.04 10*3/uL (ref 0.00–0.07)
Basophils Absolute: 0.1 10*3/uL (ref 0.0–0.1)
Basophils Relative: 1 %
Eosinophils Absolute: 0.2 10*3/uL (ref 0.0–0.5)
Eosinophils Relative: 2 %
HCT: 36.3 % — ABNORMAL LOW (ref 39.0–52.0)
Hemoglobin: 11.8 g/dL — ABNORMAL LOW (ref 13.0–17.0)
Immature Granulocytes: 0 %
Lymphocytes Relative: 24 %
Lymphs Abs: 2.7 10*3/uL (ref 0.7–4.0)
MCH: 26.3 pg (ref 26.0–34.0)
MCHC: 32.5 g/dL (ref 30.0–36.0)
MCV: 80.8 fL (ref 80.0–100.0)
Monocytes Absolute: 0.6 10*3/uL (ref 0.1–1.0)
Monocytes Relative: 5 %
Neutro Abs: 7.7 10*3/uL (ref 1.7–7.7)
Neutrophils Relative %: 68 %
Platelets: 385 10*3/uL (ref 150–400)
RBC: 4.49 MIL/uL (ref 4.22–5.81)
RDW: 15.4 % (ref 11.5–15.5)
WBC: 11.4 10*3/uL — ABNORMAL HIGH (ref 4.0–10.5)
nRBC: 0 % (ref 0.0–0.2)

## 2021-06-09 LAB — POCT HEMOGLOBIN-HEMACUE: Hemoglobin: 12.1 g/dL — ABNORMAL LOW (ref 13.0–17.0)

## 2021-06-09 LAB — FERRITIN: Ferritin: 408 ng/mL — ABNORMAL HIGH (ref 24–336)

## 2021-06-09 MED ORDER — EPOETIN ALFA-EPBX 10000 UNIT/ML IJ SOLN: 20000.0000 [IU] | Freq: Once | INTRAMUSCULAR | Status: DC

## 2021-06-23 ENCOUNTER — Encounter (HOSPITAL_COMMUNITY)
Admission: RE | Admit: 2021-06-23 | Discharge: 2021-06-23 | Disposition: A | Discharge status: Home / Self Care | Payer: Medicaid Other | Source: Ambulatory Visit | Attending: Nephrology | Admitting: Nephrology

## 2021-06-23 ENCOUNTER — Other Ambulatory Visit: Payer: Self-pay

## 2021-06-23 VITALS — BP 132/74 | HR 75 | Resp 18

## 2021-06-23 DIAGNOSIS — N183 Chronic kidney disease, stage 3 unspecified: Secondary | ICD-10-CM | POA: Insufficient documentation

## 2021-06-23 MED ORDER — EPOETIN ALFA-EPBX 10000 UNIT/ML IJ SOLN
INTRAMUSCULAR | Status: AC
  Filled 2021-06-23: qty 2

## 2021-06-23 MED ORDER — EPOETIN ALFA-EPBX 10000 UNIT/ML IJ SOLN
20000.0000 [IU] | Freq: Once | INTRAMUSCULAR | Status: AC
  Administered 2021-06-23: 20000 [IU] via SUBCUTANEOUS

## 2021-06-26 LAB — POCT HEMOGLOBIN-HEMACUE: Hemoglobin: 11.3 g/dL — ABNORMAL LOW (ref 13.0–17.0)

## 2021-07-02 ENCOUNTER — Other Ambulatory Visit: Payer: Self-pay | Admitting: Family Medicine

## 2021-07-02 DIAGNOSIS — K2100 Gastro-esophageal reflux disease with esophagitis, without bleeding: Secondary | ICD-10-CM

## 2021-07-02 DIAGNOSIS — N184 Chronic kidney disease, stage 4 (severe): Secondary | ICD-10-CM

## 2021-07-03 NOTE — Telephone Encounter (Signed)
Requested Prescriptions  Pending Prescriptions Disp Refills  . omeprazole (PRILOSEC) 40 MG capsule [Pharmacy Med Name: Omeprazole 40 MG Oral Capsule Delayed Release] 90 capsule 0    Sig: Take 1 capsule by mouth once daily     Gastroenterology: Proton Pump Inhibitors Passed - 07/02/2021 10:40 AM      Passed - Valid encounter within last 12 months    Recent Outpatient Visits          1 month ago Type 2 diabetes mellitus with other specified complication, without long-term current use of insulin ([place])   [place], [place]ab[place], MD   4 months ago Type 2 diabetes mellitus with other specified complication, without long-term current use of insulin ([place])   [place] City, Osage, MD   7 months ago Type 2 diabetes mellitus with hyperglycemia, without long-term current use of insulin ([place])   [place], Hardwick, MD   10 months ago Need for vaccination against Streptococcus pneumoniae   Tyro, [name] L, RPH-CPP   10 months ago Type 2 diabetes mellitus with hyperglycemia, without long-term current use of insulin ([place])   [place], Enobong, MD      Future Appointments            In 2 months Charlott Rakes, MD [place]           . sodium bicarbonate 650 MG tablet [Pharmacy Med Name: Sodium Bicarbonate 650 MG Oral Tablet] 360 tablet 0    Sig: Take 2 tablets by mouth twice daily     Endocrinology:  Minerals Passed - 07/02/2021 10:40 AM      Passed - Valid encounter within last 12 months    Recent Outpatient Visits          1 month ago Type 2 diabetes mellitus with other specified complication, without long-term current use of insulin ([place])   [place], [place], MD   4 months ago Type 2 diabetes mellitus with other specified  complication, without long-term current use of insulin ([place])   [place], [place], MD   7 months ago Type 2 diabetes mellitus with hyperglycemia, without long-term current use of insulin ([place])   [place]on, Charlane Ferretti, MD   10 months ago Need for vaccination against Streptococcus pneumoniae   [place], [name] L, RPH-CPP   10 months ago Type 2 diabetes mellitus with hyperglycemia, without long-term current use of insulin ([place])   Villanueva, Enobong, MD      Future Appointments            In 2 months Charlott Rakes, MD [place]

## 2021-07-07 ENCOUNTER — Ambulatory Visit (HOSPITAL_COMMUNITY)
Admission: RE | Admit: 2021-07-07 | Discharge: 2021-07-07 | Disposition: A | Discharge status: Home / Self Care | Payer: Medicaid Other | Source: Ambulatory Visit | Attending: Nephrology | Admitting: Nephrology

## 2021-07-07 ENCOUNTER — Encounter (HOSPITAL_COMMUNITY): Payer: Self-pay

## 2021-07-07 ENCOUNTER — Other Ambulatory Visit: Payer: Self-pay

## 2021-07-07 VITALS — BP 159/86 | HR 79 | Temp 97.6°F | Resp 18

## 2021-07-07 DIAGNOSIS — N183 Chronic kidney disease, stage 3 unspecified: Secondary | ICD-10-CM | POA: Diagnosis not present

## 2021-07-07 LAB — RENAL FUNCTION PANEL
Albumin: 2.8 g/dL — ABNORMAL LOW (ref 3.5–5.0)
Anion gap: 9 (ref 5–15)
BUN: 36 mg/dL — ABNORMAL HIGH (ref 6–20)
CO2: 22 mmol/L (ref 22–32)
Calcium: 9.1 mg/dL (ref 8.9–10.3)
Chloride: 108 mmol/L (ref 98–111)
Creatinine, Ser: 3.64 mg/dL — ABNORMAL HIGH (ref 0.61–1.24)
GFR, Estimated: 19 mL/min — ABNORMAL LOW (ref >60)
Glucose, Bld: 116 mg/dL — ABNORMAL HIGH (ref 70–99)
Phosphorus: 3.1 mg/dL (ref 2.5–4.6)
Potassium: 3.6 mmol/L (ref 3.5–5.1)
Sodium: 139 mmol/L (ref 135–145)

## 2021-07-07 LAB — CBC WITH DIFFERENTIAL/PLATELET
Abs Immature Granulocytes: 0.05 10*3/uL (ref 0.00–0.07)
Basophils Absolute: 0.1 10*3/uL (ref 0.0–0.1)
Basophils Relative: 1 %
Eosinophils Absolute: 0.3 10*3/uL (ref 0.0–0.5)
Eosinophils Relative: 3 %
HCT: 35.2 % — ABNORMAL LOW (ref 39.0–52.0)
Hemoglobin: 11.2 g/dL — ABNORMAL LOW (ref 13.0–17.0)
Immature Granulocytes: 1 %
Lymphocytes Relative: 27 %
Lymphs Abs: 2.9 10*3/uL (ref 0.7–4.0)
MCH: 26 pg (ref 26.0–34.0)
MCHC: 31.8 g/dL (ref 30.0–36.0)
MCV: 81.7 fL (ref 80.0–100.0)
Monocytes Absolute: 0.7 10*3/uL (ref 0.1–1.0)
Monocytes Relative: 7 %
Neutro Abs: 6.7 10*3/uL (ref 1.7–7.7)
Neutrophils Relative %: 61 %
Platelets: 401 10*3/uL — ABNORMAL HIGH (ref 150–400)
RBC: 4.31 MIL/uL (ref 4.22–5.81)
RDW: 16.4 % — ABNORMAL HIGH (ref 11.5–15.5)
WBC: 10.8 10*3/uL — ABNORMAL HIGH (ref 4.0–10.5)
nRBC: 0 % (ref 0.0–0.2)

## 2021-07-07 LAB — POCT HEMOGLOBIN-HEMACUE: Hemoglobin: 11.5 g/dL — ABNORMAL LOW (ref 13.0–17.0)

## 2021-07-07 MED ORDER — EPOETIN ALFA-EPBX 10000 UNIT/ML IJ SOLN
20000.0000 [IU] | Freq: Once | INTRAMUSCULAR | Status: AC
  Administered 2021-07-07: 20000 [IU] via SUBCUTANEOUS

## 2021-07-07 MED ORDER — EPOETIN ALFA-EPBX 10000 UNIT/ML IJ SOLN
INTRAMUSCULAR | Status: AC
  Filled 2021-07-07: qty 2

## 2021-07-09 ENCOUNTER — Other Ambulatory Visit: Payer: Self-pay | Admitting: Family Medicine

## 2021-07-09 DIAGNOSIS — I129 Hypertensive chronic kidney disease with stage 1 through stage 4 chronic kidney disease, or unspecified chronic kidney disease: Secondary | ICD-10-CM

## 2021-07-09 NOTE — Telephone Encounter (Signed)
last RF 02/28/21 #60 with 6 RF-- too soon 3 RF left   Requested Prescriptions  Refused Prescriptions Disp Refills  . carvedilol (COREG) 25 MG tablet [Pharmacy Med Name: Carvedilol 25 MG Oral Tablet] 60 tablet 0    Sig: TAKE 1 TABLET BY MOUTH TWICE DAILY WITH A MEAL     Cardiovascular:  Beta Blockers Failed - 07/09/2021  9:43 AM      Failed - Last BP in normal range    BP Readings from Last 1 Encounters:  07/07/21 (!) 159/86         Passed - Last Heart Rate in normal range    Pulse Readings from Last 1 Encounters:  07/07/21 79         Passed - Valid encounter within last 6 months    Recent Outpatient Visits          1 month ago Type 2 diabetes mellitus with other specified complication, without long-term current use of insulin ([place])   [place]beria, Longdale, MD   4 months ago Type 2 diabetes mellitus with other specified complication, without long-term current use of insulin ([place])   Lyons, [place] Mandry, MD   7 months ago Type 2 diabetes mellitus with hyperglycemia, without long-term current use of insulin ([place])   [place]ima, Efland, MD   10 months ago Need for vaccination against Streptococcus pneumoniae   [place], [name] L, RPH-CPP   10 months ago Type 2 diabetes mellitus with hyperglycemia, without long-term current use of insulin ([place])   [place], MD      Future Appointments            In 1 month Charlott Rakes, MD [place]

## 2021-07-11 ENCOUNTER — Other Ambulatory Visit: Payer: Self-pay | Admitting: Family Medicine

## 2021-07-11 DIAGNOSIS — N184 Chronic kidney disease, stage 4 (severe): Secondary | ICD-10-CM

## 2021-07-11 DIAGNOSIS — E1122 Type 2 diabetes mellitus with diabetic chronic kidney disease: Secondary | ICD-10-CM

## 2021-07-11 NOTE — Telephone Encounter (Signed)
Copied from [place] [id number]. Topic: Quick Communication - Rx Refill/Question >> Jul 11, 2021  4:48 PM Tessa Lerner A wrote: Medication: carvedilol (COREG) 25 MG tablet [[id number]]   Has the patient contacted their pharmacy? Yes.  The patient's mother was directed to contact their PCP (Agent: If no, request that the patient contact the pharmacy for the refill. If patient does not wish to contact the pharmacy document the reason why and proceed with request.) (Agent: If yes, when and what did the pharmacy advise?)  Preferred Pharmacy (with phone number or street name): [facility] [address]  Phone:  [phone] Fax:  [phone]    Has the patient been seen for an appointment in the last year OR does the patient have an upcoming appointment? Yes.    Agent: Please be advised that RX refills may take up to 3 business days. We ask that you follow-up with your pharmacy.

## 2021-07-12 MED ORDER — CARVEDILOL 25 MG PO TABS: 25.0000 mg | ORAL_TABLET | Freq: Two times a day (BID) | ORAL | 1 refills | Status: DC

## 2021-07-12 NOTE — Telephone Encounter (Signed)
Requested by interface surescripts, duplicate request. Signed today 07/12/21. Requested Prescriptions  Refused Prescriptions Disp Refills  . carvedilol (COREG) 25 MG tablet [Pharmacy Med Name: Carvedilol 25 MG Oral Tablet] 60 tablet 0    Sig: TAKE 1 TABLET BY MOUTH TWICE DAILY WITH A MEAL     Cardiovascular:  Beta Blockers Failed - 07/11/2021  4:21 PM      Failed - Last BP in normal range    BP Readings from Last 1 Encounters:  07/07/21 (!) 159/86         Passed - Last Heart Rate in normal range    Pulse Readings from Last 1 Encounters:  07/07/21 79         Passed - Valid encounter within last 6 months    Recent Outpatient Visits          1 month ago Type 2 diabetes mellitus with other specified complication, without long-term current use of insulin ([name])   Northwood, Grants, MD   4 months ago Type 2 diabetes mellitus with other specified complication, without long-term current use of insulin ([name])   Olivette, Auburn, MD   7 months ago Type 2 diabetes mellitus with hyperglycemia, without long-term current use of insulin ([name])   [place] Lake, [place]ound City, MD   10 months ago Need for vaccination against Streptococcus pneumoniae   [place], [name] L, RPH-CPP   10 months ago Type 2 diabetes mellitus with hyperglycemia, without long-term current use of insulin ([name])   [name], MD      Future Appointments            In 1 month Charlott Rakes, MD [place]

## 2021-07-12 NOTE — Telephone Encounter (Signed)
Requested Prescriptions  Pending Prescriptions Disp Refills  . carvedilol (COREG) 25 MG tablet 60 tablet 1    Sig: Take 1 tablet (25 mg total) by mouth 2 (two) times daily with a meal.     Cardiovascular:  Beta Blockers Failed - 07/11/2021  5:22 PM      Failed - Last BP in normal range    BP Readings from Last 1 Encounters:  07/07/21 (!) 159/86         Passed - Last Heart Rate in normal range    Pulse Readings from Last 1 Encounters:  07/07/21 79         Passed - Valid encounter within last 6 months    Recent Outpatient Visits          1 month ago Type 2 diabetes mellitus with other specified complication, without long-term current use of insulin ([place])   Americus, Amity, MD   4 months ago Type 2 diabetes mellitus with other specified complication, without long-term current use of insulin ([place])   [place]igerville, [place], MD   7 months ago Type 2 diabetes mellitus with hyperglycemia, without long-term current use of insulin ([place])   [place], Charlane Ferretti, MD   10 months ago Need for vaccination against Streptococcus pneumoniae   [place], [name] L, RPH-CPP   10 months ago Type 2 diabetes mellitus with hyperglycemia, without long-term current use of insulin ([employer])   [place], Enobong, MD      Future Appointments            In 1 month Charlott Rakes, MD [place]

## 2021-08-04 ENCOUNTER — Ambulatory Visit (HOSPITAL_COMMUNITY)
Admission: RE | Admit: 2021-08-04 | Discharge: 2021-08-04 | Disposition: A | Discharge status: Home / Self Care | Payer: Medicaid Other | Source: Ambulatory Visit | Attending: Nephrology | Admitting: Nephrology

## 2021-08-04 ENCOUNTER — Other Ambulatory Visit: Payer: Self-pay

## 2021-08-04 VITALS — BP 152/74 | HR 70 | Temp 97.1°F | Resp 19

## 2021-08-04 DIAGNOSIS — N183 Chronic kidney disease, stage 3 unspecified: Secondary | ICD-10-CM | POA: Insufficient documentation

## 2021-08-04 LAB — POCT HEMOGLOBIN-HEMACUE: Hemoglobin: 10.7 g/dL — ABNORMAL LOW (ref 13.0–17.0)

## 2021-08-04 MED ORDER — EPOETIN ALFA-EPBX 10000 UNIT/ML IJ SOLN
INTRAMUSCULAR | Status: AC
  Filled 2021-08-04: qty 2

## 2021-08-04 MED ORDER — EPOETIN ALFA-EPBX 10000 UNIT/ML IJ SOLN
20000.0000 [IU] | Freq: Once | INTRAMUSCULAR | Status: AC
  Administered 2021-08-04: 13:00:00 20000 [IU] via SUBCUTANEOUS

## 2021-08-16 DIAGNOSIS — N184 Chronic kidney disease, stage 4 (severe): Secondary | ICD-10-CM | POA: Diagnosis not present

## 2021-08-16 DIAGNOSIS — N2581 Secondary hyperparathyroidism of renal origin: Secondary | ICD-10-CM | POA: Diagnosis not present

## 2021-08-18 ENCOUNTER — Encounter (HOSPITAL_COMMUNITY): Payer: Self-pay

## 2021-08-21 ENCOUNTER — Encounter (HOSPITAL_COMMUNITY): Payer: Self-pay

## 2021-08-25 ENCOUNTER — Encounter (HOSPITAL_COMMUNITY): Payer: Self-pay

## 2021-08-28 ENCOUNTER — Encounter (HOSPITAL_COMMUNITY): Payer: Self-pay

## 2021-08-30 ENCOUNTER — Encounter (HOSPITAL_COMMUNITY): Payer: Self-pay

## 2021-09-01 ENCOUNTER — Other Ambulatory Visit: Payer: Self-pay

## 2021-09-01 ENCOUNTER — Ambulatory Visit (HOSPITAL_COMMUNITY)
Admission: RE | Admit: 2021-09-01 | Discharge: 2021-09-01 | Disposition: A | Discharge status: Home / Self Care | Payer: Medicaid Other | Source: Ambulatory Visit | Attending: Nephrology | Admitting: Nephrology

## 2021-09-01 DIAGNOSIS — N183 Chronic kidney disease, stage 3 unspecified: Secondary | ICD-10-CM | POA: Diagnosis present

## 2021-09-01 LAB — CBC WITH DIFFERENTIAL/PLATELET
Abs Immature Granulocytes: 0.03 10*3/uL (ref 0.00–0.07)
Basophils Absolute: 0.1 10*3/uL (ref 0.0–0.1)
Basophils Relative: 1 %
Eosinophils Absolute: 0.3 10*3/uL (ref 0.0–0.5)
Eosinophils Relative: 3 %
HCT: 35.3 % — ABNORMAL LOW (ref 39.0–52.0)
Hemoglobin: 11.1 g/dL — ABNORMAL LOW (ref 13.0–17.0)
Immature Granulocytes: 0 %
Lymphocytes Relative: 30 %
Lymphs Abs: 2.9 10*3/uL (ref 0.7–4.0)
MCH: 26.1 pg (ref 26.0–34.0)
MCHC: 31.4 g/dL (ref 30.0–36.0)
MCV: 83.1 fL (ref 80.0–100.0)
Monocytes Absolute: 0.6 10*3/uL (ref 0.1–1.0)
Monocytes Relative: 6 %
Neutro Abs: 5.8 10*3/uL (ref 1.7–7.7)
Neutrophils Relative %: 60 %
Platelets: 389 10*3/uL (ref 150–400)
RBC: 4.25 MIL/uL (ref 4.22–5.81)
RDW: 15.4 % (ref 11.5–15.5)
WBC: 9.8 10*3/uL (ref 4.0–10.5)
nRBC: 0 % (ref 0.0–0.2)

## 2021-09-01 LAB — RENAL FUNCTION PANEL
Albumin: 2.9 g/dL — ABNORMAL LOW (ref 3.5–5.0)
Anion gap: 10 (ref 5–15)
BUN: 52 mg/dL — ABNORMAL HIGH (ref 6–20)
CO2: 23 mmol/L (ref 22–32)
Calcium: 9.2 mg/dL (ref 8.9–10.3)
Chloride: 107 mmol/L (ref 98–111)
Creatinine, Ser: 3.94 mg/dL — ABNORMAL HIGH (ref 0.61–1.24)
GFR, Estimated: 18 mL/min — ABNORMAL LOW (ref >60)
Glucose, Bld: 128 mg/dL — ABNORMAL HIGH (ref 70–99)
Phosphorus: 4 mg/dL (ref 2.5–4.6)
Potassium: 3.3 mmol/L — ABNORMAL LOW (ref 3.5–5.1)
Sodium: 140 mmol/L (ref 135–145)

## 2021-09-01 LAB — IRON AND TIBC
Iron: 61 ug/dL (ref 45–182)
Saturation Ratios: 26 % (ref 17.9–39.5)
TIBC: 237 ug/dL — ABNORMAL LOW (ref 250–450)
UIBC: 176 ug/dL

## 2021-09-01 LAB — FERRITIN: Ferritin: 399 ng/mL — ABNORMAL HIGH (ref 24–336)

## 2021-09-01 LAB — POCT HEMOGLOBIN-HEMACUE: Hemoglobin: 11.4 g/dL — ABNORMAL LOW (ref 13.0–17.0)

## 2021-09-01 MED ORDER — EPOETIN ALFA-EPBX 10000 UNIT/ML IJ SOLN
20000.0000 [IU] | Freq: Once | INTRAMUSCULAR | Status: AC
  Administered 2021-09-01: 20000 [IU] via SUBCUTANEOUS

## 2021-09-01 MED ORDER — EPOETIN ALFA-EPBX 10000 UNIT/ML IJ SOLN
INTRAMUSCULAR | Status: AC
  Filled 2021-09-01: qty 2

## 2021-09-04 ENCOUNTER — Ambulatory Visit: Payer: Self-pay | Admitting: Family Medicine

## 2021-09-05 ENCOUNTER — Encounter (HOSPITAL_COMMUNITY): Payer: Self-pay

## 2021-09-12 ENCOUNTER — Other Ambulatory Visit: Payer: Self-pay

## 2021-09-12 ENCOUNTER — Ambulatory Visit (INDEPENDENT_AMBULATORY_CARE_PROVIDER_SITE_OTHER): Payer: Medicaid Other | Admitting: Podiatry

## 2021-09-12 ENCOUNTER — Encounter: Payer: Self-pay | Admitting: Podiatry

## 2021-09-12 DIAGNOSIS — B351 Tinea unguium: Secondary | ICD-10-CM

## 2021-09-12 DIAGNOSIS — N183 Chronic kidney disease, stage 3 unspecified: Secondary | ICD-10-CM

## 2021-09-12 NOTE — Progress Notes (Signed)
This patient returns to my office for at risk foot care.  This patient requires this care by a professional since this patient will be at risk due to having CKD and diabetes and CVA..  This patient is unable to cut nails himself since the patient cannot reach his nails.These nails are painful walking and wearing shoes.  This patient presents for at risk foot care today.  General Appearance  Alert, conversant and in no acute stress.  Vascular  Dorsalis pedis and posterior tibial  pulses are palpable  bilaterally.  Capillary return is within normal limits  bilaterally. Temperature is within normal limits  bilaterally.  Neurologic  Senn-Weinstein monofilament wire test within normal limits  bilaterally. Muscle power within normal limits bilaterally.  Nails Thick disfigured discolored nails with subungual debris  from hallux to fifth toes bilaterally. No evidence of bacterial infection or drainage bilaterally.  Orthopedic  No limitations of motion  feet .  No crepitus or effusions noted.  No bony pathology or digital deformities noted.  Skin  dry scaly skin with no porokeratosis noted bilaterally.  No signs of infections or ulcers noted.     Onychomycosis  Pain in right toes  Pain in left toes  Consent was obtained for treatment procedures.   Mechanical debridement of nails 1-5  bilaterally performed with a nail nipper.  Filed with dremel without incident.    Return office visit   3 months                   Told patient to return for periodic foot care and evaluation due to potential at risk complications.   Sareen Randon DPM   

## 2021-09-13 ENCOUNTER — Encounter (HOSPITAL_COMMUNITY): Payer: Self-pay

## 2021-09-27 ENCOUNTER — Other Ambulatory Visit: Payer: Self-pay | Admitting: Family Medicine

## 2021-09-27 DIAGNOSIS — E1122 Type 2 diabetes mellitus with diabetic chronic kidney disease: Secondary | ICD-10-CM

## 2021-09-27 NOTE — Telephone Encounter (Signed)
Requested Prescriptions  Pending Prescriptions Disp Refills   furosemide (LASIX) 80 MG tablet [Pharmacy Med Name: Furosemide 80 MG Oral Tablet] 30 tablet 0    Sig: Take 1 tablet by mouth once daily     Cardiovascular:  Diuretics - Loop Failed - 09/27/2021 11:24 AM      Failed - K in normal range and within 180 days    Potassium  Date Value Ref Range Status  09/01/2021 3.3 (L) 3.5 - 5.1 mmol/L Final         Failed - Cr in normal range and within 180 days    Creatinine, Ser  Date Value Ref Range Status  09/01/2021 3.94 (H) 0.61 - 1.24 mg/dL Final   Creatinine, Urine  Date Value Ref Range Status  06/11/2020 87.72 mg/dL Final    Comment:    Performed at [facility], [place] [address] [place], [place] [address]         Failed - Mg Level in normal range and within 180 days    Magnesium  Date Value Ref Range Status  06/11/2020 1.5 (L) 1.7 - 2.4 mg/dL Final    Comment:    Performed at [facility], [place] [address], [place], [place] [address]         Failed - Last BP in normal range    BP Readings from Last 1 Encounters:  08/04/21 (!) 152/74         Passed - Ca in normal range and within 180 days    Calcium  Date Value Ref Range Status  09/01/2021 9.2 8.9 - 10.3 mg/dL Final   Calcium, Ion  Date Value Ref Range Status  04/16/2020 1.30 1.15 - 1.40 mmol/L Final         Passed - Na in normal range and within 180 days    Sodium  Date Value Ref Range Status  09/01/2021 140 135 - 145 mmol/L Final  02/28/2021 142 134 - 144 mmol/L Final         Passed - Cl in normal range and within 180 days    Chloride  Date Value Ref Range Status  09/01/2021 107 98 - 111 mmol/L Final         Passed - Valid encounter within last 6 months    Recent Outpatient Visits          3 months ago Type 2 diabetes mellitus with other specified complication, without long-term current use of insulin (HCC)   [place]errillville, [place] Valley, MD   7 months ago Type  2 diabetes mellitus with other specified complication, without long-term current use of insulin ([place])   [place]ville, [place] Ridge, MD   10 months ago Type 2 diabetes mellitus with hyperglycemia, without long-term current use of insulin ([place])   [place]unt[place], Enobong, MD   1 year ago Need for vaccination against Streptococcus pneumoniae   [place], Jarome Matin, RPH-CPP   1 year ago Type 2 diabetes mellitus with hyperglycemia, without long-term current use of insulin ([place])   [place], Enobong, MD      Future Appointments            In 4 weeks Charlott Rakes, MD [place]

## 2021-09-29 ENCOUNTER — Ambulatory Visit (HOSPITAL_COMMUNITY)
Admission: RE | Admit: 2021-09-29 | Discharge: 2021-09-29 | Disposition: A | Discharge status: Home / Self Care | Payer: Medicaid Other | Source: Ambulatory Visit | Attending: Nephrology | Admitting: Nephrology

## 2021-09-29 VITALS — BP 174/83 | HR 72

## 2021-09-29 DIAGNOSIS — N183 Chronic kidney disease, stage 3 unspecified: Secondary | ICD-10-CM | POA: Insufficient documentation

## 2021-09-29 LAB — RENAL FUNCTION PANEL
Albumin: 3.3 g/dL — ABNORMAL LOW (ref 3.5–5.0)
Anion gap: 11 (ref 5–15)
BUN: 59 mg/dL — ABNORMAL HIGH (ref 6–20)
CO2: 23 mmol/L (ref 22–32)
Calcium: 9.7 mg/dL (ref 8.9–10.3)
Chloride: 106 mmol/L (ref 98–111)
Creatinine, Ser: 4.6 mg/dL — ABNORMAL HIGH (ref 0.61–1.24)
GFR, Estimated: 15 mL/min — ABNORMAL LOW (ref >60)
Glucose, Bld: 167 mg/dL — ABNORMAL HIGH (ref 70–99)
Phosphorus: 4.2 mg/dL (ref 2.5–4.6)
Potassium: 4 mmol/L (ref 3.5–5.1)
Sodium: 140 mmol/L (ref 135–145)

## 2021-09-29 LAB — CBC WITH DIFFERENTIAL/PLATELET
Abs Immature Granulocytes: 0.04 10*3/uL (ref 0.00–0.07)
Basophils Absolute: 0.1 10*3/uL (ref 0.0–0.1)
Basophils Relative: 1 %
Eosinophils Absolute: 0.2 10*3/uL (ref 0.0–0.5)
Eosinophils Relative: 2 %
HCT: 36.3 % — ABNORMAL LOW (ref 39.0–52.0)
Hemoglobin: 11.7 g/dL — ABNORMAL LOW (ref 13.0–17.0)
Immature Granulocytes: 0 %
Lymphocytes Relative: 24 %
Lymphs Abs: 2.5 10*3/uL (ref 0.7–4.0)
MCH: 26.8 pg (ref 26.0–34.0)
MCHC: 32.2 g/dL (ref 30.0–36.0)
MCV: 83.3 fL (ref 80.0–100.0)
Monocytes Absolute: 0.6 10*3/uL (ref 0.1–1.0)
Monocytes Relative: 5 %
Neutro Abs: 7 10*3/uL (ref 1.7–7.7)
Neutrophils Relative %: 68 %
Platelets: 393 10*3/uL (ref 150–400)
RBC: 4.36 MIL/uL (ref 4.22–5.81)
RDW: 15.9 % — ABNORMAL HIGH (ref 11.5–15.5)
WBC: 10.4 10*3/uL (ref 4.0–10.5)
nRBC: 0 % (ref 0.0–0.2)

## 2021-09-29 LAB — POCT HEMOGLOBIN-HEMACUE: Hemoglobin: 11.7 g/dL — ABNORMAL LOW (ref 13.0–17.0)

## 2021-09-29 MED ORDER — EPOETIN ALFA-EPBX 10000 UNIT/ML IJ SOLN
20000.0000 [IU] | Freq: Once | INTRAMUSCULAR | Status: AC
  Administered 2021-09-29: 20000 [IU] via SUBCUTANEOUS

## 2021-09-29 MED ORDER — EPOETIN ALFA-EPBX 10000 UNIT/ML IJ SOLN
INTRAMUSCULAR | Status: AC
  Filled 2021-09-29: qty 2

## 2021-10-01 ENCOUNTER — Other Ambulatory Visit: Payer: Self-pay | Admitting: Family Medicine

## 2021-10-01 DIAGNOSIS — E1122 Type 2 diabetes mellitus with diabetic chronic kidney disease: Secondary | ICD-10-CM

## 2021-10-01 DIAGNOSIS — E1169 Type 2 diabetes mellitus with other specified complication: Secondary | ICD-10-CM

## 2021-10-01 DIAGNOSIS — K2100 Gastro-esophageal reflux disease with esophagitis, without bleeding: Secondary | ICD-10-CM

## 2021-10-13 DIAGNOSIS — I129 Hypertensive chronic kidney disease with stage 1 through stage 4 chronic kidney disease, or unspecified chronic kidney disease: Secondary | ICD-10-CM | POA: Diagnosis not present

## 2021-10-13 DIAGNOSIS — E1122 Type 2 diabetes mellitus with diabetic chronic kidney disease: Secondary | ICD-10-CM | POA: Diagnosis not present

## 2021-10-13 DIAGNOSIS — N184 Chronic kidney disease, stage 4 (severe): Secondary | ICD-10-CM | POA: Diagnosis not present

## 2021-10-13 DIAGNOSIS — D631 Anemia in chronic kidney disease: Secondary | ICD-10-CM | POA: Diagnosis not present

## 2021-10-13 DIAGNOSIS — I639 Cerebral infarction, unspecified: Secondary | ICD-10-CM | POA: Diagnosis not present

## 2021-10-13 DIAGNOSIS — N2581 Secondary hyperparathyroidism of renal origin: Secondary | ICD-10-CM | POA: Diagnosis not present

## 2021-10-16 ENCOUNTER — Other Ambulatory Visit: Payer: Self-pay | Admitting: Family Medicine

## 2021-10-16 DIAGNOSIS — E1122 Type 2 diabetes mellitus with diabetic chronic kidney disease: Secondary | ICD-10-CM

## 2021-10-16 DIAGNOSIS — N184 Chronic kidney disease, stage 4 (severe): Secondary | ICD-10-CM

## 2021-10-16 NOTE — Telephone Encounter (Signed)
Requested medications are due for refill today.  yes ? ?Requested medications are on the active medications list.  yes ? ?Last refill. 09/27/2021 #30 0 refills ? ?Future visit scheduled.   yes ? ?Notes to clinic.  Refill failed protocol d/t abnormal labs. ? ? ? ?Requested Prescriptions  ?Pending Prescriptions Disp Refills  ? furosemide (LASIX) 80 MG tablet [Pharmacy Med Name: Furosemide 80 MG Oral Tablet] 30 tablet 0  ?  Sig: Take 1 tablet by mouth once daily  ?  ? Cardiovascular:  Diuretics - Loop Failed - 10/16/2021 10:40 AM  ?  ?  Failed - Cr in normal range and within 180 days  ?  Creatinine, Ser  ?Date Value Ref Range Status  ?09/29/2021 4.60 (H) 0.61 - 1.24 mg/dL Final  ? ?Creatinine, Urine  ?Date Value Ref Range Status  ?06/11/2020 87.72 mg/dL Final  ?  Comment:  ?  Performed at [facility], [place] [address] [place], [place] [address]  ?  ?  ?  ?  Failed - Mg Level in normal range and within 180 days  ?  Magnesium  ?Date Value Ref Range Status  ?06/11/2020 1.5 (L) 1.7 - 2.4 mg/dL Final  ?  Comment:  ?  Performed at [facility], [place] [address], [place], [place] [address]  ?  ?  ?  ?  Failed - Last BP in normal range  ?  BP Readings from Last 1 Encounters:  ?09/29/21 (!) 174/83  ?  ?  ?  ?  Passed - K in normal range and within 180 days  ?  Potassium  ?Date Value Ref Range Status  ?09/29/2021 4.0 3.5 - 5.1 mmol/L Final  ?  ?  ?  ?  Passed - Ca in normal range and within 180 days  ?  Calcium  ?Date Value Ref Range Status  ?09/29/2021 9.7 8.9 - 10.3 mg/dL Final  ? ?Calcium, Ion  ?Date Value Ref Range Status  ?04/16/2020 1.30 1.15 - 1.40 mmol/L Final  ?  ?  ?  ?  Passed - Na in normal range and within 180 days  ?  Sodium  ?Date Value Ref Range Status  ?09/29/2021 140 135 - 145 mmol/L Final  ?02/28/2021 142 134 - 144 mmol/L Final  ?  ?  ?  ?  Passed - Cl in normal range and within 180 days  ?  Chloride  ?Date Value Ref Range Status  ?09/29/2021 106 98 - 111 mmol/L Final  ?  ?  ?  ?  Passed - Valid  encounter within last 6 months  ?  Recent Outpatient Visits   ? ?      ? 4 months ago Type 2 diabetes mellitus with other specified complication, without long-term current use of insulin ([place])  ? [place], Charlane Ferretti, MD  ? 7 months ago Type 2 diabetes mellitus with other specified complication, without long-term current use of insulin ([place])  ? [place], Charlane Ferretti, MD  ? 10 months ago Type 2 diabetes mellitus with hyperglycemia, without long-term current use of insulin ([place])  ? [place], MD  ? 1 year ago Need for vaccination against Streptococcus pneumoniae  ? [place], RPH-CPP  ? 1 year ago Type 2 diabetes mellitus with hyperglycemia, without long-term current use of insulin ([place])  ? [facility] Charlott Rakes, MD  ? ?  ?  ?Future Appointments   ? ?        ? In 1 week Charlott Rakes, MD [place]  ? ?  ? ?  ?  ?  ?  ?

## 2021-10-25 ENCOUNTER — Other Ambulatory Visit: Payer: Self-pay

## 2021-10-25 ENCOUNTER — Ambulatory Visit: Payer: Medicaid Other | Attending: Family Medicine | Admitting: Family Medicine

## 2021-10-25 VITALS — BP 180/80 | HR 78 | Ht 65.0 in | Wt 160.0 lb

## 2021-10-25 DIAGNOSIS — E1169 Type 2 diabetes mellitus with other specified complication: Secondary | ICD-10-CM

## 2021-10-25 DIAGNOSIS — N184 Chronic kidney disease, stage 4 (severe): Secondary | ICD-10-CM

## 2021-10-25 DIAGNOSIS — I69354 Hemiplegia and hemiparesis following cerebral infarction affecting left non-dominant side: Secondary | ICD-10-CM | POA: Diagnosis not present

## 2021-10-25 DIAGNOSIS — E1122 Type 2 diabetes mellitus with diabetic chronic kidney disease: Secondary | ICD-10-CM

## 2021-10-25 DIAGNOSIS — I129 Hypertensive chronic kidney disease with stage 1 through stage 4 chronic kidney disease, or unspecified chronic kidney disease: Secondary | ICD-10-CM | POA: Diagnosis not present

## 2021-10-25 LAB — POCT GLYCOSYLATED HEMOGLOBIN (HGB A1C): HbA1c, POC (controlled diabetic range): 6.8 % (ref 0.0–7.0)

## 2021-10-25 LAB — GLUCOSE, POCT (MANUAL RESULT ENTRY): POC Glucose: 92 mg/dl (ref 70–99)

## 2021-10-25 MED ORDER — ISOSORBIDE MONONITRATE ER 30 MG PO TB24: 30.0000 mg | ORAL_TABLET | Freq: Every day | ORAL | 1 refills | Status: DC

## 2021-10-25 MED ORDER — GLIPIZIDE 5 MG PO TABS: 5.0000 mg | ORAL_TABLET | Freq: Two times a day (BID) | ORAL | 1 refills | Status: DC

## 2021-10-25 MED ORDER — FUROSEMIDE 80 MG PO TABS: 80.0000 mg | ORAL_TABLET | Freq: Every day | ORAL | 1 refills | Status: DC

## 2021-10-25 MED ORDER — CARVEDILOL 25 MG PO TABS: 25.0000 mg | ORAL_TABLET | Freq: Two times a day (BID) | ORAL | 1 refills | Status: DC

## 2021-10-25 MED ORDER — AMLODIPINE BESYLATE 10 MG PO TABS: 10.0000 mg | ORAL_TABLET | Freq: Every day | ORAL | 1 refills | Status: DC

## 2021-10-25 MED ORDER — ATORVASTATIN CALCIUM 80 MG PO TABS: 80.0000 mg | ORAL_TABLET | Freq: Every day | ORAL | 1 refills | Status: DC

## 2021-10-25 MED ORDER — HYDRALAZINE HCL 100 MG PO TABS: 100.0000 mg | ORAL_TABLET | Freq: Three times a day (TID) | ORAL | 1 refills | Status: DC

## 2021-10-25 NOTE — Progress Notes (Signed)
? ?Subjective:  ?Patient ID: Tony Schmitt, male    DOB: 1971/07/09  Age: 51 y.o. MRN: [id number] ? ?CC: No chief complaint on file. ? ? ?HPI ?Tony Schmitt is a 51 y.o. 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 ?Interval History: ?BP is elevated and he endorses compliance with his antihypertensive.  He informs me that last week at his nephrology visit his blood pressure was 130/85. ?Currently on erythropoietin replacement therapy for his CKD. ? ?With regards to his diabetes mellitus he has no hypoglycemic symptoms, no blurry vision. He had an eye exam last week and got new glasses ?He is weak on his left side and ambulates with a cane.  Has been performing home exercises which he was taught during his physical therapy session which he concluded last month.  Denies recent falls. ?Past Medical History:  ?Diagnosis Date  ? Chronic kidney disease   ? Diabetes mellitus without complication ([place])   ? Hypertension   ? Stroke [facility])   ? ? ?Past Surgical History:  ?Procedure Laterality Date  ? BUBBLE STUDY  04/18/2020  ? Procedure: BUBBLE STUDY;  Surgeon: Sueanne Margarita, MD;  Location: [facility] ENDOSCOPY;  Service: Cardiovascular;;  ? TEE WITHOUT CARDIOVERSION N/A 04/18/2020  ? Procedure: TRANSESOPHAGEAL ECHOCARDIOGRAM (TEE);  Surgeon: Sueanne Margarita, MD;  Location: [facility];  Service: Cardiovascular;  Laterality: N/A;  ? ? ?Family History  ?Problem Relation Age of Onset  ? Diabetes Mother   ? Diabetes Father   ? Hypertension Father   ? Cancer Father   ? Heart failure Other   ? ? ?Social History  ? ?Socioeconomic History  ? Marital status: Single  ?  Spouse name: Not on file  ? Number of children: Not on file  ? Years of education: Not on file  ? Highest education level: Not on  file  ?Occupational History  ? Not on file  ?Tobacco Use  ? Smoking status: Former  ?  Types: Cigarettes, Cigars  ? Smokeless tobacco: Never  ?Vaping Use  ? Vaping Use: Never used  ?Substance and Sexual Activity  ? Alcohol use: Not Currently  ? Drug use: Never  ? Sexual activity: Not on file  ?Other Topics Concern  ? Not on file  ?Social History Narrative  ? Not on file  ? ?Social Determinants of Health  ? ?Financial Resource Strain: Not on file  ?Food Insecurity: Not on file  ?Transportation Needs: Not on file  ?Physical Activity: Not on file  ?Stress: Not on file  ?Social Connections: Not on file  ? ? ?Allergies  ?Allergen Reactions  ? Latex Itching  ? ? ?Outpatient Medications Prior to Visit  ?Medication Sig Dispense Refill  ? acetaminophen (TYLENOL) 325 MG tablet Take 2 tablets (650 mg total) by mouth every 4 (four) hours as needed for mild pain (or temp > 37.5 C (99.5 F)).    ? aspirin 81 MG chewable tablet Chew 1 tablet (81 mg total) by mouth daily. (Patient taking differently: Chew 81 mg by mouth at bedtime.) 30 tablet 0  ? omeprazole (PRILOSEC) 40 MG capsule Take 1 capsule by mouth once daily 30 capsule 0  ? polyethylene glycol (MIRALAX / GLYCOLAX) 17 g packet Take 17 g by mouth 2 (two) times daily. (  Patient taking differently: Take 17 g by mouth daily as needed for mild constipation (MIX AND DRINK).) 14 each 0  ? senna-docusate (SENOKOT-S) 8.6-50 MG tablet Take 2 tablets by mouth at bedtime.    ? sodium bicarbonate 650 MG tablet Take 2 tablets by mouth twice daily 120 tablet 0  ? Vitamin D, Ergocalciferol, (DRISDOL) 1.25 MG (50000 UNIT) CAPS capsule Take 1 capsule (50,000 Units total) by mouth every 7 (seven) days. 4 capsule 2  ? amLODipine (NORVASC) 10 MG tablet Take 1 tablet (10 mg total) by mouth daily. 30 tablet 6  ? atorvastatin (LIPITOR) 80 MG tablet TAKE 1 TABLET BY MOUTH AT BEDTIME 30 tablet 0  ? carvedilol (COREG) 25 MG tablet Take 1 tablet (25 mg total) by mouth 2 (two) times daily with a meal. 60  tablet 1  ? furosemide (LASIX) 80 MG tablet Take 1 tablet by mouth once daily 30 tablet 0  ? glipiZIDE (GLUCOTROL) 5 MG tablet Take 1 tablet (5 mg total) by mouth 2 (two) times daily before a meal. 60 tablet 6  ? hydrALAZINE (APRESOLINE) 100 MG tablet TAKE 1 TABLET BY MOUTH THREE TIMES DAILY 90 tablet 0  ? isosorbide mononitrate (IMDUR) 30 MG 24 hr tablet Take 1 tablet (30 mg total) by mouth daily. 30 tablet 6  ? ?No facility-administered medications prior to visit.  ? ? ? ?ROS ?Review of Systems  ?Constitutional:  Negative for activity change and appetite change.  ?HENT:  Negative for sinus pressure and sore throat.   ?Eyes:  Negative for visual disturbance.  ?Respiratory:  Negative for cough, chest tightness and shortness of breath.   ?Cardiovascular:  Negative for chest pain and leg swelling.  ?Gastrointestinal:  Negative for abdominal distention, abdominal pain, constipation and diarrhea.  ?Endocrine: Negative.   ?Genitourinary:  Negative for dysuria.  ?Musculoskeletal:  Negative for joint swelling and myalgias.  ?Skin:  Negative for rash.  ?Allergic/Immunologic: Negative.   ?Neurological:  Negative for weakness, light-headedness and numbness.  ?Psychiatric/Behavioral:  Negative for dysphoric mood and suicidal ideas.   ? ?Objective:  ?BP (!) 180/80   Pulse 78   Ht 5\' 5"  (1.651 m)   Wt 160 lb (72.6 kg)   SpO2 97%   BMI 26.63 kg/m?  ? ? ?  10/25/2021  ?  4:51 PM 10/25/2021  ?  4:23 PM 09/29/2021  ?  1:42 PM  ?BP/Weight  ?Systolic BP 322 025 427  ?Diastolic BP 80 92 83  ?Wt. (Lbs)  160   ?BMI  26.63 kg/m2   ? ? ? ? ?Physical Exam ?Constitutional:   ?   Appearance: He is well-developed.  ?Cardiovascular:  ?   Rate and Rhythm: Normal rate.  ?   Heart sounds: Normal heart sounds. No murmur heard. ?Pulmonary:  ?   Effort: Pulmonary effort is normal.  ?   Breath sounds: Normal breath sounds. No wheezing or rales.  ?Chest:  ?   Chest wall: No tenderness.  ?Abdominal:  ?   General: Bowel sounds are normal. There is no  distension.  ?   Palpations: Abdomen is soft. There is no mass.  ?   Tenderness: There is no abdominal tenderness.  ?Musculoskeletal:  ?   Right lower leg: Edema present.  ?   Left lower leg: No edema.  ?   Comments: Edema of left dorsum ?Reduced range of motion of left upper and left lower extremity with associated weakness  ?Neurological:  ?   Mental Status: He is alert  and oriented to person, place, and time.  ?Psychiatric:     ?   Mood and Affect: Mood normal.  ? ? ? ?  Latest Ref Rng & Units 09/29/2021  ?  1:49 PM 09/01/2021  ?  1:27 PM 07/07/2021  ?  1:39 PM  ?CMP  ?Glucose 70 - 99 mg/dL 167   128   116    ?BUN 6 - 20 mg/dL 59   52   36    ?Creatinine 0.61 - 1.24 mg/dL 4.60   3.94   3.64    ?Sodium 135 - 145 mmol/L 140   140   139    ?Potassium 3.5 - 5.1 mmol/L 4.0   3.3   3.6    ?Chloride 98 - 111 mmol/L 106   107   108    ?CO2 22 - 32 mmol/L 23   23   22     ?Calcium 8.9 - 10.3 mg/dL 9.7   9.2   9.1    ? ? ?Lipid Panel  ?   ?Component Value Date/Time  ? CHOL 699 (H) 04/16/2020 0558  ? CHOL 382 (H) 08/19/2019 1526  ? TRIG 298 (H) 04/16/2020 0558  ? HDL 40 (L) 04/16/2020 0558  ? HDL 116 08/19/2019 1526  ? CHOLHDL 17.5 04/16/2020 0558  ? VLDL 60 (H) 04/16/2020 0558  ? LDLCALC 599 (H) 04/16/2020 0558  ? LDLCALC 209 (H) 08/19/2019 1526  ? ? ?CBC ?   ?Component Value Date/Time  ? WBC 10.4 09/29/2021 1349  ? RBC 4.36 09/29/2021 1349  ? HGB 11.7 (L) 09/29/2021 1349  ? HGB 9.8 (L) 06/07/2020 1551  ? HCT 36.3 (L) 09/29/2021 1349  ? HCT 30.3 (L) 06/07/2020 1551  ? PLT 393 09/29/2021 1349  ? PLT 471 (H) 06/07/2020 1551  ? MCV 83.3 09/29/2021 1349  ? MCV 84 06/07/2020 1551  ? MCH 26.8 09/29/2021 1349  ? MCHC 32.2 09/29/2021 1349  ? RDW 15.9 (H) 09/29/2021 1349  ? RDW 14.1 06/07/2020 1551  ? LYMPHSABS 2.5 09/29/2021 1349  ? LYMPHSABS 1.8 06/07/2020 1551  ? MONOABS 0.6 09/29/2021 1349  ? EOSABS 0.2 09/29/2021 1349  ? EOSABS 0.2 06/07/2020 1551  ? BASOSABS 0.1 09/29/2021 1349  ? BASOSABS 0.1 06/07/2020 1551  ? ? ?Lab Results   ?Component Value Date  ? HGBA1C 6.8 10/25/2021  ? ? ?Assessment & Plan:  ?1. Type 2 diabetes mellitus with other specified complication, without long-term current use of insulin (HCC) ?Controlled with A1c

## 2021-10-25 NOTE — Progress Notes (Signed)
CBG-92 ?A1C-6.8 ?

## 2021-10-25 NOTE — Patient Instructions (Signed)

## 2021-10-26 ENCOUNTER — Encounter: Payer: Self-pay | Admitting: Family Medicine

## 2021-10-27 ENCOUNTER — Other Ambulatory Visit: Payer: Self-pay

## 2021-10-27 ENCOUNTER — Ambulatory Visit (HOSPITAL_COMMUNITY)
Admission: RE | Admit: 2021-10-27 | Discharge: 2021-10-27 | Disposition: A | Discharge status: Home / Self Care | Payer: Medicaid Other | Source: Ambulatory Visit | Attending: Nephrology | Admitting: Nephrology

## 2021-10-27 VITALS — BP 158/78 | HR 76 | Temp 97.8°F | Resp 20

## 2021-10-27 DIAGNOSIS — N183 Chronic kidney disease, stage 3 unspecified: Secondary | ICD-10-CM | POA: Diagnosis present

## 2021-10-27 LAB — RENAL FUNCTION PANEL
Albumin: 3.1 g/dL — ABNORMAL LOW (ref 3.5–5.0)
Anion gap: 8 (ref 5–15)
BUN: 51 mg/dL — ABNORMAL HIGH (ref 6–20)
CO2: 23 mmol/L (ref 22–32)
Calcium: 9.4 mg/dL (ref 8.9–10.3)
Chloride: 108 mmol/L (ref 98–111)
Creatinine, Ser: 3.87 mg/dL — ABNORMAL HIGH (ref 0.61–1.24)
GFR, Estimated: 18 mL/min — ABNORMAL LOW (ref >60)
Glucose, Bld: 88 mg/dL (ref 70–99)
Phosphorus: 3.8 mg/dL (ref 2.5–4.6)
Potassium: 3.6 mmol/L (ref 3.5–5.1)
Sodium: 139 mmol/L (ref 135–145)

## 2021-10-27 LAB — CBC WITH DIFFERENTIAL/PLATELET
Abs Immature Granulocytes: 0.05 10*3/uL (ref 0.00–0.07)
Basophils Absolute: 0.1 10*3/uL (ref 0.0–0.1)
Basophils Relative: 1 %
Eosinophils Absolute: 0.2 10*3/uL (ref 0.0–0.5)
Eosinophils Relative: 2 %
HCT: 36.7 % — ABNORMAL LOW (ref 39.0–52.0)
Hemoglobin: 11.9 g/dL — ABNORMAL LOW (ref 13.0–17.0)
Immature Granulocytes: 1 %
Lymphocytes Relative: 26 %
Lymphs Abs: 2.7 10*3/uL (ref 0.7–4.0)
MCH: 27 pg (ref 26.0–34.0)
MCHC: 32.4 g/dL (ref 30.0–36.0)
MCV: 83.2 fL (ref 80.0–100.0)
Monocytes Absolute: 0.8 10*3/uL (ref 0.1–1.0)
Monocytes Relative: 7 %
Neutro Abs: 6.7 10*3/uL (ref 1.7–7.7)
Neutrophils Relative %: 63 %
Platelets: 317 10*3/uL (ref 150–400)
RBC: 4.41 MIL/uL (ref 4.22–5.81)
RDW: 15.8 % — ABNORMAL HIGH (ref 11.5–15.5)
WBC: 10.5 10*3/uL (ref 4.0–10.5)
nRBC: 0 % (ref 0.0–0.2)

## 2021-10-27 LAB — POCT HEMOGLOBIN-HEMACUE: Hemoglobin: 12.2 g/dL — ABNORMAL LOW (ref 13.0–17.0)

## 2021-10-27 LAB — IRON AND TIBC
Iron: 72 ug/dL (ref 45–182)
Saturation Ratios: 32 % (ref 17.9–39.5)
TIBC: 228 ug/dL — ABNORMAL LOW (ref 250–450)
UIBC: 156 ug/dL

## 2021-10-27 LAB — FERRITIN: Ferritin: 468 ng/mL — ABNORMAL HIGH (ref 24–336)

## 2021-10-27 MED ORDER — EPOETIN ALFA-EPBX 10000 UNIT/ML IJ SOLN: 20000.0000 [IU] | Freq: Once | INTRAMUSCULAR | Status: DC

## 2021-10-29 ENCOUNTER — Other Ambulatory Visit: Payer: Self-pay | Admitting: Family Medicine

## 2021-10-29 DIAGNOSIS — E1122 Type 2 diabetes mellitus with diabetic chronic kidney disease: Secondary | ICD-10-CM

## 2021-10-29 DIAGNOSIS — E1169 Type 2 diabetes mellitus with other specified complication: Secondary | ICD-10-CM

## 2021-10-30 DIAGNOSIS — I129 Hypertensive chronic kidney disease with stage 1 through stage 4 chronic kidney disease, or unspecified chronic kidney disease: Secondary | ICD-10-CM | POA: Diagnosis not present

## 2021-10-31 NOTE — Telephone Encounter (Signed)
Requested medication (s) are due for refill today: yes ? ?Requested medication (s) are on the active medication list: yes ? ?Last refill:  10/02/21 #120/0 ? ?Future visit scheduled: yes ? ?Notes to clinic:  Unable to refill per protocol due to failed labs, no updated results. ?Atorvastatin and Hydralazine were sent to pharmacy on 10/25/21.  ? ? ?  ?Requested Prescriptions  ?Pending Prescriptions Disp Refills  ? sodium bicarbonate 650 MG tablet [Pharmacy Med Name: Sodium Bicarbonate 650 MG Oral Tablet] 120 tablet 0  ?  Sig: Take 2 tablets by mouth twice daily  ?  ? Endocrinology:  Minerals 2 Failed - 10/29/2021  1:22 PM  ?  ?  Failed - Mg Level in normal range and within 180 days  ?  Magnesium  ?Date Value Ref Range Status  ?06/11/2020 1.5 (L) 1.7 - 2.4 mg/dL Final  ?  Comment:  ?  Performed at [facility], [place] [address], [place], [place] [address]  ?  ?  ?  ?  Failed - BMP within normal limits in the last 6 months  ?  Glucose, Bld  ?Date Value Ref Range Status  ?10/27/2021 88 70 - 99 mg/dL Final  ?  Comment:  ?  Glucose reference range applies only to samples taken after fasting for at least 8 hours.  ? ?POC Glucose  ?Date Value Ref Range Status  ?10/25/2021 92 70 - 99 mg/dl Final  ? ?Glucose-Capillary  ?Date Value Ref Range Status  ?06/14/2020 136 (H) 70 - 99 mg/dL Final  ?  Comment:  ?  Glucose reference range applies only to samples taken after fasting for at least 8 hours.  ? ?Calcium  ?Date Value Ref Range Status  ?10/27/2021 9.4 8.9 - 10.3 mg/dL Final  ? ?Calcium, Ion  ?Date Value Ref Range Status  ?04/16/2020 1.30 1.15 - 1.40 mmol/L Final  ? ?Sodium  ?Date Value Ref Range Status  ?10/27/2021 139 135 - 145 mmol/L Final  ?02/28/2021 142 134 - 144 mmol/L Final  ? ?Potassium  ?Date Value Ref Range Status  ?10/27/2021 3.6 3.5 - 5.1 mmol/L Final  ? ?Chloride  ?Date Value Ref Range Status  ?10/27/2021 108 98 - 111 mmol/L Final  ? ?BUN  ?Date Value Ref Range Status  ?10/27/2021 51 (H) 6 - 20 mg/dL Final   ?02/28/2021 41 (H) 6 - 24 mg/dL Final  ? ?Creatinine, Ser  ?Date Value Ref Range Status  ?10/27/2021 3.87 (H) 0.61 - 1.24 mg/dL Final  ? ?Creatinine, Urine  ?Date Value Ref Range Status  ?06/11/2020 87.72 mg/dL Final  ?  Comment:  ?  Performed at [facility], [place] [address], [place], [place] [address]  ? ?CO2  ?Date Value Ref Range Status  ?10/27/2021 23 22 - 32 mmol/L Final  ? ?TCO2  ?Date Value Ref Range Status  ?04/16/2020 23 22 - 32 mmol/L Final  ? ?GFR calc Af Amer  ?Date Value Ref Range Status  ?06/07/2020 23 (L) >59 mL/min/1.73 Final  ?  Comment:  ?  **In accordance with recommendations from the NKF-ASN Task force,** ?  [facility] is in the process of updating its eGFR calculation to the ?  2021 CKD-EPI creatinine equation that estimates kidney function ?  without a race variable. ?  ? ?eGFR  ?Date Value Ref Range Status  ?02/28/2021 20 (L) >59 mL/min/1.73 Final  ? ?GFR, Estimated  ?Date Value Ref Range Status  ?10/27/2021 18 (L) >60 mL/min Final  ?  Comment:  ?  (  NOTE) ?Calculated using the CKD-EPI Creatinine Equation (2021) ?  ? ?  ?  ?  Passed - Valid encounter within last 12 months  ?  Recent Outpatient Visits   ? ?      ? 6 days ago Type 2 diabetes mellitus with other specified complication, without long-term current use of insulin ([place])  ? [place], Charlane Ferretti, MD  ? 5 months ago Type 2 diabetes mellitus with other specified complication, without long-term current use of insulin ([place])  ? [place], Charlane Ferretti, MD  ? 8 months ago Type 2 diabetes mellitus with other specified complication, without long-term current use of insulin ([place])  ? [place], Charlane Ferretti, MD  ? 11 months ago Type 2 diabetes mellitus with hyperglycemia, without long-term current use of insulin ([place])  ? [place], MD  ? 1 year ago Need for vaccination against Streptococcus  pneumoniae  ? [place], RPH-CPP  ? ?  ?  ?Future Appointments   ? ?        ? In 2 weeks Daisy Blossom, Jarome Matin, [place]  ? In 3 months Charlott Rakes, MD [place]  ? ?  ? ?  ?  ?  ? hydrALAZINE (APRESOLINE) 100 MG tablet [Pharmacy Med Name: hydrALAZINE HCl 100 MG Oral Tablet] 90 tablet 0  ?  Sig: TAKE 1 TABLET BY MOUTH THREE TIMES DAILY  ?  ? Cardiovascular:  Vasodilators Failed - 10/29/2021  1:22 PM  ?  ?  Failed - HCT in normal range and within 360 days  ?  HCT  ?Date Value Ref Range Status  ?10/27/2021 36.7 (L) 39.0 - 52.0 % Final  ? ?Hematocrit  ?Date Value Ref Range Status  ?06/07/2020 30.3 (L) 37.5 - 51.0 % Final  ?  ?  ?  ?  Failed - HGB in normal range and within 360 days  ?  Hemoglobin  ?Date Value Ref Range Status  ?10/27/2021 11.9 (L) 13.0 - 17.0 g/dL Final  ?06/07/2020 9.8 (L) 13.0 - 17.7 g/dL Final  ?  ?  ?  ?  Failed - ANA Screen, Ifa, Serum in normal range and within 360 days  ?  Anti Nuclear Antibody (ANA)  ?Date Value Ref Range Status  ?04/17/2020 Negative Negative Final  ?  Comment:  ?  (NOTE) ?Performed At: [facility] ?[address] [place], [place] [id number] ?Rush Farmer MD IH:[id number] ?  ?  ?  ?  ?  Failed - Last BP in normal range  ?  BP Readings from Last 1 Encounters:  ?10/27/21 (!) 158/78  ?  ?  ?  ?  Passed - RBC in normal range and within 360 days  ?  RBC  ?Date Value Ref Range Status  ?10/27/2021 4.41 4.22 - 5.81 MIL/uL Final  ?  ?  ?  ?  Passed - WBC in normal range and within 360 days  ?  WBC  ?Date Value Ref Range Status  ?10/27/2021 10.5 4.0 - 10.5 K/uL Final  ?  ?  ?  ?  Passed - PLT in normal range and within 360 days  ?  Platelets  ?Date Value Ref Range Status  ?10/27/2021 317 150 - 400 K/uL Final  ?06/07/2020 471 (H) 150 - 450 x10E3/uL Final  ? ?  Platelet Count, POC  ?Date Value Ref Range Status  ?04/16/2019 424 142 - 424 K/uL Final  ?   ?  ?  ?  Passed - Valid encounter within last 12 months  ?  Recent Outpatient Visits   ? ?      ? 6 days ago Type 2 diabetes mellitus with other specified complication, without long-term current use of insulin ([place])  ? [place], Charlane Ferretti, MD  ? 5 months ago Type 2 diabetes mellitus with other specified complication, without long-term current use of insulin ([place])  ? [place], Charlane Ferretti, MD  ? 8 months ago Type 2 diabetes mellitus with other specified complication, without long-term current use of insulin ([place])  ? [place], Charlane Ferretti, MD  ? 11 months ago Type 2 diabetes mellitus with hyperglycemia, without long-term current use of insulin ([place])  ? [place], MD  ? 1 year ago Need for vaccination against Streptococcus pneumoniae  ? [place], RPH-CPP  ? ?  ?  ?Future Appointments   ? ?        ? In 2 weeks Daisy Blossom, Jarome Matin, [place]  ? In 3 months Charlott Rakes, MD [place]  ? ?  ? ?  ?  ?  ? atorvastatin (LIPITOR) 80 MG tablet [Pharmacy Med Name: Atorvastatin Calcium 80 MG Oral Tablet] 30 tablet 0  ?  Sig: TAKE 1 TABLET BY MOUTH AT BEDTIME  ?  ? Cardiovascular:  Antilipid - Statins Failed - 10/29/2021  1:22 PM  ?  ?  Failed - Lipid Panel in normal range within the last 12 months  ?  Cholesterol, Total  ?Date Value Ref Range Status  ?08/19/2019 382 (H) 100 - 199 mg/dL Final  ? ?Cholesterol  ?Date Value Ref Range Status  ?04/16/2020 699 (H) 0 - 200 mg/dL Final  ? ?LDL Chol Calc (NIH)  ?Date Value Ref Range Status  ?08/19/2019 209 (H) 0 - 99 mg/dL Final  ? ?LDL Cholesterol  ?Date Value Ref Range Status  ?04/16/2020 599 (H) 0 - 99 mg/dL Final  ?  Comment:  ?         ?Total Cholesterol/HDL:CHD Risk ?Coronary Heart Disease Risk  Table ?                    Men   Women ? 1/2 Average Risk   3.4   3.3 ? Average Risk       5.0   4.4 ? 2 X Average Risk   9.6   7.1 ? 3 X Average Risk  23.4   11.0 ?       ?Use the calculated Patient Ratio ?above

## 2021-11-06 ENCOUNTER — Other Ambulatory Visit: Payer: Self-pay | Admitting: Family Medicine

## 2021-11-06 DIAGNOSIS — K2100 Gastro-esophageal reflux disease with esophagitis, without bleeding: Secondary | ICD-10-CM

## 2021-11-07 NOTE — Telephone Encounter (Signed)
Requested Prescriptions  ?Pending Prescriptions Disp Refills  ?? omeprazole (PRILOSEC) 40 MG capsule [Pharmacy Med Name: Omeprazole 40 MG Oral Capsule Delayed Release] 90 capsule 0  ?  Sig: Take 1 capsule by mouth once daily  ?  ? Gastroenterology: Proton Pump Inhibitors Passed - 11/06/2021 10:37 AM  ?  ?  Passed - Valid encounter within last 12 months  ?  Recent Outpatient Visits   ?      ? 1 week ago Type 2 diabetes mellitus with other specified complication, without long-term current use of insulin ([place])  ? [place], Charlane Ferretti, MD  ? 5 months ago Type 2 diabetes mellitus with other specified complication, without long-term current use of insulin ([place])  ? [place], Charlane Ferretti, MD  ? 8 months ago Type 2 diabetes mellitus with other specified complication, without long-term current use of insulin ([place])  ? [place], Charlane Ferretti, MD  ? 11 months ago Type 2 diabetes mellitus with hyperglycemia, without long-term current use of insulin ([place])  ? [place], MD  ? 1 year ago Need for vaccination against Streptococcus pneumoniae  ? [place], RPH-CPP  ?  ?  ?Future Appointments   ?        ? In 1 week Daisy Blossom, Jarome Matin, [place]  ? In 2 months Charlott Rakes, MD [place]  ?  ? ?  ?  ?  ? ? ?

## 2021-11-10 ENCOUNTER — Ambulatory Visit (HOSPITAL_COMMUNITY)
Admission: RE | Admit: 2021-11-10 | Discharge: 2021-11-10 | Disposition: A | Discharge status: Home / Self Care | Payer: Medicaid Other | Source: Ambulatory Visit | Attending: Nephrology | Admitting: Nephrology

## 2021-11-10 VITALS — BP 149/75 | HR 75 | Temp 97.6°F | Resp 18

## 2021-11-10 DIAGNOSIS — D631 Anemia in chronic kidney disease: Secondary | ICD-10-CM | POA: Diagnosis not present

## 2021-11-10 DIAGNOSIS — N183 Chronic kidney disease, stage 3 unspecified: Secondary | ICD-10-CM

## 2021-11-10 DIAGNOSIS — N184 Chronic kidney disease, stage 4 (severe): Secondary | ICD-10-CM | POA: Diagnosis present

## 2021-11-10 LAB — POCT HEMOGLOBIN-HEMACUE: Hemoglobin: 11.4 g/dL — ABNORMAL LOW (ref 13.0–17.0)

## 2021-11-10 MED ORDER — EPOETIN ALFA-EPBX 10000 UNIT/ML IJ SOLN
20000.0000 [IU] | Freq: Once | INTRAMUSCULAR | Status: AC
  Administered 2021-11-10: 20000 [IU] via SUBCUTANEOUS

## 2021-11-10 MED ORDER — EPOETIN ALFA-EPBX 10000 UNIT/ML IJ SOLN
INTRAMUSCULAR | Status: AC
  Filled 2021-11-10: qty 2

## 2021-11-14 ENCOUNTER — Encounter: Payer: Self-pay | Admitting: Pharmacist

## 2021-11-14 ENCOUNTER — Ambulatory Visit: Payer: Medicaid Other | Attending: Family Medicine | Admitting: Pharmacist

## 2021-11-14 VITALS — BP 175/78 | HR 76

## 2021-11-14 DIAGNOSIS — N184 Chronic kidney disease, stage 4 (severe): Secondary | ICD-10-CM | POA: Diagnosis not present

## 2021-11-14 DIAGNOSIS — I129 Hypertensive chronic kidney disease with stage 1 through stage 4 chronic kidney disease, or unspecified chronic kidney disease: Secondary | ICD-10-CM

## 2021-11-14 DIAGNOSIS — E1122 Type 2 diabetes mellitus with diabetic chronic kidney disease: Secondary | ICD-10-CM | POA: Diagnosis not present

## 2021-11-14 NOTE — Progress Notes (Signed)
? ?  S:    ? ?No chief complaint on file. ? ? ?Tony Schmitt is a 51 y.o. male who presents for hypertension evaluation, education, and management. 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 Patient was referred and last seen by Primary Care Provider, Dr. Margarita Rana, on 10/25/2021. BP was 180/80.  ? ?Today, patient arrives in good spirits. Ambulates with assistance. Denies dizziness, headache, blurred vision, swelling.  ? ?Patient reports hypertension is longstanding.  ? ?Family/Social history:  ?Fhx: DM, HTN, HF  ?Tobacco: former smoker  ?Alcohol: none reported  ? ?Medication adherence reported, however, patient has not taken BP medications today.  ? ?Current antihypertensives include: amlodipine 10 mg daily, carvedilol 25 mg BID, hydralazine 100 mg TID ? ?Patient reported dietary habits:  ?-Compliant with with salt restriction ?-Drinks 1 cup of coffee per day  ? ?Patient-reported exercise habits: limited d/t impaired mobility  ? ?O:  ?Home BP readings:  ?SBP range: 163 - 181 ?DBP range: 77 - 89  ?He brings a log of home BP today with values as shown above. He tells me that he takes his BP in the morning before taking his BP. ? ?Vitals:  ? 11/14/21 0923  ?BP: (!) 175/78  ?Pulse: 76  ? ?Last 3 Office BP readings: ?BP Readings from Last 3 Encounters:  ?11/14/21 (!) 175/78  ?11/10/21 (!) 149/75  ?10/27/21 (!) 158/78  ? ? ?BMET ?   ?Component Value Date/Time  ? NA 139 10/27/2021 1344  ? NA 142 02/28/2021 1430  ? K 3.6 10/27/2021 1344  ? CL 108 10/27/2021 1344  ? CO2 23 10/27/2021 1344  ? GLUCOSE 88 10/27/2021 1344  ? BUN 51 (H) 10/27/2021 1344  ? BUN 41 (H) 02/28/2021 1430  ? CREATININE 3.87 (H) 10/27/2021 1344  ? CALCIUM 9.4 10/27/2021 1344  ? GFRNONAA 18 (L) 10/27/2021 1344  ? GFRAA 23 (L)  06/07/2020 1551  ? ? ?Renal function: ?CrCl cannot be calculated (Unknown ideal weight.). ? ?Clinical ASCVD: Yes  ?The ASCVD Risk score (Arnett DK, et al., 2019) failed to calculate for the following reasons: ?  The patient has a prior MI or stroke diagnosis ? ?A/P: ?Hypertension diagnosed currently uncontrolled on current medications. BP goal < 130/80 mmHg. Medication adherence appears appropriate but he has not taken his antihypertensives today. He is checking his BP at home before taking his BP medications for the day, therefore, I will make no changes today. Encouraged pt to take his medications and then check his BP 2-3 hours after. He was also encouraged to take his BP medications before his clinic visits.   ?-Continued current regimen.  ?-Adherence encouraged as noted above.  ?-F/u labs ordered - none ?-Counseled on lifestyle modifications for blood pressure control including reduced dietary sodium, increased exercise, adequate sleep. ?-Encouraged patient to check BP at home and bring log of readings to next visit. Counseled on proper use of home BP cuff.  ? ?Results reviewed and written information provided. Patient verbalized understanding of treatment plan. Total time in face-to-face counseling 30 minutes.  ? ?F/u clinic visit in 3-4 weeks. ? ?Benard Halsted, PharmD, BCACP, CPP ?Clinical Pharmacist ?[place] ?[phone] ? ? ?

## 2021-11-23 ENCOUNTER — Telehealth: Payer: Self-pay | Admitting: Pharmacist

## 2021-11-23 DIAGNOSIS — I10 Essential (primary) hypertension: Secondary | ICD-10-CM

## 2021-11-23 NOTE — Telephone Encounter (Signed)
Patient appearing on report for [employer] - Hypertension Control report due to last documented ambulatory blood pressure of 175/78 on 11/14/21. Next appointment with PCP is 01/31/22 and he is scheduled to see the embedded pharmacist on 12/05/21 ? ?Outreached patient to discuss hypertension control and medication management. Left voicemail for him ot return my call at his convenience.  ? ?Catie Hedwig Morton, PharmD, BCACP ?[place] ?[phone]' ? ? ?

## 2021-11-24 ENCOUNTER — Encounter (HOSPITAL_COMMUNITY): Payer: Medicaid Other

## 2021-11-30 ENCOUNTER — Other Ambulatory Visit: Payer: Self-pay | Admitting: Family Medicine

## 2021-11-30 DIAGNOSIS — E1122 Type 2 diabetes mellitus with diabetic chronic kidney disease: Secondary | ICD-10-CM

## 2021-11-30 DIAGNOSIS — N184 Chronic kidney disease, stage 4 (severe): Secondary | ICD-10-CM

## 2021-11-30 NOTE — Telephone Encounter (Signed)
Attempted another call to patient. Left voicemail for patient to return my call at his convenience.  ?

## 2021-11-30 NOTE — Telephone Encounter (Signed)
Noted, thank you so much.

## 2021-11-30 NOTE — Telephone Encounter (Signed)
Patient appearing on report for [employer] - Hypertension Control report due to last documented ambulatory blood pressure of 175/78 on 11/14/21. Next appointment with PCP is 01/31/22. He is scheduled to see the embedded pharmacist on 12/05/21  ? ?Outreached patient to discuss hypertension control and medication management.  ? ?Current antihypertensives: amlodipine 10 mg daily, carvedilol 25 mg twice daily, hydralazine 100 mg three times daily, isosorbide 30 mg daily, clonidine 0.1 mg daily  ? ?Patient has an automated upper arm home BP machine. He has never brought it to the clinic for validation. ? ?Current blood pressure readings:  ?- 4/22 (before meds): 175/84 80; after meds; 158/75 80 ?- 4/23 155/82 81; 140/63 74  ?- 4/24 182/88 79 ;  ?- 4/25: 190/94 79; 147/68 76 ?- 4/26: 173/79 81;  ?- 4/27: 167/77 76; 137/53 77 ? ?9:30 am - morning medications ?~ 3:30-4 pm - midday hydralazine  ?11 pm - bedtime medications ? ? ?Patient denies hypotensive signs and symptoms including dizziness, lightheadedness.  ?Patient denies hypertensive symptoms including headache, chest pain, shortness of breath. ? ?Patient denies side effects related to any of his medications  ? ?Assessment/Plan: ?- Currently uncontrolled ?- - Reviewed goal blood pressure <130/80 ?- Reviewed importance of taking medications before his appointment with the pharmacist next week ?- Praised for home BP monitoring and encouraged to take home cuff with him to the appointment next week for validation ?- Reviewed importance of adherence to prescribed regimen. Praised for blood pressure reading that he had checked just before our call today. ? ?Catie Hedwig Morton, PharmD, BCACP ?[place] ?[phone] ? ?

## 2021-11-30 NOTE — Addendum Note (Signed)
Addended by: Kaylyn Layer T on: 11/30/2021 03:23 PM ? ? Modules accepted: Orders ? ?

## 2021-12-05 ENCOUNTER — Ambulatory Visit: Payer: Medicaid Other | Attending: Family Medicine | Admitting: Pharmacist

## 2021-12-05 VITALS — BP 127/70 | HR 76

## 2021-12-05 DIAGNOSIS — E1122 Type 2 diabetes mellitus with diabetic chronic kidney disease: Secondary | ICD-10-CM | POA: Diagnosis not present

## 2021-12-05 DIAGNOSIS — I129 Hypertensive chronic kidney disease with stage 1 through stage 4 chronic kidney disease, or unspecified chronic kidney disease: Secondary | ICD-10-CM

## 2021-12-05 DIAGNOSIS — N184 Chronic kidney disease, stage 4 (severe): Secondary | ICD-10-CM

## 2021-12-05 NOTE — Progress Notes (Signed)
? ?S:    ?PCP: Dr. Margarita Rana ? ?No chief complaint on file. ? ?Tony Schmitt is a 51 y.o. male who presents for hypertension evaluation, education, and management. 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 Patient was referred and last seen by Primary Care Provider, Dr. Margarita Rana, on 10/25/2021. I saw him 11/14/2021 and made no changes. He had not taken his antihypertensives prior to that visit.  ? ?Today, patient arrives in good spirits. Ambulates with assistance. Denies dizziness, headache, blurred vision, swelling.  ? ?Patient reports hypertension is longstanding.  ? ?Family/Social history:  ?Fhx: DM, HTN, HF  ?Tobacco: former smoker  ?Alcohol: none reported  ? ?Medication adherence reported. Took his medications this morning.  ? ?Current antihypertensives include: amlodipine 10 mg daily, carvedilol 25 mg BID, hydralazine 100 mg TID ? ?Patient reported dietary habits:  ?-Compliant with with salt restriction ?-Drinks 1 cup of coffee per day  ? ?Patient-reported exercise habits: limited d/t impaired mobility  ?O:  ?Home BP readings:  ?SBP range: 137 - 175  ?DBP range: 68 - 84  ?He brings a log of home BP today with values as shown above. He tells me that he takes his BP in the morning before taking his BP. Of note, he tells me he purchased his home BP cuff ~2008.  ? ?Vitals:  ? 12/05/21 1422  ?BP: 127/70  ?Pulse: 76  ? ? ?Last 3 Office BP readings: ?BP Readings from Last 3 Encounters:  ?12/05/21 127/70  ?11/14/21 (!) 175/78  ?11/10/21 (!) 149/75  ? ? ?BMET ?   ?Component Value Date/Time  ? NA 139 10/27/2021 1344  ? NA 142 02/28/2021 1430  ? K 3.6 10/27/2021 1344  ? CL 108 10/27/2021 1344  ? CO2 23 10/27/2021 1344  ? GLUCOSE 88 10/27/2021 1344  ? BUN 51 (H) 10/27/2021 1344  ? BUN 41 (H)  02/28/2021 1430  ? CREATININE 3.87 (H) 10/27/2021 1344  ? CALCIUM 9.4 10/27/2021 1344  ? GFRNONAA 18 (L) 10/27/2021 1344  ? GFRAA 23 (L) 06/07/2020 1551  ? ? ?Renal function: ?CrCl cannot be calculated (Patient's most recent lab result is older than the maximum 21 days allowed.). ? ?Clinical ASCVD: Yes  ?The ASCVD Risk score (Arnett DK, et al., 2019) failed to calculate for the following reasons: ?  The patient has a prior MI or stroke diagnosis ? ?A/P: ?Hypertension diagnosed currently at goal on current medications. BP goal < 130/80 mmHg. Medication adherence appears appropriate and he took his medications before his visit today. Despite this, his BP is quite surprising given the degree of elevation at his last appointment. I discussed this with his mom today. She informed that on most days he does not take his medications on a consistent schedule. He will sometimes sleep until 12p and not take his morning medications. Today, however, she had him take his medications in the morning and agrees to assist him take his medications on a tighter schedule. Will have him return in 1 month.  -Continued current regimen.  ?-Adherence encouraged as noted above. Instructed pt to always take his antihypertensives before seeing a provider in clinic.  ?-F/u labs ordered - none ?-Counseled on lifestyle modifications for blood pressure control including reduced dietary sodium, increased exercise, adequate sleep. ?-Encouraged patient to check BP at home and  bring log of readings to next visit. Counseled on proper use of home BP cuff.  ? ?Results reviewed and written information provided. Patient verbalized understanding of treatment plan. Total time in face-to-face counseling 30 minutes.  ? ?F/u clinic visit in 3-4 weeks. ? ?Benard Halsted, PharmD, BCACP, CPP ?Clinical Pharmacist ?[place] ?[phone] ? ? ?

## 2021-12-08 ENCOUNTER — Ambulatory Visit (HOSPITAL_COMMUNITY)
Admission: RE | Admit: 2021-12-08 | Discharge: 2021-12-08 | Disposition: A | Discharge status: Home / Self Care | Payer: Medicaid Other | Source: Ambulatory Visit | Attending: Nephrology | Admitting: Nephrology

## 2021-12-08 VITALS — BP 167/85 | HR 77 | Temp 97.3°F | Resp 18

## 2021-12-08 DIAGNOSIS — N183 Chronic kidney disease, stage 3 unspecified: Secondary | ICD-10-CM | POA: Diagnosis present

## 2021-12-08 LAB — CBC WITH DIFFERENTIAL/PLATELET
Abs Immature Granulocytes: 0.05 10*3/uL (ref 0.00–0.07)
Basophils Absolute: 0.1 10*3/uL (ref 0.0–0.1)
Basophils Relative: 1 %
Eosinophils Absolute: 0.3 10*3/uL (ref 0.0–0.5)
Eosinophils Relative: 3 %
HCT: 34.7 % — ABNORMAL LOW (ref 39.0–52.0)
Hemoglobin: 11.1 g/dL — ABNORMAL LOW (ref 13.0–17.0)
Immature Granulocytes: 1 %
Lymphocytes Relative: 24 %
Lymphs Abs: 2.4 10*3/uL (ref 0.7–4.0)
MCH: 26.7 pg (ref 26.0–34.0)
MCHC: 32 g/dL (ref 30.0–36.0)
MCV: 83.6 fL (ref 80.0–100.0)
Monocytes Absolute: 0.7 10*3/uL (ref 0.1–1.0)
Monocytes Relative: 7 %
Neutro Abs: 6.4 10*3/uL (ref 1.7–7.7)
Neutrophils Relative %: 64 %
Platelets: 347 10*3/uL (ref 150–400)
RBC: 4.15 MIL/uL — ABNORMAL LOW (ref 4.22–5.81)
RDW: 15.8 % — ABNORMAL HIGH (ref 11.5–15.5)
WBC: 9.9 10*3/uL (ref 4.0–10.5)
nRBC: 0 % (ref 0.0–0.2)

## 2021-12-08 LAB — RENAL FUNCTION PANEL
Albumin: 3.2 g/dL — ABNORMAL LOW (ref 3.5–5.0)
Anion gap: 9 (ref 5–15)
BUN: 54 mg/dL — ABNORMAL HIGH (ref 6–20)
CO2: 20 mmol/L — ABNORMAL LOW (ref 22–32)
Calcium: 9.5 mg/dL (ref 8.9–10.3)
Chloride: 111 mmol/L (ref 98–111)
Creatinine, Ser: 4.04 mg/dL — ABNORMAL HIGH (ref 0.61–1.24)
GFR, Estimated: 17 mL/min — ABNORMAL LOW (ref >60)
Glucose, Bld: 132 mg/dL — ABNORMAL HIGH (ref 70–99)
Phosphorus: 3.4 mg/dL (ref 2.5–4.6)
Potassium: 3.8 mmol/L (ref 3.5–5.1)
Sodium: 140 mmol/L (ref 135–145)

## 2021-12-08 LAB — IRON AND TIBC
Iron: 74 ug/dL (ref 45–182)
Saturation Ratios: 31 % (ref 17.9–39.5)
TIBC: 242 ug/dL — ABNORMAL LOW (ref 250–450)
UIBC: 168 ug/dL

## 2021-12-08 LAB — FERRITIN: Ferritin: 420 ng/mL — ABNORMAL HIGH (ref 24–336)

## 2021-12-08 LAB — POCT HEMOGLOBIN-HEMACUE: Hemoglobin: 11.3 g/dL — ABNORMAL LOW (ref 13.0–17.0)

## 2021-12-08 MED ORDER — EPOETIN ALFA-EPBX 10000 UNIT/ML IJ SOLN
20000.0000 [IU] | Freq: Once | INTRAMUSCULAR | Status: AC
  Administered 2021-12-08: 20000 [IU] via SUBCUTANEOUS

## 2021-12-08 MED ORDER — EPOETIN ALFA-EPBX 10000 UNIT/ML IJ SOLN
INTRAMUSCULAR | Status: AC
  Filled 2021-12-08: qty 2

## 2021-12-15 ENCOUNTER — Encounter: Payer: Self-pay | Admitting: Podiatry

## 2021-12-15 ENCOUNTER — Ambulatory Visit (INDEPENDENT_AMBULATORY_CARE_PROVIDER_SITE_OTHER): Payer: Medicaid Other | Admitting: Podiatry

## 2021-12-15 DIAGNOSIS — E1165 Type 2 diabetes mellitus with hyperglycemia: Secondary | ICD-10-CM

## 2021-12-15 DIAGNOSIS — N183 Chronic kidney disease, stage 3 unspecified: Secondary | ICD-10-CM | POA: Diagnosis not present

## 2021-12-15 DIAGNOSIS — B351 Tinea unguium: Secondary | ICD-10-CM | POA: Diagnosis not present

## 2021-12-15 NOTE — Progress Notes (Signed)
This patient returns to my office for at risk foot care.  This patient requires this care by a professional since this patient will be at risk due to having CKD and diabetes and CVA..  This patient is unable to cut nails himself since the patient cannot reach his nails.These nails are painful walking and wearing shoes.  This patient presents for at risk foot care today.  General Appearance  Alert, conversant and in no acute stress.  Vascular  Dorsalis pedis and posterior tibial  pulses are palpable  bilaterally.  Capillary return is within normal limits  bilaterally. Temperature is within normal limits  bilaterally.  Neurologic  Senn-Weinstein monofilament wire test within normal limits  bilaterally. Muscle power within normal limits bilaterally.  Nails Thick disfigured discolored nails with subungual debris  from hallux to fifth toes bilaterally. No evidence of bacterial infection or drainage bilaterally.  Orthopedic  No limitations of motion  feet .  No crepitus or effusions noted.  No bony pathology or digital deformities noted.  Skin  dry scaly skin with no porokeratosis noted bilaterally.  No signs of infections or ulcers noted.     Onychomycosis  Pain in right toes  Pain in left toes  Consent was obtained for treatment procedures.   Mechanical debridement of nails 1-5  bilaterally performed with a nail nipper.  Filed with dremel without incident.    Return office visit   3 months                   Told patient to return for periodic foot care and evaluation due to potential at risk complications.   Jaylynn Siefert DPM   

## 2021-12-19 DIAGNOSIS — I129 Hypertensive chronic kidney disease with stage 1 through stage 4 chronic kidney disease, or unspecified chronic kidney disease: Secondary | ICD-10-CM | POA: Diagnosis not present

## 2021-12-19 DIAGNOSIS — N184 Chronic kidney disease, stage 4 (severe): Secondary | ICD-10-CM | POA: Diagnosis not present

## 2021-12-19 DIAGNOSIS — D631 Anemia in chronic kidney disease: Secondary | ICD-10-CM | POA: Diagnosis not present

## 2021-12-19 DIAGNOSIS — I639 Cerebral infarction, unspecified: Secondary | ICD-10-CM | POA: Diagnosis not present

## 2021-12-19 DIAGNOSIS — E1122 Type 2 diabetes mellitus with diabetic chronic kidney disease: Secondary | ICD-10-CM | POA: Diagnosis not present

## 2021-12-19 DIAGNOSIS — N2581 Secondary hyperparathyroidism of renal origin: Secondary | ICD-10-CM | POA: Diagnosis not present

## 2021-12-20 ENCOUNTER — Other Ambulatory Visit: Payer: Self-pay | Admitting: Pharmacist

## 2021-12-20 VITALS — BP 103/54 | HR 72

## 2021-12-20 DIAGNOSIS — I10 Essential (primary) hypertension: Secondary | ICD-10-CM

## 2021-12-20 NOTE — Patient Instructions (Signed)
[place],  ? ?Keep up the great work! ? ?Check your blood pressure once daily, and any time you have concerning symptoms like headache, chest pain, dizziness, shortness of breath, or vision changes.  ? ?Our goal is less than 130/80. ? ?To appropriately check your blood pressure, make sure you do the following:  ?1) Avoid caffeine, exercise, or tobacco products for 30 minutes before checking. Empty your bladder. ?2) Sit with your back supported in a flat-backed chair. Rest your arm on something flat (arm of the chair, table, etc). ?3) Sit still with your feet flat on the floor, resting, for at least 5 minutes.  ?4) Check your blood pressure. Take 1-2 readings.  ?5) Write down these readings and bring with you to any provider appointments. ? ?Bring your home blood pressure machine with you to a provider's office for accuracy comparison at least once a year.  ? ?Make sure you take your blood pressure medications before you come to any office visit, even if you were asked to fast for labs. ? ?Let us know if you experience any concerns with symptoms of low blood sugars (dizziness, sweating, lightheadedness) or low blood pressures (dizziness or lightheadedness, especially upon standing).  ? ?Take care! ? ?Catie Jodi Mourning, PharmD ? ? ?

## 2021-12-20 NOTE — Chronic Care Management (AMB) (Signed)
? ?   ? ?Chief Complaint  ?Patient presents with  ? Hypertension  ? ? ?Tony Schmitt is a 51 y.o. year old male who was referred for medication management by their primary care provider, Charlott Rakes, MD. They presented for a telephone visit. ?  ?They were referred to the pharmacist by a quality report for assistance in managing hypertension.  ? ?Subjective: ? ?Care Team: ?Primary Care Provider: Charlott Rakes, MD ; Next Scheduled Visit: 01/31/22 ?Embedded pharmacist next scheduled visit: 01/05/22 ? ?Medication Access/Adherence ? ?Current Pharmacy:  ?[facility] [place] [facility] [place] [address]) [place] [address] ?Phone: [phone] Fax: [phone] ? ? ?Patient reports affordability concerns with their medications: No  ?Patient reports access/transportation concerns to their pharmacy: No  ?Patient reports adherence concerns with their medications:  No   ? ? ?Diabetes: ? ?Current medications: glipizide 5 mg twice daily ?Medications tried in the past:  ? ?Current glucose readings: reports 100-130s, denies hypoglycemia ? ?Hypertension: ?Current medications: amlodipine 10 mg daily, carvedilol 25 mg twice daily, hydralazine 100 mg three times daily ? ? ?Patient has a validated, automated, upper arm home BP cuff ?Current blood pressure readings readings: 100-120s/50-60s ? ?Patient denies hypotensive s/sx including dizziness, lightheadedness.  ? ?Hyperlipidemia/ASCVD Risk Reduction ? ?Current lipid lowering medications: atorvastatin 80 mg daily ? ?Antiplatelet regimen: aspirin 81 mg daily ? ? ?Health Maintenance ? ?Health Maintenance Due  ?Topic Date Due  ? OPHTHALMOLOGY EXAM  07/22/2020  ? COVID-19 Vaccine (4 - Booster for Moderna series) 09/06/2020  ? Zoster Vaccines- Shingrix (2 of 2) 04/25/2021  ?  ? ?Objective: ?Lab Results  ?Component Value Date  ? HGBA1C 6.8 10/25/2021  ? ? ?Lab Results  ?Component Value Date  ? CREATININE 4.04 (H) 12/08/2021  ? BUN 54 (H)  12/08/2021  ? NA 140 12/08/2021  ? K 3.8 12/08/2021  ? CL 111 12/08/2021  ? CO2 20 (L) 12/08/2021  ? ? ?Lab Results  ?Component Value Date  ? CHOL 699 (H) 04/16/2020  ? HDL 40 (L) 04/16/2020  ? LDLCALC 599 (H) 04/16/2020  ? TRIG 298 (H) 04/16/2020  ? CHOLHDL 17.5 04/16/2020  ? ? ?Medications Reviewed Today   ? ? Reviewed by Osker Mason, RPH-CPP (Pharmacist) on 12/20/21 at 1133  Med List Status: <None>  ? ?Medication Order Taking? Sig Documenting Provider Last Dose Status Informant  ?acetaminophen (TYLENOL) 325 MG tablet [id number]  Take 2 tablets (650 mg total) by mouth every 4 (four) hours as needed for mild pain (or temp > 37.5 C (99.5 F)). Cathlyn Parsons, PA-C  Active Mother  ?amLODipine (NORVASC) 10 MG tablet [id number] Yes Take 1 tablet (10 mg total) by mouth daily. Charlott Rakes, MD Taking Active   ?aspirin 81 MG chewable tablet [id number] Yes Chew 1 tablet (81 mg total) by mouth daily. Elmarie Shiley, MD Taking Active   ?atorvastatin (LIPITOR) 80 MG tablet [id number] Yes Take 1 tablet (80 mg total) by mouth at bedtime. Charlott Rakes, MD Taking Active   ?carvedilol (COREG) 25 MG tablet [id number] Yes Take 1 tablet (25 mg total) by mouth 2 (two) times daily with a meal. Charlott Rakes, MD Taking Active   ?cloNIDine (CATAPRES) 0.1 MG tablet [id number] Yes Take 0.1 mg by mouth at bedtime. [provider] Taking Active   ?furosemide (LASIX) 80 MG tablet [id number] Yes Take 1 tablet (80 mg total) by mouth daily. Charlott Rakes, MD Taking Active   ?glipiZIDE (GLUCOTROL) 5  MG tablet [id number] Yes Take 1 tablet (5 mg total) by mouth 2 (two) times daily before a meal. Charlott Rakes, MD Taking Active   ?hydrALAZINE (APRESOLINE) 100 MG tablet [id number] Yes Take 1 tablet (100 mg total) by mouth 3 (three) times daily. Charlott Rakes, MD Taking Active   ?isosorbide mononitrate (IMDUR) 30 MG 24 hr tablet [id number] Yes Take 1 tablet (30 mg total) by mouth daily. Charlott Rakes, MD Taking Active   ?   Discontinued 05/26/20 1018 (Change in therapy)   ?omeprazole (PRILOSEC) 40 MG capsule [id number] Yes Take 1 capsule by mouth once daily Charlott Rakes, MD Taking Active   ?sodium bicarbonate 650 MG tablet [id number] Yes Take 2 tablets by mouth twice daily Charlott Rakes, MD Taking Active   ?Vitamin D, Ergocalciferol, (DRISDOL) 1.25 MG (50000 UNIT) CAPS capsule [id number]  Take 1 capsule (50,000 Units total) by mouth every 7 (seven) days. Charlott Rakes, MD  Active   ? ?  ?  ? ?  ? ? ?Assessment/Plan:  ? ?Diabetes: ?- Currently controlled ?- Discussed risk of hypoglycemia with glipizide especially in the setting of renal dysfunction. Encouraged to continue to monitor for hypoglycemia ? ?Hypertension: ?- Currently controlled ?- Reviewed long term cardiovascular and renal outcomes of uncontrolled blood pressure ?- Reviewed appropriate blood pressure monitoring technique and reviewed goal blood pressure. Recommended to check home blood pressure and heart rate daily ?- Reviewed signs and symptoms of hypotension and encouraged to monitor for these ?- Recommend to continue current regimen at this time. Follow up with embedded pharmacist as scheduled ? ?Hyperlipidemia/ASCVD Risk Reduction: ?- Currently uncontrolled.  ?- Recommend updated lipid panel ?- Recommend to continue current regimen at this time ? ?Health Maintenance ?- Discussed CDC/ACIP recommendations for second Shingrix and COVID booster vaccine(s). Recommended to discuss Shingrix at next appointment in office, reminder placed in appointment notes, and to consider getting COVID booster at his pharmacy ? ?Follow Up Plan: embedded pharmacist in 2 weeks ? ?Catie Hedwig Morton, PharmD, BCACP ?[place] ?[phone] ? ? ? ?

## 2022-01-05 ENCOUNTER — Encounter: Payer: Self-pay | Admitting: Pharmacist

## 2022-01-05 ENCOUNTER — Ambulatory Visit: Payer: Medicaid Other | Attending: Family Medicine | Admitting: Pharmacist

## 2022-01-05 ENCOUNTER — Ambulatory Visit (HOSPITAL_COMMUNITY)
Admission: RE | Admit: 2022-01-05 | Discharge: 2022-01-05 | Disposition: A | Discharge status: Home / Self Care | Payer: Medicaid Other | Source: Ambulatory Visit | Attending: Nephrology | Admitting: Nephrology

## 2022-01-05 VITALS — BP 128/72

## 2022-01-05 VITALS — BP 128/72 | HR 78 | Temp 97.6°F | Resp 17

## 2022-01-05 DIAGNOSIS — I129 Hypertensive chronic kidney disease with stage 1 through stage 4 chronic kidney disease, or unspecified chronic kidney disease: Secondary | ICD-10-CM

## 2022-01-05 DIAGNOSIS — N183 Chronic kidney disease, stage 3 unspecified: Secondary | ICD-10-CM | POA: Diagnosis not present

## 2022-01-05 DIAGNOSIS — N184 Chronic kidney disease, stage 4 (severe): Secondary | ICD-10-CM | POA: Diagnosis not present

## 2022-01-05 DIAGNOSIS — E1122 Type 2 diabetes mellitus with diabetic chronic kidney disease: Secondary | ICD-10-CM | POA: Diagnosis not present

## 2022-01-05 LAB — CBC WITH DIFFERENTIAL/PLATELET
Abs Immature Granulocytes: 0.05 10*3/uL (ref 0.00–0.07)
Basophils Absolute: 0.1 10*3/uL (ref 0.0–0.1)
Basophils Relative: 1 %
Eosinophils Absolute: 0.2 10*3/uL (ref 0.0–0.5)
Eosinophils Relative: 2 %
HCT: 33.6 % — ABNORMAL LOW (ref 39.0–52.0)
Hemoglobin: 10.7 g/dL — ABNORMAL LOW (ref 13.0–17.0)
Immature Granulocytes: 1 %
Lymphocytes Relative: 27 %
Lymphs Abs: 2.7 10*3/uL (ref 0.7–4.0)
MCH: 26.8 pg (ref 26.0–34.0)
MCHC: 31.8 g/dL (ref 30.0–36.0)
MCV: 84.2 fL (ref 80.0–100.0)
Monocytes Absolute: 0.6 10*3/uL (ref 0.1–1.0)
Monocytes Relative: 6 %
Neutro Abs: 6.4 10*3/uL (ref 1.7–7.7)
Neutrophils Relative %: 63 %
Platelets: 337 10*3/uL (ref 150–400)
RBC: 3.99 MIL/uL — ABNORMAL LOW (ref 4.22–5.81)
RDW: 15.5 % (ref 11.5–15.5)
WBC: 10.1 10*3/uL (ref 4.0–10.5)
nRBC: 0 % (ref 0.0–0.2)

## 2022-01-05 LAB — RENAL FUNCTION PANEL
Albumin: 3.3 g/dL — ABNORMAL LOW (ref 3.5–5.0)
Anion gap: 9 (ref 5–15)
BUN: 73 mg/dL — ABNORMAL HIGH (ref 6–20)
CO2: 22 mmol/L (ref 22–32)
Calcium: 9.5 mg/dL (ref 8.9–10.3)
Chloride: 109 mmol/L (ref 98–111)
Creatinine, Ser: 4.62 mg/dL — ABNORMAL HIGH (ref 0.61–1.24)
GFR, Estimated: 15 mL/min — ABNORMAL LOW (ref >60)
Glucose, Bld: 122 mg/dL — ABNORMAL HIGH (ref 70–99)
Phosphorus: 3.8 mg/dL (ref 2.5–4.6)
Potassium: 4.5 mmol/L (ref 3.5–5.1)
Sodium: 140 mmol/L (ref 135–145)

## 2022-01-05 LAB — POCT HEMOGLOBIN-HEMACUE: Hemoglobin: 11 g/dL — ABNORMAL LOW (ref 13.0–17.0)

## 2022-01-05 MED ORDER — EPOETIN ALFA-EPBX 10000 UNIT/ML IJ SOLN
20000.0000 [IU] | Freq: Once | INTRAMUSCULAR | Status: AC
  Administered 2022-01-05: 20000 [IU] via SUBCUTANEOUS

## 2022-01-05 MED ORDER — EPOETIN ALFA-EPBX 10000 UNIT/ML IJ SOLN
INTRAMUSCULAR | Status: AC
  Filled 2022-01-05: qty 2

## 2022-01-05 NOTE — Progress Notes (Signed)
   S:    PCP: Dr. Margarita Rana  No chief complaint on file.  Tony Schmitt is a 51 y.o. male who presents for hypertension evaluation, education, and management. 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 Patient was referred and last seen by Primary Care Provider, Dr. Margarita Rana, on 10/25/2021. I saw him 12/05/2021 and BP was good.   Today, patient arrives in good spirits. Ambulates with assistance. Denies dizziness, headache, blurred vision, swelling.   Patient reports hypertension is longstanding.   Family/Social history:  Fhx: DM, HTN, HF  Tobacco: former smoker  Alcohol: none reported   Medication adherence reported. Took his medications this morning.   Current antihypertensives include: amlodipine 10 mg daily, carvedilol 25 mg BID, hydralazine 100 mg TID  Patient reported dietary habits:  -Compliant with with salt restriction -Drinks 1 cup of coffee per day   Patient-reported exercise habits: limited d/t impaired mobility  O:  Home BP readings: -His mom is present today and tells me his BP has been good at home. -Gives readings 120s-130s.  Vitals:   01/05/22 1515  BP: 128/72   Last 3 Office BP readings: BP Readings from Last 3 Encounters:  01/05/22 128/72  01/05/22 128/72  12/20/21 (!) 103/54   BMET    Component Value Date/Time   NA 140 01/05/2022 1401   NA 142 02/28/2021 1430   K 4.5 01/05/2022 1401   CL 109 01/05/2022 1401   CO2 22 01/05/2022 1401   GLUCOSE 122 (H) 01/05/2022 1401   BUN 73 (H) 01/05/2022 1401   BUN 41 (H) 02/28/2021 1430   CREATININE 4.62 (H) 01/05/2022 1401   CALCIUM 9.5 01/05/2022 1401   GFRNONAA 15 (L) 01/05/2022 1401   GFRAA 23 (L) 06/07/2020 1551    Renal function: CrCl cannot be calculated (Unknown ideal  weight.).  Clinical ASCVD: Yes  The ASCVD Risk score (Arnett DK, et al., 2019) failed to calculate for the following reasons:   The patient has a prior MI or stroke diagnosis  A/P: Hypertension diagnosed currently at goal on current medications. BP goal < 130/80 mmHg. Medication adherence appears appropriate and he took his medications before his visit today  -Continued current regimen.  -Adherence encouraged. Instructed pt to always take his antihypertensives before seeing a provider in clinic.  -F/u labs ordered - none -Counseled on lifestyle modifications for blood pressure control including reduced dietary sodium, increased exercise, adequate sleep. -Encouraged patient to check BP at home and bring log of readings to next visit. Counseled on proper use of home BP cuff.   Results reviewed and written information provided. Patient verbalized understanding of treatment plan. Total time in face-to-face counseling 30 minutes.   F/u PCP visit in 3-4 weeks.  Benard Halsted, PharmD, [facility] [place], [place] [phone]

## 2022-01-08 ENCOUNTER — Other Ambulatory Visit: Payer: Self-pay | Admitting: Family Medicine

## 2022-01-08 DIAGNOSIS — E1122 Type 2 diabetes mellitus with diabetic chronic kidney disease: Secondary | ICD-10-CM

## 2022-01-08 NOTE — Telephone Encounter (Signed)
Medication Refill - Medication: sodium bicarbonate 650 MG tablet   Has the patient contacted their pharmacy? Yes.   (Agent: If no, request that the patient contact the pharmacy for the refill. If patient does not wish to contact the pharmacy document the reason why and proceed with request.) (Agent: If yes, when and what did the pharmacy advise?)  Preferred Pharmacy (with phone number or street name):  [facility]), [address] Phone:  [phone]  Fax:  [phone]     Has the patient been seen for an appointment in the last year OR does the patient have an upcoming appointment? Yes.    Agent: Please be advised that RX refills may take up to 3 business days. We ask that you follow-up with your pharmacy.

## 2022-01-09 ENCOUNTER — Other Ambulatory Visit: Payer: Self-pay | Admitting: Family Medicine

## 2022-01-09 DIAGNOSIS — E1122 Type 2 diabetes mellitus with diabetic chronic kidney disease: Secondary | ICD-10-CM

## 2022-01-09 MED ORDER — SODIUM BICARBONATE 650 MG PO TABS: 1300.0000 mg | ORAL_TABLET | Freq: Two times a day (BID) | ORAL | 0 refills | Status: DC

## 2022-01-09 NOTE — Telephone Encounter (Signed)
Receipt confirmed by pharmacy 01/09/22  At 254 Requested Prescriptions  Pending Prescriptions Disp Refills  . sodium bicarbonate 650 MG tablet [Pharmacy Med Name: Sodium Bicarbonate 650 MG Oral Tablet] 120 tablet 0    Sig: Take 2 tablets by mouth twice daily     Endocrinology:  Minerals 2 Failed - 01/09/2022 11:33 AM      Failed - Mg Level in normal range and within 180 days    Magnesium  Date Value Ref Range Status  06/11/2020 1.5 (L) 1.7 - 2.4 mg/dL Final    Comment:    Performed at [facility], [place] [address], [place], [place] [address]         Failed - BMP within normal limits in the last 6 months    Glucose, Bld  Date Value Ref Range Status  01/05/2022 122 (H) 70 - 99 mg/dL Final    Comment:    Glucose reference range applies only to samples taken after fasting for at least 8 hours.   POC Glucose  Date Value Ref Range Status  10/25/2021 92 70 - 99 mg/dl Final   Glucose-Capillary  Date Value Ref Range Status  06/14/2020 136 (H) 70 - 99 mg/dL Final    Comment:    Glucose reference range applies only to samples taken after fasting for at least 8 hours.   Calcium  Date Value Ref Range Status  01/05/2022 9.5 8.9 - 10.3 mg/dL Final   Calcium, Ion  Date Value Ref Range Status  04/16/2020 1.30 1.15 - 1.40 mmol/L Final   Sodium  Date Value Ref Range Status  01/05/2022 140 135 - 145 mmol/L Final  02/28/2021 142 134 - 144 mmol/L Final   Potassium  Date Value Ref Range Status  01/05/2022 4.5 3.5 - 5.1 mmol/L Final   Chloride  Date Value Ref Range Status  01/05/2022 109 98 - 111 mmol/L Final   BUN  Date Value Ref Range Status  01/05/2022 73 (H) 6 - 20 mg/dL Final  02/28/2021 41 (H) 6 - 24 mg/dL Final   Creatinine, Ser  Date Value Ref Range Status  01/05/2022 4.62 (H) 0.61 - 1.24 mg/dL Final   Creatinine, Urine  Date Value Ref Range Status  06/11/2020 87.72 mg/dL Final    Comment:    Performed at [facility] [address], [place], [place]  [address]   CO2  Date Value Ref Range Status  01/05/2022 22 22 - 32 mmol/L Final   TCO2  Date Value Ref Range Status  04/16/2020 23 22 - 32 mmol/L Final   GFR calc Af Amer  Date Value Ref Range Status  06/07/2020 23 (L) >59 mL/min/1.73 Final    Comment:    **In accordance with recommendations from the NKF-ASN Task force,**   [facility] is in the process of updating its eGFR calculation to the   2021 CKD-EPI creatinine equation that estimates kidney function   without a race variable.    eGFR  Date Value Ref Range Status  02/28/2021 20 (L) >59 mL/min/1.73 Final   GFR, Estimated  Date Value Ref Range Status  01/05/2022 15 (L) >60 mL/min Final    Comment:    (NOTE) Calculated using the CKD-EPI Creatinine Equation (2021)          Passed - Valid encounter within last 12 months    Recent Outpatient Visits          4 days ago Hypertension in stage 4 chronic kidney disease due to  type 2 diabetes mellitus [facility])   [place], Jarome Matin, RPH-CPP   1 month ago Hypertension in stage 4 chronic kidney disease due to type 2 diabetes mellitus [facility])   [place], Jarome Matin, RPH-CPP   1 month ago Hypertension in stage 4 chronic kidney disease due to type 2 diabetes mellitus [facility])   [place], [name] L, RPH-CPP   2 months ago Type 2 diabetes mellitus with other specified complication, without long-term current use of insulin ([place])   [place], Charlane Ferretti, MD   7 months ago Type 2 diabetes mellitus with other specified complication, without long-term current use of insulin ([place])   [place], Enobong, MD      Future Appointments            In 3 weeks Charlott Rakes, MD [place]

## 2022-01-09 NOTE — Telephone Encounter (Signed)
Requested medication (s) are due for refill today: yes  Requested medication (s) are on the active medication list: yes  Last refill:  11/30/21 #120  Future visit scheduled: yes  Notes to clinic:  Last Mag level 2021   Requested Prescriptions  Pending Prescriptions Disp Refills   sodium bicarbonate 650 MG tablet 120 tablet 0    Sig: Take 2 tablets (1,300 mg total) by mouth 2 (two) times daily.     Endocrinology:  Minerals 2 Failed - 01/08/2022 11:35 AM      Failed - Mg Level in normal range and within 180 days    Magnesium  Date Value Ref Range Status  06/11/2020 1.5 (L) 1.7 - 2.4 mg/dL Final    Comment:    Performed at [facility], [place] [address] [place], [place] [address]         Failed - BMP within normal limits in the last 6 months    Glucose, Bld  Date Value Ref Range Status  01/05/2022 122 (H) 70 - 99 mg/dL Final    Comment:    Glucose reference range applies only to samples taken after fasting for at least 8 hours.   POC Glucose  Date Value Ref Range Status  10/25/2021 92 70 - 99 mg/dl Final   Glucose-Capillary  Date Value Ref Range Status  06/14/2020 136 (H) 70 - 99 mg/dL Final    Comment:    Glucose reference range applies only to samples taken after fasting for at least 8 hours.   Calcium  Date Value Ref Range Status  01/05/2022 9.5 8.9 - 10.3 mg/dL Final   Calcium, Ion  Date Value Ref Range Status  04/16/2020 1.30 1.15 - 1.40 mmol/L Final   Sodium  Date Value Ref Range Status  01/05/2022 140 135 - 145 mmol/L Final  02/28/2021 142 134 - 144 mmol/L Final   Potassium  Date Value Ref Range Status  01/05/2022 4.5 3.5 - 5.1 mmol/L Final   Chloride  Date Value Ref Range Status  01/05/2022 109 98 - 111 mmol/L Final   BUN  Date Value Ref Range Status  01/05/2022 73 (H) 6 - 20 mg/dL Final  02/28/2021 41 (H) 6 - 24 mg/dL Final   Creatinine, Ser  Date Value Ref Range Status  01/05/2022 4.62 (H) 0.61 - 1.24 mg/dL Final   Creatinine, Urine   Date Value Ref Range Status  06/11/2020 87.72 mg/dL Final    Comment:    Performed at [facility] [address], [place], [place] [address]   CO2  Date Value Ref Range Status  01/05/2022 22 22 - 32 mmol/L Final   TCO2  Date Value Ref Range Status  04/16/2020 23 22 - 32 mmol/L Final   GFR calc Af Amer  Date Value Ref Range Status  06/07/2020 23 (L) >59 mL/min/1.73 Final    Comment:    **In accordance with recommendations from the NKF-ASN Task force,**   [facility] is in the process of updating its eGFR calculation to the   2021 CKD-EPI creatinine equation that estimates kidney function   without a race variable.    eGFR  Date Value Ref Range Status  02/28/2021 20 (L) >59 mL/min/1.73 Final   GFR, Estimated  Date Value Ref Range Status  01/05/2022 15 (L) >60 mL/min Final    Comment:    (NOTE) Calculated using the CKD-EPI Creatinine Equation (2021)          Passed - Valid encounter within  last 12 months    Recent Outpatient Visits           4 days ago Hypertension in stage 4 chronic kidney disease due to type 2 diabetes mellitus [facility])   [place], Jarome Matin, RPH-CPP   1 month ago Hypertension in stage 4 chronic kidney disease due to type 2 diabetes mellitus [facility])   [place], Jarome Matin, RPH-CPP   1 month ago Hypertension in stage 4 chronic kidney disease due to type 2 diabetes mellitus [facility])   [place], [name] L, RPH-CPP   2 months ago Type 2 diabetes mellitus with other specified complication, without long-term current use of insulin ([place])   [place], Charlane Ferretti, MD   7 months ago Type 2 diabetes mellitus with other specified complication, without long-term current use of insulin ([place])   [place], Enobong, MD       Future Appointments              In 3 weeks Charlott Rakes, MD [place]

## 2022-01-31 ENCOUNTER — Encounter: Payer: Self-pay | Admitting: Family Medicine

## 2022-01-31 ENCOUNTER — Ambulatory Visit: Payer: Medicaid Other | Attending: Nephrology | Admitting: Family Medicine

## 2022-01-31 VITALS — BP 141/73 | HR 78 | Ht 65.0 in | Wt 159.8 lb

## 2022-01-31 DIAGNOSIS — E1169 Type 2 diabetes mellitus with other specified complication: Secondary | ICD-10-CM

## 2022-01-31 DIAGNOSIS — I129 Hypertensive chronic kidney disease with stage 1 through stage 4 chronic kidney disease, or unspecified chronic kidney disease: Secondary | ICD-10-CM

## 2022-01-31 DIAGNOSIS — K2100 Gastro-esophageal reflux disease with esophagitis, without bleeding: Secondary | ICD-10-CM

## 2022-01-31 DIAGNOSIS — N184 Chronic kidney disease, stage 4 (severe): Secondary | ICD-10-CM

## 2022-01-31 DIAGNOSIS — E1122 Type 2 diabetes mellitus with diabetic chronic kidney disease: Secondary | ICD-10-CM

## 2022-01-31 DIAGNOSIS — I69354 Hemiplegia and hemiparesis following cerebral infarction affecting left non-dominant side: Secondary | ICD-10-CM

## 2022-01-31 LAB — GLUCOSE, POCT (MANUAL RESULT ENTRY): POC Glucose: 132 mg/dl — AB (ref 70–99)

## 2022-01-31 LAB — POCT GLYCOSYLATED HEMOGLOBIN (HGB A1C): HbA1c, POC (controlled diabetic range): 6.5 % (ref 0.0–7.0)

## 2022-01-31 MED ORDER — CARVEDILOL 25 MG PO TABS: 25.0000 mg | ORAL_TABLET | Freq: Two times a day (BID) | ORAL | 1 refills | Status: DC

## 2022-01-31 MED ORDER — AMLODIPINE BESYLATE 10 MG PO TABS: 10.0000 mg | ORAL_TABLET | Freq: Every day | ORAL | 1 refills | Status: DC

## 2022-01-31 MED ORDER — ATORVASTATIN CALCIUM 80 MG PO TABS: 80.0000 mg | ORAL_TABLET | Freq: Every day | ORAL | 1 refills | Status: DC

## 2022-01-31 MED ORDER — OMEPRAZOLE 40 MG PO CPDR: 40.0000 mg | DELAYED_RELEASE_CAPSULE | Freq: Every day | ORAL | 1 refills | Status: DC

## 2022-01-31 MED ORDER — FUROSEMIDE 80 MG PO TABS: 80.0000 mg | ORAL_TABLET | Freq: Every day | ORAL | 1 refills | Status: DC

## 2022-01-31 MED ORDER — HYDRALAZINE HCL 100 MG PO TABS: 100.0000 mg | ORAL_TABLET | Freq: Three times a day (TID) | ORAL | 1 refills | Status: DC

## 2022-01-31 MED ORDER — GLIPIZIDE 5 MG PO TABS: 5.0000 mg | ORAL_TABLET | Freq: Two times a day (BID) | ORAL | 1 refills | Status: DC

## 2022-01-31 MED ORDER — ISOSORBIDE MONONITRATE ER 30 MG PO TB24: 30.0000 mg | ORAL_TABLET | Freq: Every day | ORAL | 1 refills | Status: DC

## 2022-01-31 NOTE — Progress Notes (Signed)
Subjective:  Patient ID: Tony Schmitt, male    DOB: 05/02/1971  Age: 51 y.o. MRN: [id number]  CC: Diabetes   HPI Tony Schmitt is a 51 y.o. 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 From a diabetes standpoint he has had no hypoglycemia, numbness in extremities or visual concerns. Also doing well on his statin and his antihypertensive He currently receives erythropoietin replacement therapy from his nephrologist.  Ambulates with the aid of a cane and has not had any recent falls.  He continues to experience stiffness in his left arm. Denies additional concerns today. Past Medical History:  Diagnosis Date   Chronic kidney disease    Diabetes mellitus without complication ([place])    Hypertension    Stroke [facility])     Past Surgical History:  Procedure Laterality Date   BUBBLE STUDY  04/18/2020   Procedure: BUBBLE STUDY;  Surgeon: Sueanne Margarita, MD;  Location: [place];  Service: Cardiovascular;;   TEE WITHOUT CARDIOVERSION N/A 04/18/2020   Procedure: TRANSESOPHAGEAL ECHOCARDIOGRAM (TEE);  Surgeon: Sueanne Margarita, MD;  Location: [facility] ENDOSCOPY;  Service: Cardiovascular;  Laterality: N/A;    Family History  Problem Relation Age of Onset   Diabetes Mother    Diabetes Father    Hypertension Father    Cancer Father    Heart failure Other     Social History   Socioeconomic History   Marital status: Single    Spouse name: Not on file   Number of children: Not on file   Years of education: Not on file   Highest education level: Not on file  Occupational History   Not on file  Tobacco Use   Smoking status: Former    Types: Cigarettes, Cigars   Smokeless  tobacco: Never  Vaping Use   Vaping Use: Never used  Substance and Sexual Activity   Alcohol use: Not Currently   Drug use: Never   Sexual activity: Not on file  Other Topics Concern   Not on file  Social History Narrative   Not on file   Social Determinants of Health   Financial Resource Strain: Not on file  Food Insecurity: Not on file  Transportation Needs: Not on file  Physical Activity: Not on file  Stress: Not on file  Social Connections: Not on file    Allergies  Allergen Reactions   Latex Itching    Outpatient Medications Prior to Visit  Medication Sig Dispense Refill   acetaminophen (TYLENOL) 325 MG tablet Take 2 tablets (650 mg total) by mouth every 4 (four) hours as needed for mild pain (or temp > 37.5 C (99.5 F)).     aspirin 81 MG chewable tablet Chew 1 tablet (81 mg total) by mouth daily. 30 tablet 0   cloNIDine (CATAPRES) 0.1 MG tablet Take 0.1 mg by mouth at bedtime.     sodium bicarbonate 650 MG tablet Take 2 tablets (1,300 mg total) by mouth 2 (two) times daily. 120 tablet 0   Vitamin D, Ergocalciferol, (DRISDOL) 1.25 MG (50000 UNIT) CAPS capsule Take 1 capsule (50,000 Units total) by mouth every 7 (seven) days. 4 capsule 2   amLODipine (NORVASC) 10 MG tablet Take 1 tablet (10 mg  total) by mouth daily. 90 tablet 1   atorvastatin (LIPITOR) 80 MG tablet Take 1 tablet (80 mg total) by mouth at bedtime. 90 tablet 1   carvedilol (COREG) 25 MG tablet Take 1 tablet (25 mg total) by mouth 2 (two) times daily with a meal. 180 tablet 1   furosemide (LASIX) 80 MG tablet Take 1 tablet (80 mg total) by mouth daily. 90 tablet 1   glipiZIDE (GLUCOTROL) 5 MG tablet Take 1 tablet (5 mg total) by mouth 2 (two) times daily before a meal. 180 tablet 1   hydrALAZINE (APRESOLINE) 100 MG tablet Take 1 tablet (100 mg total) by mouth 3 (three) times daily. 270 tablet 1   isosorbide mononitrate (IMDUR) 30 MG 24 hr tablet Take 1 tablet (30 mg total) by mouth daily. 90 tablet 1    omeprazole (PRILOSEC) 40 MG capsule Take 1 capsule by mouth once daily 90 capsule 0   No facility-administered medications prior to visit.     ROS Review of Systems  Constitutional:  Negative for activity change and appetite change.  HENT:  Negative for sinus pressure and sore throat.   Respiratory:  Negative for chest tightness, shortness of breath and wheezing.   Cardiovascular:  Negative for chest pain and palpitations.  Gastrointestinal:  Negative for abdominal distention, abdominal pain and constipation.  Genitourinary: Negative.   Musculoskeletal:  Positive for gait problem.       See HPI  Psychiatric/Behavioral:  Negative for behavioral problems and dysphoric mood.     Objective:  BP (!) 141/73   Pulse 78   Ht 5\' 5"  (1.651 m)   Wt 159 lb 12.8 oz (72.5 kg)   SpO2 98%   BMI 26.59 kg/m      01/31/2022    2:15 PM 01/05/2022    3:15 PM 01/05/2022    1:59 PM  BP/Weight  Systolic BP 350 093 818  Diastolic BP 73 72 72  Wt. (Lbs) 159.8    BMI 26.59 kg/m2        Physical Exam Constitutional:      Appearance: He is well-developed.  Cardiovascular:     Rate and Rhythm: Normal rate.     Heart sounds: Normal heart sounds. No murmur heard. Pulmonary:     Effort: Pulmonary effort is normal.     Breath sounds: Normal breath sounds. No wheezing or rales.  Chest:     Chest wall: No tenderness.  Abdominal:     General: Bowel sounds are normal. There is no distension.     Palpations: Abdomen is soft. There is no mass.     Tenderness: There is no abdominal tenderness.  Musculoskeletal:     Right lower leg: No edema.     Left lower leg: Edema (L dorsum) present.     Comments: L spastic hemiparesis  Neurological:     Mental Status: He is alert and oriented to person, place, and time.  Psychiatric:        Mood and Affect: Mood normal.        Latest Ref Rng & Units 01/05/2022    2:01 PM 12/08/2021    2:10 PM 10/27/2021    1:44 PM  CMP  Glucose 70 - 99 mg/dL 122  132  88    BUN 6 - 20 mg/dL 73  54  51   Creatinine 0.61 - 1.24 mg/dL 4.62  4.04  3.87   Sodium 135 - 145 mmol/L 140  140  139   Potassium 3.5 -  5.1 mmol/L 4.5  3.8  3.6   Chloride 98 - 111 mmol/L 109  111  108   CO2 22 - 32 mmol/L 22  20  23    Calcium 8.9 - 10.3 mg/dL 9.5  9.5  9.4     Lipid Panel     Component Value Date/Time   CHOL 699 (H) 04/16/2020 0558   CHOL 382 (H) 08/19/2019 1526   TRIG 298 (H) 04/16/2020 0558   HDL 40 (L) 04/16/2020 0558   HDL 116 08/19/2019 1526   CHOLHDL 17.5 04/16/2020 0558   VLDL 60 (H) 04/16/2020 0558   LDLCALC 599 (H) 04/16/2020 0558   LDLCALC 209 (H) 08/19/2019 1526    CBC    Component Value Date/Time   WBC 10.1 01/05/2022 1401   RBC 3.99 (L) 01/05/2022 1401   HGB 11.0 (L) 01/05/2022 1405   HGB 9.8 (L) 06/07/2020 1551   HCT 33.6 (L) 01/05/2022 1401   HCT 30.3 (L) 06/07/2020 1551   PLT 337 01/05/2022 1401   PLT 471 (H) 06/07/2020 1551   MCV 84.2 01/05/2022 1401   MCV 84 06/07/2020 1551   MCH 26.8 01/05/2022 1401   MCHC 31.8 01/05/2022 1401   RDW 15.5 01/05/2022 1401   RDW 14.1 06/07/2020 1551   LYMPHSABS 2.7 01/05/2022 1401   LYMPHSABS 1.8 06/07/2020 1551   MONOABS 0.6 01/05/2022 1401   EOSABS 0.2 01/05/2022 1401   EOSABS 0.2 06/07/2020 1551   BASOSABS 0.1 01/05/2022 1401   BASOSABS 0.1 06/07/2020 1551    Lab Results  Component Value Date   HGBA1C 6.5 01/31/2022    Assessment & Plan:  1. Type 2 diabetes mellitus with other specified complication, without long-term current use of insulin (HCC) Controlled with A1c of 6.5; goal is less than 7.0 Continue current regimen Counseled on Diabetic diet, my plate method, 540 minutes of moderate intensity exercise/week Blood sugar logs with fasting goals of 80-120 mg/dl, random of less than 180 and in the event of sugars less than 60 mg/dl or greater than 400 mg/dl encouraged to notify the clinic. Advised on the need for annual eye exams, annual foot exams, Pneumonia vaccine. - POCT glucose  (manual entry) - POCT glycosylated hemoglobin (Hb A1C) - atorvastatin (LIPITOR) 80 MG tablet; Take 1 tablet (80 mg total) by mouth at bedtime.  Dispense: 90 tablet; Refill: 1 - glipiZIDE (GLUCOTROL) 5 MG tablet; Take 1 tablet (5 mg total) by mouth 2 (two) times daily before a meal.  Dispense: 180 tablet; Refill: 1 - Microalbumin/Creatinine Ratio, Urine - Ambulatory referral to Physical Medicine Rehab - LP+Non-HDL Cholesterol; Future  2. 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 Discussed medication compliance, adverse effects. - carvedilol (COREG) 25 MG tablet; Take 1 tablet (25 mg total) by mouth 2 (two) times daily with a meal.  Dispense: 180 tablet; Refill: 1 - amLODipine (NORVASC) 10 MG tablet; Take 1 tablet (10 mg total) by mouth daily.  Dispense: 90 tablet; Refill: 1 - furosemide (LASIX) 80 MG tablet; Take 1 tablet (80 mg total) by mouth daily.  Dispense: 90 tablet; Refill: 1 - isosorbide mononitrate (IMDUR) 30 MG 24 hr tablet; Take 1 tablet (30 mg total) by mouth daily.  Dispense: 90 tablet; Refill: 1 - hydrALAZINE (APRESOLINE) 100 MG tablet; Take 1 tablet (  100 mg total) by mouth 3 (three) times daily.  Dispense: 270 tablet; Refill: 1  3. Gastroesophageal reflux disease with esophagitis without hemorrhage Reassess need for ongoing PPI use but he states his reflux symptoms return in the absence of PPI - omeprazole (PRILOSEC) 40 MG capsule; Take 1 capsule (40 mg total) by mouth daily.  Dispense: 90 capsule; Refill: 1  4. Hemiparesis affecting left side as late effect of cerebrovascular accident [facility]) He might benefit from Botox injections We will refer to physical medicine and rehab Fall precautions    Meds  ordered this encounter  Medications   atorvastatin (LIPITOR) 80 MG tablet    Sig: Take 1 tablet (80 mg total) by mouth at bedtime.    Dispense:  90 tablet    Refill:  1   carvedilol (COREG) 25 MG tablet    Sig: Take 1 tablet (25 mg total) by mouth 2 (two) times daily with a meal.    Dispense:  180 tablet    Refill:  1   amLODipine (NORVASC) 10 MG tablet    Sig: Take 1 tablet (10 mg total) by mouth daily.    Dispense:  90 tablet    Refill:  1   furosemide (LASIX) 80 MG tablet    Sig: Take 1 tablet (80 mg total) by mouth daily.    Dispense:  90 tablet    Refill:  1   glipiZIDE (GLUCOTROL) 5 MG tablet    Sig: Take 1 tablet (5 mg total) by mouth 2 (two) times daily before a meal.    Dispense:  180 tablet    Refill:  1   isosorbide mononitrate (IMDUR) 30 MG 24 hr tablet    Sig: Take 1 tablet (30 mg total) by mouth daily.    Dispense:  90 tablet    Refill:  1   hydrALAZINE (APRESOLINE) 100 MG tablet    Sig: Take 1 tablet (100 mg total) by mouth 3 (three) times daily.    Dispense:  270 tablet    Refill:  1   omeprazole (PRILOSEC) 40 MG capsule    Sig: Take 1 capsule (40 mg total) by mouth daily.    Dispense:  90 capsule    Refill:  1    Follow-up: Return in about 6 months (around 08/02/2022) for Chronic medical conditions.       Charlott Rakes, MD, FAAFP. [facility] [place], [place]   01/31/2022, 3:54 PM

## 2022-01-31 NOTE — Patient Instructions (Signed)
Hemiparesis  Hemiparesis is weakness on one side of the body, such as one arm and one leg. Hemiparesis often happens after a stroke. The weakness is usually on the side of the body that is opposite from the side of the brain that was affected by the stroke. For example, a stroke in the left side of the brain may cause hemiparesis on the right side of the body. What are the causes? This condition may be caused by: Stroke. Brain injury (trauma). Brain tumor. Multiple sclerosis. Infections or brain abscesses. Seizures. This is called postictal or Todd's paralysis. Migraine headaches. A type of dementia called mitochondrial encephalomyopathy, lactic acidosis, and stroke-like episodes ([facility]ELAS). What are the signs or symptoms? Symptoms of this condition include: Weakness, or loss of muscle strength, on one side of the body. This may affect the arm, leg, or face. Trouble doing daily activities, such as dressing or bathing. Loss of balance, problems with coordination, or both. Trouble walking. Problems grabbing things or holding them. Pusher syndrome. This means you push toward the weaker side of your body and often lose your balance. How is this diagnosed? This condition may be diagnosed based on: A physical exam. Your medical history. Imaging tests, such as a CT scan or MRI. How is this treated? Treatment for this condition includes: Physical therapy to help improve your strength, balance, and coordination. Occupational therapy to help you to do basic daily tasks, such as bathing and feeding yourself. Using devices to help you move around (assistive devices). These include canes, walkers, or wheelchairs. Exercises that are done in a pool (hydrotherapy). Medicines that help with stiffness and discomfort. Modified constraint-induced movement therapy (mCIMT). This therapy restricts one side of your body so that you have to use the weaker side. Electrical stimulation. This involves stimulating  the muscles on the weaker side of your body with small electrical pads. Experimental treatments like cortical stimulation therapy. This involves stimulating your brain with electrical currents to make it work better. Follow these instructions at home: Safety  Use assistive devices as instructed. Your risk of falling is higher because of hemiparesis. Make changes in your home to lower your risk of falls. These may include: Removing area rugs and mats from the floor. Getting rid of clutter. Adding rails or grab bars in bathrooms. Activity Do not drive unless your health care provider approves. Do not drive or use heavy machinery while taking medicine that may affect your alertness or reaction time. Return to your normal activities as told by your health care provider. Ask your health care provider what activities are safe for you. Ask your health care provider about getting extra help at home. You may have problems doing daily tasks, such as dressing, bathing, using the bathroom, and eating. General instructions Do not use any products that contain nicotine or tobacco. These products include cigarettes, chewing tobacco, and vaping devices, such as e-cigarettes. If you need help quitting, ask your health care provider. Take over-the-counter and prescription medicines only as told by your health care provider. Wear clothes and shoes that are easy to put on and take off. Try to avoid zippers and buttons. Avoid drinking alcohol. Keep all follow-up visits. This is important. Contact a health care provider if: Your symptoms get worse and medicines do not help. You have severe pain. You have repeated falls. Get help right away if: You have jerky movements that you cannot control (seizure). You have trouble breathing. You have chest pain. These symptoms may represent a serious problem   that is an emergency. Do not wait to see if the symptoms will go away. Get medical help right away. Call your  local emergency services (911 in the [place] Do not drive yourself to the hospital. Summary Hemiparesis is weakness on one side of the body, such as one arm and one leg. This often happens after a stroke or brain injury. Treatment usually includes physical and occupational therapy. Occupational therapy can help you with everyday tasks, such as dressing, using the bathroom, and eating. Your risk of falling is higher with this condition. You may need to use a wheelchair, walker, or other assistive device. Make changes in your home to prevent falls, such as removing area rugs and clutter, and installing grab bars in the bathroom. This information is not intended to replace advice given to you by your health care provider. Make sure you discuss any questions you have with your health care provider. Document Revised: 07/17/2020 Document Reviewed: 07/17/2020 Elsevier Patient Education  2023 Elsevier Inc.  

## 2022-02-01 LAB — MICROALBUMIN / CREATININE URINE RATIO
Creatinine, Urine: 87.9 mg/dL
Microalb/Creat Ratio: 3966 mg/g creat — ABNORMAL HIGH (ref 0–29)
Microalbumin, Urine: 3486.5 ug/mL

## 2022-02-02 ENCOUNTER — Ambulatory Visit (HOSPITAL_COMMUNITY)
Admission: RE | Admit: 2022-02-02 | Discharge: 2022-02-02 | Disposition: A | Discharge status: Home / Self Care | Payer: Medicaid Other | Source: Ambulatory Visit | Attending: Nephrology | Admitting: Nephrology

## 2022-02-02 VITALS — BP 140/78 | HR 79 | Resp 18

## 2022-02-02 DIAGNOSIS — D631 Anemia in chronic kidney disease: Secondary | ICD-10-CM | POA: Diagnosis present

## 2022-02-02 DIAGNOSIS — N184 Chronic kidney disease, stage 4 (severe): Secondary | ICD-10-CM | POA: Diagnosis not present

## 2022-02-02 DIAGNOSIS — N183 Chronic kidney disease, stage 3 unspecified: Secondary | ICD-10-CM

## 2022-02-02 LAB — POCT HEMOGLOBIN-HEMACUE: Hemoglobin: 10.5 g/dL — ABNORMAL LOW (ref 13.0–17.0)

## 2022-02-02 MED ORDER — EPOETIN ALFA-EPBX 10000 UNIT/ML IJ SOLN: 20000.0000 [IU] | Freq: Once | INTRAMUSCULAR | Status: DC

## 2022-02-02 MED ORDER — EPOETIN ALFA-EPBX 40000 UNIT/ML IJ SOLN
INTRAMUSCULAR | Status: AC
  Administered 2022-02-02: 20000 [IU] via SUBCUTANEOUS
  Filled 2022-02-02: qty 1

## 2022-02-05 ENCOUNTER — Other Ambulatory Visit: Payer: Self-pay | Admitting: Family Medicine

## 2022-02-05 DIAGNOSIS — E1122 Type 2 diabetes mellitus with diabetic chronic kidney disease: Secondary | ICD-10-CM

## 2022-02-07 ENCOUNTER — Ambulatory Visit: Payer: Medicaid Other | Attending: Family Medicine

## 2022-02-07 DIAGNOSIS — E1169 Type 2 diabetes mellitus with other specified complication: Secondary | ICD-10-CM

## 2022-02-08 ENCOUNTER — Ambulatory Visit: Payer: Self-pay

## 2022-02-08 LAB — LP+NON-HDL CHOLESTEROL
Cholesterol, Total: 227 mg/dL — ABNORMAL HIGH (ref 100–199)
HDL: 58 mg/dL (ref >39)
LDL Chol Calc (NIH): 141 mg/dL — ABNORMAL HIGH (ref 0–99)
Total Non-HDL-Chol (LDL+VLDL): 169 mg/dL — ABNORMAL HIGH (ref 0–129)
Triglycerides: 155 mg/dL — ABNORMAL HIGH (ref 0–149)
VLDL Cholesterol Cal: 28 mg/dL (ref 5–40)

## 2022-02-08 LAB — CMP14+EGFR
ALT: 23 IU/L (ref 0–44)
AST: 15 IU/L (ref 0–40)
Albumin/Globulin Ratio: 1.7 (ref 1.2–2.2)
Albumin: 4.3 g/dL (ref 3.8–4.9)
Alkaline Phosphatase: 125 IU/L — ABNORMAL HIGH (ref 44–121)
BUN/Creatinine Ratio: 11 (ref 9–20)
BUN: 47 mg/dL — ABNORMAL HIGH (ref 6–24)
Bilirubin Total: 0.3 mg/dL (ref 0.0–1.2)
CO2: 19 mmol/L — ABNORMAL LOW (ref 20–29)
Calcium: 10.7 mg/dL — ABNORMAL HIGH (ref 8.7–10.2)
Chloride: 105 mmol/L (ref 96–106)
Creatinine, Ser: 4.19 mg/dL — ABNORMAL HIGH (ref 0.76–1.27)
Globulin, Total: 2.6 g/dL (ref 1.5–4.5)
Glucose: 126 mg/dL — ABNORMAL HIGH (ref 70–99)
Potassium: 4.1 mmol/L (ref 3.5–5.2)
Sodium: 140 mmol/L (ref 134–144)
Total Protein: 6.9 g/dL (ref 6.0–8.5)
eGFR: 16 mL/min/{1.73_m2} — ABNORMAL LOW (ref >59)

## 2022-02-09 ENCOUNTER — Other Ambulatory Visit: Payer: Self-pay | Admitting: Pharmacist

## 2022-02-09 ENCOUNTER — Other Ambulatory Visit: Payer: Self-pay | Admitting: Family Medicine

## 2022-02-09 ENCOUNTER — Ambulatory Visit: Payer: Self-pay

## 2022-02-09 DIAGNOSIS — I10 Essential (primary) hypertension: Secondary | ICD-10-CM

## 2022-02-09 MED ORDER — ROSUVASTATIN CALCIUM 10 MG PO TABS: 10.0000 mg | ORAL_TABLET | Freq: Every day | ORAL | 1 refills | Status: DC

## 2022-02-09 MED ORDER — ROSUVASTATIN CALCIUM 20 MG PO TABS: 20.0000 mg | ORAL_TABLET | Freq: Every day | ORAL | 1 refills | Status: DC

## 2022-02-09 NOTE — Chronic Care Management (AMB) (Unsigned)
Chief Complaint  Patient presents with   Hypertension    Tony Schmitt is a 51 y.o. year old male who presented for a telephone visit.   They were referred to the pharmacist by a quality report for assistance in managing hypertension.   Patient is participating in a Managed Medicaid Plan:  {MM YES/NO:27447::"Yes"}  Subjective:  Care Team: Primary Care Provider: Charlott Rakes, MD ; Next Scheduled Visit: *** {careteamprovider:27366}  Medication Access/Adherence  Current Pharmacy:  [facility] [address] ([address]), [place] [address] [place]) [place] [address] Phone: [phone] Fax: [phone]   Patient reports affordability concerns with their medications: {YES/NO:21197} Patient reports access/transportation concerns to their pharmacy: {YES/NO:21197} Patient reports adherence concerns with their medications:  {YES/NO:21197} ***   Hypertension:  Current medications: *** Medications previously tried:   Patient has a validated, automated, upper arm home BP cuff Current blood pressure readings readings: 100-150s/70-80s  Patient {Actions; denies-reports:120008} hypotensive s/sx including ***dizziness, lightheadedness.  Patient {Actions; denies-reports:120008} hypertensive symptoms including ***headache, chest pain, shortness of breath  Current meal patterns: ***  Current physical activity: ***   Health Maintenance  Health Maintenance Due  Topic Date Due   OPHTHALMOLOGY EXAM  07/22/2020   COVID-19 Vaccine (4 - Booster for Moderna series) 09/06/2020   Zoster Vaccines- Shingrix (2 of 2) 04/25/2021     Objective: Lab Results  Component Value Date   HGBA1C 6.5 01/31/2022    Lab Results  Component Value Date   CREATININE 4.19 (H) 02/07/2022   BUN 47 (H) 02/07/2022   NA 140 02/07/2022   K 4.1 02/07/2022   CL 105 02/07/2022   CO2 19 (L) 02/07/2022    Lab Results  Component Value Date   CHOL 227 (H) 02/07/2022   HDL  58 02/07/2022   LDLCALC 141 (H) 02/07/2022   TRIG 155 (H) 02/07/2022   CHOLHDL 17.5 04/16/2020    Medications Reviewed Today     Reviewed by Leonarda Salon, RN (Registered Nurse) on 02/02/22 at 97  Med List Status: <None>   Medication Order Taking? Sig Documenting Provider Last Dose Status Informant  acetaminophen (TYLENOL) 325 MG tablet [id number]  Take 2 tablets (650 mg total) by mouth every 4 (four) hours as needed for mild pain (or temp > 37.5 C (99.5 F)). Cathlyn Parsons, PA-C  Active Mother  amLODipine (NORVASC) 10 MG tablet [id number]  Take 1 tablet (10 mg total) by mouth daily. Charlott Rakes, MD  Active   aspirin 81 MG chewable tablet [id number]  Chew 1 tablet (81 mg total) by mouth daily. Regalado, Belkys A, MD  Active   atorvastatin (LIPITOR) 80 MG tablet [id number]  Take 1 tablet (80 mg total) by mouth at bedtime. Charlott Rakes, MD  Active   carvedilol (COREG) 25 MG tablet [id number]  Take 1 tablet (25 mg total) by mouth 2 (two) times daily with a meal. Charlott Rakes, MD  Active   cloNIDine (CATAPRES) 0.1 MG tablet [id number]  Take 0.1 mg by mouth at bedtime. [provider]  Active   furosemide (LASIX) 80 MG tablet [id number]  Take 1 tablet (80 mg total) by mouth daily. Charlott Rakes, MD  Active   glipiZIDE (GLUCOTROL) 5 MG tablet [id number]  Take 1 tablet (5 mg total) by mouth 2 (two) times daily before a meal. Charlott Rakes, MD  Active   hydrALAZINE (APRESOLINE) 100 MG tablet [id number]  Take 1 tablet (100 mg total) by mouth 3 (three) times daily.  Charlott Rakes, MD  Active   isosorbide mononitrate (IMDUR) 30 MG 24 hr tablet [id number]  Take 1 tablet (30 mg total) by mouth daily. Charlott Rakes, MD  Active   omeprazole (PRILOSEC) 40 MG capsule [id number]  Take 1 capsule (40 mg total) by mouth daily. Charlott Rakes, MD  Active   sodium bicarbonate 650 MG tablet [id number]  Take 2 tablets (1,300 mg total) by mouth 2 (two) times daily. Charlott Rakes, MD  Active    Vitamin D, Ergocalciferol, (DRISDOL) 1.25 MG (50000 UNIT) CAPS capsule [id number]  Take 1 capsule (50,000 Units total) by mouth every 7 (seven) days. Charlott Rakes, MD  Active               Assessment/Plan:   {Pharmacy A/P Choices:26421}  Follow Up Plan: ***  ***

## 2022-02-10 NOTE — Patient Instructions (Signed)
It was great talking with you today!  Check your blood pressure twice daily, and any time you have concerning symptoms like headache, chest pain, dizziness, shortness of breath, or vision changes.   Our goal is less than 130/80.  To appropriately check your blood pressure, make sure you do the following:  1) Avoid caffeine, exercise, or tobacco products for 30 minutes before checking. Empty your bladder. 2) Sit with your back supported in a flat-backed chair. Rest your arm on something flat (arm of the chair, table, etc). 3) Sit still with your feet flat on the floor, resting, for at least 5 minutes.  4) Check your blood pressure. Take 1-2 readings.  5) Write down these readings and bring with you to any provider appointments.  Bring your home blood pressure machine with you to a provider's office for accuracy comparison at least once a year.   Make sure you take your blood pressure medications before you come to any office visit, even if you were asked to fast for labs.  Take care!  Catie Hedwig Morton, PharmD, [facility] [phone]

## 2022-02-12 DIAGNOSIS — E1122 Type 2 diabetes mellitus with diabetic chronic kidney disease: Secondary | ICD-10-CM | POA: Diagnosis not present

## 2022-02-12 DIAGNOSIS — I129 Hypertensive chronic kidney disease with stage 1 through stage 4 chronic kidney disease, or unspecified chronic kidney disease: Secondary | ICD-10-CM | POA: Diagnosis not present

## 2022-02-12 DIAGNOSIS — N2581 Secondary hyperparathyroidism of renal origin: Secondary | ICD-10-CM | POA: Diagnosis not present

## 2022-02-12 DIAGNOSIS — N184 Chronic kidney disease, stage 4 (severe): Secondary | ICD-10-CM | POA: Diagnosis not present

## 2022-02-12 DIAGNOSIS — D631 Anemia in chronic kidney disease: Secondary | ICD-10-CM | POA: Diagnosis not present

## 2022-02-12 DIAGNOSIS — I639 Cerebral infarction, unspecified: Secondary | ICD-10-CM | POA: Diagnosis not present

## 2022-03-02 ENCOUNTER — Ambulatory Visit (HOSPITAL_COMMUNITY)
Admission: RE | Admit: 2022-03-02 | Discharge: 2022-03-02 | Disposition: A | Discharge status: Home / Self Care | Payer: Medicaid Other | Source: Ambulatory Visit | Attending: Nephrology | Admitting: Nephrology

## 2022-03-02 ENCOUNTER — Encounter (HOSPITAL_COMMUNITY): Payer: Medicaid Other

## 2022-03-02 VITALS — BP 147/78 | HR 75 | Temp 97.3°F | Resp 16

## 2022-03-02 DIAGNOSIS — N183 Chronic kidney disease, stage 3 unspecified: Secondary | ICD-10-CM | POA: Diagnosis present

## 2022-03-02 LAB — CBC WITH DIFFERENTIAL/PLATELET
Abs Immature Granulocytes: 0.05 10*3/uL (ref 0.00–0.07)
Basophils Absolute: 0.1 10*3/uL (ref 0.0–0.1)
Basophils Relative: 1 %
Eosinophils Absolute: 0.3 10*3/uL (ref 0.0–0.5)
Eosinophils Relative: 2 %
HCT: 31.7 % — ABNORMAL LOW (ref 39.0–52.0)
Hemoglobin: 10.2 g/dL — ABNORMAL LOW (ref 13.0–17.0)
Immature Granulocytes: 1 %
Lymphocytes Relative: 24 %
Lymphs Abs: 2.5 10*3/uL (ref 0.7–4.0)
MCH: 27.1 pg (ref 26.0–34.0)
MCHC: 32.2 g/dL (ref 30.0–36.0)
MCV: 84.1 fL (ref 80.0–100.0)
Monocytes Absolute: 0.6 10*3/uL (ref 0.1–1.0)
Monocytes Relative: 6 %
Neutro Abs: 6.9 10*3/uL (ref 1.7–7.7)
Neutrophils Relative %: 66 %
Platelets: 320 10*3/uL (ref 150–400)
RBC: 3.77 MIL/uL — ABNORMAL LOW (ref 4.22–5.81)
RDW: 15.5 % (ref 11.5–15.5)
WBC: 10.5 10*3/uL (ref 4.0–10.5)
nRBC: 0 % (ref 0.0–0.2)

## 2022-03-02 LAB — RENAL FUNCTION PANEL
Albumin: 3.3 g/dL — ABNORMAL LOW (ref 3.5–5.0)
Anion gap: 7 (ref 5–15)
BUN: 54 mg/dL — ABNORMAL HIGH (ref 6–20)
CO2: 22 mmol/L (ref 22–32)
Calcium: 9.7 mg/dL (ref 8.9–10.3)
Chloride: 111 mmol/L (ref 98–111)
Creatinine, Ser: 4.78 mg/dL — ABNORMAL HIGH (ref 0.61–1.24)
GFR, Estimated: 14 mL/min — ABNORMAL LOW
Glucose, Bld: 124 mg/dL — ABNORMAL HIGH (ref 70–99)
Phosphorus: 3.4 mg/dL (ref 2.5–4.6)
Potassium: 3.9 mmol/L (ref 3.5–5.1)
Sodium: 140 mmol/L (ref 135–145)

## 2022-03-02 LAB — FERRITIN: Ferritin: 501 ng/mL — ABNORMAL HIGH (ref 24–336)

## 2022-03-02 LAB — IRON AND TIBC
Iron: 66 ug/dL (ref 45–182)
Saturation Ratios: 27 % (ref 17.9–39.5)
TIBC: 241 ug/dL — ABNORMAL LOW (ref 250–450)
UIBC: 175 ug/dL

## 2022-03-02 LAB — POCT HEMOGLOBIN-HEMACUE: Hemoglobin: 10.7 g/dL — ABNORMAL LOW (ref 13.0–17.0)

## 2022-03-02 MED ORDER — EPOETIN ALFA 20000 UNIT/ML IJ SOLN
20000.0000 [IU] | Freq: Once | INTRAMUSCULAR | Status: AC
  Administered 2022-03-02: 20000 [IU] via SUBCUTANEOUS

## 2022-03-02 MED ORDER — EPOETIN ALFA-EPBX 40000 UNIT/ML IJ SOLN: 20000.0000 [IU] | Freq: Once | INTRAMUSCULAR | Status: DC

## 2022-03-02 MED ORDER — EPOETIN ALFA 20000 UNIT/ML IJ SOLN
INTRAMUSCULAR | Status: AC
  Filled 2022-03-02: qty 1

## 2022-03-07 ENCOUNTER — Other Ambulatory Visit: Payer: Self-pay | Admitting: Family Medicine

## 2022-03-07 DIAGNOSIS — E1122 Type 2 diabetes mellitus with diabetic chronic kidney disease: Secondary | ICD-10-CM

## 2022-03-09 ENCOUNTER — Encounter (HOSPITAL_COMMUNITY): Payer: Medicaid Other

## 2022-03-14 ENCOUNTER — Encounter: Payer: Self-pay | Admitting: Physical Medicine & Rehabilitation

## 2022-03-19 ENCOUNTER — Encounter: Payer: Self-pay | Admitting: Podiatry

## 2022-03-19 ENCOUNTER — Ambulatory Visit (INDEPENDENT_AMBULATORY_CARE_PROVIDER_SITE_OTHER): Payer: Medicaid Other | Admitting: Podiatry

## 2022-03-19 DIAGNOSIS — E1165 Type 2 diabetes mellitus with hyperglycemia: Secondary | ICD-10-CM | POA: Diagnosis not present

## 2022-03-19 DIAGNOSIS — N183 Chronic kidney disease, stage 3 unspecified: Secondary | ICD-10-CM

## 2022-03-19 DIAGNOSIS — B351 Tinea unguium: Secondary | ICD-10-CM

## 2022-03-19 NOTE — Progress Notes (Signed)
This patient returns to my office for at risk foot care.  This patient requires this care by a professional since this patient will be at risk due to having CKD and diabetes and CVA..  This patient is unable to cut nails himself since the patient cannot reach his nails.These nails are painful walking and wearing shoes.  This patient presents for at risk foot care today.  General Appearance  Alert, conversant and in no acute stress.  Vascular  Dorsalis pedis and posterior tibial  pulses are palpable  bilaterally.  Capillary return is within normal limits  bilaterally. Temperature is within normal limits  bilaterally.  Neurologic  Senn-Weinstein monofilament wire test within normal limits  bilaterally. Muscle power within normal limits bilaterally.  Nails Thick disfigured discolored nails with subungual debris  from hallux to fifth toes bilaterally. No evidence of bacterial infection or drainage bilaterally.  Orthopedic  No limitations of motion  feet .  No crepitus or effusions noted.  No bony pathology or digital deformities noted.  Skin  dry scaly skin with no porokeratosis noted bilaterally.  No signs of infections or ulcers noted.     Onychomycosis  Pain in right toes  Pain in left toes  Consent was obtained for treatment procedures.   Mechanical debridement of nails 1-5  bilaterally performed with a nail nipper.  Filed with dremel without incident.    Return office visit   3 months                   Told patient to return for periodic foot care and evaluation due to potential at risk complications.   Sheli Dorin DPM   

## 2022-03-30 ENCOUNTER — Ambulatory Visit (HOSPITAL_COMMUNITY)
Admission: RE | Admit: 2022-03-30 | Discharge: 2022-03-30 | Disposition: A | Discharge status: Home / Self Care | Payer: Medicaid Other | Source: Ambulatory Visit | Attending: Nephrology | Admitting: Nephrology

## 2022-03-30 VITALS — BP 126/70 | HR 75 | Temp 97.3°F | Resp 16

## 2022-03-30 DIAGNOSIS — N183 Chronic kidney disease, stage 3 unspecified: Secondary | ICD-10-CM | POA: Diagnosis not present

## 2022-03-30 LAB — CBC WITH DIFFERENTIAL/PLATELET
Abs Immature Granulocytes: 0.04 10*3/uL (ref 0.00–0.07)
Basophils Absolute: 0.1 10*3/uL (ref 0.0–0.1)
Basophils Relative: 1 %
Eosinophils Absolute: 0.2 10*3/uL (ref 0.0–0.5)
Eosinophils Relative: 2 %
HCT: 34.5 % — ABNORMAL LOW (ref 39.0–52.0)
Hemoglobin: 11.2 g/dL — ABNORMAL LOW (ref 13.0–17.0)
Immature Granulocytes: 0 %
Lymphocytes Relative: 24 %
Lymphs Abs: 2.4 10*3/uL (ref 0.7–4.0)
MCH: 27.1 pg (ref 26.0–34.0)
MCHC: 32.5 g/dL (ref 30.0–36.0)
MCV: 83.5 fL (ref 80.0–100.0)
Monocytes Absolute: 0.6 10*3/uL (ref 0.1–1.0)
Monocytes Relative: 6 %
Neutro Abs: 6.4 10*3/uL (ref 1.7–7.7)
Neutrophils Relative %: 67 %
Platelets: 275 10*3/uL (ref 150–400)
RBC: 4.13 MIL/uL — ABNORMAL LOW (ref 4.22–5.81)
RDW: 15.3 % (ref 11.5–15.5)
WBC: 9.7 10*3/uL (ref 4.0–10.5)
nRBC: 0 % (ref 0.0–0.2)

## 2022-03-30 LAB — RENAL FUNCTION PANEL
Albumin: 3.3 g/dL — ABNORMAL LOW (ref 3.5–5.0)
Anion gap: 7 (ref 5–15)
BUN: 61 mg/dL — ABNORMAL HIGH (ref 6–20)
CO2: 24 mmol/L (ref 22–32)
Calcium: 9.7 mg/dL (ref 8.9–10.3)
Chloride: 107 mmol/L (ref 98–111)
Creatinine, Ser: 5.01 mg/dL — ABNORMAL HIGH (ref 0.61–1.24)
GFR, Estimated: 13 mL/min — ABNORMAL LOW (ref >60)
Glucose, Bld: 155 mg/dL — ABNORMAL HIGH (ref 70–99)
Phosphorus: 3.9 mg/dL (ref 2.5–4.6)
Potassium: 4.4 mmol/L (ref 3.5–5.1)
Sodium: 138 mmol/L (ref 135–145)

## 2022-03-30 MED ORDER — EPOETIN ALFA-EPBX 40000 UNIT/ML IJ SOLN: 20000.0000 [IU] | Freq: Once | INTRAMUSCULAR | Status: DC

## 2022-03-30 MED ORDER — EPOETIN ALFA 20000 UNIT/ML IJ SOLN
INTRAMUSCULAR | Status: AC
  Administered 2022-03-30: 20000 [IU]
  Filled 2022-03-30: qty 1

## 2022-04-27 ENCOUNTER — Ambulatory Visit (HOSPITAL_COMMUNITY)
Admission: RE | Admit: 2022-04-27 | Discharge: 2022-04-27 | Disposition: A | Discharge status: Home / Self Care | Payer: Medicaid Other | Source: Ambulatory Visit | Attending: Nephrology | Admitting: Nephrology

## 2022-04-27 VITALS — BP 141/73 | HR 73

## 2022-04-27 DIAGNOSIS — N183 Chronic kidney disease, stage 3 unspecified: Secondary | ICD-10-CM | POA: Diagnosis present

## 2022-04-27 LAB — RENAL FUNCTION PANEL
Albumin: 3.4 g/dL — ABNORMAL LOW (ref 3.5–5.0)
Anion gap: 8 (ref 5–15)
BUN: 49 mg/dL — ABNORMAL HIGH (ref 6–20)
CO2: 22 mmol/L (ref 22–32)
Calcium: 9.5 mg/dL (ref 8.9–10.3)
Chloride: 109 mmol/L (ref 98–111)
Creatinine, Ser: 4.63 mg/dL — ABNORMAL HIGH (ref 0.61–1.24)
GFR, Estimated: 14 mL/min — ABNORMAL LOW (ref >60)
Glucose, Bld: 173 mg/dL — ABNORMAL HIGH (ref 70–99)
Phosphorus: 3.2 mg/dL (ref 2.5–4.6)
Potassium: 3.5 mmol/L (ref 3.5–5.1)
Sodium: 139 mmol/L (ref 135–145)

## 2022-04-27 LAB — IRON AND TIBC
Iron: 41 ug/dL — ABNORMAL LOW (ref 45–182)
Saturation Ratios: 20 % (ref 17.9–39.5)
TIBC: 210 ug/dL — ABNORMAL LOW (ref 250–450)
UIBC: 169 ug/dL

## 2022-04-27 LAB — CBC WITH DIFFERENTIAL/PLATELET
Abs Immature Granulocytes: 0.03 10*3/uL (ref 0.00–0.07)
Basophils Absolute: 0.1 10*3/uL (ref 0.0–0.1)
Basophils Relative: 1 %
Eosinophils Absolute: 0.2 10*3/uL (ref 0.0–0.5)
Eosinophils Relative: 3 %
HCT: 34.6 % — ABNORMAL LOW (ref 39.0–52.0)
Hemoglobin: 11.3 g/dL — ABNORMAL LOW (ref 13.0–17.0)
Immature Granulocytes: 0 %
Lymphocytes Relative: 17 %
Lymphs Abs: 1.5 10*3/uL (ref 0.7–4.0)
MCH: 27.2 pg (ref 26.0–34.0)
MCHC: 32.7 g/dL (ref 30.0–36.0)
MCV: 83.4 fL (ref 80.0–100.0)
Monocytes Absolute: 0.7 10*3/uL (ref 0.1–1.0)
Monocytes Relative: 8 %
Neutro Abs: 6.2 10*3/uL (ref 1.7–7.7)
Neutrophils Relative %: 71 %
Platelets: 276 10*3/uL (ref 150–400)
RBC: 4.15 MIL/uL — ABNORMAL LOW (ref 4.22–5.81)
RDW: 15.2 % (ref 11.5–15.5)
WBC: 8.7 10*3/uL (ref 4.0–10.5)
nRBC: 0 % (ref 0.0–0.2)

## 2022-04-27 LAB — FERRITIN: Ferritin: 415 ng/mL — ABNORMAL HIGH (ref 24–336)

## 2022-04-27 LAB — POCT HEMOGLOBIN-HEMACUE: Hemoglobin: 11.9 g/dL — ABNORMAL LOW (ref 13.0–17.0)

## 2022-04-27 MED ORDER — EPOETIN ALFA-EPBX 10000 UNIT/ML IJ SOLN
INTRAMUSCULAR | Status: AC
  Filled 2022-04-27: qty 2

## 2022-04-27 MED ORDER — EPOETIN ALFA-EPBX 10000 UNIT/ML IJ SOLN
20000.0000 [IU] | Freq: Once | INTRAMUSCULAR | Status: AC
  Administered 2022-04-27: 20000 [IU] via SUBCUTANEOUS

## 2022-05-01 ENCOUNTER — Encounter: Payer: Medicaid Other | Attending: Physical Medicine & Rehabilitation | Admitting: Physical Medicine & Rehabilitation

## 2022-05-01 ENCOUNTER — Encounter: Payer: Self-pay | Admitting: Physical Medicine & Rehabilitation

## 2022-05-01 VITALS — BP 119/72 | HR 71 | Ht 65.0 in | Wt 160.2 lb

## 2022-05-01 DIAGNOSIS — M21372 Foot drop, left foot: Secondary | ICD-10-CM | POA: Insufficient documentation

## 2022-05-01 DIAGNOSIS — N185 Chronic kidney disease, stage 5: Secondary | ICD-10-CM | POA: Diagnosis not present

## 2022-05-01 DIAGNOSIS — I639 Cerebral infarction, unspecified: Secondary | ICD-10-CM | POA: Diagnosis not present

## 2022-05-01 DIAGNOSIS — G8110 Spastic hemiplegia affecting unspecified side: Secondary | ICD-10-CM | POA: Insufficient documentation

## 2022-05-01 DIAGNOSIS — I120 Hypertensive chronic kidney disease with stage 5 chronic kidney disease or end stage renal disease: Secondary | ICD-10-CM | POA: Diagnosis not present

## 2022-05-01 DIAGNOSIS — E1122 Type 2 diabetes mellitus with diabetic chronic kidney disease: Secondary | ICD-10-CM | POA: Diagnosis not present

## 2022-05-01 DIAGNOSIS — N2581 Secondary hyperparathyroidism of renal origin: Secondary | ICD-10-CM | POA: Diagnosis not present

## 2022-05-01 DIAGNOSIS — D631 Anemia in chronic kidney disease: Secondary | ICD-10-CM | POA: Diagnosis not present

## 2022-05-01 NOTE — Progress Notes (Signed)
Subjective:    Patient ID: Tony Schmitt, male    DOB: Aug 08, 1970, 51 y.o.   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  He completed inpatient rehabilitation as well as outpatient rehabilitation.  He did see Dr. Posey Pronto at this clinic for follow-up in 2021.  He has been following with his primary care physician. Worked with [employer] clinic, PT. Patient and mother are wondering if there is any other treatments that may be beneficial.  The patient is independent with all self-care and mobility.  He has a walking tolerance of 20 minutes.  He climbs steps he does not drive.  He denies any falls.  He does not have a brace for his ankle.  Review of notes from Dr. Posey Pronto indicate that he was having spasticity in the left upper extremity, Botox was contemplated no baclofen was recommended due to renal function, listed as CKD 3 but GFR is 16 so must be at minimum CKD 4 Pain Inventory Average Pain 3 Pain Right Now 3 My pain is dull and tingling  In the last 24 hours, has pain interfered with the following? General activity 3 Relation with others 3 Enjoyment of life 8 What TIME of day is your pain at its worst? night Sleep (in general) Poor  Pain is worse with: walking and inactivity Pain improves with: pacing activities Relief from Meds: 1  walk without assistance walk with assistance use a cane how many minutes can you walk? 20 ability to climb steps?  yes do you drive?  no  not employed: date last employed . disabled: date disabled .  numbness tingling trouble walking spasms  Any changes since last visit?  no  Any changes since last visit?  no    Family History  Problem Relation Age of Onset   Diabetes Mother    Diabetes Father    Hypertension Father    Cancer Father    Heart failure Other    Social History   Socioeconomic History   Marital status: Single    Spouse name: Not on file   Number of children: Not on file    Years of education: Not on file   Highest education level: Not on file  Occupational History   Not on file  Tobacco Use   Smoking status: Former    Types: Cigarettes, Cigars   Smokeless tobacco: Never  Vaping Use   Vaping Use: Never used  Substance and Sexual Activity   Alcohol use: Not Currently   Drug use: Never   Sexual activity: Not on file  Other Topics Concern   Not on file  Social History Narrative   Not on file   Social Determinants of Health   Financial Resource Strain: Not on file  Food Insecurity: Not on file  Transportation Needs: Not on file  Physical Activity: Not on file  Stress: Not on file  Social Connections: Not on file   Past Surgical History:  Procedure Laterality Date   BUBBLE STUDY  04/18/2020   Procedure: BUBBLE STUDY;  Surgeon: Sueanne Margarita, MD;  Location: [place];  Service: Cardiovascular;;   TEE WITHOUT CARDIOVERSION N/A 04/18/2020   Procedure: TRANSESOPHAGEAL ECHOCARDIOGRAM (TEE);  Surgeon: Sueanne Margarita, MD;  Location: [facility] ENDOSCOPY;  Service: Cardiovascular;  Laterality: N/A;   Past Medical History:  Diagnosis Date   Chronic kidney disease    Diabetes mellitus without complication ([place])    Hypertension  Stroke (HCC)    BP 119/72   Pulse 71   Ht 5\' 5"  (1.651 m)   Wt 160 lb 3.2 oz (72.7 kg)   SpO2 97%   BMI 26.66 kg/m   Opioid Risk Score:   Fall Risk Score:  `1  Depression screen [facility] 2/9     05/01/2022   11:31 AM 01/31/2022    2:22 PM 05/31/2021    2:14 PM 02/28/2021    1:54 PM 11/28/2020    1:46 PM 06/07/2020    3:20 PM 05/26/2020   10:49 AM  Depression screen PHQ 2/9  Decreased Interest 0 0 1 1 2  0 2  Down, Depressed, Hopeless 0 0 0 1 0 0 0  PHQ - 2 Score 0 0 1 2 2  0 2  Altered sleeping 3 1 2 1 2 1  0  Tired, decreased energy 2 1 1 1 2 1 3   Change in appetite 0 0 0 1 0 0 1  Feeling bad or failure about yourself  0 0 0 1 0 0 0  Trouble concentrating 0 0 0 1 0 0 0  Moving slowly or fidgety/restless 0 0 0 1 0 0 1   Suicidal thoughts 0 0 0 1 0 0 0  PHQ-9 Score 5 2 4 9 6 2 7      Review of Systems  Constitutional: Negative.   HENT: Negative.    Eyes: Negative.   Respiratory: Negative.    Cardiovascular: Negative.   Gastrointestinal: Negative.   Endocrine: Negative.   Genitourinary: Negative.   Musculoskeletal:  Positive for gait problem.  Skin: Negative.   Allergic/Immunologic: Negative.   Neurological:  Positive for weakness.  Hematological: Negative.   Psychiatric/Behavioral: Negative.    All other systems reviewed and are negative.      Objective:   Physical Exam HENT:     Head: Normocephalic and atraumatic.  Eyes:     Extraocular Movements: Extraocular movements intact.     Pupils: Pupils are equal, round, and reactive to light.  Pulmonary:     Effort: Pulmonary effort is normal.  Musculoskeletal:     Right lower leg: No edema.     Left lower leg: No edema.  Skin:    General: Skin is warm and dry.  Neurological:     Mental Status: He is alert and oriented to person, place, and time.  Psychiatric:        Mood and Affect: Affect is blunt.        Speech: Speech normal.        Behavior: Behavior is withdrawn.   Motor strength is 3 - at the left deltoid to minus at the bicep and tricep 3 - finger flexors 0 finger extensors Left lower extremity 4 - hip flexors and knee extensors 3/5 ankle plantar flexors trace ankle dorsiflexors Sensation intact to light touch in the left upper and left lower limb Tone MAS 3 at the finger flexors and 2 at the elbow flexors on the left side Left lower extremity tone is normal Ambulates without assistive device he does have hip hiking to clear left foot he does have a foot drop but is able to clear it with compensatory strategies.        Assessment & Plan:   1.  Left spastic hemiplegia we discussed prognosis for further improvement which is limited at this point 2 years post stroke.  We discussed that for general mobility and health he may  benefit from aquatic physical therapy We  also discussed from a safety standpoint as well as to improve energy expenditure a trial of AFO left lower extremity off the shelf Discussing spasticity he may benefit from Botox injection 50 units to the left FDS and 50 units left FDP patient will think about this he was given literature as well. I will see the patient back in 3 months but if he decides to have the injections he can call back sooner.

## 2022-05-03 IMAGING — US US RENAL
1 series · 14 of 25 positions shown · non-contrast
Comparison: None.

CLINICAL DATA: Acute kidney injury

EXAM:
RENAL / URINARY TRACT ULTRASOUND COMPLETE

[Series 1: us renal · 14 of 29 slices shown]
[im 1/29]
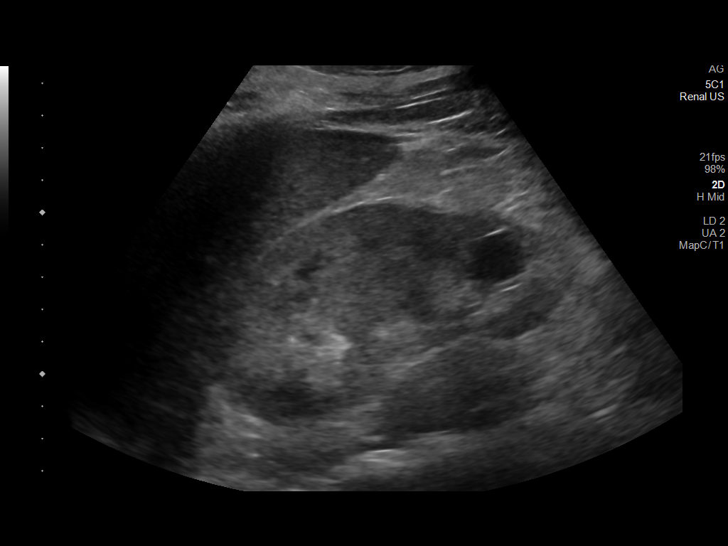
[im 3/29]
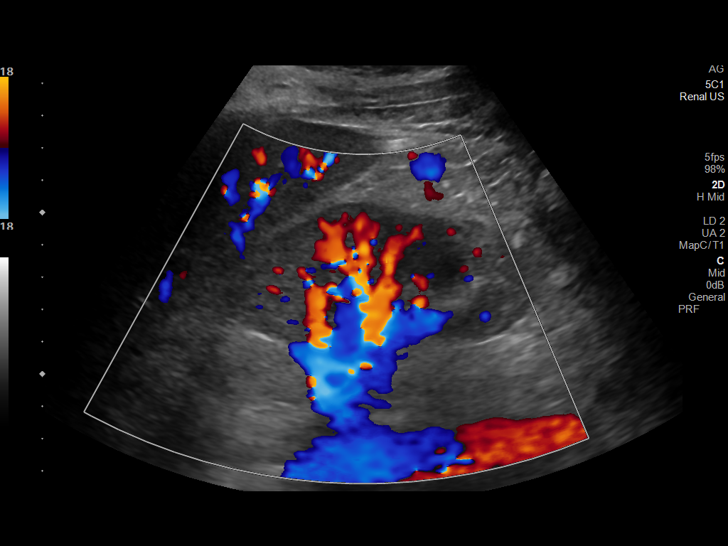
[im 5/29]
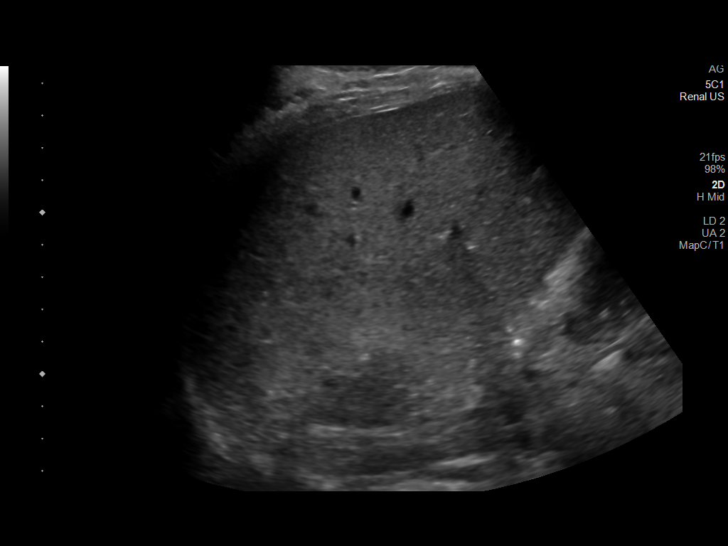
[im 8/29]
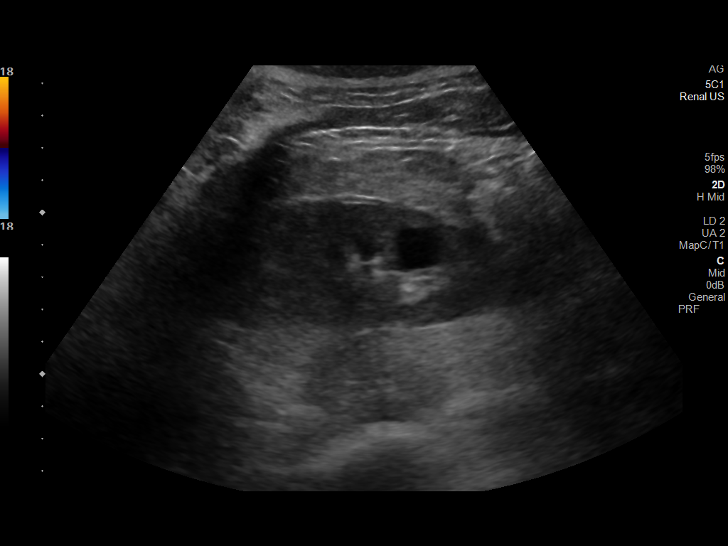
[im 10/29]
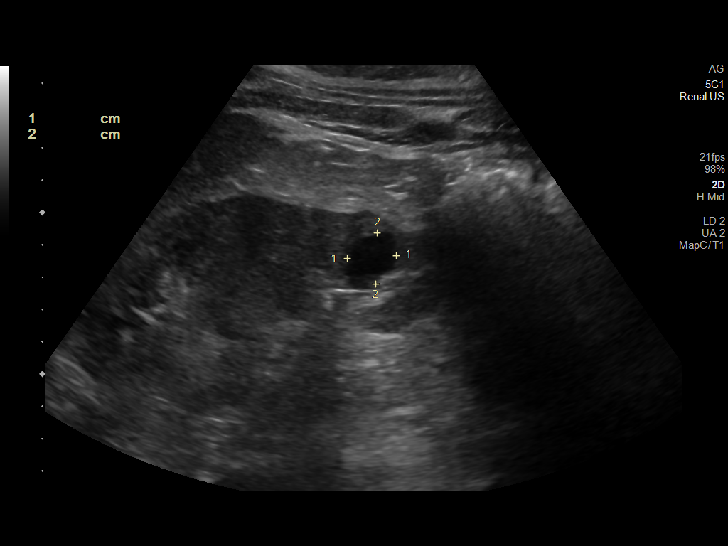
[im 11/29]
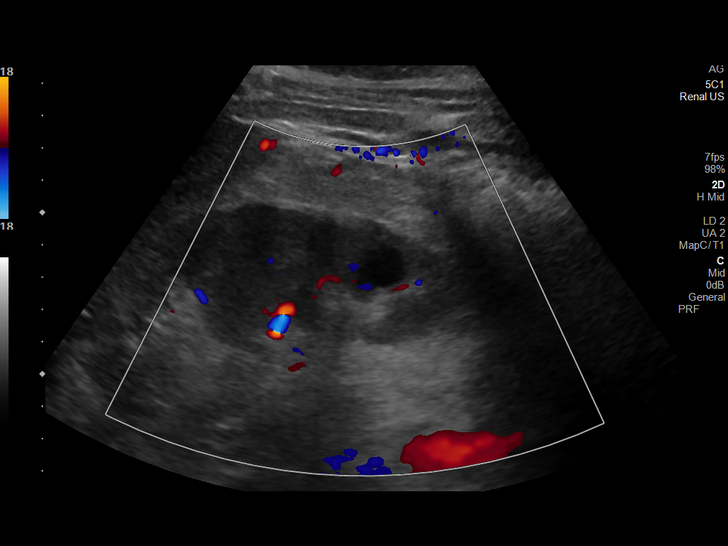
[im 13/29]
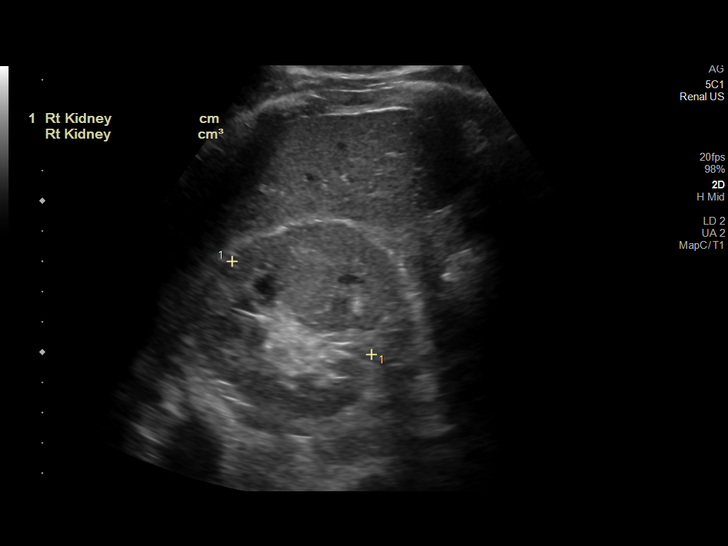
[im 16/29]
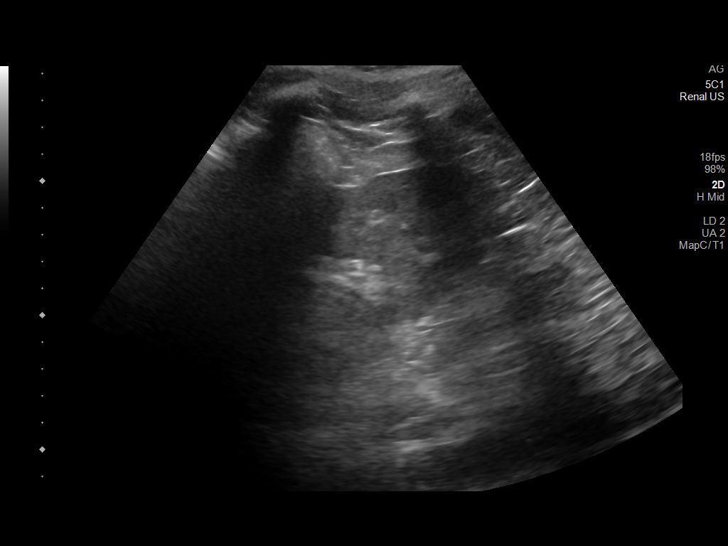
[im 18/29]
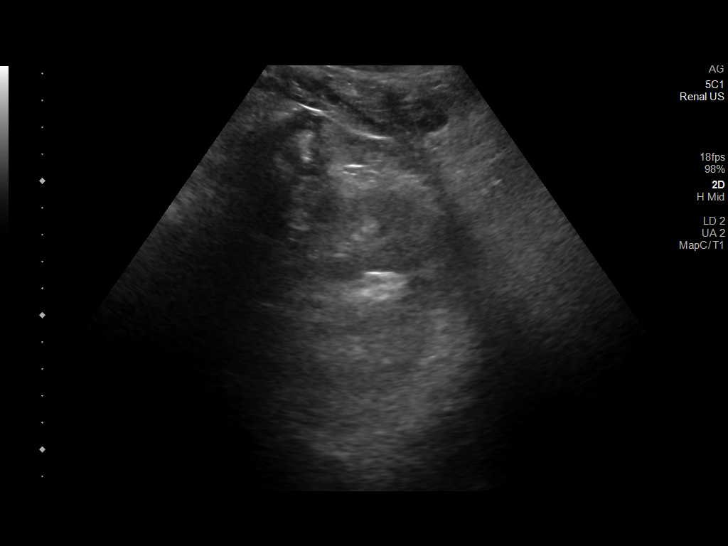
[im 19/29]
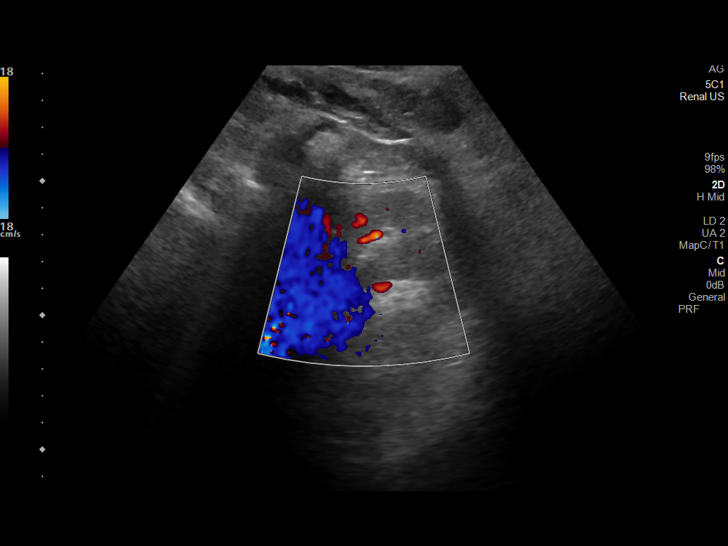
[im 22/29]
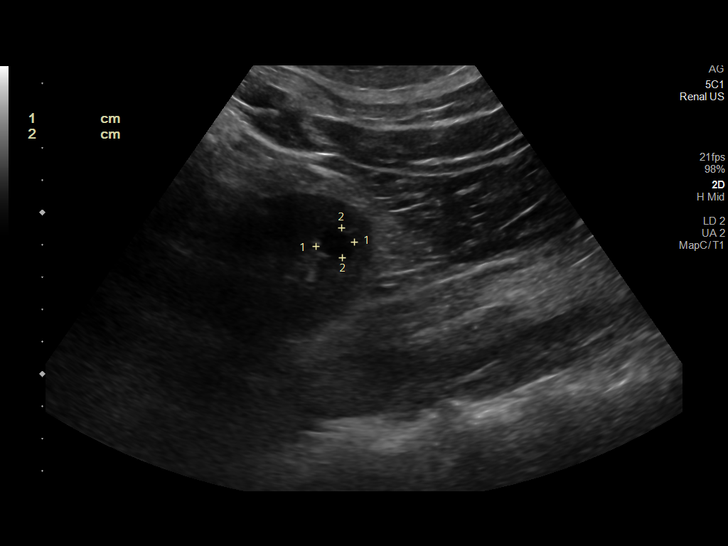
[im 24/29]
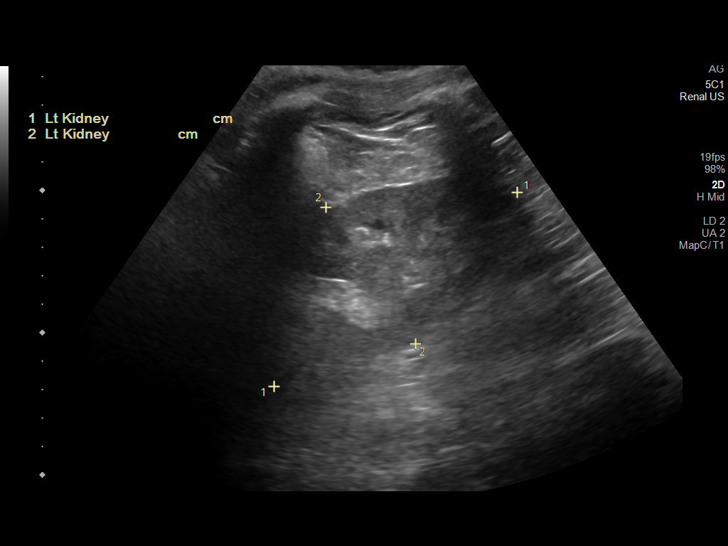
[im 26/29]
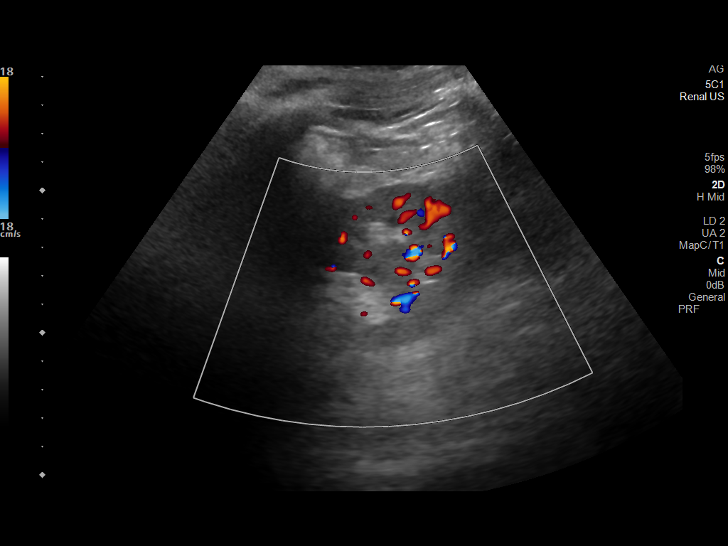
[im 29/29]
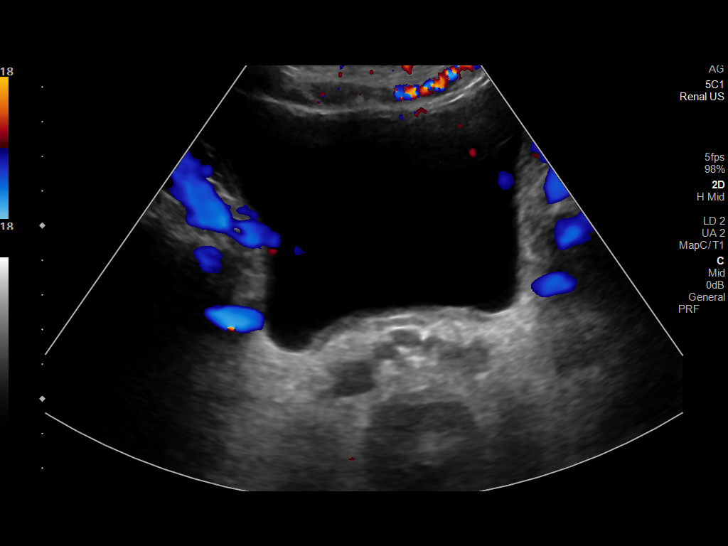

[14 of 25 positions shown; findings below may reference images not displayed]

FINDINGS: Right Kidney:

Renal measurements: 11.6 x 5 x 5.5 cm = volume: 169 mL. There is no
hydronephrosis. There is a 1.6 cm cyst arising from the lower pole.

Left Kidney:

Renal measurements: 10.9 x 5.7 x 4.1 cm = volume: Is 134 mL. There
is a 1.3 cm cyst arising from the lower pole.

Bladder:

Appears normal for degree of bladder distention.

Other:

None.
IMPRESSION: Unremarkable exam.  No hydronephrosis.

## 2022-05-10 ENCOUNTER — Ambulatory Visit (INDEPENDENT_AMBULATORY_CARE_PROVIDER_SITE_OTHER): Payer: Medicaid Other | Admitting: Vascular Surgery

## 2022-05-10 ENCOUNTER — Encounter: Payer: Self-pay | Admitting: Vascular Surgery

## 2022-05-10 VITALS — BP 165/73 | HR 77 | Temp 98.3°F | Resp 20 | Ht 65.0 in | Wt 165.0 lb

## 2022-05-10 DIAGNOSIS — N185 Chronic kidney disease, stage 5: Secondary | ICD-10-CM | POA: Diagnosis not present

## 2022-05-10 NOTE — Progress Notes (Signed)
ASSESSMENT & PLAN   STAGE V CHRONIC KIDNEY DISEASE: This patient has stage V chronic kidney disease.  We were asked to evaluate him for a fistula.  If we could not place a fistula we were asked to not place an AV graft.  He has a contracture in the left arm and cannot rotate his arm out laterally for dialysis.  Therefore he would have to have access in the right arm.  His most recent vein map was over a year ago.  I did look at his veins in the right arm myself with the [employer] and could not find an adequate vein for a fistula.  Therefore we will wait before placing an AV graft.  I have had a long discussion with the patient and his mother about our options of AV fistula versus AV graft.  We have discussed the indications for surgery and potential complications.  If his kidney function progresses then certainly we can schedule him for placement of an AV graft.  REASON FOR CONSULT:    To evaluate for hemodialysis access.  The consult is requested by Dr. Marval Regal.   HPI:   Tony Schmitt is a 51 y.o. male who was referred to evaluate for hemodialysis access.  I have reviewed the records from the referring office.  We were asked to place an AV fistula if possible.  If an AV fistula were not possible we were to wait before placing an AV graft.  The patient has stage V chronic kidney disease.  This is secondary to diabetes and hypertension.  My history, the patient has stage V chronic kidney disease secondary to diabetes and hypertension.  He denies any recent uremic symptoms.  He has had a previous stroke associated with left-sided weakness predominantly in the upper extremity.  He does not have a catheter.  He has not had a pacemaker.  He is not on any blood thinners.  Past Medical History:  Diagnosis Date   Chronic kidney disease    Diabetes mellitus without complication ([place])    Hypertension    Stroke ([place])     Family History  Problem Relation Age of Onset   Diabetes Mother     Diabetes Father    Hypertension Father    Cancer Father    Heart failure Other     SOCIAL HISTORY: Social History   Tobacco Use   Smoking status: Former    Types: Cigarettes, Cigars   Smokeless tobacco: Never  Substance Use Topics   Alcohol use: Not Currently    Allergies  Allergen Reactions   Latex Itching    Current Outpatient Medications  Medication Sig Dispense Refill   acetaminophen (TYLENOL) 325 MG tablet Take 2 tablets (650 mg total) by mouth every 4 (four) hours as needed for mild pain (or temp > 37.5 C (99.5 F)).     amLODipine (NORVASC) 10 MG tablet Take 1 tablet (10 mg total) by mouth daily. 90 tablet 1   aspirin 81 MG chewable tablet Chew 1 tablet (81 mg total) by mouth daily. 30 tablet 0   atorvastatin (LIPITOR) 80 MG tablet Take 80 mg by mouth daily.     carvedilol (COREG) 25 MG tablet Take 1 tablet (25 mg total) by mouth 2 (two) times daily with a meal. 180 tablet 1   cloNIDine (CATAPRES) 0.1 MG tablet Take 0.1 mg by mouth at bedtime.     furosemide (LASIX) 80 MG tablet Take 1 tablet (80 mg total) by mouth daily.  90 tablet 1   glipiZIDE (GLUCOTROL) 5 MG tablet Take 1 tablet (5 mg total) by mouth 2 (two) times daily before a meal. 180 tablet 1   hydrALAZINE (APRESOLINE) 100 MG tablet Take 1 tablet (100 mg total) by mouth 3 (three) times daily. 270 tablet 1   isosorbide mononitrate (IMDUR) 30 MG 24 hr tablet Take 1 tablet (30 mg total) by mouth daily. 90 tablet 1   omeprazole (PRILOSEC) 40 MG capsule Take 1 capsule (40 mg total) by mouth daily. 90 capsule 1   rosuvastatin (CRESTOR) 10 MG tablet Take 1 tablet (10 mg total) by mouth daily. 90 tablet 1   sodium bicarbonate 650 MG tablet Take 2 tablets by mouth twice daily 120 tablet 2   No current facility-administered medications for this visit.    REVIEW OF SYSTEMS:  [X]  denotes positive finding, [ ]  denotes negative finding Cardiac  Comments:  Chest pain or chest pressure:    Shortness of breath upon  exertion:    Short of breath when lying flat:    Irregular heart rhythm:        Vascular    Pain in calf, thigh, or hip brought on by ambulation:    Pain in feet at night that wakes you up from your sleep:     Blood clot in your veins:    Leg swelling:         Pulmonary    Oxygen at home:    Productive cough:     Wheezing:         Neurologic    Sudden weakness in arms or legs:     Sudden numbness in arms or legs:     Sudden onset of difficulty speaking or slurred speech:    Temporary loss of vision in one eye:     Problems with dizziness:         Gastrointestinal    Blood in stool:     Vomited blood:         Genitourinary    Burning when urinating:     Blood in urine:        Psychiatric    Major depression:         Hematologic    Bleeding problems:    Problems with blood clotting too easily:        Skin    Rashes or ulcers:        Constitutional    Fever or chills:    -  PHYSICAL EXAM:   Vitals:   05/10/22 1555  BP: (!) 165/73  Pulse: 77  Resp: 20  Temp: 98.3 F (36.8 C)  SpO2: 96%  Weight: 165 lb (74.8 kg)  Height: 5\' 5"  (1.651 m)   Body mass index is 27.46 kg/m. GENERAL: The patient is a well-nourished male, in no acute distress. The vital signs are documented above. CARDIAC: There is a regular rate and rhythm.  VASCULAR: I do not detect carotid bruits. He has palpable radial pulses. I looked at his right forearm cephalic vein right upper arm cephalic vein, and right basilic vein and none of these veins appear to be adequate for a fistula. PULMONARY: There is good air exchange bilaterally without wheezing or rales. MUSCULOSKELETAL: There are no major deformities. NEUROLOGIC: He has left upper extremity weakness and is unable to rotate his left arm out or abducted significantly.  Therefore I do not think he can have access in the left arm. SKIN: There are no ulcers or  rashes noted. PSYCHIATRIC: The patient has a normal affect.  DATA:    LABS: I  have reviewed the labs from the nephrologist office.  The last GFR I see was 13.  The patient has not had a recent vein map.  The most recent study I can find was from May 2022.  At that time, on the right side the forearm cephalic vein looked small.  The upper arm cephalic vein looked small.  The basilic vein looked small.  On the left side, the forearm and upper arm cephalic vein looked small.  The basilic vein looked small.  [name] [facility]

## 2022-05-15 ENCOUNTER — Ambulatory Visit: Payer: Medicaid Other | Attending: Physical Medicine & Rehabilitation

## 2022-05-15 ENCOUNTER — Other Ambulatory Visit: Payer: Self-pay

## 2022-05-15 DIAGNOSIS — R2681 Unsteadiness on feet: Secondary | ICD-10-CM | POA: Insufficient documentation

## 2022-05-15 DIAGNOSIS — M6281 Muscle weakness (generalized): Secondary | ICD-10-CM | POA: Diagnosis present

## 2022-05-15 DIAGNOSIS — M21372 Foot drop, left foot: Secondary | ICD-10-CM | POA: Diagnosis not present

## 2022-05-15 DIAGNOSIS — R262 Difficulty in walking, not elsewhere classified: Secondary | ICD-10-CM | POA: Insufficient documentation

## 2022-05-15 DIAGNOSIS — G8110 Spastic hemiplegia affecting unspecified side: Secondary | ICD-10-CM | POA: Diagnosis not present

## 2022-05-15 NOTE — Therapy (Signed)
OUTPATIENT PHYSICAL THERAPY NEURO EVALUATION   Patient Name: Tony Schmitt MRN: [id number] DOB:[birth year], 51 y.o., male Today's Date: 05/15/2022   PCP: Charlott Rakes, MD REFERRING PROVIDER: Charlett Blake, MD    PT End of Session - 05/15/22 1014     Visit Number 1    Number of Visits 8    Date for PT Re-Evaluation 07/10/22    Authorization Type [place] Medicaid UHC    Authorization Time Period 27 VL    Authorization - Visit Number 1    Authorization - Number of Visits 27    PT Start Time 7616    PT Stop Time 1100    PT Time Calculation (min) 45 min    Activity Tolerance Patient tolerated treatment well    Behavior During Therapy Flat affect             Past Medical History:  Diagnosis Date   Chronic kidney disease    Diabetes mellitus without complication ([place])    Hypertension    Stroke [facility])    Past Surgical History:  Procedure Laterality Date   BUBBLE STUDY  04/18/2020   Procedure: BUBBLE STUDY;  Surgeon: Sueanne Margarita, MD;  Location: [place];  Service: Cardiovascular;;   TEE WITHOUT CARDIOVERSION N/A 04/18/2020   Procedure: TRANSESOPHAGEAL ECHOCARDIOGRAM (TEE);  Surgeon: Sueanne Margarita, MD;  Location: [facility] ENDOSCOPY;  Service: Cardiovascular;  Laterality: N/A;   Patient Active Problem List   Diagnosis Date Noted   Nephrotic syndrome    Peripheral edema    Abnormality of gait 05/23/2020   Type 2 diabetes mellitus with hyperglycemia, without long-term current use of insulin (HCC)    Thrombocytosis    Benign essential HTN    Hypoalbuminemia due to protein-calorie malnutrition (HCC)    Acute blood loss anemia    Leukocytosis    Slow transit constipation    AKI (acute kidney injury) ([place])    Diabetes mellitus type 2 with complications, uncontrolled    CVA (cerebral vascular accident) ([place]) 04/22/2020   Acute left-sided weakness    Tobacco abuse    Essential hypertension    Acute CVA (cerebrovascular accident) ([place]) 04/16/2020   Hypertensive  urgency 04/16/2020   Type 2 diabetes mellitus with vascular disease ([place]) 04/16/2020   CKD (chronic kidney disease) stage 3, GFR 30-59 ml/min ([place]) 04/23/2019   Hyperlipidemia 04/18/2019   Dyslipidemia associated with type 2 diabetes mellitus ([place]) 08/29/2018   Hypertension associated with type 2 diabetes mellitus ([place]) 08/29/2018    ONSET DATE: 04/16/20  REFERRING DIAG: W73.710 (ICD-10-CM) - Acquired left foot drop G81.10 (ICD-10-CM) - Spastic hemiplegia, unspecified etiology, unspecified laterality   THERAPY DIAG:  Difficulty in walking, not elsewhere classified  Muscle weakness (generalized)  Unsteadiness on feet  Rationale for Evaluation and Treatment Rehabilitation  SUBJECTIVE:  SUBJECTIVE STATEMENT: "Nothing much going on."  Pt had CVA on 04/16/20 resulting in left hemiparesis/plegia. Pt presents today with inquiry for bracing for left ankle and expressed goal of regaining function in left hand/UE  Pt accompanied by: family member (mother)  PERTINENT HISTORY: CKD, DM, HTN, stroke  PAIN:  Are you having pain? No  PRECAUTIONS: None  WEIGHT BEARING RESTRICTIONS No  FALLS: Has patient fallen in last 6 months? No  LIVING ENVIRONMENT: Lives with: lives with their family Lives in: House/apartment, one-level Stairs: Yes: External: 2 steps; none Has following equipment at home: Single point cane and hurrycane  PLOF: Independent with basic ADLs, Independent with household mobility with device, and uses manual wheelchair for community distances (someone pushes)  PATIENT GOALS be able to hold stuff in left hand  OBJECTIVE:   DIAGNOSTIC FINDINGS: imaging studies at time of CVA  COGNITION: Overall cognitive status: Within functional limits for tasks assessed   SENSATION: Light touch:  Impaired  LUE/LLE  COORDINATION: Grossly impaired LUE/LLE  EDEMA:  Minimal in LE, Left hand   MUSCLE TONE: LLE: Flaccid   MUSCLE LENGTH: Left achilles tightness. Lacking 5-10 degrees for plantigrade. With seated soleus stretch (left foot met heads positioned edge of 2" step) lacking 1.5 inches for heel contact  DTRs:    POSTURE: weight shifts right  LOWER EXTREMITY ROM:     Active  Right Eval Left Eval  Hip flexion    Hip extension    Hip abduction    Hip adduction    Hip internal rotation    Hip external rotation    Knee flexion    Knee extension    Ankle dorsiflexion 15 -10  Ankle plantarflexion    Ankle inversion    Ankle eversion     (Blank rows = not tested)  LOWER EXTREMITY MMT:    MMT Right Eval Left Eval  Hip flexion 5 3-  Hip extension    Hip abduction (in sitting) 5 4-  Hip adduction (in sitting) 5 4  Hip internal rotation    Hip external rotation    Knee flexion 5 4-  Knee extension 5 4  Ankle dorsiflexion 5 0  Ankle plantarflexion 4 0  Ankle inversion 5 0  Ankle eversion 5 0  (Blank rows = not tested)  BED MOBILITY:  NT  TRANSFERS: Assistive device utilized: Single point cane  Sit to stand: Complete Independence Stand to sit: Complete Independence and Modified independence Chair to chair: Complete Independence and Modified independence Floor:  NT  RAMP:  NT  CURB:  Level of Assistance: SBA and CGA Assistive device utilized: Single point cane Curb Comments: must lead up with RLE and descend w/ LLE  STAIRS:  Level of Assistance: Modified independence  Stair Negotiation Technique: Step to Pattern with Single Rail on Right or cane  Number of Stairs: 4   Height of Stairs: 6  Comments: turns slightly sideways  GAIT: Gait pattern: decreased stride length, circumduction- Left, Left foot flat, abducted- Left, and poor foot clearance- Left Distance walked: 80 ft Assistive device utilized: Single point cane Level of assistance:  Modified independence Comments: level surfaces  FUNCTIONAL TESTs:  5 times sit to stand: 13.90 sec Timed up and go (TUG): 26 sec w/ HUrrycane 10 meter walk test: 1.3 ft/sec Berg Balance Scale: 45/56  PATIENT SURVEYS:  NT  TODAY'S TREATMENT:  Evaluation, education/demonstration of seated soleus/achilles stretch placing ball of foot on edge of step and attempting to get heel to ground. Discussion regarding  bracing intervention and rationale   PATIENT EDUCATION: Education details: see above Person educated: Patient and Parent Education method: Customer service manager Education comprehension: verbalized understanding   HOME EXERCISE PROGRAM: TBD    GOALS: Goals reviewed with patient? Yes  SHORT TERM GOALS: Target date: 06/12/2022  Patient will be independent in HEP to improve functional outcomes Baseline: Goal status: INITIAL  2.  Pt to be able to don/doff left AFO to improve functional independence and safety with transfers and gait Baseline: N/A Goal status: INITIAL    LONG TERM GOALS: Target date: 07/10/2022  Demonstrate improved safety and speed of ambulation per time 18 sec TUG test to reduce risk for falls Baseline: 26 sec Goal status: INITIAL  2.  Demonstrate improved efficiency of gait by ambulating with speed of 2.0 ft/sec for increased independence in community Baseline: 1.3 ft/sec Goal status: INITIAL   ASSESSMENT:  CLINICAL IMPRESSION: Patient is a 51 y.o. male who was seen today for physical therapy evaluation and treatment for left foot drop and movement deficits from chronic CVA affecting his left side. Demonstrates gait abnormalities and deviations with high risk for falls per functional tests and ambulates with slow, tedious pattern limiting his community-level participation.  Pt would benefit from PT services to train/instruct in AE, AD, orthotic intervention to improve ambulation safety, dynamic balance, and speed of ambulation.   Demonstrates profound motor control deficits and weakness affecting left side and would benefit from aquatic therapy intervention. Patient would benefit from aquatic-based PT intervention. This approach allows for a whole-body focus, with special attention on activating and relearning proper biomechanics in injured areas. Additionally, water as a modality can both strengthen and facilitate the whole body; water's unique properties can reduce impact of exercise on joints, increase pain-free movement, improve microcirculation, and help increase muscle tone through natural resistance to improve this patient's functional mobility    OBJECTIVE IMPAIRMENTS Abnormal gait, decreased activity tolerance, decreased balance, decreased coordination, decreased knowledge of condition, decreased knowledge of use of DME, difficulty walking, decreased ROM, decreased strength, impaired flexibility, and impaired tone.   ACTIVITY LIMITATIONS carrying, standing, stairs, transfers, and locomotion level  PARTICIPATION LIMITATIONS: cleaning, shopping, community activity, and yard work  PERSONAL FACTORS Time since onset of injury/illness/exacerbation and 1-2 comorbidities: DM, HTN, CKD  are also affecting patient's functional outcome.   REHAB POTENTIAL: Good  CLINICAL DECISION MAKING: Stable/uncomplicated  EVALUATION COMPLEXITY: Low  PLAN: PT FREQUENCY: 1x/week  PT DURATION: 8 weeks  PLANNED INTERVENTIONS: Therapeutic exercises, Therapeutic activity, Neuromuscular re-education, Balance training, Gait training, Patient/Family education, Self Care, Joint mobilization, Stair training, Vestibular training, Canalith repositioning, Orthotic/Fit training, DME instructions, Aquatic Therapy, Dry Needling, Electrical stimulation, Wheelchair mobility training, Taping, and Manual therapy  PLAN FOR NEXT SESSION: AFO trials compare stability and gait speed   12:57 PM, 05/15/22 M. Sherlyn Lees, PT, DPT Physical Therapist-  [place] Office Number: [phone]

## 2022-05-22 ENCOUNTER — Ambulatory Visit: Payer: Medicaid Other

## 2022-05-22 ENCOUNTER — Encounter: Payer: Self-pay | Admitting: Physical Medicine & Rehabilitation

## 2022-05-22 ENCOUNTER — Telehealth: Payer: Self-pay

## 2022-05-22 DIAGNOSIS — R262 Difficulty in walking, not elsewhere classified: Secondary | ICD-10-CM

## 2022-05-22 DIAGNOSIS — M6281 Muscle weakness (generalized): Secondary | ICD-10-CM

## 2022-05-22 DIAGNOSIS — M21372 Foot drop, left foot: Secondary | ICD-10-CM | POA: Insufficient documentation

## 2022-05-22 DIAGNOSIS — R2681 Unsteadiness on feet: Secondary | ICD-10-CM

## 2022-05-22 NOTE — Therapy (Signed)
OUTPATIENT PHYSICAL THERAPY NEURO TREATMENT   Patient Name: Tony Schmitt MRN: [id number] DOB:[birth year], 51 y.o., male Today's Date: 05/22/2022   PCP: Charlott Rakes, MD REFERRING PROVIDER: Charlett Blake, MD    PT End of Session - 05/22/22 [id number]     Visit Number 2    Number of Visits 8    Date for PT Re-Evaluation 07/10/22    Authorization Type [place] Medicaid UHC    Authorization Time Period 27 VL    Authorization - Visit Number 1    Authorization - Number of Visits 10    PT Start Time 0277    PT Stop Time 1015    PT Time Calculation (min) 40 min    Activity Tolerance Patient tolerated treatment well    Behavior During Therapy Flat affect             Past Medical History:  Diagnosis Date   Chronic kidney disease    Diabetes mellitus without complication ([place])    Hypertension    Stroke [facility])    Past Surgical History:  Procedure Laterality Date   BUBBLE STUDY  04/18/2020   Procedure: BUBBLE STUDY;  Surgeon: Sueanne Margarita, MD;  Location: [place];  Service: Cardiovascular;;   TEE WITHOUT CARDIOVERSION N/A 04/18/2020   Procedure: TRANSESOPHAGEAL ECHOCARDIOGRAM (TEE);  Surgeon: Sueanne Margarita, MD;  Location: [facility] ENDOSCOPY;  Service: Cardiovascular;  Laterality: N/A;   Patient Active Problem List   Diagnosis Date Noted   Nephrotic syndrome    Peripheral edema    Abnormality of gait 05/23/2020   Type 2 diabetes mellitus with hyperglycemia, without long-term current use of insulin (HCC)    Thrombocytosis    Benign essential HTN    Hypoalbuminemia due to protein-calorie malnutrition (HCC)    Acute blood loss anemia    Leukocytosis    Slow transit constipation    AKI (acute kidney injury) ([place])    Diabetes mellitus type 2 with complications, uncontrolled    CVA (cerebral vascular accident) ([place]) 04/22/2020   Acute left-sided weakness    Tobacco abuse    Essential hypertension    Acute CVA (cerebrovascular accident) ([place]) 04/16/2020   Hypertensive  urgency 04/16/2020   Type 2 diabetes mellitus with vascular disease ([place]) 04/16/2020   CKD (chronic kidney disease) stage 3, GFR 30-59 ml/min ([place]) 04/23/2019   Hyperlipidemia 04/18/2019   Dyslipidemia associated with type 2 diabetes mellitus ([place]) 08/29/2018   Hypertension associated with type 2 diabetes mellitus ([place]) 08/29/2018    ONSET DATE: 04/16/20  REFERRING DIAG: A12.878 (ICD-10-CM) - Acquired left foot drop G81.10 (ICD-10-CM) - Spastic hemiplegia, unspecified etiology, unspecified laterality   THERAPY DIAG:  Difficulty in walking, not elsewhere classified  Muscle weakness (generalized)  Unsteadiness on feet  Rationale for Evaluation and Treatment Rehabilitation  SUBJECTIVE:  SUBJECTIVE STATEMENT: Nothing new  Pt accompanied by: family member (mother)  PERTINENT HISTORY: CKD, DM, HTN, stroke  PAIN:  Are you having pain? No  PRECAUTIONS: None  WEIGHT BEARING RESTRICTIONS No  FALLS: Has patient fallen in last 6 months? No  LIVING ENVIRONMENT: Lives with: lives with their family Lives in: House/apartment, one-level Stairs: Yes: External: 2 steps; none Has following equipment at home: Single point cane and hurrycane  PLOF: Independent with basic ADLs, Independent with household mobility with device, and uses manual wheelchair for community distances (someone pushes)  PATIENT GOALS be able to hold stuff in left hand  OBJECTIVE:   TODAY'S TREATMENT: 05/22/22 Activity Comments  Gait trials w/ left AFO  1) PLS AFO -10 meter walk test 24.66 sec=    -TUG test 22.88 sec 2) ground reaction: -10 meter walk: 22.4 sec -TUG test: 22.8 sec 3) PLS AFO w/ medial stay -TUG test: 19.62 sec -10 meter walk: 21.91 sec                    DIAGNOSTIC FINDINGS: imaging studies at  time of CVA  COGNITION: Overall cognitive status: Within functional limits for tasks assessed   SENSATION: Light touch: Impaired  LUE/LLE  COORDINATION: Grossly impaired LUE/LLE  EDEMA:  Minimal in LE, Left hand   MUSCLE TONE: LLE: Flaccid   MUSCLE LENGTH: Left achilles tightness. Lacking 5-10 degrees for plantigrade. With seated soleus stretch (left foot met heads positioned edge of 2" step) lacking 1.5 inches for heel contact  DTRs:    POSTURE: weight shifts right  LOWER EXTREMITY ROM:     Active  Right Eval Left Eval  Hip flexion    Hip extension    Hip abduction    Hip adduction    Hip internal rotation    Hip external rotation    Knee flexion    Knee extension    Ankle dorsiflexion 15 -10  Ankle plantarflexion    Ankle inversion    Ankle eversion     (Blank rows = not tested)  LOWER EXTREMITY MMT:    MMT Right Eval Left Eval  Hip flexion 5 3-  Hip extension    Hip abduction (in sitting) 5 4-  Hip adduction (in sitting) 5 4  Hip internal rotation    Hip external rotation    Knee flexion 5 4-  Knee extension 5 4  Ankle dorsiflexion 5 0  Ankle plantarflexion 4 0  Ankle inversion 5 0  Ankle eversion 5 0  (Blank rows = not tested)  BED MOBILITY:  NT  TRANSFERS: Assistive device utilized: Single point cane  Sit to stand: Complete Independence Stand to sit: Complete Independence and Modified independence Chair to chair: Complete Independence and Modified independence Floor:  NT  RAMP:  NT  CURB:  Level of Assistance: SBA and CGA Assistive device utilized: Single point cane Curb Comments: must lead up with RLE and descend w/ LLE  STAIRS:  Level of Assistance: Modified independence  Stair Negotiation Technique: Step to Pattern with Single Rail on Right or cane  Number of Stairs: 4   Height of Stairs: 6  Comments: turns slightly sideways  GAIT: Gait pattern: decreased stride length, circumduction- Left, Left foot flat, abducted-  Left, and poor foot clearance- Left Distance walked: 80 ft Assistive device utilized: Single point cane Level of assistance: Modified independence Comments: level surfaces  FUNCTIONAL TESTs:  5 times sit to stand: 13.90 sec Timed up and go (TUG): 26 sec w/  HUrrycane 10 meter walk test: 1.3 ft/sec Berg Balance Scale: 45/56  PATIENT SURVEYS:  NT  TODAY'S TREATMENT:  Evaluation, education/demonstration of seated soleus/achilles stretch placing ball of foot on edge of step and attempting to get heel to ground. Discussion regarding bracing intervention and rationale   PATIENT EDUCATION: Education details: see above Person educated: Patient and Parent Education method: Customer service manager Education comprehension: verbalized understanding   HOME EXERCISE PROGRAM: TBD    GOALS: Goals reviewed with patient? Yes  SHORT TERM GOALS: Target date: 06/12/2022  Patient will be independent in HEP to improve functional outcomes Baseline: Goal status: on-going  2.  Pt to be able to don/doff left AFO to improve functional independence and safety with transfers and gait Baseline: N/A Goal status: on-going    LONG TERM GOALS: Target date: 07/10/2022  Demonstrate improved safety and speed of ambulation per time 18 sec TUG test to reduce risk for falls Baseline: 26 sec Goal status: INITIAL  2.  Demonstrate improved efficiency of gait by ambulating with speed of 2.0 ft/sec for increased independence in community Baseline: 1.3 ft/sec Goal status: INITIAL   ASSESSMENT:  CLINICAL IMPRESSION: Trial of 3 AFO's that we have in clinic for purpose of trial/error and exposure to device performing trials of 10 meter walk test and TUG test as compared to no device as performed at initial evaluation with increased gait speed present per 10 meter walk test with device vs without.  Marked improvement in gait mechanics with ground-reaction AFO vs PLS with ability to assume a more narrow  BOS and nearly eliminates his tendency for circumduction albeit he tends to load with increased knee flexion at initial contact/loading response with this device due to LLE weakness.  Patient would benefit from continued trials of AFO and may do well with a more custom AFO to address his transfers and gait deficits from LLE weakness/foot drop   OBJECTIVE IMPAIRMENTS Abnormal gait, decreased activity tolerance, decreased balance, decreased coordination, decreased knowledge of condition, decreased knowledge of use of DME, difficulty walking, decreased ROM, decreased strength, impaired flexibility, and impaired tone.   ACTIVITY LIMITATIONS carrying, standing, stairs, transfers, and locomotion level  PARTICIPATION LIMITATIONS: cleaning, shopping, community activity, and yard work  PERSONAL FACTORS Time since onset of injury/illness/exacerbation and 1-2 comorbidities: DM, HTN, CKD  are also affecting patient's functional outcome.   REHAB POTENTIAL: Good  CLINICAL DECISION MAKING: Stable/uncomplicated  EVALUATION COMPLEXITY: Low  PLAN: PT FREQUENCY: 1x/week  PT DURATION: 8 weeks  PLANNED INTERVENTIONS: Therapeutic exercises, Therapeutic activity, Neuromuscular re-education, Balance training, Gait training, Patient/Family education, Self Care, Joint mobilization, Stair training, Vestibular training, Canalith repositioning, Orthotic/Fit training, DME instructions, Aquatic Therapy, Dry Needling, Electrical stimulation, Wheelchair mobility training, Taping, and Manual therapy  PLAN FOR NEXT SESSION: AFO trials compare stability and gait speed   9:38 AM, 05/22/22 M. Sherlyn Lees, PT, DPT Physical Therapist- [place] Office Number: [phone]

## 2022-05-22 NOTE — Telephone Encounter (Signed)
Hi Dr. Letta Pate,  Tony Schmitt is being treated by physical therapy for acquired left foot drop from CVA.  Tony Schmitt. Tony Schmitt will benefit from use of left AFO in order to improve safety with functional mobility.    Due to insurance regulations, patients must have a face-to-face visit with a physician within the last 6 months wherein the benefit of bracing has been discussed and documented in the patient file. You may want to have the patient schedule a new appointment with you, or if you feel comfortable you may addend your previous note with the patient, stating that orthotics were discussed.   If you agree, please submit request in EPIC under MD Order, Other Orders (list Left AFO in comments) and please include the ICD-10 code, MD signature, and NPI. You may also fax to [facility] at [phone].   Thank you, 11:43 AM, 05/22/22 M. Sherlyn Lees, PT, DPT Physical Therapist- [place] Office Number: [phone]    [facility] [address] [place], [place]  [address] Phone:  [phone] Fax:  [phone]

## 2022-05-25 ENCOUNTER — Ambulatory Visit (HOSPITAL_COMMUNITY)
Admission: RE | Admit: 2022-05-25 | Discharge: 2022-05-25 | Disposition: A | Discharge status: Home / Self Care | Payer: Medicaid Other | Source: Ambulatory Visit | Attending: Nephrology | Admitting: Nephrology

## 2022-05-25 VITALS — BP 148/78 | HR 77 | Temp 97.5°F | Resp 20

## 2022-05-25 DIAGNOSIS — N183 Chronic kidney disease, stage 3 unspecified: Secondary | ICD-10-CM | POA: Diagnosis not present

## 2022-05-25 LAB — CBC WITH DIFFERENTIAL/PLATELET
Abs Immature Granulocytes: 0.05 10*3/uL (ref 0.00–0.07)
Basophils Absolute: 0.1 10*3/uL (ref 0.0–0.1)
Basophils Relative: 1 %
Eosinophils Absolute: 0.2 10*3/uL (ref 0.0–0.5)
Eosinophils Relative: 2 %
HCT: 30 % — ABNORMAL LOW (ref 39.0–52.0)
Hemoglobin: 10 g/dL — ABNORMAL LOW (ref 13.0–17.0)
Immature Granulocytes: 1 %
Lymphocytes Relative: 26 %
Lymphs Abs: 2.2 10*3/uL (ref 0.7–4.0)
MCH: 27.8 pg (ref 26.0–34.0)
MCHC: 33.3 g/dL (ref 30.0–36.0)
MCV: 83.3 fL (ref 80.0–100.0)
Monocytes Absolute: 0.6 10*3/uL (ref 0.1–1.0)
Monocytes Relative: 7 %
Neutro Abs: 5.5 10*3/uL (ref 1.7–7.7)
Neutrophils Relative %: 63 %
Platelets: 334 10*3/uL (ref 150–400)
RBC: 3.6 MIL/uL — ABNORMAL LOW (ref 4.22–5.81)
RDW: 15.1 % (ref 11.5–15.5)
WBC: 8.8 10*3/uL (ref 4.0–10.5)
nRBC: 0 % (ref 0.0–0.2)

## 2022-05-25 LAB — POCT HEMOGLOBIN-HEMACUE: Hemoglobin: 10.2 g/dL — ABNORMAL LOW (ref 13.0–17.0)

## 2022-05-25 LAB — RENAL FUNCTION PANEL
Albumin: 3.4 g/dL — ABNORMAL LOW (ref 3.5–5.0)
Anion gap: 14 (ref 5–15)
BUN: 46 mg/dL — ABNORMAL HIGH (ref 6–20)
CO2: 23 mmol/L (ref 22–32)
Calcium: 9.9 mg/dL (ref 8.9–10.3)
Chloride: 104 mmol/L (ref 98–111)
Creatinine, Ser: 4.75 mg/dL — ABNORMAL HIGH (ref 0.61–1.24)
GFR, Estimated: 14 mL/min — ABNORMAL LOW (ref >60)
Glucose, Bld: 195 mg/dL — ABNORMAL HIGH (ref 70–99)
Phosphorus: 2.9 mg/dL (ref 2.5–4.6)
Potassium: 3.7 mmol/L (ref 3.5–5.1)
Sodium: 141 mmol/L (ref 135–145)

## 2022-05-25 MED ORDER — EPOETIN ALFA-EPBX 10000 UNIT/ML IJ SOLN
INTRAMUSCULAR | Status: AC
  Filled 2022-05-25: qty 2

## 2022-05-25 MED ORDER — EPOETIN ALFA-EPBX 10000 UNIT/ML IJ SOLN
20000.0000 [IU] | Freq: Once | INTRAMUSCULAR | Status: AC
  Administered 2022-05-25: 20000 [IU] via SUBCUTANEOUS

## 2022-05-29 ENCOUNTER — Ambulatory Visit: Payer: Medicaid Other

## 2022-05-29 DIAGNOSIS — R2681 Unsteadiness on feet: Secondary | ICD-10-CM

## 2022-05-29 DIAGNOSIS — R262 Difficulty in walking, not elsewhere classified: Secondary | ICD-10-CM

## 2022-05-29 DIAGNOSIS — M6281 Muscle weakness (generalized): Secondary | ICD-10-CM

## 2022-05-29 NOTE — Therapy (Signed)
OUTPATIENT PHYSICAL THERAPY NEURO TREATMENT   Patient Name: Tony Schmitt MRN: [id number] DOB:27-Apr-1971, 51 y.o., male Today's Date: 05/29/2022   PCP: Charlott Rakes, MD REFERRING PROVIDER: Charlett Blake, MD    PT End of Session - 05/29/22 1155     Visit Number 3    Number of Visits 8    Date for PT Re-Evaluation 07/10/22    Authorization Type [place] Medicaid UHC    Authorization Time Period 27 VL    Authorization - Visit Number 2    Authorization - Number of Visits 5    PT Start Time 3491   late arrival   PT Stop Time 1230    PT Time Calculation (min) 37 min    Activity Tolerance Patient tolerated treatment well    Behavior During Therapy Flat affect             Past Medical History:  Diagnosis Date   Chronic kidney disease    Diabetes mellitus without complication ([place])    Hypertension    Stroke [facility])    Past Surgical History:  Procedure Laterality Date   BUBBLE STUDY  04/18/2020   Procedure: BUBBLE STUDY;  Surgeon: Sueanne Margarita, MD;  Location: [place];  Service: Cardiovascular;;   TEE WITHOUT CARDIOVERSION N/A 04/18/2020   Procedure: TRANSESOPHAGEAL ECHOCARDIOGRAM (TEE);  Surgeon: Sueanne Margarita, MD;  Location: [facility] ENDOSCOPY;  Service: Cardiovascular;  Laterality: N/A;   Patient Active Problem List   Diagnosis Date Noted   Acquired left foot drop 05/22/2022   Nephrotic syndrome    Peripheral edema    Abnormality of gait 05/23/2020   Type 2 diabetes mellitus with hyperglycemia, without long-term current use of insulin (HCC)    Thrombocytosis    Benign essential HTN    Hypoalbuminemia due to protein-calorie malnutrition (HCC)    Acute blood loss anemia    Leukocytosis    Slow transit constipation    AKI (acute kidney injury) ([place])    Diabetes mellitus type 2 with complications, uncontrolled    CVA (cerebral vascular accident) ([place]) 04/22/2020   Acute left-sided weakness    Tobacco abuse    Essential hypertension    Acute CVA  (cerebrovascular accident) ([place]) 04/16/2020   Hypertensive urgency 04/16/2020   Type 2 diabetes mellitus with vascular disease ([place]) 04/16/2020   CKD (chronic kidney disease) stage 3, GFR 30-59 ml/min ([place]) 04/23/2019   Hyperlipidemia 04/18/2019   Dyslipidemia associated with type 2 diabetes mellitus ([place]) 08/29/2018   Hypertension associated with type 2 diabetes mellitus ([place]) 08/29/2018    ONSET DATE: 04/16/20  REFERRING DIAG: P91.505 (ICD-10-CM) - Acquired left foot drop G81.10 (ICD-10-CM) - Spastic hemiplegia, unspecified etiology, unspecified laterality   THERAPY DIAG:  Difficulty in walking, not elsewhere classified  Muscle weakness (generalized)  Unsteadiness on feet  Rationale for Evaluation and Treatment Rehabilitation  SUBJECTIVE:  SUBJECTIVE STATEMENT: Nothing new. Therapist received Rx for left AFO from Dr. Letta Pate and provided to patient.  Pt accompanied by: family member (mother)  PERTINENT HISTORY: CKD, DM, HTN, stroke  PAIN:  Are you having pain? No  PRECAUTIONS: None  WEIGHT BEARING RESTRICTIONS No  FALLS: Has patient fallen in last 6 months? No  LIVING ENVIRONMENT: Lives with: lives with their family Lives in: House/apartment, one-level Stairs: Yes: External: 2 steps; none Has following equipment at home: Single point cane and hurrycane  PLOF: Independent with basic ADLs, Independent with household mobility with device, and uses manual wheelchair for community distances (someone pushes)  PATIENT GOALS be able to hold stuff in left hand  OBJECTIVE:   TODAY'S TREATMENT: 05/29/22 Activity Comments  RLE on step 4x30 sec, 4x15 sec eyes closed For LLE forced weight bearing  LLE lunge on 6" step 4x10 Therapist guiding for sequence and ROM  Step length activities  Facilitation of LLE stance phase control to enable knee flexion in stance phase and enhance RLE step length  Sidestepping x 2 min  At counter, cues for toes pointed straight  Gastroc stretch 2x2 min W/ slantboard, therapist facilitating left knee extension in order to promote plantigrade and inhibit plantarflexor tone       TODAY'S TREATMENT: 05/22/22 Activity Comments  Gait trials w/ left AFO  1) PLS AFO -10 meter walk test 24.66 sec=    -TUG test 22.88 sec 2) ground reaction: -10 meter walk: 22.4 sec -TUG test: 22.8 sec 3) PLS AFO w/ medial stay -TUG test: 19.62 sec -10 meter walk: 21.91 sec                    DIAGNOSTIC FINDINGS: imaging studies at time of CVA  COGNITION: Overall cognitive status: Within functional limits for tasks assessed   SENSATION: Light touch: Impaired  LUE/LLE  COORDINATION: Grossly impaired LUE/LLE  EDEMA:  Minimal in LE, Left hand   MUSCLE TONE: LLE: Flaccid   MUSCLE LENGTH: Left achilles tightness. Lacking 5-10 degrees for plantigrade. With seated soleus stretch (left foot met heads positioned edge of 2" step) lacking 1.5 inches for heel contact  DTRs:    POSTURE: weight shifts right  LOWER EXTREMITY ROM:     Active  Right Eval Left Eval  Hip flexion    Hip extension    Hip abduction    Hip adduction    Hip internal rotation    Hip external rotation    Knee flexion    Knee extension    Ankle dorsiflexion 15 -10  Ankle plantarflexion    Ankle inversion    Ankle eversion     (Blank rows = not tested)  LOWER EXTREMITY MMT:    MMT Right Eval Left Eval  Hip flexion 5 3-  Hip extension    Hip abduction (in sitting) 5 4-  Hip adduction (in sitting) 5 4  Hip internal rotation    Hip external rotation    Knee flexion 5 4-  Knee extension 5 4  Ankle dorsiflexion 5 0  Ankle plantarflexion 4 0  Ankle inversion 5 0  Ankle eversion 5 0  (Blank rows = not tested)  BED MOBILITY:  NT  TRANSFERS: Assistive device  utilized: Single point cane  Sit to stand: Complete Independence Stand to sit: Complete Independence and Modified independence Chair to chair: Complete Independence and Modified independence Floor:  NT  RAMP:  NT  CURB:  Level of Assistance: SBA and CGA Assistive device utilized:  Single point cane Curb Comments: must lead up with RLE and descend w/ LLE  STAIRS:  Level of Assistance: Modified independence  Stair Negotiation Technique: Step to Pattern with Single Rail on Right or cane  Number of Stairs: 4   Height of Stairs: 6  Comments: turns slightly sideways  GAIT: Gait pattern: decreased stride length, circumduction- Left, Left foot flat, abducted- Left, and poor foot clearance- Left Distance walked: 80 ft Assistive device utilized: Single point cane Level of assistance: Modified independence Comments: level surfaces  FUNCTIONAL TESTs:  5 times sit to stand: 13.90 sec Timed up and go (TUG): 26 sec w/ HUrrycane 10 meter walk test: 1.3 ft/sec Berg Balance Scale: 45/56  PATIENT SURVEYS:  NT  TODAY'S TREATMENT:  Evaluation, education/demonstration of seated soleus/achilles stretch placing ball of foot on edge of step and attempting to get heel to ground. Discussion regarding bracing intervention and rationale   PATIENT EDUCATION: Education details: see above Person educated: Patient and Parent Education method: Customer service manager Education comprehension: verbalized understanding   HOME EXERCISE PROGRAM: TBD    GOALS: Goals reviewed with patient? Yes  SHORT TERM GOALS: Target date: 06/12/2022  Patient will be independent in HEP to improve functional outcomes Baseline: Goal status: on-going  2.  Pt to be able to don/doff left AFO to improve functional independence and safety with transfers and gait Baseline: N/A Goal status: on-going    LONG TERM GOALS: Target date: 07/10/2022  Demonstrate improved safety and speed of ambulation per time 18  sec TUG test to reduce risk for falls Baseline: 26 sec Goal status: INITIAL  2.  Demonstrate improved efficiency of gait by ambulating with speed of 2.0 ft/sec for increased independence in community Baseline: 1.3 ft/sec Goal status: INITIAL   ASSESSMENT:  CLINICAL IMPRESSION: Treatment emphasis on LLE stance phase control and forced weight bearing to improve stability and coordination of co-contraction for LE loading response. Step length activities to improve efficiency of gait. Stretching to enhance plantigrade posture left foot to prepare for orthotic intervention   OBJECTIVE IMPAIRMENTS Abnormal gait, decreased activity tolerance, decreased balance, decreased coordination, decreased knowledge of condition, decreased knowledge of use of DME, difficulty walking, decreased ROM, decreased strength, impaired flexibility, and impaired tone.   ACTIVITY LIMITATIONS carrying, standing, stairs, transfers, and locomotion level  PARTICIPATION LIMITATIONS: cleaning, shopping, community activity, and yard work  PERSONAL FACTORS Time since onset of injury/illness/exacerbation and 1-2 comorbidities: DM, HTN, CKD  are also affecting patient's functional outcome.   REHAB POTENTIAL: Good  CLINICAL DECISION MAKING: Stable/uncomplicated  EVALUATION COMPLEXITY: Low  PLAN: PT FREQUENCY: 1x/week  PT DURATION: 8 weeks  PLANNED INTERVENTIONS: Therapeutic exercises, Therapeutic activity, Neuromuscular re-education, Balance training, Gait training, Patient/Family education, Self Care, Joint mobilization, Stair training, Vestibular training, Canalith repositioning, Orthotic/Fit training, DME instructions, Aquatic Therapy, Dry Needling, Electrical stimulation, Wheelchair mobility training, Taping, and Manual therapy  PLAN FOR NEXT SESSION: continued trials with step length   11:55 AM, 05/29/22 M. Sherlyn Lees, PT, DPT Physical Therapist- [place] Office Number: [phone]

## 2022-06-05 ENCOUNTER — Ambulatory Visit: Payer: Medicaid Other

## 2022-06-05 DIAGNOSIS — R2681 Unsteadiness on feet: Secondary | ICD-10-CM

## 2022-06-05 DIAGNOSIS — R262 Difficulty in walking, not elsewhere classified: Secondary | ICD-10-CM | POA: Diagnosis not present

## 2022-06-05 DIAGNOSIS — M6281 Muscle weakness (generalized): Secondary | ICD-10-CM

## 2022-06-05 NOTE — Therapy (Signed)
OUTPATIENT PHYSICAL THERAPY NEURO TREATMENT   Patient Name: Tony Schmitt MRN: [id number] DOB:03-02-1971, 51 y.o., male Today's Date: 06/05/2022   PCP: Charlott Rakes, MD REFERRING PROVIDER: Charlett Blake, MD    PT End of Session - 06/05/22 1316     Visit Number 4    Number of Visits 8    Date for PT Re-Evaluation 07/10/22    Authorization Type [place] Medicaid UHC    Authorization Time Period 27 VL    Authorization - Visit Number 3    Authorization - Number of Visits 27    PT Start Time 2761    PT Stop Time 1400    PT Time Calculation (min) 45 min    Activity Tolerance Patient tolerated treatment well    Behavior During Therapy Flat affect             Past Medical History:  Diagnosis Date   Chronic kidney disease    Diabetes mellitus without complication ([place])    Hypertension    Stroke [facility])    Past Surgical History:  Procedure Laterality Date   BUBBLE STUDY  04/18/2020   Procedure: BUBBLE STUDY;  Surgeon: Sueanne Margarita, MD;  Location: [facility];  Service: Cardiovascular;;   TEE WITHOUT CARDIOVERSION N/A 04/18/2020   Procedure: TRANSESOPHAGEAL ECHOCARDIOGRAM (TEE);  Surgeon: Sueanne Margarita, MD;  Location: [facility] ENDOSCOPY;  Service: Cardiovascular;  Laterality: N/A;   Patient Active Problem List   Diagnosis Date Noted   Acquired left foot drop 05/22/2022   Nephrotic syndrome    Peripheral edema    Abnormality of gait 05/23/2020   Type 2 diabetes mellitus with hyperglycemia, without long-term current use of insulin (HCC)    Thrombocytosis    Benign essential HTN    Hypoalbuminemia due to protein-calorie malnutrition (HCC)    Acute blood loss anemia    Leukocytosis    Slow transit constipation    AKI (acute kidney injury) ([place])    Diabetes mellitus type 2 with complications, uncontrolled    CVA (cerebral vascular accident) ([place]) 04/22/2020   Acute left-sided weakness    Tobacco abuse    Essential hypertension    Acute CVA (cerebrovascular accident)  ([place]) 04/16/2020   Hypertensive urgency 04/16/2020   Type 2 diabetes mellitus with vascular disease ([place]) 04/16/2020   CKD (chronic kidney disease) stage 3, GFR 30-59 ml/min ([place]) 04/23/2019   Hyperlipidemia 04/18/2019   Dyslipidemia associated with type 2 diabetes mellitus ([place]) 08/29/2018   Hypertension associated with type 2 diabetes mellitus ([place]) 08/29/2018    ONSET DATE: 04/16/20  REFERRING DIAG: O48.592 (ICD-10-CM) - Acquired left foot drop G81.10 (ICD-10-CM) - Spastic hemiplegia, unspecified etiology, unspecified laterality   THERAPY DIAG:  Difficulty in walking, not elsewhere classified  Muscle weakness (generalized)  Unsteadiness on feet  Rationale for Evaluation and Treatment Rehabilitation  SUBJECTIVE:  SUBJECTIVE STATEMENT: Scheduled AFO appointment  Pt accompanied by: family member (mother)  PERTINENT HISTORY: CKD, DM, HTN, stroke  PAIN:  Are you having pain? No  PRECAUTIONS: None  WEIGHT BEARING RESTRICTIONS No  FALLS: Has patient fallen in last 6 months? No  LIVING ENVIRONMENT: Lives with: lives with their family Lives in: House/apartment, one-level Stairs: Yes: External: 2 steps; none Has following equipment at home: Single point cane and hurrycane  PLOF: Independent with basic ADLs, Independent with household mobility with device, and uses manual wheelchair for community distances (someone pushes)  PATIENT GOALS be able to hold stuff in left hand  OBJECTIVE:   TODAY'S TREATMENT: 06/05/22 Activity Comments  Trial with Giv-Mohr Minimal effect  NU-step x 8 min For fast, alternating movement, therapist facilitating LLE extension  Foot on step RLE elevated on 8" step for forced 2x60 sec  Lunges 4x10 RLE on BOSU, therapist facilitating movement and sustained hold  x 3 sec  Gastroc stretch slantboard 2x2 min RLE supported by therapist 2x       TODAY'S TREATMENT: 05/29/22 Activity Comments  RLE on step 4x30 sec, 4x15 sec eyes closed For LLE forced weight bearing  LLE lunge on 6" step 4x10 Therapist guiding for sequence and ROM  Step length activities Facilitation of LLE stance phase control to enable knee flexion in stance phase and enhance RLE step length  Sidestepping x 2 min  At counter, cues for toes pointed straight  Gastroc stretch 2x2 min W/ slantboard, therapist facilitating left knee extension in order to promote plantigrade and inhibit plantarflexor tone       TODAY'S TREATMENT: 05/22/22 Activity Comments  Gait trials w/ left AFO  1) PLS AFO -10 meter walk test 24.66 sec=    -TUG test 22.88 sec 2) ground reaction: -10 meter walk: 22.4 sec -TUG test: 22.8 sec 3) PLS AFO w/ medial stay -TUG test: 19.62 sec -10 meter walk: 21.91 sec                    DIAGNOSTIC FINDINGS: imaging studies at time of CVA  COGNITION: Overall cognitive status: Within functional limits for tasks assessed   SENSATION: Light touch: Impaired  LUE/LLE  COORDINATION: Grossly impaired LUE/LLE  EDEMA:  Minimal in LE, Left hand   MUSCLE TONE: LLE: Flaccid   MUSCLE LENGTH: Left achilles tightness. Lacking 5-10 degrees for plantigrade. With seated soleus stretch (left foot met heads positioned edge of 2" step) lacking 1.5 inches for heel contact  DTRs:    POSTURE: weight shifts right  LOWER EXTREMITY ROM:     Active  Right Eval Left Eval  Hip flexion    Hip extension    Hip abduction    Hip adduction    Hip internal rotation    Hip external rotation    Knee flexion    Knee extension    Ankle dorsiflexion 15 -10  Ankle plantarflexion    Ankle inversion    Ankle eversion     (Blank rows = not tested)  LOWER EXTREMITY MMT:    MMT Right Eval Left Eval  Hip flexion 5 3-  Hip extension    Hip abduction (in sitting) 5 4-  Hip  adduction (in sitting) 5 4  Hip internal rotation    Hip external rotation    Knee flexion 5 4-  Knee extension 5 4  Ankle dorsiflexion 5 0  Ankle plantarflexion 4 0  Ankle inversion 5 0  Ankle eversion 5 0  (Blank rows =  not tested)  BED MOBILITY:  NT  TRANSFERS: Assistive device utilized: Single point cane  Sit to stand: Complete Independence Stand to sit: Complete Independence and Modified independence Chair to chair: Complete Independence and Modified independence Floor:  NT  RAMP:  NT  CURB:  Level of Assistance: SBA and CGA Assistive device utilized: Single point cane Curb Comments: must lead up with RLE and descend w/ LLE  STAIRS:  Level of Assistance: Modified independence  Stair Negotiation Technique: Step to Pattern with Single Rail on Right or cane  Number of Stairs: 4   Height of Stairs: 6  Comments: turns slightly sideways  GAIT: Gait pattern: decreased stride length, circumduction- Left, Left foot flat, abducted- Left, and poor foot clearance- Left Distance walked: 80 ft Assistive device utilized: Single point cane Level of assistance: Modified independence Comments: level surfaces  FUNCTIONAL TESTs:  5 times sit to stand: 13.90 sec Timed up and go (TUG): 26 sec w/ HUrrycane 10 meter walk test: 1.3 ft/sec Berg Balance Scale: 45/56  PATIENT SURVEYS:  NT  TODAY'S TREATMENT:  Evaluation, education/demonstration of seated soleus/achilles stretch placing ball of foot on edge of step and attempting to get heel to ground. Discussion regarding bracing intervention and rationale   PATIENT EDUCATION: Education details: see above Person educated: Patient and Parent Education method: Customer service manager Education comprehension: verbalized understanding   HOME EXERCISE PROGRAM: TBD    GOALS: Goals reviewed with patient? Yes  SHORT TERM GOALS: Target date: 06/12/2022  Patient will be independent in HEP to improve functional  outcomes Baseline: Goal status: on-going  2.  Pt to be able to don/doff left AFO to improve functional independence and safety with transfers and gait Baseline: N/A Goal status: on-going    LONG TERM GOALS: Target date: 07/10/2022  Demonstrate improved safety and speed of ambulation per time 18 sec TUG test to reduce risk for falls Baseline: 26 sec Goal status: INITIAL  2.  Demonstrate improved efficiency of gait by ambulating with speed of 2.0 ft/sec for increased independence in community Baseline: 1.3 ft/sec Goal status: INITIAL   ASSESSMENT:  CLINICAL IMPRESSION: Difficulty with fast, alternating movements LLE and notable flexor tone also being a limiting factor. Attempted use of give-mohr sling for LUE for improved reciprocal arm swing but little effect noted. Continued with activities to improve motor control to LLE and improved stance stability and gross motor coordination to enhance safety with transfers and gait as well as flexibility to improve left ankle dorsiflexion ROM to prepare for orthotic intervention.    OBJECTIVE IMPAIRMENTS Abnormal gait, decreased activity tolerance, decreased balance, decreased coordination, decreased knowledge of condition, decreased knowledge of use of DME, difficulty walking, decreased ROM, decreased strength, impaired flexibility, and impaired tone.   ACTIVITY LIMITATIONS carrying, standing, stairs, transfers, and locomotion level  PARTICIPATION LIMITATIONS: cleaning, shopping, community activity, and yard work  PERSONAL FACTORS Time since onset of injury/illness/exacerbation and 1-2 comorbidities: DM, HTN, CKD  are also affecting patient's functional outcome.   REHAB POTENTIAL: Good  CLINICAL DECISION MAKING: Stable/uncomplicated  EVALUATION COMPLEXITY: Low  PLAN: PT FREQUENCY: 1x/week  PT DURATION: 8 weeks  PLANNED INTERVENTIONS: Therapeutic exercises, Therapeutic activity, Neuromuscular re-education, Balance training, Gait  training, Patient/Family education, Self Care, Joint mobilization, Stair training, Vestibular training, Canalith repositioning, Orthotic/Fit training, DME instructions, Aquatic Therapy, Dry Needling, Electrical stimulation, Wheelchair mobility training, Taping, and Manual therapy  PLAN FOR NEXT SESSION: continued trials with step length   1:17 PM, 06/05/22 M. Sherlyn Lees, PT, DPT Physical Therapist- [place]  Office Number: [phone]

## 2022-06-10 ENCOUNTER — Other Ambulatory Visit: Payer: Self-pay | Admitting: Family Medicine

## 2022-06-10 DIAGNOSIS — I129 Hypertensive chronic kidney disease with stage 1 through stage 4 chronic kidney disease, or unspecified chronic kidney disease: Secondary | ICD-10-CM

## 2022-06-11 NOTE — Telephone Encounter (Signed)
Requested Prescriptions  Pending Prescriptions Disp Refills   sodium bicarbonate 650 MG tablet [Pharmacy Med Name: Sodium Bicarbonate 650 MG Oral Tablet] 120 tablet 2    Sig: Take 2 tablets by mouth twice daily     Endocrinology:  Minerals 2 Failed - 06/10/2022  8:25 AM      Failed - Mg Level in normal range and within 180 days    Magnesium  Date Value Ref Range Status  06/11/2020 1.5 (L) 1.7 - 2.4 mg/dL Final    Comment:    Performed at [facility], [place] [address], [place], [place] [address]         Failed - BMP within normal limits in the last 6 months    Glucose, Bld  Date Value Ref Range Status  05/25/2022 195 (H) 70 - 99 mg/dL Final    Comment:    Glucose reference range applies only to samples taken after fasting for at least 8 hours.   POC Glucose  Date Value Ref Range Status  01/31/2022 132 (A) 70 - 99 mg/dl Final   Glucose-Capillary  Date Value Ref Range Status  06/14/2020 136 (H) 70 - 99 mg/dL Final    Comment:    Glucose reference range applies only to samples taken after fasting for at least 8 hours.   Calcium  Date Value Ref Range Status  05/25/2022 9.9 8.9 - 10.3 mg/dL Final   Calcium, Ion  Date Value Ref Range Status  04/16/2020 1.30 1.15 - 1.40 mmol/L Final   Sodium  Date Value Ref Range Status  05/25/2022 141 135 - 145 mmol/L Final  02/07/2022 140 134 - 144 mmol/L Final   Potassium  Date Value Ref Range Status  05/25/2022 3.7 3.5 - 5.1 mmol/L Final   Chloride  Date Value Ref Range Status  05/25/2022 104 98 - 111 mmol/L Final   BUN  Date Value Ref Range Status  05/25/2022 46 (H) 6 - 20 mg/dL Final  02/07/2022 47 (H) 6 - 24 mg/dL Final   Creatinine, Ser  Date Value Ref Range Status  05/25/2022 4.75 (H) 0.61 - 1.24 mg/dL Final   Creatinine, Urine  Date Value Ref Range Status  06/11/2020 87.72 mg/dL Final    Comment:    Performed at [place] [place] [address], [place], [place] [address]   CO2  Date Value Ref Range Status   05/25/2022 23 22 - 32 mmol/L Final   TCO2  Date Value Ref Range Status  04/16/2020 23 22 - 32 mmol/L Final   GFR calc Af Amer  Date Value Ref Range Status  06/07/2020 23 (L) >59 mL/min/1.73 Final    Comment:    **In accordance with recommendations from the NKF-ASN Task force,**   [facility] is in the process of updating its eGFR calculation to the   2021 CKD-EPI creatinine equation that estimates kidney function   without a race variable.    eGFR  Date Value Ref Range Status  02/07/2022 16 (L) >59 mL/min/1.73 Final   GFR, Estimated  Date Value Ref Range Status  05/25/2022 14 (L) >60 mL/min Final    Comment:    (NOTE) Calculated using the CKD-EPI Creatinine Equation (2021)          Passed - Valid encounter within last 12 months    Recent Outpatient Visits           4 months ago Type 2 diabetes mellitus with other specified complication, without long-term current use of insulin (  [place])   [place], [place], MD   5 months ago Hypertension in stage 4 chronic kidney disease due to type 2 diabetes mellitus [facility])   [place], [name] L, RPH-CPP   6 months ago Hypertension in stage 4 chronic kidney disease due to type 2 diabetes mellitus [facility])   [place], [name] L, RPH-CPP   6 months ago Hypertension in stage 4 chronic kidney disease due to type 2 diabetes mellitus [facility]   [place], Jarome [place]atin, RPH-CPP   7 months ago Type 2 diabetes mellitus with other specified complication, without long-term current use of insulin ([place])   [place], [place], MD       Future Appointments             In 1 month Charlott Rakes, MD [place]

## 2022-06-13 ENCOUNTER — Ambulatory Visit (HOSPITAL_BASED_OUTPATIENT_CLINIC_OR_DEPARTMENT_OTHER): Payer: Medicaid Other | Admitting: Physical Therapy

## 2022-06-14 NOTE — Therapy (Signed)
OUTPATIENT PHYSICAL THERAPY NEURO TREATMENT   Patient Name: Tony Schmitt MRN: [id number] DOB:1971/04/27, 51 y.o., male Today's Date: 06/15/2022   PCP: Charlott Rakes, MD REFERRING PROVIDER: Charlett Blake, MD    PT End of Session - 06/15/22 1120     Visit Number 5    Number of Visits 8    Date for PT Re-Evaluation 07/10/22    Authorization Type [place] Medicaid UHC    Authorization Time Period 66 VL    Authorization - Visit Number 3    Authorization - Number of Visits 27    Activity Tolerance Patient tolerated treatment well    Behavior During Therapy Flat affect             Past Medical History:  Diagnosis Date   Chronic kidney disease    Diabetes mellitus without complication ([place])    Hypertension    Stroke [facility])    Past Surgical History:  Procedure Laterality Date   BUBBLE STUDY  04/18/2020   Procedure: BUBBLE STUDY;  Surgeon: Sueanne Margarita, MD;  Location: [facility];  Service: Cardiovascular;;   TEE WITHOUT CARDIOVERSION N/A 04/18/2020   Procedure: TRANSESOPHAGEAL ECHOCARDIOGRAM (TEE);  Surgeon: Sueanne Margarita, MD;  Location: [facility] ENDOSCOPY;  Service: Cardiovascular;  Laterality: N/A;   Patient Active Problem List   Diagnosis Date Noted   Acquired left foot drop 05/22/2022   Nephrotic syndrome    Peripheral edema    Abnormality of gait 05/23/2020   Type 2 diabetes mellitus with hyperglycemia, without long-term current use of insulin (HCC)    Thrombocytosis    Benign essential HTN    Hypoalbuminemia due to protein-calorie malnutrition (HCC)    Acute blood loss anemia    Leukocytosis    Slow transit constipation    AKI (acute kidney injury) ([place])    Diabetes mellitus type 2 with complications, uncontrolled    CVA (cerebral vascular accident) ([place]) 04/22/2020   Acute left-sided weakness    Tobacco abuse    Essential hypertension    Acute CVA (cerebrovascular accident) ([place]) 04/16/2020   Hypertensive urgency 04/16/2020   Type 2 diabetes mellitus  with vascular disease ([place]) 04/16/2020   CKD (chronic kidney disease) stage 3, GFR 30-59 ml/min ([place]) 04/23/2019   Hyperlipidemia 04/18/2019   Dyslipidemia associated with type 2 diabetes mellitus ([place]) 08/29/2018   Hypertension associated with type 2 diabetes mellitus ([place]) 08/29/2018    ONSET DATE: 04/16/20  REFERRING DIAG: F84.037 (ICD-10-CM) - Acquired left foot drop G81.10 (ICD-10-CM) - Spastic hemiplegia, unspecified etiology, unspecified laterality   THERAPY DIAG:  Difficulty in walking, not elsewhere classified  Muscle weakness (generalized)  Unsteadiness on feet  Rationale for Evaluation and Treatment Rehabilitation  SUBJECTIVE:  SUBJECTIVE STATEMENT: Spoke with Pt and CG (mother) in regards to being ready for session at appointment time, allowing extra time to get here and ready.  They Vu  Pt accompanied by: family member (mother)  PERTINENT HISTORY: CKD, DM, HTN, stroke  PAIN:  Are you having pain? No  PRECAUTIONS: None  WEIGHT BEARING RESTRICTIONS No  FALLS: Has patient fallen in last 6 months? No  LIVING ENVIRONMENT: Lives with: lives with their family Lives in: House/apartment, one-level Stairs: Yes: External: 2 steps; none Has following equipment at home: Single point cane and hurrycane  PLOF: Independent with basic ADLs, Independent with household mobility with device, and uses manual wheelchair for community distances (someone pushes)  PATIENT GOALS be able to hold stuff in left hand  OBJECTIVE:   TODAY'S TREATMENT: 06/05/22 Activity Comments  Trial with Giv-Mohr Minimal effect  NU-step x 8 min For fast, alternating movement, therapist facilitating LLE extension  Foot on step RLE elevated on 8" step for forced 2x60 sec  Lunges 4x10 RLE on BOSU, therapist  facilitating movement and sustained hold x 3 sec  Gastroc stretch slantboard 2x2 min RLE supported by therapist 2x       TODAY'S TREATMENT: 05/29/22 Activity Comments  RLE on step 4x30 sec, 4x15 sec eyes closed For LLE forced weight bearing  LLE lunge on 6" step 4x10 Therapist guiding for sequence and ROM  Step length activities Facilitation of LLE stance phase control to enable knee flexion in stance phase and enhance RLE step length  Sidestepping x 2 min  At counter, cues for toes pointed straight  Gastroc stretch 2x2 min W/ slantboard, therapist facilitating left knee extension in order to promote plantigrade and inhibit plantarflexor tone       TODAY'S TREATMENT: 05/22/22 Activity Comments  Gait trials w/ left AFO  1) PLS AFO -10 meter walk test 24.66 sec=    -TUG test 22.88 sec 2) ground reaction: -10 meter walk: 22.4 sec -TUG test: 22.8 sec 3) PLS AFO w/ medial stay -TUG test: 19.62 sec -10 meter walk: 21.91 sec                    DIAGNOSTIC FINDINGS: imaging studies at time of CVA  COGNITION: Overall cognitive status: Within functional limits for tasks assessed   SENSATION: Light touch: Impaired  LUE/LLE  COORDINATION: Grossly impaired LUE/LLE  EDEMA:  Minimal in LE, Left hand   MUSCLE TONE: LLE: Flaccid   MUSCLE LENGTH: Left achilles tightness. Lacking 5-10 degrees for plantigrade. With seated soleus stretch (left foot met heads positioned edge of 2" step) lacking 1.5 inches for heel contact  DTRs:    POSTURE: weight shifts right  LOWER EXTREMITY ROM:     Active  Right Eval Left Eval  Hip flexion    Hip extension    Hip abduction    Hip adduction    Hip internal rotation    Hip external rotation    Knee flexion    Knee extension    Ankle dorsiflexion 15 -10  Ankle plantarflexion    Ankle inversion    Ankle eversion     (Blank rows = not tested)  LOWER EXTREMITY MMT:    MMT Right Eval Left Eval  Hip flexion 5 3-  Hip extension     Hip abduction (in sitting) 5 4-  Hip adduction (in sitting) 5 4  Hip internal rotation    Hip external rotation    Knee flexion 5 4-  Knee extension 5  4  Ankle dorsiflexion 5 0  Ankle plantarflexion 4 0  Ankle inversion 5 0  Ankle eversion 5 0  (Blank rows = not tested)  BED MOBILITY:  NT  TRANSFERS: Assistive device utilized: Single point cane  Sit to stand: Complete Independence Stand to sit: Complete Independence and Modified independence Chair to chair: Complete Independence and Modified independence Floor:  NT  RAMP:  NT  CURB:  Level of Assistance: SBA and CGA Assistive device utilized: Single point cane Curb Comments: must lead up with RLE and descend w/ LLE  STAIRS:  Level of Assistance: Modified independence  Stair Negotiation Technique: Step to Pattern with Single Rail on Right or cane  Number of Stairs: 4   Height of Stairs: 6  Comments: turns slightly sideways  GAIT: Gait pattern: decreased stride length, circumduction- Left, Left foot flat, abducted- Left, and poor foot clearance- Left Distance walked: 80 ft Assistive device utilized: Single point cane Level of assistance: Modified independence Comments: level surfaces  FUNCTIONAL TESTs:  5 times sit to stand: 13.90 sec Timed up and go (TUG): 26 sec w/ HUrrycane 10 meter walk test: 1.3 ft/sec Berg Balance Scale: 45/56  PATIENT SURVEYS:  NT  TODAY'S TREATMENT:  11/10 Aquatic Pt seen for aquatic therapy today.  Treatment took place in water 3.25-4.5 ft in depth at the [facility]. Temp of water was 92.  Pt entered/exited the pool via stairs using step to pattern with hand rail.  *Intro to setting *Walking forward back and side stepping in 3.6 ft then working way to 4.8. Initially using black barbell, progressed to 1 yellow hand buoy then to unsupported.  VC for step length, pacing and control. *hopping in 4.14f *Forward back and side stepping with cues for increased  speed *Stepping over water brick forward and back *stair tapping in 3.6 ft with unilateral ue support then trial without *step ups bottom step leading R/L x 7-8 ea with unilateral ue support  Pt requires the buoyancy and hydrostatic pressure of water for support, and to offload joints by unweighting joint load by at least 50 % in navel deep water and by at least 75-80% in chest to neck deep water.  Viscosity of the water is needed for resistance of strengthening. Water current perturbations provides challenge to standing balance requiring increased core activation.    PATIENT EDUCATION: Education details: see above Person educated: Patient and Parent Education method: ECustomer service managerEducation comprehension: verbalized understanding   HOME EXERCISE PROGRAM: TBD    GOALS: Goals reviewed with patient? Yes  SHORT TERM GOALS: Target date: 06/12/2022  Patient will be independent in HEP to improve functional outcomes Baseline: Goal status: on-going  2.  Pt to be able to don/doff left AFO to improve functional independence and safety with transfers and gait Baseline: N/A Goal status: on-going    LONG TERM GOALS: Target date: 07/10/2022  Demonstrate improved safety and speed of ambulation per time 18 sec TUG test to reduce risk for falls Baseline: 26 sec Goal status: INITIAL  2.  Demonstrate improved efficiency of gait by ambulating with speed of 2.0 ft/sec for increased independence in community Baseline: 1.3 ft/sec Goal status: INITIAL   ASSESSMENT:  CLINICAL IMPRESSION:  Pt (and CG) introduced to setting. Therapist assisting in water to assess balance and amb ability as well as safety. Pt acclimates very well, quickly to setting decreasing ue support to without with walking in all directions backward being most challenging.  He has multiple slight LOB which  he is able to recover from initially with min-cga to sup. His lle tone ever present but only minimally  limiting in setting.  He is a good candidate for aquatic therapy and will benefit from  using the properties of water to improve strength, sharpen reflexes, improve balance and gait. He is encouraged to get a pair of water shoes for safety and will require a CG present at ea session to assist with amb to and from changing room.  I did suggest to CG to get WC from front desk and wheel him here as it is at least 400 ft each way from parking lot to pool for safety.     OBJECTIVE IMPAIRMENTS Abnormal gait, decreased activity tolerance, decreased balance, decreased coordination, decreased knowledge of condition, decreased knowledge of use of DME, difficulty walking, decreased ROM, decreased strength, impaired flexibility, and impaired tone.   ACTIVITY LIMITATIONS carrying, standing, stairs, transfers, and locomotion level  PARTICIPATION LIMITATIONS: cleaning, shopping, community activity, and yard work  PERSONAL FACTORS Time since onset of injury/illness/exacerbation and 1-2 comorbidities: DM, HTN, CKD  are also affecting patient's functional outcome.   REHAB POTENTIAL: Good  CLINICAL DECISION MAKING: Stable/uncomplicated  EVALUATION COMPLEXITY: Low  PLAN: PT FREQUENCY: 1x/week  PT DURATION: 8 weeks  PLANNED INTERVENTIONS: Therapeutic exercises, Therapeutic activity, Neuromuscular re-education, Balance training, Gait training, Patient/Family education, Self Care, Joint mobilization, Stair training, Vestibular training, Canalith repositioning, Orthotic/Fit training, DME instructions, Aquatic Therapy, Dry Needling, Electrical stimulation, Wheelchair mobility training, Taping, and Manual therapy  PLAN FOR NEXT SESSION: continued trials with step length    Stanton Kidney Tharon Aquas) Yeiren [place]ton MPT 06/15/22 132p

## 2022-06-15 ENCOUNTER — Encounter (HOSPITAL_BASED_OUTPATIENT_CLINIC_OR_DEPARTMENT_OTHER): Payer: Self-pay | Admitting: Physical Therapy

## 2022-06-15 ENCOUNTER — Ambulatory Visit (HOSPITAL_BASED_OUTPATIENT_CLINIC_OR_DEPARTMENT_OTHER): Payer: Medicaid Other | Attending: Physical Medicine & Rehabilitation | Admitting: Physical Therapy

## 2022-06-15 DIAGNOSIS — M6281 Muscle weakness (generalized): Secondary | ICD-10-CM | POA: Diagnosis present

## 2022-06-15 DIAGNOSIS — R262 Difficulty in walking, not elsewhere classified: Secondary | ICD-10-CM | POA: Insufficient documentation

## 2022-06-15 DIAGNOSIS — R2681 Unsteadiness on feet: Secondary | ICD-10-CM | POA: Diagnosis present

## 2022-06-17 IMAGING — DX DG CHEST 1V PORT
1 series · 1 of 1 positions shown · non-contrast
Comparison: Chest x-ray 08/02/2018.

CLINICAL DATA: Leg swelling.

EXAM:
PORTABLE CHEST 1 VIEW

[chest ap]
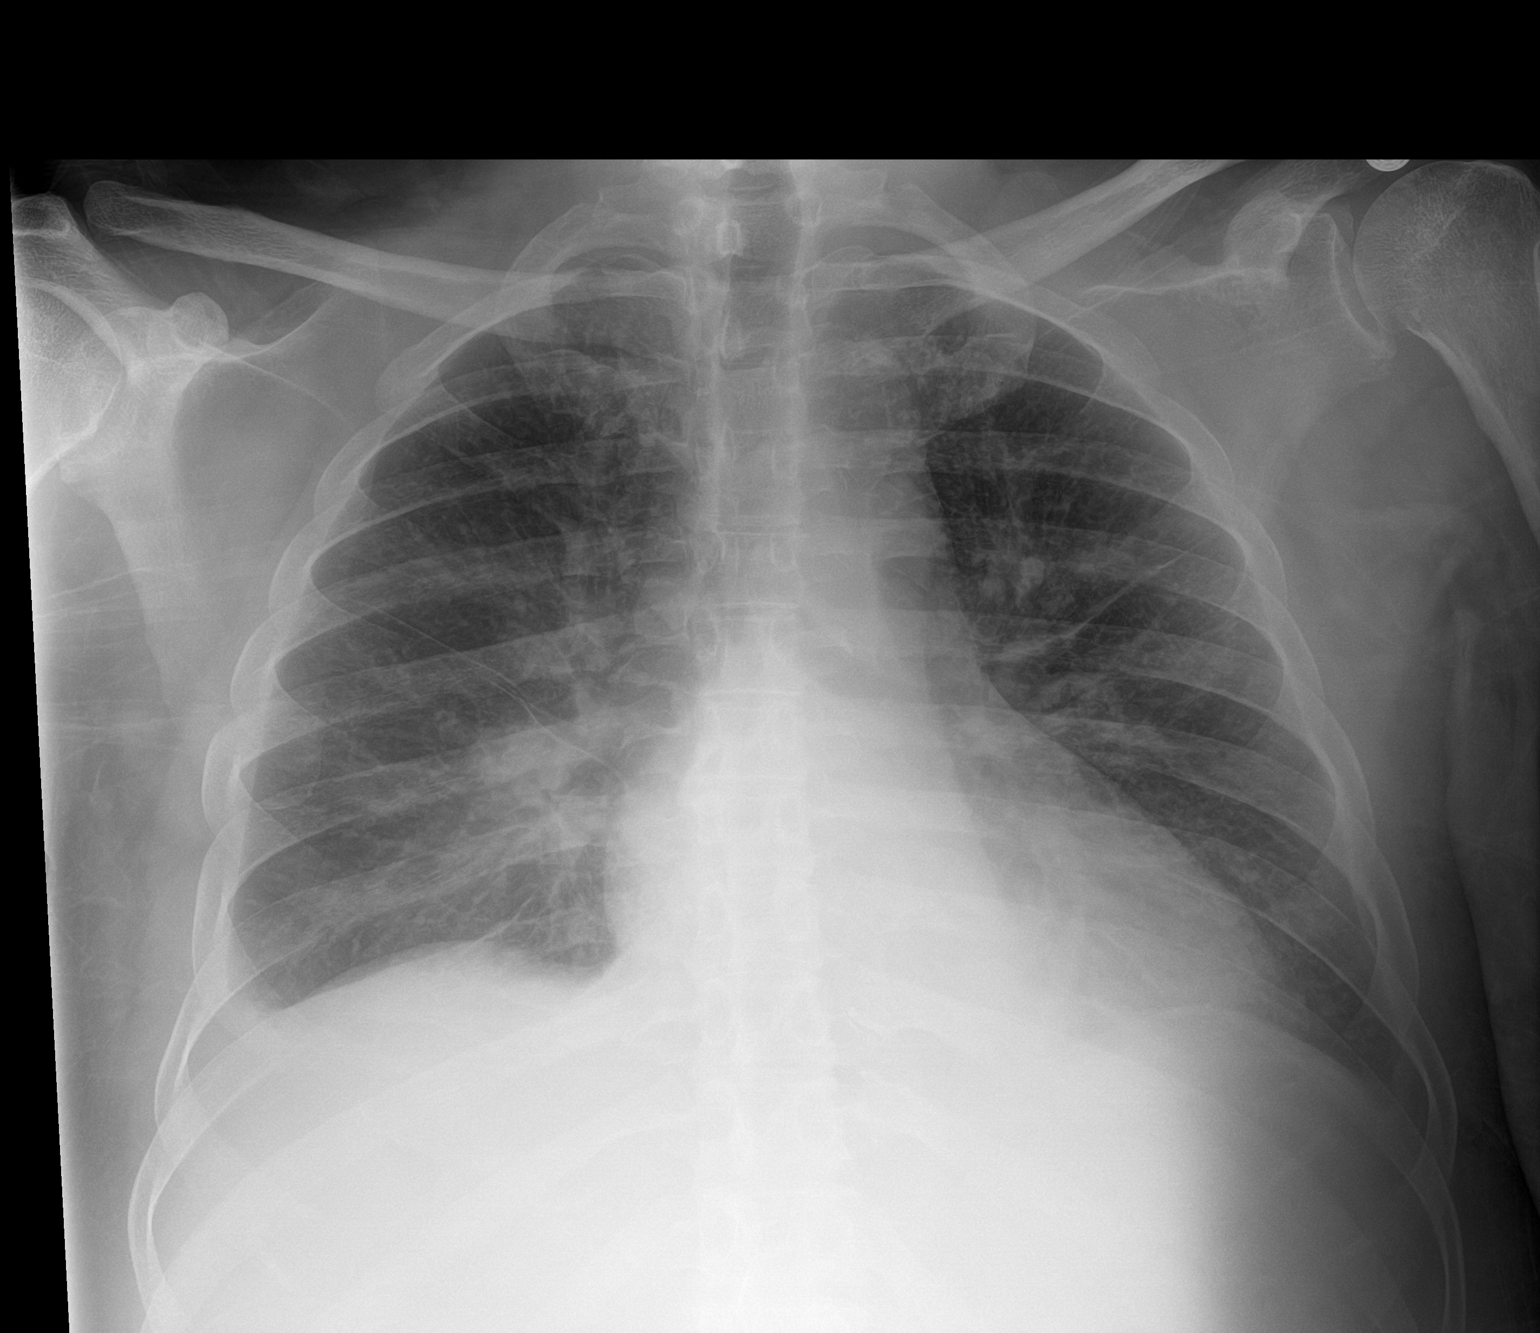

[1 of 1 positions shown; findings below may reference images not displayed]

FINDINGS: Cardiomegaly with bilateral perihilar interstitial prominence and
small bilateral pleural effusions. Findings suggest CHF. Pneumonitis
cannot be excluded. Low lung volumes. No pneumothorax.
IMPRESSION: Cardiomegaly with bilateral perihilar interstitial prominence and
small bilateral pleural effusions. Findings suggest CHF. Pneumonitis
cannot be excluded. Low lung volumes.

## 2022-06-19 ENCOUNTER — Ambulatory Visit (INDEPENDENT_AMBULATORY_CARE_PROVIDER_SITE_OTHER): Payer: Medicaid Other | Admitting: Podiatry

## 2022-06-19 ENCOUNTER — Encounter: Payer: Self-pay | Admitting: Podiatry

## 2022-06-19 ENCOUNTER — Ambulatory Visit: Payer: Medicaid Other | Admitting: Podiatry

## 2022-06-19 DIAGNOSIS — N183 Chronic kidney disease, stage 3 unspecified: Secondary | ICD-10-CM | POA: Diagnosis not present

## 2022-06-19 DIAGNOSIS — E1165 Type 2 diabetes mellitus with hyperglycemia: Secondary | ICD-10-CM

## 2022-06-19 DIAGNOSIS — B351 Tinea unguium: Secondary | ICD-10-CM

## 2022-06-19 NOTE — Progress Notes (Signed)
This patient returns to my office for at risk foot care.  This patient requires this care by a professional since this patient will be at risk due to having CKD and diabetes and CVA..  This patient is unable to cut nails himself since the patient cannot reach his nails.These nails are painful walking and wearing shoes.  This patient presents for at risk foot care today.  General Appearance  Alert, conversant and in no acute stress.  Vascular  Dorsalis pedis and posterior tibial  pulses are palpable  bilaterally.  Capillary return is within normal limits  bilaterally. Temperature is within normal limits  bilaterally.  Neurologic  Senn-Weinstein monofilament wire test within normal limits  bilaterally. Muscle power within normal limits bilaterally.  Nails Thick disfigured discolored nails with subungual debris  from hallux to fifth toes bilaterally. No evidence of bacterial infection or drainage bilaterally.  Orthopedic  No limitations of motion  feet .  No crepitus or effusions noted.  No bony pathology or digital deformities noted.  Skin  dry scaly skin with no porokeratosis noted bilaterally.  No signs of infections or ulcers noted.     Onychomycosis  Pain in right toes  Pain in left toes  Consent was obtained for treatment procedures.   Mechanical debridement of nails 1-5  bilaterally performed with a nail nipper.  Filed with dremel without incident.    Return office visit   3 months                   Told patient to return for periodic foot care and evaluation due to potential at risk complications.   Raquell Richer DPM   

## 2022-06-21 ENCOUNTER — Ambulatory Visit: Payer: Medicaid Other | Attending: Physical Medicine & Rehabilitation

## 2022-06-21 DIAGNOSIS — R2681 Unsteadiness on feet: Secondary | ICD-10-CM | POA: Insufficient documentation

## 2022-06-21 DIAGNOSIS — R262 Difficulty in walking, not elsewhere classified: Secondary | ICD-10-CM | POA: Diagnosis not present

## 2022-06-21 DIAGNOSIS — M6281 Muscle weakness (generalized): Secondary | ICD-10-CM | POA: Diagnosis present

## 2022-06-21 NOTE — Therapy (Signed)
OUTPATIENT PHYSICAL THERAPY NEURO TREATMENT   Patient Name: Tony Schmitt MRN: [id number] DOB:Jun 04, 1971, 51 y.o., male Today's Date: 06/21/2022   PCP: Charlott Rakes, MD REFERRING PROVIDER: Charlett Blake, MD    PT End of Session - 06/21/22 1148     Visit Number 6    Number of Visits 8    Date for PT Re-Evaluation 07/10/22    Authorization Type [place] Medicaid UHC    Authorization Time Period 27 VL    Authorization - Visit Number 6    Authorization - Number of Visits 27    PT Start Time 3354    PT Stop Time 1230    PT Time Calculation (min) 45 min    Activity Tolerance Patient tolerated treatment well    Behavior During Therapy Flat affect             Past Medical History:  Diagnosis Date   Chronic kidney disease    Diabetes mellitus without complication ([place])    Hypertension    Stroke [facility])    Past Surgical History:  Procedure Laterality Date   BUBBLE STUDY  04/18/2020   Procedure: BUBBLE STUDY;  Surgeon: Sueanne Margarita, MD;  Location: [facility];  Service: Cardiovascular;;   TEE WITHOUT CARDIOVERSION N/A 04/18/2020   Procedure: TRANSESOPHAGEAL ECHOCARDIOGRAM (TEE);  Surgeon: Sueanne Margarita, MD;  Location: [facility] ENDOSCOPY;  Service: Cardiovascular;  Laterality: N/A;   Patient Active Problem List   Diagnosis Date Noted   Acquired left foot drop 05/22/2022   Nephrotic syndrome    Peripheral edema    Abnormality of gait 05/23/2020   Type 2 diabetes mellitus with hyperglycemia, without long-term current use of insulin (HCC)    Thrombocytosis    Benign essential HTN    Hypoalbuminemia due to protein-calorie malnutrition (HCC)    Acute blood loss anemia    Leukocytosis    Slow transit constipation    AKI (acute kidney injury) ([place])    Diabetes mellitus type 2 with complications, uncontrolled    CVA (cerebral vascular accident) ([place]) 04/22/2020   Acute left-sided weakness    Tobacco abuse    Essential hypertension    Acute CVA (cerebrovascular accident)  ([place]) 04/16/2020   Hypertensive urgency 04/16/2020   Type 2 diabetes mellitus with vascular disease ([place]) 04/16/2020   CKD (chronic kidney disease) stage 3, GFR 30-59 ml/min ([place]) 04/23/2019   Hyperlipidemia 04/18/2019   Dyslipidemia associated with type 2 diabetes mellitus ([place]) 08/29/2018   Hypertension associated with type 2 diabetes mellitus ([place]) 08/29/2018    ONSET DATE: 04/16/20  REFERRING DIAG: T62.563 (ICD-10-CM) - Acquired left foot drop G81.10 (ICD-10-CM) - Spastic hemiplegia, unspecified etiology, unspecified laterality   THERAPY DIAG:  Difficulty in walking, not elsewhere classified  Muscle weakness (generalized)  Unsteadiness on feet  Rationale for Evaluation and Treatment Rehabilitation  SUBJECTIVE:  SUBJECTIVE STATEMENT: Aquatic therapy went really well, meeting with insurance regarding brace tomorrow.  Pt accompanied by: family member (mother)  PERTINENT HISTORY: CKD, DM, HTN, stroke  PAIN:  Are you having pain? No  PRECAUTIONS: None  WEIGHT BEARING RESTRICTIONS No  FALLS: Has patient fallen in last 6 months? No  LIVING ENVIRONMENT: Lives with: lives with their family Lives in: House/apartment, one-level Stairs: Yes: External: 2 steps; none Has following equipment at home: Single point cane and hurrycane  PLOF: Independent with basic ADLs, Independent with household mobility with device, and uses manual wheelchair for community distances (someone pushes)  PATIENT GOALS be able to hold stuff in left hand  OBJECTIVE:   TODAY'S TREATMENT: 06/21/22 Activity Comments  Sidestepping x 2 min assist bar   Foot on step RLE elevated on 8" step for forced 2x60 sec    Standing balance On firm/foam: EO/EC 2x30 sec, head turns 5x EO/EC  Gastroc stretch x 2 min On  slantboard to improve left achilles length for plantigrade  BOSU lunge 2x10 right/left therapist facilitating LLE position to improve ROM and mechanical advantage to prepare for gait with brace  Sit-stand 5x5 Stride stance  Pt education Visualization/explanation of hook-grip assist devices for LUE to use in gym environment for holding handles/equipment      DIAGNOSTIC FINDINGS: imaging studies at time of CVA  COGNITION: Overall cognitive status: Within functional limits for tasks assessed   SENSATION: Light touch: Impaired  LUE/LLE  COORDINATION: Grossly impaired LUE/LLE  EDEMA:  Minimal in LE, Left hand   MUSCLE TONE: LLE: Flaccid   MUSCLE LENGTH: Left achilles tightness. Lacking 5-10 degrees for plantigrade. With seated soleus stretch (left foot met heads positioned edge of 2" step) lacking 1.5 inches for heel contact  DTRs:    POSTURE: weight shifts right  LOWER EXTREMITY ROM:     Active  Right Eval Left Eval  Hip flexion    Hip extension    Hip abduction    Hip adduction    Hip internal rotation    Hip external rotation    Knee flexion    Knee extension    Ankle dorsiflexion 15 -10  Ankle plantarflexion    Ankle inversion    Ankle eversion     (Blank rows = not tested)  LOWER EXTREMITY MMT:    MMT Right Eval Left Eval  Hip flexion 5 3-  Hip extension    Hip abduction (in sitting) 5 4-  Hip adduction (in sitting) 5 4  Hip internal rotation    Hip external rotation    Knee flexion 5 4-  Knee extension 5 4  Ankle dorsiflexion 5 0  Ankle plantarflexion 4 0  Ankle inversion 5 0  Ankle eversion 5 0  (Blank rows = not tested)  BED MOBILITY:  NT  TRANSFERS: Assistive device utilized: Single point cane  Sit to stand: Complete Independence Stand to sit: Complete Independence and Modified independence Chair to chair: Complete Independence and Modified independence Floor:  NT  RAMP:  NT  CURB:  Level of Assistance: SBA and CGA Assistive  device utilized: Single point cane Curb Comments: must lead up with RLE and descend w/ LLE  STAIRS:  Level of Assistance: Modified independence  Stair Negotiation Technique: Step to Pattern with Single Rail on Right or cane  Number of Stairs: 4   Height of Stairs: 6  Comments: turns slightly sideways  GAIT: Gait pattern: decreased stride length, circumduction- Left, Left foot flat, abducted- Left, and poor foot  clearance- Left Distance walked: 80 ft Assistive device utilized: Single point cane Level of assistance: Modified independence Comments: level surfaces  FUNCTIONAL TESTs:  5 times sit to stand: 13.90 sec Timed up and go (TUG): 26 sec w/ HUrrycane 10 meter walk test: 1.3 ft/sec Berg Balance Scale: 45/56  PATIENT SURVEYS:  NT  TODAY'S TREATMENT:  11/10 Aquatic Pt seen for aquatic therapy today.  Treatment took place in water 3.25-4.5 ft in depth at the [facility]. Temp of water was 92.  Pt entered/exited the pool via stairs using step to pattern with hand rail.  *Intro to setting *Walking forward back and side stepping in 3.6 ft then working way to 4.8. Initially using black barbell, progressed to 1 yellow hand buoy then to unsupported.  VC for step length, pacing and control. *hopping in 4.44f *Forward back and side stepping with cues for increased speed *Stepping over water brick forward and back *stair tapping in 3.6 ft with unilateral ue support then trial without *step ups bottom step leading R/L x 7-8 ea with unilateral ue support  Pt requires the buoyancy and hydrostatic pressure of water for support, and to offload joints by unweighting joint load by at least 50 % in navel deep water and by at least 75-80% in chest to neck deep water.  Viscosity of the water is needed for resistance of strengthening. Water current perturbations provides challenge to standing balance requiring increased core activation.    PATIENT EDUCATION: Education details:  see above Person educated: Patient and Parent Education method: ECustomer service managerEducation comprehension: verbalized understanding   HOME EXERCISE PROGRAM: TBD    GOALS: Goals reviewed with patient? Yes  SHORT TERM GOALS: Target date: 06/12/2022  Patient will be independent in HEP to improve functional outcomes Baseline: Goal status: on-going  2.  Pt to be able to don/doff left AFO to improve functional independence and safety with transfers and gait Baseline: N/A Goal status: on-going    LONG TERM GOALS: Target date: 07/10/2022  Demonstrate improved safety and speed of ambulation per time 18 sec TUG test to reduce risk for falls Baseline: 26 sec Goal status: INITIAL  2.  Demonstrate improved efficiency of gait by ambulating with speed of 2.0 ft/sec for increased independence in community Baseline: 1.3 ft/sec Goal status: INITIAL   ASSESSMENT:  CLINICAL IMPRESSION: Continued with session's focus on LLE motor control as well as strength to improve stance phase control for safety in gait activities. Facilitation of LLE mechanics needed due to weakness and limited gastroc flexibility causing compensatory posture/position the therapist manual contact/blocking needed to facilitate proper mechanics.  Initiated stride stance to bias LLE in sit to stand also requiring manual facilitation for engagement of left quad with some instances of lateral instability to the right due to unfamiliar position. Pt has meeting with insurance company to seek approval for brace recommendation. A left AFO is highly recommended as device would improve safety during transfers and gait and reduce energy demands of ambulation.      OBJECTIVE IMPAIRMENTS Abnormal gait, decreased activity tolerance, decreased balance, decreased coordination, decreased knowledge of condition, decreased knowledge of use of DME, difficulty walking, decreased ROM, decreased strength, impaired flexibility, and  impaired tone.   ACTIVITY LIMITATIONS carrying, standing, stairs, transfers, and locomotion level  PARTICIPATION LIMITATIONS: cleaning, shopping, community activity, and yard work  PERSONAL FACTORS Time since onset of injury/illness/exacerbation and 1-2 comorbidities: DM, HTN, CKD  are also affecting patient's functional outcome.   REHAB POTENTIAL: Good  CLINICAL  DECISION MAKING: Stable/uncomplicated  EVALUATION COMPLEXITY: Low  PLAN: PT FREQUENCY: 1x/week  PT DURATION: 8 weeks  PLANNED INTERVENTIONS: Therapeutic exercises, Therapeutic activity, Neuromuscular re-education, Balance training, Gait training, Patient/Family education, Self Care, Joint mobilization, Stair training, Vestibular training, Canalith repositioning, Orthotic/Fit training, DME instructions, Aquatic Therapy, Dry Needling, Electrical stimulation, Wheelchair mobility training, Taping, and Manual therapy  PLAN FOR NEXT SESSION: continued trials with step length    11:50 AM, 06/21/22 M. Sherlyn Lees, PT, DPT Physical Therapist- [place] Office Number: [phone]

## 2022-06-22 ENCOUNTER — Ambulatory Visit (HOSPITAL_COMMUNITY)
Admission: RE | Admit: 2022-06-22 | Discharge: 2022-06-22 | Disposition: A | Discharge status: Home / Self Care | Payer: Medicaid Other | Source: Ambulatory Visit | Attending: Nephrology | Admitting: Nephrology

## 2022-06-22 VITALS — BP 170/84 | HR 76 | Temp 99.5°F | Resp 18

## 2022-06-22 DIAGNOSIS — N183 Chronic kidney disease, stage 3 unspecified: Secondary | ICD-10-CM | POA: Diagnosis present

## 2022-06-22 LAB — CBC WITH DIFFERENTIAL/PLATELET
Abs Immature Granulocytes: 0.05 10*3/uL (ref 0.00–0.07)
Basophils Absolute: 0.1 10*3/uL (ref 0.0–0.1)
Basophils Relative: 1 %
Eosinophils Absolute: 0.2 10*3/uL (ref 0.0–0.5)
Eosinophils Relative: 3 %
HCT: 31.5 % — ABNORMAL LOW (ref 39.0–52.0)
Hemoglobin: 10.2 g/dL — ABNORMAL LOW (ref 13.0–17.0)
Immature Granulocytes: 1 %
Lymphocytes Relative: 24 %
Lymphs Abs: 2.3 10*3/uL (ref 0.7–4.0)
MCH: 26.8 pg (ref 26.0–34.0)
MCHC: 32.4 g/dL (ref 30.0–36.0)
MCV: 82.7 fL (ref 80.0–100.0)
Monocytes Absolute: 0.5 10*3/uL (ref 0.1–1.0)
Monocytes Relative: 5 %
Neutro Abs: 6.2 10*3/uL (ref 1.7–7.7)
Neutrophils Relative %: 66 %
Platelets: 330 10*3/uL (ref 150–400)
RBC: 3.81 MIL/uL — ABNORMAL LOW (ref 4.22–5.81)
RDW: 15.3 % (ref 11.5–15.5)
WBC: 9.3 10*3/uL (ref 4.0–10.5)
nRBC: 0 % (ref 0.0–0.2)

## 2022-06-22 LAB — RENAL FUNCTION PANEL
Albumin: 3.3 g/dL — ABNORMAL LOW (ref 3.5–5.0)
Anion gap: 8 (ref 5–15)
BUN: 38 mg/dL — ABNORMAL HIGH (ref 6–20)
CO2: 23 mmol/L (ref 22–32)
Calcium: 9.9 mg/dL (ref 8.9–10.3)
Chloride: 111 mmol/L (ref 98–111)
Creatinine, Ser: 3.82 mg/dL — ABNORMAL HIGH (ref 0.61–1.24)
GFR, Estimated: 18 mL/min — ABNORMAL LOW (ref >60)
Glucose, Bld: 107 mg/dL — ABNORMAL HIGH (ref 70–99)
Phosphorus: 2.3 mg/dL — ABNORMAL LOW (ref 2.5–4.6)
Potassium: 3.4 mmol/L — ABNORMAL LOW (ref 3.5–5.1)
Sodium: 142 mmol/L (ref 135–145)

## 2022-06-22 LAB — FERRITIN: Ferritin: 384 ng/mL — ABNORMAL HIGH (ref 24–336)

## 2022-06-22 LAB — IRON AND TIBC
Iron: 62 ug/dL (ref 45–182)
Saturation Ratios: 29 % (ref 17.9–39.5)
TIBC: 214 ug/dL — ABNORMAL LOW (ref 250–450)
UIBC: 152 ug/dL

## 2022-06-22 LAB — POCT HEMOGLOBIN-HEMACUE: Hemoglobin: 10.4 g/dL — ABNORMAL LOW (ref 13.0–17.0)

## 2022-06-22 MED ORDER — EPOETIN ALFA-EPBX 10000 UNIT/ML IJ SOLN
20000.0000 [IU] | INTRAMUSCULAR | Status: DC
  Administered 2022-06-22: 20000 [IU] via SUBCUTANEOUS

## 2022-06-22 MED ORDER — EPOETIN ALFA-EPBX 10000 UNIT/ML IJ SOLN
INTRAMUSCULAR | Status: AC
  Filled 2022-06-22: qty 2

## 2022-06-26 DIAGNOSIS — N2581 Secondary hyperparathyroidism of renal origin: Secondary | ICD-10-CM | POA: Diagnosis not present

## 2022-06-26 DIAGNOSIS — E1122 Type 2 diabetes mellitus with diabetic chronic kidney disease: Secondary | ICD-10-CM | POA: Diagnosis not present

## 2022-06-26 DIAGNOSIS — I120 Hypertensive chronic kidney disease with stage 5 chronic kidney disease or end stage renal disease: Secondary | ICD-10-CM | POA: Diagnosis not present

## 2022-06-26 DIAGNOSIS — N185 Chronic kidney disease, stage 5: Secondary | ICD-10-CM | POA: Diagnosis not present

## 2022-06-26 DIAGNOSIS — I639 Cerebral infarction, unspecified: Secondary | ICD-10-CM | POA: Diagnosis not present

## 2022-06-26 DIAGNOSIS — D631 Anemia in chronic kidney disease: Secondary | ICD-10-CM | POA: Diagnosis not present

## 2022-06-26 NOTE — Therapy (Signed)
OUTPATIENT PHYSICAL THERAPY NEURO TREATMENT   Patient Name: Tony Schmitt MRN: [id number] DOB:[birth year], 51 y.o., male Today's Date: 06/21/2022   PCP: Charlott Rakes, MD REFERRING PROVIDER: Charlett Blake, MD    PT End of Session - 06/21/22 1148     Visit Number 6    Number of Visits 8    Date for PT Re-Evaluation 07/10/22    Authorization Type [place] Medicaid UHC    Authorization Time Period 27 VL    Authorization - Visit Number 6    Authorization - Number of Visits 27    PT Start Time 8867    PT Stop Time 1230    PT Time Calculation (min) 45 min    Activity Tolerance Patient tolerated treatment well    Behavior During Therapy Flat affect             Past Medical History:  Diagnosis Date   Chronic kidney disease    Diabetes mellitus without complication ([place])    Hypertension    Stroke [facility])    Past Surgical History:  Procedure Laterality Date   BUBBLE STUDY  04/18/2020   Procedure: BUBBLE STUDY;  Surgeon: Sueanne Margarita, MD;  Location: [place];  Service: Cardiovascular;;   TEE WITHOUT CARDIOVERSION N/A 04/18/2020   Procedure: TRANSESOPHAGEAL ECHOCARDIOGRAM (TEE);  Surgeon: Sueanne Margarita, MD;  Location: [facility] ENDOSCOPY;  Service: Cardiovascular;  Laterality: N/A;   Patient Active Problem List   Diagnosis Date Noted   Acquired left foot drop 05/22/2022   Nephrotic syndrome    Peripheral edema    Abnormality of gait 05/23/2020   Type 2 diabetes mellitus with hyperglycemia, without long-term current use of insulin (HCC)    Thrombocytosis    Benign essential HTN    Hypoalbuminemia due to protein-calorie malnutrition (HCC)    Acute blood loss anemia    Leukocytosis    Slow transit constipation    AKI (acute kidney injury) ([place])    Diabetes mellitus type 2 with complications, uncontrolled    CVA (cerebral vascular accident) ([place]) 04/22/2020   Acute left-sided weakness    Tobacco abuse    Essential hypertension    Acute CVA (cerebrovascular accident)  ([place]) 04/16/2020   Hypertensive urgency 04/16/2020   Type 2 diabetes mellitus with vascular disease ([place]) 04/16/2020   CKD (chronic kidney disease) stage 3, GFR 30-59 ml/min ([place]) 04/23/2019   Hyperlipidemia 04/18/2019   Dyslipidemia associated with type 2 diabetes mellitus ([place]) 08/29/2018   Hypertension associated with type 2 diabetes mellitus ([place]) 08/29/2018    ONSET DATE: 04/16/20  REFERRING DIAG: R37.366 (ICD-10-CM) - Acquired left foot drop G81.10 (ICD-10-CM) - Spastic hemiplegia, unspecified etiology, unspecified laterality   THERAPY DIAG:  Difficulty in walking, not elsewhere classified  Muscle weakness (generalized)  Unsteadiness on feet  Rationale for Evaluation and Treatment Rehabilitation  SUBJECTIVE:  SUBJECTIVE STATEMENT: I got membership at [employer] after last session I liked it so much  Pt accompanied by: family member (mother)  PERTINENT HISTORY: CKD, DM, HTN, stroke  PAIN:  Are you having pain? No  PRECAUTIONS: None  WEIGHT BEARING RESTRICTIONS No  FALLS: Has patient fallen in last 6 months? No  LIVING ENVIRONMENT: Lives with: lives with their family Lives in: House/apartment, one-level Stairs: Yes: External: 2 steps; none Has following equipment at home: Single point cane and hurrycane  PLOF: Independent with basic ADLs, Independent with household mobility with device, and uses manual wheelchair for community distances (someone pushes)  PATIENT GOALS be able to hold stuff in left hand  OBJECTIVE:   TODAY'S TREATMENT:   11/22 Aquatic Pt seen for aquatic therapy today.  Treatment took place in water 3.25-4.5 ft in depth at the [facility]. Temp of water was 92.  Pt entered/exited the pool via stairs using step to pattern with hand  rail.   *Walking forward back and side stepping in 3.6 ft then working way to 4.8. Initially using black barbell, progressed to 1 yellow hand buoy then to unsupported.  VC for step length, pacing and control. *3 way tap beginning L then R x 3 ea then x 8 alternating rue support on yellow hand buoy *hip flex/ext R/L x10 ue support on wall *stepping up then down from  water step leading lle bith ways.  Cues for weight shift and immediate standing balance completed in 3.65f *STS from bench onto water step x 10. Cues for immediate standing balance. *stair tapping in 3.6 ft without ue support   Pt requires the buoyancy and hydrostatic pressure of water for support, and to offload joints by unweighting joint load by at least 50 % in navel deep water and by at least 75-80% in chest to neck deep water.  Viscosity of the water is needed for resistance of strengthening. Water current perturbations provides challenge to standing balance requiring increased core activation.     06/21/22 Activity Comments  Sidestepping x 2 min assist bar   Foot on step RLE elevated on 8" step for forced 2x60 sec    Standing balance On firm/foam: EO/EC 2x30 sec, head turns 5x EO/EC  Gastroc stretch x 2 min On slantboard to improve left achilles length for plantigrade  BOSU lunge 2x10 right/left therapist facilitating LLE position to improve ROM and mechanical advantage to prepare for gait with brace  Sit-stand 5x5 Stride stance  Pt education Visualization/explanation of hook-grip assist devices for LUE to use in gym environment for holding handles/equipment      DIAGNOSTIC FINDINGS: imaging studies at time of CVA  COGNITION: Overall cognitive status: Within functional limits for tasks assessed   SENSATION: Light touch: Impaired  LUE/LLE  COORDINATION: Grossly impaired LUE/LLE  EDEMA:  Minimal in LE, Left hand   MUSCLE TONE: LLE: Flaccid   MUSCLE LENGTH: Left achilles tightness. Lacking 5-10 degrees  for plantigrade. With seated soleus stretch (left foot met heads positioned edge of 2" step) lacking 1.5 inches for heel contact  DTRs:    POSTURE: weight shifts right  LOWER EXTREMITY ROM:     Active  Right Eval Left Eval  Hip flexion    Hip extension    Hip abduction    Hip adduction    Hip internal rotation    Hip external rotation    Knee flexion    Knee extension    Ankle dorsiflexion 15 -10  Ankle plantarflexion  Ankle inversion    Ankle eversion     (Blank rows = not tested)  LOWER EXTREMITY MMT:    MMT Right Eval Left Eval  Hip flexion 5 3-  Hip extension    Hip abduction (in sitting) 5 4-  Hip adduction (in sitting) 5 4  Hip internal rotation    Hip external rotation    Knee flexion 5 4-  Knee extension 5 4  Ankle dorsiflexion 5 0  Ankle plantarflexion 4 0  Ankle inversion 5 0  Ankle eversion 5 0  (Blank rows = not tested)  BED MOBILITY:  NT  TRANSFERS: Assistive device utilized: Single point cane  Sit to stand: Complete Independence Stand to sit: Complete Independence and Modified independence Chair to chair: Complete Independence and Modified independence Floor:  NT  RAMP:  NT  CURB:  Level of Assistance: SBA and CGA Assistive device utilized: Single point cane Curb Comments: must lead up with RLE and descend w/ LLE  STAIRS:  Level of Assistance: Modified independence  Stair Negotiation Technique: Step to Pattern with Single Rail on Right or cane  Number of Stairs: 4   Height of Stairs: 6  Comments: turns slightly sideways  GAIT: Gait pattern: decreased stride length, circumduction- Left, Left foot flat, abducted- Left, and poor foot clearance- Left Distance walked: 80 ft Assistive device utilized: Single point cane Level of assistance: Modified independence Comments: level surfaces  FUNCTIONAL TESTs:  5 times sit to stand: 13.90 sec Timed up and go (TUG): 26 sec w/ HUrrycane 10 meter walk test: 1.3 ft/sec Berg Balance  Scale: 45/56  PATIENT SURVEYS:  NT    PATIENT EDUCATION: Education details: see above Person educated: Patient and Parent Education method: Customer service manager Education comprehension: verbalized understanding   HOME EXERCISE PROGRAM: TBD    GOALS: Goals reviewed with patient? Yes  SHORT TERM GOALS: Target date: 06/12/2022  Patient will be independent in HEP to improve functional outcomes Baseline: Goal status: on-going  2.  Pt to be able to don/doff left AFO to improve functional independence and safety with transfers and gait Baseline: N/A Goal status: on-going    LONG TERM GOALS: Target date: 07/10/2022  Demonstrate improved safety and speed of ambulation per time 18 sec TUG test to reduce risk for falls Baseline: 26 sec Goal status: INITIAL  2.  Demonstrate improved efficiency of gait by ambulating with speed of 2.0 ft/sec for increased independence in community Baseline: 1.3 ft/sec Goal status: INITIAL   ASSESSMENT:  CLINICAL IMPRESSION: Progressed balance and coordination challenges. He acclimates quickly to submersion maintaining upright positioning and manuevering through all depths without assist, therapist instructing from deck. Able to support RUE with yellow hand buoy added 1/2 way through session which improves his posture with amb. L quad and hamstring tone as session progresses improving knee flex with movement. He enjoys session and tolerates without recovery periods.     OBJECTIVE IMPAIRMENTS Abnormal gait, decreased activity tolerance, decreased balance, decreased coordination, decreased knowledge of condition, decreased knowledge of use of DME, difficulty walking, decreased ROM, decreased strength, impaired flexibility, and impaired tone.   ACTIVITY LIMITATIONS carrying, standing, stairs, transfers, and locomotion level  PARTICIPATION LIMITATIONS: cleaning, shopping, community activity, and yard work  PERSONAL FACTORS Time since  onset of injury/illness/exacerbation and 1-2 comorbidities: DM, HTN, CKD  are also affecting patient's functional outcome.   REHAB POTENTIAL: Good  CLINICAL DECISION MAKING: Stable/uncomplicated  EVALUATION COMPLEXITY: Low  PLAN: PT FREQUENCY: 1x/week  PT DURATION: 8 weeks  PLANNED  INTERVENTIONS: Therapeutic exercises, Therapeutic activity, Neuromuscular re-education, Balance training, Gait training, Patient/Family education, Self Care, Joint mobilization, Stair training, Vestibular training, Canalith repositioning, Orthotic/Fit training, DME instructions, Aquatic Therapy, Dry Needling, Electrical stimulation, Wheelchair mobility training, Taping, and Manual therapy  PLAN FOR NEXT SESSION: continued trials with step length    11:50 AM, 06/21/22 Annamarie Major) [place] MPT

## 2022-06-27 ENCOUNTER — Ambulatory Visit (HOSPITAL_BASED_OUTPATIENT_CLINIC_OR_DEPARTMENT_OTHER): Payer: Medicaid Other | Admitting: Physical Therapy

## 2022-06-27 ENCOUNTER — Encounter (HOSPITAL_BASED_OUTPATIENT_CLINIC_OR_DEPARTMENT_OTHER): Payer: Self-pay | Admitting: Physical Therapy

## 2022-06-27 DIAGNOSIS — R262 Difficulty in walking, not elsewhere classified: Secondary | ICD-10-CM | POA: Diagnosis not present

## 2022-06-27 DIAGNOSIS — R2681 Unsteadiness on feet: Secondary | ICD-10-CM

## 2022-06-27 DIAGNOSIS — M6281 Muscle weakness (generalized): Secondary | ICD-10-CM

## 2022-07-02 ENCOUNTER — Ambulatory Visit: Payer: Medicaid Other

## 2022-07-02 DIAGNOSIS — M6281 Muscle weakness (generalized): Secondary | ICD-10-CM

## 2022-07-02 DIAGNOSIS — R262 Difficulty in walking, not elsewhere classified: Secondary | ICD-10-CM | POA: Diagnosis not present

## 2022-07-02 DIAGNOSIS — R2681 Unsteadiness on feet: Secondary | ICD-10-CM

## 2022-07-02 NOTE — Therapy (Signed)
OUTPATIENT PHYSICAL THERAPY NEURO TREATMENT, Progress Note and Recertification   Patient Name: Tony Schmitt MRN: [id number] DOB:06/11/1971, 51 y.o., male Today's Date: 07/02/2022   PCP: Charlott Rakes, MD REFERRING PROVIDER: Charlett Blake, MD  Progress Note Reporting Period 05/15/22 to 07/02/22  See note below for Objective Data and Assessment of Progress/Goals.      PT End of Session - 07/02/22 1138     Visit Number 8    Number of Visits 14    Date for PT Re-Evaluation 08/13/22    Authorization Type [place] Medicaid UHC    Authorization Time Period 27 VL    Authorization - Visit Number 8    Authorization - Number of Visits 27    Progress Note Due on Visit 18    PT Start Time 6644    PT Stop Time 1230    PT Time Calculation (min) 45 min    Activity Tolerance Patient tolerated treatment well    Behavior During Therapy Flat affect;WFL for tasks assessed/performed             Past Medical History:  Diagnosis Date   Chronic kidney disease    Diabetes mellitus without complication ([place])    Hypertension    Stroke [facility])    Past Surgical History:  Procedure Laterality Date   BUBBLE STUDY  04/18/2020   Procedure: BUBBLE STUDY;  Surgeon: Sueanne Margarita, MD;  Location: [place];  Service: Cardiovascular;;   TEE WITHOUT CARDIOVERSION N/A 04/18/2020   Procedure: TRANSESOPHAGEAL ECHOCARDIOGRAM (TEE);  Surgeon: Sueanne Margarita, MD;  Location: [facility] ENDOSCOPY;  Service: Cardiovascular;  Laterality: N/A;   Patient Active Problem List   Diagnosis Date Noted   Acquired left foot drop 05/22/2022   Nephrotic syndrome    Peripheral edema    Abnormality of gait 05/23/2020   Type 2 diabetes mellitus with hyperglycemia, without long-term current use of insulin (HCC)    Thrombocytosis    Benign essential HTN    Hypoalbuminemia due to protein-calorie malnutrition (HCC)    Acute blood loss anemia    Leukocytosis    Slow transit constipation    AKI (acute kidney injury) ([place])     Diabetes mellitus type 2 with complications, uncontrolled    CVA (cerebral vascular accident) ([place]) 04/22/2020   Acute left-sided weakness    Tobacco abuse    Essential hypertension    Acute CVA (cerebrovascular accident) ([place]) 04/16/2020   Hypertensive urgency 04/16/2020   Type 2 diabetes mellitus with vascular disease ([place]) 04/16/2020   CKD (chronic kidney disease) stage 3, GFR 30-59 ml/min ([place]) 04/23/2019   Hyperlipidemia 04/18/2019   Dyslipidemia associated with type 2 diabetes mellitus ([place]) 08/29/2018   Hypertension associated with type 2 diabetes mellitus ([place]) 08/29/2018    ONSET DATE: 04/16/20  REFERRING DIAG: I34.742 (ICD-10-CM) - Acquired left foot drop G81.10 (ICD-10-CM) - Spastic hemiplegia, unspecified etiology, unspecified laterality   THERAPY DIAG:  Difficulty in walking, not elsewhere classified  Muscle weakness (generalized)  Unsteadiness on feet  Rationale for Evaluation and Treatment Rehabilitation  SUBJECTIVE:  SUBJECTIVE STATEMENT: No falls or new issues, likely getting the AFO in December. Enjoying the pool activities and would like more training  Pt accompanied by: family member (mother)  PERTINENT HISTORY: CKD, DM, HTN, stroke  PAIN:  Are you having pain? No  PRECAUTIONS: None  WEIGHT BEARING RESTRICTIONS No  FALLS: Has patient fallen in last 6 months? No  LIVING ENVIRONMENT: Lives with: lives with their family Lives in: House/apartment, one-level Stairs: Yes: External: 2 steps; none Has following equipment at home: Single point cane and hurrycane  PLOF: Independent with basic ADLs, Independent with household mobility with device, and uses manual wheelchair for community distances (someone pushes)  PATIENT GOALS be able to hold stuff in left  hand  OBJECTIVE:   TODAY'S TREATMENT: 07/02/22 Activity Comments  TUG test 1) with cane: 26.56 sec 2) w/ cane and AFO: 21.18 sec  Gait speed: 10 meterWT 1) with cane; 23.75 sec= 1.38 ft/sec 2) with cane and AFO; 22 sec = 1.49 ft/sec  Pt education in gym equipment Leg press, leg extension, seated hamstring curls  Aerobic step  -forward step/lunge 1x10 -sidestep and lunge 1x10 -forward step-up 1x10 -forward step down LLE 1x10 (4" riser)  Standing gastroc stretch 2 min        TODAY'S TREATMENT:   11/22 Aquatic Pt seen for aquatic therapy today.  Treatment took place in water 3.25-4.5 ft in depth at the [facility]. Temp of water was 92.  Pt entered/exited the pool via stairs using step to pattern with hand rail.   *Walking forward back and side stepping in 3.6 ft then working way to 4.8. Initially using black barbell, progressed to 1 yellow hand buoy then to unsupported.  VC for step length, pacing and control. *3 way tap beginning L then R x 3 ea then x 8 alternating rue support on yellow hand buoy *hip flex/ext R/L x10 ue support on wall *stepping up then down from  water step leading lle bith ways.  Cues for weight shift and immediate standing balance completed in 3.11f *STS from bench onto water step x 10. Cues for immediate standing balance. *stair tapping in 3.6 ft without ue support   Pt requires the buoyancy and hydrostatic pressure of water for support, and to offload joints by unweighting joint load by at least 50 % in navel deep water and by at least 75-80% in chest to neck deep water.  Viscosity of the water is needed for resistance of strengthening. Water current perturbations provides challenge to standing balance requiring increased core activation.   HOME EXERCISE PROGRAM: Access Code: [email or web address]: [email or web address] Date: 07/02/2022 Prepared by: KSherlyn Lees Exercises - Step Up  - 1 x daily - 7 x weekly - 1-3 sets - 10 reps -  Lateral Step Up  - 1 x daily - 7 x weekly - 1-3 sets - 10 reps - Forward Step Down  - 1 x daily - 7 x weekly - 1-3 sets - 10 reps  DIAGNOSTIC FINDINGS: imaging studies at time of CVA  COGNITION: Overall cognitive status: Within functional limits for tasks assessed   SENSATION: Light touch: Impaired  LUE/LLE  COORDINATION: Grossly impaired LUE/LLE  EDEMA:  Minimal in LE, Left hand   MUSCLE TONE: LLE: Flaccid   MUSCLE LENGTH: Left achilles tightness. Lacking 5-10 degrees for plantigrade. With seated soleus stretch (left foot met heads positioned edge of 2" step) lacking 1.5 inches for heel contact  DTRs:    POSTURE: weight shifts right  LOWER EXTREMITY ROM:     Active  Right Eval Left Eval  Hip flexion    Hip extension    Hip abduction    Hip adduction    Hip internal rotation    Hip external rotation    Knee flexion    Knee extension    Ankle dorsiflexion 15 -10  Ankle plantarflexion    Ankle inversion    Ankle eversion     (Blank rows = not tested)  LOWER EXTREMITY MMT:    MMT Right Eval Left Eval  Hip flexion 5 3-  Hip extension    Hip abduction (in sitting) 5 4-  Hip adduction (in sitting) 5 4  Hip internal rotation    Hip external rotation    Knee flexion 5 4-  Knee extension 5 4  Ankle dorsiflexion 5 0  Ankle plantarflexion 4 0  Ankle inversion 5 0  Ankle eversion 5 0  (Blank rows = not tested)  BED MOBILITY:  NT  TRANSFERS: Assistive device utilized: Single point cane  Sit to stand: Complete Independence Stand to sit: Complete Independence and Modified independence Chair to chair: Complete Independence and Modified independence Floor:  NT  RAMP:  NT  CURB:  Level of Assistance: SBA and CGA Assistive device utilized: Single point cane Curb Comments: must lead up with RLE and descend w/ LLE  STAIRS:  Level of Assistance: Modified independence  Stair Negotiation Technique: Step to Pattern with Single Rail on Right or  cane  Number of Stairs: 4   Height of Stairs: 6  Comments: turns slightly sideways  GAIT: Gait pattern: decreased stride length, circumduction- Left, Left foot flat, abducted- Left, and poor foot clearance- Left Distance walked: 80 ft Assistive device utilized: Single point cane Level of assistance: Modified independence Comments: level surfaces  FUNCTIONAL TESTs:  5 times sit to stand: 13.90 sec Timed up and go (TUG): 26 sec w/ HUrrycane 10 meter walk test: 1.3 ft/sec Berg Balance Scale: 45/56  PATIENT SURVEYS:  NT    PATIENT EDUCATION: Education details: see above Person educated: Patient and Parent Education method: Customer service manager Education comprehension: verbalized understanding     GOALS: Goals reviewed with patient? Yes  SHORT TERM GOALS: Target date: 06/12/2022  Patient will be independent in HEP to improve functional outcomes Baseline: initiated land-based activities Goal status: on-going; to now include aquatic interventions/HEP  2.  Pt to be able to don/doff left AFO to improve functional independence and safety with transfers and gait Baseline: N/A. Still awaiting arrival of device from [employer] Goal status: on-going    LONG TERM GOALS: Target date: 07/10/2022  Demonstrate improved safety and speed of ambulation per time 18 sec TUG test to reduce risk for falls Baseline: 26 sec; (07/02/22) 26 sec Goal status: On-going  2.  Demonstrate improved efficiency of gait by ambulating with speed of 2.0 ft/sec for increased independence in community Baseline: 1.3 ft/sec; (07/02/22) 1.38 ft/sec w/ cane Goal status: On-going   ASSESSMENT:  CLINICAL IMPRESSION: Treatment session today with emphasis on re-assessment and note that no change with gait speed or TUG test time (fall risk) since start of care.  Use of left AFO obviously improves gait speed and quality of gait cycle as well as once left AFO is applied pt exhibits decrease in circumduction  and able to increase gait speed as evident by 10 meter walk test and TUG test times. This therapist has facilitated acquisition of AFO and relevant intervention and is awaiting device to  be delivered/furnished by local [employer] vendor.  Patient would benefit from continued sessions to advance POC details and further develop and refine HEP which will include aquatic-based interventions as pt has been able to obtain membership to facility for continued, on-going aquatic activities for HEP development.      OBJECTIVE IMPAIRMENTS Abnormal gait, decreased activity tolerance, decreased balance, decreased coordination, decreased knowledge of condition, decreased knowledge of use of DME, difficulty walking, decreased ROM, decreased strength, impaired flexibility, and impaired tone.   ACTIVITY LIMITATIONS carrying, standing, stairs, transfers, and locomotion level  PARTICIPATION LIMITATIONS: cleaning, shopping, community activity, and yard work  PERSONAL FACTORS Time since onset of injury/illness/exacerbation and 1-2 comorbidities: DM, HTN, CKD  are also affecting patient's functional outcome.   REHAB POTENTIAL: Good  CLINICAL DECISION MAKING: Stable/uncomplicated  EVALUATION COMPLEXITY: Low  PLAN: PT FREQUENCY: 1x/week  PT DURATION: 8 weeks  PLANNED INTERVENTIONS: Therapeutic exercises, Therapeutic activity, Neuromuscular re-education, Balance training, Gait training, Patient/Family education, Self Care, Joint mobilization, Stair training, Vestibular training, Canalith repositioning, Orthotic/Fit training, DME instructions, Aquatic Therapy, Dry Needling, Electrical stimulation, Wheelchair mobility training, Taping, and Manual therapy  PLAN FOR NEXT SESSION:aquatic HEP development    12:44 PM, 07/02/22 M. Sherlyn Lees, PT, DPT Physical Therapist- [place] Office Number: [phone]

## 2022-07-10 ENCOUNTER — Ambulatory Visit (HOSPITAL_BASED_OUTPATIENT_CLINIC_OR_DEPARTMENT_OTHER): Payer: Medicaid Other | Admitting: Physical Therapy

## 2022-07-10 ENCOUNTER — Encounter (HOSPITAL_BASED_OUTPATIENT_CLINIC_OR_DEPARTMENT_OTHER): Payer: Self-pay

## 2022-07-11 DIAGNOSIS — M21372 Foot drop, left foot: Secondary | ICD-10-CM | POA: Diagnosis not present

## 2022-07-20 ENCOUNTER — Ambulatory Visit (HOSPITAL_COMMUNITY)
Admission: RE | Admit: 2022-07-20 | Discharge: 2022-07-20 | Disposition: A | Discharge status: Home / Self Care | Payer: Medicaid Other | Source: Ambulatory Visit | Attending: Nephrology | Admitting: Nephrology

## 2022-07-20 ENCOUNTER — Encounter (HOSPITAL_BASED_OUTPATIENT_CLINIC_OR_DEPARTMENT_OTHER): Payer: Self-pay | Admitting: Physical Therapy

## 2022-07-20 ENCOUNTER — Encounter (HOSPITAL_BASED_OUTPATIENT_CLINIC_OR_DEPARTMENT_OTHER): Payer: Medicaid Other | Attending: Physical Medicine & Rehabilitation | Admitting: Physical Therapy

## 2022-07-20 VITALS — BP 143/78 | HR 72 | Temp 97.1°F | Resp 18

## 2022-07-20 DIAGNOSIS — R262 Difficulty in walking, not elsewhere classified: Secondary | ICD-10-CM

## 2022-07-20 DIAGNOSIS — M6281 Muscle weakness (generalized): Secondary | ICD-10-CM | POA: Diagnosis present

## 2022-07-20 DIAGNOSIS — R2681 Unsteadiness on feet: Secondary | ICD-10-CM

## 2022-07-20 DIAGNOSIS — N183 Chronic kidney disease, stage 3 unspecified: Secondary | ICD-10-CM | POA: Insufficient documentation

## 2022-07-20 LAB — RENAL FUNCTION PANEL
Albumin: 3.6 g/dL (ref 3.5–5.0)
Anion gap: 11 (ref 5–15)
BUN: 59 mg/dL — ABNORMAL HIGH (ref 6–20)
CO2: 21 mmol/L — ABNORMAL LOW (ref 22–32)
Calcium: 9.8 mg/dL (ref 8.9–10.3)
Chloride: 108 mmol/L (ref 98–111)
Creatinine, Ser: 4.98 mg/dL — ABNORMAL HIGH (ref 0.61–1.24)
GFR, Estimated: 13 mL/min — ABNORMAL LOW (ref >60)
Glucose, Bld: 113 mg/dL — ABNORMAL HIGH (ref 70–99)
Phosphorus: 4.2 mg/dL (ref 2.5–4.6)
Potassium: 3.8 mmol/L (ref 3.5–5.1)
Sodium: 140 mmol/L (ref 135–145)

## 2022-07-20 LAB — CBC WITH DIFFERENTIAL/PLATELET
Abs Immature Granulocytes: 0.03 10*3/uL (ref 0.00–0.07)
Basophils Absolute: 0.1 10*3/uL (ref 0.0–0.1)
Basophils Relative: 1 %
Eosinophils Absolute: 0.3 10*3/uL (ref 0.0–0.5)
Eosinophils Relative: 3 %
HCT: 36.4 % — ABNORMAL LOW (ref 39.0–52.0)
Hemoglobin: 11.7 g/dL — ABNORMAL LOW (ref 13.0–17.0)
Immature Granulocytes: 0 %
Lymphocytes Relative: 23 %
Lymphs Abs: 2.2 10*3/uL (ref 0.7–4.0)
MCH: 26.7 pg (ref 26.0–34.0)
MCHC: 32.1 g/dL (ref 30.0–36.0)
MCV: 83.1 fL (ref 80.0–100.0)
Monocytes Absolute: 0.6 10*3/uL (ref 0.1–1.0)
Monocytes Relative: 6 %
Neutro Abs: 6.5 10*3/uL (ref 1.7–7.7)
Neutrophils Relative %: 67 %
Platelets: 356 10*3/uL (ref 150–400)
RBC: 4.38 MIL/uL (ref 4.22–5.81)
RDW: 15.6 % — ABNORMAL HIGH (ref 11.5–15.5)
WBC: 9.7 10*3/uL (ref 4.0–10.5)
nRBC: 0 % (ref 0.0–0.2)

## 2022-07-20 LAB — POCT HEMOGLOBIN-HEMACUE: Hemoglobin: 12 g/dL — ABNORMAL LOW (ref 13.0–17.0)

## 2022-07-20 MED ORDER — EPOETIN ALFA-EPBX 10000 UNIT/ML IJ SOLN: 20000.0000 [IU] | INTRAMUSCULAR | Status: DC

## 2022-07-20 NOTE — Therapy (Signed)
OUTPATIENT PHYSICAL THERAPY NEURO TREATMENT, Progress Note and Recertification   Patient Name: Tony Schmitt MRN: [id number] DOB:11-07-1970, 51 y.o., male Today's Date: 07/20/2022   PCP: Charlott Rakes, MD REFERRING PROVIDER: Charlett Blake, MD       PT End of Session - 07/20/22 1114     Visit Number 9    Number of Visits 14    Date for PT Re-Evaluation 08/13/22    Authorization Type [place] Medicaid UHC    Authorization Time Period 27 VL    Authorization - Number of Visits 27    Progress Note Due on Visit 18    PT Start Time 1031    PT Stop Time 1114    PT Time Calculation (min) 43 min    Activity Tolerance Patient tolerated treatment well    Behavior During Therapy Flat affect;WFL for tasks assessed/performed              Past Medical History:  Diagnosis Date   Chronic kidney disease    Diabetes mellitus without complication ([place])    Hypertension    Stroke [facility])    Past Surgical History:  Procedure Laterality Date   BUBBLE STUDY  04/18/2020   Procedure: BUBBLE STUDY;  Surgeon: Sueanne Margarita, MD;  Location: [facility];  Service: Cardiovascular;;   TEE WITHOUT CARDIOVERSION N/A 04/18/2020   Procedure: TRANSESOPHAGEAL ECHOCARDIOGRAM (TEE);  Surgeon: Sueanne Margarita, MD;  Location[facility] END[facility]COPY;  Service: Cardiovascular;  Laterality: N/A;   Patient Active Problem List   Diagnosis Date Noted   Acquired left foot drop 05/22/2022   Nephrotic syndrome    Peripheral edema    Abnormality of gait 05/23/2020   Type 2 diabetes mellitus with hyperglycemia, without long-term current use of insulin (HCC)    Thrombocytosis    Benign essential HTN    Hypoalbuminemia due to protein-calorie malnutrition (HCC)    Acute blood loss anemia    Leukocytosis    Slow transit constipation    AKI (acute kidney injury) ([place])    Diabetes mellitus type 2 with complications, uncontrolled    CVA (cerebral vascular accident) ([place]) 04/22/2020   Acute left-sided weakness    Tobacco  abuse    Essential hypertension    Acute CVA (cerebrovascular accident) ([place]) 04/16/2020   Hypertensive urgency 04/16/2020   Type 2 diabetes mellitus with vascular disease ([place]) 04/16/2020   CKD (chronic kidney disease) stage 3, GFR 30-59 ml/min ([place]) 04/23/2019   Hyperlipidemia 04/18/2019   Dyslipidemia associated with type 2 diabetes mellitus ([place]) 08/29/2018   Hypertension associated with type 2 diabetes mellitus ([place]) 08/29/2018    ONSET DATE: 04/16/20  REFERRING DIAG: J44.920 (ICD-10-CM) - Acquired left foot drop G81.10 (ICD-10-CM) - Spastic hemiplegia, unspecified etiology, unspecified laterality   THERAPY DIAG:  Difficulty in walking, not elsewhere classified  Muscle weakness (generalized)  Unsteadiness on feet  Rationale for Evaluation and Treatment Rehabilitation  SUBJECTIVE:  SUBJECTIVE STATEMENT: No falls or new issues.  Pt reports he has not been to the pool in the last week.  Pt accompanied by: family member (mother)  PERTINENT HISTORY: CKD, DM, HTN, stroke  PAIN:  Are you having pain? No  PRECAUTIONS: None  WEIGHT BEARING RESTRICTIONS No  FALLS: Has patient fallen in last 6 months? No  LIVING ENVIRONMENT: Lives with: lives with their family Lives in: House/apartment, one-level Stairs: Yes: External: 2 steps; none Has following equipment at home: Single point cane and hurrycane  PLOF: Independent with basic ADLs, Independent with household mobility with device, and uses manual wheelchair for community distances (someone pushes)  PATIENT GOALS be able to hold stuff in left hand  OBJECTIVE:    TODAY'S TREATMENT:   07/20/22 Aquatic Pt seen for aquatic therapy today.  Treatment took place in water 3.25-4.5 ft in depth at the [facility]. Temp of water  was 92.  Pt entered/exited the pool via stairs using step to pattern with hand rail.   *Walking forward back and side stepping in 3.6 ft then working way to 4.8. Initially using black barbell, progressed to 1 yellow hand buoy then to unsupported.  VC for step length, pacing and control. *3 way tap beginning L then R x10 then alternating rue support on yellow hand buoy *stair tapping in 3.6 ft with ue support of LUE on yellow hand buoy -step ups leading R then L x10.Cues for left knee/hip flex *STS from bench onto water step x 10. Cues for immediate standing balance *hip flex/ext R/L x10 ue support on wall *stepping up then down from  water step leading lle bith ways.  Cues for weight shift and immediate standing balance completed in 3.61f     Pt requires the buoyancy and hydrostatic pressure of water for support, and to offload joints by unweighting joint load by at least 50 % in navel deep water and by at least 75-80% in chest to neck deep water.  Viscosity of the water is needed for resistance of strengthening. Water current perturbations provides challenge to standing balance requiring increased core activation.  TODAY'S TREATMENT: 07/02/22 Activity Comments  TUG test 1) with cane: 26.56 sec 2) w/ cane and AFO: 21.18 sec  Gait speed: 10 meterWT 1) with cane; 23.75 sec= 1.38 ft/sec 2) with cane and AFO; 22 sec = 1.49 ft/sec  Pt education in gym equipment Leg press, leg extension, seated hamstring curls  Aerobic step  -forward step/lunge 1x10 -sidestep and lunge 1x10 -forward step-up 1x10 -forward step down LLE 1x10 (4" riser)  Standing gastroc stretch 2 min           HOME EXERCISE PROGRAM: Access Code: [email or web address]: [email or web address] Date: 07/02/2022 Prepared by: KSherlyn Lees Exercises - Step Up  - 1 x daily - 7 x weekly - 1-3 sets - 10 reps - Lateral Step Up  - 1 x daily - 7 x weekly - 1-3 sets - 10 reps - Forward Step Down  - 1 x daily - 7 x weekly -  1-3 sets - 10 reps  DIAGNOSTIC FINDINGS: imaging studies at time of CVA  COGNITION: Overall cognitive status: Within functional limits for tasks assessed   SENSATION: Light touch: Impaired  LUE/LLE  COORDINATION: Grossly impaired LUE/LLE  EDEMA:  Minimal in LE, Left hand   MUSCLE TONE: LLE: Flaccid   MUSCLE LENGTH: Left achilles tightness. Lacking 5-10 degrees for plantigrade. With seated soleus stretch (left foot met heads positioned edge of 2"  step) lacking 1.5 inches for heel contact  DTRs:    POSTURE: weight shifts right  LOWER EXTREMITY ROM:     Active  Right Eval Left Eval  Hip flexion    Hip extension    Hip abduction    Hip adduction    Hip internal rotation    Hip external rotation    Knee flexion    Knee extension    Ankle dorsiflexion 15 -10  Ankle plantarflexion    Ankle inversion    Ankle eversion     (Blank rows = not tested)  LOWER EXTREMITY MMT:    MMT Right Eval Left Eval  Hip flexion 5 3-  Hip extension    Hip abduction (in sitting) 5 4-  Hip adduction (in sitting) 5 4  Hip internal rotation    Hip external rotation    Knee flexion 5 4-  Knee extension 5 4  Ankle dorsiflexion 5 0  Ankle plantarflexion 4 0  Ankle inversion 5 0  Ankle eversion 5 0  (Blank rows = not tested)  BED MOBILITY:  NT  TRANSFERS: Assistive device utilized: Single point cane  Sit to stand: Complete Independence Stand to sit: Complete Independence and Modified independence Chair to chair: Complete Independence and Modified independence Floor:  NT  RAMP:  NT  CURB:  Level of Assistance: SBA and CGA Assistive device utilized: Single point cane Curb Comments: must lead up with RLE and descend w/ LLE  STAIRS:  Level of Assistance: Modified independence  Stair Negotiation Technique: Step to Pattern with Single Rail on Right or cane  Number of Stairs: 4   Height of Stairs: 6  Comments: turns slightly sideways  GAIT: Gait pattern: decreased  stride length, circumduction- Left, Left foot flat, abducted- Left, and poor foot clearance- Left Distance walked: 80 ft Assistive device utilized: Single point cane Level of assistance: Modified independence Comments: level surfaces  FUNCTIONAL TESTs:  5 times sit to stand: 13.90 sec Timed up and go (TUG): 26 sec w/ HUrrycane 10 meter walk test: 1.3 ft/sec Berg Balance Scale: 45/56  PATIENT SURVEYS:  NT    PATIENT EDUCATION: Education details: see above Person educated: Patient and Parent Education method: Customer service manager Education comprehension: verbalized understanding     GOALS: Goals reviewed with patient? Yes  SHORT TERM GOALS: Target date: 06/12/2022  Patient will be independent in HEP to improve functional outcomes Baseline: initiated land-based activities Goal status: on-going; to now include aquatic interventions/HEP  2.  Pt to be able to don/doff left AFO to improve functional independence and safety with transfers and gait Baseline: N/A. Still awaiting arrival of device from [facility] Goal status: on-going    LONG TERM GOALS: Target date: 07/10/2022  Demonstrate improved safety and speed of ambulation per time 18 sec TUG test to reduce risk for falls Baseline: 26 sec; (07/02/22) 26 sec Goal status: On-going  2.  Demonstrate improved efficiency of gait by ambulating with speed of 2.0 ft/sec for increased independence in community Baseline: 1.3 ft/sec; (07/02/22) 1.38 ft/sec w/ cane Goal status: On-going   ASSESSMENT:  CLINICAL IMPRESSION: Emphasis today on reactive balance and power. Increased walking speed to jog as able forward and back. Progressed STS to a lower surface rising gaining immediate standing balance with difficulty. With repetitive completion pt gains motor plan and improves execution. A few episodes of LOB with forward jog which he recovers from indep.  Progressing well in setting. Goals ongoing   Re-assessment  impression: Treatment session today  with emphasis on re-assessment and note that no change with gait speed or TUG test time (fall risk) since start of care.  Use of left AFO obviously improves gait speed and quality of gait cycle as well as once left AFO is applied pt exhibits decrease in circumduction and able to increase gait speed as evident by 10 meter walk test and TUG test times. This therapist has facilitated acquisition of AFO and relevant intervention and is awaiting device to be delivered/furnished by local [employer] vendor.  Patient would benefit from continued sessions to advance POC details and further develop and refine HEP which will include aquatic-based interventions as pt has been able to obtain membership to facility for continued, on-going aquatic activities for HEP development.      OBJECTIVE IMPAIRMENTS Abnormal gait, decreased activity tolerance, decreased balance, decreased coordination, decreased knowledge of condition, decreased knowledge of use of DME, difficulty walking, decreased ROM, decreased strength, impaired flexibility, and impaired tone.   ACTIVITY LIMITATIONS carrying, standing, stairs, transfers, and locomotion level  PARTICIPATION LIMITATIONS: cleaning, shopping, community activity, and yard work  PERSONAL FACTORS Time since onset of injury/illness/exacerbation and 1-2 comorbidities: DM, HTN, CKD  are also affecting patient's functional outcome.   REHAB POTENTIAL: Good  CLINICAL DECISION MAKING: Stable/uncomplicated  EVALUATION COMPLEXITY: Low  PLAN: PT FREQUENCY: 1x/week  PT DURATION: 8 weeks  PLANNED INTERVENTIONS: Therapeutic exercises, Therapeutic activity, Neuromuscular re-education, Balance training, Gait training, Patient/Family education, Self Care, Joint mobilization, Stair training, Vestibular training, Canalith repositioning, Orthotic/Fit training, DME instructions, Aquatic Therapy, Dry Needling, Electrical stimulation, Wheelchair mobility  training, Taping, and Manual therapy  PLAN FOR NEXT SESSION:aquatic HEP development    11:15 AM, 07/20/22 Annamarie Major) [place] MPT

## 2022-07-23 NOTE — Therapy (Signed)
OUTPATIENT PHYSICAL THERAPY NEURO TREATMENT, Progress Note and Recertification   Patient Name: KLINTON CANDELAS MRN: [id number] DOB:March 25, 1971, 51 y.o., male Today's Date: 07/24/2022   PCP: Charlott Rakes, MD REFERRING PROVIDER: Charlett Blake, MD       PT End of Session - 07/24/22 0940     Visit Number 10    Number of Visits 14    Date for PT Re-Evaluation 08/13/22    Authorization Type  Medicaid UHC    Authorization Time Period 56 VL    Authorization - Number of Visits 27    Progress Note Due on Visit 18    PT Start Time 0945    PT Stop Time 1030    PT Time Calculation (min) 45 min    Activity Tolerance Patient tolerated treatment well    Behavior During Therapy Flat affect;WFL for tasks assessed/performed               Past Medical History:  Diagnosis Date   Chronic kidney disease    Diabetes mellitus without complication ([place])    Hypertension    Stroke [facility])    Past Surgical History:  Procedure Laterality Date   BUBBLE STUDY  04/18/2020   Procedure: BUBBLE STUDY;  Surgeon: Sueanne Margarita, MD;  Location: [place];  Service: Cardiovascular;;   TEE WITHOUT CARDIOVERSION N/A 04/18/2020   Procedure: TRANSESOPHAGEAL ECHOCARDIOGRAM (TEE);  Surgeon: Sueanne Margarita, MD;  Location: [facility] ENDOSCOPY;  Service: Cardiovascular;  Laterality: N/A;   Patient Active Problem List   Diagnosis Date Noted   Acquired left foot drop 05/22/2022   Nephrotic syndrome    Peripheral edema    Abnormality of gait 05/23/2020   Type 2 diabetes mellitus with hyperglycemia, without long-term current use of insulin (HCC)    Thrombocytosis    Benign essential HTN    Hypoalbuminemia due to protein-calorie malnutrition (HCC)    Acute blood loss anemia    Leukocytosis    Slow transit constipation    AKI (acute kidney injury) ([place])    Diabetes mellitus type 2 with complications, uncontrolled    CVA (cerebral vascular accident) ([place]) 04/22/2020   Acute left-sided weakness     Tobacco abuse    Essential hypertension    Acute CVA (cerebrovascular accident) ([place]) 04/16/2020   Hypertensive urgency 04/16/2020   Type 2 diabetes mellitus with vascular disease ([place]) 04/16/2020   CKD (chronic kidney disease) stage 3, GFR 30-59 ml/min ([place]) 04/23/2019   Hyperlipidemia 04/18/2019   Dyslipidemia associated with type 2 diabetes mellitus ([place]) 08/29/2018   Hypertension associated with type 2 diabetes mellitus ([place]) 08/29/2018    ONSET DATE: 04/16/20  REFERRING DIAG: O14.103 (ICD-10-CM) - Acquired left foot drop G81.10 (ICD-10-CM) - Spastic hemiplegia, unspecified etiology, unspecified laterality   THERAPY DIAG:  Difficulty in walking, not elsewhere classified  Muscle weakness (generalized)  Unsteadiness on feet  Rationale for Evaluation and Treatment Rehabilitation  SUBJECTIVE:  SUBJECTIVE STATEMENT: No falls or new issues.  Pt reports he has not been to the pool in the last week.  Pt accompanied by: family member (mother)  PERTINENT HISTORY: CKD, DM, HTN, stroke  PAIN:  Are you having pain? No  PRECAUTIONS: None  WEIGHT BEARING RESTRICTIONS No  FALLS: Has patient fallen in last 6 months? No  LIVING ENVIRONMENT: Lives with: lives with their family Lives in: House/apartment, one-level Stairs: Yes: External: 2 steps; none Has following equipment at home: Single point cane and hurrycane  PLOF: Independent with basic ADLs, Independent with household mobility with device, and uses manual wheelchair for community distances (someone pushes)  PATIENT GOALS be able to hold stuff in left hand  OBJECTIVE:    TODAY'S TREATMENT:   07/24/22 Aquatic Pt seen for aquatic therapy today.  Treatment took place in water 3.25-4.5 ft in depth at the [facility]. Temp  of water was 92.  Pt entered/exited the pool via stairs using step to pattern with hand rail.   *Walking forward back and side stepping in 22f ue support yellow hand buoys *running forward and back x 6 widths ea then fast side stepping x 6 widths.  Cues for stpe length/control *reactive balance tasks: 4 square with mirroring; walking in all directions and depths changing directions as therapist calls out *squats in 3 ft x 10 then on bottom step for increased challenge of strength and balance x 10 *STS from 3rd step (bottom) x 10. Cues for immediate standing balance/h shifting *stair tapping in 3.6 ft with ue support of LUE on yellow hand buoy *Forward/backward amb  with yellow hand buoy submerged at hip for increased left core engagement *3 way tap beginning L then R x10 then alternating rue support on yellow hand buoy -step ups leading R then L x10.Cues for left knee/hip flex *hip flex/ext R x10 ue support on wall *Left hip flex and quad stretch on bottom step 20s hold x 3  Pt requires the buoyancy and hydrostatic pressure of water for support, and to offload joints by unweighting joint load by at least 50 % in navel deep water and by at least 75-80% in chest to neck deep water.  Viscosity of the water is needed for resistance of strengthening. Water current perturbations provides challenge to standing balance requiring increased core activation.  TODAY'S TREATMENT: 07/02/22 Activity Comments  TUG test 1) with cane: 26.56 sec 2) w/ cane and AFO: 21.18 sec  Gait speed: 10 meterWT 1) with cane; 23.75 sec= 1.38 ft/sec 2) with cane and AFO; 22 sec = 1.49 ft/sec  Pt education in gym equipment Leg press, leg extension, seated hamstring curls  Aerobic step  -forward step/lunge 1x10 -sidestep and lunge 1x10 -forward step-up 1x10 -forward step down LLE 1x10 (4" riser)  Standing gastroc stretch 2 min           HOME EXERCISE PROGRAM: Access Code: [email or web address]:  [email or web address] Date: 07/02/2022 Prepared by: KSherlyn Lees Exercises - Step Up  - 1 x daily - 7 x weekly - 1-3 sets - 10 reps - Lateral Step Up  - 1 x daily - 7 x weekly - 1-3 sets - 10 reps - Forward Step Down  - 1 x daily - 7 x weekly - 1-3 sets - 10 reps  DIAGNOSTIC FINDINGS: imaging studies at time of CVA  COGNITION: Overall cognitive status: Within functional limits for tasks assessed   SENSATION: Light touch: Impaired  LUE/LLE  COORDINATION: Grossly impaired LUE/LLE  EDEMA:  Minimal in LE, Left hand   MUSCLE TONE: LLE: Flaccid   MUSCLE LENGTH: Left achilles tightness. Lacking 5-10 degrees for plantigrade. With seated soleus stretch (left foot met heads positioned edge of 2" step) lacking 1.5 inches for heel contact  DTRs:    POSTURE: weight shifts right  LOWER EXTREMITY ROM:     Active  Right Eval Left Eval  Hip flexion    Hip extension    Hip abduction    Hip adduction    Hip internal rotation    Hip external rotation    Knee flexion    Knee extension    Ankle dorsiflexion 15 -10  Ankle plantarflexion    Ankle inversion    Ankle eversion     (Blank rows = not tested)  LOWER EXTREMITY MMT:    MMT Right Eval Left Eval  Hip flexion 5 3-  Hip extension    Hip abduction (in sitting) 5 4-  Hip adduction (in sitting) 5 4  Hip internal rotation    Hip external rotation    Knee flexion 5 4-  Knee extension 5 4  Ankle dorsiflexion 5 0  Ankle plantarflexion 4 0  Ankle inversion 5 0  Ankle eversion 5 0  (Blank rows = not tested)  BED MOBILITY:  NT  TRANSFERS: Assistive device utilized: Single point cane  Sit to stand: Complete Independence Stand to sit: Complete Independence and Modified independence Chair to chair: Complete Independence and Modified independence Floor:  NT  RAMP:  NT  CURB:  Level of Assistance: SBA and CGA Assistive device utilized: Single point cane Curb Comments: must lead up with RLE and  descend w/ LLE  STAIRS:  Level of Assistance: Modified independence  Stair Negotiation Technique: Step to Pattern with Single Rail on Right or cane  Number of Stairs: 4   Height of Stairs: 6  Comments: turns slightly sideways  GAIT: Gait pattern: decreased stride length, circumduction- Left, Left foot flat, abducted- Left, and poor foot clearance- Left Distance walked: 80 ft Assistive device utilized: Single point cane Level of assistance: Modified independence Comments: level surfaces  FUNCTIONAL TESTs:  5 times sit to stand: 13.90 sec Timed up and go (TUG): 26 sec w/ HUrrycane 10 meter walk test: 1.3 ft/sec Berg Balance Scale: 45/56  PATIENT SURVEYS:  NT    PATIENT EDUCATION: Education details: see above Person educated: Patient and Parent Education method: Customer service manager Education comprehension: verbalized understanding     GOALS: Goals reviewed with patient? Yes  SHORT TERM GOALS: Target date: 06/12/2022  Patient will be independent in HEP to improve functional outcomes Baseline: initiated land-based activities Goal status: on-going; to now include aquatic interventions/HEP  2.  Pt to be able to don/doff left AFO to improve functional independence and safety with transfers and gait Baseline: N/A. Still awaiting arrival of device from [facility] Goal status: on-going    LONG TERM GOALS: Target date: 07/10/2022  Demonstrate improved safety and speed of ambulation per time 18 sec TUG test to reduce risk for falls Baseline: 26 sec; (07/02/22) 26 sec Goal status: On-going  2.  Demonstrate improved efficiency of gait by ambulating with speed of 2.0 ft/sec for increased independence in community Baseline: 1.3 ft/sec; (07/02/22) 1.38 ft/sec w/ cane Goal status: On-going   ASSESSMENT:  CLINICAL IMPRESSION: He tolerates progression of reactive balance and power/strengthening well.  No complaints of pain.  He has good attitude with repeating tasks for  improved execution/without frustration.  Progressing well towards  goals.     Re-assessment impression: Treatment session today with emphasis on re-assessment and note that no change with gait speed or TUG test time (fall risk) since start of care.  Use of left AFO obviously improves gait speed and quality of gait cycle as well as once left AFO is applied pt exhibits decrease in circumduction and able to increase gait speed as evident by 10 meter walk test and TUG test times. This therapist has facilitated acquisition of AFO and relevant intervention and is awaiting device to be delivered/furnished by local [employer] vendor.  Patient would benefit from continued sessions to advance POC details and further develop and refine HEP which will include aquatic-based interventions as pt has been able to obtain membership to facility for continued, on-going aquatic activities for HEP development.      OBJECTIVE IMPAIRMENTS Abnormal gait, decreased activity tolerance, decreased balance, decreased coordination, decreased knowledge of condition, decreased knowledge of use of DME, difficulty walking, decreased ROM, decreased strength, impaired flexibility, and impaired tone.   ACTIVITY LIMITATIONS carrying, standing, stairs, transfers, and locomotion level  PARTICIPATION LIMITATIONS: cleaning, shopping, community activity, and yard work  PERSONAL FACTORS Time since onset of injury/illness/exacerbation and 1-2 comorbidities: DM, HTN, CKD  are also affecting patient's functional outcome.   REHAB POTENTIAL: Good  CLINICAL DECISION MAKING: Stable/uncomplicated  EVALUATION COMPLEXITY: Low  PLAN: PT FREQUENCY: 1x/week  PT DURATION: 8 weeks  PLANNED INTERVENTIONS: Therapeutic exercises, Therapeutic activity, Neuromuscular re-education, Balance training, Gait training, Patient/Family education, Self Care, Joint mobilization, Stair training, Vestibular training, Canalith repositioning, Orthotic/Fit training, DME  instructions, Aquatic Therapy, Dry Needling, Electrical stimulation, Wheelchair mobility training, Taping, and Manual therapy  PLAN FOR NEXT SESSION:aquatic HEP development    9:41 AM, 07/24/22 Annamarie Major) [place] MPT

## 2022-07-24 ENCOUNTER — Ambulatory Visit (HOSPITAL_BASED_OUTPATIENT_CLINIC_OR_DEPARTMENT_OTHER): Payer: Medicaid Other | Attending: Physical Medicine & Rehabilitation | Admitting: Physical Therapy

## 2022-07-24 ENCOUNTER — Encounter (HOSPITAL_BASED_OUTPATIENT_CLINIC_OR_DEPARTMENT_OTHER): Payer: Self-pay | Admitting: Physical Therapy

## 2022-07-24 DIAGNOSIS — M6281 Muscle weakness (generalized): Secondary | ICD-10-CM | POA: Diagnosis present

## 2022-07-24 DIAGNOSIS — R2681 Unsteadiness on feet: Secondary | ICD-10-CM | POA: Diagnosis present

## 2022-07-24 DIAGNOSIS — R262 Difficulty in walking, not elsewhere classified: Secondary | ICD-10-CM

## 2022-07-28 ENCOUNTER — Other Ambulatory Visit: Payer: Self-pay | Admitting: Family Medicine

## 2022-07-28 DIAGNOSIS — E1122 Type 2 diabetes mellitus with diabetic chronic kidney disease: Secondary | ICD-10-CM

## 2022-07-28 DIAGNOSIS — K2100 Gastro-esophageal reflux disease with esophagitis, without bleeding: Secondary | ICD-10-CM

## 2022-07-31 ENCOUNTER — Encounter: Payer: Medicaid Other | Attending: Physical Medicine & Rehabilitation | Admitting: Physical Medicine & Rehabilitation

## 2022-07-31 ENCOUNTER — Encounter: Payer: Self-pay | Admitting: Physical Medicine & Rehabilitation

## 2022-07-31 VITALS — BP 132/81 | HR 73 | Ht 65.0 in | Wt 160.0 lb

## 2022-07-31 DIAGNOSIS — G8110 Spastic hemiplegia affecting unspecified side: Secondary | ICD-10-CM

## 2022-07-31 DIAGNOSIS — M21372 Foot drop, left foot: Secondary | ICD-10-CM | POA: Diagnosis not present

## 2022-07-31 NOTE — Progress Notes (Signed)
Subjective:    Patient ID: Tony Schmitt, male    DOB: 04/28/1971, 51 y.o.   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  He completed inpatient rehabilitation as well as outpatient rehabilitation.  He did see Dr. Posey Pronto at this clinic for follow-up in 2021.  He has been following with his primary care physician. Worked with [facility], PT. HPI History of nephrotic syndrome, type 2 diabetes and hypertension. Aquatic therapy at [facility] ongoing. Patient had fitting of AFO however this will be revised by the orthotist. No new pains, no issues that he wants to discuss at this time. Pain Inventory Average Pain 0 Pain Right Now 0 My pain is  no pain  LOCATION OF PAIN  left leg stiffness, left arm stiffness, left hand stiffness  BOWEL Number of stools per week: 3  Oral laxative use Yes  Type of laxative Miralax  BLADDER Normal  Frequent urination Yes     Mobility walk with assistance use a cane ability to climb steps?  yes do you drive?  no use a wheelchair transfers alone Do you have any goals in this area?  yes  Function disabled: date disabled 04/2020 I need assistance with the following:  meal prep, household duties, and shopping Do you have any goals in this area?  yes  Neuro/Psych weakness tingling trouble walking spasms  Prior Studies Any changes since last visit?  no  Physicians involved in your care Any changes since last visit?  no   Family History  Problem Relation Age of Onset   Diabetes Mother    Diabetes Father    Hypertension Father    Cancer Father    Heart failure Other    Social History   Socioeconomic History   Marital status: Single    Spouse name: Not on file   Number of children: Not on file   Years of education: Not on file   Highest education level: Not on file  Occupational History   Not on file  Tobacco Use   Smoking status: Former    Types: Cigarettes, Cigars   Smokeless  tobacco: Never  Vaping Use   Vaping Use: Never used  Substance and Sexual Activity   Alcohol use: Not Currently   Drug use: Never   Sexual activity: Not on file  Other Topics Concern   Not on file  Social History Narrative   Not on file   Social Determinants of Health   Financial Resource Strain: Not on file  Food Insecurity: Not on file  Transportation Needs: Not on file  Physical Activity: Not on file  Stress: Not on file  Social Connections: Not on file   Past Surgical History:  Procedure Laterality Date   BUBBLE STUDY  04/18/2020   Procedure: BUBBLE STUDY;  Surgeon: Sueanne Margarita, MD;  Location: [place];  Service: Cardiovascular;;   TEE WITHOUT CARDIOVERSION N/A 04/18/2020   Procedure: TRANSESOPHAGEAL ECHOCARDIOGRAM (TEE);  Surgeon: Sueanne Margarita, MD;  Location: [facility] ENDOSCOPY;  Service: Cardiovascular;  Laterality: N/A;   Past Medical History:  Diagnosis Date   Chronic kidney disease    Diabetes mellitus without complication (HCC)    Hypertension    Stroke ([place])    Ht 5\' 5"  (1.651 m)   Wt 160 lb (72.6 kg)   BMI 26.63 kg/m   Opioid Risk Score:   Fall Risk Score:  `1  Depression screen [facility] 2/9     05/01/2022  11:31 AM 01/31/2022    2:22 PM 05/31/2021    2:14 PM 02/28/2021    1:54 PM 11/28/2020    1:46 PM 06/07/2020    3:20 PM 05/26/2020   10:49 AM  Depression screen PH[facility] 2/9  Decreased Interest 0 0 1 1 2  0 2  Down, Depressed, Hopeless 0 0 0 1 0 0 0  PHQ - 2 Score 0 0 1 2 2  0 2  Altered sleeping 3 1 2 1 2 1  0  Tired, decreased energy 2 1 1 1 2 1 3   Change in appetite 0 0 0 1 0 0 1  Feeling bad or failure about yourself  0 0 0 1 0 0 0  Trouble concentrating 0 0 0 1 0 0 0  Moving slowly or fidgety/restless 0 0 0 1 0 0 1  Suicidal thoughts 0 0 0 1 0 0 0  PHQ-9 Score 5 2 4 9 6 2 7     Review of Systems  Neurological:  Positive for weakness and numbness.       Spasms       Objective:   Physical Exam  General no acute distress Mood and affect  are appropriate Left hand has dependent edema dorsum of the hand.  No pain with hand range of motion no erythema no vasomotor changes Tone MAS 2 at the elbow flexors MAS 2 at the finger flexors MAS 3 at the wrist flexors on the left side Left lower limb tone No evidence of clonus at the ankle or the knee. With standing there is only mild left foot inversion. Motor strength is 3 - at the left deltoid and biceps 0 at the triceps 0 at the finger flexors trace thumb extension and little finger extension otherwise 0/5 at the wrist extenders Left lower extremity 3 - hip flexion 4 at the knee extensors.  0 ankle dorsiflexor plantar flexor as well as inverters and everters. Ambulates without assistive device there is hip hiking left lower limb no evidence of knee instability      Assessment & Plan:   1.  History of right CVA with left spastic hemiplegia.  Most significant spasticity left wrist flexors.  At this point patient is not pursuing any more aggressive treatment in terms of spasticity Continue outpatient physical therapy Patient is having a left AFO fabricated which should be helpful in reducing energy requirements of ambulation. Would like to see the patient back once this is fabricated to see how he is doing with this. I will see him back in approximately 1 month Discussed with patient and his mother.

## 2022-08-01 ENCOUNTER — Encounter: Payer: Self-pay | Admitting: Family Medicine

## 2022-08-01 ENCOUNTER — Ambulatory Visit: Payer: Medicaid Other | Attending: Family Medicine | Admitting: Family Medicine

## 2022-08-01 VITALS — BP 131/74 | HR 79 | Ht 65.0 in | Wt 159.4 lb

## 2022-08-01 DIAGNOSIS — I129 Hypertensive chronic kidney disease with stage 1 through stage 4 chronic kidney disease, or unspecified chronic kidney disease: Secondary | ICD-10-CM

## 2022-08-01 DIAGNOSIS — E1122 Type 2 diabetes mellitus with diabetic chronic kidney disease: Secondary | ICD-10-CM | POA: Diagnosis not present

## 2022-08-01 DIAGNOSIS — E1169 Type 2 diabetes mellitus with other specified complication: Secondary | ICD-10-CM | POA: Diagnosis not present

## 2022-08-01 DIAGNOSIS — N184 Chronic kidney disease, stage 4 (severe): Secondary | ICD-10-CM | POA: Diagnosis not present

## 2022-08-01 DIAGNOSIS — K2100 Gastro-esophageal reflux disease with esophagitis, without bleeding: Secondary | ICD-10-CM

## 2022-08-01 LAB — POCT GLYCOSYLATED HEMOGLOBIN (HGB A1C): HbA1c, POC (controlled diabetic range): 6.4 % (ref 0.0–7.0)

## 2022-08-01 MED ORDER — OMEPRAZOLE 40 MG PO CPDR: 40.0000 mg | DELAYED_RELEASE_CAPSULE | Freq: Every day | ORAL | 1 refills | Status: DC

## 2022-08-01 MED ORDER — GLIPIZIDE 5 MG PO TABS: 5.0000 mg | ORAL_TABLET | Freq: Two times a day (BID) | ORAL | 1 refills | Status: DC

## 2022-08-01 MED ORDER — CARVEDILOL 25 MG PO TABS: 25.0000 mg | ORAL_TABLET | Freq: Two times a day (BID) | ORAL | 1 refills | Status: DC

## 2022-08-01 MED ORDER — ISOSORBIDE MONONITRATE ER 30 MG PO TB24: 30.0000 mg | ORAL_TABLET | Freq: Every day | ORAL | 1 refills | Status: DC

## 2022-08-01 MED ORDER — HYDRALAZINE HCL 100 MG PO TABS: 100.0000 mg | ORAL_TABLET | Freq: Three times a day (TID) | ORAL | 1 refills | Status: DC

## 2022-08-01 MED ORDER — AMLODIPINE BESYLATE 10 MG PO TABS: 10.0000 mg | ORAL_TABLET | Freq: Every day | ORAL | 1 refills | Status: DC

## 2022-08-01 NOTE — Progress Notes (Signed)
Subjective:  Patient ID: Tony Schmitt, male    DOB: [birth year]  Age: 51 y.o. MRN: [id number]  CC: Diabetes   HPI Tony Schmitt is a 51 y.o. year old male with a history of hypertension, stage IV CKD, type 2 diabetes mellitus (A1c 6.4),acute/subacute nonhemorrhagic infarct in the posterior right corona radiata and anterior aspect of left internal capsule with residual left hemiparesis who presents for a follow up visit   Interval History: Endorses keeping his appointments with nephrology. He has been placed on the renal transplant list at [facility]; he is not yet on renal replacement therapy.  With regards to his diabetes mellitus he has no hypoglycemia, A1c 6.4 and he is doing well on glipizide.  He is not up-to-date on annual eye exam.  Denies presence of neuropathy. Endorses adherence with his antihypertensive and his statin.  He does have residual left hemiparesis and ambulates with a cane.  He is undergoing physical therapy for his left upper extremity weakness and his rehab medicine physician is working with him to obtain an AFO brace for his left lower extremity. Past Medical History:  Diagnosis Date   Chronic kidney disease    Diabetes mellitus without complication ([place])    Hypertension    Stroke [facility])     Past Surgical History:  Procedure Laterality Date   BUBBLE STUDY  04/18/2020   Procedure: BUBBLE STUDY;  Surgeon: Sueanne Margarita, MD;  Location: [place];  Service: Cardiovascular;;   TEE WITHOUT CARDIOVERSION N/A 04/18/2020   Procedure: TRANSESOPHAGEAL ECHOCARDIOGRAM (TEE);  Surgeon: Sueanne Margarita, MD;  Location: [facility] ENDOSCOPY;  Service: Cardiovascular;  Laterality: N/A;    Family History  Problem Relation Age of Onset   Diabetes Mother    Diabetes Father    Hypertension Father    Cancer Father    Heart failure Other     Social History   Socioeconomic History   Marital status: Single    Spouse name: Not on file   Number of children: Not on  file   Years of education: Not on file   Highest education level: Not on file  Occupational History   Not on file  Tobacco Use   Smoking status: Former    Types: Cigarettes, Cigars   Smokeless tobacco: Never  Vaping Use   Vaping Use: Never used  Substance and Sexual Activity   Alcohol use: Not Currently   Drug use: Never   Sexual activity: Not on file  Other Topics Concern   Not on file  Social History Narrative   Not on file   Social Determinants of Health   Financial Resource Strain: Not on file  Food Insecurity: Not on file  Transportation Needs: Not on file  Physical Activity: Not on file  Stress: Not on file  Social Connections: Not on file    Allergies  Allergen Reactions   Latex Itching    Outpatient Medications Prior to Visit  Medication Sig Dispense Refill   acetaminophen (TYLENOL) 325 MG tablet Take 2 tablets (650 mg total) by mouth every 4 (four) hours as needed for mild pain (or temp > 37.5 C (99.5 F)).     aspirin 81 MG chewable tablet Chew 1 tablet (81 mg total) by mouth daily. 30 tablet 0   cloNIDine (CATAPRES) 0.1 MG tablet Take 0.1 mg by mouth at bedtime.     furosemide (LASIX) 80 MG tablet Take 1 tablet by mouth once daily 90 tablet 0   rosuvastatin (  CRESTOR) 10 MG tablet Take 1 tablet (10 mg total) by mouth daily. 90 tablet 1   sodium bicarbonate 650 MG tablet Take 2 tablets by mouth twice daily 120 tablet 2   amLODipine (NORVASC) 10 MG tablet Take 1 tablet (10 mg total) by mouth daily. 90 tablet 1   atorvastatin (LIPITOR) 80 MG tablet Take 80 mg by mouth daily.     carvedilol (COREG) 25 MG tablet Take 1 tablet (25 mg total) by mouth 2 (two) times daily with a meal. 180 tablet 1   glipiZIDE (GLUCOTROL) 5 MG tablet Take 1 tablet (5 mg total) by mouth 2 (two) times daily before a meal. 180 tablet 1   hydrALAZINE (APRESOLINE) 100 MG tablet Take 1 tablet (100 mg total) by mouth 3 (three) times daily. 270 tablet 1   isosorbide mononitrate (IMDUR) 30 MG 24  hr tablet Take 1 tablet (30 mg total) by mouth daily. 90 tablet 1   omeprazole (PRILOSEC) 40 MG capsule Take 1 capsule by mouth once daily 30 capsule 0   No facility-administered medications prior to visit.     ROS Review of Systems  Constitutional:  Negative for activity change and appetite change.  HENT:  Negative for sinus pressure and sore throat.   Respiratory:  Negative for chest tightness, shortness of breath and wheezing.   Cardiovascular:  Negative for chest pain and palpitations.  Gastrointestinal:  Negative for abdominal distention, abdominal pain and constipation.  Genitourinary: Negative.   Musculoskeletal: Negative.   Neurological:  Positive for weakness.  Psychiatric/Behavioral:  Negative for behavioral problems and dysphoric mood.     Objective:  BP 131/74   Pulse 79   Ht 5\' 5"  (1.651 m)   Wt 159 lb 6.4 oz (72.3 kg)   SpO2 98%   BMI 26.53 kg/m      08/01/2022    2:25 PM 08/01/2022    2:01 PM 07/31/2022   12:38 PM  BP/Weight  Systolic BP 193 790 240  Diastolic BP 74 74 81  Wt. (Lbs)  159.4 160  BMI  26.53 kg/m2 26.63 kg/m2      Physical Exam Constitutional:      Appearance: He is well-developed.  Cardiovascular:     Rate and Rhythm: Normal rate.     Heart sounds: Normal heart sounds. No murmur heard. Pulmonary:     Effort: Pulmonary effort is normal.     Breath sounds: Normal breath sounds. No wheezing or rales.  Chest:     Chest wall: No tenderness.  Abdominal:     General: Bowel sounds are normal. There is no distension.     Palpations: Abdomen is soft. There is no mass.     Tenderness: There is no abdominal tenderness.  Musculoskeletal:        General: Normal range of motion.     Right lower leg: No edema.     Left lower leg: No edema.  Neurological:     Mental Status: He is alert and oriented to person, place, and time.     Comments: Left spastic hemiparesis  Psychiatric:        Mood and Affect: Mood normal.        Latest Ref  Rng & Units 07/20/2022    1:34 PM 06/22/2022    1:38 PM 05/25/2022    1:36 PM  CMP  Glucose 70 - 99 mg/dL 113  107  195   BUN 6 - 20 mg/dL 59  38  46   Creatinine  0.61 - 1.24 mg/dL 4.98  3.82  4.75   Sodium 135 - 145 mmol/L 140  142  141   Potassium 3.5 - 5.1 mmol/L 3.8  3.4  3.7   Chloride 98 - 111 mmol/L 108  111  104   CO2 22 - 32 mmol/L 21  23  23    Calcium 8.9 - 10.3 mg/dL 9.8  9.9  9.9     Lipid Panel     Component Value Date/Time   CHOL 227 (H) 02/07/2022 0955   TRIG 155 (H) 02/07/2022 0955   HDL 58 02/07/2022 0955   CHOLHDL 17.5 04/16/2020 0558   VLDL 60 (H) 04/16/2020 0558   LDLCALC 141 (H) 02/07/2022 0955    CBC    Component Value Date/Time   WBC 9.7 07/20/2022 1334   RBC 4.38 07/20/2022 1334   HGB 12.0 (L) 07/20/2022 1337   HGB 9.8 (L) 06/07/2020 1551   HCT 36.4 (L) 07/20/2022 1334   HCT 30.3 (L) 06/07/2020 1551   PLT 356 07/20/2022 1334   PLT 471 (H) 06/07/2020 1551   MCV 83.1 07/20/2022 1334   MCV 84 06/07/2020 1551   MCH 26.7 07/20/2022 1334   MCHC 32.1 07/20/2022 1334   RDW 15.6 (H) 07/20/2022 1334   RDW 14.1 06/07/2020 1551   LYMPHSABS 2.2 07/20/2022 1334   LYMPHSABS 1.8 06/07/2020 1551   MONOABS 0.6 07/20/2022 1334   EOSABS 0.3 07/20/2022 1334   EOSABS 0.2 06/07/2020 1551   BASOSABS 0.1 07/20/2022 1334   BASOSABS 0.1 06/07/2020 1551    Lab Results  Component Value Date   HGBA1C 6.4 08/01/2022    Assessment & Plan:  1. Type 2 diabetes mellitus with other specified complication, without long-term current use of insulin (HCC) Controlled with A1c of 6.4 Continue current regimen Counseled on Diabetic diet, my plate method, 409 minutes of moderate intensity exercise/week Blood sugar logs with fasting goals of 80-120 mg/dl, random of less than 180 and in the event of sugars less than 60 mg/dl or greater than 400 mg/dl encouraged to notify the clinic. Advised on the need for annual eye exams, annual foot exams, Pneumonia vaccine. - POCT  glycosylated hemoglobin (Hb A1C) - Ambulatory referral to Ophthalmology - LP+Non-HDL Cholesterol - glipiZIDE (GLUCOTROL) 5 MG tablet; Take 1 tablet (5 mg total) by mouth 2 (two) times daily before a meal.  Dispense: 180 tablet; Refill: 1  2. Hypertension in stage 4 chronic kidney disease due to type 2 diabetes mellitus ([place]) Controlled Continue current regimen Counseled on blood pressure goal of less than 130/80, low-sodium, DASH diet, medication compliance, 150 minutes of moderate intensity exercise per week. Discussed medication compliance, adverse effects. - amLODipine (NORVASC) 10 MG tablet; Take 1 tablet (10 mg total) by mouth daily.  Dispense: 90 tablet; Refill: 1 - carvedilol (COREG) 25 MG tablet; Take 1 tablet (25 mg total) by mouth 2 (two) times daily with a meal.  Dispense: 180 tablet; Refill: 1 - hydrALAZINE (APRESOLINE) 100 MG tablet; Take 1 tablet (100 mg total) by mouth 3 (three) times daily.  Dispense: 270 tablet; Refill: 1 - isosorbide mononitrate (IMDUR) 30 MG 24 hr tablet; Take 1 tablet (30 mg total) by mouth daily.  Dispense: 90 tablet; Refill: 1  3. Gastroesophageal reflux disease with esophagitis without hemorrhage Controlled Advised to avoid recumbency up to 2 hours postmeal, avoid late meals, avoid foods that trigger symptoms. - omeprazole (PRILOSEC) 40 MG capsule; Take 1 capsule (40 mg total) by mouth daily.  Dispense: 90 capsule;  Refill: 1   Meds ordered this encounter  Medications   amLODipine (NORVASC) 10 MG tablet    Sig: Take 1 tablet (10 mg total) by mouth daily.    Dispense:  90 tablet    Refill:  1   carvedilol (COREG) 25 MG tablet    Sig: Take 1 tablet (25 mg total) by mouth 2 (two) times daily with a meal.    Dispense:  180 tablet    Refill:  1   glipiZIDE (GLUCOTROL) 5 MG tablet    Sig: Take 1 tablet (5 mg total) by mouth 2 (two) times daily before a meal.    Dispense:  180 tablet    Refill:  1   hydrALAZINE (APRESOLINE) 100 MG tablet    Sig:  Take 1 tablet (100 mg total) by mouth 3 (three) times daily.    Dispense:  270 tablet    Refill:  1   omeprazole (PRILOSEC) 40 MG capsule    Sig: Take 1 capsule (40 mg total) by mouth daily.    Dispense:  90 capsule    Refill:  1   isosorbide mononitrate (IMDUR) 30 MG 24 hr tablet    Sig: Take 1 tablet (30 mg total) by mouth daily.    Dispense:  90 tablet    Refill:  1    Follow-up: Return in about 6 months (around 01/31/2023) for Chronic medical conditions, Medication reconciliation with Lurena Joiner.       Charlott Rakes, MD, FAAFP. [facility] [place] [facility] [place], [place]   08/01/2022, 2:26 PM

## 2022-08-02 ENCOUNTER — Ambulatory Visit (HOSPITAL_BASED_OUTPATIENT_CLINIC_OR_DEPARTMENT_OTHER): Payer: Medicaid Other | Admitting: Physical Therapy

## 2022-08-02 ENCOUNTER — Encounter (HOSPITAL_BASED_OUTPATIENT_CLINIC_OR_DEPARTMENT_OTHER): Payer: Self-pay | Admitting: Physical Therapy

## 2022-08-02 ENCOUNTER — Other Ambulatory Visit: Payer: Self-pay | Admitting: Family Medicine

## 2022-08-02 DIAGNOSIS — R262 Difficulty in walking, not elsewhere classified: Secondary | ICD-10-CM

## 2022-08-02 DIAGNOSIS — M6281 Muscle weakness (generalized): Secondary | ICD-10-CM

## 2022-08-02 DIAGNOSIS — R2681 Unsteadiness on feet: Secondary | ICD-10-CM

## 2022-08-02 LAB — LP+NON-HDL CHOLESTEROL
Cholesterol, Total: 225 mg/dL — ABNORMAL HIGH (ref 100–199)
HDL: 47 mg/dL (ref >39)
LDL Chol Calc (NIH): 149 mg/dL — ABNORMAL HIGH (ref 0–99)
Total Non-HDL-Chol (LDL+VLDL): 178 mg/dL — ABNORMAL HIGH (ref 0–129)
Triglycerides: 158 mg/dL — ABNORMAL HIGH (ref 0–149)
VLDL Cholesterol Cal: 29 mg/dL (ref 5–40)

## 2022-08-02 MED ORDER — ROSUVASTATIN CALCIUM 20 MG PO TABS: 10.0000 mg | ORAL_TABLET | Freq: Every day | ORAL | 1 refills | Status: DC

## 2022-08-02 MED ORDER — ROSUVASTATIN CALCIUM 20 MG PO TABS: 20.0000 mg | ORAL_TABLET | Freq: Every day | ORAL | 1 refills | Status: DC

## 2022-08-02 NOTE — Therapy (Signed)
OUTPATIENT PHYSICAL THERAPY NEURO TREATMENT, Progress Note and Recertification   Patient Name: Tony Schmitt MRN: [id number] DOB:August 13, 1970, 51 y.o., male Today's Date: 08/02/2022   PCP: Charlott Rakes, MD REFERRING PROVIDER: Charlett Blake, MD       PT End of Session - 08/02/22 1450     Visit Number 11    Number of Visits 14    Date for PT Re-Evaluation 08/13/22    Authorization Type  Medicaid [employer]    Authorization Time Period 20 VL    Authorization - Number of Visits 27    Progress Note Due on Visit 18    PT Start Time 1402    PT Stop Time 1445    PT Time Calculation (min) 43 min    Activity Tolerance Patient tolerated treatment well    Behavior During Therapy Flat affect;WFL for tasks assessed/performed                Past Medical History:  Diagnosis Date   Chronic kidney disease    Diabetes mellitus without complication ([place])    Hypertension    Stroke [facility])    Past Surgical History:  Procedure Laterality Date   BUBBLE STUDY  04/18/2020   Procedure: BUBBLE STUDY;  Surgeon: Sueanne Margarita, MD;  Location: [facility];  Service: Cardiovascular;;   TEE WITHOUT CARDIOVERSION N/A 04/18/2020   Procedure: TRANSESOPHAGEAL ECHOCARDIOGRAM (TEE);  Surgeon: Sueanne Margarita, MD;  Location: [facility] ENDOSCOPY;  Service: Cardiovascular;  Laterality: N/A;   Patient Active Problem List   Diagnosis Date Noted   Acquired left foot drop 05/22/2022   Nephrotic syndrome    Peripheral edema    Abnormality of gait 05/23/2020   Type 2 diabetes mellitus with hyperglycemia, without long-term current use of insulin (HCC)    Thrombocytosis    Benign essential HTN    Acute blood loss anemia    Leukocytosis    Slow transit constipation    AKI (acute kidney injury) ([place])    Diabetes mellitus type 2 with complications, uncontrolled    CVA (cerebral vascular accident) ([place]) 04/22/2020   Acute left-sided weakness    Tobacco abuse    Essential hypertension    Acute CVA  (cerebrovascular accident) ([place]) 04/16/2020   Hypertensive urgency 04/16/2020   Type 2 diabetes mellitus with vascular disease ([place]) 04/16/2020   CKD (chronic kidney disease) stage 3, GFR 30-59 ml/min ([place]) 04/23/2019   Hyperlipidemia 04/18/2019   Dyslipidemia associated with type 2 diabetes mellitus ([place]) 08/29/2018   Hypertension associated with type 2 diabetes mellitus ([place]) 08/29/2018    ONSET DATE: 04/16/20  REFERRING DIAG: B16.606 (ICD-10-CM) - Acquired left foot drop G81.10 (ICD-10-CM) - Spastic hemiplegia, unspecified etiology, unspecified laterality   THERAPY DIAG:  Difficulty in walking, not elsewhere classified  Muscle weakness (generalized)  Unsteadiness on feet  Rationale for Evaluation and Treatment Rehabilitation  SUBJECTIVE:  SUBJECTIVE STATEMENT: Pt without complaint.    Pt accompanied by: family member (mother)  PERTINENT HISTORY: CKD, DM, HTN, stroke  PAIN:  Are you having pain? No  PRECAUTIONS: None  WEIGHT BEARING RESTRICTIONS No  FALLS: Has patient fallen in last 6 months? No  LIVING ENVIRONMENT: Lives with: lives with their family Lives in: House/apartment, one-level Stairs: Yes: External: 2 steps; none Has following equipment at home: Single point cane and hurrycane  PLOF: Independent with basic ADLs, Independent with household mobility with device, and uses manual wheelchair for community distances (someone pushes)  PATIENT GOALS be able to hold stuff in left hand  OBJECTIVE:    TODAY'S TREATMENT:   08/02/22 Aquatic Pt seen for aquatic therapy today.  Treatment took place in water 3.25-4.5 ft in depth at the [facility]. Temp of water was 92.  Pt entered/exited the pool via stairs using step to pattern with hand rail.   *Walking  forward back and side stepping in 63f ue support yellow hand buoys *attempted grapevine (too difficult). Completed step over R/L x 2 widths *running forward and back x 6 widths ea then fast side stepping x 6 widths.   *reactive balance tasks: 4 square R/L added  mirroring then verbally calling direction *STS from 3rd step (bottom) x 5 then 4 th step x 10 Cues for immediate standing balance. Good challenge *stair tapping in 3.6 ft with ue support of LUE on yellow hand buoy *Forward/backward amb  with yellow hand buoy submerged at hip for increased left core engagement *3 way tap beginning L then R x10  *squats in 3 ft x 10 then on bottom step for increased challenge of strength and balance x 10 *Core strengthening for stability: Standing wide stance RUE bell oscillations in frontal and sagittal planes slow then fast intervals 3 x10-15 ea. *Standing ue support on hand buoy: kick through for balance.  (Very difficult) *Left hip flex and quad stretch on bottom step 20s hold x 3   Pt requires the buoyancy and hydrostatic pressure of water for support, and to offload joints by unweighting joint load by at least 50 % in navel deep water and by at least 75-80% in chest to neck deep water.  Viscosity of the water is needed for resistance of strengthening. Water current perturbations provides challenge to standing balance requiring increased core activation.  TODAY'S TREATMENT: 07/02/22 Activity Comments  TUG test 1) with cane: 26.56 sec 2) w/ cane and AFO: 21.18 sec  Gait speed: 10 meterWT 1) with cane; 23.75 sec= 1.38 ft/sec 2) with cane and AFO; 22 sec = 1.49 ft/sec  Pt education in gym equipment Leg press, leg extension, seated hamstring curls  Aerobic step  -forward step/lunge 1x10 -sidestep and lunge 1x10 -forward step-up 1x10 -forward step down LLE 1x10 (4" riser)  Standing gastroc stretch 2 min           HOME EXERCISE PROGRAM: Access Code: [email or web address]:  [email or web address] Date: 07/02/2022 Prepared by: KSherlyn Lees Exercises - Step Up  - 1 x daily - 7 x weekly - 1-3 sets - 10 reps - Lateral Step Up  - 1 x daily - 7 x weekly - 1-3 sets - 10 reps - Forward Step Down  - 1 x daily - 7 x weekly - 1-3 sets - 10 reps  DIAGNOSTIC FINDINGS: imaging studies at time of CVA  COGNITION: Overall cognitive status: Within functional limits for tasks assessed   SENSATION: Light touch: Impaired  LUE/LLE  COORDINATION: Grossly impaired LUE/LLE  EDEMA:  Minimal in LE, Left hand   MUSCLE TONE: LLE: Flaccid   MUSCLE LENGTH: Left achilles tightness. Lacking 5-10 degrees for plantigrade. With seated soleus stretch (left foot met heads positioned edge of 2" step) lacking 1.5 inches for heel contact  DTRs:    POSTURE: weight shifts right  LOWER EXTREMITY ROM:     Active  Right Eval Left Eval  Hip flexion    Hip extension    Hip abduction    Hip adduction    Hip internal rotation    Hip external rotation    Knee flexion    Knee extension    Ankle dorsiflexion 15 -10  Ankle plantarflexion    Ankle inversion    Ankle eversion     (Blank rows = not tested)  LOWER EXTREMITY MMT:    MMT Right Eval Left Eval  Hip flexion 5 3-  Hip extension    Hip abduction (in sitting) 5 4-  Hip adduction (in sitting) 5 4  Hip internal rotation    Hip external rotation    Knee flexion 5 4-  Knee extension 5 4  Ankle dorsiflexion 5 0  Ankle plantarflexion 4 0  Ankle inversion 5 0  Ankle eversion 5 0  (Blank rows = not tested)  BED MOBILITY:  NT  TRANSFERS: Assistive device utilized: Single point cane  Sit to stand: Complete Independence Stand to sit: Complete Independence and Modified independence Chair to chair: Complete Independence and Modified independence Floor:  NT  RAMP:  NT  CURB:  Level of Assistance: SBA and CGA Assistive device utilized: Single point cane Curb Comments: must lead up with RLE and  descend w/ LLE  STAIRS:  Level of Assistance: Modified independence  Stair Negotiation Technique: Step to Pattern with Single Rail on Right or cane  Number of Stairs: 4   Height of Stairs: 6  Comments: turns slightly sideways  GAIT: Gait pattern: decreased stride length, circumduction- Left, Left foot flat, abducted- Left, and poor foot clearance- Left Distance walked: 80 ft Assistive device utilized: Single point cane Level of assistance: Modified independence Comments: level surfaces  FUNCTIONAL TESTs:  5 times sit to stand: 13.90 sec Timed up and go (TUG): 26 sec w/ HUrrycane 10 meter walk test: 1.3 ft/sec Berg Balance Scale: 45/56  PATIENT SURVEYS:  NT    PATIENT EDUCATION: Education details: see above Person educated: Patient and Parent Education method: Customer service manager Education comprehension: verbalized understanding     GOALS: Goals reviewed with patient? Yes  SHORT TERM GOALS: Target date: 06/12/2022  Patient will be independent in HEP to improve functional outcomes Baseline: initiated land-based activities Goal status: on-going; to now include aquatic interventions/HEP  2.  Pt to be able to don/doff left AFO to improve functional independence and safety with transfers and gait Baseline: N/A. Still awaiting arrival of device from [employer] Goal status: on-going    LONG TERM GOALS: Target date: 07/10/2022  Demonstrate improved safety and speed of ambulation per time 18 sec TUG test to reduce risk for falls Baseline: 26 sec; (07/02/22) 26 sec Goal status: On-going  2.  Demonstrate improved efficiency of gait by ambulating with speed of 2.0 ft/sec for increased independence in community Baseline: 1.3 ft/sec; (07/02/22) 1.38 ft/sec w/ cane Goal status: On-going   ASSESSMENT:  CLINICAL IMPRESSION: Further advanced balance/core strengthening tasks which he tolerates well as well as enjoys but has difficulty with.  He gives good effort without  becoming frustrated.  He is unable to complete grapevine but coordinates stepping forward over foot well once motor plan established.  Goals ongoing   Re-assessment impression: Treatment session today with emphasis on re-assessment and note that no change with gait speed or TUG test time (fall risk) since start of care.  Use of left AFO obviously improves gait speed and quality of gait cycle as well as once left AFO is applied pt exhibits decrease in circumduction and able to increase gait speed as evident by 10 meter walk test and TUG test times. This therapist has facilitated acquisition of AFO and relevant intervention and is awaiting device to be delivered/furnished by local [employer] vendor.  Patient would benefit from continued sessions to advance POC details and further develop and refine HEP which will include aquatic-based interventions as pt has been able to obtain membership to facility for continued, on-going aquatic activities for HEP development.      OBJECTIVE IMPAIRMENTS Abnormal gait, decreased activity tolerance, decreased balance, decreased coordination, decreased knowledge of condition, decreased knowledge of use of DME, difficulty walking, decreased ROM, decreased strength, impaired flexibility, and impaired tone.   ACTIVITY LIMITATIONS carrying, standing, stairs, transfers, and locomotion level  PARTICIPATION LIMITATIONS: cleaning, shopping, community activity, and yard work  PERSONAL FACTORS Time since onset of injury/illness/exacerbation and 1-2 comorbidities: DM, HTN, CKD  are also affecting patient's functional outcome.   REHAB POTENTIAL: Good  CLINICAL DECISION MAKING: Stable/uncomplicated  EVALUATION COMPLEXITY: Low  PLAN: PT FREQUENCY: 1x/week  PT DURATION: 8 weeks  PLANNED INTERVENTIONS: Therapeutic exercises, Therapeutic activity, Neuromuscular re-education, Balance training, Gait training, Patient/Family education, Self Care, Joint mobilization, Stair training,  Vestibular training, Canalith repositioning, Orthotic/Fit training, DME instructions, Aquatic Therapy, Dry Needling, Electrical stimulation, Wheelchair mobility training, Taping, and Manual therapy  PLAN FOR NEXT SESSION:aquatic HEP development    2:50 PM, 08/02/22 Annamarie Major) [place] MPT

## 2022-08-03 ENCOUNTER — Ambulatory Visit (HOSPITAL_COMMUNITY)
Admission: RE | Admit: 2022-08-03 | Discharge: 2022-08-03 | Disposition: A | Discharge status: Home / Self Care | Payer: Medicaid Other | Source: Ambulatory Visit | Attending: Nephrology | Admitting: Nephrology

## 2022-08-03 ENCOUNTER — Ambulatory Visit: Payer: Self-pay | Admitting: *Deleted

## 2022-08-03 VITALS — BP 135/72 | HR 77 | Temp 97.2°F | Resp 18

## 2022-08-03 DIAGNOSIS — N183 Chronic kidney disease, stage 3 unspecified: Secondary | ICD-10-CM | POA: Diagnosis not present

## 2022-08-03 LAB — POCT HEMOGLOBIN-HEMACUE: Hemoglobin: 10.5 g/dL — ABNORMAL LOW (ref 13.0–17.0)

## 2022-08-03 MED ORDER — EPOETIN ALFA-EPBX 10000 UNIT/ML IJ SOLN
20000.0000 [IU] | INTRAMUSCULAR | Status: DC
  Administered 2022-08-03: 20000 [IU] via SUBCUTANEOUS

## 2022-08-03 MED ORDER — EPOETIN ALFA-EPBX 10000 UNIT/ML IJ SOLN
INTRAMUSCULAR | Status: AC
  Filled 2022-08-03: qty 2

## 2022-08-03 NOTE — Telephone Encounter (Signed)
Patient's mother called to request which medication injection was being increased. Reports she just received a call that a medication injection would be increased for patient. Patient's mother making sure she doesn't need to adjust any current medications. In review of medications no changes noted from PCP. Unknown medication injection 15 noted from encounter from Donato Heinz, MD. Can not display medication because the patient  has not yet been checked in . Please advise if any medications need to be changed.     Reason for Disposition  [1] Caller has medicine question about med NOT prescribed by PCP AND [2] triager unable to answer question (e.g., compatibility with other med, storage)  Answer Assessment - Initial Assessment Questions 1. NAME of MEDICINE: "What medicine(s) are you calling about?"     Unsure patient's mother reports change in injection to increase dose but does not know name of medication. 2. QUESTION: "What is your question?" (e.g., double dose of medicine, side effect)     Does patient's mother need to change any medications oral for patient?  3. PRESCRIBER: "Who prescribed the medicine?" Reason: if prescribed by specialist, call should be referred to that group.     Unknown  4. SYMPTOMS: "Do you have any symptoms?" If Yes, ask: "What symptoms are you having?"  "How bad are the symptoms (e.g., mild, moderate, severe)     na 5. PREGNANCY:  "Is there any chance that you are pregnant?" "When was your last menstrual period?"     na  Protocols used: Medication Question Call-A-AH

## 2022-08-03 NOTE — Telephone Encounter (Signed)
Mother called and VM was left informing her to return phone call.

## 2022-08-09 ENCOUNTER — Encounter (HOSPITAL_BASED_OUTPATIENT_CLINIC_OR_DEPARTMENT_OTHER): Payer: Self-pay | Admitting: Physical Therapy

## 2022-08-09 ENCOUNTER — Ambulatory Visit (HOSPITAL_BASED_OUTPATIENT_CLINIC_OR_DEPARTMENT_OTHER): Payer: Medicaid Other | Attending: Physical Medicine & Rehabilitation | Admitting: Physical Therapy

## 2022-08-09 DIAGNOSIS — M6281 Muscle weakness (generalized): Secondary | ICD-10-CM | POA: Insufficient documentation

## 2022-08-09 DIAGNOSIS — R2681 Unsteadiness on feet: Secondary | ICD-10-CM | POA: Diagnosis present

## 2022-08-09 DIAGNOSIS — R262 Difficulty in walking, not elsewhere classified: Secondary | ICD-10-CM | POA: Insufficient documentation

## 2022-08-09 NOTE — Therapy (Signed)
OUTPATIENT PHYSICAL THERAPY NEURO TREATMENT,    Patient Name: Tony Schmitt MRN: [id number] DOB:1971-01-28, 52 y.o., male Today's Date: 08/09/2022   PCP: Charlott Rakes, MD REFERRING PROVIDER: Charlett Blake, MD       PT End of Session - 08/09/22 0945     Visit Number 12    Number of Visits 14    Date for PT Re-Evaluation 08/13/22    Authorization Type [employer]    Authorization Time Period 9 VL    Authorization - Number of Visits 27    Progress Note Due on Visit 18    PT Start Time [id number]    PT Stop Time 1015   pt had to leave early for another appointment   PT Time Calculation (min) 33 min    Activity Tolerance Patient tolerated treatment well    Behavior During Therapy Flat affect;WFL for tasks assessed/performed                Past Medical History:  Diagnosis Date   Chronic kidney disease    Diabetes mellitus without complication ([place])    Hypertension    Stroke [facility])    Past Surgical History:  Procedure Laterality Date   BUBBLE STUDY  04/18/2020   Procedure: BUBBLE STUDY;  Surgeon: Sueanne Margarita, MD;  Location: [place];  Service: Cardiovascular;;   TEE WITHOUT CARDIOVERSION N/A 04/18/2020   Procedure: TRANSESOPHAGEAL ECHOCARDIOGRAM (TEE);  Surgeon: Sueanne Margarita, MD;  Location: [facility] ENDOSCOPY;  Service: Cardiovascular;  Laterality: N/A;   Patient Active Problem List   Diagnosis Date Noted   Acquired left foot drop 05/22/2022   Nephrotic syndrome    Peripheral edema    Abnormality of gait 05/23/2020   Type 2 diabetes mellitus with hyperglycemia, without long-term current use of insulin (HCC)    Thrombocytosis    Benign essential HTN    Acute blood loss anemia    Leukocytosis    Slow transit constipation    AKI (acute kidney injury) ([place])    Diabetes mellitus type 2 with complications, uncontrolled    CVA (cerebral vascular accident) ([place]) 04/22/2020   Acute left-sided weakness    Tobacco abuse    Essential hypertension    Acute  CVA (cerebrovascular accident) ([place]) 04/16/2020   Hypertensive urgency 04/16/2020   Type 2 diabetes mellitus with vascular disease ([place]) 04/16/2020   CKD (chronic kidney disease) stage 3, GFR 30-59 ml/min ([place]) 04/23/2019   Hyperlipidemia 04/18/2019   Dyslipidemia associated with type 2 diabetes mellitus ([place]) 08/29/2018   Hypertension associated with type 2 diabetes mellitus ([place]) 08/29/2018    ONSET DATE: 04/16/20  REFERRING DIAG: H18.343 (ICD-10-CM) - Acquired left foot drop G81.10 (ICD-10-CM) - Spastic hemiplegia, unspecified etiology, unspecified laterality   THERAPY DIAG:  Difficulty in walking, not elsewhere classified  Muscle weakness (generalized)  Unsteadiness on feet  Rationale for Evaluation and Treatment Rehabilitation  SUBJECTIVE:  SUBJECTIVE STATEMENT: "Getting on a list for a kidney transplant"  Pt accompanied by: family member (mother)  PERTINENT HISTORY: CKD, DM, HTN, stroke  PAIN:  Are you having pain? No  PRECAUTIONS: None  WEIGHT BEARING RESTRICTIONS No  FALLS: Has patient fallen in last 6 months? No  LIVING ENVIRONMENT: Lives with: lives with their family Lives in: House/apartment, one-level Stairs: Yes: External: 2 steps; none Has following equipment at home: Single point cane and hurrycane  PLOF: Independent with basic ADLs, Independent with household mobility with device, and uses manual wheelchair for community distances (someone pushes)  PATIENT GOALS be able to hold stuff in left hand  OBJECTIVE:    TODAY'S TREATMENT:   08/09/22 Aquatic Pt seen for aquatic therapy today.  Treatment took place in water 3.25-4.5 ft in depth at the [place]. Temp of water was 92.  Pt entered/exited the pool via stairs using step to pattern with hand  rail.   *Walking forward back and side stepping in 42f ue support yellow hand buoys *attempted [place] (too difficult). Completed step over R/L x 2 widths *running forward and back x 6 widths ea then fast side stepping x 6 widths.   *STS from 3rd step (bottom) x 5 then 4 th step x 10 Cues for immediate standing balance. Pt completes with ease *stair tapping in 3.6 ft with ue support of LUE on yellow hand buoy *Forward/backward and side stepping amb  2 widths ea with yellow hand buoy submerged at hip for increased left core engagement *squats in 3 ft x 10 then on bottom step for increased challenge of strength and balance x 10 *Standing ue support on hand buoy: kick through for balance.  (Improved) *Left hip flex and quad stretch on bottom step 20s hold x 3   Pt requires the buoyancy and hydrostatic pressure of water for support, and to offload joints by unweighting joint load by at least 50 % in navel deep water and by at least 75-80% in chest to neck deep water.  Viscosity of the water is needed for resistance of strengthening. Water current perturbations provides challenge to standing balance requiring increased core activation.  TODAY'S TREATMENT: 07/02/22 Activity Comments  TUG test 1) with cane: 26.56 sec 2) w/ cane and AFO: 21.18 sec  Gait speed: 10 meterWT 1) with cane; 23.75 sec= 1.38 ft/sec 2) with cane and AFO; 22 sec = 1.49 ft/sec  Pt education in gym equipment Leg press, leg extension, seated hamstring curls  Aerobic step  -forward step/lunge 1x10 -sidestep and lunge 1x10 -forward step-up 1x10 -forward step down LLE 1x10 (4" riser)  Standing gastroc stretch 2 min           HOME EXERCISE PROGRAM: Access Code: [email or web address]: [email or web address] Date: 07/02/2022 Prepared by: KSherlyn Lees Exercises - Step Up  - 1 x daily - 7 x weekly - 1-3 sets - 10 reps - Lateral Step Up  - 1 x daily - 7 x weekly - 1-3 sets - 10 reps - Forward Step Down  - 1 x  daily - 7 x weekly - 1-3 sets - 10 reps  Date: 08/09/2022 Added aquatics Prepared by: FDenton Meek DIAGNOSTIC FINDINGS: imaging studies at time of CVA  COGNITION: Overall cognitive status: Within functional limits for tasks assessed   SENSATION: Light touch: Impaired  LUE/LLE  COORDINATION: Grossly impaired LUE/LLE  EDEMA:  Minimal in LE, Left hand   MUSCLE TONE: LLE: Flaccid   MUSCLE LENGTH: Left achilles tightness.  Lacking 5-10 degrees for plantigrade. With seated soleus stretch (left foot met heads positioned edge of 2" step) lacking 1.5 inches for heel contact  DTRs:    POSTURE: weight shifts right  LOWER EXTREMITY ROM:     Active  Right Eval Left Eval  Hip flexion    Hip extension    Hip abduction    Hip adduction    Hip internal rotation    Hip external rotation    Knee flexion    Knee extension    Ankle dorsiflexion 15 -10  Ankle plantarflexion    Ankle inversion    Ankle eversion     (Blank rows = not tested)  LOWER EXTREMITY MMT:    MMT Right Eval Left Eval  Hip flexion 5 3-  Hip extension    Hip abduction (in sitting) 5 4-  Hip adduction (in sitting) 5 4  Hip internal rotation    Hip external rotation    Knee flexion 5 4-  Knee extension 5 4  Ankle dorsiflexion 5 0  Ankle plantarflexion 4 0  Ankle inversion 5 0  Ankle eversion 5 0  (Blank rows = not tested)  BED MOBILITY:  NT  TRANSFERS: Assistive device utilized: Single point cane  Sit to stand: Complete Independence Stand to sit: Complete Independence and Modified independence Chair to chair: Complete Independence and Modified independence Floor:  NT  RAMP:  NT  CURB:  Level of Assistance: SBA and CGA Assistive device utilized: Single point cane Curb Comments: must lead up with RLE and descend w/ LLE  STAIRS:  Level of Assistance: Modified independence  Stair Negotiation Technique: Step to Pattern with Single Rail on Right or cane  Number of Stairs: 4   Height of  Stairs: 6  Comments: turns slightly sideways  GAIT: Gait pattern: decreased stride length, circumduction- Left, Left foot flat, abducted- Left, and poor foot clearance- Left Distance walked: 80 ft Assistive device utilized: Single point cane Level of assistance: Modified independence Comments: level surfaces  FUNCTIONAL TESTs:  5 times sit to stand: 13.90 sec Timed up and go (TUG): 26 sec w/ HUrrycane 10 meter walk test: 1.3 ft/sec Berg Balance Scale: 45/56  PATIENT SURVEYS:  NT    PATIENT EDUCATION: Education details: see above Person educated: Patient and Parent Education method: Customer service manager Education comprehension: verbalized understanding     GOALS: Goals reviewed with patient? Yes  SHORT TERM GOALS: Target date: 06/12/2022  Patient will be independent in HEP to improve functional outcomes Baseline: initiated land-based activities Goal status: on-going; to now include aquatic interventions/HEP  2.  Pt to be able to don/doff left AFO to improve functional independence and safety with transfers and gait Baseline: N/A. Still awaiting arrival of device from [employer] Goal status: on-going    LONG TERM GOALS: Target date: 07/10/2022  Demonstrate improved safety and speed of ambulation per time 18 sec TUG test to reduce risk for falls Baseline: 26 sec; (07/02/22) 26 sec Goal status: On-going  2.  Demonstrate improved efficiency of gait by ambulating with speed of 2.0 ft/sec for increased independence in community Baseline: 1.3 ft/sec; (07/02/22) 1.38 ft/sec w/ cane Goal status: On-going   ASSESSMENT:  CLINICAL IMPRESSION: Pt had to leave early today from session. Plan to laminate and issue HEP next session which as scheduled is last session. In event he is to be DC as per land based therapist after re-assess testing, program to be left with our secretary which he can pick up at any  time (he and cg aware). He is safe and indep in setting and is indep  with aquatic HEP.  He does have access to a pool here at [facility] as he is a member.  He demonstrates good balance negotiating 3-4 steps from 50% submerged to 25% submerged using step to pattern unsupported ascending and descending. Had pt increase speed of walking/running which he completes without LOB in 37f.  Objective testing to complete next session.     Re-assessment impression: Treatment session today with emphasis on re-assessment and note that no change with gait speed or TUG test time (fall risk) since start of care.  Use of left AFO obviously improves gait speed and quality of gait cycle as well as once left AFO is applied pt exhibits decrease in circumduction and able to increase gait speed as evident by 10 meter walk test and TUG test times. This therapist has facilitated acquisition of AFO and relevant intervention and is awaiting device to be delivered/furnished by local [employer] vendor.  Patient would benefit from continued sessions to advance POC details and further develop and refine HEP which will include aquatic-based interventions as pt has been able to obtain membership to facility for continued, on-going aquatic activities for HEP development.      OBJECTIVE IMPAIRMENTS Abnormal gait, decreased activity tolerance, decreased balance, decreased coordination, decreased knowledge of condition, decreased knowledge of use of DME, difficulty walking, decreased ROM, decreased strength, impaired flexibility, and impaired tone.   ACTIVITY LIMITATIONS carrying, standing, stairs, transfers, and locomotion level  PARTICIPATION LIMITATIONS: cleaning, shopping, community activity, and yard work  PERSONAL FACTORS Time since onset of injury/illness/exacerbation and 1-2 comorbidities: DM, HTN, CKD  are also affecting patient's functional outcome.   REHAB POTENTIAL: Good  CLINICAL DECISION MAKING: Stable/uncomplicated  EVALUATION COMPLEXITY: Low  PLAN: PT FREQUENCY: 1x/week  PT DURATION: 8  weeks  PLANNED INTERVENTIONS: Therapeutic exercises, Therapeutic activity, Neuromuscular re-education, Balance training, Gait training, Patient/Family education, Self Care, Joint mobilization, Stair training, Vestibular training, Canalith repositioning, Orthotic/Fit training, DME instructions, Aquatic Therapy, Dry Needling, Electrical stimulation, Wheelchair mobility training, Taping, and Manual therapy  PLAN FOR NEXT SESSION:re-assess/ DC    10:44 AM, 08/09/22 MAnnamarie Major [email or web address]

## 2022-08-16 ENCOUNTER — Ambulatory Visit (HOSPITAL_BASED_OUTPATIENT_CLINIC_OR_DEPARTMENT_OTHER): Payer: Medicaid Other | Admitting: Physical Therapy

## 2022-08-17 ENCOUNTER — Encounter (HOSPITAL_COMMUNITY): Payer: Medicaid Other

## 2022-08-21 NOTE — Therapy (Signed)
[facility] [address] [place], [place], [address] Phone: [phone]   Fax:  [phone]  Patient Details  Name: Tony Schmitt MRN: [id number] Date of Birth: 1970-10-15 Referring Provider:  No ref. provider found  Encounter Date: 08/21/2022 PHYSICAL THERAPY DISCHARGE SUMMARY  Visits from Start of Care: 12  Current functional level related to goals / functional outcomes: Pt engaged in sessions focused on training in orthotic use to improve gait stability and pattern, progressing with STG/LTG and access to aquatic sessions to develop comprehensive land and aquatic activity repertoire.    Remaining deficits: Continued risk for falls per TUG test time, reduced gait velocity   Education / Equipment: HEP (aquatic and land), trials of AFO, facilitation of AFO intervention via MD and orthotist   Patient agrees to discharge. Patient goals were partially met. Patient is being discharged due to not returning since the last visit.   8:57 AM, 08/21/22 M. Sherlyn Lees, PT, DPT Physical Therapist- [place] Office Number: [phone]   [place] [facility] [place] [address] [place], [place], [address] Phone: [phone]   Fax:  [phone]

## 2022-08-23 ENCOUNTER — Ambulatory Visit: Payer: Medicaid Other

## 2022-08-23 DIAGNOSIS — Z01818 Encounter for other preprocedural examination: Secondary | ICD-10-CM | POA: Diagnosis not present

## 2022-08-23 DIAGNOSIS — N186 End stage renal disease: Secondary | ICD-10-CM | POA: Diagnosis not present

## 2022-08-30 DIAGNOSIS — Z01810 Encounter for preprocedural cardiovascular examination: Secondary | ICD-10-CM | POA: Diagnosis not present

## 2022-08-30 DIAGNOSIS — Z01818 Encounter for other preprocedural examination: Secondary | ICD-10-CM | POA: Diagnosis not present

## 2022-08-31 ENCOUNTER — Ambulatory Visit (HOSPITAL_COMMUNITY)
Admission: RE | Admit: 2022-08-31 | Discharge: 2022-08-31 | Disposition: A | Discharge status: Home / Self Care | Payer: Medicaid Other | Source: Ambulatory Visit | Attending: Nephrology | Admitting: Nephrology

## 2022-08-31 VITALS — BP 143/73 | HR 76 | Temp 97.7°F | Resp 17

## 2022-08-31 DIAGNOSIS — N183 Chronic kidney disease, stage 3 unspecified: Secondary | ICD-10-CM | POA: Diagnosis present

## 2022-08-31 LAB — RENAL FUNCTION PANEL
Albumin: 3.3 g/dL — ABNORMAL LOW (ref 3.5–5.0)
Anion gap: 8 (ref 5–15)
BUN: 53 mg/dL — ABNORMAL HIGH (ref 6–20)
CO2: 24 mmol/L (ref 22–32)
Calcium: 9.4 mg/dL (ref 8.9–10.3)
Chloride: 105 mmol/L (ref 98–111)
Creatinine, Ser: 4.21 mg/dL — ABNORMAL HIGH (ref 0.61–1.24)
GFR, Estimated: 16 mL/min — ABNORMAL LOW (ref >60)
Glucose, Bld: 171 mg/dL — ABNORMAL HIGH (ref 70–99)
Phosphorus: 3 mg/dL (ref 2.5–4.6)
Potassium: 3.6 mmol/L (ref 3.5–5.1)
Sodium: 137 mmol/L (ref 135–145)

## 2022-08-31 LAB — CBC WITH DIFFERENTIAL/PLATELET
Abs Immature Granulocytes: 0.04 10*3/uL (ref 0.00–0.07)
Basophils Absolute: 0.1 10*3/uL (ref 0.0–0.1)
Basophils Relative: 1 %
Eosinophils Absolute: 0.2 10*3/uL (ref 0.0–0.5)
Eosinophils Relative: 2 %
HCT: 30.4 % — ABNORMAL LOW (ref 39.0–52.0)
Hemoglobin: 10.1 g/dL — ABNORMAL LOW (ref 13.0–17.0)
Immature Granulocytes: 0 %
Lymphocytes Relative: 23 %
Lymphs Abs: 2.3 10*3/uL (ref 0.7–4.0)
MCH: 27.3 pg (ref 26.0–34.0)
MCHC: 33.2 g/dL (ref 30.0–36.0)
MCV: 82.2 fL (ref 80.0–100.0)
Monocytes Absolute: 0.5 10*3/uL (ref 0.1–1.0)
Monocytes Relative: 5 %
Neutro Abs: 6.8 10*3/uL (ref 1.7–7.7)
Neutrophils Relative %: 69 %
Platelets: 324 10*3/uL (ref 150–400)
RBC: 3.7 MIL/uL — ABNORMAL LOW (ref 4.22–5.81)
RDW: 15.6 % — ABNORMAL HIGH (ref 11.5–15.5)
WBC: 9.9 10*3/uL (ref 4.0–10.5)
nRBC: 0 % (ref 0.0–0.2)

## 2022-08-31 LAB — IRON AND TIBC
Iron: 78 ug/dL (ref 45–182)
Saturation Ratios: 35 % (ref 17.9–39.5)
TIBC: 221 ug/dL — ABNORMAL LOW (ref 250–450)
UIBC: 143 ug/dL

## 2022-08-31 LAB — POCT HEMOGLOBIN-HEMACUE: Hemoglobin: 10.2 g/dL — ABNORMAL LOW (ref 13.0–17.0)

## 2022-08-31 LAB — FERRITIN: Ferritin: 289 ng/mL (ref 24–336)

## 2022-08-31 MED ORDER — EPOETIN ALFA-EPBX 10000 UNIT/ML IJ SOLN
INTRAMUSCULAR | Status: AC
  Administered 2022-08-31: 20000 [IU] via SUBCUTANEOUS
  Filled 2022-08-31: qty 2

## 2022-08-31 MED ORDER — EPOETIN ALFA-EPBX 10000 UNIT/ML IJ SOLN: 20000.0000 [IU] | INTRAMUSCULAR | Status: DC

## 2022-09-03 DIAGNOSIS — I120 Hypertensive chronic kidney disease with stage 5 chronic kidney disease or end stage renal disease: Secondary | ICD-10-CM | POA: Diagnosis not present

## 2022-09-03 DIAGNOSIS — D631 Anemia in chronic kidney disease: Secondary | ICD-10-CM | POA: Diagnosis not present

## 2022-09-03 DIAGNOSIS — N2581 Secondary hyperparathyroidism of renal origin: Secondary | ICD-10-CM | POA: Diagnosis not present

## 2022-09-03 DIAGNOSIS — I639 Cerebral infarction, unspecified: Secondary | ICD-10-CM | POA: Diagnosis not present

## 2022-09-03 DIAGNOSIS — N185 Chronic kidney disease, stage 5: Secondary | ICD-10-CM | POA: Diagnosis not present

## 2022-09-03 DIAGNOSIS — E1122 Type 2 diabetes mellitus with diabetic chronic kidney disease: Secondary | ICD-10-CM | POA: Diagnosis not present

## 2022-09-04 ENCOUNTER — Encounter: Payer: Medicaid Other | Admitting: Physical Medicine & Rehabilitation

## 2022-09-12 ENCOUNTER — Other Ambulatory Visit: Payer: Self-pay | Admitting: Family Medicine

## 2022-09-12 DIAGNOSIS — E1122 Type 2 diabetes mellitus with diabetic chronic kidney disease: Secondary | ICD-10-CM

## 2022-09-14 ENCOUNTER — Encounter: Payer: Medicaid Other | Admitting: Physical Medicine & Rehabilitation

## 2022-09-28 ENCOUNTER — Ambulatory Visit (HOSPITAL_COMMUNITY)
Admission: RE | Admit: 2022-09-28 | Discharge: 2022-09-28 | Disposition: A | Discharge status: Home / Self Care | Payer: Medicaid Other | Source: Ambulatory Visit | Attending: Nephrology | Admitting: Nephrology

## 2022-09-28 VITALS — BP 148/73 | HR 75 | Temp 97.3°F | Resp 17

## 2022-09-28 DIAGNOSIS — N183 Chronic kidney disease, stage 3 unspecified: Secondary | ICD-10-CM | POA: Insufficient documentation

## 2022-09-28 LAB — CBC WITH DIFFERENTIAL/PLATELET
Abs Immature Granulocytes: 0.05 10*3/uL (ref 0.00–0.07)
Basophils Absolute: 0.1 10*3/uL (ref 0.0–0.1)
Basophils Relative: 1 %
Eosinophils Absolute: 0.2 10*3/uL (ref 0.0–0.5)
Eosinophils Relative: 2 %
HCT: 31.6 % — ABNORMAL LOW (ref 39.0–52.0)
Hemoglobin: 10.3 g/dL — ABNORMAL LOW (ref 13.0–17.0)
Immature Granulocytes: 1 %
Lymphocytes Relative: 21 %
Lymphs Abs: 2.1 10*3/uL (ref 0.7–4.0)
MCH: 27.2 pg (ref 26.0–34.0)
MCHC: 32.6 g/dL (ref 30.0–36.0)
MCV: 83.6 fL (ref 80.0–100.0)
Monocytes Absolute: 0.6 10*3/uL (ref 0.1–1.0)
Monocytes Relative: 6 %
Neutro Abs: 7.1 10*3/uL (ref 1.7–7.7)
Neutrophils Relative %: 69 %
Platelets: 339 10*3/uL (ref 150–400)
RBC: 3.78 MIL/uL — ABNORMAL LOW (ref 4.22–5.81)
RDW: 15.9 % — ABNORMAL HIGH (ref 11.5–15.5)
WBC: 10.2 10*3/uL (ref 4.0–10.5)
nRBC: 0 % (ref 0.0–0.2)

## 2022-09-28 LAB — RENAL FUNCTION PANEL
Albumin: 3.2 g/dL — ABNORMAL LOW (ref 3.5–5.0)
Anion gap: 10 (ref 5–15)
BUN: 49 mg/dL — ABNORMAL HIGH (ref 6–20)
CO2: 22 mmol/L (ref 22–32)
Calcium: 9.5 mg/dL (ref 8.9–10.3)
Chloride: 105 mmol/L (ref 98–111)
Creatinine, Ser: 4.21 mg/dL — ABNORMAL HIGH (ref 0.61–1.24)
GFR, Estimated: 16 mL/min — ABNORMAL LOW (ref >60)
Glucose, Bld: 155 mg/dL — ABNORMAL HIGH (ref 70–99)
Phosphorus: 2.9 mg/dL (ref 2.5–4.6)
Potassium: 4 mmol/L (ref 3.5–5.1)
Sodium: 137 mmol/L (ref 135–145)

## 2022-09-28 LAB — POCT HEMOGLOBIN-HEMACUE: Hemoglobin: 10.2 g/dL — ABNORMAL LOW (ref 13.0–17.0)

## 2022-09-28 MED ORDER — EPOETIN ALFA-EPBX 40000 UNIT/ML IJ SOLN
INTRAMUSCULAR | Status: AC
  Administered 2022-09-28: 40000 [IU] via SUBCUTANEOUS
  Filled 2022-09-28: qty 1

## 2022-09-28 MED ORDER — EPOETIN ALFA-EPBX 10000 UNIT/ML IJ SOLN: 20000.0000 [IU] | INTRAMUSCULAR | Status: DC

## 2022-10-03 ENCOUNTER — Encounter: Payer: Self-pay | Admitting: Podiatry

## 2022-10-03 ENCOUNTER — Ambulatory Visit (INDEPENDENT_AMBULATORY_CARE_PROVIDER_SITE_OTHER): Payer: Medicaid Other | Admitting: Podiatry

## 2022-10-03 DIAGNOSIS — E1165 Type 2 diabetes mellitus with hyperglycemia: Secondary | ICD-10-CM | POA: Diagnosis not present

## 2022-10-03 DIAGNOSIS — B351 Tinea unguium: Secondary | ICD-10-CM

## 2022-10-03 NOTE — Progress Notes (Signed)
This patient returns to my office for at risk foot care.  This patient requires this care by a professional since this patient will be at risk due to having CKD and diabetes and CVA..  This patient is unable to cut nails himself since the patient cannot reach his nails.These nails are painful walking and wearing shoes.  This patient presents for at risk foot care today.  General Appearance  Alert, conversant and in no acute stress.  Vascular  Dorsalis pedis and posterior tibial  pulses are palpable  bilaterally.  Capillary return is within normal limits  bilaterally. Temperature is within normal limits  bilaterally.  Neurologic  Senn-Weinstein monofilament wire test within normal limits  bilaterally. Muscle power within normal limits bilaterally.  Nails Thick disfigured discolored nails with subungual debris  from hallux to fifth toes bilaterally. No evidence of bacterial infection or drainage bilaterally.  Orthopedic  No limitations of motion  feet .  No crepitus or effusions noted.  No bony pathology or digital deformities noted.  Skin  dry scaly skin with no porokeratosis noted bilaterally.  No signs of infections or ulcers noted.     Onychomycosis  Pain in right toes  Pain in left toes  Consent was obtained for treatment procedures.   Mechanical debridement of nails 1-5  bilaterally performed with a nail nipper.  Filed with dremel without incident.    Return office visit   3 months                   Told patient to return for periodic foot care and evaluation due to potential at risk complications.   Gardiner Barefoot DPM

## 2022-10-05 DIAGNOSIS — I120 Hypertensive chronic kidney disease with stage 5 chronic kidney disease or end stage renal disease: Secondary | ICD-10-CM | POA: Diagnosis not present

## 2022-10-05 DIAGNOSIS — R0602 Shortness of breath: Secondary | ICD-10-CM | POA: Diagnosis not present

## 2022-10-05 DIAGNOSIS — Z8673 Personal history of transient ischemic attack (TIA), and cerebral infarction without residual deficits: Secondary | ICD-10-CM | POA: Diagnosis not present

## 2022-10-05 DIAGNOSIS — I088 Other rheumatic multiple valve diseases: Secondary | ICD-10-CM | POA: Diagnosis not present

## 2022-10-05 DIAGNOSIS — E785 Hyperlipidemia, unspecified: Secondary | ICD-10-CM | POA: Diagnosis not present

## 2022-10-05 DIAGNOSIS — E1122 Type 2 diabetes mellitus with diabetic chronic kidney disease: Secondary | ICD-10-CM | POA: Diagnosis not present

## 2022-10-05 DIAGNOSIS — Z87891 Personal history of nicotine dependence: Secondary | ICD-10-CM | POA: Diagnosis not present

## 2022-10-05 DIAGNOSIS — Z79899 Other long term (current) drug therapy: Secondary | ICD-10-CM | POA: Diagnosis not present

## 2022-10-05 DIAGNOSIS — N186 End stage renal disease: Secondary | ICD-10-CM | POA: Diagnosis not present

## 2022-10-05 DIAGNOSIS — Z01818 Encounter for other preprocedural examination: Secondary | ICD-10-CM | POA: Diagnosis not present

## 2022-10-05 DIAGNOSIS — Z7984 Long term (current) use of oral hypoglycemic drugs: Secondary | ICD-10-CM | POA: Diagnosis not present

## 2022-10-19 ENCOUNTER — Encounter (HOSPITAL_COMMUNITY): Payer: Self-pay

## 2022-10-26 ENCOUNTER — Ambulatory Visit (HOSPITAL_COMMUNITY)
Admission: RE | Admit: 2022-10-26 | Discharge: 2022-10-26 | Disposition: A | Discharge status: Home / Self Care | Payer: 59 | Source: Ambulatory Visit | Attending: Nephrology | Admitting: Nephrology

## 2022-10-26 VITALS — BP 142/69 | HR 77 | Temp 97.4°F | Resp 18

## 2022-10-26 DIAGNOSIS — N183 Chronic kidney disease, stage 3 unspecified: Secondary | ICD-10-CM | POA: Diagnosis not present

## 2022-10-26 LAB — CBC WITH DIFFERENTIAL/PLATELET
Abs Immature Granulocytes: 0.04 10*3/uL (ref 0.00–0.07)
Basophils Absolute: 0.1 10*3/uL (ref 0.0–0.1)
Basophils Relative: 1 %
Eosinophils Absolute: 0.2 10*3/uL (ref 0.0–0.5)
Eosinophils Relative: 2 %
HCT: 30.9 % — ABNORMAL LOW (ref 39.0–52.0)
Hemoglobin: 10 g/dL — ABNORMAL LOW (ref 13.0–17.0)
Immature Granulocytes: 0 %
Lymphocytes Relative: 23 %
Lymphs Abs: 2.1 10*3/uL (ref 0.7–4.0)
MCH: 27 pg (ref 26.0–34.0)
MCHC: 32.4 g/dL (ref 30.0–36.0)
MCV: 83.5 fL (ref 80.0–100.0)
Monocytes Absolute: 0.6 10*3/uL (ref 0.1–1.0)
Monocytes Relative: 6 %
Neutro Abs: 6.2 10*3/uL (ref 1.7–7.7)
Neutrophils Relative %: 68 %
Platelets: 328 10*3/uL (ref 150–400)
RBC: 3.7 MIL/uL — ABNORMAL LOW (ref 4.22–5.81)
RDW: 15.7 % — ABNORMAL HIGH (ref 11.5–15.5)
WBC: 9.3 10*3/uL (ref 4.0–10.5)
nRBC: 0 % (ref 0.0–0.2)

## 2022-10-26 LAB — IRON AND TIBC
Iron: 63 ug/dL (ref 45–182)
Saturation Ratios: 26 % (ref 17.9–39.5)
TIBC: 242 ug/dL — ABNORMAL LOW (ref 250–450)
UIBC: 179 ug/dL

## 2022-10-26 LAB — RENAL FUNCTION PANEL
Albumin: 3.4 g/dL — ABNORMAL LOW (ref 3.5–5.0)
Anion gap: 9 (ref 5–15)
BUN: 49 mg/dL — ABNORMAL HIGH (ref 6–20)
CO2: 22 mmol/L (ref 22–32)
Calcium: 9.6 mg/dL (ref 8.9–10.3)
Chloride: 111 mmol/L (ref 98–111)
Creatinine, Ser: 4.02 mg/dL — ABNORMAL HIGH (ref 0.61–1.24)
GFR, Estimated: 17 mL/min — ABNORMAL LOW (ref >60)
Glucose, Bld: 87 mg/dL (ref 70–99)
Phosphorus: 3.3 mg/dL (ref 2.5–4.6)
Potassium: 3.5 mmol/L (ref 3.5–5.1)
Sodium: 142 mmol/L (ref 135–145)

## 2022-10-26 LAB — POCT HEMOGLOBIN-HEMACUE: Hemoglobin: 10 g/dL — ABNORMAL LOW (ref 13.0–17.0)

## 2022-10-26 LAB — FERRITIN: Ferritin: 368 ng/mL — ABNORMAL HIGH (ref 24–336)

## 2022-10-26 MED ORDER — EPOETIN ALFA-EPBX 40000 UNIT/ML IJ SOLN
INTRAMUSCULAR | Status: AC
  Administered 2022-10-26: 20000 [IU]
  Filled 2022-10-26: qty 1

## 2022-10-26 MED ORDER — EPOETIN ALFA-EPBX 10000 UNIT/ML IJ SOLN: 20000.0000 [IU] | INTRAMUSCULAR | Status: DC

## 2022-10-27 ENCOUNTER — Other Ambulatory Visit: Payer: Self-pay | Admitting: Family Medicine

## 2022-10-27 DIAGNOSIS — E1122 Type 2 diabetes mellitus with diabetic chronic kidney disease: Secondary | ICD-10-CM

## 2022-10-29 DIAGNOSIS — N185 Chronic kidney disease, stage 5: Secondary | ICD-10-CM | POA: Diagnosis not present

## 2022-10-29 DIAGNOSIS — N2581 Secondary hyperparathyroidism of renal origin: Secondary | ICD-10-CM | POA: Diagnosis not present

## 2022-10-29 DIAGNOSIS — D631 Anemia in chronic kidney disease: Secondary | ICD-10-CM | POA: Diagnosis not present

## 2022-10-29 DIAGNOSIS — I120 Hypertensive chronic kidney disease with stage 5 chronic kidney disease or end stage renal disease: Secondary | ICD-10-CM | POA: Diagnosis not present

## 2022-10-29 DIAGNOSIS — E1122 Type 2 diabetes mellitus with diabetic chronic kidney disease: Secondary | ICD-10-CM | POA: Diagnosis not present

## 2022-10-29 DIAGNOSIS — I639 Cerebral infarction, unspecified: Secondary | ICD-10-CM | POA: Diagnosis not present

## 2022-10-29 NOTE — Telephone Encounter (Signed)
Requested Prescriptions  Pending Prescriptions Disp Refills   furosemide (LASIX) 80 MG tablet [Pharmacy Med Name: Furosemide 80 MG Oral Tablet] 90 tablet 0    Sig: Take 1 tablet by mouth once daily     Cardiovascular:  Diuretics - Loop Failed - 10/29/2022 10:46 AM      Failed - Cr in normal range and within 180 days    Creatinine, Ser  Date Value Ref Range Status  10/26/2022 4.02 (H) 0.61 - 1.24 mg/dL Final   Creatinine, Urine  Date Value Ref Range Status  06/11/2020 87.72 mg/dL Final    Comment:    Performed at [facility], [place] [address] [place], [place] [address]         Failed - Mg Level in normal range and within 180 days    Magnesium  Date Value Ref Range Status  06/11/2020 1.5 (L) 1.7 - 2.4 mg/dL Final    Comment:    Performed at [facility], [place] [address] [place], [place] [address]         Failed - Last BP in normal range    BP Readings from Last 1 Encounters:  10/26/22 (!) 142/69         Passed - K in normal range and within 180 days    Potassium  Date Value Ref Range Status  10/26/2022 3.5 3.5 - 5.1 mmol/L Final         Passed - Ca in normal range and within 180 days    Calcium  Date Value Ref Range Status  10/26/2022 9.6 8.9 - 10.3 mg/dL Final   Calcium, Ion  Date Value Ref Range Status  04/16/2020 1.30 1.15 - 1.40 mmol/L Final         Passed - Na in normal range and within 180 days    Sodium  Date Value Ref Range Status  10/26/2022 142 135 - 145 mmol/L Final  02/07/2022 140 134 - 144 mmol/L Final         Passed - Cl in normal range and within 180 days    Chloride  Date Value Ref Range Status  10/26/2022 111 98 - 111 mmol/L Final         Passed - Valid encounter within last 6 months    Recent Outpatient Visits           2 months ago Type 2 diabetes mellitus with other specified complication, without long-term current use of insulin ([place])   [place], [place], MD   9 months ago  Type 2 diabetes mellitus with other specified complication, without long-term current use of insulin ([place])   [place], [place], MD   9 months ago Hypertension in stage 4 chronic kidney disease due to type 2 diabetes mellitus [facility])   [place] Ausdall, [place] L, RPH-CPP   10 months ago Hypertension in stage 4 chronic kidney disease due to type 2 diabetes mellitus [facility])   [place] [employer] Ausdall, [place] L, RPH-CPP   11 months ago Hypertension in stage 4 chronic kidney disease due to type 2 diabetes mellitus [facility])   [place] Ausdall, Jarome Matin, RPH-CPP       Future Appointments             In 2 months Charlott Rakes, MD [place]  [place]

## 2022-10-29 NOTE — Telephone Encounter (Signed)
Pt mom is calling to check on the status of his medication refill. Pharmacy advised her to call PCP office for refill.   furosemide (LASIX) 80 MG tablet       [place] ([place]), [place]  Phone: [phone] Fax: [phone]

## 2022-11-04 ENCOUNTER — Other Ambulatory Visit: Payer: Self-pay | Admitting: Family Medicine

## 2022-11-08 ENCOUNTER — Other Ambulatory Visit: Payer: Self-pay | Admitting: Family Medicine

## 2022-11-08 NOTE — Telephone Encounter (Signed)
Refused atorvastatin because he was started on rosuvastatin on 02/09/2022 #90, 1 refill by Dr. Margarita Rana.

## 2022-11-19 ENCOUNTER — Encounter: Payer: Self-pay | Admitting: Gastroenterology

## 2022-11-19 ENCOUNTER — Telehealth: Payer: Self-pay | Admitting: Family Medicine

## 2022-11-19 DIAGNOSIS — Z1211 Encounter for screening for malignant neoplasm of colon: Secondary | ICD-10-CM

## 2022-11-19 NOTE — Telephone Encounter (Signed)
Routing to PCP for review.

## 2022-11-19 NOTE — Addendum Note (Signed)
Addended by: Hoy Register on: 11/19/2022 12:54 PM   Modules accepted: Orders

## 2022-11-19 NOTE — Telephone Encounter (Signed)
Copied from CRM [id number]. Topic: General - Inquiry >> Nov 19, 2022 11:42 AM [place] wrote: Reason for CRM:Pt stated he was advised by [facility]. That he needs a referral for a colonoscopy as he is a kidney patient.  Please advise.

## 2022-11-19 NOTE — Telephone Encounter (Signed)
error 

## 2022-11-19 NOTE — Telephone Encounter (Signed)
Referral has been placed. 

## 2022-11-23 ENCOUNTER — Ambulatory Visit (HOSPITAL_COMMUNITY)
Admission: RE | Admit: 2022-11-23 | Discharge: 2022-11-23 | Disposition: A | Discharge status: Home / Self Care | Payer: 59 | Source: Ambulatory Visit | Attending: Nephrology | Admitting: Nephrology

## 2022-11-23 VITALS — BP 123/59 | HR 71 | Temp 97.4°F | Resp 18

## 2022-11-23 DIAGNOSIS — N183 Chronic kidney disease, stage 3 unspecified: Secondary | ICD-10-CM | POA: Diagnosis not present

## 2022-11-23 LAB — RENAL FUNCTION PANEL
Albumin: 3.3 g/dL — ABNORMAL LOW (ref 3.5–5.0)
Anion gap: 9 (ref 5–15)
BUN: 55 mg/dL — ABNORMAL HIGH (ref 6–20)
CO2: 21 mmol/L — ABNORMAL LOW (ref 22–32)
Calcium: 9.2 mg/dL (ref 8.9–10.3)
Chloride: 107 mmol/L (ref 98–111)
Creatinine, Ser: 4.79 mg/dL — ABNORMAL HIGH (ref 0.61–1.24)
GFR, Estimated: 14 mL/min — ABNORMAL LOW (ref >60)
Glucose, Bld: 178 mg/dL — ABNORMAL HIGH (ref 70–99)
Phosphorus: 4 mg/dL (ref 2.5–4.6)
Potassium: 3.7 mmol/L (ref 3.5–5.1)
Sodium: 137 mmol/L (ref 135–145)

## 2022-11-23 LAB — CBC WITH DIFFERENTIAL/PLATELET
Abs Immature Granulocytes: 0.04 10*3/uL (ref 0.00–0.07)
Basophils Absolute: 0.1 10*3/uL (ref 0.0–0.1)
Basophils Relative: 1 %
Eosinophils Absolute: 0.2 10*3/uL (ref 0.0–0.5)
Eosinophils Relative: 2 %
HCT: 33.3 % — ABNORMAL LOW (ref 39.0–52.0)
Hemoglobin: 10.6 g/dL — ABNORMAL LOW (ref 13.0–17.0)
Immature Granulocytes: 1 %
Lymphocytes Relative: 25 %
Lymphs Abs: 2.1 10*3/uL (ref 0.7–4.0)
MCH: 26.9 pg (ref 26.0–34.0)
MCHC: 31.8 g/dL (ref 30.0–36.0)
MCV: 84.5 fL (ref 80.0–100.0)
Monocytes Absolute: 0.6 10*3/uL (ref 0.1–1.0)
Monocytes Relative: 7 %
Neutro Abs: 5.5 10*3/uL (ref 1.7–7.7)
Neutrophils Relative %: 64 %
Platelets: 348 10*3/uL (ref 150–400)
RBC: 3.94 MIL/uL — ABNORMAL LOW (ref 4.22–5.81)
RDW: 15.6 % — ABNORMAL HIGH (ref 11.5–15.5)
WBC: 8.5 10*3/uL (ref 4.0–10.5)
nRBC: 0 % (ref 0.0–0.2)

## 2022-11-23 LAB — POCT HEMOGLOBIN-HEMACUE: Hemoglobin: 10.9 g/dL — ABNORMAL LOW (ref 13.0–17.0)

## 2022-11-23 MED ORDER — EPOETIN ALFA-EPBX 10000 UNIT/ML IJ SOLN: 20000.0000 [IU] | INTRAMUSCULAR | Status: DC

## 2022-11-23 MED ORDER — EPOETIN ALFA-EPBX 10000 UNIT/ML IJ SOLN
INTRAMUSCULAR | Status: AC
  Administered 2022-11-23: 20000 [IU] via SUBCUTANEOUS
  Filled 2022-11-23: qty 2

## 2022-12-11 ENCOUNTER — Ambulatory Visit (AMBULATORY_SURGERY_CENTER): Payer: 59 | Admitting: *Deleted

## 2022-12-11 ENCOUNTER — Encounter: Payer: Self-pay | Admitting: Gastroenterology

## 2022-12-11 VITALS — Ht 64.0 in | Wt 165.0 lb

## 2022-12-11 DIAGNOSIS — Z1211 Encounter for screening for malignant neoplasm of colon: Secondary | ICD-10-CM

## 2022-12-11 MED ORDER — PEG 3350-KCL-NA BICARB-NACL 420 G PO SOLR: ORAL | 0 refills | Status: DC

## 2022-12-11 NOTE — Progress Notes (Signed)
No egg or soy allergy known to patient  No issues known to pt with past sedation with any surgeries or procedures Patient denies ever being told they had issues or difficulty with intubation  No FH of Malignant Hyperthermia Pt is not on diet pills Pt is not on  home 02  Pt is not on blood thinners  Pt denies issues with constipation  No A fib or A flutter Have any cardiac testing pending--NO Pt instructed to use Singlecare.com or GoodRx for a price reduction on prep    ABLE TO MOVE INDEPENDENTLY,DO HAVE LEFT SIDED WEAKNESS AMBULATORY WITH CANE ABLE TO UNDRESS SELF AND DRESS SELF AND GET ON STRETCHER INDEPENDENTLY,DENIES BEING ON DIALYSIS  Patient's chart reviewed by Cathlyn Parsons CNRA prior to previsit and patient appropriate for the [facility].  Previsit completed and red dot placed by patient's name on their procedure day (on provider's schedule).

## 2022-12-21 ENCOUNTER — Encounter (HOSPITAL_COMMUNITY): Payer: Self-pay

## 2022-12-21 ENCOUNTER — Ambulatory Visit (HOSPITAL_COMMUNITY)
Admission: RE | Admit: 2022-12-21 | Discharge: 2022-12-21 | Disposition: A | Discharge status: Home / Self Care | Payer: 59 | Source: Ambulatory Visit | Attending: Nephrology | Admitting: Nephrology

## 2022-12-21 VITALS — BP 137/72 | HR 74 | Temp 97.6°F | Resp 17

## 2022-12-21 DIAGNOSIS — N183 Chronic kidney disease, stage 3 unspecified: Secondary | ICD-10-CM | POA: Insufficient documentation

## 2022-12-21 DIAGNOSIS — J329 Chronic sinusitis, unspecified: Secondary | ICD-10-CM | POA: Diagnosis not present

## 2022-12-21 DIAGNOSIS — E1122 Type 2 diabetes mellitus with diabetic chronic kidney disease: Secondary | ICD-10-CM | POA: Diagnosis not present

## 2022-12-21 DIAGNOSIS — B9689 Other specified bacterial agents as the cause of diseases classified elsewhere: Secondary | ICD-10-CM | POA: Diagnosis not present

## 2022-12-21 DIAGNOSIS — N185 Chronic kidney disease, stage 5: Secondary | ICD-10-CM | POA: Diagnosis not present

## 2022-12-21 DIAGNOSIS — I639 Cerebral infarction, unspecified: Secondary | ICD-10-CM | POA: Diagnosis not present

## 2022-12-21 DIAGNOSIS — I120 Hypertensive chronic kidney disease with stage 5 chronic kidney disease or end stage renal disease: Secondary | ICD-10-CM | POA: Diagnosis not present

## 2022-12-21 DIAGNOSIS — N2581 Secondary hyperparathyroidism of renal origin: Secondary | ICD-10-CM | POA: Diagnosis not present

## 2022-12-21 DIAGNOSIS — D631 Anemia in chronic kidney disease: Secondary | ICD-10-CM | POA: Diagnosis not present

## 2022-12-21 DIAGNOSIS — I10 Essential (primary) hypertension: Secondary | ICD-10-CM | POA: Diagnosis not present

## 2022-12-21 LAB — IRON AND TIBC
Iron: 60 ug/dL (ref 45–182)
Saturation Ratios: 25 % (ref 17.9–39.5)
TIBC: 245 ug/dL — ABNORMAL LOW (ref 250–450)
UIBC: 185 ug/dL

## 2022-12-21 LAB — CBC WITH DIFFERENTIAL/PLATELET
Abs Immature Granulocytes: 0.03 10*3/uL (ref 0.00–0.07)
Basophils Absolute: 0.1 10*3/uL (ref 0.0–0.1)
Basophils Relative: 1 %
Eosinophils Absolute: 0.2 10*3/uL (ref 0.0–0.5)
Eosinophils Relative: 2 %
HCT: 32.5 % — ABNORMAL LOW (ref 39.0–52.0)
Hemoglobin: 10.3 g/dL — ABNORMAL LOW (ref 13.0–17.0)
Immature Granulocytes: 0 %
Lymphocytes Relative: 23 %
Lymphs Abs: 2 10*3/uL (ref 0.7–4.0)
MCH: 26.5 pg (ref 26.0–34.0)
MCHC: 31.7 g/dL (ref 30.0–36.0)
MCV: 83.5 fL (ref 80.0–100.0)
Monocytes Absolute: 0.6 10*3/uL (ref 0.1–1.0)
Monocytes Relative: 7 %
Neutro Abs: 5.8 10*3/uL (ref 1.7–7.7)
Neutrophils Relative %: 67 %
Platelets: 311 10*3/uL (ref 150–400)
RBC: 3.89 MIL/uL — ABNORMAL LOW (ref 4.22–5.81)
RDW: 15.9 % — ABNORMAL HIGH (ref 11.5–15.5)
WBC: 8.7 10*3/uL (ref 4.0–10.5)
nRBC: 0 % (ref 0.0–0.2)

## 2022-12-21 LAB — RENAL FUNCTION PANEL
Albumin: 3.6 g/dL (ref 3.5–5.0)
Anion gap: 13 (ref 5–15)
BUN: 58 mg/dL — ABNORMAL HIGH (ref 6–20)
CO2: 21 mmol/L — ABNORMAL LOW (ref 22–32)
Calcium: 9.6 mg/dL (ref 8.9–10.3)
Chloride: 104 mmol/L (ref 98–111)
Creatinine, Ser: 4.85 mg/dL — ABNORMAL HIGH (ref 0.61–1.24)
GFR, Estimated: 14 mL/min — ABNORMAL LOW (ref >60)
Glucose, Bld: 197 mg/dL — ABNORMAL HIGH (ref 70–99)
Phosphorus: 3.9 mg/dL (ref 2.5–4.6)
Potassium: 3.7 mmol/L (ref 3.5–5.1)
Sodium: 138 mmol/L (ref 135–145)

## 2022-12-21 LAB — FERRITIN: Ferritin: 412 ng/mL — ABNORMAL HIGH (ref 24–336)

## 2022-12-21 LAB — POCT HEMOGLOBIN-HEMACUE: Hemoglobin: 10.5 g/dL — ABNORMAL LOW (ref 13.0–17.0)

## 2022-12-21 MED ORDER — EPOETIN ALFA-EPBX 10000 UNIT/ML IJ SOLN
INTRAMUSCULAR | Status: AC
  Administered 2022-12-21: 20000 [IU] via SUBCUTANEOUS
  Filled 2022-12-21: qty 2

## 2022-12-21 MED ORDER — EPOETIN ALFA-EPBX 10000 UNIT/ML IJ SOLN: 20000.0000 [IU] | INTRAMUSCULAR | Status: DC

## 2022-12-27 ENCOUNTER — Encounter (HOSPITAL_COMMUNITY): Payer: Self-pay

## 2023-01-01 ENCOUNTER — Encounter: Payer: Self-pay | Admitting: Gastroenterology

## 2023-01-01 ENCOUNTER — Ambulatory Visit (AMBULATORY_SURGERY_CENTER): Payer: 59 | Admitting: Gastroenterology

## 2023-01-01 VITALS — BP 175/73 | HR 80 | Temp 97.8°F | Resp 13 | Ht 65.0 in | Wt 165.0 lb

## 2023-01-01 DIAGNOSIS — Z1211 Encounter for screening for malignant neoplasm of colon: Secondary | ICD-10-CM | POA: Diagnosis not present

## 2023-01-01 MED ORDER — SODIUM CHLORIDE 0.9 % IV SOLN
500.0000 mL | Freq: Once | INTRAVENOUS | Status: DC
Start: 2023-01-01 — End: 2023-01-01

## 2023-01-01 NOTE — Progress Notes (Signed)
Report to PACU, RN, vss, BBS= Clear.  

## 2023-01-01 NOTE — Progress Notes (Signed)
[facility] History and Physical   Primary Care Physician:  Hoy Register, MD   Reason for Procedure:   Colon cancer screening  Plan:    Screening colonoscopy     HPI: Tony Schmitt is a 52 y.o. male undergoing initial average risk screening colonoscopy.  He has no family history of colon cancer and no chronic GI symptoms.    Past Medical History:  Diagnosis Date   Anemia    Chronic kidney disease    Diabetes mellitus without complication (HCC)    GERD (gastroesophageal reflux disease)    Hypertension    Sleep apnea    Stroke [facility])     Past Surgical History:  Procedure Laterality Date   BUBBLE STUDY  04/18/2020   Procedure: BUBBLE STUDY;  Surgeon: Quintella Reichert, MD;  Location: [facility] ENDOSCOPY;  Service: Cardiovascular;;   TEE WITHOUT CARDIOVERSION N/A 04/18/2020   Procedure: TRANSESOPHAGEAL ECHOCARDIOGRAM (TEE);  Surgeon: Quintella Reichert, MD;  Location: [facility] ENDOSCOPY;  Service: Cardiovascular;  Laterality: N/A;    Prior to Admission medications   Medication Sig Start Date End Date Taking? Authorizing Provider  amLODipine (NORVASC) 10 MG tablet Take 1 tablet (10 mg total) by mouth daily. 08/01/22  Yes Hoy Register, MD  aspirin 81 MG chewable tablet Chew 1 tablet (81 mg total) by mouth daily. 04/20/20  Yes Regalado, Belkys A, MD  atorvastatin (LIPITOR) 80 MG tablet Take 1 tablet by mouth daily. 04/27/22  Yes [provider]  carvedilol (COREG) 25 MG tablet Take 1 tablet (25 mg total) by mouth 2 (two) times daily with a meal. 08/01/22  Yes Newlin, Enobong, MD  cloNIDine (CATAPRES) 0.1 MG tablet Take 0.1 mg by mouth at bedtime. 10/29/21  Yes [provider]  furosemide (LASIX) 80 MG tablet Take 1 tablet by mouth once daily 10/29/22  Yes Newlin, Enobong, MD  glipiZIDE (GLUCOTROL) 5 MG tablet Take 1 tablet (5 mg total) by mouth 2 (two) times daily before a meal. 08/01/22  Yes Newlin, Enobong, MD  hydrALAZINE (APRESOLINE) 100 MG tablet Take 1 tablet  (100 mg total) by mouth 3 (three) times daily. 08/01/22  Yes Hoy Register, MD  isosorbide mononitrate (IMDUR) 30 MG 24 hr tablet Take 1 tablet by mouth daily.   Yes [provider]  omeprazole (PRILOSEC) 40 MG capsule Take 1 capsule (40 mg total) by mouth daily. 08/01/22  Yes Hoy Register, MD  rosuvastatin (CRESTOR) 20 MG tablet Take 1 tablet (20 mg total) by mouth daily. 08/02/22  Yes Hoy Register, MD  sodium bicarbonate 650 MG tablet Take by mouth 2 (two) times daily. TAKE 2 TABLETS 2 X A DAY 06/11/22  Yes [provider]  acetaminophen (TYLENOL) 325 MG tablet Take 2 tablets (650 mg total) by mouth every 4 (four) hours as needed for mild pain (or temp > 37.5 C (99.5 F)). 05/11/20   Angiulli, Mcarthur Rossetti, PA-C  metoprolol tartrate (LOPRESSOR) 100 MG tablet Take 1 tablet (100 mg total) by mouth 2 (two) times daily. 05/11/20 05/26/20  Angiulli, Mcarthur Rossetti, PA-C    Current Outpatient Medications  Medication Sig Dispense Refill   amLODipine (NORVASC) 10 MG tablet Take 1 tablet (10 mg total) by mouth daily. 90 tablet 1   aspirin 81 MG chewable tablet Chew 1 tablet (81 mg total) by mouth daily. 30 tablet 0   atorvastatin (LIPITOR) 80 MG tablet Take 1 tablet by mouth daily.     carvedilol (COREG) 25 MG tablet Take 1 tablet (25 mg total)  by mouth 2 (two) times daily with a meal. 180 tablet 1   cloNIDine (CATAPRES) 0.1 MG tablet Take 0.1 mg by mouth at bedtime.     furosemide (LASIX) 80 MG tablet Take 1 tablet by mouth once daily 90 tablet 0   glipiZIDE (GLUCOTROL) 5 MG tablet Take 1 tablet (5 mg total) by mouth 2 (two) times daily before a meal. 180 tablet 1   hydrALAZINE (APRESOLINE) 100 MG tablet Take 1 tablet (100 mg total) by mouth 3 (three) times daily. 270 tablet 1   isosorbide mononitrate (IMDUR) 30 MG 24 hr tablet Take 1 tablet by mouth daily.     omeprazole (PRILOSEC) 40 MG capsule Take 1 capsule (40 mg total) by mouth daily. 90 capsule 1   rosuvastatin (CRESTOR) 20 MG  tablet Take 1 tablet (20 mg total) by mouth daily. 90 tablet 1   sodium bicarbonate 650 MG tablet Take by mouth 2 (two) times daily. TAKE 2 TABLETS 2 X A DAY     acetaminophen (TYLENOL) 325 MG tablet Take 2 tablets (650 mg total) by mouth every 4 (four) hours as needed for mild pain (or temp > 37.5 C (99.5 F)).     Current Facility-Administered Medications  Medication Dose Route Frequency Provider Last Rate Last Admin   0.9 %  sodium chloride infusion  500 mL Intravenous Once Jenel Lucks, MD        Allergies as of 01/01/2023 - Review Complete 01/01/2023  Allergen Reaction Noted   Latex Itching 06/10/2020    Family History  Problem Relation Age of Onset   Diabetes Mother    Diabetes Father    Hypertension Father    Cancer Father    Heart failure Other    Colon cancer Neg Hx    Colon polyps Neg Hx    Crohn's disease Neg Hx    Esophageal cancer Neg Hx    Rectal cancer Neg Hx    Stomach cancer Neg Hx    Ulcerative colitis Neg Hx     Social History   Socioeconomic History   Marital status: Single    Spouse name: Not on file   Number of children: Not on file   Years of education: Not on file   Highest education level: Not on file  Occupational History   Not on file  Tobacco Use   Smoking status: Former    Types: Cigarettes, Cigars   Smokeless tobacco: Never  Vaping Use   Vaping Use: Never used  Substance and Sexual Activity   Alcohol use: Not Currently   Drug use: Never   Sexual activity: Not on file  Other Topics Concern   Not on file  Social History Narrative   Not on file   Social Determinants of Health   Financial Resource Strain: Not on file  Food Insecurity: Not on file  Transportation Needs: Not on file  Physical Activity: Not on file  Stress: Not on file  Social Connections: Not on file  Intimate Partner Violence: Not on file    Review of Systems:  All other review of systems negative except as mentioned in the HPI.  Physical  Exam: Vital signs BP (!) 179/91   Pulse 82   Temp 97.8 F (36.6 C)   Ht 5\' 5"  (1.651 m)   Wt 165 lb (74.8 kg)   SpO2 100%   BMI 27.46 kg/m   General:   Alert,  Well-developed, well-nourished, pleasant and cooperative in NAD Airway:  Mallampati 1  Lungs:  Clear throughout to auscultation.   Heart:  Regular rate and rhythm; no murmurs, clicks, rubs,  or gallops. Abdomen:  Soft, nontender and nondistended. Normal bowel sounds.   Neuro/Psych:  Normal mood and affect. A and O x 3   Tony Schmitt E. Tomasa Rand, MD [facility]

## 2023-01-01 NOTE — Op Note (Signed)
[facility] Patient Name: Tony Schmitt Procedure Date: 01/01/2023 10:29 AM MRN: [id number] Endoscopist: Lorin Picket E. Tomasa Rand , MD, [id number] Age: 52 Referring MD:  Date of Birth: May 26, 1971 Gender: Male Account #: [id number] Procedure:                Colonoscopy Indications:              Screening for colorectal malignant neoplasm, This                            is the patient's first colonoscopy Medicines:                Monitored Anesthesia Care Procedure:                Pre-Anesthesia Assessment:                           - Prior to the procedure, a History and Physical                            was performed, and patient medications and                            allergies were reviewed. The patient's tolerance of                            previous anesthesia was also reviewed. The risks                            and benefits of the procedure and the sedation                            options and risks were discussed with the patient.                            All questions were answered, and informed consent                            was obtained. Prior Anticoagulants: The patient has                            taken no anticoagulant or antiplatelet agents                            except for aspirin. ASA Grade Assessment: III - A                            patient with severe systemic disease. After                            reviewing the risks and benefits, the patient was                            deemed in satisfactory condition to undergo the  procedure.                           After obtaining informed consent, the colonoscope                            was passed under direct vision. Throughout the                            procedure, the patient's blood pressure, pulse, and                            oxygen saturations were monitored continuously. The                            CF [id number] #[id number] was introduced through the  anus                            and advanced to the the terminal ileum, with                            identification of the appendiceal orifice and IC                            valve. The colonoscopy was performed without                            difficulty. The patient tolerated the procedure                            well. The quality of the bowel preparation was                            adequate. The terminal ileum, ileocecal valve,                            appendiceal orifice, and rectum were photographed.                            The bowel preparation used was GoLYTELY and Miralax                            via extended prep with split dose instruction. Scope In: 10:36:37 AM Scope Out: 10:50:25 AM Scope Withdrawal Time: 0 hours 7 minutes 11 seconds  Total Procedure Duration: 0 hours 13 minutes 48 seconds  Findings:                 The perianal and digital rectal examinations were                            normal. Pertinent negatives include normal                            sphincter tone and no palpable rectal lesions.  The colon (entire examined portion) appeared normal.                           The terminal ileum appeared normal.                           The retroflexed view of the distal rectum and anal                            verge was normal and showed no anal or rectal                            abnormalities. Complications:            No immediate complications. Estimated Blood Loss:     Estimated blood loss: none. Impression:               - The entire examined colon is normal.                           - The examined portion of the ileum was normal.                           - The distal rectum and anal verge are normal on                            retroflexion view.                           - No specimens collected. Recommendation:           - Patient has a contact number available for                            emergencies.  The signs and symptoms of potential                            delayed complications were discussed with the                            patient. Return to normal activities tomorrow.                            Written discharge instructions were provided to the                            patient.                           - Resume previous diet.                           - Continue present medications.                           - Repeat colonoscopy in 10 years for screening  purposes. Casimir Barcellos E. Tomasa Rand, MD 01/01/2023 10:54:33 AM This report has been signed electronically.

## 2023-01-01 NOTE — Patient Instructions (Signed)
YOU HAD AN ENDOSCOPIC PROCEDURE TODAY AT THE [facility]:   Refer to the procedure report that was given to you for any specific questions about what was found during the examination.  If the procedure report does not answer your questions, please call your gastroenterologist to clarify.  If you requested that your care partner not be given the details of your procedure findings, then the procedure report has been included in a sealed envelope for you to review at your convenience later.  YOU SHOULD EXPECT: Some feelings of bloating in the abdomen. Passage of more gas than usual.  Walking can help get rid of the air that was put into your GI tract during the procedure and reduce the bloating. If you had a lower endoscopy (such as a colonoscopy or flexible sigmoidoscopy) you may notice spotting of blood in your stool or on the toilet paper. If you underwent a bowel prep for your procedure, you may not have a normal bowel movement for a few days.  Please Note:  You might notice some irritation and congestion in your nose or some drainage.  This is from the oxygen used during your procedure.  There is no need for concern and it should clear up in a day or so.  SYMPTOMS TO REPORT IMMEDIATELY:  Following lower endoscopy (colonoscopy or flexible sigmoidoscopy):  Excessive amounts of blood in the stool  Significant tenderness or worsening of abdominal pains  Swelling of the abdomen that is new, acute  Fever of 100F or higher  For urgent or emergent issues, a gastroenterologist can be reached at any hour by calling [phone]. Do not use MyChart messaging for urgent concerns.    DIET:  We do recommend a small meal at first, but then you may proceed to your regular diet.  Drink plenty of fluids but you should avoid alcoholic beverages for 24 hours.  ACTIVITY:  You should plan to take it easy for the rest of today and you should NOT DRIVE or use heavy machinery until tomorrow (because of  the sedation medicines used during the test).    FOLLOW UP: Our staff will call the number listed on your records the next business day following your procedure.  We will call around 7:15- 8:00 am to check on you and address any questions or concerns that you may have regarding the information given to you following your procedure. If we do not reach you, we will leave a message.     If any biopsies were taken you will be contacted by phone or by letter within the next 1-3 weeks.  Please call us at [phone] if you have not heard about the biopsies in 3 weeks.    SIGNATURES/CONFIDENTIALITY: You and/or your care partner have signed paperwork which will be entered into your electronic medical record.  These signatures attest to the fact that that the information above on your After Visit Summary has been reviewed and is understood.  Full responsibility of the confidentiality of this discharge information lies with you and/or your care-partner.  

## 2023-01-02 ENCOUNTER — Telehealth: Payer: Self-pay | Admitting: *Deleted

## 2023-01-02 NOTE — Telephone Encounter (Signed)
  Follow up Call-     01/01/2023    9:44 AM  Call back number  Post procedure Call Back phone  # [phone]  Permission to leave phone message Yes     Patient questions:  Do you have a fever, pain , or abdominal swelling? No. Pain Score  0 *  Have you tolerated food without any problems? Yes.    Have you been able to return to your normal activities? Yes.    Do you have any questions about your discharge instructions: Diet   No. Medications  No. Follow up visit  No.  Do you have questions or concerns about your Care? No.  Actions: * If pain score is 4 or above: No action needed, pain <4.

## 2023-01-03 ENCOUNTER — Ambulatory Visit (INDEPENDENT_AMBULATORY_CARE_PROVIDER_SITE_OTHER): Payer: 59 | Admitting: Podiatry

## 2023-01-03 ENCOUNTER — Encounter: Payer: Self-pay | Admitting: Podiatry

## 2023-01-03 DIAGNOSIS — B351 Tinea unguium: Secondary | ICD-10-CM

## 2023-01-03 DIAGNOSIS — E1165 Type 2 diabetes mellitus with hyperglycemia: Secondary | ICD-10-CM | POA: Diagnosis not present

## 2023-01-03 NOTE — Progress Notes (Signed)
This patient returns to my office for at risk foot care.  This patient requires this care by a professional since this patient will be at risk due to having CKD and diabetes and CVA..  This patient is unable to cut nails himself since the patient cannot reach his nails.These nails are painful walking and wearing shoes.  This patient presents for at risk foot care today.  General Appearance  Alert, conversant and in no acute stress.  Vascular  Dorsalis pedis and posterior tibial  pulses are weakly palpable  bilaterally.  Capillary return is within normal limits  bilaterally. Temperature is within normal limits  bilaterally.  Neurologic  Senn-Weinstein monofilament wire test within normal limits  bilaterally. Muscle power within normal limits bilaterally.  Nails Thick disfigured discolored nails with subungual debris  from hallux to fifth toes bilaterally. No evidence of bacterial infection or drainage bilaterally.  Orthopedic  No limitations of motion  feet .  No crepitus or effusions noted.  No bony pathology or digital deformities noted.  Skin  dry scaly skin with no porokeratosis noted bilaterally.  No signs of infections or ulcers noted.     Onychomycosis  Pain in right toes  Pain in left toes  Consent was obtained for treatment procedures.   Mechanical debridement of nails 1-5  bilaterally performed with a nail nipper.  Filed with dremel without incident.    Return office visit   3 months                   Told patient to return for periodic foot care and evaluation due to potential at risk complications.   Helane Gunther DPM

## 2023-01-07 ENCOUNTER — Ambulatory Visit: Payer: 59 | Attending: Family Medicine | Admitting: Family Medicine

## 2023-01-07 VITALS — BP 127/67 | HR 81 | Ht 65.0 in | Wt 163.0 lb

## 2023-01-07 DIAGNOSIS — Z7984 Long term (current) use of oral hypoglycemic drugs: Secondary | ICD-10-CM | POA: Diagnosis not present

## 2023-01-07 DIAGNOSIS — I129 Hypertensive chronic kidney disease with stage 1 through stage 4 chronic kidney disease, or unspecified chronic kidney disease: Secondary | ICD-10-CM | POA: Diagnosis not present

## 2023-01-07 DIAGNOSIS — E1169 Type 2 diabetes mellitus with other specified complication: Secondary | ICD-10-CM | POA: Diagnosis not present

## 2023-01-07 DIAGNOSIS — K2100 Gastro-esophageal reflux disease with esophagitis, without bleeding: Secondary | ICD-10-CM

## 2023-01-07 DIAGNOSIS — G8110 Spastic hemiplegia affecting unspecified side: Secondary | ICD-10-CM

## 2023-01-07 DIAGNOSIS — E1122 Type 2 diabetes mellitus with diabetic chronic kidney disease: Secondary | ICD-10-CM | POA: Diagnosis not present

## 2023-01-07 DIAGNOSIS — N184 Chronic kidney disease, stage 4 (severe): Secondary | ICD-10-CM | POA: Diagnosis not present

## 2023-01-07 LAB — POCT GLYCOSYLATED HEMOGLOBIN (HGB A1C): HbA1c, POC (controlled diabetic range): 6.2 % (ref 0.0–7.0)

## 2023-01-07 MED ORDER — AMLODIPINE BESYLATE 10 MG PO TABS: 10.0000 mg | ORAL_TABLET | Freq: Every day | ORAL | 1 refills | Status: DC

## 2023-01-07 MED ORDER — FUROSEMIDE 80 MG PO TABS: 80.0000 mg | ORAL_TABLET | Freq: Every day | ORAL | 0 refills | Status: DC

## 2023-01-07 MED ORDER — GLIPIZIDE 5 MG PO TABS
5.0000 mg | ORAL_TABLET | Freq: Two times a day (BID) | ORAL | 1 refills | Status: DC
Start: 2023-01-07 — End: 2024-01-23

## 2023-01-07 MED ORDER — OMEPRAZOLE 40 MG PO CPDR: 40.0000 mg | DELAYED_RELEASE_CAPSULE | Freq: Every day | ORAL | 1 refills | Status: DC

## 2023-01-07 MED ORDER — HYDRALAZINE HCL 100 MG PO TABS
100.0000 mg | ORAL_TABLET | Freq: Three times a day (TID) | ORAL | 1 refills | Status: DC
Start: 2023-01-07 — End: 2023-11-12

## 2023-01-07 MED ORDER — CARVEDILOL 25 MG PO TABS: 25.0000 mg | ORAL_TABLET | Freq: Two times a day (BID) | ORAL | 1 refills | Status: DC

## 2023-01-07 NOTE — Progress Notes (Signed)
Subjective:  Patient ID: Tony Schmitt, male    DOB: Jun 09, 1971  Age: 52 y.o. MRN: [id number]  CC: Diabetes   HPI Angelos Ottmar Veitch is a 52 y.o. 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 He is adherent with his antihypertensive and his diuretic. Denies presence of hypoglycemia with his diabetic regimen and he has no neuropathy.  He has both Crestor and Lipitor on his med list and is unsure of which of them he is taking. Denies presence of additional concerns today. Past Medical History:  Diagnosis Date   Anemia    Chronic kidney disease    Diabetes mellitus without complication (HCC)    GERD (gastroesophageal reflux disease)    Hypertension    Sleep apnea    Stroke [facility])     Past Surgical History:  Procedure Laterality Date   BUBBLE STUDY  04/18/2020   Procedure: BUBBLE STUDY;  Surgeon: Quintella Reichert, MD;  Location: [facility] ENDOSCOPY;  Service: Cardiovascular;;   TEE WITHOUT CARDIOVERSION N/A 04/18/2020   Procedure: TRANSESOPHAGEAL ECHOCARDIOGRAM (TEE);  Surgeon: Quintella Reichert, MD;  Location[facility] ENDOSCOPY;  Service: Cardiovascular;  Laterality: N/A;    Family History  Problem Relation Age of Onset   Diabetes Mother    Diabetes Father    Hypertension Father    Cancer Father    Heart failure Other    Colon cancer Neg Hx    Colon polyps Neg Hx    Crohn's disease Neg Hx    Esophageal cancer Neg Hx    Rectal cancer Neg Hx    Stomach cancer Neg Hx    Ulcerative colitis Neg Hx     Social History   Socioeconomic History   Marital status: Single    Spouse name:  Not on file   Number of children: Not on file   Years of education: Not on file   Highest education level: GED or equivalent  Occupational History   Not on file  Tobacco Use   Smoking status: Former    Types: Cigarettes, Cigars   Smokeless tobacco: Never  Vaping Use   Vaping Use: Never used  Substance and Sexual Activity   Alcohol use: Not Currently   Drug use: Never   Sexual activity: Not on file  Other Topics Concern   Not on file  Social History Narrative   Not on file   Social Determinants of Health   Financial Resource Strain: Patient Declined (01/07/2023)   Overall Financial Resource Strain (CARDIA)    Difficulty of Paying Living Expenses: Patient declined  Food Insecurity: Patient Declined (01/07/2023)   Hunger Vital Sign    Worried About Running Out of Food in the Last Year: Patient declined    Ran Out of Food in the Last Year: Patient declined  Transportation Needs: No Transportation Needs (01/07/2023)   PRAPARE - Administrator, Civil Service (Medical): No    Lack of Transportation (Non-Medical): No  Physical Activity: Insufficiently Active (01/07/2023)   Exercise Vital Sign    Days of Exercise per Week: 3 days    Minutes of Exercise per Session: 40 min  Stress: Stress  Concern Present (01/07/2023)   [employer] of Occupational Health - Occupational Stress Questionnaire    Feeling of Stress : To some extent  Social Connections: Unknown (01/07/2023)   Social Connection and Isolation Panel [NHANES]    Frequency of Communication with Friends and Family: Twice a week    Frequency of Social Gatherings with Friends and Family: More than three times a week    Attends Religious Services: Patient declined    Database administrator or Organizations: No    Attends Engineer, structural: Not on file    Marital Status: Never married    Allergies  Allergen Reactions   Latex Itching    Outpatient Medications Prior to Visit  Medication Sig Dispense  Refill   acetaminophen (TYLENOL) 325 MG tablet Take 2 tablets (650 mg total) by mouth every 4 (four) hours as needed for mild pain (or temp > 37.5 C (99.5 F)).     aspirin 81 MG chewable tablet Chew 1 tablet (81 mg total) by mouth daily. 30 tablet 0   cloNIDine (CATAPRES) 0.1 MG tablet Take 0.1 mg by mouth at bedtime.     isosorbide mononitrate (IMDUR) 30 MG 24 hr tablet Take 1 tablet by mouth daily.     rosuvastatin (CRESTOR) 20 MG tablet Take 1 tablet (20 mg total) by mouth daily. 90 tablet 1   sodium bicarbonate 650 MG tablet Take by mouth 2 (two) times daily. TAKE 2 TABLETS 2 X A DAY     amLODipine (NORVASC) 10 MG tablet Take 1 tablet (10 mg total) by mouth daily. 90 tablet 1   atorvastatin (LIPITOR) 80 MG tablet Take 1 tablet by mouth daily.     carvedilol (COREG) 25 MG tablet Take 1 tablet (25 mg total) by mouth 2 (two) times daily with a meal. 180 tablet 1   furosemide (LASIX) 80 MG tablet Take 1 tablet by mouth once daily 90 tablet 0   glipiZIDE (GLUCOTROL) 5 MG tablet Take 1 tablet (5 mg total) by mouth 2 (two) times daily before a meal. 180 tablet 1   hydrALAZINE (APRESOLINE) 100 MG tablet Take 1 tablet (100 mg total) by mouth 3 (three) times daily. 270 tablet 1   omeprazole (PRILOSEC) 40 MG capsule Take 1 capsule (40 mg total) by mouth daily. 90 capsule 1   No facility-administered medications prior to visit.     ROS Review of Systems  Constitutional:  Negative for activity change and appetite change.  HENT:  Negative for sinus pressure and sore throat.   Respiratory:  Negative for chest tightness, shortness of breath and wheezing.   Cardiovascular:  Negative for chest pain and palpitations.  Gastrointestinal:  Negative for abdominal distention, abdominal pain and constipation.  Genitourinary: Negative.   Musculoskeletal: Negative.   Neurological:  Positive for weakness.  Psychiatric/Behavioral:  Negative for behavioral problems and dysphoric mood.     Objective:  BP  127/67   Pulse 81   Ht 5\' 5"  (1.651 m)   Wt 163 lb (73.9 kg)   SpO2 98%   BMI 27.12 kg/m      01/07/2023    3:07 PM 01/07/2023    2:43 PM 01/01/2023   11:15 AM  BP/Weight  Systolic BP 127 143 175  Diastolic BP 67 68 73  Wt. (Lbs)  163   BMI  27.12 kg/m2       Physical Exam Constitutional:      Appearance: He is well-developed.  Cardiovascular:     Rate  and Rhythm: Normal rate.     Heart sounds: Normal heart sounds. No murmur heard. Pulmonary:     Effort: Pulmonary effort is normal.     Breath sounds: Normal breath sounds. No wheezing or rales.  Chest:     Chest wall: No tenderness.  Abdominal:     General: Bowel sounds are normal. There is no distension.     Palpations: Abdomen is soft. There is no mass.     Tenderness: There is no abdominal tenderness.  Musculoskeletal:        General: Normal range of motion.     Right lower leg: No edema.     Left lower leg: No edema.     Comments: Left arm edema, left arm flexion deformity  Neurological:     Mental Status: He is alert and oriented to person, place, and time.  Psychiatric:        Mood and Affect: Mood normal.        Latest Ref Rng & Units 12/21/2022    1:35 PM 11/23/2022    1:49 PM 10/26/2022    2:00 PM  CMP  Glucose 70 - 99 mg/dL 161  096  87   BUN 6 - 20 mg/dL 58  55  49   Creatinine 0.61 - 1.24 mg/dL 0.45  4.09  8.11   Sodium 135 - 145 mmol/L 138  137  142   Potassium 3.5 - 5.1 mmol/L 3.7  3.7  3.5   Chloride 98 - 111 mmol/L 104  107  111   CO2 22 - 32 mmol/L 21  21  22    Calcium 8.9 - 10.3 mg/dL 9.6  9.2  9.6     Lipid Panel     Component Value Date/Time   CHOL 225 (H) 08/01/2022 1432   TRIG 158 (H) 08/01/2022 1432   HDL 47 08/01/2022 1432   CHOLHDL 17.5 04/16/2020 0558   VLDL 60 (H) 04/16/2020 0558   LDLCALC 149 (H) 08/01/2022 1432    CBC    Component Value Date/Time   WBC 8.7 12/21/2022 1335   RBC 3.89 (L) 12/21/2022 1335   HGB 10.3 (L) 12/21/2022 1335   HGB 9.8 (L) 06/07/2020 1551    HCT 32.5 (L) 12/21/2022 1335   HCT 30.3 (L) 06/07/2020 1551   PLT 311 12/21/2022 1335   PLT 471 (H) 06/07/2020 1551   MCV 83.5 12/21/2022 1335   MCV 84 06/07/2020 1551   MCH 26.5 12/21/2022 1335   MCHC 31.7 12/21/2022 1335   RDW 15.9 (H) 12/21/2022 1335   RDW 14.1 06/07/2020 1551   LYMPHSABS 2.0 12/21/2022 1335   LYMPHSABS 1.8 06/07/2020 1551   MONOABS 0.6 12/21/2022 1335   EOSABS 0.2 12/21/2022 1335   EOSABS 0.2 06/07/2020 1551   BASOSABS 0.1 12/21/2022 1335   BASOSABS 0.1 06/07/2020 1551    Lab Results  Component Value Date   HGBA1C 6.2 01/07/2023    Assessment & Plan:  1. Type 2 diabetes mellitus with other specified complication, without long-term current use of insulin (HCC) Controlled with A1c of 6.2 Continue current regimen - POCT glycosylated hemoglobin (Hb A1C) - LP+Non-HDL Cholesterol; Future - glipiZIDE (GLUCOTROL) 5 MG tablet; Take 1 tablet (5 mg total) by mouth 2 (two) times daily before a meal.  Dispense: 180 tablet; Refill: 1  2. Hypertension in stage 4 chronic kidney disease due to type 2 diabetes mellitus (HCC) Controlled Counseled on blood pressure goal of less than 130/80, low-sodium, DASH diet, medication compliance, 150  minutes of moderate intensity exercise per week. Discussed medication compliance, adverse effects. - amLODipine (NORVASC) 10 MG tablet; Take 1 tablet (10 mg total) by mouth daily.  Dispense: 90 tablet; Refill: 1 - carvedilol (COREG) 25 MG tablet; Take 1 tablet (25 mg total) by mouth 2 (two) times daily with a meal.  Dispense: 180 tablet; Refill: 1 - furosemide (LASIX) 80 MG tablet; Take 1 tablet (80 mg total) by mouth daily.  Dispense: 90 tablet; Refill: 0 - hydrALAZINE (APRESOLINE) 100 MG tablet; Take 1 tablet (100 mg total) by mouth 3 (three) times daily.  Dispense: 270 tablet; Refill: 1  3. Gastroesophageal reflux disease with esophagitis without hemorrhage Stable - omeprazole (PRILOSEC) 40 MG capsule; Take 1 capsule (40 mg total) by  mouth daily.  Dispense: 90 capsule; Refill: 1  4. Spastic hemiplegia, unspecified etiology, unspecified laterality (HCC) Left hemiparesis He continues to work on home exercises    Meds ordered this encounter  Medications   amLODipine (NORVASC) 10 MG tablet    Sig: Take 1 tablet (10 mg total) by mouth daily.    Dispense:  90 tablet    Refill:  1   carvedilol (COREG) 25 MG tablet    Sig: Take 1 tablet (25 mg total) by mouth 2 (two) times daily with a meal.    Dispense:  180 tablet    Refill:  1   furosemide (LASIX) 80 MG tablet    Sig: Take 1 tablet (80 mg total) by mouth daily.    Dispense:  90 tablet    Refill:  0   glipiZIDE (GLUCOTROL) 5 MG tablet    Sig: Take 1 tablet (5 mg total) by mouth 2 (two) times daily before a meal.    Dispense:  180 tablet    Refill:  1   hydrALAZINE (APRESOLINE) 100 MG tablet    Sig: Take 1 tablet (100 mg total) by mouth 3 (three) times daily.    Dispense:  270 tablet    Refill:  1   omeprazole (PRILOSEC) 40 MG capsule    Sig: Take 1 capsule (40 mg total) by mouth daily.    Dispense:  90 capsule    Refill:  1    Follow-up: No follow-ups on file.       Hoy Register, MD, FAAFP. [facility] [place], [place] [phone]   01/07/2023, 5:43 PM

## 2023-01-11 ENCOUNTER — Ambulatory Visit: Payer: 59 | Attending: Family Medicine

## 2023-01-11 DIAGNOSIS — E1169 Type 2 diabetes mellitus with other specified complication: Secondary | ICD-10-CM

## 2023-01-12 LAB — LP+NON-HDL CHOLESTEROL
Cholesterol, Total: 212 mg/dL — ABNORMAL HIGH (ref 100–199)
HDL: 61 mg/dL (ref >39)
LDL Chol Calc (NIH): 130 mg/dL — ABNORMAL HIGH (ref 0–99)
Total Non-HDL-Chol (LDL+VLDL): 151 mg/dL — ABNORMAL HIGH (ref 0–129)
Triglycerides: 116 mg/dL (ref 0–149)
VLDL Cholesterol Cal: 21 mg/dL (ref 5–40)

## 2023-01-15 ENCOUNTER — Other Ambulatory Visit: Payer: Self-pay | Admitting: Family Medicine

## 2023-01-15 MED ORDER — ROSUVASTATIN CALCIUM 40 MG PO TABS: 40.0000 mg | ORAL_TABLET | Freq: Every day | ORAL | 1 refills | Status: DC

## 2023-01-18 ENCOUNTER — Ambulatory Visit (HOSPITAL_COMMUNITY)
Admission: RE | Admit: 2023-01-18 | Discharge: 2023-01-18 | Disposition: A | Discharge status: Home / Self Care | Payer: 59 | Source: Ambulatory Visit | Attending: Nephrology | Admitting: Nephrology

## 2023-01-18 VITALS — BP 130/68 | HR 75 | Temp 97.5°F | Resp 17

## 2023-01-18 DIAGNOSIS — N183 Chronic kidney disease, stage 3 unspecified: Secondary | ICD-10-CM | POA: Diagnosis not present

## 2023-01-18 LAB — CBC WITH DIFFERENTIAL/PLATELET
Abs Immature Granulocytes: 0.03 10*3/uL (ref 0.00–0.07)
Basophils Absolute: 0.1 10*3/uL (ref 0.0–0.1)
Basophils Relative: 1 %
Eosinophils Absolute: 0.2 10*3/uL (ref 0.0–0.5)
Eosinophils Relative: 2 %
HCT: 32.6 % — ABNORMAL LOW (ref 39.0–52.0)
Hemoglobin: 10.5 g/dL — ABNORMAL LOW (ref 13.0–17.0)
Immature Granulocytes: 0 %
Lymphocytes Relative: 24 %
Lymphs Abs: 2.1 10*3/uL (ref 0.7–4.0)
MCH: 27.3 pg (ref 26.0–34.0)
MCHC: 32.2 g/dL (ref 30.0–36.0)
MCV: 84.9 fL (ref 80.0–100.0)
Monocytes Absolute: 0.5 10*3/uL (ref 0.1–1.0)
Monocytes Relative: 6 %
Neutro Abs: 5.8 10*3/uL (ref 1.7–7.7)
Neutrophils Relative %: 67 %
Platelets: 273 10*3/uL (ref 150–400)
RBC: 3.84 MIL/uL — ABNORMAL LOW (ref 4.22–5.81)
RDW: 15.3 % (ref 11.5–15.5)
WBC: 8.6 10*3/uL (ref 4.0–10.5)
nRBC: 0 % (ref 0.0–0.2)

## 2023-01-18 LAB — RENAL FUNCTION PANEL
Albumin: 3.4 g/dL — ABNORMAL LOW (ref 3.5–5.0)
Anion gap: 10 (ref 5–15)
BUN: 56 mg/dL — ABNORMAL HIGH (ref 6–20)
CO2: 21 mmol/L — ABNORMAL LOW (ref 22–32)
Calcium: 9.5 mg/dL (ref 8.9–10.3)
Chloride: 107 mmol/L (ref 98–111)
Creatinine, Ser: 5.69 mg/dL — ABNORMAL HIGH (ref 0.61–1.24)
GFR, Estimated: 11 mL/min — ABNORMAL LOW (ref >60)
Glucose, Bld: 207 mg/dL — ABNORMAL HIGH (ref 70–99)
Phosphorus: 3.8 mg/dL (ref 2.5–4.6)
Potassium: 3.4 mmol/L — ABNORMAL LOW (ref 3.5–5.1)
Sodium: 138 mmol/L (ref 135–145)

## 2023-01-18 LAB — POCT HEMOGLOBIN-HEMACUE: Hemoglobin: 10.5 g/dL — ABNORMAL LOW (ref 13.0–17.0)

## 2023-01-18 MED ORDER — EPOETIN ALFA-EPBX 10000 UNIT/ML IJ SOLN
20000.0000 [IU] | INTRAMUSCULAR | Status: DC
  Administered 2023-01-18: 20000 [IU] via SUBCUTANEOUS

## 2023-01-18 MED ORDER — EPOETIN ALFA-EPBX 10000 UNIT/ML IJ SOLN
INTRAMUSCULAR | Status: AC
  Filled 2023-01-18: qty 2

## 2023-01-18 NOTE — Progress Notes (Signed)
Patient called back- verified medication change-dosing and pharmacy.

## 2023-02-15 ENCOUNTER — Ambulatory Visit (HOSPITAL_COMMUNITY)
Admission: RE | Admit: 2023-02-15 | Discharge: 2023-02-15 | Disposition: A | Discharge status: Home / Self Care | Payer: 59 | Source: Ambulatory Visit | Attending: Nephrology | Admitting: Nephrology

## 2023-02-15 VITALS — BP 125/70 | HR 72 | Temp 97.4°F | Resp 17

## 2023-02-15 DIAGNOSIS — N183 Chronic kidney disease, stage 3 unspecified: Secondary | ICD-10-CM | POA: Diagnosis not present

## 2023-02-15 LAB — RENAL FUNCTION PANEL
Albumin: 3.5 g/dL (ref 3.5–5.0)
Anion gap: 11 (ref 5–15)
BUN: 61 mg/dL — ABNORMAL HIGH (ref 6–20)
CO2: 21 mmol/L — ABNORMAL LOW (ref 22–32)
Calcium: 9.8 mg/dL (ref 8.9–10.3)
Chloride: 105 mmol/L (ref 98–111)
Creatinine, Ser: 5.61 mg/dL — ABNORMAL HIGH (ref 0.61–1.24)
GFR, Estimated: 11 mL/min — ABNORMAL LOW (ref >60)
Glucose, Bld: 176 mg/dL — ABNORMAL HIGH (ref 70–99)
Phosphorus: 4.2 mg/dL (ref 2.5–4.6)
Potassium: 4.4 mmol/L (ref 3.5–5.1)
Sodium: 137 mmol/L (ref 135–145)

## 2023-02-15 LAB — POCT HEMOGLOBIN-HEMACUE: Hemoglobin: 11.6 g/dL — ABNORMAL LOW (ref 13.0–17.0)

## 2023-02-15 LAB — CBC WITH DIFFERENTIAL/PLATELET
Abs Immature Granulocytes: 0.03 10*3/uL (ref 0.00–0.07)
Basophils Absolute: 0.1 10*3/uL (ref 0.0–0.1)
Basophils Relative: 1 %
Eosinophils Absolute: 0.3 10*3/uL (ref 0.0–0.5)
Eosinophils Relative: 3 %
HCT: 35.8 % — ABNORMAL LOW (ref 39.0–52.0)
Hemoglobin: 11.5 g/dL — ABNORMAL LOW (ref 13.0–17.0)
Immature Granulocytes: 0 %
Lymphocytes Relative: 24 %
Lymphs Abs: 2.1 10*3/uL (ref 0.7–4.0)
MCH: 27.5 pg (ref 26.0–34.0)
MCHC: 32.1 g/dL (ref 30.0–36.0)
MCV: 85.6 fL (ref 80.0–100.0)
Monocytes Absolute: 0.5 10*3/uL (ref 0.1–1.0)
Monocytes Relative: 6 %
Neutro Abs: 5.9 10*3/uL (ref 1.7–7.7)
Neutrophils Relative %: 66 %
Platelets: 305 10*3/uL (ref 150–400)
RBC: 4.18 MIL/uL — ABNORMAL LOW (ref 4.22–5.81)
RDW: 15.1 % (ref 11.5–15.5)
WBC: 8.8 10*3/uL (ref 4.0–10.5)
nRBC: 0 % (ref 0.0–0.2)

## 2023-02-15 LAB — FERRITIN: Ferritin: 477 ng/mL — ABNORMAL HIGH (ref 24–336)

## 2023-02-15 LAB — IRON AND TIBC
Iron: 63 ug/dL (ref 45–182)
Saturation Ratios: 25 % (ref 17.9–39.5)
TIBC: 256 ug/dL (ref 250–450)
UIBC: 193 ug/dL

## 2023-02-15 MED ORDER — EPOETIN ALFA-EPBX 10000 UNIT/ML IJ SOLN
20000.0000 [IU] | INTRAMUSCULAR | Status: DC
  Administered 2023-02-15: 20000 [IU] via SUBCUTANEOUS

## 2023-02-15 MED ORDER — EPOETIN ALFA-EPBX 10000 UNIT/ML IJ SOLN
INTRAMUSCULAR | Status: AC
  Filled 2023-02-15: qty 2

## 2023-02-19 DIAGNOSIS — I120 Hypertensive chronic kidney disease with stage 5 chronic kidney disease or end stage renal disease: Secondary | ICD-10-CM | POA: Diagnosis not present

## 2023-02-19 DIAGNOSIS — E1122 Type 2 diabetes mellitus with diabetic chronic kidney disease: Secondary | ICD-10-CM | POA: Diagnosis not present

## 2023-02-19 DIAGNOSIS — D631 Anemia in chronic kidney disease: Secondary | ICD-10-CM | POA: Diagnosis not present

## 2023-02-19 DIAGNOSIS — N185 Chronic kidney disease, stage 5: Secondary | ICD-10-CM | POA: Diagnosis not present

## 2023-02-19 DIAGNOSIS — I639 Cerebral infarction, unspecified: Secondary | ICD-10-CM | POA: Diagnosis not present

## 2023-02-19 DIAGNOSIS — N2581 Secondary hyperparathyroidism of renal origin: Secondary | ICD-10-CM | POA: Diagnosis not present

## 2023-03-15 ENCOUNTER — Ambulatory Visit (HOSPITAL_COMMUNITY): Admission: RE | Admit: 2023-03-15 | Payer: 59 | Source: Ambulatory Visit

## 2023-03-15 VITALS — BP 134/72 | HR 75 | Temp 97.6°F | Resp 17

## 2023-03-15 DIAGNOSIS — N183 Chronic kidney disease, stage 3 unspecified: Secondary | ICD-10-CM | POA: Insufficient documentation

## 2023-03-15 LAB — RENAL FUNCTION PANEL
Albumin: 3.6 g/dL (ref 3.5–5.0)
Anion gap: 12 (ref 5–15)
BUN: 61 mg/dL — ABNORMAL HIGH (ref 6–20)
CO2: 20 mmol/L — ABNORMAL LOW (ref 22–32)
Calcium: 9.9 mg/dL (ref 8.9–10.3)
Chloride: 105 mmol/L (ref 98–111)
Creatinine, Ser: 5.86 mg/dL — ABNORMAL HIGH (ref 0.61–1.24)
GFR, Estimated: 11 mL/min — ABNORMAL LOW (ref >60)
Glucose, Bld: 113 mg/dL — ABNORMAL HIGH (ref 70–99)
Phosphorus: 4.3 mg/dL (ref 2.5–4.6)
Potassium: 4.1 mmol/L (ref 3.5–5.1)
Sodium: 137 mmol/L (ref 135–145)

## 2023-03-15 LAB — CBC WITH DIFFERENTIAL/PLATELET
Abs Immature Granulocytes: 0.02 10*3/uL (ref 0.00–0.07)
Basophils Absolute: 0.1 10*3/uL (ref 0.0–0.1)
Basophils Relative: 1 %
Eosinophils Absolute: 0.2 10*3/uL (ref 0.0–0.5)
Eosinophils Relative: 2 %
HCT: 35.7 % — ABNORMAL LOW (ref 39.0–52.0)
Hemoglobin: 11.4 g/dL — ABNORMAL LOW (ref 13.0–17.0)
Immature Granulocytes: 0 %
Lymphocytes Relative: 27 %
Lymphs Abs: 2.2 10*3/uL (ref 0.7–4.0)
MCH: 26.6 pg (ref 26.0–34.0)
MCHC: 31.9 g/dL (ref 30.0–36.0)
MCV: 83.2 fL (ref 80.0–100.0)
Monocytes Absolute: 0.6 10*3/uL (ref 0.1–1.0)
Monocytes Relative: 7 %
Neutro Abs: 5.1 10*3/uL (ref 1.7–7.7)
Neutrophils Relative %: 63 %
Platelets: 287 10*3/uL (ref 150–400)
RBC: 4.29 MIL/uL (ref 4.22–5.81)
RDW: 14.8 % (ref 11.5–15.5)
WBC: 8.2 10*3/uL (ref 4.0–10.5)
nRBC: 0 % (ref 0.0–0.2)

## 2023-03-15 LAB — POCT HEMOGLOBIN-HEMACUE: Hemoglobin: 11.4 g/dL — ABNORMAL LOW (ref 13.0–17.0)

## 2023-03-15 MED ORDER — EPOETIN ALFA-EPBX 10000 UNIT/ML IJ SOLN
INTRAMUSCULAR | Status: AC
  Administered 2023-03-15: 20000 [IU] via SUBCUTANEOUS
  Filled 2023-03-15: qty 2

## 2023-03-15 MED ORDER — EPOETIN ALFA-EPBX 10000 UNIT/ML IJ SOLN: 20000.0000 [IU] | INTRAMUSCULAR | Status: DC

## 2023-03-18 ENCOUNTER — Other Ambulatory Visit: Payer: Self-pay | Admitting: Family Medicine

## 2023-03-20 ENCOUNTER — Ambulatory Visit: Payer: Self-pay

## 2023-03-20 ENCOUNTER — Other Ambulatory Visit: Payer: Self-pay | Admitting: Family Medicine

## 2023-03-20 MED ORDER — POLYETHYLENE GLYCOL 3350 17 GM/SCOOP PO POWD: 17.0000 g | Freq: Every day | ORAL | 1 refills | Status: AC

## 2023-03-20 NOTE — Telephone Encounter (Signed)
Spoke with patient . Patient advised that  prescription for MiraLAX was sent to his pharmacy.

## 2023-03-20 NOTE — Telephone Encounter (Signed)
I have sent a prescription for MiraLAX to his pharmacy.

## 2023-03-20 NOTE — Telephone Encounter (Signed)
  Pt reports that he has been constipated for 1 week and 1 day and request that a Rx for a medication be sent to [id number] - [place] ([place]), [place] - [address]. Cb# [phone]     Chief Complaint: Last bowel movement 1 week ago. Has tried Miralax and Dulcolax. Wants medication sent to pharmacy Symptoms: Above Frequency: 1 week Pertinent Negatives: Patient denies any pain or bloating Disposition: [] ED /[] Urgent Care (no appt availability in office) / [] Appointment(In office/virtual)/ []  [place] [facility]/ [] Home Care/ [x] Refused Recommended Disposition /[] [place] [facility]/ []  Follow-up with PCP Additional Notes: Please advise pt.   Reason for Disposition  [1] Constipation persists > 1 week AND [2] no improvement after using Care Advice  Answer Assessment - Initial Assessment Questions 1. STOOL PATTERN OR FREQUENCY: "How often do you have a bowel movement (BM)?"  (Normal range: 3 times a day to every 3 days)  "When was your last BM?"       Every 4 days  2. STRAINING: "Do you have to strain to have a BM?"      No 3. RECTAL PAIN: "Does your rectum hurt when the stool comes out?" If Yes, ask: "Do you have hemorrhoids? How bad is the pain?"  (Scale 1-10; or mild, moderate, severe)     No 4. STOOL COMPOSITION: "Are the stools hard?"      N/a 5. BLOOD ON STOOLS: "Has there been any blood on the toilet tissue or on the surface of the BM?" If Yes, ask: "When was the last time?"     No 6. CHRONIC CONSTIPATION: "Is this a new problem for you?"  If No, ask: "How long have you had this problem?" (days, weeks, months)      Yes 7. CHANGES IN DIET OR HYDRATION: "Have there been any recent changes in your diet?" "How much fluids are you drinking on a daily basis?"  "How much have you had to drink today?"     No 8. MEDICINES: "Have you been taking any new medicines?" "Are you taking any narcotic pain medicines?" (e.g., Dilaudid, morphine, Percocet, Vicodin)     No 9.  LAXATIVES: "Have you been using any stool softeners, laxatives, or enemas?"  If Yes, ask "What, how often, and when was the last time?"     Yes - Miralax, Dulcolax  10. ACTIVITY:  "How much walking do you do every day?"  "Has your activity level decreased in the past week?"        No 11. CAUSE: "What do you think is causing the constipation?"        Unsure 12. OTHER SYMPTOMS: "Do you have any other symptoms?" (e.g., abdomen pain, bloating, fever, vomiting)       No 13. MEDICAL HISTORY: "Do you have a history of hemorrhoids, rectal fissures, or rectal surgery or rectal abscess?"         No 14. PREGNANCY: "Is there any chance you are pregnant?" "When was your last menstrual period?"       N/a  Protocols used: Constipation-A-AH

## 2023-03-20 NOTE — Telephone Encounter (Signed)
Medication Refill - Medication: sodium bicarbonate 650 MG tablet   Has the patient contacted their pharmacy? Yes.   (Agent: If no, request that the patient contact the pharmacy for the refill. If patient does not wish to contact the pharmacy document the reason why and proceed with request.) (Agent: If yes, when and what did the pharmacy advise?)  Preferred Pharmacy (with phone number or street name):  [facility] [address] [place]), [place] - [id number] Phone: [phone]  Fax: [phone]     Has the patient been seen for an appointment in the last year OR does the patient have an upcoming appointment? Yes.    Agent: Please be advised that RX refills may take up to 3 business days. We ask that you follow-up with your pharmacy.  Patient is not seeing previous prescriber. Patient would like medication to be from pcp. Please advise.

## 2023-03-21 ENCOUNTER — Telehealth: Payer: Self-pay | Admitting: Family Medicine

## 2023-03-21 NOTE — Telephone Encounter (Signed)
VM left informing patient that medication has been sent to pharmacy

## 2023-03-21 NOTE — Telephone Encounter (Signed)
Copied from CRM [id number]. Topic: General - Other >> Mar 21, 2023 12:41 PM Franchot Heidelberg wrote: Reason for CRM: Pt's mother called to check status of refill request

## 2023-03-21 NOTE — Telephone Encounter (Signed)
Requested medication (s) are due for refill today: routing for review  Requested medication (s) are on the active medication list: yes  Last refill:  09/13/22  Future visit scheduled: no  Notes to clinic:  Unable to refill per protocol, last refill by historical provider. Routing to PCP for approval.     Requested Prescriptions  Pending Prescriptions Disp Refills   sodium bicarbonate 650 MG tablet      Sig: Take by mouth 2 (two) times daily. TAKE 2 TABLETS 2 X A DAY     Endocrinology:  Minerals 2 Failed - 03/20/2023  1:15 PM      Failed - Mg Level in normal range and within 180 days    Magnesium  Date Value Ref Range Status  06/11/2020 1.5 (L) 1.7 - 2.4 mg/dL Final    Comment:    Performed at [facility], [address], [place], [place] [address]         Failed - BMP within normal limits in the last 6 months    Glucose, Bld  Date Value Ref Range Status  03/15/2023 113 (H) 70 - 99 mg/dL Final    Comment:    Glucose reference range applies only to samples taken after fasting for at least 8 hours.   POC Glucose  Date Value Ref Range Status  01/31/2022 132 (A) 70 - 99 mg/dl Final   Glucose-Capillary  Date Value Ref Range Status  06/14/2020 136 (H) 70 - 99 mg/dL Final    Comment:    Glucose reference range applies only to samples taken after fasting for at least 8 hours.   Calcium  Date Value Ref Range Status  03/15/2023 9.9 8.9 - 10.3 mg/dL Final   Calcium, Ion  Date Value Ref Range Status  04/16/2020 1.30 1.15 - 1.40 mmol/L Final   Sodium  Date Value Ref Range Status  03/15/2023 137 135 - 145 mmol/L Final  02/07/2022 140 134 - 144 mmol/L Final   Potassium  Date Value Ref Range Status  03/15/2023 4.1 3.5 - 5.1 mmol/L Final   Chloride  Date Value Ref Range Status  03/15/2023 105 98 - 111 mmol/L Final   BUN  Date Value Ref Range Status  03/15/2023 61 (H) 6 - 20 mg/dL Final  62/13/0865 47 (H) 6 - 24 mg/dL Final   Creatinine, Ser  Date Value Ref  Range Status  03/15/2023 5.86 (H) 0.61 - 1.24 mg/dL Final   Creatinine, Urine  Date Value Ref Range Status  06/11/2020 87.72 mg/dL Final    Comment:    Performed at [facility], [address] [place], [place] [address]   CO2  Date Value Ref Range Status  03/15/2023 20 (L) 22 - 32 mmol/L Final   TCO2  Date Value Ref Range Status  04/16/2020 23 22 - 32 mmol/L Final   GFR calc Af Amer  Date Value Ref Range Status  06/07/2020 23 (L) >59 mL/min/1.73 Final    Comment:    **In accordance with recommendations from the NKF-ASN Task force,**   [facility] is in the process of updating its eGFR calculation to the   2021 CKD-EPI creatinine equation that estimates kidney function   without a race variable.    eGFR  Date Value Ref Range Status  02/07/2022 16 (L) >59 mL/min/1.73 Final   GFR, Estimated  Date Value Ref Range Status  03/15/2023 11 (L) >60 mL/min Final    Comment:    (NOTE) Calculated using the  CKD-EPI Creatinine Equation (2021)          Passed - Valid encounter within last 12 months    Recent Outpatient Visits           2 months ago Type 2 diabetes mellitus with other specified complication, without long-term current use of insulin (HCC)   [facility] [place], [place], MD   7 months ago Type 2 diabetes mellitus with other specified complication, without long-term current use of insulin (HCC)   [facility] [place], [place], MD   1 year ago Type 2 diabetes mellitus with other specified complication, without long-term current use of insulin (HCC)   [place] [facility] [place], Odette Horns, MD   1 year ago Hypertension in stage 4 chronic kidney disease due to type 2 diabetes mellitus [facility])   [place] [facility] [place], Jarratt L, RPH-CPP   1 year ago Hypertension in stage 4 chronic kidney disease due to type 2 diabetes mellitus [facility])   [facility] [place], Cornelius Moras, RPH-CPP

## 2023-03-22 ENCOUNTER — Other Ambulatory Visit: Payer: Self-pay | Admitting: Family Medicine

## 2023-03-22 NOTE — Telephone Encounter (Signed)
Medication Refill - Medication: sodium bicarbonate 650 MG tablet   Has the patient contacted their pharmacy? Yes.   Pt told to contact provider  Preferred Pharmacy (with phone number or street name):  [facility] [address] [place]),  - [id number] Phone: [phone]  Fax: [phone]     Has the patient been seen for an appointment in the last year OR does the patient have an upcoming appointment? Yes.    Agent: Please be advised that RX refills may take up to 3 business days. We ask that you follow-up with your pharmacy.

## 2023-03-25 ENCOUNTER — Telehealth: Payer: Self-pay | Admitting: Family Medicine

## 2023-03-25 ENCOUNTER — Other Ambulatory Visit: Payer: Self-pay | Admitting: Family Medicine

## 2023-03-25 NOTE — Telephone Encounter (Signed)
Requested medication (s) are due for refill today:-  Requested medication (s) are on the active medication list: historical med   Last refill:  09/13/22  Future visit scheduled: no  Notes to clinic:  historical provider   Requested Prescriptions  Pending Prescriptions Disp Refills   sodium bicarbonate 650 MG tablet      Sig: Take by mouth 2 (two) times daily. TAKE 2 TABLETS 2 X A DAY     Endocrinology:  Minerals 2 Failed - 03/22/2023  2:05 PM      Failed - Mg Level in normal range and within 180 days    Magnesium  Date Value Ref Range Status  06/11/2020 1.5 (L) 1.7 - 2.4 mg/dL Final    Comment:    Performed at [facility], [address] [place], [place] [address]         Failed - BMP within normal limits in the last 6 months    Glucose, Bld  Date Value Ref Range Status  03/15/2023 113 (H) 70 - 99 mg/dL Final    Comment:    Glucose reference range applies only to samples taken after fasting for at least 8 hours.   POC Glucose  Date Value Ref Range Status  01/31/2022 132 (A) 70 - 99 mg/dl Final   Glucose-Capillary  Date Value Ref Range Status  06/14/2020 136 (H) 70 - 99 mg/dL Final    Comment:    Glucose reference range applies only to samples taken after fasting for at least 8 hours.   Calcium  Date Value Ref Range Status  03/15/2023 9.9 8.9 - 10.3 mg/dL Final   Calcium, Ion  Date Value Ref Range Status  04/16/2020 1.30 1.15 - 1.40 mmol/L Final   Sodium  Date Value Ref Range Status  03/15/2023 137 135 - 145 mmol/L Final  02/07/2022 140 134 - 144 mmol/L Final   Potassium  Date Value Ref Range Status  03/15/2023 4.1 3.5 - 5.1 mmol/L Final   Chloride  Date Value Ref Range Status  03/15/2023 105 98 - 111 mmol/L Final   BUN  Date Value Ref Range Status  03/15/2023 61 (H) 6 - 20 mg/dL Final  83/15/1761 47 (H) 6 - 24 mg/dL Final   Creatinine, Ser  Date Value Ref Range Status  03/15/2023 5.86 (H) 0.61 - 1.24 mg/dL Final   Creatinine, Urine  Date  Value Ref Range Status  06/11/2020 87.72 mg/dL Final    Comment:    Performed at [facility], [address] [place], [place] [address]   CO2  Date Value Ref Range Status  03/15/2023 20 (L) 22 - 32 mmol/L Final   TCO2  Date Value Ref Range Status  04/16/2020 23 22 - 32 mmol/L Final   GFR calc Af Amer  Date Value Ref Range Status  06/07/2020 23 (L) >59 mL/min/1.73 Final    Comment:    **In accordance with recommendations from the NKF-ASN Task force,**   [facility] is in the process of updating its eGFR calculation to the   2021 CKD-EPI creatinine equation that estimates kidney function   without a race variable.    eGFR  Date Value Ref Range Status  02/07/2022 16 (L) >59 mL/min/1.73 Final   GFR, Estimated  Date Value Ref Range Status  03/15/2023 11 (L) >60 mL/min Final    Comment:    (NOTE) Calculated using the CKD-EPI Creatinine Equation (2021)          Passed - Valid  encounter within last 12 months    Recent Outpatient Visits           2 months ago Type 2 diabetes mellitus with other specified complication, without long-term current use of insulin (HCC)   [place] [facility] [place], [place], MD   7 months ago Type 2 diabetes mellitus with other specified complication, without long-term current use of insulin (HCC)   [place] [facility] [place], [place], MD   1 year ago Type 2 diabetes mellitus with other specified complication, without long-term current use of insulin (HCC)   [place] [facility] [place], Odette Horns, MD   1 year ago Hypertension in stage 4 chronic kidney disease due to type 2 diabetes mellitus [facility])   [place] [facility] [place], [place] L, RPH-CPP   1 year ago Hypertension in stage 4 chronic kidney disease due to type 2 diabetes mellitus [facility])   [facility] [place], Cornelius Moras, RPH-CPP

## 2023-03-25 NOTE — Telephone Encounter (Signed)
Medication Refill - Medication: sodium bicarbonate 650 MG tablet    Pt mom states that PCP is the original prescriber of the medication, he has been taking it for 3 years. Pt mom states the prescription was prescribed by a different doctor when he was in [facility] getting a Kidney Transplant. Pt mom states he is out of the medication and is wanting to know if he still need to be taking the medication or not and if so can his PCP start back doing his refills.    Has the patient contacted their pharmacy? Yes  Preferred Pharmacy (with phone number or street name): [facility] [address] [place]), [place] - [address]  Phone: [phone]  Has the patient been seen for an appointment in the last year OR does the patient have an upcoming appointment? Yes.    Agent: Please be advised that RX refills may take up to 3 business days. We ask that you follow-up with your pharmacy.

## 2023-03-25 NOTE — Telephone Encounter (Signed)
Dr. Laural Benes,   Dr. Alvis Lemmings has refused this stating his Nephrologist needs to fill and he's requesting again. Just wanted to run this by you since I can't fill it. Is this something you are able to approve or will he need to try and reach his Nephrologist?

## 2023-03-26 NOTE — Telephone Encounter (Signed)
Marcine Matar, MD  Ronette Deter, CMA; Vic Blackbird, RN14 hours ago (9:01 PM)    Let pt know that he should call his nephrologist Dr. Lewis Moccasin with [facility] to inquire where he should remain on this medication and if so send Rfs. Their # is [phone].  Patient advised of message per Dr. Laural Benes.  Patient verbalized understanding.

## 2023-04-05 ENCOUNTER — Encounter: Payer: Self-pay | Admitting: Podiatry

## 2023-04-05 ENCOUNTER — Ambulatory Visit: Payer: 59 | Admitting: Podiatry

## 2023-04-05 DIAGNOSIS — B351 Tinea unguium: Secondary | ICD-10-CM | POA: Diagnosis not present

## 2023-04-05 DIAGNOSIS — E1165 Type 2 diabetes mellitus with hyperglycemia: Secondary | ICD-10-CM | POA: Diagnosis not present

## 2023-04-05 NOTE — Progress Notes (Signed)
This patient returns to my office for at risk foot care.  This patient requires this care by a professional since this patient will be at risk due to having CKD and diabetes and CVA..  This patient is unable to cut nails himself since the patient cannot reach his nails.These nails are painful walking and wearing shoes.  This patient presents for at risk foot care today.  General Appearance  Alert, conversant and in no acute stress.  Vascular  Dorsalis pedis and posterior tibial  pulses are weakly palpable  bilaterally.  Capillary return is within normal limits  bilaterally. Temperature is within normal limits  bilaterally.  Neurologic  Senn-Weinstein monofilament wire test within normal limits  bilaterally. Muscle power within normal limits bilaterally.  Nails Thick disfigured discolored nails with subungual debris  from hallux to fifth toes bilaterally. No evidence of bacterial infection or drainage bilaterally.  Orthopedic  No limitations of motion  feet .  No crepitus or effusions noted.  No bony pathology or digital deformities noted.  Skin  dry scaly skin with no porokeratosis noted bilaterally.  No signs of infections or ulcers noted.     Onychomycosis  Pain in right toes  Pain in left toes  Consent was obtained for treatment procedures.   Mechanical debridement of nails 1-5  bilaterally performed with a nail nipper.  Filed with dremel without incident.    Return office visit   3 months                   Told patient to return for periodic foot care and evaluation due to potential at risk complications.   Helane Gunther DPM

## 2023-04-07 ENCOUNTER — Encounter (HOSPITAL_COMMUNITY): Payer: Self-pay

## 2023-04-12 ENCOUNTER — Ambulatory Visit (HOSPITAL_COMMUNITY)
Admission: RE | Admit: 2023-04-12 | Discharge: 2023-04-12 | Disposition: A | Discharge status: Home / Self Care | Payer: 59 | Source: Ambulatory Visit | Attending: Nephrology | Admitting: Nephrology

## 2023-04-12 VITALS — BP 135/76 | HR 72 | Temp 97.2°F | Resp 17

## 2023-04-12 DIAGNOSIS — N183 Chronic kidney disease, stage 3 unspecified: Secondary | ICD-10-CM | POA: Insufficient documentation

## 2023-04-12 LAB — POCT HEMOGLOBIN-HEMACUE: Hemoglobin: 11.5 g/dL — ABNORMAL LOW (ref 13.0–17.0)

## 2023-04-12 LAB — CBC WITH DIFFERENTIAL/PLATELET
Abs Immature Granulocytes: 0.04 K/uL (ref 0.00–0.07)
Basophils Absolute: 0.1 K/uL (ref 0.0–0.1)
Basophils Relative: 1 %
Eosinophils Absolute: 0.2 K/uL (ref 0.0–0.5)
Eosinophils Relative: 2 %
HCT: 35.6 % — ABNORMAL LOW (ref 39.0–52.0)
Hemoglobin: 11.3 g/dL — ABNORMAL LOW (ref 13.0–17.0)
Immature Granulocytes: 1 %
Lymphocytes Relative: 23 %
Lymphs Abs: 1.9 K/uL (ref 0.7–4.0)
MCH: 27 pg (ref 26.0–34.0)
MCHC: 31.7 g/dL (ref 30.0–36.0)
MCV: 85.2 fL (ref 80.0–100.0)
Monocytes Absolute: 0.6 K/uL (ref 0.1–1.0)
Monocytes Relative: 7 %
Neutro Abs: 5.8 K/uL (ref 1.7–7.7)
Neutrophils Relative %: 66 %
Platelets: 279 K/uL (ref 150–400)
RBC: 4.18 MIL/uL — ABNORMAL LOW (ref 4.22–5.81)
RDW: 14.7 % (ref 11.5–15.5)
WBC: 8.6 K/uL (ref 4.0–10.5)
nRBC: 0 % (ref 0.0–0.2)

## 2023-04-12 LAB — IRON AND TIBC
Iron: 76 ug/dL (ref 45–182)
Saturation Ratios: 28 % (ref 17.9–39.5)
TIBC: 269 ug/dL (ref 250–450)
UIBC: 193 ug/dL

## 2023-04-12 LAB — RENAL FUNCTION PANEL
Albumin: 3.8 g/dL (ref 3.5–5.0)
Anion gap: 15 (ref 5–15)
BUN: 59 mg/dL — ABNORMAL HIGH (ref 6–20)
CO2: 21 mmol/L — ABNORMAL LOW (ref 22–32)
Calcium: 10.2 mg/dL (ref 8.9–10.3)
Chloride: 105 mmol/L (ref 98–111)
Creatinine, Ser: 5.83 mg/dL — ABNORMAL HIGH (ref 0.61–1.24)
GFR, Estimated: 11 mL/min — ABNORMAL LOW (ref >60)
Glucose, Bld: 90 mg/dL (ref 70–99)
Phosphorus: 4.3 mg/dL (ref 2.5–4.6)
Potassium: 4 mmol/L (ref 3.5–5.1)
Sodium: 141 mmol/L (ref 135–145)

## 2023-04-12 LAB — FERRITIN: Ferritin: 515 ng/mL — ABNORMAL HIGH (ref 24–336)

## 2023-04-12 MED ORDER — EPOETIN ALFA-EPBX 10000 UNIT/ML IJ SOLN
20000.0000 [IU] | INTRAMUSCULAR | Status: DC
  Administered 2023-04-12: 20000 [IU] via SUBCUTANEOUS

## 2023-04-12 MED ORDER — EPOETIN ALFA-EPBX 10000 UNIT/ML IJ SOLN
INTRAMUSCULAR | Status: AC
  Filled 2023-04-12: qty 2

## 2023-04-22 DIAGNOSIS — E872 Acidosis, unspecified: Secondary | ICD-10-CM | POA: Diagnosis not present

## 2023-04-22 DIAGNOSIS — I639 Cerebral infarction, unspecified: Secondary | ICD-10-CM | POA: Diagnosis not present

## 2023-04-22 DIAGNOSIS — I120 Hypertensive chronic kidney disease with stage 5 chronic kidney disease or end stage renal disease: Secondary | ICD-10-CM | POA: Diagnosis not present

## 2023-04-22 DIAGNOSIS — D631 Anemia in chronic kidney disease: Secondary | ICD-10-CM | POA: Diagnosis not present

## 2023-04-22 DIAGNOSIS — N185 Chronic kidney disease, stage 5: Secondary | ICD-10-CM | POA: Diagnosis not present

## 2023-04-22 DIAGNOSIS — E1122 Type 2 diabetes mellitus with diabetic chronic kidney disease: Secondary | ICD-10-CM | POA: Diagnosis not present

## 2023-04-22 DIAGNOSIS — N2581 Secondary hyperparathyroidism of renal origin: Secondary | ICD-10-CM | POA: Diagnosis not present

## 2023-04-23 LAB — LAB REPORT - SCANNED: EGFR: 11

## 2023-04-25 ENCOUNTER — Encounter: Payer: Self-pay | Admitting: Vascular Surgery

## 2023-04-25 ENCOUNTER — Other Ambulatory Visit: Payer: Self-pay

## 2023-04-25 ENCOUNTER — Ambulatory Visit (INDEPENDENT_AMBULATORY_CARE_PROVIDER_SITE_OTHER): Payer: 59 | Admitting: Vascular Surgery

## 2023-04-25 VITALS — BP 150/79 | HR 73 | Temp 97.9°F | Resp 20 | Ht 65.0 in | Wt 160.0 lb

## 2023-04-25 DIAGNOSIS — N185 Chronic kidney disease, stage 5: Secondary | ICD-10-CM | POA: Diagnosis not present

## 2023-04-25 NOTE — Progress Notes (Signed)
REASON FOR VISIT:   CKD 5  MEDICAL ISSUES:   STAGE V CHRONIC KIDNEY DISEASE: The patient was referred for placement of new access.  Based on his previous evaluation I felt that he was likely not a candidate for a fistula.  He is now nearing the need for dialysis and we have been asked to proceed with placement of an AV graft.  Based on his previous vein map in 2022 he did not have appear to have adequate vein for a fistula however I have explained that I will look myself at the time of surgery.  He will likely require placement of an AV graft in the right arm.  On the left side he has a contracture and I do not think that he can rotate his arm out laterally for dialysis.  He does not have a pacemaker he is not on any blood thinners.  His surgery has been scheduled for 04/30/2023.  HPI:   Tony Schmitt is a pleasant 52 y.o. male who I previously saw back on 05/10/2022 with stage V chronic kidney disease.  At that time we were asked to place a fistula if possible.  If the fistula was not possible we were asked to not proceed with placement of an AV graft.  His kidney function is worsened and we have been asked to proceed with placement of an AV graft.  He denies any recent uremic symptoms.  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  Past Medical History:  Diagnosis Date   Anemia    Chronic kidney disease    Diabetes mellitus without complication (HCC)    GERD (gastroesophageal reflux disease)    Hypertension    Sleep apnea    Stroke (HCC)     Family History  Problem Relation Age of Onset   Diabetes Mother    Diabetes Father    Hypertension Father    Cancer Father    Heart failure Other    Colon cancer Neg Hx    Colon polyps Neg Hx    Crohn's disease Neg Hx    Esophageal cancer Neg Hx    Rectal cancer Neg Hx    Stomach cancer Neg Hx    Ulcerative colitis Neg Hx     SOCIAL HISTORY: Social History   Tobacco Use   Smoking status: Former    Types:  Cigarettes, Cigars   Smokeless tobacco: Never  Substance Use Topics   Alcohol use: Not Currently    Allergies  Allergen Reactions   Latex Itching    Current Outpatient Medications  Medication Sig Dispense Refill   acetaminophen (TYLENOL) 325 MG tablet Take 2 tablets (650 mg total) by mouth every 4 (four) hours as needed for mild pain (or temp > 37.5 C (99.5 F)).     amLODipine (NORVASC) 10 MG tablet Take 1 tablet (10 mg total) by mouth daily. 90 tablet 1   aspirin 81 MG chewable tablet Chew 1 tablet (81 mg total) by mouth daily. 30 tablet 0   carvedilol (COREG) 25 MG tablet Take 1 tablet (25 mg total) by mouth 2 (two) times daily with a meal. 180 tablet 1   cloNIDine (CATAPRES) 0.1 MG tablet Take 0.1 mg by mouth at bedtime.     furosemide (LASIX) 80 MG tablet Take 1 tablet (80 mg total) by mouth daily. 90 tablet 0   glipiZIDE (GLUCOTROL) 5 MG tablet Take 1 tablet (5 mg total) by mouth 2 (  two) times daily before a meal. 180 tablet 1   hydrALAZINE (APRESOLINE) 100 MG tablet Take 1 tablet (100 mg total) by mouth 3 (three) times daily. 270 tablet 1   isosorbide mononitrate (IMDUR) 30 MG 24 hr tablet Take 1 tablet by mouth daily.     omeprazole (PRILOSEC) 40 MG capsule Take 1 capsule (40 mg total) by mouth daily. 90 capsule 1   polyethylene glycol powder (GLYCOLAX/MIRALAX) 17 GM/SCOOP powder Take 17 g by mouth daily. 3350 g 1   rosuvastatin (CRESTOR) 40 MG tablet Take 1 tablet (40 mg total) by mouth daily. 90 tablet 1   sodium bicarbonate 650 MG tablet Take by mouth 2 (two) times daily. TAKE 2 TABLETS 2 X A DAY     No current facility-administered medications for this visit.    REVIEW OF SYSTEMS:  [X]  denotes positive finding, [ ]  denotes negative finding Cardiac  Comments:  Chest pain or chest pressure:    Shortness of breath upon exertion:    Short of breath when lying flat:    Irregular heart rhythm:        Vascular    Pain in calf, thigh, or hip brought on by ambulation:     Pain in feet at night that wakes you up from your sleep:     Blood clot in your veins:    Leg swelling:         Pulmonary    Oxygen at home:    Productive cough:     Wheezing:         Neurologic    Sudden weakness in arms or legs:     Sudden numbness in arms or legs:     Sudden onset of difficulty speaking or slurred speech:    Temporary loss of vision in one eye:     Problems with dizziness:         Gastrointestinal    Blood in stool:     Vomited blood:         Genitourinary    Burning when urinating:     Blood in urine:        Psychiatric    Major depression:         Hematologic    Bleeding problems:    Problems with blood clotting too easily:        Skin    Rashes or ulcers:        Constitutional    Fever or chills:     PHYSICAL EXAM:   Vitals:   04/25/23 1026  BP: (!) 150/79  Pulse: 73  Resp: 20  Temp: 97.9 F (36.6 C)  SpO2: 94%  Weight: 160 lb (72.6 kg)  Height: 5\' 5"  (1.651 m)    GENERAL: The patient is a well-nourished male, in no acute distress. The vital signs are documented above. CARDIAC: There is a regular rate and rhythm.  VASCULAR: He has a palpable right radial pulse. PULMONARY: There is good air exchange bilaterally without wheezing or rales. ABDOMEN: Soft and non-tender with normal pitched bowel sounds.  MUSCULOSKELETAL: There are no major deformities or cyanosis. NEUROLOGIC: He has a contracture in his left arm. SKIN: There are no ulcers or rashes noted. PSYCHIATRIC: The patient has a normal affect.  DATA:    No new data   [name] [facility] [phone]

## 2023-04-29 ENCOUNTER — Other Ambulatory Visit: Payer: Self-pay

## 2023-04-29 ENCOUNTER — Encounter (HOSPITAL_COMMUNITY): Payer: Self-pay | Admitting: Vascular Surgery

## 2023-04-29 NOTE — Progress Notes (Signed)
SDW CALL  Patient was given pre-op instructions over the phone. The opportunity was given for the patient to ask questions. No further questions asked. Patient verbalized understanding of instructions given.   PCP - Molli Hazard Cardiologist - denies Nephrologist - Barth Kirks  PPM/ICD - denies Device Orders -  Rep Notified -   Chest x-ray - na EKG - 08/30/22 CE- requested Stress Test - 10/05/22 CE ECHO - 10/05/22  Cardiac Cath - denies  Sleep Study - denies CPAP -   Fasting Blood Sugar - pt has a meter but does not regularly check his blood sugar. Only checks it when he "feels bad"; says it is low at these times but not less than 70.  Checks Blood Sugar _____ times a day  Blood Thinner Instructions:na Aspirin Instructions:continue. Pt takes his aspirin at night.  ERAS Protcol -no PRE-SURGERY Ensure or G2-   COVID TEST- na   Anesthesia review: no  Patient denies shortness of breath, fever, cough and chest pain over the phone call    Surgical Instructions    Your procedure is scheduled on September 24.  Report to [facility] Main Entrance "A" at 1015 A.M., then check in with the Admitting office.  Call this number if you have problems the morning of surgery:  [phone]    Remember:  Do not eat or drink anything after midnight the night before your surgery   Take these medicines the morning of surgery with A SIP OF WATER: Amlodipine,Coreg,Hydralazine,Imdur,Prilosec,Crestor,Sodium Bicarb.  As of today, STOP taking any Aleve, Naproxen, Ibuprofen, Motrin, Advil, Goody's, BC's, all herbal medications, fish oil, and all vitamins.  WHAT DO I DO ABOUT MY DIABETES MEDICATION?  Do not take oral diabetes medicines (pills) the morning of surgery. Do not take glipizide(glucotrol)the morning of surgery.   Check your blood sugar the morning of your surgery when you wake up and every 2 hours until you get to the Short Stay unit.  If your blood sugar is less  than 70 mg/dL, you will need to treat for low blood sugar: Do not take insulin. Treat a low blood sugar (less than 70 mg/dL) with  cup of clear juice (cranberry or apple), 4 glucose tablets, OR glucose gel. Recheck blood sugar in 15 minutes after treatment (to make sure it is greater than 70 mg/dL). If your blood sugar is not greater than 70 mg/dL on recheck, call [phone] for further instructions. Report your blood sugar to the short stay nurse when you get to Short Stay.  [facility] is not responsible for any belongings or valuables. .   Do NOT Smoke (Tobacco/Vaping)  24 hours prior to your procedure  If you use a CPAP at night, you may bring your mask for your overnight stay.   Contacts, glasses, hearing aids, dentures or partials may not be worn into surgery, please bring cases for these belongings   Patients discharged the day of surgery will not be allowed to drive home, and someone needs to stay with them for 24 hours.   SURGICAL WAITING ROOM VISITATION You may have 1 visitor in the pre-op area at a time determined by the pre-op nurse. (Visitor may not switch out) Patients having surgery or a procedure in a hospital may have two support people in the waiting room. Children under the age of 40 must have an adult with them who is not the patient. They may stay in the waiting area during the procedure and may switch out with other visitors. If  the patient needs to stay at the hospital during part of their recovery, the visitor guidelines for inpatient rooms apply.  Please refer to the [facility] website for the visitor guidelines for Inpatients (after your surgery is over and you are in a regular room).     Special instructions:    Oral Hygiene is also important to reduce your risk of infection.  Remember - BRUSH YOUR TEETH THE MORNING OF SURGERY WITH YOUR REGULAR TOOTHPASTE   Day of Surgery:  Take a shower the day of or night before with antibacterial soap. Wear  Clean/Comfortable clothing the morning of surgery Do not apply any deodorants/lotions.   Do not wear jewelry or makeup Do not wear lotions, powders, perfumes/colognes, or deodorant. Do not shave 48 hours prior to surgery.  Men may shave face and neck. Do not bring valuables to the hospital. Do not wear nail polish, gel polish, artificial nails, or any other type of covering on natural nails (fingers and toes) If you have artificial nails or gel coating that need to be removed by a nail salon, please have this removed prior to surgery. Artificial nails or gel coating may interfere with anesthesia's ability to adequately monitor your vital signs. Remember to brush your teeth WITH YOUR REGULAR TOOTHPASTE.

## 2023-04-30 ENCOUNTER — Other Ambulatory Visit: Payer: Self-pay

## 2023-04-30 ENCOUNTER — Ambulatory Visit (HOSPITAL_COMMUNITY)
Admission: RE | Admit: 2023-04-30 | Discharge: 2023-04-30 | Disposition: A | Discharge status: Home / Self Care | Payer: 59 | Attending: Vascular Surgery | Admitting: Vascular Surgery

## 2023-04-30 ENCOUNTER — Encounter (HOSPITAL_COMMUNITY): Payer: Self-pay | Admitting: Vascular Surgery

## 2023-04-30 ENCOUNTER — Encounter (HOSPITAL_COMMUNITY)
Admission: RE | Disposition: A | Discharge status: Home / Self Care | Payer: Self-pay | Source: Home / Self Care | Attending: Vascular Surgery

## 2023-04-30 ENCOUNTER — Ambulatory Visit (HOSPITAL_COMMUNITY): Payer: 59 | Admitting: Anesthesiology

## 2023-04-30 DIAGNOSIS — E1122 Type 2 diabetes mellitus with diabetic chronic kidney disease: Secondary | ICD-10-CM

## 2023-04-30 DIAGNOSIS — I120 Hypertensive chronic kidney disease with stage 5 chronic kidney disease or end stage renal disease: Secondary | ICD-10-CM | POA: Diagnosis not present

## 2023-04-30 DIAGNOSIS — N186 End stage renal disease: Secondary | ICD-10-CM | POA: Diagnosis not present

## 2023-04-30 DIAGNOSIS — Z87891 Personal history of nicotine dependence: Secondary | ICD-10-CM

## 2023-04-30 DIAGNOSIS — N185 Chronic kidney disease, stage 5: Secondary | ICD-10-CM | POA: Insufficient documentation

## 2023-04-30 HISTORY — PX: AV FISTULA PLACEMENT: SHX1204

## 2023-04-30 LAB — GLUCOSE, CAPILLARY
Glucose-Capillary: 108 mg/dL — ABNORMAL HIGH (ref 70–99)
Glucose-Capillary: 125 mg/dL — ABNORMAL HIGH (ref 70–99)
Glucose-Capillary: 109 mg/dL — ABNORMAL HIGH (ref 70–99)
Glucose-Capillary: 114 mg/dL — ABNORMAL HIGH (ref 70–99)

## 2023-04-30 LAB — POCT I-STAT, CHEM 8
BUN: 65 mg/dL — ABNORMAL HIGH (ref 6–20)
Calcium, Ion: 1.25 mmol/L (ref 1.15–1.40)
Chloride: 108 mmol/L (ref 98–111)
Creatinine, Ser: 7.8 mg/dL — ABNORMAL HIGH (ref 0.61–1.24)
Glucose, Bld: 126 mg/dL — ABNORMAL HIGH (ref 70–99)
HCT: 35 % — ABNORMAL LOW (ref 39.0–52.0)
Hemoglobin: 11.9 g/dL — ABNORMAL LOW (ref 13.0–17.0)
Potassium: 3.8 mmol/L (ref 3.5–5.1)
Sodium: 142 mmol/L (ref 135–145)
TCO2: 22 mmol/L (ref 22–32)

## 2023-04-30 LAB — NO BLOOD PRODUCTS

## 2023-04-30 SURGERY — ARTERIOVENOUS (AV) FISTULA CREATION
Anesthesia: General | Site: Arm Upper | Laterality: Right

## 2023-04-30 MED ORDER — HEPARIN 6000 UNIT IRRIGATION SOLUTION
Status: DC | PRN
  Administered 2023-04-30: 1

## 2023-04-30 MED ORDER — HEMOSTATIC AGENTS (NO CHARGE) OPTIME
TOPICAL | Status: DC | PRN
Start: 2023-04-30 — End: 2023-04-30
  Administered 2023-04-30: 1 via TOPICAL

## 2023-04-30 MED ORDER — LIDOCAINE 2% (20 MG/ML) 5 ML SYRINGE
INTRAMUSCULAR | Status: DC | PRN
  Administered 2023-04-30: 100 mg via INTRAVENOUS

## 2023-04-30 MED ORDER — 0.9 % SODIUM CHLORIDE (POUR BTL) OPTIME
TOPICAL | Status: DC | PRN
  Administered 2023-04-30: 1000 mL

## 2023-04-30 MED ORDER — FENTANYL CITRATE (PF) 250 MCG/5ML IJ SOLN
INTRAMUSCULAR | Status: AC
  Filled 2023-04-30: qty 5

## 2023-04-30 MED ORDER — LIDOCAINE 2% (20 MG/ML) 5 ML SYRINGE
INTRAMUSCULAR | Status: AC
  Filled 2023-04-30: qty 5

## 2023-04-30 MED ORDER — MIDAZOLAM HCL 2 MG/2ML IJ SOLN
INTRAMUSCULAR | Status: AC
  Filled 2023-04-30: qty 2

## 2023-04-30 MED ORDER — SODIUM CHLORIDE 0.9 % IV SOLN: INTRAVENOUS | Status: DC

## 2023-04-30 MED ORDER — PROTAMINE SULFATE 10 MG/ML IV SOLN
INTRAVENOUS | Status: DC | PRN
Start: 2023-04-30 — End: 2023-04-30
  Administered 2023-04-30: 40 mg via INTRAVENOUS

## 2023-04-30 MED ORDER — ONDANSETRON HCL 4 MG/2ML IJ SOLN
INTRAMUSCULAR | Status: AC
  Filled 2023-04-30: qty 2

## 2023-04-30 MED ORDER — OXYCODONE HCL 5 MG PO TABS: 5.0000 mg | ORAL_TABLET | Freq: Four times a day (QID) | ORAL | 0 refills | Status: AC | PRN

## 2023-04-30 MED ORDER — EPHEDRINE SULFATE-NACL 50-0.9 MG/10ML-% IV SOSY
PREFILLED_SYRINGE | INTRAVENOUS | Status: DC | PRN
  Administered 2023-04-30: 5 mg via INTRAVENOUS
  Administered 2023-04-30: 10 mg via INTRAVENOUS

## 2023-04-30 MED ORDER — ORAL CARE MOUTH RINSE: 15.0000 mL | Freq: Once | OROMUCOSAL | Status: AC

## 2023-04-30 MED ORDER — LIDOCAINE-EPINEPHRINE (PF) 1 %-1:200000 IJ SOLN
INTRAMUSCULAR | Status: DC | PRN
  Administered 2023-04-30: 24 mL

## 2023-04-30 MED ORDER — SODIUM CHLORIDE 0.9 % IV SOLN: INTRAVENOUS | Status: DC | PRN

## 2023-04-30 MED ORDER — SUCCINYLCHOLINE CHLORIDE 200 MG/10ML IV SOSY
PREFILLED_SYRINGE | INTRAVENOUS | Status: AC
  Filled 2023-04-30: qty 10

## 2023-04-30 MED ORDER — CHLORHEXIDINE GLUCONATE 0.12 % MT SOLN
OROMUCOSAL | Status: AC
  Administered 2023-04-30: 15 mL via OROMUCOSAL
  Filled 2023-04-30: qty 15

## 2023-04-30 MED ORDER — ONDANSETRON HCL 4 MG/2ML IJ SOLN
INTRAMUSCULAR | Status: DC | PRN
  Administered 2023-04-30: 4 mg via INTRAVENOUS

## 2023-04-30 MED ORDER — CHLORHEXIDINE GLUCONATE 4 % EX SOLN: 60.0000 mL | Freq: Once | CUTANEOUS | Status: DC

## 2023-04-30 MED ORDER — DEXAMETHASONE SODIUM PHOSPHATE 10 MG/ML IJ SOLN
INTRAMUSCULAR | Status: AC
  Filled 2023-04-30: qty 1

## 2023-04-30 MED ORDER — LIDOCAINE HCL (PF) 1 % IJ SOLN
INTRAMUSCULAR | Status: AC
  Filled 2023-04-30: qty 30

## 2023-04-30 MED ORDER — CHLORHEXIDINE GLUCONATE 0.12 % MT SOLN: 15.0000 mL | Freq: Once | OROMUCOSAL | Status: AC

## 2023-04-30 MED ORDER — PROPOFOL 10 MG/ML IV BOLUS
INTRAVENOUS | Status: DC | PRN
  Administered 2023-04-30: 20 mg via INTRAVENOUS
  Administered 2023-04-30: 120 mg via INTRAVENOUS
  Administered 2023-04-30: 20 mg via INTRAVENOUS

## 2023-04-30 MED ORDER — FENTANYL CITRATE (PF) 100 MCG/2ML IJ SOLN
INTRAMUSCULAR | Status: AC
  Filled 2023-04-30: qty 2

## 2023-04-30 MED ORDER — GLYCOPYRROLATE 0.2 MG/ML IJ SOLN
INTRAMUSCULAR | Status: DC | PRN
Start: 2023-04-30 — End: 2023-04-30
  Administered 2023-04-30: .2 mg via INTRAVENOUS

## 2023-04-30 MED ORDER — DEXAMETHASONE SODIUM PHOSPHATE 10 MG/ML IJ SOLN
INTRAMUSCULAR | Status: DC | PRN
  Administered 2023-04-30: 4 mg via INTRAVENOUS

## 2023-04-30 MED ORDER — HEPARIN 6000 UNIT IRRIGATION SOLUTION
Status: AC
  Filled 2023-04-30: qty 500

## 2023-04-30 MED ORDER — FENTANYL CITRATE (PF) 250 MCG/5ML IJ SOLN
INTRAMUSCULAR | Status: DC | PRN
  Administered 2023-04-30 (×2): 50 ug via INTRAVENOUS

## 2023-04-30 MED ORDER — HEPARIN SODIUM (PORCINE) 1000 UNIT/ML IJ SOLN
INTRAMUSCULAR | Status: DC | PRN
  Administered 2023-04-30: 7000 [IU] via INTRAVENOUS

## 2023-04-30 MED ORDER — GLYCOPYRROLATE PF 0.2 MG/ML IJ SOSY
PREFILLED_SYRINGE | INTRAMUSCULAR | Status: AC
  Filled 2023-04-30: qty 1

## 2023-04-30 MED ORDER — CEFAZOLIN SODIUM-DEXTROSE 2-4 GM/100ML-% IV SOLN
2.0000 g | INTRAVENOUS | Status: AC
  Administered 2023-04-30: 2 g via INTRAVENOUS
  Filled 2023-04-30: qty 100

## 2023-04-30 MED ORDER — HEPARIN SODIUM (PORCINE) 1000 UNIT/ML IJ SOLN
INTRAMUSCULAR | Status: AC
  Filled 2023-04-30: qty 10

## 2023-04-30 MED ORDER — MIDAZOLAM HCL 2 MG/2ML IJ SOLN
INTRAMUSCULAR | Status: DC | PRN
  Administered 2023-04-30: 2 mg via INTRAVENOUS

## 2023-04-30 MED ORDER — INSULIN ASPART 100 UNIT/ML IJ SOLN: 0.0000 [IU] | INTRAMUSCULAR | Status: DC | PRN

## 2023-04-30 MED ORDER — PROPOFOL 10 MG/ML IV BOLUS
INTRAVENOUS | Status: AC
  Filled 2023-04-30: qty 20

## 2023-04-30 MED ORDER — ROCURONIUM BROMIDE 10 MG/ML (PF) SYRINGE
PREFILLED_SYRINGE | INTRAVENOUS | Status: AC
  Filled 2023-04-30: qty 10

## 2023-04-30 MED ORDER — PROTAMINE SULFATE 10 MG/ML IV SOLN
INTRAVENOUS | Status: AC
  Filled 2023-04-30: qty 5

## 2023-04-30 MED ORDER — LIDOCAINE-EPINEPHRINE (PF) 1 %-1:200000 IJ SOLN
INTRAMUSCULAR | Status: AC
  Filled 2023-04-30: qty 30

## 2023-04-30 MED ORDER — EPHEDRINE 5 MG/ML INJ
INTRAVENOUS | Status: AC
  Filled 2023-04-30: qty 5

## 2023-04-30 SURGICAL SUPPLY — 37 items
ADH SKN CLS APL DERMABOND .7 (GAUZE/BANDAGES/DRESSINGS) ×1
AGENT HMST 10 BLLW SHRT CANN (HEMOSTASIS) ×1
ARMBAND PINK RESTRICT EXTREMIT (MISCELLANEOUS) ×2 IMPLANT
BAG COUNTER SPONGE SURGICOUNT (BAG) ×1 IMPLANT
BAG SPNG CNTER NS LX DISP (BAG) ×1
CANISTER SUCT 3000ML PPV (MISCELLANEOUS) ×1 IMPLANT
CANNULA VESSEL 3MM 2 BLNT TIP (CANNULA) ×1 IMPLANT
CLIP TI MEDIUM 6 (CLIP) ×1 IMPLANT
CLIP TI WIDE RED SMALL 6 (CLIP) ×1 IMPLANT
COVER PROBE W GEL 5X96 (DRAPES) IMPLANT
DERMABOND ADVANCED .7 DNX12 (GAUZE/BANDAGES/DRESSINGS) ×1 IMPLANT
ELECT REM PT RETURN 9FT ADLT (ELECTROSURGICAL) ×1
ELECTRODE REM PT RTRN 9FT ADLT (ELECTROSURGICAL) ×1 IMPLANT
GLOVE BIO SURGEON STRL SZ7.5 (GLOVE) ×1 IMPLANT
GLOVE BIOGEL PI IND STRL 8 (GLOVE) ×1 IMPLANT
GLOVE SURG POLY ORTHO LF SZ7.5 (GLOVE) IMPLANT
GLOVE SURG UNDER LTX SZ8 (GLOVE) ×1 IMPLANT
GOWN STRL REUS W/ TWL LRG LVL3 (GOWN DISPOSABLE) ×3 IMPLANT
GOWN STRL REUS W/TWL LRG LVL3 (GOWN DISPOSABLE) ×3
GRAFT GORETEX STRT 4-7X45 (Vascular Products) IMPLANT
HEMOSTAT HEMOBLAST BELLOWS (HEMOSTASIS) IMPLANT
KIT BASIN OR (CUSTOM PROCEDURE TRAY) ×1 IMPLANT
KIT TURNOVER KIT B (KITS) ×1 IMPLANT
NS IRRIG 1000ML POUR BTL (IV SOLUTION) ×1 IMPLANT
PACK CV ACCESS (CUSTOM PROCEDURE TRAY) ×1 IMPLANT
PAD ARMBOARD 7.5X6 YLW CONV (MISCELLANEOUS) ×2 IMPLANT
SLING ARM FOAM STRAP LRG (SOFTGOODS) IMPLANT
SLING ARM FOAM STRAP MED (SOFTGOODS) IMPLANT
SPIKE FLUID TRANSFER (MISCELLANEOUS) ×1 IMPLANT
SPONGE SURGIFOAM ABS GEL 100 (HEMOSTASIS) IMPLANT
SUT MNCRL AB 4-0 PS2 18 (SUTURE) ×1 IMPLANT
SUT PROLENE 6 0 BV (SUTURE) ×1 IMPLANT
SUT VIC AB 3-0 SH 27 (SUTURE) ×2
SUT VIC AB 3-0 SH 27X BRD (SUTURE) ×1 IMPLANT
TOWEL GREEN STERILE (TOWEL DISPOSABLE) ×1 IMPLANT
UNDERPAD 30X36 HEAVY ABSORB (UNDERPADS AND DIAPERS) ×1 IMPLANT
WATER STERILE IRR 1000ML POUR (IV SOLUTION) ×1 IMPLANT

## 2023-04-30 NOTE — Discharge Instructions (Signed)

## 2023-04-30 NOTE — Transfer of Care (Signed)
Immediate Anesthesia Transfer of Care Note  Patient: [name]  Procedure(s) Performed: INSERTION OF RIGHT UPPER ARM GORETEX  GRAFT (Right: Arm Upper)  Patient Location: PACU  Anesthesia Type:General  Level of Consciousness: awake, alert , oriented, and sedated  Airway & Oxygen Therapy: Patient Spontanous Breathing and Patient connected to face mask oxygen  Post-op Assessment: Report given to RN and Post -op Vital signs reviewed and stable  Post vital signs: Reviewed and stable  Last Vitals:  Vitals Value Taken Time  BP 151/91 04/30/23 1715  Temp 97.4   Pulse 83 04/30/23 1717  Resp 13 04/30/23 1717  SpO2 100 % 04/30/23 1717  Vitals shown include unfiled device data.  Last Pain:  Vitals:   04/30/23 1141  TempSrc:   PainSc: 0-No pain         Complications: No notable events documented.

## 2023-04-30 NOTE — Progress Notes (Signed)
During safety handoff in pre-op with Sorin CRNA, patient stated he refused all blood products except albumin. Blood refusal signed, copy sent to blood bank. OR nurse made aware, asked OR nurse to notify Dr. Edilia Bo in OR.  Mother of pt at bedside, aware of pt's blood refusal.

## 2023-04-30 NOTE — Anesthesia Preprocedure Evaluation (Signed)
Anesthesia Evaluation  Patient identified by MRN, date of birth, ID band Patient awake    Reviewed: Allergy & Precautions, H&P , NPO status , Patient's Chart, lab work & pertinent test results  Airway Mallampati: II   Neck ROM: full    Dental   Pulmonary sleep apnea , former smoker   breath sounds clear to auscultation       Cardiovascular hypertension,  Rhythm:regular Rate:Normal     Neuro/Psych CVA    GI/Hepatic ,GERD  ,,  Endo/Other  diabetes, Type 2    Renal/GU ESRFRenal disease     Musculoskeletal   Abdominal   Peds  Hematology   Anesthesia Other Findings   Reproductive/Obstetrics                             Anesthesia Physical Anesthesia Plan  ASA: 3  Anesthesia Plan: General   Post-op Pain Management:    Induction: Intravenous  PONV Risk Score and Plan: 2 and Ondansetron, Dexamethasone, Treatment may vary due to age or medical condition and Midazolam  Airway Management Planned: LMA  Additional Equipment:   Intra-op Plan:   Post-operative Plan: Extubation in OR  Informed Consent: I have reviewed the patients History and Physical, chart, labs and discussed the procedure including the risks, benefits and alternatives for the proposed anesthesia with the patient or authorized representative who has indicated his/her understanding and acceptance.     Dental advisory given  Plan Discussed with: CRNA, Anesthesiologist and Surgeon  Anesthesia Plan Comments:        Anesthesia Quick Evaluation

## 2023-04-30 NOTE — Interval H&P Note (Signed)
History and Physical Interval Note:  04/30/2023 2:37 PM  Tony Schmitt  has presented today for surgery, with the diagnosis of Chronic kidney disease, stage V.  The various methods of treatment have been discussed with the patient and family. After consideration of risks, benefits and other options for treatment, the patient has consented to  Procedure(s): RIGHT ARM ARTERIOVENOUS (AV) FISTULA VERSUS ARTERIOVENOUS GRAFT CREATION (Right) as a surgical intervention.  The patient's history has been reviewed, patient examined, no change in status, stable for surgery.  I have reviewed the patient's chart and labs.  Questions were answered to the patient's satisfaction.     [name]

## 2023-04-30 NOTE — Op Note (Signed)
NAME: Tony Schmitt    MRN: [id number] DOB: [birth year]    DATE OF OPERATION: 04/30/2023  PREOP DIAGNOSIS:    Stage V chronic kidney disease  POSTOP DIAGNOSIS:    Same  PROCEDURE:    Right upper arm AV graft (4-7 mm PTFE graft)  SURGEON: Di Kindle. Edilia Bo, MD  ASSIST: Aggie Moats, PA  ANESTHESIA: General  EBL: Minimal  INDICATIONS:    [name] is a 52 y.o. male who presents for new access.  Based on his vein map he was not a candidate for a fistula.  FINDINGS:   The antecubital veins were small.  I elected to place an upper arm graft.  The brachial artery was 4 mm.  The axillary vein was 4-1/2 mm.  Excellent thrill in the graft.  Radial and ulnar signal with the Doppler at the completion.  TECHNIQUE:   The patient was taken to the operating room and received a general anesthetic.  The right arm was prepped and draped in usual sterile fashion.  Longitudinal incision was made just above the antecubital level.  Here the brachial vein was dissected free but it was felt to be small.  I tried to irrigated up with heparinized saline but I did not think the vein was adequate.  The brachial artery was dissected free beneath the fascia.  Separate incision was made beneath the axilla and the high brachial vein was dissected free.  It was a 4-1/2 mm vein.  A tunnel was created between the 2 incisions and the patient was heparinized.  A 4-7 mm PTFE graft was tunneled between the 2 incisions.  The brachial artery was clamped proximally and distally and a longitudinal arteriotomy was made.  A segment of the 4 mm end of the graft was excised, the graft slightly spatulated and sewn end-to-side to the artery using continuous 6-0 Prolene suture.  The graft then pulled the appropriate length for anastomosis to the high brachial vein.  The vein was ligated distally and spatulated proximally.  The graft was cut to the appropriate length, spatulated and sewn into into the vein using  continuous 6-0 Prolene suture.  At the completion there was an excellent thrill in the fistula.  There was a weakly palpable radial pulse and a good radial and ulnar signal with a Doppler.  The heparin was partially reversed with protamine.  Each of the wounds was closed with a deep layer of 3-0 Vicryl and the skin closed with 4-0 Monocryl.  Dermabond was applied.  The patient tolerated the procedure well and was transferred to the recovery room in stable condition.  All needle and sponge counts were correct.  Given the complexity of the case,  the assistant was necessary in order to expedient the procedure and safely perform the technical aspects of the operation.  The assistant provided traction and countertraction to assist with exposure of the artery and vein.  They also assisted with suture ligation of multiple venous branches.  They played a critical role in the anastomosis. These skills, especially following the Prolene suture for the anastomosis, could not have been adequately performed by a scrub tech assistant.    [name], MD, FACS [facility]  DATE OF DICTATION:   04/30/2023

## 2023-04-30 NOTE — Progress Notes (Signed)
Arnes, Granum (Mother) [phone] [facility] Phone)

## 2023-05-01 ENCOUNTER — Encounter (HOSPITAL_COMMUNITY): Payer: Self-pay | Admitting: Vascular Surgery

## 2023-05-01 NOTE — Anesthesia Postprocedure Evaluation (Signed)
Anesthesia Post Note  Patient: Tony Schmitt  Procedure(s) Performed: INSERTION OF RIGHT UPPER ARM GORETEX  GRAFT (Right: Arm Upper)     Patient location during evaluation: PACU Anesthesia Type: General Level of consciousness: awake and alert Pain management: pain level controlled Vital Signs Assessment: post-procedure vital signs reviewed and stable Respiratory status: spontaneous breathing, nonlabored ventilation, respiratory function stable and patient connected to nasal cannula oxygen Cardiovascular status: blood pressure returned to baseline and stable Postop Assessment: no apparent nausea or vomiting Anesthetic complications: no   No notable events documented.  Last Vitals:  Vitals:   04/30/23 1730 04/30/23 1745  BP: (!) 174/93 (!) 164/89  Pulse: 84 83  Resp: 12 12  Temp:  36.6 C  SpO2: 96% 97%    Last Pain:  Vitals:   04/30/23 1715  TempSrc:   PainSc: 0-No pain                 Tiffanie Blassingame S

## 2023-05-05 ENCOUNTER — Other Ambulatory Visit: Payer: Self-pay | Admitting: Family Medicine

## 2023-05-06 ENCOUNTER — Other Ambulatory Visit: Payer: Self-pay | Admitting: Family Medicine

## 2023-05-06 DIAGNOSIS — I129 Hypertensive chronic kidney disease with stage 1 through stage 4 chronic kidney disease, or unspecified chronic kidney disease: Secondary | ICD-10-CM

## 2023-05-06 NOTE — Telephone Encounter (Signed)
No longer current dosing of this medication Requested Prescriptions  Pending Prescriptions Disp Refills   rosuvastatin (CRESTOR) 20 MG tablet [Pharmacy Med Name: Rosuvastatin Calcium 20 MG Oral Tablet] 90 tablet 0    Sig: Take 1 tablet by mouth once daily     Cardiovascular:  Antilipid - Statins 2 Failed - 05/05/2023 12:30 PM      Failed - Cr in normal range and within 360 days    Creatinine, Ser  Date Value Ref Range Status  04/30/2023 7.80 (H) 0.61 - 1.24 mg/dL Final   Creatinine, Urine  Date Value Ref Range Status  06/11/2020 87.72 mg/dL Final    Comment:    Performed at [facility], [address] [place], [place] [address]         Failed - Lipid Panel in normal range within the last 12 months    Cholesterol, Total  Date Value Ref Range Status  01/11/2023 212 (H) 100 - 199 mg/dL Final   LDL Chol Calc (NIH)  Date Value Ref Range Status  01/11/2023 130 (H) 0 - 99 mg/dL Final   HDL  Date Value Ref Range Status  01/11/2023 61 >39 mg/dL Final   Triglycerides  Date Value Ref Range Status  01/11/2023 116 0 - 149 mg/dL Final         Passed - Patient is not pregnant      Passed - Valid encounter within last 12 months    Recent Outpatient Visits           3 months ago Type 2 diabetes mellitus with other specified complication, without long-term current use of insulin (HCC)   [place] [facility] [place], [place], MD   9 months ago Type 2 diabetes mellitus with other specified complication, without long-term current use of insulin (HCC)   [place] [facility] [place], [place], MD   1 year ago Type 2 diabetes mellitus with other specified complication, without long-term current use of insulin (HCC)   [place] [facility] [place], [place], MD   1 year ago Hypertension in stage 4 chronic kidney disease due to type 2 diabetes mellitus [facility])   [place] [facility] [place], [place] L, RPH-CPP   1 year ago Hypertension in stage 4 chronic kidney disease due to type 2 diabetes mellitus [facility])   [facility] [place], [place]elius [place], RPH-CPP

## 2023-05-06 NOTE — Telephone Encounter (Signed)
Medication Refill - Medication: hydrALAZINE (APRESOLINE) 100 MG tablet   Has the patient contacted their pharmacy? Yes, pt wnet thru automated,  I let her know to speak to pharmacist directly in the future   Preferred Pharmacy (with phone number or street name): [facility] [address] [place]), [place] - [id number] Phone: [phone]  Fax: [phone]   Has the patient been seen for an appointment in the last year OR does the patient have an upcoming appointment? yes  Agent: Please be advised that RX refills may take up to 3 business days. We ask that you follow-up with your pharmacy.

## 2023-05-07 NOTE — Telephone Encounter (Signed)
Requested Prescriptions  Refused Prescriptions Disp Refills   hydrALAZINE (APRESOLINE) 100 MG tablet 270 tablet 1    Sig: Take 1 tablet (100 mg total) by mouth 3 (three) times daily.     Cardiovascular:  Vasodilators Failed - 05/06/2023 11:05 AM      Failed - HCT in normal range and within 360 days    HCT  Date Value Ref Range Status  04/30/2023 35.0 (L) 39.0 - 52.0 % Final   Hematocrit  Date Value Ref Range Status  06/07/2020 30.3 (L) 37.5 - 51.0 % Final         Failed - HGB in normal range and within 360 days    Hemoglobin  Date Value Ref Range Status  04/30/2023 11.9 (L) 13.0 - 17.0 g/dL Final  62/95/2841 9.8 (L) 13.0 - 17.7 g/dL Final         Failed - RBC in normal range and within 360 days    RBC  Date Value Ref Range Status  04/12/2023 4.18 (L) 4.22 - 5.81 MIL/uL Final         Failed - ANA Screen, Ifa, Serum in normal range and within 360 days    Anti Nuclear Antibody (ANA)  Date Value Ref Range Status  04/17/2020 Negative Negative Final    Comment:    (NOTE) Performed At: [facility] [address] [place], [place] [id number] Jolene Schimke MD OZ:[id number]          Failed - Last BP in normal range    BP Readings from Last 1 Encounters:  04/30/23 (!) 164/89         Passed - WBC in normal range and within 360 days    WBC  Date Value Ref Range Status  04/12/2023 8.6 4.0 - 10.5 K/uL Final         Passed - PLT in normal range and within 360 days    Platelets  Date Value Ref Range Status  04/12/2023 279 150 - 400 K/uL Final  06/07/2020 471 (H) 150 - 450 x10E3/uL Final   Platelet Count, POC  Date Value Ref Range Status  04/16/2019 424 142 - 424 K/uL Final         Passed - Valid encounter within last 12 months    Recent Outpatient Visits           4 months ago Type 2 diabetes mellitus with other specified complication, without long-term current use of insulin (HCC)   [facility] [place], [place], MD   9  months ago Type 2 diabetes mellitus with other specified complication, without long-term current use of insulin (HCC)   [facility] [place], [place], MD   1 year ago Type 2 diabetes mellitus with other specified complication, without long-term current use of insulin (HCC)   [facility] [place], [place], MD   1 year ago Hypertension in stage 4 chronic kidney disease due to type 2 diabetes mellitus [facility])    [facility] [place], [place] L, RPH-CPP   1 year ago Hypertension in stage 4 chronic kidney disease due to type 2 diabetes mellitus [facility])   [facility] [place], Cornelius Moras, RPH-CPP

## 2023-05-07 NOTE — Telephone Encounter (Signed)
Called [facility] - rx hsa been ready for 3 days. Called pt - spoke with Mother. She will p/u meds today.

## 2023-05-09 ENCOUNTER — Other Ambulatory Visit: Payer: Self-pay | Admitting: Family Medicine

## 2023-05-09 NOTE — Telephone Encounter (Signed)
Medication Refill - Medication:   Generic Crestor  40 mg   Has the patient contacted their pharmacy? Yes.   (Agent: If no, request that the patient contact the pharmacy for the refill. If patient does not wish to contact the pharmacy document the reason why and proceed with request.) (Agent: If yes, when and what did the pharmacy advise?)  Preferred Pharmacy (with phone number or street name): [facility] [place]  Has the patient been seen for an appointment in the last year OR does the patient have an upcoming appointment? Yes.    Agent: Please be advised that RX refills may take up to 3 business days. We ask that you follow-up with your pharmacy.

## 2023-05-09 NOTE — Telephone Encounter (Signed)
Unable to refill per protocol, Rx expired. Discontinued 02/09/22 dose change from 20 mg to 40 mg.  Requested Prescriptions  Pending Prescriptions Disp Refills   rosuvastatin (CRESTOR) 20 MG tablet [Pharmacy Med Name: Rosuvastatin Calcium 20 MG Oral Tablet] 90 tablet 0    Sig: Take 1 tablet by mouth once daily     Cardiovascular:  Antilipid - Statins 2 Failed - 05/09/2023 10:34 AM      Failed - Cr in normal range and within 360 days    Creatinine, Ser  Date Value Ref Range Status  04/30/2023 7.80 (H) 0.61 - 1.24 mg/dL Final   Creatinine, Urine  Date Value Ref Range Status  06/11/2020 87.72 mg/dL Final    Comment:    Performed at [facility], [address] [place], [place] [address]         Failed - Lipid Panel in normal range within the last 12 months    Cholesterol, Total  Date Value Ref Range Status  01/11/2023 212 (H) 100 - 199 mg/dL Final   LDL Chol Calc (NIH)  Date Value Ref Range Status  01/11/2023 130 (H) 0 - 99 mg/dL Final   HDL  Date Value Ref Range Status  01/11/2023 61 >39 mg/dL Final   Triglycerides  Date Value Ref Range Status  01/11/2023 116 0 - 149 mg/dL Final         Passed - Patient is not pregnant      Passed - Valid encounter within last 12 months    Recent Outpatient Visits           4 months ago Type 2 diabetes mellitus with other specified complication, without long-term current use of insulin (HCC)   [place] [facility] [place], [place], MD   9 months ago Type 2 diabetes mellitus with other specified complication, without long-term current use of insulin (HCC)   [facility] [place], [place], MD   1 year ago Type 2 diabetes mellitus with other specified complication, without long-term current use of insulin (HCC)   [place] [facility] [place], [place], MD   1 year ago Hypertension in stage 4 chronic kidney disease due to type 2 diabetes mellitus [facility])    [place] [facility] [place], [place] L, RPH-CPP   1 year ago Hypertension in stage 4 chronic kidney disease due to type 2 diabetes mellitus [facility])   [facility] [place], Cornelius Moras, RPH-CPP

## 2023-05-09 NOTE — Telephone Encounter (Signed)
Called pharmacy  - was on hold for over 6 minutes.

## 2023-05-09 NOTE — Telephone Encounter (Signed)
Recent creatinine looks to be contraindicative to rosuvastatin at this dose. Forwarding to provider so she is aware.

## 2023-05-09 NOTE — Telephone Encounter (Signed)
Requested medications are due for refill today.  unsure  Requested medications are on the active medications list.  yes  Last refill. 01/15/2023 #90 1 rf  Future visit scheduled.   no  Notes to clinic.   Sig:   Take 1 tablet (40 mg total) by mouth daily.    Patient taking differently:   Take 20 mg by mouth daily.    Route:   Oral    Note to Pharmacy:   Dose increase, discontinue 20 mg Crestor     Pt is taking 20 mg, Rx is for 40. Please advise.    Requested Prescriptions  Pending Prescriptions Disp Refills   rosuvastatin (CRESTOR) 40 MG tablet 90 tablet 1    Sig: Take 1 tablet (40 mg total) by mouth daily.     Cardiovascular:  Antilipid - Statins 2 Failed - 05/09/2023  1:19 PM      Failed - Cr in normal range and within 360 days    Creatinine, Ser  Date Value Ref Range Status  04/30/2023 7.80 (H) 0.61 - 1.24 mg/dL Final   Creatinine, Urine  Date Value Ref Range Status  06/11/2020 87.72 mg/dL Final    Comment:    Performed at [facility], [address] [place], [place] [address]         Failed - Lipid Panel in normal range within the last 12 months    Cholesterol, Total  Date Value Ref Range Status  01/11/2023 212 (H) 100 - 199 mg/dL Final   LDL Chol Calc (NIH)  Date Value Ref Range Status  01/11/2023 130 (H) 0 - 99 mg/dL Final   HDL  Date Value Ref Range Status  01/11/2023 61 >39 mg/dL Final   Triglycerides  Date Value Ref Range Status  01/11/2023 116 0 - 149 mg/dL Final         Passed - Patient is not pregnant      Passed - Valid encounter within last 12 months    Recent Outpatient Visits           4 months ago Type 2 diabetes mellitus with other specified complication, without long-term current use of insulin (HCC)   [place] [place], [place], MD   9 months ago Type 2 diabetes mellitus with other specified complication, without long-term current use of insulin (HCC)   [facility] [place] [facility] [place], [place], MD   1 year ago Type 2 diabetes mellitus with other specified complication, without long-term current use of insulin (HCC)   [facility] [facility] [place], [place], MD   1 year ago Hypertension in stage 4 chronic kidney disease due to type 2 diabetes mellitus [facility])   [facility] [place], [place] L, RPH-CPP   1 year ago Hypertension in stage 4 chronic kidney disease due to type 2 diabetes mellitus [facility])   [facility] [place], Cornelius Moras, RPH-CPP

## 2023-05-10 ENCOUNTER — Ambulatory Visit (HOSPITAL_COMMUNITY)
Admission: RE | Admit: 2023-05-10 | Discharge: 2023-05-10 | Disposition: A | Discharge status: Home / Self Care | Payer: 59 | Source: Ambulatory Visit | Attending: Nephrology | Admitting: Nephrology

## 2023-05-10 VITALS — BP 143/72 | HR 77 | Temp 97.7°F | Resp 17

## 2023-05-10 DIAGNOSIS — N183 Chronic kidney disease, stage 3 unspecified: Secondary | ICD-10-CM | POA: Diagnosis not present

## 2023-05-10 LAB — CBC WITH DIFFERENTIAL/PLATELET
Abs Immature Granulocytes: 0.05 10*3/uL (ref 0.00–0.07)
Basophils Absolute: 0.1 10*3/uL (ref 0.0–0.1)
Basophils Relative: 1 %
Eosinophils Absolute: 0.2 10*3/uL (ref 0.0–0.5)
Eosinophils Relative: 2 %
HCT: 33.2 % — ABNORMAL LOW (ref 39.0–52.0)
Hemoglobin: 10.6 g/dL — ABNORMAL LOW (ref 13.0–17.0)
Immature Granulocytes: 1 %
Lymphocytes Relative: 27 %
Lymphs Abs: 2.5 10*3/uL (ref 0.7–4.0)
MCH: 27.4 pg (ref 26.0–34.0)
MCHC: 31.9 g/dL (ref 30.0–36.0)
MCV: 85.8 fL (ref 80.0–100.0)
Monocytes Absolute: 0.7 10*3/uL (ref 0.1–1.0)
Monocytes Relative: 7 %
Neutro Abs: 5.9 10*3/uL (ref 1.7–7.7)
Neutrophils Relative %: 62 %
Platelets: 308 10*3/uL (ref 150–400)
RBC: 3.87 MIL/uL — ABNORMAL LOW (ref 4.22–5.81)
RDW: 14.5 % (ref 11.5–15.5)
WBC: 9.3 10*3/uL (ref 4.0–10.5)
nRBC: 0 % (ref 0.0–0.2)

## 2023-05-10 LAB — RENAL FUNCTION PANEL
Albumin: 3.4 g/dL — ABNORMAL LOW (ref 3.5–5.0)
Anion gap: 11 (ref 5–15)
BUN: 54 mg/dL — ABNORMAL HIGH (ref 6–20)
CO2: 20 mmol/L — ABNORMAL LOW (ref 22–32)
Calcium: 9.6 mg/dL (ref 8.9–10.3)
Chloride: 105 mmol/L (ref 98–111)
Creatinine, Ser: 5.95 mg/dL — ABNORMAL HIGH (ref 0.61–1.24)
GFR, Estimated: 11 mL/min — ABNORMAL LOW (ref >60)
Glucose, Bld: 247 mg/dL — ABNORMAL HIGH (ref 70–99)
Phosphorus: 4.1 mg/dL (ref 2.5–4.6)
Potassium: 3.8 mmol/L (ref 3.5–5.1)
Sodium: 136 mmol/L (ref 135–145)

## 2023-05-10 LAB — POCT HEMOGLOBIN-HEMACUE: Hemoglobin: 10.6 g/dL — ABNORMAL LOW (ref 13.0–17.0)

## 2023-05-10 MED ORDER — ROSUVASTATIN CALCIUM 40 MG PO TABS: 40.0000 mg | ORAL_TABLET | Freq: Every day | ORAL | 1 refills | Status: DC

## 2023-05-10 MED ORDER — EPOETIN ALFA-EPBX 10000 UNIT/ML IJ SOLN
INTRAMUSCULAR | Status: AC
  Administered 2023-05-10: 20000 [IU] via SUBCUTANEOUS
  Filled 2023-05-10: qty 2

## 2023-05-10 MED ORDER — EPOETIN ALFA-EPBX 10000 UNIT/ML IJ SOLN: 20000.0000 [IU] | INTRAMUSCULAR | Status: DC

## 2023-05-20 ENCOUNTER — Encounter: Payer: 59 | Admitting: Family Medicine

## 2023-06-07 ENCOUNTER — Encounter (HOSPITAL_COMMUNITY)
Admission: RE | Admit: 2023-06-07 | Discharge: 2023-06-07 | Disposition: A | Discharge status: Home / Self Care | Payer: 59 | Source: Ambulatory Visit | Attending: Nephrology | Admitting: Nephrology

## 2023-06-07 VITALS — BP 134/77 | HR 74 | Temp 97.2°F | Resp 16

## 2023-06-07 DIAGNOSIS — N183 Chronic kidney disease, stage 3 unspecified: Secondary | ICD-10-CM | POA: Insufficient documentation

## 2023-06-07 LAB — RENAL FUNCTION PANEL
Albumin: 3.9 g/dL (ref 3.5–5.0)
Anion gap: 13 (ref 5–15)
BUN: 71 mg/dL — ABNORMAL HIGH (ref 6–20)
CO2: 20 mmol/L — ABNORMAL LOW (ref 22–32)
Calcium: 9.8 mg/dL (ref 8.9–10.3)
Chloride: 104 mmol/L (ref 98–111)
Creatinine, Ser: 7.27 mg/dL — ABNORMAL HIGH (ref 0.61–1.24)
GFR, Estimated: 8 mL/min — ABNORMAL LOW (ref >60)
Glucose, Bld: 133 mg/dL — ABNORMAL HIGH (ref 70–99)
Phosphorus: 4.2 mg/dL (ref 2.5–4.6)
Potassium: 3.5 mmol/L (ref 3.5–5.1)
Sodium: 137 mmol/L (ref 135–145)

## 2023-06-07 LAB — CBC WITH DIFFERENTIAL/PLATELET
Abs Immature Granulocytes: 0.03 10*3/uL (ref 0.00–0.07)
Basophils Absolute: 0.1 10*3/uL (ref 0.0–0.1)
Basophils Relative: 1 %
Eosinophils Absolute: 0.2 10*3/uL (ref 0.0–0.5)
Eosinophils Relative: 2 %
HCT: 35.5 % — ABNORMAL LOW (ref 39.0–52.0)
Hemoglobin: 11.1 g/dL — ABNORMAL LOW (ref 13.0–17.0)
Immature Granulocytes: 0 %
Lymphocytes Relative: 29 %
Lymphs Abs: 2.4 10*3/uL (ref 0.7–4.0)
MCH: 26 pg (ref 26.0–34.0)
MCHC: 31.3 g/dL (ref 30.0–36.0)
MCV: 83.1 fL (ref 80.0–100.0)
Monocytes Absolute: 0.4 10*3/uL (ref 0.1–1.0)
Monocytes Relative: 5 %
Neutro Abs: 5 10*3/uL (ref 1.7–7.7)
Neutrophils Relative %: 63 %
Platelets: 268 10*3/uL (ref 150–400)
RBC: 4.27 MIL/uL (ref 4.22–5.81)
RDW: 14.7 % (ref 11.5–15.5)
WBC: 8.1 10*3/uL (ref 4.0–10.5)
nRBC: 0 % (ref 0.0–0.2)

## 2023-06-07 LAB — IRON AND TIBC
Iron: 87 ug/dL (ref 45–182)
Saturation Ratios: 31 % (ref 17.9–39.5)
TIBC: 279 ug/dL (ref 250–450)
UIBC: 192 ug/dL

## 2023-06-07 LAB — FERRITIN: Ferritin: 453 ng/mL — ABNORMAL HIGH (ref 24–336)

## 2023-06-07 LAB — POCT HEMOGLOBIN-HEMACUE: Hemoglobin: 11.2 g/dL — ABNORMAL LOW (ref 13.0–17.0)

## 2023-06-07 MED ORDER — EPOETIN ALFA-EPBX 10000 UNIT/ML IJ SOLN
20000.0000 [IU] | INTRAMUSCULAR | Status: DC
  Administered 2023-06-07: 20000 [IU] via SUBCUTANEOUS

## 2023-06-07 MED ORDER — EPOETIN ALFA-EPBX 10000 UNIT/ML IJ SOLN
INTRAMUSCULAR | Status: AC
  Filled 2023-06-07: qty 2

## 2023-06-24 DIAGNOSIS — D631 Anemia in chronic kidney disease: Secondary | ICD-10-CM | POA: Diagnosis not present

## 2023-06-24 DIAGNOSIS — E872 Acidosis, unspecified: Secondary | ICD-10-CM | POA: Diagnosis not present

## 2023-06-24 DIAGNOSIS — I639 Cerebral infarction, unspecified: Secondary | ICD-10-CM | POA: Diagnosis not present

## 2023-06-24 DIAGNOSIS — I120 Hypertensive chronic kidney disease with stage 5 chronic kidney disease or end stage renal disease: Secondary | ICD-10-CM | POA: Diagnosis not present

## 2023-06-24 DIAGNOSIS — E1122 Type 2 diabetes mellitus with diabetic chronic kidney disease: Secondary | ICD-10-CM | POA: Diagnosis not present

## 2023-06-24 DIAGNOSIS — N185 Chronic kidney disease, stage 5: Secondary | ICD-10-CM | POA: Diagnosis not present

## 2023-06-24 DIAGNOSIS — N2581 Secondary hyperparathyroidism of renal origin: Secondary | ICD-10-CM | POA: Diagnosis not present

## 2023-07-01 ENCOUNTER — Other Ambulatory Visit: Payer: Self-pay | Admitting: Family Medicine

## 2023-07-05 ENCOUNTER — Ambulatory Visit (HOSPITAL_COMMUNITY)
Admission: RE | Admit: 2023-07-05 | Discharge: 2023-07-05 | Disposition: A | Discharge status: Home / Self Care | Payer: 59 | Source: Ambulatory Visit | Attending: Nephrology | Admitting: Nephrology

## 2023-07-05 VITALS — BP 109/56 | HR 77 | Temp 98.4°F | Resp 77

## 2023-07-05 DIAGNOSIS — N183 Chronic kidney disease, stage 3 unspecified: Secondary | ICD-10-CM | POA: Insufficient documentation

## 2023-07-05 LAB — POCT HEMOGLOBIN-HEMACUE: Hemoglobin: 10.8 g/dL — ABNORMAL LOW (ref 13.0–17.0)

## 2023-07-05 MED ORDER — EPOETIN ALFA-EPBX 10000 UNIT/ML IJ SOLN
20000.0000 [IU] | INTRAMUSCULAR | Status: DC
  Administered 2023-07-05: 20000 [IU] via SUBCUTANEOUS

## 2023-07-05 MED ORDER — EPOETIN ALFA-EPBX 10000 UNIT/ML IJ SOLN
INTRAMUSCULAR | Status: AC
  Filled 2023-07-05: qty 2

## 2023-07-10 ENCOUNTER — Other Ambulatory Visit: Payer: Self-pay | Admitting: Family Medicine

## 2023-07-10 DIAGNOSIS — I129 Hypertensive chronic kidney disease with stage 1 through stage 4 chronic kidney disease, or unspecified chronic kidney disease: Secondary | ICD-10-CM

## 2023-07-12 ENCOUNTER — Ambulatory Visit: Payer: 59 | Admitting: Podiatry

## 2023-07-17 ENCOUNTER — Telehealth: Payer: Self-pay

## 2023-07-17 DIAGNOSIS — G8110 Spastic hemiplegia affecting unspecified side: Secondary | ICD-10-CM

## 2023-07-17 NOTE — Telephone Encounter (Signed)
Copied from CRM [id number]. Topic: Referral - Request for Referral >> Jul 16, 2023  1:09 PM Santiya F wrote: Reason for CRM: Pt is requesting a referral for Physical Therapy   Did the patient discuss referral with their provider in the last year? Yes  Appointment offered? No  Type of order/referral and detailed reason for visit: Physical Therapy   Preference of office, provider, location: Pt says the physical therapy place [address] of [address]   If referral order, have you been seen by this specialty before? No (If Yes, this issue or another issue? When? Where?  Can we respond through MyChart? Yes

## 2023-07-18 NOTE — Telephone Encounter (Signed)
Referral has been placed. 

## 2023-07-18 NOTE — Telephone Encounter (Signed)
Routing to PCP for review.

## 2023-07-18 NOTE — Addendum Note (Signed)
Addended by: Hoy Register on: 07/18/2023 02:50 PM   Modules accepted: Orders

## 2023-07-19 ENCOUNTER — Encounter: Payer: Self-pay | Admitting: Podiatry

## 2023-07-19 ENCOUNTER — Ambulatory Visit (INDEPENDENT_AMBULATORY_CARE_PROVIDER_SITE_OTHER): Payer: 59 | Admitting: Podiatry

## 2023-07-19 DIAGNOSIS — M79676 Pain in unspecified toe(s): Secondary | ICD-10-CM | POA: Diagnosis not present

## 2023-07-19 DIAGNOSIS — E1165 Type 2 diabetes mellitus with hyperglycemia: Secondary | ICD-10-CM | POA: Diagnosis not present

## 2023-07-19 DIAGNOSIS — B351 Tinea unguium: Secondary | ICD-10-CM | POA: Diagnosis not present

## 2023-07-19 NOTE — Telephone Encounter (Signed)
VM has been left informing patient that referral has been placed.

## 2023-07-19 NOTE — Progress Notes (Signed)
This patient returns to my office for at risk foot care.  This patient requires this care by a professional since this patient will be at risk due to having CKD and diabetes and CVA..  This patient is unable to cut nails himself since the patient cannot reach his nails.These nails are painful walking and wearing shoes.  This patient presents for at risk foot care today.  General Appearance  Alert, conversant and in no acute stress.  Vascular  Dorsalis pedis and posterior tibial  pulses are weakly palpable  bilaterally.  Capillary return is within normal limits  bilaterally. Temperature is within normal limits  bilaterally.  Neurologic  Senn-Weinstein monofilament wire test within normal limits  bilaterally. Muscle power within normal limits bilaterally.  Nails Thick disfigured discolored nails with subungual debris  from hallux to fifth toes bilaterally. No evidence of bacterial infection or drainage bilaterally.  Orthopedic  No limitations of motion  feet .  No crepitus or effusions noted.  No bony pathology or digital deformities noted.  Skin  dry scaly skin with no porokeratosis noted bilaterally.  No signs of infections or ulcers noted.     Onychomycosis  Pain in right toes  Pain in left toes  Consent was obtained for treatment procedures.   Mechanical debridement of nails 1-5  bilaterally performed with a nail nipper.  Filed with dremel without incident.    Return office visit   3 months                   Told patient to return for periodic foot care and evaluation due to potential at risk complications.   Helane Gunther DPM

## 2023-07-22 DIAGNOSIS — I120 Hypertensive chronic kidney disease with stage 5 chronic kidney disease or end stage renal disease: Secondary | ICD-10-CM | POA: Diagnosis not present

## 2023-07-22 DIAGNOSIS — D631 Anemia in chronic kidney disease: Secondary | ICD-10-CM | POA: Diagnosis not present

## 2023-07-22 DIAGNOSIS — N2581 Secondary hyperparathyroidism of renal origin: Secondary | ICD-10-CM | POA: Diagnosis not present

## 2023-07-22 DIAGNOSIS — N185 Chronic kidney disease, stage 5: Secondary | ICD-10-CM | POA: Diagnosis not present

## 2023-07-22 DIAGNOSIS — E1122 Type 2 diabetes mellitus with diabetic chronic kidney disease: Secondary | ICD-10-CM | POA: Diagnosis not present

## 2023-07-22 DIAGNOSIS — E872 Acidosis, unspecified: Secondary | ICD-10-CM | POA: Diagnosis not present

## 2023-07-23 LAB — LAB REPORT - SCANNED: EGFR: 8

## 2023-07-28 ENCOUNTER — Other Ambulatory Visit: Payer: Self-pay | Admitting: Family Medicine

## 2023-07-28 DIAGNOSIS — E1122 Type 2 diabetes mellitus with diabetic chronic kidney disease: Secondary | ICD-10-CM

## 2023-08-02 ENCOUNTER — Ambulatory Visit (HOSPITAL_COMMUNITY)
Admission: RE | Admit: 2023-08-02 | Discharge: 2023-08-02 | Disposition: A | Discharge status: Home / Self Care | Payer: 59 | Source: Ambulatory Visit | Attending: Nephrology

## 2023-08-02 VITALS — BP 119/65 | HR 73 | Temp 97.1°F | Resp 73

## 2023-08-02 DIAGNOSIS — N183 Chronic kidney disease, stage 3 unspecified: Secondary | ICD-10-CM | POA: Insufficient documentation

## 2023-08-02 LAB — CBC WITH DIFFERENTIAL/PLATELET
Abs Immature Granulocytes: 0.03 10*3/uL (ref 0.00–0.07)
Basophils Absolute: 0.1 10*3/uL (ref 0.0–0.1)
Basophils Relative: 1 %
Eosinophils Absolute: 0.2 10*3/uL (ref 0.0–0.5)
Eosinophils Relative: 2 %
HCT: 35.8 % — ABNORMAL LOW (ref 39.0–52.0)
Hemoglobin: 11.2 g/dL — ABNORMAL LOW (ref 13.0–17.0)
Immature Granulocytes: 0 %
Lymphocytes Relative: 23 %
Lymphs Abs: 2.3 10*3/uL (ref 0.7–4.0)
MCH: 26.9 pg (ref 26.0–34.0)
MCHC: 31.3 g/dL (ref 30.0–36.0)
MCV: 85.9 fL (ref 80.0–100.0)
Monocytes Absolute: 0.6 10*3/uL (ref 0.1–1.0)
Monocytes Relative: 6 %
Neutro Abs: 7.1 10*3/uL (ref 1.7–7.7)
Neutrophils Relative %: 68 %
Platelets: 296 10*3/uL (ref 150–400)
RBC: 4.17 MIL/uL — ABNORMAL LOW (ref 4.22–5.81)
RDW: 14.8 % (ref 11.5–15.5)
WBC: 10.4 10*3/uL (ref 4.0–10.5)
nRBC: 0 % (ref 0.0–0.2)

## 2023-08-02 LAB — RENAL FUNCTION PANEL
Albumin: 4.1 g/dL (ref 3.5–5.0)
Anion gap: 13 (ref 5–15)
BUN: 55 mg/dL — ABNORMAL HIGH (ref 6–20)
CO2: 20 mmol/L — ABNORMAL LOW (ref 22–32)
Calcium: 10.4 mg/dL — ABNORMAL HIGH (ref 8.9–10.3)
Chloride: 106 mmol/L (ref 98–111)
Creatinine, Ser: 7.19 mg/dL — ABNORMAL HIGH (ref 0.61–1.24)
GFR, Estimated: 8 mL/min — ABNORMAL LOW (ref >60)
Glucose, Bld: 111 mg/dL — ABNORMAL HIGH (ref 70–99)
Phosphorus: 4.8 mg/dL — ABNORMAL HIGH (ref 2.5–4.6)
Potassium: 4.3 mmol/L (ref 3.5–5.1)
Sodium: 139 mmol/L (ref 135–145)

## 2023-08-02 LAB — FERRITIN: Ferritin: 523 ng/mL — ABNORMAL HIGH (ref 24–336)

## 2023-08-02 LAB — POCT HEMOGLOBIN-HEMACUE: Hemoglobin: 11.4 g/dL — ABNORMAL LOW (ref 13.0–17.0)

## 2023-08-02 LAB — IRON AND TIBC
Iron: 79 ug/dL (ref 45–182)
Saturation Ratios: 30 % (ref 17.9–39.5)
TIBC: 266 ug/dL (ref 250–450)
UIBC: 187 ug/dL

## 2023-08-02 MED ORDER — EPOETIN ALFA-EPBX 10000 UNIT/ML IJ SOLN: 20000.0000 [IU] | INTRAMUSCULAR | Status: DC

## 2023-08-02 MED ORDER — EPOETIN ALFA-EPBX 10000 UNIT/ML IJ SOLN
INTRAMUSCULAR | Status: AC
  Administered 2023-08-02: 20000 [IU] via SUBCUTANEOUS
  Filled 2023-08-02: qty 2

## 2023-08-05 ENCOUNTER — Telehealth: Payer: Self-pay | Admitting: Physical Therapy

## 2023-08-05 ENCOUNTER — Ambulatory Visit: Payer: 59 | Attending: Family Medicine | Admitting: Physical Therapy

## 2023-08-05 DIAGNOSIS — R2689 Other abnormalities of gait and mobility: Secondary | ICD-10-CM | POA: Diagnosis not present

## 2023-08-05 DIAGNOSIS — M6281 Muscle weakness (generalized): Secondary | ICD-10-CM | POA: Diagnosis not present

## 2023-08-05 DIAGNOSIS — R262 Difficulty in walking, not elsewhere classified: Secondary | ICD-10-CM | POA: Insufficient documentation

## 2023-08-05 DIAGNOSIS — R2681 Unsteadiness on feet: Secondary | ICD-10-CM | POA: Insufficient documentation

## 2023-08-05 DIAGNOSIS — G8110 Spastic hemiplegia affecting unspecified side: Secondary | ICD-10-CM | POA: Diagnosis not present

## 2023-08-05 NOTE — Telephone Encounter (Signed)
Dr. Alvis Lemmings, Mr. Hearold Tough was evaluated by Physical Therapy on 08/05/2023.  The patient would benefit from an Occupational Therapy evaluation for chronic LUE hemiplegia.   If you agree, please place an order in [facility] workque in [facility] or fax the order to [phone]. Thank you, Peter Congo, PT, DPT, [facility] [address] [place], [place]  [address] Phone:  [phone] Fax:  [phone]

## 2023-08-05 NOTE — Therapy (Signed)
OUTPATIENT PHYSICAL THERAPY NEURO EVALUATION   Patient Name: Tony Schmitt MRN: [id number] DOB:10-19-1970, 52 y.o., male Today's Date: 08/05/2023   PCP: Hoy Register, MD REFERRING PROVIDER: Hoy Register, MD  END OF SESSION:  PT End of Session - 08/05/23 1319     Visit Number 1    Number of Visits 9   with eval   Date for PT Re-Evaluation 10/28/23   to allow for scheduling delays   Authorization Type U[employer] Dual    PT Start Time 1315    PT Stop Time 1351   eval   PT Time Calculation (min) 36 min    Equipment Utilized During Treatment Gait belt    Activity Tolerance Patient tolerated treatment well    Behavior During Therapy WFL for tasks assessed/performed;Flat affect             Past Medical History:  Diagnosis Date   Anemia    Chronic kidney disease    Diabetes mellitus without complication (HCC)    GERD (gastroesophageal reflux disease)    Hypertension    Sleep apnea    Stroke [facility])    Past Surgical History:  Procedure Laterality Date   AV FISTULA PLACEMENT Right 04/30/2023   Procedure: INSERTION OF RIGHT UPPER ARM GORETEX  GRAFT;  Surgeon: Chuck Hint, MD;  Location: [facility] OR;  Service: Vascular;  Laterality: Right;   BUBBLE STUDY  04/18/2020   Procedure: BUBBLE STUDY;  Surgeon: Quintella Reichert, MD;  Location: [facility] ENDOSCOPY;  Service: Cardiovascular;;   TEE WITHOUT CARDIOVERSION N/A 04/18/2020   Procedure: TRANSESOPHAGEAL ECHOCARDIOGRAM (TEE);  Surgeon: Quintella Reichert, MD;  Location: [facility] ENDOSCOPY;  Service: Cardiovascular;  Laterality: N/A;   Patient Active Problem List   Diagnosis Date Noted   Spastic hemiplegia, unspecified etiology, unspecified laterality (HCC) 01/07/2023   Acquired left foot drop 05/22/2022   Nephrotic syndrome    Peripheral edema    Abnormality of gait 05/23/2020   Type 2 diabetes mellitus with hyperglycemia, without long-term current use of insulin (HCC)    Thrombocytosis    Benign essential HTN    Acute blood loss anemia     Leukocytosis    Slow transit constipation    AKI (acute kidney injury) (HCC)    Diabetes mellitus type 2 with complications, uncontrolled    CVA (cerebral vascular accident) (HCC) 04/22/2020   Acute left-sided weakness    Tobacco abuse    Essential hypertension    Acute CVA (cerebrovascular accident) (HCC) 04/16/2020   Hypertensive urgency 04/16/2020   Type 2 diabetes mellitus with vascular disease (HCC) 04/16/2020   CKD (chronic kidney disease) stage 3, GFR 30-59 ml/min (HCC) 04/23/2019   Hyperlipidemia 04/18/2019   Dyslipidemia associated with type 2 diabetes mellitus (HCC) 08/29/2018   Hypertension associated with type 2 diabetes mellitus (HCC) 08/29/2018    ONSET DATE: 07/18/2023  REFERRING DIAG: G81.10 (ICD-10-CM) - Spastic hemiplegia, unspecified etiology, unspecified laterality (HCC)  THERAPY DIAG:  Difficulty in walking, not elsewhere classified  Muscle weakness (generalized)  Unsteadiness on feet  Other abnormalities of gait and mobility  Rationale for Evaluation and Treatment: Rehabilitation  SUBJECTIVE:  SUBJECTIVE STATEMENT: Pt states, "I'm trying to get a kidney", reports he was told he needs to exercise and strengthen his body and overall "improve". Pt previously did PT back in January 2024 and did aquatic therapy at the time, clinic was too far from his home. Regarding working on his prescribed HEP he reports he does try to work on his L hand. Pt describes his L side as "heavy", history of stroke in 2021.  Pt also has had a fistula placed in his RUE, has not started dialysis.  Pt accompanied by: family member mom (in lobby)  PERTINENT HISTORY: CKD, DM, HTN, stroke (2021)  PAIN:  Are you having pain? No  PRECAUTIONS: Fall and Other: fistula on R arm  RED  FLAGS: None   WEIGHT BEARING RESTRICTIONS: No  FALLS: Has patient fallen in last 6 months? No  LIVING ENVIRONMENT: Lives with: lives with their family Lives in: House/apartment Stairs: Yes: External: 4 steps; can reach both Has following equipment at home: Single point cane, Walker - 2 wheeled, Wheelchair (manual), and shower chair  PLOF: Independent with gait, Independent with transfers, Requires assistive device for independence, and Needs assistance with ADLs  PATIENT GOALS: "get my hands to get a grip" "work towards being able to get a kidney"  OBJECTIVE:  Note: Objective measures were completed at Evaluation unless otherwise noted.  DIAGNOSTIC FINDINGS: None relevant to this POC  COGNITION: Overall cognitive status: Within functional limits for tasks assessed   SENSATION: Gets N/T in L hemibody every 3 months or so  EDEMA:  Swelling in L hand  POSTURE: rounded shoulders, forward head, and posterior pelvic tilt  LOWER EXTREMITY ROM:     Active  Right Eval Left Eval  Hip flexion    Hip extension    Hip abduction    Hip adduction    Hip internal rotation    Hip external rotation    Knee flexion    Knee extension    Ankle dorsiflexion    Ankle plantarflexion    Ankle inversion    Ankle eversion     (Blank rows = not tested)  LOWER EXTREMITY MMT:    MMT Right Eval Left Eval  Hip flexion 4 3  Hip extension    Hip abduction    Hip adduction    Hip internal rotation    Hip external rotation    Knee flexion 5 2-  Knee extension 5 2-  Ankle dorsiflexion 5 3  Ankle plantarflexion    Ankle inversion    Ankle eversion    (Blank rows = not tested)  BED MOBILITY:  Mod I per pt report, utilizes bedrail and has an adjustable bed  TRANSFERS: Assistive device utilized: None  Sit to stand: Modified independence Stand to sit: Modified independence Chair to chair: Modified independence  GAIT: Gait pattern: decreased arm swing- Left, decreased hip/knee  flexion- Left, decreased ankle dorsiflexion- Left, circumduction- Left, lateral lean- Right, and abducted- Left Distance walked: various clinic distances Assistive device utilized: Single point cane Level of assistance: Modified independence Comments: L hemiparesis  FUNCTIONAL TESTS:    [facility] PT Assessment - 08/05/23 1334       Ambulation/Gait   Gait velocity 32.8 ft over 24.92 sec = 1.32 ft/sec   with [facility]     Standardized Balance Assessment   Standardized Balance Assessment Five Times Sit to Stand;Timed Up and Go Test;Berg Balance Test    Five times sit to stand comments  15.56 sec   no UE  Berg Balance Test   Sit to Stand Able to stand without using hands and stabilize independently    Standing Unsupported Able to stand safely 2 minutes    Sitting with Back Unsupported but Feet Supported on Floor or Stool Able to sit safely and securely 2 minutes    Stand to Sit Sits safely with minimal use of hands    Transfers Able to transfer safely, minor use of hands    Standing Unsupported with Eyes Closed Able to stand 10 seconds with supervision    Standing Unsupported with Feet Together Able to place feet together independently and stand for 1 minute with supervision    From Standing, Reach Forward with Outstretched Arm Reaches forward but needs supervision    From Standing Position, Pick up Object from Floor Unable to try/needs assist to keep balance    From Standing Position, Turn to Look Behind Over each Shoulder Turn sideways only but maintains balance    Turn 360 Degrees Needs close supervision or verbal cueing    Standing Unsupported, Alternately Place Feet on Step/Stool Needs assistance to keep from falling or unable to try    Standing Unsupported, One Foot in Front Able to take small step independently and hold 30 seconds    Standing on One Leg Able to lift leg independently and hold equal to or more than 3 seconds    Total Score 34    Berg comment: 34/56, high fall risk       Timed Up and Go Test   TUG Normal TUG    Normal TUG (seconds) 23.81   with SPC                                                                                                                                       TREATMENT: PT Eval    PATIENT EDUCATION: Education details: Eval findings, PT POC, results of OM and functional implications Person educated: Patient Education method: Explanation and Demonstration Education comprehension: verbalized understanding, returned demonstration, and needs further education  HOME EXERCISE PROGRAM: To be initiated  GOALS: Goals reviewed with patient? Yes  SHORT TERM GOALS: Target date: 09/02/2023  Pt will be independent with initial HEP for improved strength, balance, transfers and gait. Baseline: Goal status: INITIAL  2.  Pt will improve gait velocity to at least 1.75 ft/sec for improved gait efficiency and performance at mod I level  Baseline: 1.32 ft/sec mod I SPC (12/30) Goal status: INITIAL  3.  Pt will improve normal TUG to less than or equal to 19 seconds for improved functional mobility and decreased fall risk. Baseline: 23.81 sec (12/30) Goal status: INITIAL  4.  Pt will improve Berg score to 39/56 for decreased fall risk Baseline: 34/56 (12/30) Goal status: INITIAL    LONG TERM GOALS: Target date: 10/03/2023    Pt will be independent with final HEP for improved strength, balance, transfers and gait.  Baseline:  Goal status: INITIAL  2.  Pt will improve gait velocity to at least 2.0 ft/sec for improved gait efficiency and performance at mod I level  Baseline: 1.32 ft/sec mod I SPC (12/30) Goal status: INITIAL  3.  Pt will improve normal TUG to less than or equal to 15 seconds for improved functional mobility and decreased fall risk. Baseline: 23.81 sec (12/30)  Goal status: INITIAL  4.  Pt will improve Berg score to 44/56 for decreased fall risk Baseline: 34/56 (12/30) Goal status:  INITIAL    ASSESSMENT:  CLINICAL IMPRESSION: Patient is a 52 year old male referred to Neuro OPPT for chronic spastic hemiplegia.   Pt's PMH is significant for: CKD, DM, HTN, stroke (2021). The following deficits were present during the exam: decreased LLE strength and ROM, impaired balance, sensory impairments, and impaired coordination. Based on his gait speed of 1.32 ft/sec, TUG score of 23.81 sec, and Berg score of 34/56, pt is an increased risk for falls. Pt would benefit from skilled PT to address these impairments and functional limitations to maximize functional mobility independence.   OBJECTIVE IMPAIRMENTS: Abnormal gait, decreased balance, decreased coordination, decreased mobility, difficulty walking, decreased ROM, decreased strength, increased muscle spasms, impaired sensation, impaired tone, impaired UE functional use, improper body mechanics, and postural dysfunction.   ACTIVITY LIMITATIONS: carrying, lifting, bending, stairs, transfers, dressing, and reach over head  PARTICIPATION LIMITATIONS: driving and community activity  PERSONAL FACTORS: Time since onset of injury/illness/exacerbation, Transportation, and 3+ comorbidities:    CKD, DM, HTN, strokeare also affecting patient's functional outcome.   REHAB POTENTIAL: Fair chronicity of CVA  CLINICAL DECISION MAKING: Stable/uncomplicated  EVALUATION COMPLEXITY: Low  PLAN:  PT FREQUENCY: 1x/week  PT DURATION: 8 weeks  PLANNED INTERVENTIONS: 97164- PT Re-evaluation, 97110-Therapeutic exercises, 97530- Therapeutic activity, [id number]- Neuromuscular re-education, 97535- Self Care, [address]- Manual therapy, [phone]- Gait training, [phone]- Orthotic Fit/training, [phone]- Aquatic Therapy, [phone]- Electrical stimulation (manual), Patient/Family education, Balance training, Stair training, Taping, Dry Needling, Joint mobilization, Spinal mobilization, DME instructions, Cryotherapy, and Moist heat  PLAN FOR NEXT SESSION: did we get OT  referral?, further assess LLE spasticity/clonus, initiate HEP to work on functional strengthening of LLE (tall kneel? Staggered sit to stands), powder board?   Peter Congo, PT Peter Congo, PT, DPT, CSRS  08/05/2023, 4:31 PM

## 2023-08-06 NOTE — Telephone Encounter (Signed)
Referral has been placed. 

## 2023-08-15 ENCOUNTER — Encounter: Payer: Self-pay | Admitting: Physical Therapy

## 2023-08-15 ENCOUNTER — Encounter: Payer: Self-pay | Admitting: Occupational Therapy

## 2023-08-15 ENCOUNTER — Ambulatory Visit: Payer: 59 | Attending: Family Medicine | Admitting: Physical Therapy

## 2023-08-15 ENCOUNTER — Ambulatory Visit: Payer: 59 | Admitting: Occupational Therapy

## 2023-08-15 VITALS — BP 118/59 | HR 72

## 2023-08-15 DIAGNOSIS — R29898 Other symptoms and signs involving the musculoskeletal system: Secondary | ICD-10-CM | POA: Diagnosis not present

## 2023-08-15 DIAGNOSIS — M25642 Stiffness of left hand, not elsewhere classified: Secondary | ICD-10-CM | POA: Diagnosis not present

## 2023-08-15 DIAGNOSIS — M25632 Stiffness of left wrist, not elsewhere classified: Secondary | ICD-10-CM | POA: Diagnosis not present

## 2023-08-15 DIAGNOSIS — R2689 Other abnormalities of gait and mobility: Secondary | ICD-10-CM | POA: Diagnosis not present

## 2023-08-15 DIAGNOSIS — R2681 Unsteadiness on feet: Secondary | ICD-10-CM | POA: Diagnosis not present

## 2023-08-15 DIAGNOSIS — M6281 Muscle weakness (generalized): Secondary | ICD-10-CM | POA: Diagnosis not present

## 2023-08-15 DIAGNOSIS — R262 Difficulty in walking, not elsewhere classified: Secondary | ICD-10-CM | POA: Diagnosis not present

## 2023-08-15 DIAGNOSIS — R278 Other lack of coordination: Secondary | ICD-10-CM | POA: Diagnosis not present

## 2023-08-15 NOTE — Therapy (Signed)
 OUTPATIENT OCCUPATIONAL THERAPY NEURO EVALUATION  Patient Name: Tony Schmitt MRN: [id number] DOB:1970/10/15, 53 y.o., male Today's Date: 08/15/2023  PCP: Delbert Clam, MD REFERRING PROVIDER: Delbert Clam, MD   END OF SESSION:  OT End of Session - 08/15/23 1405     Visit Number 1    Number of Visits 9    Date for OT Re-Evaluation 10/11/23    Authorization Type UHC Medicare - requires auth    Progress Note Due on Visit 9    OT Start Time 1406    OT Stop Time 1445    OT Time Calculation (min) 39 min    Activity Tolerance Patient tolerated treatment well    Behavior During Therapy WFL for tasks assessed/performed;Flat affect             Past Medical History:  Diagnosis Date   Anemia    Chronic kidney disease    Diabetes mellitus without complication (HCC)    GERD (gastroesophageal reflux disease)    Hypertension    Sleep apnea    Stroke [facility])    Past Surgical History:  Procedure Laterality Date   AV FISTULA PLACEMENT Right 04/30/2023   Procedure: INSERTION OF RIGHT UPPER ARM GORETEX  GRAFT;  Surgeon: Eliza Lonni RAMAN, MD;  Location: [facility] OR;  Service: Vascular;  Laterality: Right;   BUBBLE STUDY  04/18/2020   Procedure: BUBBLE STUDY;  Surgeon: Shlomo Wilbert SAUNDERS, MD;  Location: [facility] ENDOSCOPY;  Service: Cardiovascular;;   TEE WITHOUT CARDIOVERSION N/A 04/18/2020   Procedure: TRANSESOPHAGEAL ECHOCARDIOGRAM (TEE);  Surgeon: Shlomo Wilbert SAUNDERS, MD;  Location: [facility] ENDOSCOPY;  Service: Cardiovascular;  Laterality: N/A;   Patient Active Problem List   Diagnosis Date Noted   Spastic hemiplegia, unspecified etiology, unspecified laterality (HCC) 01/07/2023   Acquired left foot drop 05/22/2022   Nephrotic syndrome    Peripheral edema    Abnormality of gait 05/23/2020   Type 2 diabetes mellitus with hyperglycemia, without long-term current use of insulin  (HCC)    Thrombocytosis    Benign essential HTN    Acute blood loss anemia    Leukocytosis    Slow transit constipation     AKI (acute kidney injury) (HCC)    Diabetes mellitus type 2 with complications, uncontrolled    CVA (cerebral vascular accident) (HCC) 04/22/2020   Acute left-sided weakness    Tobacco abuse    Essential hypertension    Acute CVA (cerebrovascular accident) (HCC) 04/16/2020   Hypertensive urgency 04/16/2020   Type 2 diabetes mellitus with vascular disease (HCC) 04/16/2020   CKD (chronic kidney disease) stage 3, GFR 30-59 ml/min (HCC) 04/23/2019   Hyperlipidemia 04/18/2019   Dyslipidemia associated with type 2 diabetes mellitus (HCC) 08/29/2018   Hypertension associated with type 2 diabetes mellitus (HCC) 08/29/2018    ONSET DATE: 08/06/2023 (Date of referral)  REFERRING DIAG: M62.81 (ICD-10-CM) - Muscle weakness (generalized)   THERAPY DIAG:  Muscle weakness (generalized)  Other symptoms and signs involving the musculoskeletal system  Rationale for Evaluation and Treatment: Rehabilitation  SUBJECTIVE:   SUBJECTIVE STATEMENT: He has not had formal OT since his stroke. He did go to [facility] for therapy immediately after his stroke. They did use NMES, but he does not have a unit at home. He has resting hand splint but hasn't been wearing recently.  Pt accompanied by: self  PERTINENT HISTORY: CKD, DM, HTN, infarct stroke (2021)  Reason for Referral: Left upper extremity weakness   PRECAUTIONS: Fall and Other: fistula on R arm; latex allergy  WEIGHT BEARING RESTRICTIONS: No  PAIN:  Are you having pain? No  FALLS: Has patient fallen in last 6 months? No  LIVING ENVIRONMENT: Lives with: lives with their family Lives in: House/apartment Stairs: Yes: External: 4 steps; can reach both Has following equipment at home: Single point cane, Walker - 2 wheeled, Wheelchair (manual), and shower chair   PLOF: Independent with gait, Independent with transfers, Requires assistive device for independence, and Needs assistance with ADLs  PATIENT GOALS: get my hands to get a grip    OBJECTIVE:  Note: Objective measures were completed at Evaluation unless otherwise noted.  HAND DOMINANCE: Right  ADLs: Eating: assistance to cut food Grooming: mod I UB Dressing: mod I LB Dressing: mod I Toileting: mod I Bathing: mod I  Equipment: Shower seat with back and Long handled sponge  IADLs: Shopping: mod I Light housekeeping: only cleans bathroom, no assistance Meal Prep: light meal prep mod I Community mobility: dependent Medication management: dependent Financial management: dependent Handwriting:  no change  MOBILITY STATUS: Needs Assist: SPC  ACTIVITY TOLERANCE: Activity tolerance: good to fair  FUNCTIONAL OUTCOME MEASURES: Quick Dash: 38.6 %   UPPER EXTREMITY ROM:     AROM Right (eval) Left (eval)  Shoulder flexion WNL 80  Shoulder abduction WNL 70  Elbow flexion WNL 90  Elbow extension WNL -30  Wrist flexion WNL slight  Wrist extension WNL slight  Wrist pronation WNL lacks active motion  Wrist supination WNL lacks active motion   Digit Composite Flexion WNL lacks active motion  Digit Composite Extension WNL lacks active motion  Digit Opposition WNL lacks active motion  (Blank rows = not tested)  LUE ER just above shoulder; IR just in front of hip  HAND FUNCTION: Lacks active motion  COORDINATION: lacks active motion  SENSATION: WFL  EDEMA: mild edema to L hand  MUSCLE TONE: LUE: Moderate  COGNITION: Overall cognitive status: History of cognitive impairments - at baseline  VISION: Subjective report: no change Baseline vision: Wears glasses all the time and Wears glasses for reading only Visual history:  n/a  PERCEPTION: Not tested  PRAXIS: Not tested  OBSERVATIONS: Pt appears well kept. Significant spasticity to LUE with minimal AROM. SPC for ambulation.                                                                                                                           TREATMENT DATE:    N/A for this  visit  PATIENT EDUCATION: Education details: OT Role and POC Person educated: Patient Education method: Explanation Education comprehension: verbalized understanding  HOME EXERCISE PROGRAM: N/A for this visit  GOALS:  SHORT TERM GOALS: Target date: 09/12/2023    Pt will independently recall at least 3 ways to manage LUE spasticity. Baseline: Goal status: INITIAL  2.  Pt will demonstrate volitional use of LUE (I.e. for stabilization) at least 25% of the time. Baseline: 0% - required cues, holds LUE in guarded position Goal status: INITIAL  LONG TERM GOALS: Target date: 10/11/2023  Patient will demonstrate updated LUE HEP with 25% verbal cues or less for proper execution. Baseline:  Goal status: INITIAL  2.  Pt will report no more than moderate difficulty cutting food using adaptive strategies/AD as needed. Baseline: unable Goal status: INITIAL   ASSESSMENT:  CLINICAL IMPRESSION: Patient is a 53 y.o. male who was seen today for occupational therapy evaluation for LUE weakness. Hx includes CKD, DM, HTN, infarct stroke (2021). Patient currently presents below baseline level of functioning demonstrating functional deficits and impairments as noted below. Pt would benefit from skilled OT services in the outpatient setting to work on impairments as noted below to help pt return to PLOF as able.    PERFORMANCE DEFICITS: in functional skills including ADLs, IADLs, coordination, tone, ROM, strength, Fine motor control, Gross motor control, decreased knowledge of use of DME, and UE functional use.   IMPAIRMENTS: are limiting patient from ADLs, IADLs, leisure, and social participation.   CO-MORBIDITIES: may have co-morbidities  that affects occupational performance. Patient will benefit from skilled OT to address above impairments and improve overall function.  MODIFICATION OR ASSISTANCE TO COMPLETE EVALUATION: Min-Moderate modification of tasks or assist with assess necessary to  complete an evaluation.  OT OCCUPATIONAL PROFILE AND HISTORY: Problem focused assessment: Including review of records relating to presenting problem.  CLINICAL DECISION MAKING: LOW - limited treatment options, no task modification necessary  REHAB POTENTIAL:  Fair chronicity of CVA   EVALUATION COMPLEXITY: Low    PLAN:  OT FREQUENCY: 1x/week  OT DURATION: 8 weeks  PLANNED INTERVENTIONS: 97168 OT Re-evaluation, 97535 self care/ADL training, 02889 therapeutic exercise, 97530 therapeutic activity, 97140 manual therapy, 97035 ultrasound, [id number] electrical stimulation (manual), [id number] Orthotics management and training, 02239 Splinting (initial encounter), [id number] Subsequent splinting/medication, passive range of motion, functional mobility training, patient/family education, and DME and/or AE instructions  RECOMMENDED OTHER SERVICES: N/A for this visit  CONSULTED AND AGREED WITH PLAN OF CARE: Patient  PLAN FOR NEXT SESSION: ROM HEP; NMES; adjust splint as needed   Jocelyn CHRISTELLA Bottom, OT 08/15/2023, 2:50 PM

## 2023-08-15 NOTE — Therapy (Signed)
 OUTPATIENT PHYSICAL THERAPY NEURO TREATMENT   Patient Name: Tony Schmitt MRN: [id number] DOB:1971-01-05, 53 y.o., male Today's Date: 08/15/2023   PCP: Delbert Clam, MD REFERRING PROVIDER: Delbert Clam, MD  END OF SESSION:  PT End of Session - 08/15/23 1500     Visit Number 2    Number of Visits 9   with eval   Date for PT Re-Evaluation 10/28/23   to allow for scheduling delays   Authorization Type UHC Dual    PT Start Time 1449    PT Stop Time 1530    PT Time Calculation (min) 41 min    Equipment Utilized During Treatment Gait belt    Activity Tolerance Patient tolerated treatment well    Behavior During Therapy WFL for tasks assessed/performed;Flat affect              Past Medical History:  Diagnosis Date   Anemia    Chronic kidney disease    Diabetes mellitus without complication (HCC)    GERD (gastroesophageal reflux disease)    Hypertension    Sleep apnea    Stroke [facility])    Past Surgical History:  Procedure Laterality Date   AV FISTULA PLACEMENT Right 04/30/2023   Procedure: INSERTION OF RIGHT UPPER ARM GORETEX  GRAFT;  Surgeon: Eliza Lonni RAMAN, MD;  Location: [facility] OR;  Service: Vascular;  Laterality: Right;   BUBBLE STUDY  04/18/2020   Procedure: BUBBLE STUDY;  Surgeon: Shlomo Wilbert SAUNDERS, MD;  Location: [facility] ENDOSCOPY;  Service: Cardiovascular;;   TEE WITHOUT CARDIOVERSION N/A 04/18/2020   Procedure: TRANSESOPHAGEAL ECHOCARDIOGRAM (TEE);  Surgeon: Shlomo Wilbert SAUNDERS, MD;  Location: [facility] ENDOSCOPY;  Service: Cardiovascular;  Laterality: N/A;   Patient Active Problem List   Diagnosis Date Noted   Spastic hemiplegia, unspecified etiology, unspecified laterality (HCC) 01/07/2023   Acquired left foot drop 05/22/2022   Nephrotic syndrome    Peripheral edema    Abnormality of gait 05/23/2020   Type 2 diabetes mellitus with hyperglycemia, without long-term current use of insulin  (HCC)    Thrombocytosis    Benign essential HTN    Acute blood loss anemia     Leukocytosis    Slow transit constipation    AKI (acute kidney injury) (HCC)    Diabetes mellitus type 2 with complications, uncontrolled    CVA (cerebral vascular accident) (HCC) 04/22/2020   Acute left-sided weakness    Tobacco abuse    Essential hypertension    Acute CVA (cerebrovascular accident) (HCC) 04/16/2020   Hypertensive urgency 04/16/2020   Type 2 diabetes mellitus with vascular disease (HCC) 04/16/2020   CKD (chronic kidney disease) stage 3, GFR 30-59 ml/min (HCC) 04/23/2019   Hyperlipidemia 04/18/2019   Dyslipidemia associated with type 2 diabetes mellitus (HCC) 08/29/2018   Hypertension associated with type 2 diabetes mellitus (HCC) 08/29/2018    ONSET DATE: 07/18/2023  REFERRING DIAG: G81.10 (ICD-10-CM) - Spastic hemiplegia, unspecified etiology, unspecified laterality (HCC)  THERAPY DIAG:  Muscle weakness (generalized)  Difficulty in walking, not elsewhere classified  Unsteadiness on feet  Other abnormalities of gait and mobility  Rationale for Evaluation and Treatment: Rehabilitation  SUBJECTIVE:  SUBJECTIVE STATEMENT: Pt states recent BP medicine adjustment.  He denies recent falls or acute changes and is having no pain today.  Pt also has had a fistula placed in his RUE, has not started dialysis.  Pt accompanied by: family member mom (in lobby)  PERTINENT HISTORY: CKD, DM, HTN, stroke (2021)  PAIN:  Are you having pain? No  PRECAUTIONS: Fall and Other: fistula on R arm  RED FLAGS: None   WEIGHT BEARING RESTRICTIONS: No  FALLS: Has patient fallen in last 6 months? No  LIVING ENVIRONMENT: Lives with: lives with their family Lives in: House/apartment Stairs: Yes: External: 4 steps; can reach both Has following equipment at home: Single point cane, Walker - 2  wheeled, Wheelchair (manual), and shower chair  PLOF: Independent with gait, Independent with transfers, Requires assistive device for independence, and Needs assistance with ADLs  PATIENT GOALS: get my hands to get a grip work towards being able to get a kidney  OBJECTIVE:  Note: Objective measures were completed at Evaluation unless otherwise noted.  DIAGNOSTIC FINDINGS: None relevant to this POC  COGNITION: Overall cognitive status: Within functional limits for tasks assessed   SENSATION: Gets N/T in L hemibody every 3 months or so  EDEMA:  Swelling in L hand  POSTURE: rounded shoulders, forward head, and posterior pelvic tilt  LOWER EXTREMITY ROM:     Active  Right Eval Left Eval  Hip flexion    Hip extension    Hip abduction    Hip adduction    Hip internal rotation    Hip external rotation    Knee flexion    Knee extension    Ankle dorsiflexion    Ankle plantarflexion    Ankle inversion    Ankle eversion     (Blank rows = not tested)  LOWER EXTREMITY MMT:    MMT Right Eval Left Eval  Hip flexion 4 3  Hip extension    Hip abduction    Hip adduction    Hip internal rotation    Hip external rotation    Knee flexion 5 2-  Knee extension 5 2-  Ankle dorsiflexion 5 3  Ankle plantarflexion    Ankle inversion    Ankle eversion    (Blank rows = not tested)  BED MOBILITY:  Mod I per pt report, utilizes bedrail and has an adjustable bed  TRANSFERS: Assistive device utilized: None  Sit to stand: Modified independence Stand to sit: Modified independence Chair to chair: Modified independence  GAIT: Gait pattern: decreased arm swing- Left, decreased hip/knee flexion- Left, decreased ankle dorsiflexion- Left, circumduction- Left, lateral lean- Right, and abducted- Left Distance walked: various clinic distances Assistive device utilized: Single point cane Level of assistance: Modified independence Comments: L hemiparesis  FUNCTIONAL TESTS:  TREATMENT: 08/15/2023 -Assessed spasticity/clonus:  some increased tightness into knee flexion, no clonus, possibly some beneficial extensor tone in standing/ambulation.  Initiated HEP:   -Staggered STS w/ LLE in rear using kettlebell to prevent reflexive reliance on sound side 3x8 CGA into complete upright -Stride stance weight shifting x3 minutes w/ frequent redirection to task -Lateral weight shift x2 minutes w/ cues to push from hips to improve ROM  -Standing on LLE sliding bean bags to target w/ RLE x3 rounds SBA-CGA  PATIENT EDUCATION: Education details: Reminder to schedule OT appts.  Initial HEP - pushing with the hips during weight shifting and paying attention to RLE positioning during repeated STS. Person educated: Patient Education method: Medical Illustrator Education comprehension: verbalized understanding, returned demonstration, and needs further education  HOME EXERCISE PROGRAM: Access Code: [email or web address] URL: [email or web address] Date: 08/15/2023 Prepared by: Daved Bull  Exercises - Staggered Sit-to-Stand  - 1 x daily - 7 x weekly - 3 sets - 8-10 reps - Side to Side Weight Shift with Counter Support  - 1 x daily - 7 x weekly - 3 sets - 10 reps - Staggered Stance Forward Backward Weight Shift with Counter Support  - 1 x daily - 7 x weekly - 3 sets - 10 reps  GOALS: Goals reviewed with patient? Yes  SHORT TERM GOALS: Target date: 09/02/2023  Pt will be independent with initial HEP for improved strength, balance, transfers and gait. Baseline: Goal status: INITIAL  2.  Pt will improve gait velocity to at least 1.75 ft/sec for improved gait efficiency and performance at mod I level  Baseline: 1.32 ft/sec mod I SPC (12/30) Goal status: INITIAL  3.  Pt will improve normal TUG to less than or equal to 19 seconds for  improved functional mobility and decreased fall risk. Baseline: 23.81 sec (12/30) Goal status: INITIAL  4.  Pt will improve Berg score to 39/56 for decreased fall risk Baseline: 34/56 (12/30) Goal status: INITIAL    LONG TERM GOALS: Target date: 10/03/2023    Pt will be independent with final HEP for improved strength, balance, transfers and gait. Baseline:  Goal status: INITIAL  2.  Pt will improve gait velocity to at least 2.0 ft/sec for improved gait efficiency and performance at mod I level  Baseline: 1.32 ft/sec mod I SPC (12/30) Goal status: INITIAL  3.  Pt will improve normal TUG to less than or equal to 15 seconds for improved functional mobility and decreased fall risk. Baseline: 23.81 sec (12/30)  Goal status: INITIAL  4.  Pt will improve Berg score to 44/56 for decreased fall risk Baseline: 34/56 (12/30) Goal status: INITIAL    ASSESSMENT:  CLINICAL IMPRESSION: Emphasis of skilled session on left focused NMR and initiating HEP to promote progress in home environment.  He requires some redirection to tasks without obvious safety concerns with HEP, but pt would likely benefit from review of tasks in future session to ensure retention of cuing today.  He has some mild knee flexion in left stance during SLS task at end of session that worsened with fatigue.  He continues to benefit from skilled PT to address functional mobility and improved hemibody deficits.  Will continue per POC.   OBJECTIVE IMPAIRMENTS: Abnormal gait, decreased balance, decreased coordination, decreased mobility, difficulty walking, decreased ROM, decreased strength, increased muscle spasms, impaired sensation, impaired tone, impaired UE functional use, improper body mechanics, and postural dysfunction.   ACTIVITY LIMITATIONS: carrying, lifting, bending, stairs, transfers, dressing, and reach over head  PARTICIPATION  LIMITATIONS: driving and community activity  PERSONAL FACTORS: Time since onset  of injury/illness/exacerbation, Transportation, and 3+ comorbidities:    CKD, DM, HTN, strokeare also affecting patient's functional outcome.   REHAB POTENTIAL: Fair chronicity of CVA  CLINICAL DECISION MAKING: Stable/uncomplicated  EVALUATION COMPLEXITY: Low  PLAN:  PT FREQUENCY: 1x/week  PT DURATION: 8 weeks  PLANNED INTERVENTIONS: 97164- PT Re-evaluation, 97110-Therapeutic exercises, 97530- Therapeutic activity, [id number]- Neuromuscular re-education, 97535- Self Care, 02859- Manual therapy, [phone]- Gait training, [phone]- Orthotic Fit/training, [phone]- Aquatic Therapy, [phone]- Electrical stimulation (manual), Patient/Family education, Balance training, Stair training, Taping, Dry Needling, Joint mobilization, Spinal mobilization, DME instructions, Cryotherapy, and Moist heat  PLAN FOR NEXT SESSION:  initiate HEP to work on functional strengthening of LLE (tall kneel? Staggered sit to stands), powder board?  Left NMR   Daved KATHEE Bull, PT, DPT  08/15/2023, 3:35 PM

## 2023-08-15 NOTE — Patient Instructions (Signed)
 Access Code: [email or web address] URL: [email or web address] Date: 08/15/2023 Prepared by: Daved Bull  Exercises - Staggered Sit-to-Stand  - 1 x daily - 7 x weekly - 3 sets - 8-10 reps - Side to Side Weight Shift with Counter Support  - 1 x daily - 7 x weekly - 3 sets - 10 reps - Staggered Stance Forward Backward Weight Shift with Counter Support  - 1 x daily - 7 x weekly - 3 sets - 10 reps

## 2023-08-18 ENCOUNTER — Other Ambulatory Visit: Payer: Self-pay | Admitting: Family Medicine

## 2023-08-18 DIAGNOSIS — E1122 Type 2 diabetes mellitus with diabetic chronic kidney disease: Secondary | ICD-10-CM

## 2023-08-19 ENCOUNTER — Encounter: Payer: 59 | Admitting: Family Medicine

## 2023-08-22 ENCOUNTER — Ambulatory Visit: Payer: 59 | Admitting: Occupational Therapy

## 2023-08-22 ENCOUNTER — Ambulatory Visit: Payer: 59 | Admitting: Physical Therapy

## 2023-08-22 ENCOUNTER — Other Ambulatory Visit: Payer: Self-pay | Admitting: Family Medicine

## 2023-08-22 DIAGNOSIS — E1122 Type 2 diabetes mellitus with diabetic chronic kidney disease: Secondary | ICD-10-CM

## 2023-08-22 MED ORDER — AMLODIPINE BESYLATE 10 MG PO TABS: 10.0000 mg | ORAL_TABLET | Freq: Every day | ORAL | 0 refills | Status: DC

## 2023-08-22 NOTE — Telephone Encounter (Signed)
Patient must keep upcoming appointment for further refills. Requested Prescriptions  Pending Prescriptions Disp Refills   amLODipine (NORVASC) 10 MG tablet 30 tablet 10    Sig: Take 1 tablet (10 mg total) by mouth daily.     Cardiovascular: Calcium Channel Blockers 2 Failed - 08/22/2023  5:50 PM      Failed - Valid encounter within last 6 months    Recent Outpatient Visits           7 months ago Type 2 diabetes mellitus with other specified complication, without long-term current use of insulin (HCC)   [facility] [place]. [facility] Hoy Register, MD   1 year ago Type 2 diabetes mellitus with other specified complication, without long-term current use of insulin (HCC)   [facility] Of [place]. [facility] Hoy Register, MD   1 year ago Type 2 diabetes mellitus with other specified complication, without long-term current use of insulin (HCC)   [facility] - [facility] Dept Of [place]. [facility] Hoy Register, MD   1 year ago Hypertension in stage 4 chronic kidney disease due to type 2 diabetes mellitus [facility])   [facility] - [facility] Of [place]. [facility] Lois Huxley, [place] Marsh L, RPH-CPP   1 year ago Hypertension in stage 4 chronic kidney disease due to type 2 diabetes mellitus [facility])   [facility] - A Dept Of . [facility] Lois Huxley, Cornelius Moras, RPH-CPP       Future Appointments             In 3 weeks Hoy Register, MD [facility] - A Dept Of [place]. [facility]            Passed - Last BP in normal range    BP Readings from Last 1 Encounters:  08/15/23 (!) 118/59         Passed - Last Heart Rate in normal range    Pulse Readings from Last 1 Encounters:  08/15/23 72

## 2023-08-22 NOTE — Telephone Encounter (Signed)
Medication Refill -  Most Recent Primary Care Visit:  Provider: [facility]  Department: [facility]  Visit Type: LAB  Date: 01/11/2023  Medication: amLODipine (NORVASC) 10 MG tablet   Has the patient contacted their pharmacy? No (Agent: If no, request that the patient contact the pharmacy for the refill. If patient does not wish to contact the pharmacy document the reason why and proceed with request.) (Agent: If yes, when and what did the pharmacy advise?)  Is this the correct pharmacy for this prescription? Yes If no, delete pharmacy and type the correct one.  This is the patient's preferred pharmacy:  [facility] [address] ([address]), [place] - [address] [place] ([place]) [place] [address] Phone: [phone] Fax: [phone]   Has the prescription been filled recently? No  Is the patient out of the medication? Yes  Has the patient been seen for an appointment in the last year OR does the patient have an upcoming appointment? Yes  Can we respond through MyChart? Yes  Agent: Please be advised that Rx refills may take up to 3 business days. We ask that you follow-up with your pharmacy.

## 2023-08-25 ENCOUNTER — Other Ambulatory Visit: Payer: Self-pay | Admitting: Family Medicine

## 2023-08-25 DIAGNOSIS — K2100 Gastro-esophageal reflux disease with esophagitis, without bleeding: Secondary | ICD-10-CM

## 2023-08-26 DIAGNOSIS — N185 Chronic kidney disease, stage 5: Secondary | ICD-10-CM | POA: Diagnosis not present

## 2023-08-26 DIAGNOSIS — D631 Anemia in chronic kidney disease: Secondary | ICD-10-CM | POA: Diagnosis not present

## 2023-08-26 DIAGNOSIS — I120 Hypertensive chronic kidney disease with stage 5 chronic kidney disease or end stage renal disease: Secondary | ICD-10-CM | POA: Diagnosis not present

## 2023-08-26 DIAGNOSIS — N2581 Secondary hyperparathyroidism of renal origin: Secondary | ICD-10-CM | POA: Diagnosis not present

## 2023-08-26 DIAGNOSIS — E872 Acidosis, unspecified: Secondary | ICD-10-CM | POA: Diagnosis not present

## 2023-08-26 DIAGNOSIS — E1122 Type 2 diabetes mellitus with diabetic chronic kidney disease: Secondary | ICD-10-CM | POA: Diagnosis not present

## 2023-08-29 ENCOUNTER — Ambulatory Visit: Payer: 59 | Admitting: Occupational Therapy

## 2023-08-29 ENCOUNTER — Encounter: Payer: Self-pay | Admitting: Physical Therapy

## 2023-08-29 ENCOUNTER — Ambulatory Visit: Payer: 59 | Admitting: Physical Therapy

## 2023-08-29 VITALS — BP 119/58 | HR 75

## 2023-08-29 DIAGNOSIS — M25632 Stiffness of left wrist, not elsewhere classified: Secondary | ICD-10-CM | POA: Diagnosis not present

## 2023-08-29 DIAGNOSIS — R278 Other lack of coordination: Secondary | ICD-10-CM | POA: Diagnosis not present

## 2023-08-29 DIAGNOSIS — R2681 Unsteadiness on feet: Secondary | ICD-10-CM

## 2023-08-29 DIAGNOSIS — M6281 Muscle weakness (generalized): Secondary | ICD-10-CM

## 2023-08-29 DIAGNOSIS — R2689 Other abnormalities of gait and mobility: Secondary | ICD-10-CM | POA: Diagnosis not present

## 2023-08-29 DIAGNOSIS — R262 Difficulty in walking, not elsewhere classified: Secondary | ICD-10-CM | POA: Diagnosis not present

## 2023-08-29 DIAGNOSIS — R29898 Other symptoms and signs involving the musculoskeletal system: Secondary | ICD-10-CM

## 2023-08-29 DIAGNOSIS — M25642 Stiffness of left hand, not elsewhere classified: Secondary | ICD-10-CM | POA: Diagnosis not present

## 2023-08-29 NOTE — Therapy (Signed)
  OUTPATIENT OCCUPATIONAL THERAPY NEURO Treatment  Patient Name: Tony Schmitt MRN: [id number] DOB:1971/06/20, 53 y.o., male Today's Date: 08/29/2023  This encounter was created in error.   Wynetta Emery, OT 08/29/2023, 1:57 PM

## 2023-08-29 NOTE — Therapy (Addendum)
OUTPATIENT PHYSICAL THERAPY NEURO TREATMENT   Patient Name: Tony Schmitt MRN: [id number] DOB:05-20-1971, 53 y.o., male Today's Date: 08/29/2023   PCP: Hoy Register, MD REFERRING PROVIDER: Hoy Register, MD  END OF SESSION:  PT End of Session - 08/29/23 1404     Visit Number 3    Number of Visits 9    Date for PT Re-Evaluation 10/28/23    Authorization Type U[employer] Dual    PT Start Time 1403    PT Stop Time 1445    PT Time Calculation (min) 42 min    Equipment Utilized During Treatment Gait belt    Activity Tolerance Patient limited by fatigue    Behavior During Therapy Flat affect   drowsy             Past Medical History:  Diagnosis Date   Anemia    Chronic kidney disease    Diabetes mellitus without complication (HCC)    GERD (gastroesophageal reflux disease)    Hypertension    Sleep apnea    Stroke [facility])    Past Surgical History:  Procedure Laterality Date   AV FISTULA PLACEMENT Right 04/30/2023   Procedure: INSERTION OF RIGHT UPPER ARM GORETEX  GRAFT;  Surgeon: Chuck Hint, MD;  Location: [facility];  Service: Vascular;  Laterality: Right;   BUBBLE STUDY  04/18/2020   Procedure: BUBBLE STUDY;  Surgeon: Quintella Reichert, MD;  Location: [facility] ENDOSCOPY;  Service: Cardiovascular;;   TEE WITHOUT CARDIOVERSION N/A 04/18/2020   Procedure: TRANSESOPHAGEAL ECHOCARDIOGRAM (TEE);  Surgeon: Quintella Reichert, MD;  Location: [facility] ENDOSCOPY;  Service: Cardiovascular;  Laterality: N/A;   Patient Active Problem List   Diagnosis Date Noted   Spastic hemiplegia, unspecified etiology, unspecified laterality (HCC) 01/07/2023   Acquired left foot drop 05/22/2022   Nephrotic syndrome    Peripheral edema    Abnormality of gait 05/23/2020   Type 2 diabetes mellitus with hyperglycemia, without long-term current use of insulin (HCC)    Thrombocytosis    Benign essential HTN    Acute blood loss anemia    Leukocytosis    Slow transit constipation    AKI (acute kidney injury)  (HCC)    Diabetes mellitus type 2 with complications, uncontrolled    CVA (cerebral vascular accident) (HCC) 04/22/2020   Acute left-sided weakness    Tobacco abuse    Essential hypertension    Acute CVA (cerebrovascular accident) (HCC) 04/16/2020   Hypertensive urgency 04/16/2020   Type 2 diabetes mellitus with vascular disease (HCC) 04/16/2020   CKD (chronic kidney disease) stage 3, GFR 30-59 ml/min (HCC) 04/23/2019   Hyperlipidemia 04/18/2019   Dyslipidemia associated with type 2 diabetes mellitus (HCC) 08/29/2018   Hypertension associated with type 2 diabetes mellitus (HCC) 08/29/2018    ONSET DATE: 07/18/2023  REFERRING DIAG: G81.10 (ICD-10-CM) - Spastic hemiplegia, unspecified etiology, unspecified laterality (HCC)  THERAPY DIAG:  Muscle weakness (generalized)  Other symptoms and signs involving the musculoskeletal system  Difficulty in walking, not elsewhere classified  Unsteadiness on feet  Other abnormalities of gait and mobility  Rationale for Evaluation and Treatment: Rehabilitation  SUBJECTIVE:  SUBJECTIVE STATEMENT: Patient reports that he is doing alright. He reports exercises are okay at home. Wanting to work on leg strength in today's session and denies falls and near falls. Patient reports that he has an AFO at home but does not wear it often.   Pt accompanied by: family member mom (in lobby)  PERTINENT HISTORY: CKD, DM, HTN, stroke (2021)  PAIN:  Are you having pain? No  PRECAUTIONS: Fall and Other: fistula on R arm  RED FLAGS: None   WEIGHT BEARING RESTRICTIONS: No  FALLS: Has patient fallen in last 6 months? No  LIVING ENVIRONMENT: Lives with: lives with their family Lives in: House/apartment Stairs: Yes: External: 4 steps; can reach both Has following  equipment at home: Single point cane, Walker - 2 wheeled, Wheelchair (manual), and shower chair  PLOF: Independent with gait, Independent with transfers, Requires assistive device for independence, and Needs assistance with ADLs  PATIENT GOALS: "get my hands to get a grip" "work towards being able to get a kidney"  OBJECTIVE:  Note: Objective measures were completed at Evaluation unless otherwise noted.  DIAGNOSTIC FINDINGS: None relevant to this POC  COGNITION: Overall cognitive status: Within functional limits for tasks assessed   SENSATION: Gets N/T in L hemibody every 3 months or so  EDEMA:  Swelling in L hand  POSTURE: rounded shoulders, forward head, and posterior pelvic tilt  LOWER EXTREMITY ROM:     Active  Right Eval Left Eval  Hip flexion    Hip extension    Hip abduction    Hip adduction    Hip internal rotation    Hip external rotation    Knee flexion    Knee extension    Ankle dorsiflexion    Ankle plantarflexion    Ankle inversion    Ankle eversion     (Blank rows = not tested)  LOWER EXTREMITY MMT:    MMT Right Eval Left Eval  Hip flexion 4 3  Hip extension    Hip abduction    Hip adduction    Hip internal rotation    Hip external rotation    Knee flexion 5 2-  Knee extension 5 2-  Ankle dorsiflexion 5 3  Ankle plantarflexion    Ankle inversion    Ankle eversion    (Blank rows = not tested)  BED MOBILITY:  Mod I per pt report, utilizes bedrail and has an adjustable bed  TRANSFERS: Assistive device utilized: None  Sit to stand: Modified independence Stand to sit: Modified independence Chair to chair: Modified independence  GAIT: Gait pattern: decreased arm swing- Left, decreased hip/knee flexion- Left, decreased ankle dorsiflexion- Left, circumduction- Left, lateral lean- Right, and abducted- Left Distance walked: various clinic distances Assistive device utilized: Single point cane Level of assistance: Modified  independence Comments: L hemiparesis  FUNCTIONAL TESTS:                                                                                                                 TREATMENT: 08/15/2023  Vitals:   08/29/23 1409 08/29/23 1435  BP: 117/62 (!) 119/58  Pulse: 72 75   Seated on LUE at rest   NMR:   Dynamic Warmup for neural priming with cardiovascular endurance training - 1 x 70 feet walking with SPC (SBA)  Gait pattern: decreased arm swing- Left, decreased hip/knee flexion- Left, decreased ankle dorsiflexion- Left, circumduction- Left, lateral lean- Right, and abducted- Left Assistive device utilized: Single point cane Level of assistance: Modified independence Comments: L hemiparesis, one instance of toe catching on first half lap  - SciFit level 6 x 5 minutes with target spm goal of > 80 with bilateral LE and RUE only  - patient able to maintain speed with min cues  - 1 x 70 feet walking with SPC (SBA)    Gait pattern: same as above without extra toe catch  TherAct:   5 Blaze pods on random setting for improved stance on LLE and flexion on LLE.  Performed on 1 minute intervals with 30 rest periods.  Pt requires CGA-minA guarding. Round 1:  patient stepping up to first step with LLE and stepping up to second step with RLE for stance on LLE setup.  13 hits. Round 2:  patient stepping up to first step with LLE and stepping up to second step with RLE for stance on LLE s setup.  Unknown, stopped halfway through hits.  On second round of blaze pod tasks, patient became slightly drowsy and took longer to respond to PT cues, PT sat patient down and assessed vitals with minor drop noted but not enough to be considered orthostatic, patient had not checked sugars that day and had lunch but not breakfast, patient nodding in and out of conversation, PT had second PT stay with patient and got patient's mom, patient alertness mildly improved at this time but recommend patient go home, assess  sugars, and call PCP if out of range recommend going into ED or if symptoms not improved, patient and patient's mother verbalize understanding, patient refused transport chair out of clinic but agreed to walking escort by PT and made it safely to car. Advised patient assess glucose prior to PT sessions and bring glucose monitor in future   Vitals:   08/29/23 1409 08/29/23 1435  BP: 117/62 (!) 119/58  Pulse: 72 75   Addend to include correct BP readings    PATIENT EDUCATION: Education details: See above  Person educated: Patient Education method: Medical illustrator Education comprehension: verbalized understanding, returned demonstration, and needs further education  HOME EXERCISE PROGRAM: Access Code: [email or web address] URL: [email or web address] Date: 08/15/2023 Prepared by: Camille Bal  Exercises - Staggered Sit-to-Stand  - 1 x daily - 7 x weekly - 3 sets - 8-10 reps - Side to Side Weight Shift with Counter Support  - 1 x daily - 7 x weekly - 3 sets - 10 reps - Staggered Stance Forward Backward Weight Shift with Counter Support  - 1 x daily - 7 x weekly - 3 sets - 10 reps  GOALS: Goals reviewed with patient? Yes  SHORT TERM GOALS: Target date: 09/02/2023  Pt will be independent with initial HEP for improved strength, balance, transfers and gait. Baseline: Goal status: INITIAL  2.  Pt will improve gait velocity to at least 1.75 ft/sec for improved gait efficiency and performance at mod I level  Baseline: 1.32 ft/sec mod I SPC (12/30) Goal status: INITIAL  3.  Pt will improve normal TUG to less than or equal to 19 seconds for improved  functional mobility and decreased fall risk. Baseline: 23.81 sec (12/30) Goal status: INITIAL  4.  Pt will improve Berg score to 39/56 for decreased fall risk Baseline: 34/56 (12/30) Goal status: INITIAL  LONG TERM GOALS: Target date: 10/03/2023  Pt will be independent with final HEP for improved strength, balance,  transfers and gait. Baseline:  Goal status: INITIAL  2.  Pt will improve gait velocity to at least 2.0 ft/sec for improved gait efficiency and performance at mod I level  Baseline: 1.32 ft/sec mod I SPC (12/30) Goal status: INITIAL  3.  Pt will improve normal TUG to less than or equal to 15 seconds for improved functional mobility and decreased fall risk. Baseline: 23.81 sec (12/30)  Goal status: INITIAL  4.  Pt will improve Berg score to 44/56 for decreased fall risk Baseline: 34/56 (12/30) Goal status: INITIAL    ASSESSMENT:  CLINICAL IMPRESSION: Emphasis of skilled session on attempting to continue NMR training; however, patient becomes drowsy and has difficulty maintaining attention during session. Patient symptoms mildly improve as noted in theract section but discontinued remainder of session and recommend to both patient and patient's mother that patient assess sugar, follow up with PCP, and go into ED if symptoms worsen or do not improve. Will continue per POC as able.   OBJECTIVE IMPAIRMENTS: Abnormal gait, decreased balance, decreased coordination, decreased mobility, difficulty walking, decreased ROM, decreased strength, increased muscle spasms, impaired sensation, impaired tone, impaired UE functional use, improper body mechanics, and postural dysfunction.   ACTIVITY LIMITATIONS: carrying, lifting, bending, stairs, transfers, dressing, and reach over head  PARTICIPATION LIMITATIONS: driving and community activity  PERSONAL FACTORS: Time since onset of injury/illness/exacerbation, Transportation, and 3+ comorbidities:    CKD, DM, HTN, strokeare also affecting patient's functional outcome.   REHAB POTENTIAL: Fair chronicity of CVA  CLINICAL DECISION MAKING: Stable/uncomplicated  EVALUATION COMPLEXITY: Low  PLAN:  PT FREQUENCY: 1x/week  PT DURATION: 8 weeks  PLANNED INTERVENTIONS: 97164- PT Re-evaluation, 97110-Therapeutic exercises, 97530- Therapeutic activity,  [id number]- Neuromuscular re-education, 97535- Self Care, [id number]- Manual therapy, [phone]- Gait training, [phone]- Orthotic Fit/training, [phone]- Aquatic Therapy, [phone]- Electrical stimulation (manual), Patient/Family education, Balance training, Stair training, Taping, Dry Needling, Joint mobilization, Spinal mobilization, DME instructions, Cryotherapy, and Moist heat  PLAN FOR NEXT SESSION:  initiate HEP to work on functional strengthening of LLE (tall kneel? Staggered sit to stands), powder board?  Left NMR, how is patient doing after last session, did he follow up with PCP and what are glucose levels, assess goals by 1/27   Carmelia Bake, PT, DPT  08/29/2023, 3:26 PM

## 2023-08-30 ENCOUNTER — Ambulatory Visit (HOSPITAL_COMMUNITY)
Admission: RE | Admit: 2023-08-30 | Discharge: 2023-08-30 | Disposition: A | Discharge status: Home / Self Care | Payer: 59 | Source: Ambulatory Visit | Attending: Nephrology

## 2023-08-30 VITALS — BP 117/60 | HR 69 | Temp 97.1°F | Resp 16

## 2023-08-30 DIAGNOSIS — N183 Chronic kidney disease, stage 3 unspecified: Secondary | ICD-10-CM | POA: Insufficient documentation

## 2023-08-30 LAB — CBC WITH DIFFERENTIAL/PLATELET
Abs Immature Granulocytes: 0.04 10*3/uL (ref 0.00–0.07)
Basophils Absolute: 0.1 10*3/uL (ref 0.0–0.1)
Basophils Relative: 1 %
Eosinophils Absolute: 0.2 10*3/uL (ref 0.0–0.5)
Eosinophils Relative: 2 %
HCT: 36.6 % — ABNORMAL LOW (ref 39.0–52.0)
Hemoglobin: 10.9 g/dL — ABNORMAL LOW (ref 13.0–17.0)
Immature Granulocytes: 1 %
Lymphocytes Relative: 30 %
Lymphs Abs: 2.7 10*3/uL (ref 0.7–4.0)
MCH: 27.2 pg (ref 26.0–34.0)
MCHC: 29.8 g/dL — ABNORMAL LOW (ref 30.0–36.0)
MCV: 91.3 fL (ref 80.0–100.0)
Monocytes Absolute: 0.6 10*3/uL (ref 0.1–1.0)
Monocytes Relative: 7 %
Neutro Abs: 5.3 10*3/uL (ref 1.7–7.7)
Neutrophils Relative %: 59 %
Platelets: 156 10*3/uL (ref 150–400)
RBC: 4.01 MIL/uL — ABNORMAL LOW (ref 4.22–5.81)
RDW: 15.2 % (ref 11.5–15.5)
WBC: 8.9 10*3/uL (ref 4.0–10.5)
nRBC: 0 % (ref 0.0–0.2)

## 2023-08-30 LAB — POCT HEMOGLOBIN-HEMACUE: Hemoglobin: 10.5 g/dL — ABNORMAL LOW (ref 13.0–17.0)

## 2023-08-30 LAB — RENAL FUNCTION PANEL
Albumin: 3.8 g/dL (ref 3.5–5.0)
Anion gap: 12 (ref 5–15)
BUN: 51 mg/dL — ABNORMAL HIGH (ref 6–20)
CO2: 17 mmol/L — ABNORMAL LOW (ref 22–32)
Calcium: 9.9 mg/dL (ref 8.9–10.3)
Chloride: 103 mmol/L (ref 98–111)
Creatinine, Ser: 6.63 mg/dL — ABNORMAL HIGH (ref 0.61–1.24)
GFR, Estimated: 9 mL/min — ABNORMAL LOW (ref >60)
Glucose, Bld: 176 mg/dL — ABNORMAL HIGH (ref 70–99)
Phosphorus: 3.7 mg/dL (ref 2.5–4.6)
Potassium: 5.3 mmol/L — ABNORMAL HIGH (ref 3.5–5.1)
Sodium: 132 mmol/L — ABNORMAL LOW (ref 135–145)

## 2023-08-30 MED ORDER — EPOETIN ALFA-EPBX 10000 UNIT/ML IJ SOLN
20000.0000 [IU] | INTRAMUSCULAR | Status: DC
  Administered 2023-08-30: 20000 [IU] via SUBCUTANEOUS

## 2023-08-30 MED ORDER — EPOETIN ALFA-EPBX 10000 UNIT/ML IJ SOLN
INTRAMUSCULAR | Status: AC
  Filled 2023-08-30: qty 2

## 2023-09-01 ENCOUNTER — Other Ambulatory Visit: Payer: Self-pay | Admitting: Family Medicine

## 2023-09-01 DIAGNOSIS — E1122 Type 2 diabetes mellitus with diabetic chronic kidney disease: Secondary | ICD-10-CM

## 2023-09-02 ENCOUNTER — Other Ambulatory Visit: Payer: Self-pay | Admitting: Family Medicine

## 2023-09-02 DIAGNOSIS — K2100 Gastro-esophageal reflux disease with esophagitis, without bleeding: Secondary | ICD-10-CM

## 2023-09-02 DIAGNOSIS — E1122 Type 2 diabetes mellitus with diabetic chronic kidney disease: Secondary | ICD-10-CM

## 2023-09-02 MED ORDER — OMEPRAZOLE 40 MG PO CPDR: 40.0000 mg | DELAYED_RELEASE_CAPSULE | Freq: Every day | ORAL | 0 refills | Status: DC

## 2023-09-02 MED ORDER — CARVEDILOL 25 MG PO TABS: 25.0000 mg | ORAL_TABLET | Freq: Two times a day (BID) | ORAL | 0 refills | Status: DC

## 2023-09-02 MED ORDER — ISOSORBIDE MONONITRATE ER 30 MG PO TB24: 30.0000 mg | ORAL_TABLET | Freq: Every day | ORAL | 1 refills | Status: DC

## 2023-09-02 NOTE — Telephone Encounter (Signed)
Copied from CRM [id number]. Topic: Clinical - Medication Refill >> Sep 02, 2023 10:56 AM Jorje Guild R wrote: Most Recent Primary Care Visit:  Provider: [facility]  Department: [facility]  Visit Type: LAB  Date: 01/11/2023  Medication:  carvedilol (COREG) 25 MG tablet isosorbide mononitrate (IMDUR) 30 MG 24 hr tablet omeprazole (PRILOSEC) 40 MG capsule  Has the patient contacted their pharmacy? Yes (Agent: If no, request that the patient contact the pharmacy for the refill. If patient does not wish to contact the pharmacy document the reason why and proceed with request.) (Agent: If yes, when and what did the pharmacy advise?)  Is this the correct pharmacy for this prescription? Yes If no, delete pharmacy and type the correct one.  This is the patient's preferred pharmacy:  [facility] [address] ([address]), [place] - [address] [place] (SE) [place] [address] Phone: [phone] Fax: [phone]   Has the prescription been filled recently? Yes  Is the patient out of the medication? Yes  Has the patient been seen for an appointment in the last year OR does the patient have an upcoming appointment? Yes  Can we respond through MyChart? Yes  Agent: Please be advised that Rx refills may take up to 3 business days. We ask that you follow-up with your pharmacy.

## 2023-09-03 NOTE — Telephone Encounter (Signed)
Requested by interface surescripts. Signed 09/02/23. Receipt confirmed by pharmacy 09/02/23 at 11:44 am. Duplicate request. Requested Prescriptions  Refused Prescriptions Disp Refills   carvedilol (COREG) 25 MG tablet [Pharmacy Med Name: Carvedilol 25 MG Oral Tablet] 60 tablet 0    Sig: TAKE 1 TABLET BY MOUTH TWICE DAILY WITH A MEAL     Cardiovascular: Beta Blockers 3 Failed - 09/03/2023  9:00 AM      Failed - Cr in normal range and within 360 days    Creatinine, Ser  Date Value Ref Range Status  08/30/2023 6.63 (H) 0.61 - 1.24 mg/dL Final   Creatinine, Urine  Date Value Ref Range Status  06/11/2020 87.72 mg/dL Final    Comment:    Performed at [facility], [address], [place], [place] [address]         Failed - AST in normal range and within 360 days    AST  Date Value Ref Range Status  02/07/2022 15 0 - 40 IU/L Final         Failed - ALT in normal range and within 360 days    ALT  Date Value Ref Range Status  02/07/2022 23 0 - 44 IU/L Final         Failed - Valid encounter within last 6 months    Recent Outpatient Visits           7 months ago Type 2 diabetes mellitus with other specified complication, without long-term current use of insulin (HCC)   [facility] - [facility] Dept Of [place]. [facility] Hoy Register, MD   1 year ago Type 2 diabetes mellitus with other specified complication, without long-term current use of insulin (HCC)   [facility] [place] - A Dept Of [place]. [facility] Hoy Register, MD   1 year ago Type 2 diabetes mellitus with other specified complication, without long-term current use of insulin (HCC)   [facility] [place] - A Dept Of [place]. [facility] Hoy Register, MD   1 year ago Hypertension in stage 4 chronic kidney disease due to type 2 diabetes mellitus [facility])   [facility] - A Dept Of [place]. [facility] Lois Huxley,  Eden L, RPH-CPP   1 year ago Hypertension in stage 4 chronic kidney disease due to type 2 diabetes mellitus [facility])   [facility] - A Dept Of [place]. [facility] Lois Huxley, Cornelius Moras, RPH-CPP       Future Appointments             In 2 weeks Hoy Register, MD [facility] - A Dept Of [place] Bridegroom. [facility]            Passed - Last BP in normal range    BP Readings from Last 1 Encounters:  08/30/23 117/60         Passed - Last Heart Rate in normal range    Pulse Readings from Last 1 Encounters:  08/30/23 69          isosorbide mononitrate (IMDUR) 30 MG 24 hr tablet [Pharmacy Med Name: Isosorbide Mononitrate ER 30 MG Oral Tablet Extended Release 24 Hour] 30 tablet 0    Sig: Take 1 tablet by mouth once daily     Cardiovascular:  Nitrates Passed - 09/03/2023  9:00 AM  Passed - Last BP in normal range    BP Readings from Last 1 Encounters:  08/30/23 117/60         Passed - Last Heart Rate in normal range    Pulse Readings from Last 1 Encounters:  08/30/23 69         Passed - Valid encounter within last 12 months    Recent Outpatient Visits           7 months ago Type 2 diabetes mellitus with other specified complication, without long-term current use of insulin (HCC)   [facility] - A Dept Of [place]. [facility] Hoy Register, MD   1 year ago Type 2 diabetes mellitus with other specified complication, without long-term current use of insulin (HCC)   [facility] [place] - A Dept Of [place]. [facility] Hoy Register, MD   1 year ago Type 2 diabetes mellitus with other specified complication, without long-term current use of insulin (HCC)   [facility] [place] - A Dept Of [place]. [facility] Hoy Register, MD   1 year ago Hypertension in stage 4 chronic kidney disease due to type 2 diabetes mellitus [facility])   [place]  [facility] - A Dept Of [place]. [facility] Lois Huxley, [place] L, RPH-CPP   1 year ago Hypertension in stage 4 chronic kidney disease due to type 2 diabetes mellitus [facility])    [facility] - A Dept Of . [facility] Lois Huxley, Cornelius Moras, RPH-CPP       Future Appointments             In 2 weeks Hoy Register, MD [facility] - A Dept Of [place] Brideg[place]. [facility]

## 2023-09-05 ENCOUNTER — Ambulatory Visit: Payer: 59 | Admitting: Occupational Therapy

## 2023-09-05 ENCOUNTER — Ambulatory Visit: Payer: 59 | Admitting: Physical Therapy

## 2023-09-05 VITALS — BP 123/60 | HR 74

## 2023-09-05 DIAGNOSIS — R262 Difficulty in walking, not elsewhere classified: Secondary | ICD-10-CM | POA: Diagnosis not present

## 2023-09-05 DIAGNOSIS — R278 Other lack of coordination: Secondary | ICD-10-CM | POA: Diagnosis not present

## 2023-09-05 DIAGNOSIS — R29898 Other symptoms and signs involving the musculoskeletal system: Secondary | ICD-10-CM

## 2023-09-05 DIAGNOSIS — M6281 Muscle weakness (generalized): Secondary | ICD-10-CM

## 2023-09-05 DIAGNOSIS — R2689 Other abnormalities of gait and mobility: Secondary | ICD-10-CM

## 2023-09-05 DIAGNOSIS — M25632 Stiffness of left wrist, not elsewhere classified: Secondary | ICD-10-CM | POA: Diagnosis not present

## 2023-09-05 DIAGNOSIS — M25642 Stiffness of left hand, not elsewhere classified: Secondary | ICD-10-CM | POA: Diagnosis not present

## 2023-09-05 DIAGNOSIS — R2681 Unsteadiness on feet: Secondary | ICD-10-CM | POA: Diagnosis not present

## 2023-09-05 NOTE — Therapy (Signed)
OUTPATIENT OCCUPATIONAL THERAPY NEURO EVALUATION  Patient Name: Tony Schmitt MRN: [id number] DOB:1970-10-09, 53 y.o., male Today's Date: 09/05/2023  PCP: Hoy Register, MD REFERRING PROVIDER: Hoy Register, MD   END OF SESSION:  OT End of Session - 09/05/23 1324     Visit Number 1    Number of Visits 9    Date for OT Re-Evaluation 10/11/23    Authorization Type [employer] - requires auth    Progress Note Due on Visit 9    OT Start Time 1320    OT Stop Time 1400    OT Time Calculation (min) 40 min    Activity Tolerance Patient tolerated treatment well    Behavior During Therapy WFL for tasks assessed/performed             Past Medical History:  Diagnosis Date   Anemia    Chronic kidney disease    Diabetes mellitus without complication (HCC)    GERD (gastroesophageal reflux disease)    Hypertension    Sleep apnea    Stroke [facility])    Past Surgical History:  Procedure Laterality Date   AV FISTULA PLACEMENT Right 04/30/2023   Procedure: INSERTION OF RIGHT UPPER ARM GORETEX  GRAFT;  Surgeon: Chuck Hint, MD;  Location: [facility] OR;  Service: Vascular;  Laterality: Right;   BUBBLE STUDY  04/18/2020   Procedure: BUBBLE STUDY;  Surgeon: Quintella Reichert, MD;  Location: [facility] ENDOSCOPY;  Service: Cardiovascular;;   TEE WITHOUT CARDIOVERSION N/A 04/18/2020   Procedure: TRANSESOPHAGEAL ECHOCARDIOGRAM (TEE);  Surgeon: Quintella Reichert, MD;  Location: [facility] ENDOSCOPY;  Service: Cardiovascular;  Laterality: N/A;   Patient Active Problem List   Diagnosis Date Noted   Spastic hemiplegia, unspecified etiology, unspecified laterality (HCC) 01/07/2023   Acquired left foot drop 05/22/2022   Nephrotic syndrome    Peripheral edema    Abnormality of gait 05/23/2020   Type 2 diabetes mellitus with hyperglycemia, without long-term current use of insulin (HCC)    Thrombocytosis    Benign essential HTN    Acute blood loss anemia    Leukocytosis    Slow transit constipation    AKI  (acute kidney injury) (HCC)    Diabetes mellitus type 2 with complications, uncontrolled    CVA (cerebral vascular accident) (HCC) 04/22/2020   Acute left-sided weakness    Tobacco abuse    Essential hypertension    Acute CVA (cerebrovascular accident) (HCC) 04/16/2020   Hypertensive urgency 04/16/2020   Type 2 diabetes mellitus with vascular disease (HCC) 04/16/2020   CKD (chronic kidney disease) stage 3, GFR 30-59 ml/min (HCC) 04/23/2019   Hyperlipidemia 04/18/2019   Dyslipidemia associated with type 2 diabetes mellitus (HCC) 08/29/2018   Hypertension associated with type 2 diabetes mellitus (HCC) 08/29/2018    ONSET DATE: 08/06/2023 (Date of referral)  REFERRING DIAG: M62.81 (ICD-10-CM) - Muscle weakness (generalized)   THERAPY DIAG:  No diagnosis found.  Rationale for Evaluation and Treatment: Rehabilitation  SUBJECTIVE:   SUBJECTIVE STATEMENT: He has a resting hand splint but forgot to bring it.  He asked if there was a program where he could get therapy 3+ hours/day because he finds [place] "motivating" to him.  Pt accompanied by: self  PERTINENT HISTORY: CKD, DM, HTN, infarct stroke (2021)  Reason for Referral: Left upper extremity weakness   PRECAUTIONS: Fall and Other: fistula on R arm; latex allergy   WEIGHT BEARING RESTRICTIONS: No  PAIN:  Are you having pain? No  FALLS: Has patient fallen in last 6  months? No  LIVING ENVIRONMENT: Lives with: lives with their family Lives in: House/apartment Stairs: Yes: External: 4 steps; can reach both Has following equipment at home: Single point cane, Walker - 2 wheeled, Wheelchair (manual), and shower chair   PLOF: Independent with gait, Independent with transfers, Requires assistive device for independence, and Needs assistance with ADLs  PATIENT GOALS: "get my hands to get a grip"   OBJECTIVE:  Note: Objective measures were completed at Evaluation unless otherwise noted.  HAND DOMINANCE: Right  ADLs: Eating:  assistance to cut food Grooming: mod I UB Dressing: mod I LB Dressing: mod I Toileting: mod I Bathing: mod I  Equipment: Shower seat with back and Long handled sponge  IADLs: Shopping: mod I Light housekeeping: only cleans bathroom, no assistance Meal Prep: light meal prep mod I Community mobility: dependent Medication management: dependent Financial management: dependent Handwriting:  no change  MOBILITY STATUS: Needs Assist: SPC  ACTIVITY TOLERANCE: Activity tolerance: good to fair  FUNCTIONAL OUTCOME MEASURES: Quick Dash: 38.6 %   UPPER EXTREMITY ROM:     AROM Right (eval) Left (eval)  Shoulder flexion WNL 80  Shoulder abduction WNL 70  Elbow flexion WNL 90  Elbow extension WNL -30  Wrist flexion WNL slight  Wrist extension WNL slight  Wrist pronation WNL lacks active motion  Wrist supination WNL lacks active motion   Digit Composite Flexion WNL lacks active motion  Digit Composite Extension WNL lacks active motion  Digit Opposition WNL lacks active motion  (Blank rows = not tested)  LUE ER just above shoulder; IR just in front of hip  HAND FUNCTION: Lacks active motion  COORDINATION: lacks active motion  SENSATION: WFL  EDEMA: mild edema to L hand  MUSCLE TONE: LUE: Moderate  COGNITION: Overall cognitive status: History of cognitive impairments - at baseline  VISION: Subjective report: no change Baseline vision: Wears glasses all the time and Wears glasses for reading only Visual history:  n/a  PERCEPTION: Not tested  PRAXIS: Not tested  OBSERVATIONS: Pt appears well kept. Significant spasticity to LUE with minimal AROM. SPC for ambulation.                                                                                                                           TREATMENT DATE:    Neuromuscular reeducation provided, including specific visual, tactile and verbal cues and physical assistance to stimulate the neuromuscular system and promote  functional ROM, movement and use of L UE for activities such as grasp and release activities, elbow flexion/extension and appropriate positioning for nail care.  Pt able to assist with stretching and holding fingers to allow OTR to trim extremely long nails on L side with extra effort to supinate forearm to reach thumb.  The following motions were incorporated into an HEP printed for pt from [employer]:  - Seated Shoulder Shrugs  - pt encouraged to watch in the bathroom mirror to shrug shoulders as equally as possible - Seated  Scapular Retraction  - pt engaged in pinching shoulder blades back and down with minimal scapular instability noted.  - Seated Elbow Flexion and Extension AROM  - pt encouraged to wok on isolating elbow motions and minimizing compensatory trunk/shoulder motions - Seated Wrist Supination Stretch  - pt assisted with positioning R thumb on thenar eminence to help with improved stretch into supination - Wrist Extension Stretch Pronated  - pt assisted to work this stretch with hand at edge of table to flatten digits and then work on stretching wrist  - Hand PROM Finger Extension  - pt encouraged to work on flattening digits at PIP joints and counting to 5 for each repetition for a good stretch  Patient benefited from extra time, verbal/tactile cues, and modeling of task to allow time for processing of verbal instructions and improve motor planning of unfamiliar movements.   PATIENT EDUCATION: Education details: OT goals and Initial UE HEP Person educated: Patient Education method: Explanation, Demonstration, Tactile cues, Verbal cues, and Handouts Education comprehension: verbalized understanding, returned demonstration, verbal cues required, tactile cues required, and needs further education  HOME EXERCISE PROGRAM: 09/05/23 - Initial UE HEP/stretches - Access Code: [email or web address]  GOALS:  SHORT TERM GOALS: Target date: 09/12/2023    Pt will independently recall at least 3 ways to  manage LUE spasticity. Baseline: Goal status: IN Progress  2.  Pt will demonstrate volitional use of LUE (I.e. for stabilization) at least 25% of the time. Baseline: 0% - required cues, holds LUE in guarded position Goal status: IN Progress  LONG TERM GOALS: Target date: 10/11/2023  Patient will demonstrate updated LUE HEP with 25% verbal cues or less for proper execution. Baseline:  Goal status: INITIAL  2.  Pt will report no more than moderate difficulty cutting food using adaptive strategies/AD as needed. Baseline: unable Goal status: INITIAL   ASSESSMENT:  CLINICAL IMPRESSION: Patient is a 53 y.o. male who was seen today for occupational therapy treatment for LUE weakness s/p infarct stroke (2021). Patient was given initial UE HEP today to work on LUE ROM, stretching and positioning.  He is below baseline level of function with LUE due to posturing in flexion, difficulty with opening digits and stiffness in distal UE joints.  Pt will benefit from continued skilled OT services in the outpatient setting to work on impairments as noted at evaluation to help pt return to [facility] as able.    PERFORMANCE DEFICITS: in functional skills including ADLs, IADLs, coordination, tone, ROM, strength, Fine motor control, Gross motor control, decreased knowledge of use of DME, and UE functional use.   IMPAIRMENTS: are limiting patient from ADLs, IADLs, leisure, and social participation.   CO-MORBIDITIES: may have co-morbidities  that affects occupational performance. Patient will benefit from skilled OT to address above impairments and improve overall function.  REHAB POTENTIAL:  Fair chronicity of CVA   PLAN:  OT FREQUENCY: 1x/week  OT DURATION: 8 weeks  PLANNED INTERVENTIONS: 97168 OT Re-evaluation, 97535 self care/ADL training, 95188 therapeutic exercise, 97530 therapeutic activity, 97140 manual therapy, 97035 ultrasound, [id number] electrical stimulation (manual), [id number] Orthotics management and  training, 41660 Splinting (initial encounter), [id number] Subsequent splinting/medication, passive range of motion, functional mobility training, patient/family education, and DME and/or AE instructions  RECOMMENDED OTHER SERVICES: N/A for this visit  CONSULTED AND AGREED WITH PLAN OF CARE: Patient  PLAN FOR NEXT SESSION:  Progress UE ROM HEP NMES Assess/adjust splint as needed (would wrist cock up splint help with use of L hand?)   Marylu Lund L  Judie Grieve, OT 09/05/2023, 1:26 PM

## 2023-09-05 NOTE — Therapy (Deleted)
 OUTPATIENT OCCUPATIONAL THERAPY NEURO TREATMENT  Patient Name: Tony Schmitt MRN: [id number] DOB:[birth year], 53 y.o., male Today's Date: 09/05/2023  PCP: Hoy Register, MD REFERRING PROVIDER: Hoy Register, MD   END OF SESSION:    Past Medical History:  Diagnosis Date   Anemia    Chronic kidney disease    Diabetes mellitus without complication (HCC)    GERD (gastroesophageal reflux disease)    Hypertension    Sleep apnea    Stroke [facility])    Past Surgical History:  Procedure Laterality Date   AV FISTULA PLACEMENT Right 04/30/2023   Procedure: INSERTION OF RIGHT UPPER ARM GORETEX  GRAFT;  Surgeon: Chuck Hint, MD;  Location: [facility] OR;  Service: Vascular;  Laterality: Right;   BUBBLE STUDY  04/18/2020   Procedure: BUBBLE STUDY;  Surgeon: Quintella Reichert, MD;  Location: [facility] ENDOSCOPY;  Service: Cardiovascular;;   TEE WITHOUT CARDIOVERSION N/A 04/18/2020   Procedure: TRANSESOPHAGEAL ECHOCARDIOGRAM (TEE);  Surgeon: Quintella Reichert, MD;  Location: [facility] ENDOSCOPY;  Service: Cardiovascular;  Laterality: N/A;   Patient Active Problem List   Diagnosis Date Noted   Spastic hemiplegia, unspecified etiology, unspecified laterality (HCC) 01/07/2023   Acquired left foot drop 05/22/2022   Nephrotic syndrome    Peripheral edema    Abnormality of gait 05/23/2020   Type 2 diabetes mellitus with hyperglycemia, without long-term current use of insulin (HCC)    Thrombocytosis    Benign essential HTN    Acute blood loss anemia    Leukocytosis    Slow transit constipation    AKI (acute kidney injury) (HCC)    Diabetes mellitus type 2 with complications, uncontrolled    CVA (cerebral vascular accident) (HCC) 04/22/2020   Acute left-sided weakness    Tobacco abuse    Essential hypertension    Acute CVA (cerebrovascular accident) (HCC) 04/16/2020   Hypertensive urgency 04/16/2020   Type 2 diabetes mellitus with vascular disease (HCC) 04/16/2020   CKD (chronic kidney disease) stage 3,  GFR 30-59 ml/min (HCC) 04/23/2019   Hyperlipidemia 04/18/2019   Dyslipidemia associated with type 2 diabetes mellitus (HCC) 08/29/2018   Hypertension associated with type 2 diabetes mellitus (HCC) 08/29/2018    ONSET DATE: 08/06/2023 (Date of referral)  REFERRING DIAG: M62.81 (ICD-10-CM) - Muscle weakness (generalized)   THERAPY DIAG:  No diagnosis found.  Rationale for Evaluation and Treatment: Rehabilitation  SUBJECTIVE:   SUBJECTIVE STATEMENT: He has not had formal OT since his stroke. He did go to [facility] for therapy immediately after his stroke. They did use NMES, but he does not have a unit at home. He has resting hand splint but hasn't been wearing recently.  Pt accompanied by: self  PERTINENT HISTORY: CKD, DM, HTN, infarct stroke (2021)  Reason for Referral: Left upper extremity weakness   PRECAUTIONS: Fall and Other: fistula on R arm; latex allergy   WEIGHT BEARING RESTRICTIONS: No  PAIN:  Are you having pain? No  FALLS: Has patient fallen in last 6 months? No  LIVING ENVIRONMENT: Lives with: lives with their family Lives in: House/apartment Stairs: Yes: External: 4 steps; can reach both Has following equipment at home: Single point cane, Walker - 2 wheeled, Wheelchair (manual), and shower chair   PLOF: Independent with gait, Independent with transfers, Requires assistive device for independence, and Needs assistance with ADLs  PATIENT GOALS: "get my hands to get a grip"   OBJECTIVE:  Note: Objective measures were completed at Evaluation unless otherwise noted.  HAND DOMINANCE: Right  ADLs: Eating: assistance  to cut food Grooming: mod I UB Dressing: mod I LB Dressing: mod I Toileting: mod I Bathing: mod I  Equipment: Shower seat with back and Long handled sponge  IADLs: Shopping: mod I Light housekeeping: only cleans bathroom, no assistance Meal Prep: light meal prep mod I Community mobility: dependent Medication management: dependent Financial  management: dependent Handwriting:  no change  MOBILITY STATUS: Needs Assist: SPC  ACTIVITY TOLERANCE: Activity tolerance: good to fair  FUNCTIONAL OUTCOME MEASURES: Quick Dash: 38.6 %   UPPER EXTREMITY ROM:     AROM Right (eval) Left (eval)  Shoulder flexion WNL 80  Shoulder abduction WNL 70  Elbow flexion WNL 90  Elbow extension WNL -30  Wrist flexion WNL slight  Wrist extension WNL slight  Wrist pronation WNL lacks active motion  Wrist supination WNL lacks active motion   Digit Composite Flexion WNL lacks active motion  Digit Composite Extension WNL lacks active motion  Digit Opposition WNL lacks active motion  (Blank rows = not tested)  LUE ER just above shoulder; IR just in front of hip  HAND FUNCTION: Lacks active motion  COORDINATION: lacks active motion  SENSATION: WFL  EDEMA: mild edema to L hand  MUSCLE TONE: LUE: Moderate  COGNITION: Overall cognitive status: History of cognitive impairments - at baseline  VISION: Subjective report: no change Baseline vision: Wears glasses all the time and Wears glasses for reading only Visual history:  n/a  PERCEPTION: Not tested  PRAXIS: Not tested  OBSERVATIONS: Pt appears well kept. Significant spasticity to LUE with minimal AROM. SPC for ambulation.                                                                                                                           TREATMENT DATE:    N/A for this visit  PATIENT EDUCATION: Education details: OT Role and POC Person educated: Patient Education method: Explanation Education comprehension: verbalized understanding  HOME EXERCISE PROGRAM: N/A for this visit  GOALS:  SHORT TERM GOALS: Target date: 09/12/2023    Pt will independently recall at least 3 ways to manage LUE spasticity. Baseline: Goal status: INITIAL  2.  Pt will demonstrate volitional use of LUE (I.e. for stabilization) at least 25% of the time. Baseline: 0% - required cues,  holds LUE in guarded position Goal status: INITIAL  LONG TERM GOALS: Target date: 10/11/2023  Patient will demonstrate updated LUE HEP with 25% verbal cues or less for proper execution. Baseline:  Goal status: INITIAL  2.  Pt will report no more than moderate difficulty cutting food using adaptive strategies/AD as needed. Baseline: unable Goal status: INITIAL   ASSESSMENT:  CLINICAL IMPRESSION: Patient is a 53 y.o. male who was seen today for occupational therapy evaluation for LUE weakness. Hx includes CKD, DM, HTN, infarct stroke (2021). Patient currently presents below baseline level of functioning demonstrating functional deficits and impairments as noted below. Pt would benefit from skilled OT services in the outpatient setting to work  on impairments as noted below to help pt return to [facility] as able.    PERFORMANCE DEFICITS: in functional skills including ADLs, IADLs, coordination, tone, ROM, strength, Fine motor control, Gross motor control, decreased knowledge of use of DME, and UE functional use.   IMPAIRMENTS: are limiting patient from ADLs, IADLs, leisure, and social participation.   CO-MORBIDITIES: may have co-morbidities  that affects occupational performance. Patient will benefit from skilled OT to address above impairments and improve overall function.  REHAB POTENTIAL:  Fair chronicity of CVA   PLAN:  OT FREQUENCY: 1x/week  OT DURATION: 8 weeks  PLANNED INTERVENTIONS: 97168 OT Re-evaluation, 97535 self care/ADL training, 96295 therapeutic exercise, 97530 therapeutic activity, 97140 manual therapy, 97035 ultrasound, [id number] electrical stimulation (manual), [id number] Orthotics management and training, 28413 Splinting (initial encounter), [id number] Subsequent splinting/medication, passive range of motion, functional mobility training, patient/family education, and DME and/or AE instructions  RECOMMENDED OTHER SERVICES: N/A for this visit  CONSULTED AND AGREED WITH PLAN OF CARE:  Patient  PLAN FOR NEXT SESSION: ROM HEP; NMES; adjust splint as needed   Delana Meyer, OT 09/05/2023, 9:27 AM

## 2023-09-05 NOTE — Therapy (Signed)
OUTPATIENT PHYSICAL THERAPY NEURO TREATMENT   Patient Name: Tony Schmitt MRN: [id number] DOB:18-Oct-1970, 53 y.o., male Today's Date: 09/05/2023   PCP: Hoy Register, MD REFERRING PROVIDER: Hoy Register, MD  END OF SESSION:  PT End of Session - 09/05/23 1404     Visit Number 4    Number of Visits 9    Date for PT Re-Evaluation 10/28/23    Authorization Type U[employer] Dual    PT Start Time 1404   from OT session   PT Stop Time 1445    PT Time Calculation (min) 41 min    Equipment Utilized During Treatment Gait belt    Activity Tolerance Patient tolerated treatment well    Behavior During Therapy Flat affect               Past Medical History:  Diagnosis Date   Anemia    Chronic kidney disease    Diabetes mellitus without complication (HCC)    GERD (gastroesophageal reflux disease)    Hypertension    Sleep apnea    Stroke [facility])    Past Surgical History:  Procedure Laterality Date   AV FISTULA PLACEMENT Right 04/30/2023   Procedure: INSERTION OF RIGHT UPPER ARM GORETEX  GRAFT;  Surgeon: Chuck Hint, MD;  Location: [facility] OR;  Service: Vascular;  Laterality: Right;   BUBBLE STUDY  04/18/2020   Procedure: BUBBLE STUDY;  Surgeon: Quintella Reichert, MD;  Location: [facility] ENDOSCOPY;  Service: Cardiovascular;;   TEE WITHOUT CARDIOVERSION N/A 04/18/2020   Procedure: TRANSESOPHAGEAL ECHOCARDIOGRAM (TEE);  Surgeon: Quintella Reichert, MD;  Location: [facility] ENDOSCOPY;  Service: Cardiovascular;  Laterality: N/A;   Patient Active Problem List   Diagnosis Date Noted   Spastic hemiplegia, unspecified etiology, unspecified laterality (HCC) 01/07/2023   Acquired left foot drop 05/22/2022   Nephrotic syndrome    Peripheral edema    Abnormality of gait 05/23/2020   Type 2 diabetes mellitus with hyperglycemia, without long-term current use of insulin (HCC)    Thrombocytosis    Benign essential HTN    Acute blood loss anemia    Leukocytosis    Slow transit constipation    AKI (acute  kidney injury) (HCC)    Diabetes mellitus type 2 with complications, uncontrolled    CVA (cerebral vascular accident) (HCC) 04/22/2020   Acute left-sided weakness    Tobacco abuse    Essential hypertension    Acute CVA (cerebrovascular accident) (HCC) 04/16/2020   Hypertensive urgency 04/16/2020   Type 2 diabetes mellitus with vascular disease (HCC) 04/16/2020   CKD (chronic kidney disease) stage 3, GFR 30-59 ml/min (HCC) 04/23/2019   Hyperlipidemia 04/18/2019   Dyslipidemia associated with type 2 diabetes mellitus (HCC) 08/29/2018   Hypertension associated with type 2 diabetes mellitus (HCC) 08/29/2018    ONSET DATE: 07/18/2023  REFERRING DIAG: G81.10 (ICD-10-CM) - Spastic hemiplegia, unspecified etiology, unspecified laterality (HCC)  THERAPY DIAG:  Muscle weakness (generalized)  Other symptoms and signs involving the musculoskeletal system  Difficulty in walking, not elsewhere classified  Unsteadiness on feet  Other abnormalities of gait and mobility  Rationale for Evaluation and Treatment: Rehabilitation  SUBJECTIVE:  SUBJECTIVE STATEMENT: Pt reports he was feeling "tired" last session, feeling better today. Pt reports that his blood sugar is "good" today, does not give a number. Pt reports.  Pt accompanied by: family member mom (in lobby)  PERTINENT HISTORY: CKD, DM, HTN, stroke (2021)  PAIN:  Are you having pain? No  PRECAUTIONS: Fall and Other: fistula on R arm  RED FLAGS: None   WEIGHT BEARING RESTRICTIONS: No  FALLS: Has patient fallen in last 6 months? No  LIVING ENVIRONMENT: Lives with: lives with their family Lives in: House/apartment Stairs: Yes: External: 4 steps; can reach both Has following equipment at home: Single point cane, Walker - 2 wheeled, Wheelchair  (manual), and shower chair  PLOF: Independent with gait, Independent with transfers, Requires assistive device for independence, and Needs assistance with ADLs  PATIENT GOALS: "get my hands to get a grip" "work towards being able to get a kidney"  OBJECTIVE:  Note: Objective measures were completed at Evaluation unless otherwise noted.  DIAGNOSTIC FINDINGS: None relevant to this POC  COGNITION: Overall cognitive status: Within functional limits for tasks assessed   SENSATION: Gets N/T in L hemibody every 3 months or so  EDEMA:  Swelling in L hand  POSTURE: rounded shoulders, forward head, and posterior pelvic tilt  LOWER EXTREMITY ROM:     Active  Right Eval Left Eval  Hip flexion    Hip extension    Hip abduction    Hip adduction    Hip internal rotation    Hip external rotation    Knee flexion    Knee extension    Ankle dorsiflexion    Ankle plantarflexion    Ankle inversion    Ankle eversion     (Blank rows = not tested)  LOWER EXTREMITY MMT:    MMT Right Eval Left Eval  Hip flexion 4 3  Hip extension    Hip abduction    Hip adduction    Hip internal rotation    Hip external rotation    Knee flexion 5 2-  Knee extension 5 2-  Ankle dorsiflexion 5 3  Ankle plantarflexion    Ankle inversion    Ankle eversion    (Blank rows = not tested)  BED MOBILITY:  Mod I per pt report, utilizes bedrail and has an adjustable bed  TRANSFERS: Assistive device utilized: None  Sit to stand: Modified independence Stand to sit: Modified independence Chair to chair: Modified independence  GAIT: Gait pattern: decreased arm swing- Left, decreased hip/knee flexion- Left, decreased ankle dorsiflexion- Left, circumduction- Left, lateral lean- Right, and abducted- Left Distance walked: various clinic distances Assistive device utilized: Single point cane Level of assistance: Modified independence Comments: L hemiparesis                                                                              TREATMENT:  Vitals:   09/05/23 1407 09/05/23 1411  BP: (!) 142/74 123/60  Pulse: 76 74  Seated on LUE at rest. Pt talking during initial reading so re-assessed with him sitting quietly and value more WNL obtained.  TherEx: Review of previously prescribed HEP: Staggered sit to stands x 10 reps Staggered forwards/backwards weight shift at countertop  x 10 reps each direction Standing lateral weight shift at countertop x 10 reps each direction Mod manual cueing for forwards/backwards and lateral weight shifting to ensure full weight shift each direction  Trial of other therex to address ongoing LE weakness and muscle imbalances: Supine bridges x 10 reps, cues for slow, controlled movements   TherAct:  For STG assessment:  [facility] PT Assessment - 09/05/23 1418       Ambulation/Gait   Gait velocity 32.8 ft over 22.6 sec = 1.45 ft/sec      Standardized Balance Assessment   Standardized Balance Assessment Timed Up and Go Test      Berg Balance Test   Sit to Stand Able to stand without using hands and stabilize independently    Standing Unsupported Able to stand safely 2 minutes    Sitting with Back Unsupported but Feet Supported on Floor or Stool Able to sit safely and securely 2 minutes    Stand to Sit Sits safely with minimal use of hands    Transfers Able to transfer safely, minor use of hands    Standing Unsupported with Eyes Closed Able to stand 10 seconds safely    Standing Unsupported with Feet Together Able to place feet together independently and stand 1 minute safely    From Standing, Reach Forward with Outstretched Arm Can reach forward >12 cm safely (5")    From Standing Position, Pick up Object from Floor Able to pick up shoe, needs supervision    From Standing Position, Turn to Look Behind Over each Shoulder Looks behind one side only/other side shows less weight shift    Turn 360 Degrees Able to turn 360 degrees safely one side only in 4  seconds or less    Standing Unsupported, Alternately Place Feet on Step/Stool Able to complete >2 steps/needs minimal assist    Standing Unsupported, One Foot in Front Able to take small step independently and hold 30 seconds    Standing on One Leg Able to lift leg independently and hold equal to or more than 3 seconds    Total Score 45    Berg comment: 45/56, significant fall risk      Timed Up and Go Test   TUG Normal TUG    Normal TUG (seconds) 23.15   no AD            PATIENT EDUCATION: Education details: results of OM and functional implications, review of HEP Person educated: Patient Education method: Explanation, Demonstration, Tactile cues, and Verbal cues Education comprehension: verbalized understanding, returned demonstration, tactile cues required, and needs further education  HOME EXERCISE PROGRAM: Access Code: [email or web address] URL: [email or web address] Date: 08/15/2023 Prepared by: Camille Bal  Exercises - Staggered Sit-to-Stand  - 1 x daily - 7 x weekly - 3 sets - 8-10 reps - Side to Side Weight Shift with Counter Support  - 1 x daily - 7 x weekly - 3 sets - 10 reps - Staggered Stance Forward Backward Weight Shift with Counter Support  - 1 x daily - 7 x weekly - 3 sets - 10 reps   GOALS: Goals reviewed with patient? Yes  SHORT TERM GOALS: Target date: 09/02/2023  Pt will be independent with initial HEP for improved strength, balance, transfers and gait. Baseline: reviewed 1/30 Goal status: IN PROGRESS  2.  Pt will improve gait velocity to at least 1.75 ft/sec for improved gait efficiency and performance at mod I level  Baseline: 1.32 ft/sec mod I SPC (12/30), 1.45 ft/sec mod  I no AD (1/30) Goal status: IN PROGRESS  3.  Pt will improve normal TUG to less than or equal to 19 seconds for improved functional mobility and decreased fall risk. Baseline: 23.81 sec (12/30), 23.15 sec (1/30) Goal status: IN PROGRESS  4.  Pt will improve Berg score  to 39/56 for decreased fall risk Baseline: 34/56 (12/30), 45/56 (1/30) Goal status: MET  LONG TERM GOALS: Target date: 10/03/2023  Pt will be independent with final HEP for improved strength, balance, transfers and gait. Baseline:  Goal status: INITIAL  2.  Pt will improve gait velocity to at least 1.75 ft/sec for improved gait efficiency and performance at mod I level  Baseline: 1.32 ft/sec mod I SPC (12/30), 1.45 ft/sec mod I no AD (1/30) Goal status: REVISED  3.  Pt will improve normal TUG to less than or equal to 19 seconds for improved functional mobility and decreased fall risk. Baseline: 23.81 sec (12/30), 23.15 sec (1/30)  Goal status: REVISED  4.  Pt will improve Berg score to 44/56 for decreased fall risk Baseline: 34/56 (12/30), 45/56 (1/30) Goal status: MET    ASSESSMENT:  CLINICAL IMPRESSION: Emphasis of skilled PT session on assessing STG and reviewing prescribed HEP. Pt has met 1/4 STG due to improving his score on the Berg from 34/56 to 45/56, indicated a decreased fall risk. He is making progress towards remaining 3/4 STG. Pt continues to benefit from skilled PT services to work towards improving his L hemibody strength, reducing gait deviations, and increasing his safety and independence with functional mobility. Continue POC.   OBJECTIVE IMPAIRMENTS: Abnormal gait, decreased balance, decreased coordination, decreased mobility, difficulty walking, decreased ROM, decreased strength, increased muscle spasms, impaired sensation, impaired tone, impaired UE functional use, improper body mechanics, and postural dysfunction.   ACTIVITY LIMITATIONS: carrying, lifting, bending, stairs, transfers, dressing, and reach over head  PARTICIPATION LIMITATIONS: driving and community activity  PERSONAL FACTORS: Time since onset of injury/illness/exacerbation, Transportation, and 3+ comorbidities:    CKD, DM, HTN, strokeare also affecting patient's functional outcome.   REHAB  POTENTIAL: Fair chronicity of CVA  CLINICAL DECISION MAKING: Stable/uncomplicated  EVALUATION COMPLEXITY: Low  PLAN:  PT FREQUENCY: 1x/week  PT DURATION: 8 weeks  PLANNED INTERVENTIONS: 97164- PT Re-evaluation, 97110-Therapeutic exercises, 97530- Therapeutic activity, [id number]- Neuromuscular re-education, 97535- Self Care, 95284- Manual therapy, [phone]- Gait training, [phone]- Orthotic Fit/training, [phone]- Aquatic Therapy, [phone]- Electrical stimulation (manual), Patient/Family education, Balance training, Stair training, Taping, Dry Needling, Joint mobilization, Spinal mobilization, DME instructions, Cryotherapy, and Moist heat  PLAN FOR NEXT SESSION: add to HEP to work on functional strengthening of LLE (tall kneel? Staggered stance tasks), powder board?  Left NMR, what are glucose levels, assess BP   Peter Congo, PT Peter Congo, PT, DPT, CSRS   09/05/2023, 2:49 PM

## 2023-09-05 NOTE — Patient Instructions (Signed)
Access Code: [email or web address] URL: [email or web address] Date: 09/05/2023 Prepared by: Amada Kingfisher  Exercises - Seated Shoulder Shrugs  - 1 x daily - 7 x weekly - 3 sets - 10 reps - Seated Scapular Retraction  - 1 x daily - 7 x weekly - 3 sets - 10 reps - Seated Elbow Flexion and Extension AROM  - 1 x daily - 7 x weekly - 3 sets - 10 reps - Seated Wrist Supination Stretch  - 1 x daily - 7 x weekly - 3 sets - 10 reps - Wrist Extension Stretch Pronated  - 1 x daily - 7 x weekly - 3 sets - 10 reps - Hand PROM Finger Extension  - 1 x daily - 7 x weekly - 3 sets - 10 reps

## 2023-09-12 ENCOUNTER — Ambulatory Visit: Payer: 59 | Admitting: Occupational Therapy

## 2023-09-12 ENCOUNTER — Encounter: Payer: Self-pay | Admitting: Physical Therapy

## 2023-09-12 ENCOUNTER — Ambulatory Visit: Payer: 59 | Attending: Family Medicine | Admitting: Physical Therapy

## 2023-09-12 VITALS — BP 117/62 | HR 74

## 2023-09-12 DIAGNOSIS — R2681 Unsteadiness on feet: Secondary | ICD-10-CM | POA: Diagnosis not present

## 2023-09-12 DIAGNOSIS — M6281 Muscle weakness (generalized): Secondary | ICD-10-CM

## 2023-09-12 DIAGNOSIS — R278 Other lack of coordination: Secondary | ICD-10-CM

## 2023-09-12 DIAGNOSIS — R2689 Other abnormalities of gait and mobility: Secondary | ICD-10-CM | POA: Diagnosis not present

## 2023-09-12 DIAGNOSIS — M25642 Stiffness of left hand, not elsewhere classified: Secondary | ICD-10-CM | POA: Insufficient documentation

## 2023-09-12 DIAGNOSIS — R29898 Other symptoms and signs involving the musculoskeletal system: Secondary | ICD-10-CM

## 2023-09-12 DIAGNOSIS — M25632 Stiffness of left wrist, not elsewhere classified: Secondary | ICD-10-CM | POA: Diagnosis not present

## 2023-09-12 DIAGNOSIS — R262 Difficulty in walking, not elsewhere classified: Secondary | ICD-10-CM

## 2023-09-12 NOTE — Therapy (Signed)
 OUTPATIENT OCCUPATIONAL THERAPY NEURO TREATMENT  Patient Name: Tony Schmitt MRN: [id number] DOB:1970/12/21, 53 y.o., male Today's Date: 09/12/2023  PCP: Delbert Clam, MD REFERRING PROVIDER: Delbert Clam, MD   END OF SESSION:  OT End of Session - 09/12/23 1409     Visit Number 3    Number of Visits 9    Date for OT Re-Evaluation 10/11/23    Authorization Type UHC Medicare - requires auth    Progress Note Due on Visit 9    OT Start Time 1408    OT Stop Time 1446    OT Time Calculation (min) 38 min    Activity Tolerance Patient tolerated treatment well    Behavior During Therapy WFL for tasks assessed/performed            Past Medical History:  Diagnosis Date   Anemia    Chronic kidney disease    Diabetes mellitus without complication (HCC)    GERD (gastroesophageal reflux disease)    Hypertension    Sleep apnea    Stroke [facility])    Past Surgical History:  Procedure Laterality Date   AV FISTULA PLACEMENT Right 04/30/2023   Procedure: INSERTION OF RIGHT UPPER ARM GORETEX  GRAFT;  Surgeon: Eliza Lonni RAMAN, MD;  Location: [facility] OR;  Service: Vascular;  Laterality: Right;   BUBBLE STUDY  04/18/2020   Procedure: BUBBLE STUDY;  Surgeon: Shlomo Wilbert SAUNDERS, MD;  Location: [facility] ENDOSCOPY;  Service: Cardiovascular;;   TEE WITHOUT CARDIOVERSION N/A 04/18/2020   Procedure: TRANSESOPHAGEAL ECHOCARDIOGRAM (TEE);  Surgeon: Shlomo Wilbert SAUNDERS, MD;  Location: [facility] ENDOSCOPY;  Service: Cardiovascular;  Laterality: N/A;   Patient Active Problem List   Diagnosis Date Noted   Spastic hemiplegia, unspecified etiology, unspecified laterality (HCC) 01/07/2023   Acquired left foot drop 05/22/2022   Nephrotic syndrome    Peripheral edema    Abnormality of gait 05/23/2020   Type 2 diabetes mellitus with hyperglycemia, without long-term current use of insulin  (HCC)    Thrombocytosis    Benign essential HTN    Acute blood loss anemia    Leukocytosis    Slow transit constipation    AKI (acute  kidney injury) (HCC)    Diabetes mellitus type 2 with complications, uncontrolled    CVA (cerebral vascular accident) (HCC) 04/22/2020   Acute left-sided weakness    Tobacco abuse    Essential hypertension    Acute CVA (cerebrovascular accident) (HCC) 04/16/2020   Hypertensive urgency 04/16/2020   Type 2 diabetes mellitus with vascular disease (HCC) 04/16/2020   CKD (chronic kidney disease) stage 3, GFR 30-59 ml/min (HCC) 04/23/2019   Hyperlipidemia 04/18/2019   Dyslipidemia associated with type 2 diabetes mellitus (HCC) 08/29/2018   Hypertension associated with type 2 diabetes mellitus (HCC) 08/29/2018    ONSET DATE: 08/06/2023 (Date of referral)  REFERRING DIAG: M62.81 (ICD-10-CM) - Muscle weakness (generalized)   THERAPY DIAG:  Stiffness of left hand, not elsewhere classified  Stiffness of left wrist, not elsewhere classified  Muscle weakness (generalized)  Other lack of coordination  Other symptoms and signs involving the musculoskeletal system  Rationale for Evaluation and Treatment: Rehabilitation  SUBJECTIVE:   SUBJECTIVE STATEMENT: He does not have a heating pad or NMES at home.   Pt accompanied by: self  PERTINENT HISTORY: CKD, DM, HTN, infarct stroke (2021)  Reason for Referral: Left upper extremity weakness   PRECAUTIONS: Fall and Other: fistula on R arm; latex allergy   WEIGHT BEARING RESTRICTIONS: No  PAIN:  Are you having pain? No  FALLS: Has patient fallen in last 6 months? No  LIVING ENVIRONMENT: Lives with: lives with their family Lives in: House/apartment Stairs: Yes: External: 4 steps; can reach both Has following equipment at home: Single point cane, Walker - 2 wheeled, Wheelchair (manual), and shower chair   PLOF: Independent with gait, Independent with transfers, Requires assistive device for independence, and Needs assistance with ADLs  PATIENT GOALS: get my hands to get a grip   OBJECTIVE:  Note: Objective measures were completed  at Evaluation unless otherwise noted.  HAND DOMINANCE: Right  ADLs: Eating: assistance to cut food Grooming: mod I UB Dressing: mod I LB Dressing: mod I Toileting: mod I Bathing: mod I  Equipment: Shower seat with back and Long handled sponge  IADLs: Shopping: mod I Light housekeeping: only cleans bathroom, no assistance Meal Prep: light meal prep mod I Community mobility: dependent Medication management: dependent Financial management: dependent Handwriting:  no change  MOBILITY STATUS: Needs Assist: SPC  ACTIVITY TOLERANCE: Activity tolerance: good to fair  FUNCTIONAL OUTCOME MEASURES: Quick Dash: 38.6 %   UPPER EXTREMITY ROM:     AROM Right (eval) Left (eval)  Shoulder flexion WNL 80  Shoulder abduction WNL 70  Elbow flexion WNL 90  Elbow extension WNL -30  Wrist flexion WNL slight  Wrist extension WNL slight  Wrist pronation WNL lacks active motion  Wrist supination WNL lacks active motion   Digit Composite Flexion WNL lacks active motion  Digit Composite Extension WNL lacks active motion  Digit Opposition WNL lacks active motion  (Blank rows = not tested)  LUE ER just above shoulder; IR just in front of hip  HAND FUNCTION: Lacks active motion  COORDINATION: lacks active motion  SENSATION: WFL  EDEMA: mild edema to L hand  MUSCLE TONE: LUE: Moderate  COGNITION: Overall cognitive status: History of cognitive impairments - at baseline  VISION: Subjective report: no change Baseline vision: Wears glasses all the time and Wears glasses for reading only Visual history:  n/a  PERCEPTION: Not tested  PRAXIS: Not tested  OBSERVATIONS: Pt appears well kept. Significant spasticity to LUE with minimal AROM. SPC for ambulation.                                                                                                                           TREATMENT DATE:    OT educated pt on spasticity management of LUE as noted in pt instructions.    Pt completed LUE AAROM with L hand placed on top of cane moving into cross pattern for improved ROM and motor control.   OT reviewed LUE ROM HEP with special instruction for L digit ext stretching. Pt required min cueing for proper execution.   PATIENT EDUCATION: Education details: LUE management Person educated: Patient Education method: Explanation, Demonstration, Verbal cues, and Handouts Education comprehension: verbalized understanding, returned demonstration, verbal cues required, and needs further education  HOME EXERCISE PROGRAM: 09/12/2023: Ways to Manage Spasticity  09/05/23 - Initial UE  HEP/stretches - Access Code: [email or web address]  09/12/2023 - spasticity mangement  GOALS:  SHORT TERM GOALS: Target date: 09/12/2023    Pt will independently recall at least 3 ways to manage LUE spasticity. Baseline: Goal status: IN PROGRESS  2.  Pt will demonstrate volitional use of LUE (I.e. for stabilization) at least 25% of the time. Baseline: 0% - required cues, holds LUE in guarded position Goal status: IN Progress   LONG TERM GOALS: Target date: 10/11/2023  Patient will demonstrate updated LUE HEP with 25% verbal cues or less for proper execution. Baseline:  Goal status: IN Progress   2.  Pt will report no more than moderate difficulty cutting food using adaptive strategies/AD as needed. Baseline: unable Goal status: INITIAL   ASSESSMENT:  CLINICAL IMPRESSION: Patient demonstrates improved understanding of LUE ROM HEP. Still lacking in volitional use of affected extremity and would benefit from additional education on incorporation.   PERFORMANCE DEFICITS: in functional skills including ADLs, IADLs, coordination, tone, ROM, strength, Fine motor control, Gross motor control, decreased knowledge of use of DME, and UE functional use.   IMPAIRMENTS: are limiting patient from ADLs, IADLs, leisure, and social participation.   CO-MORBIDITIES: may have co-morbidities  that affects  occupational performance. Patient will benefit from skilled OT to address above impairments and improve overall function.  REHAB POTENTIAL:  Fair chronicity of CVA   PLAN:  OT FREQUENCY: 1x/week  OT DURATION: 8 weeks  PLANNED INTERVENTIONS: 97168 OT Re-evaluation, 97535 self care/ADL training, 02889 therapeutic exercise, 97530 therapeutic activity, 97140 manual therapy, 97035 ultrasound, [id number] electrical stimulation (manual), [id number] Orthotics management and training, 02239 Splinting (initial encounter), [id number] Subsequent splinting/medication, passive range of motion, functional mobility training, patient/family education, and DME and/or AE instructions  RECOMMENDED OTHER SERVICES: N/A for this visit  CONSULTED AND AGREED WITH PLAN OF CARE: Patient  PLAN FOR NEXT SESSION: ROM HEP; NMES; adjust splint as needed   Jocelyn CHRISTELLA Bottom, OT 09/12/2023, 4:33 PM

## 2023-09-12 NOTE — Therapy (Signed)
 OUTPATIENT PHYSICAL THERAPY NEURO TREATMENT   Patient Name: Tony Schmitt MRN: [id number] DOB:04-29-1971, 53 y.o., male Today's Date: 09/12/2023   PCP: Delbert Clam, MD REFERRING PROVIDER: Delbert Clam, MD  END OF SESSION:  PT End of Session - 09/12/23 1453     Visit Number 5    Number of Visits 9    Date for PT Re-Evaluation 10/28/23    Authorization Type [employer] Dual    PT Start Time 1448    PT Stop Time 1530    PT Time Calculation (min) 42 min    Equipment Utilized During Treatment Gait belt    Activity Tolerance Patient tolerated treatment well    Behavior During Therapy Flat affect               Past Medical History:  Diagnosis Date   Anemia    Chronic kidney disease    Diabetes mellitus without complication (HCC)    GERD (gastroesophageal reflux disease)    Hypertension    Sleep apnea    Stroke [facility])    Past Surgical History:  Procedure Laterality Date   AV FISTULA PLACEMENT Right 04/30/2023   Procedure: INSERTION OF RIGHT UPPER ARM GORETEX  GRAFT;  Surgeon: Eliza Lonni RAMAN, MD;  Location: [facility] OR;  Service: Vascular;  Laterality: Right;   BUBBLE STUDY  04/18/2020   Procedure: BUBBLE STUDY;  Surgeon: Shlomo Wilbert SAUNDERS, MD;  Location: [facility] ENDOSCOPY;  Service: Cardiovascular;;   TEE WITHOUT CARDIOVERSION N/A 04/18/2020   Procedure: TRANSESOPHAGEAL ECHOCARDIOGRAM (TEE);  Surgeon: Shlomo Wilbert SAUNDERS, MD;  Location: [facility] ENDOSCOPY;  Service: Cardiovascular;  Laterality: N/A;   Patient Active Problem List   Diagnosis Date Noted   Spastic hemiplegia, unspecified etiology, unspecified laterality (HCC) 01/07/2023   Acquired left foot drop 05/22/2022   Nephrotic syndrome    Peripheral edema    Abnormality of gait 05/23/2020   Type 2 diabetes mellitus with hyperglycemia, without long-term current use of insulin  (HCC)    Thrombocytosis    Benign essential HTN    Acute blood loss anemia    Leukocytosis    Slow transit constipation    AKI (acute kidney injury)  (HCC)    Diabetes mellitus type 2 with complications, uncontrolled    CVA (cerebral vascular accident) (HCC) 04/22/2020   Acute left-sided weakness    Tobacco abuse    Essential hypertension    Acute CVA (cerebrovascular accident) (HCC) 04/16/2020   Hypertensive urgency 04/16/2020   Type 2 diabetes mellitus with vascular disease (HCC) 04/16/2020   CKD (chronic kidney disease) stage 3, GFR 30-59 ml/min (HCC) 04/23/2019   Hyperlipidemia 04/18/2019   Dyslipidemia associated with type 2 diabetes mellitus (HCC) 08/29/2018   Hypertension associated with type 2 diabetes mellitus (HCC) 08/29/2018    ONSET DATE: 07/18/2023  REFERRING DIAG: G81.10 (ICD-10-CM) - Spastic hemiplegia, unspecified etiology, unspecified laterality (HCC)  THERAPY DIAG:  Muscle weakness (generalized)  Other lack of coordination  Other symptoms and signs involving the musculoskeletal system  Difficulty in walking, not elsewhere classified  Unsteadiness on feet  Other abnormalities of gait and mobility  Rationale for Evaluation and Treatment: Rehabilitation  SUBJECTIVE:  SUBJECTIVE STATEMENT: Blood glucose 168 this morning - pt does not have finger prick setup in clinic with him.  He states he is not having pain today and wants to continue working on his left leg.  Pt accompanied by: family member mom (in lobby)  PERTINENT HISTORY: CKD, DM, HTN, stroke (2021)  PAIN:  Are you having pain? No  PRECAUTIONS: Fall and Other: fistula on R arm  RED FLAGS: None   WEIGHT BEARING RESTRICTIONS: No  FALLS: Has patient fallen in last 6 months? No  LIVING ENVIRONMENT: Lives with: lives with their family Lives in: House/apartment Stairs: Yes: External: 4 steps; can reach both Has following equipment at home: Single point  cane, Walker - 2 wheeled, Wheelchair (manual), and shower chair  PLOF: Independent with gait, Independent with transfers, Requires assistive device for independence, and Needs assistance with ADLs  PATIENT GOALS: get my hands to get a grip work towards being able to get a kidney  OBJECTIVE:  Note: Objective measures were completed at Evaluation unless otherwise noted.  DIAGNOSTIC FINDINGS: None relevant to this POC  COGNITION: Overall cognitive status: Within functional limits for tasks assessed   SENSATION: Gets N/T in L hemibody every 3 months or so  EDEMA:  Swelling in L hand  POSTURE: rounded shoulders, forward head, and posterior pelvic tilt  LOWER EXTREMITY ROM:     Active  Right Eval Left Eval  Hip flexion    Hip extension    Hip abduction    Hip adduction    Hip internal rotation    Hip external rotation    Knee flexion    Knee extension    Ankle dorsiflexion    Ankle plantarflexion    Ankle inversion    Ankle eversion     (Blank rows = not tested)  LOWER EXTREMITY MMT:    MMT Right Eval Left Eval  Hip flexion 4 3  Hip extension    Hip abduction    Hip adduction    Hip internal rotation    Hip external rotation    Knee flexion 5 2-  Knee extension 5 2-  Ankle dorsiflexion 5 3  Ankle plantarflexion    Ankle inversion    Ankle eversion    (Blank rows = not tested)  BED MOBILITY:  Mod I per pt report, utilizes bedrail and has an adjustable bed  TRANSFERS: Assistive device utilized: None  Sit to stand: Modified independence Stand to sit: Modified independence Chair to chair: Modified independence  GAIT: Gait pattern: decreased arm swing- Left, decreased hip/knee flexion- Left, decreased ankle dorsiflexion- Left, circumduction- Left, lateral lean- Right, and abducted- Left Distance walked: various clinic distances Assistive device utilized: Single point cane Level of assistance: Modified independence Comments: L hemiparesis                                                                              TREATMENT:  Vitals:   09/12/23 1452  BP: 117/62  Pulse: 74  Seated on LUE at rest.  -SciFit x8 minutes progressing to level 3.0 using BUE/BLE w/ focus on engaging left UE for stretch and grip strength as well and LLE for strengthening and mobility.  RPE 7/10 following  task.  On doubled blue mat in // bars in stride w/ LLE in rear: -PT facilitates LUE placement and provides active assist to engage arm during ball chest press w/ resistance -PT provides resistance to push and pull task using 2lb weighted dowel, intermittently used // bar to slide bar as LUE fatigued quickly -Left arm scarp toss w/ right arm catch, with repetition pt able to release pinch grip better, x8, LUE in IR during elevation w/ PT attempting to facilitate ER to neutral w/o interfering with pt momentum  -Standing wide stance at mirror pt removed long squigz using more cylindrical grip and active assist from RUE to pull intermittently and then place, had pt step into placement to facilitate LE motor planning and weight shift as well as upper body engagement  PATIENT EDUCATION: Education details: goals of tasks today, check blood sugar before therapy sessions at home so OT/PT have accurate data at onset of session, and continue HEP Person educated: Patient Education method: Explanation, Demonstration, Tactile cues, and Verbal cues Education comprehension: verbalized understanding, returned demonstration, tactile cues required, and needs further education  HOME EXERCISE PROGRAM: Access Code: [email or web address] URL: [email or web address] Date: 08/15/2023 Prepared by: Daved Bull  Exercises - Staggered Sit-to-Stand  - 1 x daily - 7 x weekly - 3 sets - 8-10 reps - Side to Side Weight Shift with Counter Support  - 1 x daily - 7 x weekly - 3 sets - 10 reps - Staggered Stance Forward Backward Weight Shift with Counter Support  - 1 x daily - 7 x weekly  - 3 sets - 10 reps   GOALS: Goals reviewed with patient? Yes  SHORT TERM GOALS: Target date: 09/02/2023  Pt will be independent with initial HEP for improved strength, balance, transfers and gait. Baseline: reviewed 1/30 Goal status: IN PROGRESS  2.  Pt will improve gait velocity to at least 1.75 ft/sec for improved gait efficiency and performance at mod I level  Baseline: 1.32 ft/sec mod I SPC (12/30), 1.45 ft/sec mod I no AD (1/30) Goal status: IN PROGRESS  3.  Pt will improve normal TUG to less than or equal to 19 seconds for improved functional mobility and decreased fall risk. Baseline: 23.81 sec (12/30), 23.15 sec (1/30) Goal status: IN PROGRESS  4.  Pt will improve Berg score to 39/56 for decreased fall risk Baseline: 34/56 (12/30), 45/56 (1/30) Goal status: MET  LONG TERM GOALS: Target date: 10/03/2023  Pt will be independent with final HEP for improved strength, balance, transfers and gait. Baseline:  Goal status: INITIAL  2.  Pt will improve gait velocity to at least 1.75 ft/sec for improved gait efficiency and performance at mod I level  Baseline: 1.32 ft/sec mod I SPC (12/30), 1.45 ft/sec mod I no AD (1/30) Goal status: REVISED  3.  Pt will improve normal TUG to less than or equal to 19 seconds for improved functional mobility and decreased fall risk. Baseline: 23.81 sec (12/30), 23.15 sec (1/30)  Goal status: REVISED  4.  Pt will improve Berg score to 44/56 for decreased fall risk Baseline: 34/56 (12/30), 45/56 (1/30) Goal status: MET    ASSESSMENT:  CLINICAL IMPRESSION: Emphasis of skilled PT session on practicing engaging LUE with LLE weight-bearing to improve hemiparetic activation.  He is better able to engage the LUE during all functional tasks today, but maintains excessive IR.  PT encouraging active assist to prevent LUE from tightening during activation.  His stride balance appeared improved today and he continues to benefit  from skilled PT in this  setting to improve functional mobility and maintain safe independence.   OBJECTIVE IMPAIRMENTS: Abnormal gait, decreased balance, decreased coordination, decreased mobility, difficulty walking, decreased ROM, decreased strength, increased muscle spasms, impaired sensation, impaired tone, impaired UE functional use, improper body mechanics, and postural dysfunction.   ACTIVITY LIMITATIONS: carrying, lifting, bending, stairs, transfers, dressing, and reach over head  PARTICIPATION LIMITATIONS: driving and community activity  PERSONAL FACTORS: Time since onset of injury/illness/exacerbation, Transportation, and 3+ comorbidities:    CKD, DM, HTN, strokeare also affecting patient's functional outcome.   REHAB POTENTIAL: Fair chronicity of CVA  CLINICAL DECISION MAKING: Stable/uncomplicated  EVALUATION COMPLEXITY: Low  PLAN:  PT FREQUENCY: 1x/week  PT DURATION: 8 weeks  PLANNED INTERVENTIONS: 97164- PT Re-evaluation, 97110-Therapeutic exercises, 97530- Therapeutic activity, [id number]- Neuromuscular re-education, 97535- Self Care, [id number]- Manual therapy, [phone]- Gait training, [phone]- Orthotic Fit/training, [phone]- Aquatic Therapy, [phone]- Electrical stimulation (manual), Patient/Family education, Balance training, Stair training, Taping, Dry Needling, Joint mobilization, Spinal mobilization, DME instructions, Cryotherapy, and Moist heat  PLAN FOR NEXT SESSION: add to HEP to work on functional strengthening of LLE (tall kneel? Staggered stance tasks), powder board?  Left NMR, what are glucose levels, assess BP - L arm d/t R fistula; does he need left AFO?  Daved KATHEE Bull, PT, DPT   09/12/2023, 4:08 PM

## 2023-09-12 NOTE — Patient Instructions (Signed)
 Ways to Manage Spasticity Heat  Deep pressure  Stretching  Use of a splint at night  NMES unit  Botox (injections as prescribed by a physician)  Muscle Relaxers (medications as prescribed by a physician)

## 2023-09-15 ENCOUNTER — Other Ambulatory Visit: Payer: Self-pay | Admitting: Family Medicine

## 2023-09-15 DIAGNOSIS — E1122 Type 2 diabetes mellitus with diabetic chronic kidney disease: Secondary | ICD-10-CM

## 2023-09-17 ENCOUNTER — Encounter: Payer: 59 | Admitting: Family Medicine

## 2023-09-19 ENCOUNTER — Ambulatory Visit: Payer: 59 | Admitting: Occupational Therapy

## 2023-09-19 ENCOUNTER — Encounter: Payer: Self-pay | Admitting: Physical Therapy

## 2023-09-19 ENCOUNTER — Ambulatory Visit: Payer: 59 | Admitting: Physical Therapy

## 2023-09-19 VITALS — BP 129/62 | HR 74

## 2023-09-19 DIAGNOSIS — R278 Other lack of coordination: Secondary | ICD-10-CM | POA: Diagnosis not present

## 2023-09-19 DIAGNOSIS — R2681 Unsteadiness on feet: Secondary | ICD-10-CM | POA: Diagnosis not present

## 2023-09-19 DIAGNOSIS — R29898 Other symptoms and signs involving the musculoskeletal system: Secondary | ICD-10-CM

## 2023-09-19 DIAGNOSIS — M6281 Muscle weakness (generalized): Secondary | ICD-10-CM | POA: Diagnosis not present

## 2023-09-19 DIAGNOSIS — M25632 Stiffness of left wrist, not elsewhere classified: Secondary | ICD-10-CM

## 2023-09-19 DIAGNOSIS — M25642 Stiffness of left hand, not elsewhere classified: Secondary | ICD-10-CM | POA: Diagnosis not present

## 2023-09-19 DIAGNOSIS — R2689 Other abnormalities of gait and mobility: Secondary | ICD-10-CM

## 2023-09-19 DIAGNOSIS — R262 Difficulty in walking, not elsewhere classified: Secondary | ICD-10-CM

## 2023-09-19 NOTE — Therapy (Signed)
 OUTPATIENT PHYSICAL THERAPY NEURO TREATMENT   Patient Name: Tony Schmitt MRN: [id number] DOB:13-Apr-1971, 53 y.o., male Today's Date: 09/19/2023   PCP: Hoy Register, MD REFERRING PROVIDER: Hoy Register, MD  END OF SESSION:    PT End of Session - 09/19/23 1617     Visit Number 6    Number of Visits 9    Date for PT Re-Evaluation 10/28/23    Authorization Type [employer] Dual    PT Start Time 1404    PT Stop Time 1445    PT Time Calculation (min) 41 min    Equipment Utilized During Treatment Gait belt    Activity Tolerance Patient tolerated treatment well    Behavior During Therapy WFL for tasks assessed/performed             Past Medical History:  Diagnosis Date   Anemia    Chronic kidney disease    Diabetes mellitus without complication (HCC)    GERD (gastroesophageal reflux disease)    Hypertension    Sleep apnea    Stroke [facility])    Past Surgical History:  Procedure Laterality Date   AV FISTULA PLACEMENT Right 04/30/2023   Procedure: INSERTION OF RIGHT UPPER ARM GORETEX  GRAFT;  Surgeon: Chuck Hint, MD;  Location: [facility] OR;  Service: Vascular;  Laterality: Right;   BUBBLE STUDY  04/18/2020   Procedure: BUBBLE STUDY;  Surgeon: Quintella Reichert, MD;  Location: [facility] ENDOSCOPY;  Service: Cardiovascular;;   TEE WITHOUT CARDIOVERSION N/A 04/18/2020   Procedure: TRANSESOPHAGEAL ECHOCARDIOGRAM (TEE);  Surgeon: Quintella Reichert, MD;  Location: [facility] ENDOSCOPY;  Service: Cardiovascular;  Laterality: N/A;   Patient Active Problem List   Diagnosis Date Noted   Spastic hemiplegia, unspecified etiology, unspecified laterality (HCC) 01/07/2023   Acquired left foot drop 05/22/2022   Nephrotic syndrome    Peripheral edema    Abnormality of gait 05/23/2020   Type 2 diabetes mellitus with hyperglycemia, without long-term current use of insulin (HCC)    Thrombocytosis    Benign essential HTN    Acute blood loss anemia    Leukocytosis    Slow transit constipation    AKI  (acute kidney injury) (HCC)    Diabetes mellitus type 2 with complications, uncontrolled    CVA (cerebral vascular accident) (HCC) 04/22/2020   Acute left-sided weakness    Tobacco abuse    Essential hypertension    Acute CVA (cerebrovascular accident) (HCC) 04/16/2020   Hypertensive urgency 04/16/2020   Type 2 diabetes mellitus with vascular disease (HCC) 04/16/2020   CKD (chronic kidney disease) stage 3, GFR 30-59 ml/min (HCC) 04/23/2019   Hyperlipidemia 04/18/2019   Dyslipidemia associated with type 2 diabetes mellitus (HCC) 08/29/2018   Hypertension associated with type 2 diabetes mellitus (HCC) 08/29/2018    ONSET DATE: 07/18/2023  REFERRING DIAG: G81.10 (ICD-10-CM) - Spastic hemiplegia, unspecified etiology, unspecified laterality (HCC)  THERAPY DIAG:  Unsteadiness on feet  Other abnormalities of gait and mobility  Difficulty in walking, not elsewhere classified  Rationale for Evaluation and Treatment: Rehabilitation  SUBJECTIVE:  SUBJECTIVE STATEMENT: Patient reports doing well and he brought his AFO. Denies falls and near falls.   Pt accompanied by: family member mom (in lobby)  PERTINENT HISTORY: CKD, DM, HTN, stroke (2021)  PAIN:  Are you having pain? No  PRECAUTIONS: Fall and Other: fistula on R arm  RED FLAGS: None   WEIGHT BEARING RESTRICTIONS: No  FALLS: Has patient fallen in last 6 months? No  LIVING ENVIRONMENT: Lives with: lives with their family Lives in: House/apartment Stairs: Yes: External: 4 steps; can reach both Has following equipment at home: Single point cane, Walker - 2 wheeled, Wheelchair (manual), and shower chair  PLOF: Independent with gait, Independent with transfers, Requires assistive device for independence, and Needs assistance with  ADLs  PATIENT GOALS: "get my hands to get a grip" "work towards being able to get a kidney"  OBJECTIVE:  Note: Objective measures were completed at Evaluation unless otherwise noted.  DIAGNOSTIC FINDINGS: None relevant to this POC  COGNITION: Overall cognitive status: Within functional limits for tasks assessed   SENSATION: Gets N/T in L hemibody every 3 months or so  EDEMA:  Swelling in L hand  POSTURE: rounded shoulders, forward head, and posterior pelvic tilt  LOWER EXTREMITY ROM:     Active  Right Eval Left Eval  Hip flexion    Hip extension    Hip abduction    Hip adduction    Hip internal rotation    Hip external rotation    Knee flexion    Knee extension    Ankle dorsiflexion    Ankle plantarflexion    Ankle inversion    Ankle eversion     (Blank rows = not tested)  LOWER EXTREMITY MMT:    MMT Right Eval Left Eval  Hip flexion 4 3  Hip extension    Hip abduction    Hip adduction    Hip internal rotation    Hip external rotation    Knee flexion 5 2-  Knee extension 5 2-  Ankle dorsiflexion 5 3  Ankle plantarflexion    Ankle inversion    Ankle eversion    (Blank rows = not tested)  BED MOBILITY:  Mod I per pt report, utilizes bedrail and has an adjustable bed  TRANSFERS: Assistive device utilized: None  Sit to stand: Modified independence Stand to sit: Modified independence Chair to chair: Modified independence  GAIT: Gait pattern: decreased arm swing- Left, decreased hip/knee flexion- Left, decreased ankle dorsiflexion- Left, circumduction- Left, lateral lean- Right, and abducted- Left Distance walked: various clinic distances Assistive device utilized: Single point cane Level of assistance: Modified independence Comments: L hemiparesis                                                                             TREATMENT:  Vitals:   09/19/23 1410  BP: 129/62  Pulse: 74  Seated on LUE at rest  Self Care: Physical therapist briefly  talks with PT's mother about glucose levels, per mother glucose reading was 98 and patient just drank juice immediately before PT session, assessed vitals as noted above, discussed AFO management and bringing correct wider shoes to future sessions  TherAct: Physical therapist works with patient to don/doff  AFO, patient requires modA progressing to minA however, unable to get AFO into shoe to practice ambulating with device as patient brought his narrow rather than wide shoes, trialed with shoe horn  AFO Notes: custom L anterior guard AFO, some increase space around medial malleoli, may be worth having patient follow up with [employer] to see if he needs any additional padding   NMR:  On mat with CGA-minA from therapist for manegement of L hemiparetic side Tall kneel with UE on bench, going from forearm prop to full elbow extended to no UE support ~5x of each with 30 second holds without UE support Tall kneel to half kneel with LLE forward, requiring minA-modA to advance LLE 4x  PATIENT EDUCATION: Education details: goals of tasks today, check blood sugar before therapy sessions at home so OT/PT have accurate data at onset of session, and continue HEP Person educated: Patient Education method: Explanation, Demonstration, Tactile cues, and Verbal cues Education comprehension: verbalized understanding, returned demonstration, tactile cues required, and needs further education  HOME EXERCISE PROGRAM: Access Code: [email or web address] URL: [email or web address] Date: 08/15/2023 Prepared by: Camille Bal  Exercises - Staggered Sit-to-Stand  - 1 x daily - 7 x weekly - 3 sets - 8-10 reps - Side to Side Weight Shift with Counter Support  - 1 x daily - 7 x weekly - 3 sets - 10 reps - Staggered Stance Forward Backward Weight Shift with Counter Support  - 1 x daily - 7 x weekly - 3 sets - 10 reps   GOALS: Goals reviewed with patient? Yes  SHORT TERM GOALS: Target date: 09/02/2023  Pt will be  independent with initial HEP for improved strength, balance, transfers and gait. Baseline: reviewed 1/30 Goal status: IN PROGRESS  2.  Pt will improve gait velocity to at least 1.75 ft/sec for improved gait efficiency and performance at mod I level  Baseline: 1.32 ft/sec mod I SPC (12/30), 1.45 ft/sec mod I no AD (1/30) Goal status: IN PROGRESS  3.  Pt will improve normal TUG to less than or equal to 19 seconds for improved functional mobility and decreased fall risk. Baseline: 23.81 sec (12/30), 23.15 sec (1/30) Goal status: IN PROGRESS  4.  Pt will improve Berg score to 39/56 for decreased fall risk Baseline: 34/56 (12/30), 45/56 (1/30) Goal status: MET  LONG TERM GOALS: Target date: 10/03/2023  Pt will be independent with final HEP for improved strength, balance, transfers and gait. Baseline:  Goal status: INITIAL  2.  Pt will improve gait velocity to at least 1.75 ft/sec for improved gait efficiency and performance at mod I level  Baseline: 1.32 ft/sec mod I SPC (12/30), 1.45 ft/sec mod I no AD (1/30) Goal status: REVISED  3.  Pt will improve normal TUG to less than or equal to 19 seconds for improved functional mobility and decreased fall risk. Baseline: 23.81 sec (12/30), 23.15 sec (1/30)  Goal status: REVISED  4.  Pt will improve Berg score to 44/56 for decreased fall risk Baseline: 34/56 (12/30), 45/56 (1/30) Goal status: MET    ASSESSMENT:  CLINICAL IMPRESSION: Emphasis of skilled PT session on working on management of AFO, review of glucose levels, and part practice balance work in half and tall kneel for proximal stability. Session limited as patient shoes brought to today's session were not wide enough to accommodate AFO; patient to bring his wider shoes next session. Patient reports enjoying half and tall kneel challenges with good stretch to adductors in half kneel. Continue POC.  OBJECTIVE IMPAIRMENTS: Abnormal gait, decreased balance, decreased coordination,  decreased mobility, difficulty walking, decreased ROM, decreased strength, increased muscle spasms, impaired sensation, impaired tone, impaired UE functional use, improper body mechanics, and postural dysfunction.   ACTIVITY LIMITATIONS: carrying, lifting, bending, stairs, transfers, dressing, and reach over head  PARTICIPATION LIMITATIONS: driving and community activity  PERSONAL FACTORS: Time since onset of injury/illness/exacerbation, Transportation, and 3+ comorbidities:    CKD, DM, HTN, strokeare also affecting patient's functional outcome.   REHAB POTENTIAL: Fair chronicity of CVA  CLINICAL DECISION MAKING: Stable/uncomplicated  EVALUATION COMPLEXITY: Low  PLAN:  PT FREQUENCY: 1x/week  PT DURATION: 8 weeks  PLANNED INTERVENTIONS: 97164- PT Re-evaluation, 97110-Therapeutic exercises, 97530- Therapeutic activity, [id number]- Neuromuscular re-education, 97535- Self Care, [id number]- Manual therapy, [phone]- Gait training, [phone]- Orthotic Fit/training, [phone]- Aquatic Therapy, [phone]- Electrical stimulation (manual), Patient/Family education, Balance training, Stair training, Taping, Dry Needling, Joint mobilization, Spinal mobilization, DME instructions, Cryotherapy, and Moist heat  PLAN FOR NEXT SESSION: add to HEP to work on functional strengthening of LLE (tall kneel? Staggered stance tasks), powder board?  Left NMR, work with AFO in wider shoes, does patient need follow up with [employer] for any adjustments to AFO, currently scheduled for end of POC in just a few visits   Carmelia Bake, PT, DPT  = 09/19/2023, 4:31 PM

## 2023-09-19 NOTE — Therapy (Signed)
OUTPATIENT OCCUPATIONAL THERAPY NEURO TREATMENT  Patient Name: Tony Schmitt MRN: [id number] DOB:[birth year], 53 y.o., male Today's Date: 09/19/2023  PCP: Hoy Register, MD REFERRING PROVIDER: Hoy Register, MD   END OF SESSION:  OT End of Session - 09/19/23 1451     Visit Number 4    Number of Visits 9    Date for OT Re-Evaluation 10/11/23    Authorization Type [employer] Medicare - requires auth    Progress Note Due on Visit 9    OT Start Time 1449    OT Stop Time 1530    OT Time Calculation (min) 41 min    Activity Tolerance Patient tolerated treatment well    Behavior During Therapy WFL for tasks assessed/performed             Past Medical History:  Diagnosis Date   Anemia    Chronic kidney disease    Diabetes mellitus without complication (HCC)    GERD (gastroesophageal reflux disease)    Hypertension    Sleep apnea    Stroke [facility])    Past Surgical History:  Procedure Laterality Date   AV FISTULA PLACEMENT Right 04/30/2023   Procedure: INSERTION OF RIGHT UPPER ARM GORETEX  GRAFT;  Surgeon: Chuck Hint, MD;  Location: [facility] OR;  Service: Vascular;  Laterality: Right;   BUBBLE STUDY  04/18/2020   Procedure: BUBBLE STUDY;  Surgeon: Quintella Reichert, MD;  Location: [facility] ENDOSCOPY;  Service: Cardiovascular;;   TEE WITHOUT CARDIOVERSION N/A 04/18/2020   Procedure: TRANSESOPHAGEAL ECHOCARDIOGRAM (TEE);  Surgeon: Quintella Reichert, MD;  Location: [facility] ENDOSCOPY;  Service: Cardiovascular;  Laterality: N/A;   Patient Active Problem List   Diagnosis Date Noted   Spastic hemiplegia, unspecified etiology, unspecified laterality (HCC) 01/07/2023   Acquired left foot drop 05/22/2022   Nephrotic syndrome    Peripheral edema    Abnormality of gait 05/23/2020   Type 2 diabetes mellitus with hyperglycemia, without long-term current use of insulin (HCC)    Thrombocytosis    Benign essential HTN    Acute blood loss anemia    Leukocytosis    Slow transit constipation    AKI  (acute kidney injury) (HCC)    Diabetes mellitus type 2 with complications, uncontrolled    CVA (cerebral vascular accident) (HCC) 04/22/2020   Acute left-sided weakness    Tobacco abuse    Essential hypertension    Acute CVA (cerebrovascular accident) (HCC) 04/16/2020   Hypertensive urgency 04/16/2020   Type 2 diabetes mellitus with vascular disease (HCC) 04/16/2020   CKD (chronic kidney disease) stage 3, GFR 30-59 ml/min (HCC) 04/23/2019   Hyperlipidemia 04/18/2019   Dyslipidemia associated with type 2 diabetes mellitus (HCC) 08/29/2018   Hypertension associated with type 2 diabetes mellitus (HCC) 08/29/2018    ONSET DATE: 08/06/2023 (Date of referral)  REFERRING DIAG: M62.81 (ICD-10-CM) - Muscle weakness (generalized)   THERAPY DIAG:  Stiffness of left hand, not elsewhere classified  Stiffness of left wrist, not elsewhere classified  Muscle weakness (generalized)  Other lack of coordination  Other symptoms and signs involving the musculoskeletal system  Rationale for Evaluation and Treatment: Rehabilitation  SUBJECTIVE:   SUBJECTIVE STATEMENT: He forgot about getting a heating pad.   Pt accompanied by: self  PERTINENT HISTORY: CKD, DM, HTN, infarct stroke (2021)  Reason for Referral: Left upper extremity weakness   PRECAUTIONS: Fall and Other: fistula on R arm; latex allergy   WEIGHT BEARING RESTRICTIONS: No  PAIN:  Are you having pain? No  FALLS: Has  patient fallen in last 6 months? No  LIVING ENVIRONMENT: Lives with: lives with their family Lives in: House/apartment Stairs: Yes: External: 4 steps; can reach both Has following equipment at home: Single point cane, Walker - 2 wheeled, Wheelchair (manual), and shower chair   PLOF: Independent with gait, Independent with transfers, Requires assistive device for independence, and Needs assistance with ADLs  PATIENT GOALS: "get my hands to get a grip"   OBJECTIVE:  Note: Objective measures were completed  at Evaluation unless otherwise noted.  HAND DOMINANCE: Right  ADLs: Eating: assistance to cut food Grooming: mod I UB Dressing: mod I LB Dressing: mod I Toileting: mod I Bathing: mod I  Equipment: Shower seat with back and Long handled sponge  IADLs: Shopping: mod I Light housekeeping: only cleans bathroom, no assistance Meal Prep: light meal prep mod I Community mobility: dependent Medication management: dependent Financial management: dependent Handwriting:  no change  MOBILITY STATUS: Needs Assist: SPC  ACTIVITY TOLERANCE: Activity tolerance: good to fair  FUNCTIONAL OUTCOME MEASURES: Quick Dash: 38.6 %   UPPER EXTREMITY ROM:     AROM Right (eval) Left (eval)  Shoulder flexion WNL 80  Shoulder abduction WNL 70  Elbow flexion WNL 90  Elbow extension WNL -30  Wrist flexion WNL slight  Wrist extension WNL slight  Wrist pronation WNL lacks active motion  Wrist supination WNL lacks active motion   Digit Composite Flexion WNL lacks active motion  Digit Composite Extension WNL lacks active motion  Digit Opposition WNL lacks active motion  (Blank rows = not tested)  LUE ER just above shoulder; IR just in front of hip  HAND FUNCTION: Lacks active motion  COORDINATION: lacks active motion  SENSATION: WFL  EDEMA: mild edema to L hand  MUSCLE TONE: LUE: Moderate  COGNITION: Overall cognitive status: History of cognitive impairments - at baseline  VISION: Subjective report: no change Baseline vision: Wears glasses all the time and Wears glasses for reading only Visual history:  n/a  PERCEPTION: Not tested  PRAXIS: Not tested  OBSERVATIONS: Pt appears well kept. Significant spasticity to LUE with minimal AROM. SPC for ambulation.                                                                                                                           TREATMENT DATE:   - Attended e-stim completed for duration as noted below including: OT educated  patient on use of NMES unit/e-stim. Therapist educated patient on application for L wrist and digit extension. Patient tolerated supervised application to promote improved AROM and pain management despite lack of ROM produced with e-stim, especially with wrist ext. Modalities Skin Integrity Assessment:  NMES utilized: No redness, irritation or skin integrity concerns were noted before, during and/or after use. Patient was informed of risks and benefits to treatment today. Skin integrity prior to treatment: Intact. Skin integrity after treatment: Intact.  - Neuro re-education completed for duration as noted below including: During part of pt's  e-stim application, therapist cued pt to complete AAROM during on cycle. Slight change to ROM noted though lacked wrist ext. Pt instructed to visualize releasing an item from his hand.   - Ultrasound completed for duration as noted below including:  Ultrasound applied to dorsal left hand, digits, wrist, and forearm for 8 minutes, frequency of 3 MHz, 20% duty cycle, and 1.1 W/cm with pt's arm placed on soft towel for promotion of ROM, edema reduction, and pain reduction in affected extremity.  PATIENT EDUCATION: Education details: LUE management Person educated: Patient Education method: Explanation, Demonstration, and Verbal cues Education comprehension: verbalized understanding, returned demonstration, verbal cues required, and needs further education  HOME EXERCISE PROGRAM: 09/12/2023: Ways to Manage Spasticity  09/05/23 - Initial UE HEP/stretches - Access Code: [email or web address]  09/12/2023 - spasticity mangement  GOALS:  SHORT TERM GOALS: Target date: 09/12/2023    Pt will independently recall at least 3 ways to manage LUE spasticity. Baseline: Goal status: IN PROGRESS  2.  Pt will demonstrate volitional use of LUE (I.e. for stabilization) at least 25% of the time. Baseline: 0% - required cues, holds LUE in guarded position Goal status: IN Progress    LONG TERM GOALS: Target date: 10/11/2023  Patient will demonstrate updated LUE HEP with 25% verbal cues or less for proper execution. Baseline:  Goal status: IN Progress   2.  Pt will report no more than moderate difficulty cutting food using adaptive strategies/AD as needed. Baseline: unable Goal status: INITIAL   ASSESSMENT:  CLINICAL IMPRESSION: Patient demonstrates limited response to E-stim this visit. Unable to achieve full range of desired movements with pt tolerance of intensity. Will continue to assess if this modality is beneficial to the pt and if so further look into helping pt obtain a unit for home use given chronic stroke and spasticity.   PERFORMANCE DEFICITS: in functional skills including ADLs, IADLs, coordination, tone, ROM, strength, Fine motor control, Gross motor control, decreased knowledge of use of DME, and UE functional use.   IMPAIRMENTS: are limiting patient from ADLs, IADLs, leisure, and social participation.   CO-MORBIDITIES: may have co-morbidities  that affects occupational performance. Patient will benefit from skilled OT to address above impairments and improve overall function.  REHAB POTENTIAL:  Fair chronicity of CVA   PLAN:  OT FREQUENCY: 1x/week  OT DURATION: 8 weeks  PLANNED INTERVENTIONS: 97168 OT Re-evaluation, 97535 self care/ADL training, 47829 therapeutic exercise, 97530 therapeutic activity, 97140 manual therapy, 97035 ultrasound, [id number] electrical stimulation (manual), [id number] Orthotics management and training, 56213 Splinting (initial encounter), [id number] Subsequent splinting/medication, passive range of motion, functional mobility training, patient/family education, and DME and/or AE instructions  RECOMMENDED OTHER SERVICES: N/A for this visit  CONSULTED AND AGREED WITH PLAN OF CARE: Patient  PLAN FOR NEXT SESSION: ROM HEP; NMES; adjust splint as needed   Delana Meyer, OT 09/19/2023, 3:10 PM

## 2023-09-26 ENCOUNTER — Ambulatory Visit: Payer: 59 | Admitting: Physical Therapy

## 2023-09-26 ENCOUNTER — Ambulatory Visit: Payer: 59 | Admitting: Occupational Therapy

## 2023-09-27 ENCOUNTER — Ambulatory Visit (HOSPITAL_COMMUNITY)
Admission: RE | Admit: 2023-09-27 | Discharge: 2023-09-27 | Disposition: A | Discharge status: Home / Self Care | Payer: 59 | Source: Ambulatory Visit | Attending: Nephrology | Admitting: Nephrology

## 2023-09-27 VITALS — BP 125/62 | HR 75 | Temp 97.1°F | Resp 17

## 2023-09-27 DIAGNOSIS — N183 Chronic kidney disease, stage 3 unspecified: Secondary | ICD-10-CM | POA: Diagnosis not present

## 2023-09-27 LAB — RENAL FUNCTION PANEL
Albumin: 3.9 g/dL (ref 3.5–5.0)
Anion gap: 10 (ref 5–15)
BUN: 57 mg/dL — ABNORMAL HIGH (ref 6–20)
CO2: 18 mmol/L — ABNORMAL LOW (ref 22–32)
Calcium: 10.1 mg/dL (ref 8.9–10.3)
Chloride: 111 mmol/L (ref 98–111)
Creatinine, Ser: 6.59 mg/dL — ABNORMAL HIGH (ref 0.61–1.24)
GFR, Estimated: 9 mL/min — ABNORMAL LOW (ref >60)
Glucose, Bld: 99 mg/dL (ref 70–99)
Phosphorus: 4 mg/dL (ref 2.5–4.6)
Potassium: 5 mmol/L (ref 3.5–5.1)
Sodium: 139 mmol/L (ref 135–145)

## 2023-09-27 LAB — CBC WITH DIFFERENTIAL/PLATELET
Abs Immature Granulocytes: 0.03 10*3/uL (ref 0.00–0.07)
Basophils Absolute: 0.1 10*3/uL (ref 0.0–0.1)
Basophils Relative: 1 %
Eosinophils Absolute: 0.3 10*3/uL (ref 0.0–0.5)
Eosinophils Relative: 3 %
HCT: 32.6 % — ABNORMAL LOW (ref 39.0–52.0)
Hemoglobin: 10.2 g/dL — ABNORMAL LOW (ref 13.0–17.0)
Immature Granulocytes: 0 %
Lymphocytes Relative: 26 %
Lymphs Abs: 2.4 10*3/uL (ref 0.7–4.0)
MCH: 27.1 pg (ref 26.0–34.0)
MCHC: 31.3 g/dL (ref 30.0–36.0)
MCV: 86.7 fL (ref 80.0–100.0)
Monocytes Absolute: 0.6 10*3/uL (ref 0.1–1.0)
Monocytes Relative: 6 %
Neutro Abs: 6.1 10*3/uL (ref 1.7–7.7)
Neutrophils Relative %: 64 %
Platelets: 269 10*3/uL (ref 150–400)
RBC: 3.76 MIL/uL — ABNORMAL LOW (ref 4.22–5.81)
RDW: 15.4 % (ref 11.5–15.5)
WBC: 9.5 10*3/uL (ref 4.0–10.5)
nRBC: 0 % (ref 0.0–0.2)

## 2023-09-27 LAB — IRON AND TIBC
Iron: 70 ug/dL (ref 45–182)
Saturation Ratios: 28 % (ref 17.9–39.5)
TIBC: 252 ug/dL (ref 250–450)
UIBC: 182 ug/dL

## 2023-09-27 LAB — POCT HEMOGLOBIN-HEMACUE: Hemoglobin: 10.4 g/dL — ABNORMAL LOW (ref 13.0–17.0)

## 2023-09-27 LAB — FERRITIN: Ferritin: 386 ng/mL — ABNORMAL HIGH (ref 24–336)

## 2023-09-27 MED ORDER — EPOETIN ALFA-EPBX 10000 UNIT/ML IJ SOLN
INTRAMUSCULAR | Status: DC
Start: 2023-09-27 — End: 2023-09-28
  Filled 2023-09-27: qty 2

## 2023-09-27 MED ORDER — EPOETIN ALFA-EPBX 10000 UNIT/ML IJ SOLN
20000.0000 [IU] | INTRAMUSCULAR | Status: DC
  Administered 2023-09-27: 20000 [IU] via SUBCUTANEOUS

## 2023-09-30 ENCOUNTER — Other Ambulatory Visit: Payer: Self-pay | Admitting: Family Medicine

## 2023-09-30 DIAGNOSIS — E1122 Type 2 diabetes mellitus with diabetic chronic kidney disease: Secondary | ICD-10-CM

## 2023-10-03 ENCOUNTER — Ambulatory Visit: Payer: 59 | Admitting: Occupational Therapy

## 2023-10-03 ENCOUNTER — Ambulatory Visit: Payer: 59 | Admitting: Physical Therapy

## 2023-10-03 VITALS — BP 131/67 | HR 73

## 2023-10-03 DIAGNOSIS — R2681 Unsteadiness on feet: Secondary | ICD-10-CM

## 2023-10-03 DIAGNOSIS — M25642 Stiffness of left hand, not elsewhere classified: Secondary | ICD-10-CM

## 2023-10-03 DIAGNOSIS — R278 Other lack of coordination: Secondary | ICD-10-CM | POA: Diagnosis not present

## 2023-10-03 DIAGNOSIS — M25632 Stiffness of left wrist, not elsewhere classified: Secondary | ICD-10-CM | POA: Diagnosis not present

## 2023-10-03 DIAGNOSIS — M6281 Muscle weakness (generalized): Secondary | ICD-10-CM

## 2023-10-03 DIAGNOSIS — R29898 Other symptoms and signs involving the musculoskeletal system: Secondary | ICD-10-CM

## 2023-10-03 DIAGNOSIS — R262 Difficulty in walking, not elsewhere classified: Secondary | ICD-10-CM

## 2023-10-03 DIAGNOSIS — R2689 Other abnormalities of gait and mobility: Secondary | ICD-10-CM

## 2023-10-03 NOTE — Therapy (Signed)
 OUTPATIENT OCCUPATIONAL THERAPY NEURO TREATMENT  Patient Name: Tony Schmitt MRN: [id number] DOB:1971/07/21, 53 y.o., male Today's Date: 10/03/2023  PCP: Hoy Register, MD REFERRING PROVIDER: Hoy Register, MD   END OF SESSION:  OT End of Session - 10/03/23 1405     Visit Number 5    Number of Visits 9    Date for OT Re-Evaluation 10/11/23    Authorization Type [employer] Medicare - requires auth    Progress Note Due on Visit 9    OT Start Time 1405    OT Stop Time 1447    OT Time Calculation (min) 42 min    Activity Tolerance Patient tolerated treatment well    Behavior During Therapy WFL for tasks assessed/performed             Past Medical History:  Diagnosis Date   Anemia    Chronic kidney disease    Diabetes mellitus without complication (HCC)    GERD (gastroesophageal reflux disease)    Hypertension    Sleep apnea    Stroke [facility])    Past Surgical History:  Procedure Laterality Date   AV FISTULA PLACEMENT Right 04/30/2023   Procedure: INSERTION OF RIGHT UPPER ARM GORETEX  GRAFT;  Surgeon: Chuck Hint, MD;  Location[facility] OR;  Service: Vascular;  Laterality: Right;   BUBBLE STUDY  04/18/2020   Procedure: BUBBLE STUDY;  Surgeon: Quintella Reichert, MD;  Location: [facility] ENDOSCOPY;  Service: Cardiovascular;;   TEE WITHOUT CARDIOVERSION N/A 04/18/2020   Procedure: TRANSESOPHAGEAL ECHOCARDIOGRAM (TEE);  Surgeon: Quintella Reichert, MD;  Location: [facility] ENDOSCOPY;  Service: Cardiovascular;  Laterality: N/A;   Patient Active Problem List   Diagnosis Date Noted   Spastic hemiplegia, unspecified etiology, unspecified laterality (HCC) 01/07/2023   Acquired left foot drop 05/22/2022   Nephrotic syndrome    Peripheral edema    Abnormality of gait 05/23/2020   Type 2 diabetes mellitus with hyperglycemia, without long-term current use of insulin (HCC)    Thrombocytosis    Benign essential HTN    Acute blood loss anemia    Leukocytosis    Slow transit constipation    AKI  (acute kidney injury) (HCC)    Diabetes mellitus type 2 with complications, uncontrolled    CVA (cerebral vascular accident) (HCC) 04/22/2020   Acute left-sided weakness    Tobacco abuse    Essential hypertension    Acute CVA (cerebrovascular accident) (HCC) 04/16/2020   Hypertensive urgency 04/16/2020   Type 2 diabetes mellitus with vascular disease (HCC) 04/16/2020   CKD (chronic kidney disease) stage 3, GFR 30-59 ml/min (HCC) 04/23/2019   Hyperlipidemia 04/18/2019   Dyslipidemia associated with type 2 diabetes mellitus (HCC) 08/29/2018   Hypertension associated with type 2 diabetes mellitus (HCC) 08/29/2018    ONSET DATE: 08/06/2023 (Date of referral)  REFERRING DIAG: M62.81 (ICD-10-CM) - Muscle weakness (generalized)   THERAPY DIAG:  Muscle weakness (generalized)  Stiffness of left hand, not elsewhere classified  Stiffness of left wrist, not elsewhere classified  Other lack of coordination  Other symptoms and signs involving the musculoskeletal system  Rationale for Evaluation and Treatment: Rehabilitation  SUBJECTIVE:   SUBJECTIVE STATEMENT: His mother has to check his blood sugar using his monitor.   Pt accompanied by: self  PERTINENT HISTORY: CKD, DM, HTN, infarct stroke (2021)  Reason for Referral: Left upper extremity weakness   PRECAUTIONS: Fall and Other: fistula on R arm; latex allergy   WEIGHT BEARING RESTRICTIONS: No  PAIN:  Are you having pain?  No  FALLS: Has patient fallen in last 6 months? No  LIVING ENVIRONMENT: Lives with: lives with their family Lives in: House/apartment Stairs: Yes: External: 4 steps; can reach both Has following equipment at home: Single point cane, Walker - 2 wheeled, Wheelchair (manual), and shower chair   PLOF: Independent with gait, Independent with transfers, Requires assistive device for independence, and Needs assistance with ADLs  PATIENT GOALS: "get my hands to get a grip"   OBJECTIVE: blood sugar was  176 Note: Objective measures were completed at Evaluation unless otherwise noted.  HAND DOMINANCE: Right  ADLs: Eating: assistance to cut food Grooming: mod I UB Dressing: mod I LB Dressing: mod I Toileting: mod I Bathing: mod I  Equipment: Shower seat with back and Long handled sponge  IADLs: Shopping: mod I Light housekeeping: only cleans bathroom, no assistance Meal Prep: light meal prep mod I Community mobility: dependent Medication management: dependent Financial management: dependent Handwriting:  no change  MOBILITY STATUS: Needs Assist: SPC  ACTIVITY TOLERANCE: Activity tolerance: good to fair  FUNCTIONAL OUTCOME MEASURES: Quick Dash: 38.6 %   UPPER EXTREMITY ROM:     AROM Right (eval) Left (eval)  Shoulder flexion WNL 80  Shoulder abduction WNL 70  Elbow flexion WNL 90  Elbow extension WNL -30  Wrist flexion WNL slight  Wrist extension WNL slight  Wrist pronation WNL lacks active motion  Wrist supination WNL lacks active motion   Digit Composite Flexion WNL lacks active motion  Digit Composite Extension WNL lacks active motion  Digit Opposition WNL lacks active motion  (Blank rows = not tested)  LUE ER just above shoulder; IR just in front of hip  HAND FUNCTION: Lacks active motion  COORDINATION: lacks active motion  SENSATION: WFL  EDEMA: mild edema to L hand  MUSCLE TONE: LUE: Moderate  COGNITION: Overall cognitive status: History of cognitive impairments - at baseline  VISION: Subjective report: no change Baseline vision: Wears glasses all the time and Wears glasses for reading only Visual history:  n/a  PERCEPTION: Not tested  PRAXIS: Not tested  OBSERVATIONS: Pt appears well kept. Significant spasticity to LUE with minimal AROM. SPC for ambulation.                                                                                                                           TREATMENT DATE:   OT educated pt on importance of  checking blood sugar frequently. Unable to educate pt on how to do so without caregiver assistance due to safety concerns given monitor's limited features. Discussed CGM and recommended pt reach out to physician to see if this would be an appropriate option for him.  OT assisted pt with adjustment of resting hand splint. Therapist took picture of final strap adjustment and educated pt on additional replacement options as splint is too large for proper fit of L hand.   PATIENT EDUCATION: Education details: blood sugar management; resting hand splint Person educated: Patient Education  method: Explanation, Demonstration, and Verbal cues Education comprehension: verbalized understanding, returned demonstration, verbal cues required, and needs further education  HOME EXERCISE PROGRAM: 09/12/2023: Ways to Manage Spasticity  09/05/23 - Initial UE HEP/stretches - Access Code: [email or web address]  09/12/2023 - spasticity mangement  GOALS:  SHORT TERM GOALS: Target date: 09/12/2023    Pt will independently recall at least 3 ways to manage LUE spasticity. Baseline: Goal status: IN PROGRESS  2.  Pt will demonstrate volitional use of LUE (I.e. for stabilization) at least 25% of the time. Baseline: 0% - required cues, holds LUE in guarded position Goal status: IN Progress   LONG TERM GOALS: Target date: 10/11/2023  Patient will demonstrate updated LUE HEP with 25% verbal cues or less for proper execution. Baseline:  Goal status: IN Progress   2.  Pt will report no more than moderate difficulty cutting food using adaptive strategies/AD as needed. Baseline: unable Goal status: INITIAL   ASSESSMENT:  CLINICAL IMPRESSION: Patient verbalizes good understanding of recommendations this visit as needed to manage general health as well as LUE spasticity. Will explore additional resting hand splint options as needed and assess need for additional therapy visits.   PERFORMANCE DEFICITS: in functional skills  including ADLs, IADLs, coordination, tone, ROM, strength, Fine motor control, Gross motor control, decreased knowledge of use of DME, and UE functional use.   IMPAIRMENTS: are limiting patient from ADLs, IADLs, leisure, and social participation.   CO-MORBIDITIES: may have co-morbidities  that affects occupational performance. Patient will benefit from skilled OT to address above impairments and improve overall function.  REHAB POTENTIAL:  Fair chronicity of CVA   PLAN:  OT FREQUENCY: 1x/week  OT DURATION: 8 weeks  PLANNED INTERVENTIONS: 97168 OT Re-evaluation, 97535 self care/ADL training, 16606 therapeutic exercise, 97530 therapeutic activity, 97140 manual therapy, 97035 ultrasound, [id number] electrical stimulation (manual), [id number] Orthotics management and training, 30160 Splinting (initial encounter), [id number] Subsequent splinting/medication, passive range of motion, functional mobility training, patient/family education, and DME and/or AE instructions  RECOMMENDED OTHER SERVICES: N/A for this visit  CONSULTED AND AGREED WITH PLAN OF CARE: Patient  PLAN FOR NEXT SESSION: D/C vs extension; ROM HEP; NMES; adjust splint as needed  PCP- CGM; [facility]   Delana Meyer, OT 10/03/2023, 5:47 PM

## 2023-10-03 NOTE — Therapy (Signed)
 OUTPATIENT PHYSICAL THERAPY NEURO TREATMENT - RECERTIFICATION   Patient Name: Tony Schmitt MRN: [id number] DOB:[birth year], 53 y.o., male Today's Date: 10/03/2023   PCP: Hoy Register, MD REFERRING PROVIDER: Hoy Register, MD  END OF SESSION:    PT End of Session - 10/03/23 1318     Visit Number 7    Number of Visits 9    Date for PT Re-Evaluation 11/28/23   to allow for scheduling delays   Authorization Type [employer] Dual    PT Start Time 1315    PT Stop Time 1359    PT Time Calculation (min) 44 min    Equipment Utilized During Treatment Gait belt    Activity Tolerance Patient tolerated treatment well    Behavior During Therapy WFL for tasks assessed/performed              Past Medical History:  Diagnosis Date   Anemia    Chronic kidney disease    Diabetes mellitus without complication (HCC)    GERD (gastroesophageal reflux disease)    Hypertension    Sleep apnea    Stroke [facility])    Past Surgical History:  Procedure Laterality Date   AV FISTULA PLACEMENT Right 04/30/2023   Procedure: INSERTION OF RIGHT UPPER ARM GORETEX  GRAFT;  Surgeon: Chuck Hint, MD;  Location: [facility] [place] OR;  Service: Vascular;  Laterality: Right;   BUBBLE STUDY  04/18/2020   Procedure: BUBBLE STUDY;  Surgeon: Quintella Reichert, MD;  Location: [facility] ENDOSCOPY;  Service: Cardiovascular;;   TEE WITHOUT CARDIOVERSION N/A 04/18/2020   Procedure: TRANSESOPHAGEAL ECHOCARDIOGRAM (TEE);  Surgeon: Quintella Reichert, MD;  Location: [facility] ENDOSCOPY;  Service: Cardiovascular;  Laterality: N/A;   Patient Active Problem List   Diagnosis Date Noted   Spastic hemiplegia, unspecified etiology, unspecified laterality (HCC) 01/07/2023   Acquired left foot drop 05/22/2022   Nephrotic syndrome    Peripheral edema    Abnormality of gait 05/23/2020   Type 2 diabetes mellitus with hyperglycemia, without long-term current use of insulin (HCC)    Thrombocytosis    Benign essential HTN    Acute blood loss anemia     Leukocytosis    Slow transit constipation    AKI (acute kidney injury) (HCC)    Diabetes mellitus type 2 with complications, uncontrolled    CVA (cerebral vascular accident) (HCC) 04/22/2020   Acute left-sided weakness    Tobacco abuse    Essential hypertension    Acute CVA (cerebrovascular accident) (HCC) 04/16/2020   Hypertensive urgency 04/16/2020   Type 2 diabetes mellitus with vascular disease (HCC) 04/16/2020   CKD (chronic kidney disease) stage 3, GFR 30-59 ml/min (HCC) 04/23/2019   Hyperlipidemia 04/18/2019   Dyslipidemia associated with type 2 diabetes mellitus (HCC) 08/29/2018   Hypertension associated with type 2 diabetes mellitus (HCC) 08/29/2018    ONSET DATE: 07/18/2023  REFERRING DIAG: G81.10 (ICD-10-CM) - Spastic hemiplegia, unspecified etiology, unspecified laterality (HCC)  THERAPY DIAG:  Muscle weakness (generalized)  Unsteadiness on feet  Other abnormalities of gait and mobility  Difficulty in walking, not elsewhere classified  Rationale for Evaluation and Treatment: Rehabilitation  SUBJECTIVE:  SUBJECTIVE STATEMENT: Pt denies any acute changes since last visit, no complaints of pain. Pt reports his BG was 108 earlier today, mom went to go get his monitor after dropping him off for PT.  Pt accompanied by: family member mom (drops him off)  PERTINENT HISTORY: CKD, DM, HTN, stroke (2021)  PAIN:  Are you having pain? No  PRECAUTIONS: Fall and Other: fistula on R arm  RED FLAGS: None   WEIGHT BEARING RESTRICTIONS: No  FALLS: Has patient fallen in last 6 months? No  LIVING ENVIRONMENT: Lives with: lives with their family Lives in: House/apartment Stairs: Yes: External: 4 steps; can reach both Has following equipment at home: Single point cane, Walker - 2  wheeled, Wheelchair (manual), and shower chair  PLOF: Independent with gait, Independent with transfers, Requires assistive device for independence, and Needs assistance with ADLs  PATIENT GOALS: "get my hands to get a grip" "work towards being able to get a kidney"  OBJECTIVE:  Note: Objective measures were completed at Evaluation unless otherwise noted.  DIAGNOSTIC FINDINGS: None relevant to this POC  COGNITION: Overall cognitive status: Within functional limits for tasks assessed   SENSATION: Gets N/T in L hemibody every 3 months or so  EDEMA:  Swelling in L hand  POSTURE: rounded shoulders, forward head, and posterior pelvic tilt  LOWER EXTREMITY ROM:     Active  Right Eval Left Eval  Hip flexion    Hip extension    Hip abduction    Hip adduction    Hip internal rotation    Hip external rotation    Knee flexion    Knee extension    Ankle dorsiflexion    Ankle plantarflexion    Ankle inversion    Ankle eversion     (Blank rows = not tested)  LOWER EXTREMITY MMT:    MMT Right Eval Left Eval  Hip flexion 4 3  Hip extension    Hip abduction    Hip adduction    Hip internal rotation    Hip external rotation    Knee flexion 5 2-  Knee extension 5 2-  Ankle dorsiflexion 5 3  Ankle plantarflexion    Ankle inversion    Ankle eversion    (Blank rows = not tested)  BED MOBILITY:  Mod I per pt report, utilizes bedrail and has an adjustable bed  TRANSFERS: Assistive device utilized: None  Sit to stand: Modified independence Stand to sit: Modified independence Chair to chair: Modified independence  GAIT: Gait pattern: decreased arm swing- Left, decreased hip/knee flexion- Left, decreased ankle dorsiflexion- Left, circumduction- Left, lateral lean- Right, and abducted- Left Distance walked: various clinic distances Assistive device utilized: Single point cane Level of assistance: Modified independence Comments: L hemiparesis                                                                              TREATMENT:  Vitals:   10/03/23 1325  BP: 131/67  Pulse: 73   Assessed BP prior to intervention in sitting on LUE at rest.  Patient's mom does drop off his BG monitor during PT session but this therapist unable to obtain a reading with use of patient's personal device. Pt  with no signs/symptoms of low blood sugar, able to work on assessing his BG during OT session following this PT session, found to be 186.  Discussed plan for patient to reach out to [facility] to have his current AFO adjusted due to likely muscle atrophy of LLE leading to poor fit of his current brace. Pt reports he has the contact information for [facility], plans on reaching out to them to setup an appointment to have his current brace adjusted. Pt agreeable to schedule additional PT appointments following his brace adjustment.   For LTG assessment:  [facility] PT Assessment - 10/03/23 1330       Ambulation/Gait   Gait velocity 32.8 ft over 21 sec = 1.56 ft/sec      Standardized Balance Assessment   Standardized Balance Assessment Berg Balance Test;Timed Up and Go Test      Berg Balance Test   Sit to Stand Able to stand without using hands and stabilize independently    Standing Unsupported Able to stand safely 2 minutes    Sitting with Back Unsupported but Feet Supported on Floor or Stool Able to sit safely and securely 2 minutes    Stand to Sit Sits safely with minimal use of hands    Transfers Able to transfer safely, minor use of hands    Standing Unsupported with Eyes Closed Able to stand 10 seconds safely    Standing Unsupported with Feet Together Able to place feet together independently and stand 1 minute safely    From Standing, Reach Forward with Outstretched Arm Can reach forward >12 cm safely (5")    From Standing Position, Pick up Object from Floor Able to pick up shoe, needs supervision    From Standing Position, Turn to Look Behind Over each Shoulder Looks behind  one side only/other side shows less weight shift    Turn 360 Degrees Able to turn 360 degrees safely in 4 seconds or less    Standing Unsupported, Alternately Place Feet on Step/Stool Able to complete >2 steps/needs minimal assist    Standing Unsupported, One Foot in Front Able to take small step independently and hold 30 seconds    Standing on One Leg Able to lift leg independently and hold 5-10 seconds    Total Score 47    Berg comment: 47/56, moderate fall risk      Timed Up and Go Test   TUG Normal TUG    Normal TUG (seconds) 19.8   with SPC            PATIENT EDUCATION: Education details: goals of tasks today, check blood sugar before therapy sessions at home so OT/PT have accurate data at onset of session, and continue HEP, PT POC with plan to follow-up in about 4 weeks following AFO adjustments Person educated: Patient Education method: Explanation, Demonstration, Tactile cues, and Verbal cues Education comprehension: verbalized understanding, returned demonstration, tactile cues required, and needs further education  HOME EXERCISE PROGRAM: Access Code: [email or web address] URL: [email or web address] Date: 08/15/2023 Prepared by: Camille Bal  Exercises - Staggered Sit-to-Stand  - 1 x daily - 7 x weekly - 3 sets - 8-10 reps - Side to Side Weight Shift with Counter Support  - 1 x daily - 7 x weekly - 3 sets - 10 reps - Staggered Stance Forward Backward Weight Shift with Counter Support  - 1 x daily - 7 x weekly - 3 sets - 10 reps   GOALS: Goals reviewed with patient? Yes  SHORT TERM GOALS: Target date:  09/02/2023  Pt will be independent with initial HEP for improved strength, balance, transfers and gait. Baseline: reviewed 1/30 Goal status: IN PROGRESS  2.  Pt will improve gait velocity to at least 1.75 ft/sec for improved gait efficiency and performance at mod I level  Baseline: 1.32 ft/sec mod I SPC (12/30), 1.45 ft/sec mod I no AD (1/30) Goal status: IN  PROGRESS  3.  Pt will improve normal TUG to less than or equal to 19 seconds for improved functional mobility and decreased fall risk. Baseline: 23.81 sec (12/30), 23.15 sec (1/30) Goal status: IN PROGRESS  4.  Pt will improve Berg score to 39/56 for decreased fall risk Baseline: 34/56 (12/30), 45/56 (1/30) Goal status: MET  LONG TERM GOALS: Target date: 10/03/2023  Pt will be independent with final HEP for improved strength, balance, transfers and gait. Baseline:  Goal status: IN PROGRESS  2.  Pt will improve gait velocity to at least 1.75 ft/sec for improved gait efficiency and performance at mod I level  Baseline: 1.32 ft/sec mod I SPC (12/30), 1.45 ft/sec mod I no AD (1/30), 1.56 ft/sec mod I SPC (2/27) Goal status: NOT MET  3.  Pt will improve normal TUG to less than or equal to 19 seconds for improved functional mobility and decreased fall risk. Baseline: 23.81 sec (12/30), 23.15 sec (1/30), 19 sec with SPC (2/27)  Goal status: MET  4.  Pt will improve Berg score to 44/56 for decreased fall risk Baseline: 34/56 (12/30), 45/56 (1/30), 47/56 (2/27) Goal status: MET  NEW SHORT TERM GOALS: Target date: 10/31/2023  Pt will reach out to [facility] clinic for L AFO adjustments and return to PT for gait assessments with newly adjusted AFO. Baseline:  Goal status: INITIAL   NEW LONG TERM GOALS: Target date: 11/28/2023   Pt will demonstrate good awareness of how to don/doff his L AFO at mod I level. Baseline:  Goal status: INITIAL  Pt will be independent with final HEP for improved strength, balance, transfers and gait. Baseline:  Goal status: IN PROGRESS    ASSESSMENT:  CLINICAL IMPRESSION: Emphasis of skilled PT session on assessing LTG for recertification of PT services this date. Pt has met 2/4 LTG assessed due to improving his score on the Berg from 34/56 initially to 47/56 this date and improving his score on the TUG from 23.8 sec initially to 19 sec this date. He does  remain a moderate-high fall risk based on these scores. Additionally, he did improve his gait speed from 1.32 ft/sec initially to 1.56 ft/sec this date but did not quite demonstrate enough improvement to meet his LTG. Additionally, he has been working on walking at home but not specifically his HEP and can benefit from a review and/or adjustment of his current HEP to better suit his needs. Pt can benefit from 2 more skilled PT sessions to work on assessing his L AFO once he has adjustments made at [facility] to ensure safe wear schedule and ability to safely don/doff the brace independently. Continue POC.   OBJECTIVE IMPAIRMENTS: Abnormal gait, decreased balance, decreased coordination, decreased mobility, difficulty walking, decreased ROM, decreased strength, increased muscle spasms, impaired sensation, impaired tone, impaired UE functional use, improper body mechanics, and postural dysfunction.   ACTIVITY LIMITATIONS: carrying, lifting, bending, stairs, transfers, dressing, and reach over head  PARTICIPATION LIMITATIONS: driving and community activity  PERSONAL FACTORS: Time since onset of injury/illness/exacerbation, Transportation, and 3+ comorbidities:    CKD, DM, HTN, strokeare also affecting patient's functional outcome.  REHAB POTENTIAL: Fair chronicity of CVA  CLINICAL DECISION MAKING: Stable/uncomplicated  EVALUATION COMPLEXITY: Low  PLAN:  PT FREQUENCY: 1x/week  PT DURATION: 8 weeks+ 8 weeks (to allow for 1 month break to get AFO adjusted)  PLANNED INTERVENTIONS: 82956- PT Re-evaluation, 97110-Therapeutic exercises, 97530- Therapeutic activity, 97112- Neuromuscular re-education, 97535- Self Care, 21308- Manual therapy, [phone]- Gait training, [phone]- Orthotic Fit/training, [phone]- Aquatic Therapy, [phone]- Electrical stimulation (manual), Patient/Family education, Balance training, Stair training, Taping, Dry Needling, Joint mobilization, Spinal mobilization, DME instructions, Cryotherapy,  and Moist heat  PLAN FOR NEXT SESSION: add to HEP to work on functional strengthening of LLE (tall kneel? Staggered stance tasks), did he get AFO adjustments at [facility]?  Peter Congo, PT Peter Congo, PT, DPT, CSRS    10/03/2023, 3:20 PM

## 2023-10-04 ENCOUNTER — Other Ambulatory Visit: Payer: Self-pay | Admitting: Pharmacist

## 2023-10-04 ENCOUNTER — Telehealth: Payer: Self-pay

## 2023-10-04 ENCOUNTER — Other Ambulatory Visit: Payer: Self-pay

## 2023-10-04 ENCOUNTER — Encounter (HOSPITAL_COMMUNITY): Payer: Self-pay

## 2023-10-04 DIAGNOSIS — E1159 Type 2 diabetes mellitus with other circulatory complications: Secondary | ICD-10-CM

## 2023-10-04 MED ORDER — FREESTYLE LIBRE 3 READER DEVI: 0 refills | Status: AC

## 2023-10-04 MED ORDER — FREESTYLE LIBRE 3 SENSOR MISC: 6 refills | Status: AC

## 2023-10-04 NOTE — Telephone Encounter (Signed)
 Pharmacy Patient Advocate Encounter   Received notification from Physician's Office that prior authorization for FREESTYLE LIBRE 3 SENSORS AND RECEIVER is required/requested.   Insurance verification completed.   The patient is insured through [employer] [place] .   Per test claim: PA required; PA submitted to above mentioned insurance via CoverMyMeds Key/confirmation #/EOC [id number] Status is pending

## 2023-10-04 NOTE — Telephone Encounter (Signed)
-----   Message from Horald Pollen Ausdall sent at 10/04/2023 11:20 AM EST ----- Regarding: RE: Blood Sugar Monitoring Good morning, all.   We will attempt a PA for CGM coverage given his clinical scenario.   Tresa Endo,   Looping you in. Would you be able to submit a PA for CGM for this patient? I will send the rxns for Libre 3. Although not on injectable therapy, he has hemiplegia to his L side d/t his hx of stroke and is unable to perform finger stick glucoses. Is there a way to include documentation of this in his PA to see if insurance will approve? ----- Message ----- From: Hoy Register, MD Sent: 10/04/2023  11:06 AM EST To: Drucilla Chalet, RPH-CPP; # Subject: RE: Blood Sugar Monitoring                     Hi, He is just on oral medications which might pose a challenge to CGM coverage.  Looping in our Pharmacist Franky Macho to see if we can obtain coverage. Thanks, -Dr. Alvis Lemmings ----- Message ----- From: Delana Meyer, OT Sent: 10/03/2023   2:40 PM EST To: Hoy Register, MD Subject: Blood Sugar Monitoring                         Hello Dr. Alvis Lemmings,   This pt is unable to check his blood sugar independently due to his hemiplegia. I was wondering if he would be a candidate for a CGM.   We are checking his blood sugar when he comes into therapy, but his mother usually has to do this for him. Unfortunately, there is not a safe way for him to do this one handed/with limited assistance of his LUE.   Please let me know what you think.   Thank you!  Shelbie Proctor, OTR/L 10/03/2023 [facility] Phone: [phone] Fax: [phone]

## 2023-10-07 DIAGNOSIS — I120 Hypertensive chronic kidney disease with stage 5 chronic kidney disease or end stage renal disease: Secondary | ICD-10-CM | POA: Diagnosis not present

## 2023-10-07 DIAGNOSIS — E872 Acidosis, unspecified: Secondary | ICD-10-CM | POA: Diagnosis not present

## 2023-10-07 DIAGNOSIS — N189 Chronic kidney disease, unspecified: Secondary | ICD-10-CM | POA: Diagnosis not present

## 2023-10-07 DIAGNOSIS — D631 Anemia in chronic kidney disease: Secondary | ICD-10-CM | POA: Diagnosis not present

## 2023-10-07 DIAGNOSIS — E1122 Type 2 diabetes mellitus with diabetic chronic kidney disease: Secondary | ICD-10-CM | POA: Diagnosis not present

## 2023-10-07 DIAGNOSIS — I129 Hypertensive chronic kidney disease with stage 1 through stage 4 chronic kidney disease, or unspecified chronic kidney disease: Secondary | ICD-10-CM | POA: Diagnosis not present

## 2023-10-07 DIAGNOSIS — N2581 Secondary hyperparathyroidism of renal origin: Secondary | ICD-10-CM | POA: Diagnosis not present

## 2023-10-07 DIAGNOSIS — N185 Chronic kidney disease, stage 5: Secondary | ICD-10-CM | POA: Diagnosis not present

## 2023-10-08 LAB — LAB REPORT - SCANNED: EGFR: 9

## 2023-10-10 ENCOUNTER — Other Ambulatory Visit: Payer: Self-pay

## 2023-10-10 ENCOUNTER — Ambulatory Visit: Payer: Self-pay | Attending: Family Medicine | Admitting: Occupational Therapy

## 2023-10-10 DIAGNOSIS — R29818 Other symptoms and signs involving the nervous system: Secondary | ICD-10-CM | POA: Diagnosis not present

## 2023-10-10 DIAGNOSIS — R29898 Other symptoms and signs involving the musculoskeletal system: Secondary | ICD-10-CM | POA: Insufficient documentation

## 2023-10-10 DIAGNOSIS — M25642 Stiffness of left hand, not elsewhere classified: Secondary | ICD-10-CM | POA: Diagnosis not present

## 2023-10-10 DIAGNOSIS — M6281 Muscle weakness (generalized): Secondary | ICD-10-CM | POA: Diagnosis not present

## 2023-10-10 DIAGNOSIS — R278 Other lack of coordination: Secondary | ICD-10-CM | POA: Diagnosis not present

## 2023-10-10 DIAGNOSIS — M25632 Stiffness of left wrist, not elsewhere classified: Secondary | ICD-10-CM | POA: Diagnosis not present

## 2023-10-10 NOTE — Therapy (Signed)
 OUTPATIENT OCCUPATIONAL THERAPY NEURO TREATMENT & PROGRESS NOTE  Patient Name: Tony Schmitt MRN: [id number] DOB:10/18/1970, 53 y.o., male Today's Date: 10/10/2023  PCP: Hoy Register, MD REFERRING PROVIDER: Hoy Register, MD   END OF SESSION:  OT End of Session - 10/10/23 1239     Visit Number 6    Number of Visits 10    Date for OT Re-Evaluation 11/08/23    Authorization Type [employer] Medicare - requires auth    Progress Note Due on Visit 10    OT Start Time 1239    OT Stop Time 1317    OT Time Calculation (min) 38 min    Equipment Utilized During Treatment Soft Pro resting hand splint    Activity Tolerance Patient tolerated treatment well    Behavior During Therapy WFL for tasks assessed/performed;Restless             Past Medical History:  Diagnosis Date   Anemia    Chronic kidney disease    Diabetes mellitus without complication (HCC)    GERD (gastroesophageal reflux disease)    Hypertension    Sleep apnea    Stroke [facility])    Past Surgical History:  Procedure Laterality Date   AV FISTULA PLACEMENT Right 04/30/2023   Procedure: INSERTION OF RIGHT UPPER ARM GORETEX  GRAFT;  Surgeon: Chuck Hint, MD;  Location: [facility] OR;  Service: Vascular;  Laterality: Right;   BUBBLE STUDY  04/18/2020   Procedure: BUBBLE STUDY;  Surgeon: Quintella Reichert, MD;  Location: [facility] ENDOSCOPY;  Service: Cardiovascular;;   TEE WITHOUT CARDIOVERSION N/A 04/18/2020   Procedure: TRANSESOPHAGEAL ECHOCARDIOGRAM (TEE);  Surgeon: Quintella Reichert, MD;  Location: [facility] ENDOSCOPY;  Service: Cardiovascular;  Laterality: N/A;   Patient Active Problem List   Diagnosis Date Noted   Spastic hemiplegia, unspecified etiology, unspecified laterality (HCC) 01/07/2023   Acquired left foot drop 05/22/2022   Nephrotic syndrome    Peripheral edema    Abnormality of gait 05/23/2020   Type 2 diabetes mellitus with hyperglycemia, without long-term current use of insulin (HCC)    Thrombocytosis    Benign  essential HTN    Acute blood loss anemia    Leukocytosis    Slow transit constipation    AKI (acute kidney injury) (HCC)    Diabetes mellitus type 2 with complications, uncontrolled    CVA (cerebral vascular accident) (HCC) 04/22/2020   Acute left-sided weakness    Tobacco abuse    Essential hypertension    Acute CVA (cerebrovascular accident) (HCC) 04/16/2020   Hypertensive urgency 04/16/2020   Type 2 diabetes mellitus with vascular disease (HCC) 04/16/2020   CKD (chronic kidney disease) stage 3, GFR 30-59 ml/min (HCC) 04/23/2019   Hyperlipidemia 04/18/2019   Dyslipidemia associated with type 2 diabetes mellitus (HCC) 08/29/2018   Hypertension associated with type 2 diabetes mellitus (HCC) 08/29/2018    ONSET DATE: 08/06/2023 (Date of referral)  REFERRING DIAG: M62.81 (ICD-10-CM) - Muscle weakness (generalized)   THERAPY DIAG:  Stiffness of left hand, not elsewhere classified  Stiffness of left wrist, not elsewhere classified  Muscle weakness (generalized)  Other lack of coordination  Other symptoms and signs involving the nervous system  Rationale for Evaluation and Treatment: Rehabilitation  SUBJECTIVE:   SUBJECTIVE STATEMENT: Pt brought his Soft Pro resting hand splint to therapy today.  He has an appt next Tuesday for his foot brace and is in agreement with continued OT services to help with proper fit of splint.    Pt accompanied by: self  PERTINENT HISTORY: CKD, DM, HTN, infarct stroke (2021)  Reason for Referral: Left upper extremity weakness   PRECAUTIONS: Fall and Other: fistula on R arm; latex allergy   WEIGHT BEARING RESTRICTIONS: No  PAIN:  Are you having pain? No  FALLS: Has patient fallen in last 6 months? No  LIVING ENVIRONMENT: Lives with: lives with their family Lives in: House/apartment Stairs: Yes: External: 4 steps; can reach both Has following equipment at home: Single point cane, Walker - 2 wheeled, Wheelchair (manual), and shower  chair   PLOF: Independent with gait, Independent with transfers, Requires assistive device for independence, and Needs assistance with ADLs  PATIENT GOALS: "get my hands to get a grip"   OBJECTIVE: blood sugar was 176 Note: Objective measures were completed at Evaluation unless otherwise noted.  HAND DOMINANCE: Right  ADLs: Eating: assistance to cut food Grooming: mod I UB Dressing: mod I LB Dressing: mod I Toileting: mod I Bathing: mod I  Equipment: Shower seat with back and Long handled sponge  IADLs: Shopping: mod I Light housekeeping: only cleans bathroom, no assistance Meal Prep: light meal prep mod I Community mobility: dependent Medication management: dependent Financial management: dependent Handwriting:  no change  MOBILITY STATUS: Needs Assist: SPC  ACTIVITY TOLERANCE: Activity tolerance: good to fair  FUNCTIONAL OUTCOME MEASURES: Quick Dash: 38.6 %   UPPER EXTREMITY ROM:     AROM Right (eval) Left (eval)  Shoulder flexion WNL 80  Shoulder abduction WNL 70  Elbow flexion WNL 90  Elbow extension WNL -30  Wrist flexion WNL slight  Wrist extension WNL slight  Wrist pronation WNL lacks active motion  Wrist supination WNL lacks active motion   Digit Composite Flexion WNL lacks active motion  Digit Composite Extension WNL lacks active motion  Digit Opposition WNL lacks active motion  (Blank rows = not tested)  LUE ER just above shoulder; IR just in front of hip  HAND FUNCTION: Lacks active motion  COORDINATION: lacks active motion  SENSATION: WFL  EDEMA: mild edema to L hand  MUSCLE TONE: LUE: Moderate  COGNITION: Overall cognitive status: History of cognitive impairments - at baseline  VISION: Subjective report: no change Baseline vision: Wears glasses all the time and Wears glasses for reading only Visual history:  n/a  PERCEPTION: Not tested  PRAXIS: Not tested  OBSERVATIONS: Pt appears well kept. Significant spasticity to  LUE with minimal AROM. SPC for ambulation.                                                                                                                           TREATMENT DATE:    Therapeutic Exercises: Pt engaged in PROM and AAROM activities to address loss of joint motion in L arm especially wrist and digits through stretching and positioning to increase tissue extensibility and increase muscular relaxation with ultimate goal of improved ROM and decreased pain for contracture management and progression of splinting.  Pt engaged in self performance of PROM and  used pillow to support arm/splint for him to stretch joints against.  He reports if the room is warmer - his arm stretches better but he is not able to use heat as a modality. Orthotic management: OT assisted pt with adjustment of resting hand splint. Pt demonstrated how he applies his splint and is noted not to fasten the straps tight enough o hold it in place effectively.  He has the picture on his phone of the of final strap adjustment last week but OTR notes need for additional Velcro hook to adhere straps more securely as the splint is a bit too large for him.  Appropriate replacement options searched on pt's [employer] account.  He left the splint cover with OTR to sew additional velcro to and he took the splint to allow the replacement cover to be donned at home.   PATIENT EDUCATION: Education details: Resting hand splint Person educated: Patient Education method: Explanation, Demonstration, and Verbal cues Education comprehension: verbalized understanding, returned demonstration, verbal cues required, and needs further education  HOME EXERCISE PROGRAM: 09/12/2023: Ways to Manage Spasticity  09/05/23 - Initial UE HEP/stretches - Access Code: [email or web address]  09/12/2023 - spasticity mangement  GOALS:  Continuing GOALS: Target date: 11/08/2023    Pt will independently recall at least 3 ways to manage LUE spasticity. Baseline: Goal status: IN  PROGRESS Not consistently using resting hand splint at this time.  2.  Pt will demonstrate volitional use of LUE (I.e. for stabilization) at least 25% of the time. Baseline: 0% - required cues, holds LUE in guarded position Goal status: IN Progress  Pt able to pick up hand without using R hand to pick it up but not using it for activities without therapist's guidance  3. Patient will demonstrate updated LUE HEP with 25% verbal cues or less for proper execution. Baseline:  Goal status: IN Progress   4.  Pt will report no more than moderate difficulty cutting food using adaptive strategies/AD as needed. Baseline: unable Goal status: IN Progress   ASSESSMENT:  CLINICAL IMPRESSION: Patient is a 53 y.o. male who was seen today for occupational therapy treatment for L hemiplegia. This 6th progress note is for dates: 08/15/23 to 10/10/2023. Pt has made progress in 4 established goals but has not met any fully at this time. Pt making progress towards goals as expected and continues to benefit from skilled OT services in the outpatient setting to work towards remaining goals or until max rehab potential is met. Patient verbalizes good understanding of recommendations this visit as needed to manage LUE spasticity. Will explore additional resting hand splint options as needed.   PERFORMANCE DEFICITS: in functional skills including ADLs, IADLs, coordination, tone, ROM, strength, Fine motor control, Gross motor control, decreased knowledge of use of DME, and UE functional use.   IMPAIRMENTS: are limiting patient from ADLs, IADLs, leisure, and social participation.   CO-MORBIDITIES: may have co-morbidities  that affects occupational performance. Patient will benefit from skilled OT to address above impairments and improve overall function.  REHAB POTENTIAL:  Fair chronicity of CVA   PLAN:  OT FREQUENCY: 1x/week  OT DURATION: additional 4 weeks  PLANNED INTERVENTIONS: 97168 OT Re-evaluation, 97535  self care/ADL training, 84696 therapeutic exercise, 97530 therapeutic activity, 97140 manual therapy, 97035 ultrasound, [id number] electrical stimulation (manual), [id number] Orthotics management and training, 29528 Splinting (initial encounter), [id number] Subsequent splinting/medication, passive range of motion, functional mobility training, patient/family education, and DME and/or AE instructions  RECOMMENDED OTHER SERVICES: N/A for this visit  CONSULTED AND  AGREED WITH PLAN OF CARE: Patient  PLAN FOR NEXT SESSION:  A/AROM for table top bimanual task HEP;  Adaptive equipment to cut food Adjust splint as needed    Victorino Sparrow, OT 10/10/2023, 2:39 PM

## 2023-10-12 ENCOUNTER — Other Ambulatory Visit: Payer: Self-pay | Admitting: Family Medicine

## 2023-10-12 DIAGNOSIS — E1122 Type 2 diabetes mellitus with diabetic chronic kidney disease: Secondary | ICD-10-CM

## 2023-10-16 ENCOUNTER — Ambulatory Visit: Payer: Self-pay | Admitting: Occupational Therapy

## 2023-10-16 ENCOUNTER — Other Ambulatory Visit: Payer: Self-pay

## 2023-10-16 DIAGNOSIS — M25642 Stiffness of left hand, not elsewhere classified: Secondary | ICD-10-CM

## 2023-10-16 DIAGNOSIS — R29818 Other symptoms and signs involving the nervous system: Secondary | ICD-10-CM | POA: Diagnosis not present

## 2023-10-16 DIAGNOSIS — M6281 Muscle weakness (generalized): Secondary | ICD-10-CM

## 2023-10-16 DIAGNOSIS — R29898 Other symptoms and signs involving the musculoskeletal system: Secondary | ICD-10-CM | POA: Diagnosis not present

## 2023-10-16 DIAGNOSIS — M25632 Stiffness of left wrist, not elsewhere classified: Secondary | ICD-10-CM | POA: Diagnosis not present

## 2023-10-16 DIAGNOSIS — R278 Other lack of coordination: Secondary | ICD-10-CM | POA: Diagnosis not present

## 2023-10-16 NOTE — Therapy (Unsigned)
 OUTPATIENT OCCUPATIONAL THERAPY NEURO TREATMENT  Patient Name: Tony Schmitt MRN: [id number] DOB:1971/03/29, 53 y.o., male Today's Date: 10/16/2023  PCP: Hoy Register, MD REFERRING PROVIDER: Hoy Register, MD   END OF SESSION:  OT End of Session - 10/16/23 1406     Visit Number 7    Number of Visits 10    Date for OT Re-Evaluation 11/08/23    Authorization Type [employer] Medicare - requires auth    Progress Note Due on Visit 10    OT Start Time 1408    OT Stop Time 1446    OT Time Calculation (min) 38 min    Equipment Utilized During Treatment --    Activity Tolerance Patient tolerated treatment well    Behavior During Therapy [facility] for tasks assessed/performed;Restless             Past Medical History:  Diagnosis Date   Anemia    Chronic kidney disease    Diabetes mellitus without complication (HCC)    GERD (gastroesophageal reflux disease)    Hypertension    Sleep apnea    Stroke [facility])    Past Surgical History:  Procedure Laterality Date   AV FISTULA PLACEMENT Right 04/30/2023   Procedure: INSERTION OF RIGHT UPPER ARM GORETEX  GRAFT;  Surgeon: Chuck Hint, MD;  Location: [facility] OR;  Service: Vascular;  Laterality: Right;   BUBBLE STUDY  04/18/2020   Procedure: BUBBLE STUDY;  Surgeon: Quintella Reichert, MD;  Location: [facility] ENDOSCOPY;  Service: Cardiovascular;;   TEE WITHOUT CARDIOVERSION N/A 04/18/2020   Procedure: TRANSESOPHAGEAL ECHOCARDIOGRAM (TEE);  Surgeon: Quintella Reichert, MD;  Location: [facility] ENDOSCOPY;  Service: Cardiovascular;  Laterality: N/A;   Patient Active Problem List   Diagnosis Date Noted   Spastic hemiplegia, unspecified etiology, unspecified laterality (HCC) 01/07/2023   Acquired left foot drop 05/22/2022   Nephrotic syndrome    Peripheral edema    Abnormality of gait 05/23/2020   Type 2 diabetes mellitus with hyperglycemia, without long-term current use of insulin (HCC)    Thrombocytosis    Benign essential HTN    Acute blood loss anemia     Leukocytosis    Slow transit constipation    AKI (acute kidney injury) (HCC)    Diabetes mellitus type 2 with complications, uncontrolled    CVA (cerebral vascular accident) (HCC) 04/22/2020   Acute left-sided weakness    Tobacco abuse    Essential hypertension    Acute CVA (cerebrovascular accident) (HCC) 04/16/2020   Hypertensive urgency 04/16/2020   Type 2 diabetes mellitus with vascular disease (HCC) 04/16/2020   CKD (chronic kidney disease) stage 3, GFR 30-59 ml/min (HCC) 04/23/2019   Hyperlipidemia 04/18/2019   Dyslipidemia associated with type 2 diabetes mellitus (HCC) 08/29/2018   Hypertension associated with type 2 diabetes mellitus (HCC) 08/29/2018    ONSET DATE: 08/06/2023 (Date of referral)  REFERRING DIAG: M62.81 (ICD-10-CM) - Muscle weakness (generalized)   THERAPY DIAG:  Stiffness of left hand, not elsewhere classified  Stiffness of left wrist, not elsewhere classified  Other symptoms and signs involving the musculoskeletal system  Muscle weakness (generalized)  Other symptoms and signs involving the nervous system  Other lack of coordination  Rationale for Evaluation and Treatment: Rehabilitation  SUBJECTIVE:   SUBJECTIVE STATEMENT: He brought in the form for his resting hand splint.   Pt accompanied by: self  PERTINENT HISTORY: CKD, DM, HTN, infarct stroke (2021)  Reason for Referral: Left upper extremity weakness   PRECAUTIONS: Fall and Other: fistula on R  arm; latex allergy   WEIGHT BEARING RESTRICTIONS: No  PAIN:  Are you having pain? No  FALLS: Has patient fallen in last 6 months? No  LIVING ENVIRONMENT: Lives with: lives with their family Lives in: House/apartment Stairs: Yes: External: 4 steps; can reach both Has following equipment at home: Single point cane, Walker - 2 wheeled, Wheelchair (manual), and shower chair   PLOF: Independent with gait, Independent with transfers, Requires assistive device for independence, and Needs  assistance with ADLs  PATIENT GOALS: "get my hands to get a grip"   OBJECTIVE: blood sugar was 176 Note: Objective measures were completed at Evaluation unless otherwise noted.  HAND DOMINANCE: Right  ADLs: Eating: assistance to cut food Grooming: mod I UB Dressing: mod I LB Dressing: mod I Toileting: mod I Bathing: mod I  Equipment: Shower seat with back and Long handled sponge  IADLs: Shopping: mod I Light housekeeping: only cleans bathroom, no assistance Meal Prep: light meal prep mod I Community mobility: dependent Medication management: dependent Financial management: dependent Handwriting:  no change  MOBILITY STATUS: Needs Assist: SPC  ACTIVITY TOLERANCE: Activity tolerance: good to fair  FUNCTIONAL OUTCOME MEASURES: Quick Dash: 38.6 %   UPPER EXTREMITY ROM:     AROM Right (eval) Left (eval)  Shoulder flexion WNL 80  Shoulder abduction WNL 70  Elbow flexion WNL 90  Elbow extension WNL -30  Wrist flexion WNL slight  Wrist extension WNL slight  Wrist pronation WNL lacks active motion  Wrist supination WNL lacks active motion   Digit Composite Flexion WNL lacks active motion  Digit Composite Extension WNL lacks active motion  Digit Opposition WNL lacks active motion  (Blank rows = not tested)  LUE ER just above shoulder; IR just in front of hip  HAND FUNCTION: Lacks active motion  COORDINATION: lacks active motion  SENSATION: WFL  EDEMA: mild edema to L hand  MUSCLE TONE: LUE: Moderate  COGNITION: Overall cognitive status: History of cognitive impairments - at baseline  VISION: Subjective report: no change Baseline vision: Wears glasses all the time and Wears glasses for reading only Visual history:  n/a  PERCEPTION: Not tested  PRAXIS: Not tested  OBSERVATIONS: Pt appears well kept. Significant spasticity to LUE with minimal AROM. SPC for ambulation.                                                                                                                            TREATMENT DATE:    Therapeutic Activities: Table top air hockey presses with affected left hand (right hand used on top of left for AAROM) hitting discs across table and into basket for improved ROM and functional grasp assumption x 5 sets of 6.   Air hockey slides with BUE placed side by side on pillowcase to press pucks across table for improved ROM and bimanual use.  To promote bimanual use, pt completed drawing with use of RUE and LUE to hold notepad in place.  To  promote bimanual use, pt completed 24 piece puzzle with use of RUE to place pieces and LUE to hold puzzle frame.   PATIENT EDUCATION: Education details: functional activities/ROM Person educated: Patient Education method: Explanation, Demonstration, and Verbal cues Education comprehension: verbalized understanding, returned demonstration, verbal cues required, and needs further education  HOME EXERCISE PROGRAM: 09/12/2023: Ways to Manage Spasticity  09/05/23 - Initial UE HEP/stretches - Access Code: [email or web address]  09/12/2023 - spasticity mangement  GOALS:  Continuing GOALS: Target date: 11/08/2023    Pt will independently recall at least 3 ways to manage LUE spasticity. Baseline: Goal status: IN PROGRESS Not consistently using resting hand splint at this time.  2.  Pt will demonstrate volitional use of LUE (I.e. for stabilization) at least 25% of the time. Baseline: 0% - required cues, holds LUE in guarded position Goal status: IN Progress  Pt able to pick up hand without using R hand to pick it up but not using it for activities without therapist's guidance  3. Patient will demonstrate updated LUE HEP with 25% verbal cues or less for proper execution. Baseline:  Goal status: IN Progress   4.  Pt will report no more than moderate difficulty cutting food using adaptive strategies/AD as needed. Baseline: unable Goal status: IN Progress   ASSESSMENT:  CLINICAL IMPRESSION: Pt  learning ways to integrate LUE into functional tasks though requiring cues to keep LUE in place throughout activity.  PERFORMANCE DEFICITS: in functional skills including ADLs, IADLs, coordination, tone, ROM, strength, Fine motor control, Gross motor control, decreased knowledge of use of DME, and UE functional use.   IMPAIRMENTS: are limiting patient from ADLs, IADLs, leisure, and social participation.   CO-MORBIDITIES: may have co-morbidities  that affects occupational performance. Patient will benefit from skilled OT to address above impairments and improve overall function.  REHAB POTENTIAL:  Fair chronicity of CVA   PLAN:  OT FREQUENCY: 1x/week  OT DURATION: additional 4 weeks  PLANNED INTERVENTIONS: 97168 OT Re-evaluation, 97535 self care/ADL training, 82956 therapeutic exercise, 97530 therapeutic activity, 97140 manual therapy, 97035 ultrasound, [id number] electrical stimulation (manual), [id number] Orthotics management and training, 21308 Splinting (initial encounter), [id number] Subsequent splinting/medication, passive range of motion, functional mobility training, patient/family education, and DME and/or AE instructions  RECOMMENDED OTHER SERVICES: N/A for this visit  CONSULTED AND AGREED WITH PLAN OF CARE: Patient  PLAN FOR NEXT SESSION:  A/AROM for table top bimanual task HEP;  Adaptive equipment to cut food Adjust splint as needed - Hazeline Junker, OT 10/16/2023, 4:44 PM

## 2023-10-18 ENCOUNTER — Ambulatory Visit: Payer: 59 | Admitting: Podiatry

## 2023-10-18 ENCOUNTER — Encounter: Payer: Self-pay | Admitting: Podiatry

## 2023-10-18 DIAGNOSIS — B351 Tinea unguium: Secondary | ICD-10-CM | POA: Diagnosis not present

## 2023-10-18 DIAGNOSIS — E1165 Type 2 diabetes mellitus with hyperglycemia: Secondary | ICD-10-CM

## 2023-10-18 DIAGNOSIS — N183 Chronic kidney disease, stage 3 unspecified: Secondary | ICD-10-CM

## 2023-10-18 NOTE — Progress Notes (Signed)
This patient returns to my office for at risk foot care.  This patient requires this care by a professional since this patient will be at risk due to having CKD and diabetes and CVA..  This patient is unable to cut nails himself since the patient cannot reach his nails.These nails are painful walking and wearing shoes.  This patient presents for at risk foot care today.  General Appearance  Alert, conversant and in no acute stress.  Vascular  Dorsalis pedis and posterior tibial  pulses are weakly palpable  bilaterally.  Capillary return is within normal limits  bilaterally. Temperature is within normal limits  bilaterally.  Neurologic  Senn-Weinstein monofilament wire test within normal limits  bilaterally. Muscle power within normal limits bilaterally.  Nails Thick disfigured discolored nails with subungual debris  from hallux to fifth toes bilaterally. No evidence of bacterial infection or drainage bilaterally.  Orthopedic  No limitations of motion  feet .  No crepitus or effusions noted.  No bony pathology or digital deformities noted.  Skin  dry scaly skin with no porokeratosis noted bilaterally.  No signs of infections or ulcers noted.     Onychomycosis  Pain in right toes  Pain in left toes  Consent was obtained for treatment procedures.   Mechanical debridement of nails 1-5  bilaterally performed with a nail nipper.  Filed with dremel without incident.    Return office visit   3 months                   Told patient to return for periodic foot care and evaluation due to potential at risk complications.   Helane Gunther DPM

## 2023-10-21 ENCOUNTER — Other Ambulatory Visit: Payer: Self-pay | Admitting: Family Medicine

## 2023-10-21 DIAGNOSIS — E1122 Type 2 diabetes mellitus with diabetic chronic kidney disease: Secondary | ICD-10-CM

## 2023-10-23 ENCOUNTER — Ambulatory Visit: Payer: Self-pay | Admitting: Occupational Therapy

## 2023-10-23 DIAGNOSIS — R29898 Other symptoms and signs involving the musculoskeletal system: Secondary | ICD-10-CM | POA: Diagnosis not present

## 2023-10-23 DIAGNOSIS — R29818 Other symptoms and signs involving the nervous system: Secondary | ICD-10-CM | POA: Diagnosis not present

## 2023-10-23 DIAGNOSIS — R278 Other lack of coordination: Secondary | ICD-10-CM

## 2023-10-23 DIAGNOSIS — M25632 Stiffness of left wrist, not elsewhere classified: Secondary | ICD-10-CM

## 2023-10-23 DIAGNOSIS — M25642 Stiffness of left hand, not elsewhere classified: Secondary | ICD-10-CM | POA: Diagnosis not present

## 2023-10-23 DIAGNOSIS — M6281 Muscle weakness (generalized): Secondary | ICD-10-CM | POA: Diagnosis not present

## 2023-10-23 NOTE — Therapy (Unsigned)
 OUTPATIENT OCCUPATIONAL THERAPY NEURO TREATMENT  Patient Name: Carsyn Taubman Gorr MRN: [id number] DOB:April 16, 1971, 53 y.o., male Today's Date: 10/24/2023  PCP: Hoy Register, MD REFERRING PROVIDER: Hoy Register, MD   END OF SESSION:  OT End of Session - 10/23/23 1403     Visit Number 8    Number of Visits 10    Date for OT Re-Evaluation 11/08/23    Authorization Type [employer] Medicare - requires auth    Progress Note Due on Visit 10    OT Start Time 1405    OT Stop Time 1444    OT Time Calculation (min) 39 min    Activity Tolerance Patient tolerated treatment well    Behavior During Therapy [facility] for tasks assessed/performed;Restless            Past Medical History:  Diagnosis Date   Anemia    Chronic kidney disease    Diabetes mellitus without complication (HCC)    GERD (gastroesophageal reflux disease)    Hypertension    Sleep apnea    Stroke [facility])    Past Surgical History:  Procedure Laterality Date   AV FISTULA PLACEMENT Right 04/30/2023   Procedure: INSERTION OF RIGHT UPPER ARM GORETEX  GRAFT;  Surgeon: Chuck Hint, MD;  Location: [facility] OR;  Service: Vascular;  Laterality: Right;   BUBBLE STUDY  04/18/2020   Procedure: BUBBLE STUDY;  Surgeon: Quintella Reichert, MD;  Location: [facility] ENDOSCOPY;  Service: Cardiovascular;;   TEE WITHOUT CARDIOVERSION N/A 04/18/2020   Procedure: TRANSESOPHAGEAL ECHOCARDIOGRAM (TEE);  Surgeon: Quintella Reichert, MD;  Location: [facility] ENDOSCOPY;  Service: Cardiovascular;  Laterality: N/A;   Patient Active Problem List   Diagnosis Date Noted   Spastic hemiplegia, unspecified etiology, unspecified laterality (HCC) 01/07/2023   Acquired left foot drop 05/22/2022   Nephrotic syndrome    Peripheral edema    Abnormality of gait 05/23/2020   Type 2 diabetes mellitus with hyperglycemia, without long-term current use of insulin (HCC)    Thrombocytosis    Benign essential HTN    Acute blood loss anemia    Leukocytosis    Slow transit constipation     AKI (acute kidney injury) (HCC)    Diabetes mellitus type 2 with complications, uncontrolled    CVA (cerebral vascular accident) (HCC) 04/22/2020   Acute left-sided weakness    Tobacco abuse    Essential hypertension    Acute CVA (cerebrovascular accident) (HCC) 04/16/2020   Hypertensive urgency 04/16/2020   Type 2 diabetes mellitus with vascular disease (HCC) 04/16/2020   CKD (chronic kidney disease) stage 3, GFR 30-59 ml/min (HCC) 04/23/2019   Hyperlipidemia 04/18/2019   Dyslipidemia associated with type 2 diabetes mellitus (HCC) 08/29/2018   Hypertension associated with type 2 diabetes mellitus (HCC) 08/29/2018    ONSET DATE: 08/06/2023 (Date of referral)  REFERRING DIAG: M62.81 (ICD-10-CM) - Muscle weakness (generalized)   THERAPY DIAG:  Stiffness of left hand, not elsewhere classified  Stiffness of left wrist, not elsewhere classified  Other symptoms and signs involving the musculoskeletal system  Other symptoms and signs involving the nervous system  Other lack of coordination  Rationale for Evaluation and Treatment: Rehabilitation  SUBJECTIVE:   SUBJECTIVE STATEMENT: He usually has someone cut his meat for him.   He did not qualify for CGM as he is on oral medication for DM and not insulin.   Pt accompanied by: self  PERTINENT HISTORY: CKD, DM, HTN, infarct stroke (2021)  Reason for Referral: Left upper extremity weakness   PRECAUTIONS: Fall  and Other: fistula on R arm; latex allergy   WEIGHT BEARING RESTRICTIONS: No  PAIN:  Are you having pain? No  FALLS: Has patient fallen in last 6 months? No  LIVING ENVIRONMENT: Lives with: lives with their family Lives in: House/apartment Stairs: Yes: External: 4 steps; can reach both Has following equipment at home: Single point cane, Walker - 2 wheeled, Wheelchair (manual), and shower chair   PLOF: Independent with gait, Independent with transfers, Requires assistive device for independence, and Needs  assistance with ADLs  PATIENT GOALS: "get my hands to get a grip"   OBJECTIVE: blood sugar was 176 Note: Objective measures were completed at Evaluation unless otherwise noted.  HAND DOMINANCE: Right  ADLs: Eating: assistance to cut food Grooming: mod I UB Dressing: mod I LB Dressing: mod I Toileting: mod I Bathing: mod I  Equipment: Shower seat with back and Long handled sponge  IADLs: Shopping: mod I Light housekeeping: only cleans bathroom, no assistance Meal Prep: light meal prep mod I Community mobility: dependent Medication management: dependent Financial management: dependent Handwriting:  no change  MOBILITY STATUS: Needs Assist: SPC  ACTIVITY TOLERANCE: Activity tolerance: good to fair  FUNCTIONAL OUTCOME MEASURES: Quick Dash: 38.6 %   UPPER EXTREMITY ROM:     AROM Right (eval) Left (eval)  Shoulder flexion WNL 80  Shoulder abduction WNL 70  Elbow flexion WNL 90  Elbow extension WNL -30  Wrist flexion WNL slight  Wrist extension WNL slight  Wrist pronation WNL lacks active motion  Wrist supination WNL lacks active motion   Digit Composite Flexion WNL lacks active motion  Digit Composite Extension WNL lacks active motion  Digit Opposition WNL lacks active motion  (Blank rows = not tested)  LUE ER just above shoulder; IR just in front of hip  HAND FUNCTION: Lacks active motion  COORDINATION: lacks active motion  SENSATION: WFL  EDEMA: mild edema to L hand  MUSCLE TONE: LUE: Moderate  COGNITION: Overall cognitive status: History of cognitive impairments - at baseline  VISION: Subjective report: no change Baseline vision: Wears glasses all the time and Wears glasses for reading only Visual history:  n/a  PERCEPTION: Not tested  PRAXIS: Not tested  OBSERVATIONS: Pt appears well kept. Significant spasticity to LUE with minimal AROM. SPC for ambulation.                                                                                                                            TREATMENT DATE:    OT adjusted resting hand splint for pt to assure improved fit including reducing tendency for L middle DIP hyperextension. OT took picture of placement and then had pt doff and don. Pt able to complete with min cueing for proper completion.  Pt trialed rocker knife with use of RUE and red putty on pronged, suction cup base cutting board. Pt able to cut food with limited difficulty. Pt then able to complete on plate placed on top of  shelf liner. OT educated on the use of shelf liner to reduce sliding.   PATIENT EDUCATION: Education details:Adaptive use; resting hand splint Person educated: Patient Education method: Explanation, Demonstration, and Verbal cues Education comprehension: verbalized understanding, returned demonstration, verbal cues required, and needs further education  HOME EXERCISE PROGRAM: 09/12/2023: Ways to Manage Spasticity  09/05/23 - Initial UE HEP/stretches - Access Code: [email or web address]  09/12/2023 - spasticity mangement  GOALS:  Continuing GOALS: Target date: 11/08/2023    Pt will independently recall at least 3 ways to manage LUE spasticity. Baseline: Goal status: IN PROGRESS Not consistently using resting hand splint at this time.  2.  Pt will demonstrate volitional use of LUE (I.e. for stabilization) at least 25% of the time. Baseline: 0% - required cues, holds LUE in guarded position Goal status: IN Progress  Pt able to pick up hand without using R hand to pick it up but not using it for activities without therapist's guidance  3. Patient will demonstrate updated LUE HEP with 25% verbal cues or less for proper execution. Baseline:  Goal status: IN Progress   4.  Pt will report no more than moderate difficulty cutting food using adaptive strategies/AD as needed. Baseline: unable Goal status: IN Progress   ASSESSMENT:  CLINICAL IMPRESSION: Pt demonstrates good understanding of donning and doffing  resting hand splint for better management of spasticity.  PERFORMANCE DEFICITS: in functional skills including ADLs, IADLs, coordination, tone, ROM, strength, Fine motor control, Gross motor control, decreased knowledge of use of DME, and UE functional use.   IMPAIRMENTS: are limiting patient from ADLs, IADLs, leisure, and social participation.   CO-MORBIDITIES: may have co-morbidities  that affects occupational performance. Patient will benefit from skilled OT to address above impairments and improve overall function.  REHAB POTENTIAL:  Fair chronicity of CVA   PLAN:  OT FREQUENCY: 1x/week  OT DURATION: additional 4 weeks  PLANNED INTERVENTIONS: 97168 OT Re-evaluation, 97535 self care/ADL training, 16109 therapeutic exercise, 97530 therapeutic activity, 97140 manual therapy, 97035 ultrasound, [id number] electrical stimulation (manual), [id number] Orthotics management and training, 60454 Splinting (initial encounter), [id number] Subsequent splinting/medication, passive range of motion, functional mobility training, patient/family education, and DME and/or AE instructions  RECOMMENDED OTHER SERVICES: N/A for this visit  CONSULTED AND AGREED WITH PLAN OF CARE: Patient  PLAN FOR NEXT SESSION: D/C A/AROM for table top bimanual task HEP;  Adaptive equipment to cut food   Delana Meyer, OT 10/24/2023, 11:32 AM

## 2023-10-24 NOTE — Patient Instructions (Signed)
 Marland Kitchen

## 2023-10-25 ENCOUNTER — Ambulatory Visit (HOSPITAL_COMMUNITY)
Admission: RE | Admit: 2023-10-25 | Discharge: 2023-10-25 | Disposition: A | Discharge status: Home / Self Care | Payer: 59 | Source: Ambulatory Visit | Attending: Nephrology | Admitting: Nephrology

## 2023-10-25 VITALS — BP 136/62 | HR 79 | Temp 97.3°F | Resp 18

## 2023-10-25 DIAGNOSIS — N183 Chronic kidney disease, stage 3 unspecified: Secondary | ICD-10-CM | POA: Insufficient documentation

## 2023-10-25 LAB — POCT HEMOGLOBIN-HEMACUE: Hemoglobin: 10.5 g/dL — ABNORMAL LOW (ref 13.0–17.0)

## 2023-10-25 MED ORDER — EPOETIN ALFA-EPBX 10000 UNIT/ML IJ SOLN: 20000.0000 [IU] | INTRAMUSCULAR | Status: DC

## 2023-10-25 MED ORDER — EPOETIN ALFA-EPBX 10000 UNIT/ML IJ SOLN
INTRAMUSCULAR | Status: AC
  Administered 2023-10-25: 20000 [IU] via SUBCUTANEOUS
  Filled 2023-10-25: qty 2

## 2023-10-26 ENCOUNTER — Other Ambulatory Visit: Payer: Self-pay | Admitting: Family Medicine

## 2023-10-26 DIAGNOSIS — E1122 Type 2 diabetes mellitus with diabetic chronic kidney disease: Secondary | ICD-10-CM

## 2023-10-28 NOTE — Telephone Encounter (Signed)
 Requested Prescriptions  Pending Prescriptions Disp Refills   isosorbide mononitrate (IMDUR) 30 MG 24 hr tablet [Pharmacy Med Name: Isosorbide Mononitrate ER 30 MG Oral Tablet Extended Release 24 Hour] 30 tablet 0    Sig: Take 1 tablet by mouth once daily     Cardiovascular:  Nitrates Passed - 10/28/2023  4:34 PM      Passed - Last BP in normal range    BP Readings from Last 1 Encounters:  10/25/23 136/62         Passed - Last Heart Rate in normal range    Pulse Readings from Last 1 Encounters:  10/25/23 79         Passed - Valid encounter within last 12 months    Recent Outpatient Visits           9 months ago Type 2 diabetes mellitus with other specified complication, without long-term current use of insulin (HCC)   [place] [facility] [place] [facility] Dept Of [place]. [facility] Hoy Register, MD   1 year ago Type 2 diabetes mellitus with other specified complication, without long-term current use of insulin (HCC)   [place] [facility] [place] [facility] Dept Of [place]. [facility] Hoy Register, MD   1 year ago Type 2 diabetes mellitus with other specified complication, without long-term current use of insulin (HCC)   [place] [facility] [place] - [facility] Dept Of [place]. [facility] Hoy Register, MD   1 year ago Hypertension in stage 4 chronic kidney disease due to type 2 diabetes mellitus [facility])   [place] [facility] - [facility] Dept Of [place]. [facility] Lois Huxley, [place] L, RPH-CPP   1 year ago Hypertension in stage 4 chronic kidney disease due to type 2 diabetes mellitus [facility])   [place] [facility] - A Dept Of [place]. [facility] Lois Huxley, [place] Senior L, RPH-CPP               carvedilol (COREG) 25 MG tablet [Pharmacy Med Name: Carvedilol 25 MG Oral Tablet] 60 tablet 0    Sig: TAKE 1 TABLET BY MOUTH TWICE DAILY WITH A MEAL     Cardiovascular: Beta Blockers 3 Failed - 10/28/2023  4:34 PM       Failed - Cr in normal range and within 360 days    Creatinine, Ser  Date Value Ref Range Status  09/27/2023 6.59 (H) 0.61 - 1.24 mg/dL Final   Creatinine, Urine  Date Value Ref Range Status  06/11/2020 87.72 mg/dL Final    Comment:    Performed at [facility], [address], [place], [place] [address]         Failed - AST in normal range and within 360 days    AST  Date Value Ref Range Status  02/07/2022 15 0 - 40 IU/L Final         Failed - ALT in normal range and within 360 days    ALT  Date Value Ref Range Status  02/07/2022 23 0 - 44 IU/L Final         Failed - Valid encounter within last 6 months    Recent Outpatient Visits           9 months ago Type 2 diabetes mellitus with other specified complication, without long-term current use of insulin (HCC)   [place] [facility] Of [place]  [facility] Hoy Register, MD   1 year ago Type 2 diabetes mellitus with other specified complication, without long-term current use of insulin (HCC)   [place] [facility] [place] - [facility] Dept Of [place]. [facility] Hoy Register, MD   1 year ago Type 2 diabetes mellitus with other specified complication, without long-term current use of insulin (HCC)   [place] [facility] [place] - A Dept Of [place]. [facility] Hoy Register, MD   1 year ago Hypertension in stage 4 chronic kidney disease due to type 2 diabetes mellitus [facility])    [facility] - [facility] Dept Of [place]. [facility] Lois Huxley, [place] L, RPH-CPP   1 year ago Hypertension in stage 4 chronic kidney disease due to type 2 diabetes mellitus [facility])   [place] [facility] - A Dept Of [place]. [facility] [place], [place] L, RPH-CPP              Passed - Last BP in normal range    BP Readings from Last 1 Encounters:  10/25/23 136/62         Passed - Last Heart Rate in normal range    Pulse Readings from  Last 1 Encounters:  10/25/23 79

## 2023-10-30 ENCOUNTER — Ambulatory Visit: Payer: Self-pay | Admitting: Occupational Therapy

## 2023-11-02 ENCOUNTER — Other Ambulatory Visit: Payer: Self-pay | Admitting: Family Medicine

## 2023-11-02 DIAGNOSIS — E1122 Type 2 diabetes mellitus with diabetic chronic kidney disease: Secondary | ICD-10-CM

## 2023-11-05 ENCOUNTER — Encounter: Payer: Self-pay | Admitting: Family Medicine

## 2023-11-05 ENCOUNTER — Ambulatory Visit: Payer: 59 | Attending: Family Medicine | Admitting: Family Medicine

## 2023-11-05 VITALS — BP 109/62 | HR 80 | Ht 65.0 in | Wt 161.0 lb

## 2023-11-05 DIAGNOSIS — Z0000 Encounter for general adult medical examination without abnormal findings: Secondary | ICD-10-CM

## 2023-11-05 DIAGNOSIS — E1159 Type 2 diabetes mellitus with other circulatory complications: Secondary | ICD-10-CM | POA: Diagnosis not present

## 2023-11-05 LAB — GLUCOSE, POCT (MANUAL RESULT ENTRY): POC Glucose: 140 mg/dL — AB (ref 70–99)

## 2023-11-05 LAB — POCT GLYCOSYLATED HEMOGLOBIN (HGB A1C): HbA1c, POC (controlled diabetic range): 6.6 % (ref 0.0–7.0)

## 2023-11-05 NOTE — Progress Notes (Signed)
 Subjective:   Tony Schmitt is a 53 y.o. male who presents for Medicare Annual/Subsequent preventive examination.  Visit Complete: In person Visit was scheduled for welcome to Medicare however he informs me he has had Medicare for several years. He remains on glipizide for management of his type 2 diabetes mellitus and denies presence of hypoglycemia  Cardiac Risk Factors include: diabetes mellitus;hypertension;male gender     Objective:    Today's Vitals   11/05/23 1453  BP: 109/62  Pulse: 80  SpO2: 96%  Weight: 161 lb (73 kg)  Height: 5\' 5"  (1.651 m)   Body mass index is 26.79 kg/m.     11/05/2023    2:40 PM 08/15/2023    2:06 PM 08/05/2023    1:19 PM 04/30/2023   11:48 AM 06/10/2020    7:27 PM 04/22/2020    3:16 PM 04/15/2020   11:12 PM  Advanced Directives  Does Patient Have a Medical Advance Directive? Yes No No Yes No No No  Type of Aeronautical engineer of [place];Living will     Does patient want to make changes to medical advance directive? No - Patient declined        Copy of Healthcare Power of Attorney in Chart?    No - copy requested     Would patient like information on creating a medical advance directive?  Yes (MAU/Ambulatory/Procedural Areas - Information given) Yes (MAU/Ambulatory/Procedural Areas - Information given)  No - Patient declined No - Patient declined No - Patient declined    Current Medications (verified) Outpatient Encounter Medications as of 11/05/2023  Medication Sig   amLODipine (NORVASC) 10 MG tablet Take 1 tablet by mouth once daily   aspirin 81 MG chewable tablet Chew 1 tablet (81 mg total) by mouth daily.   carvedilol (COREG) 25 MG tablet TAKE 1 TABLET BY MOUTH TWICE DAILY WITH A MEAL   cloNIDine (CATAPRES) 0.1 MG tablet Take 0.1 mg by mouth at bedtime.   Continuous Glucose Receiver (FREESTYLE LIBRE 3 READER) DEVI Place 1 sensor on the skin every 14 days. Use to check glucose continuously. E11.59   Continuous Glucose  Sensor (FREESTYLE LIBRE 3 SENSOR) MISC Place 1 sensor on the skin every 14 days. Use to check glucose continuously. E11.59   epoetin alfa-epbx (RETACRIT) 40981 UNIT/ML injection Inject 20,000 Units into the vein every 14 (fourteen) days.   furosemide (LASIX) 80 MG tablet Take 1 tablet by mouth once daily   glipiZIDE (GLUCOTROL) 5 MG tablet Take 1 tablet (5 mg total) by mouth 2 (two) times daily before a meal. (Patient taking differently: Take 5 mg by mouth daily before breakfast.)   hydrALAZINE (APRESOLINE) 100 MG tablet Take 1 tablet (100 mg total) by mouth 3 (three) times daily.   isosorbide mononitrate (IMDUR) 30 MG 24 hr tablet Take 1 tablet by mouth once daily   omeprazole (PRILOSEC) 40 MG capsule Take 1 capsule (40 mg total) by mouth daily.   oxyCODONE (OXY IR/ROXICODONE) 5 MG immediate release tablet Take 1 tablet (5 mg total) by mouth every 6 (six) hours as needed for severe pain.   polyethylene glycol powder (GLYCOLAX/MIRALAX) 17 GM/SCOOP powder Take 17 g by mouth daily. (Patient taking differently: Take 17 g by mouth daily as needed for mild constipation or moderate constipation.)   rosuvastatin (CRESTOR) 40 MG tablet Take 1 tablet (40 mg total) by mouth daily.   sodium bicarbonate 650 MG tablet Take 1,300 mg by mouth 2 (two) times daily.  TAKE 2 TABLETS 2 X A DAY   [DISCONTINUED] metoprolol tartrate (LOPRESSOR) 100 MG tablet Take 1 tablet (100 mg total) by mouth 2 (two) times daily.   No facility-administered encounter medications on file as of 11/05/2023.    Allergies (verified) Latex   History: Past Medical History:  Diagnosis Date   Anemia    Chronic kidney disease    Diabetes mellitus without complication (HCC)    GERD (gastroesophageal reflux disease)    Hypertension    Sleep apnea    Stroke [facility])    Past Surgical History:  Procedure Laterality Date   AV FISTULA PLACEMENT Right 04/30/2023   Procedure: INSERTION OF RIGHT UPPER ARM GORETEX  GRAFT;  Surgeon: Chuck Hint, MD;  Location: [facility] OR;  Service: Vascular;  Laterality: Right;   BUBBLE STUDY  04/18/2020   Procedure: BUBBLE STUDY;  Surgeon: Quintella Reichert, MD;  Location: [facility] ENDOSCOPY;  Service: Cardiovascular;;   TEE WITHOUT CARDIOVERSION N/A 04/18/2020   Procedure: TRANSESOPHAGEAL ECHOCARDIOGRAM (TEE);  Surgeon: Quintella Reichert, MD;  Location: [facility] ENDOSCOPY;  Service: Cardiovascular;  Laterality: N/A;   Family History  Problem Relation Age of Onset   Diabetes Mother    Kidney disease Mother    Diabetes Father    Hypertension Father    Cancer Father    Heart failure Other    Colon cancer Neg Hx    Colon polyps Neg Hx    Crohn's disease Neg Hx    Esophageal cancer Neg Hx    Rectal cancer Neg Hx    Stomach cancer Neg Hx    Ulcerative colitis Neg Hx    Social History   Socioeconomic History   Marital status: Single    Spouse name: Not on file   Number of children: Not on file   Years of education: Not on file   Highest education level: Some college, no degree  Occupational History   Not on file  Tobacco Use   Smoking status: Former    Types: Cigarettes, Cigars   Smokeless tobacco: Never  Vaping Use   Vaping status: Never Used  Substance and Sexual Activity   Alcohol use: Not Currently   Drug use: Never   Sexual activity: Not Currently    Birth control/protection: Condom  Other Topics Concern   Not on file  Social History Narrative   Not on file   Social Drivers of Health   Financial Resource Strain: Low Risk  (11/05/2023)   Overall Financial Resource Strain (CARDIA)    Difficulty of Paying Living Expenses: Not very hard  Food Insecurity: No Food Insecurity (11/05/2023)   Hunger Vital Sign    Worried About Running Out of Food in the Last Year: Never true    Ran Out of Food in the Last Year: Never true  Transportation Needs: No Transportation Needs (11/03/2023)   PRAPARE - Administrator, Civil Service (Medical): No    Lack of Transportation (Non-Medical):  No  Physical Activity: Insufficiently Active (11/05/2023)   Exercise Vital Sign    Days of Exercise per Week: 2 days    Minutes of Exercise per Session: 20 min  Stress: No Stress Concern Present (11/05/2023)   [employer] of Occupational Health - Occupational Stress Questionnaire    Feeling of Stress : Only a little  Social Connections: Moderately Isolated (11/05/2023)   Social Connection and Isolation Panel [NHANES]    Frequency of Communication with Friends and Family: More than three times a week  Frequency of Social Gatherings with Friends and Family: Once a week    Attends Religious Services: Never    Database administrator or Organizations: Yes    Attends Banker Meetings: Never    Marital Status: Never married    Tobacco Counseling Counseling given: Not Answered   Clinical Intake:     Pain : No/denies pain       How often do you need to have someone help you when you read instructions, pamphlets, or other written materials from your doctor or pharmacy?: 1 - Never  Interpreter Needed?: No      Activities of Daily Living    11/05/2023    2:41 PM 11/03/2023   10:25 PM  In your present state of health, do you have any difficulty performing the following activities:  Hearing? 0 0  Vision? 0 0  Difficulty concentrating or making decisions? 0 0  Walking or climbing stairs? 0 0  Dressing or bathing? 0 0  Doing errands, shopping? 0 0  Preparing Food and eating ? N N  Using the Toilet? N N  In the past six months, have you accidently leaked urine? N N  Do you have problems with loss of bowel control? N N  Managing your Medications? N N  Managing your Finances? N N  Housekeeping or managing your Housekeeping? N N    Patient Care Team: Hoy Register, MD as PCP - General (Family Medicine)  Indicate any recent Medical Services you may have received from other than [facility] providers in the past year (date may be approximate).     Assessment:    This is a routine wellness examination for Rameses.  Hearing/Vision screen Vision Screening   Right eye Left eye Both eyes  Without correction     With correction 20/25 20/25 20/25      Goals Addressed             This Visit's Progress    DIET - EAT MORE FRUITS AND VEGETABLES         Depression Screen    11/05/2023    2:38 PM 08/01/2022    2:05 PM 07/31/2022   12:43 PM 05/01/2022   11:31 AM 01/31/2022    2:22 PM 05/31/2021    2:14 PM 02/28/2021    1:54 PM  PHQ 2/9 Scores  PHQ - 2 Score 3 0 2 0 0 1 2  PHQ- 9 Score 3 3  5 2 4 9     Fall Risk    11/05/2023    2:38 PM 11/03/2023   10:25 PM 05/16/2023    8:47 AM 01/07/2023    2:44 PM 08/01/2022    2:01 PM  Fall Risk   Falls in the past year? 0 0 0 0 0  Number falls in past yr: 0 0 0 0 0  Injury with Fall? 0 0 0 0 0  Risk for fall due to : No Fall Risks   No Fall Risks   Follow up Falls evaluation completed        MEDICARE RISK AT HOME: Medicare Risk at Home Any stairs in or around the home?: No If so, are there any without handrails?: No Home free of loose throw rugs in walkways, pet beds, electrical cords, etc?: No Adequate lighting in your home to reduce risk of falls?: Yes Life alert?: No Use of a cane, walker or w/c?: Yes Grab bars in the bathroom?: Yes Shower chair or bench in shower?: Yes Elevated  toilet seat or a handicapped toilet?: No  TIMED UP AND GO:  Was the test performed?  No    Cognitive Function:    11/05/2023    2:47 PM  MMSE - Mini Mental State Exam  Orientation to time 5  Orientation to Place 4  Registration 3  Attention/ Calculation 4  Recall 1  Language- name 2 objects 2  Language- repeat 1  Language- follow 3 step command 3  Language- read & follow direction 1  Write a sentence 1  Copy design 1  Total score 26        Immunizations Immunization History  Administered Date(s) Administered   Influenza,inj,Quad PF,6+ Mos 05/26/2020, 05/31/2021, 06/06/2022   Moderna Covid-19 Fall  Seasonal Vaccine 16yrs & older 06/13/2022   Moderna Sars-Covid-2 Vaccination 11/23/2019, 12/21/2019, 07/12/2020   Pneumococcal Polysaccharide-23 08/29/2020   Zoster Recombinant(Shingrix) 02/28/2021      Qualifies for Shingles Vaccine? Yes   Zostavax completed Yes   Shingrix Completed?: Yes He received this at his Pharmacy and will obtain records  Screening Tests Health Maintenance  Topic Date Due   OPHTHALMOLOGY EXAM  07/22/2020   Zoster Vaccines- Shingrix (2 of 2) 04/25/2021   Pneumococcal Vaccine 26-52 Years old (2 of 2 - PCV) 08/29/2021   COVID-19 Vaccine (5 - 2024-25 season) 04/07/2023   FOOT EXAM  06/20/2023   INFLUENZA VACCINE  03/06/2024   HEMOGLOBIN A1C  05/06/2024   Medicare Annual Wellness (AWV)  11/04/2024   Colonoscopy  12/31/2032   Hepatitis C Screening  Completed   HIV Screening  Completed   HPV VACCINES  Aged Out   DTaP/Tdap/Td  Discontinued    Health Maintenance  Health Maintenance Due  Topic Date Due   OPHTHALMOLOGY EXAM  07/22/2020   Zoster Vaccines- Shingrix (2 of 2) 04/25/2021   Pneumococcal Vaccine 38-63 Years old (2 of 2 - PCV) 08/29/2021   COVID-19 Vaccine (5 - 2024-25 season) 04/07/2023   FOOT EXAM  06/20/2023   Additional Screening:  Hepatitis C Screening: does qualify; Completed 2021  Vision Screening: Recommended annual ophthalmology exams for early detection of glaucoma and other disorders of the eye. Is the patient up to date with their annual eye exam?  Yes  Who is the provider or what is the name of the office in which the patient attends annual eye exams? Unknown If pt is not established with a provider, would they like to be referred to a provider to establish care? No .   Dental Screening: Recommended annual dental exams for proper oral hygiene  Diabetic Foot Exam: Diabetic Foot Exam: Overdue, Pt has been advised about the importance in completing this exam. Pt is scheduled for diabetic foot exam on at next visit.  Community  Resource Referral / Chronic Care Management: CRR required this visit?  No   CCM required this visit?  No   Lab Results  Component Value Date   HGBA1C 6.6 11/05/2023      Plan:   1. Encounter for Medicare annual wellness exam (Primary) Counseled on 150 minutes of exercise per week, healthy eating (including decreased daily intake of saturated fats, cholesterol, added sugars, sodium),  routine healthcare maintenance.   2. Type 2 diabetes mellitus with vascular disease (HCC) Controlled with A1c of 6.6 -Continue current management - POCT glycosylated hemoglobin (Hb A1C) - POCT glucose (manual entry)   I have personally reviewed and noted the following in the patient's chart:   Medical and social history Use of alcohol, tobacco or illicit drugs  Current medications and supplements including opioid prescriptions. Patient is not currently taking opioid prescriptions. Functional ability and status Nutritional status Physical activity Advanced directives List of other physicians Hospitalizations, surgeries, and ER visits in previous 12 months Vitals Screenings to include cognitive, depression, and falls Referrals and appointments  In addition, I have reviewed and discussed with patient certain preventive protocols, quality metrics, and best practice recommendations. A written personalized care plan for preventive services as well as general preventive health recommendations were provided to patient.     Hoy Register, MD   11/05/2023   After Visit Summary: (In Person-Printed) AVS printed and given to the patient

## 2023-11-05 NOTE — Patient Instructions (Signed)
  Tony Schmitt , Thank you for taking time to come for your Medicare Wellness Visit. I appreciate your ongoing commitment to your health goals. Please review the following plan we discussed and let me know if I can assist you in the future.   These are the goals we discussed:  Goals   None     This is a list of the screening recommended for you and due dates:  Health Maintenance  Topic Date Due   Eye exam for diabetics  07/22/2020   Zoster (Shingles) Vaccine (2 of 2) 04/25/2021   Pneumococcal Vaccination (2 of 2 - PCV) 08/29/2021   COVID-19 Vaccine (5 - 2024-25 season) 04/07/2023   Complete foot exam   06/20/2023   Flu Shot  03/06/2024   Hemoglobin A1C  05/06/2024   Medicare Annual Wellness Visit  11/04/2024   Colon Cancer Screening  12/31/2032   Hepatitis C Screening  Completed   HIV Screening  Completed   HPV Vaccine  Aged Out   DTaP/Tdap/Td vaccine  Discontinued

## 2023-11-07 ENCOUNTER — Ambulatory Visit: Payer: 59 | Attending: Family Medicine | Admitting: Physical Therapy

## 2023-11-07 VITALS — BP 140/68 | HR 81

## 2023-11-07 DIAGNOSIS — R2689 Other abnormalities of gait and mobility: Secondary | ICD-10-CM | POA: Diagnosis not present

## 2023-11-07 DIAGNOSIS — R2681 Unsteadiness on feet: Secondary | ICD-10-CM | POA: Diagnosis not present

## 2023-11-07 DIAGNOSIS — R29818 Other symptoms and signs involving the nervous system: Secondary | ICD-10-CM | POA: Insufficient documentation

## 2023-11-07 DIAGNOSIS — R262 Difficulty in walking, not elsewhere classified: Secondary | ICD-10-CM | POA: Insufficient documentation

## 2023-11-07 DIAGNOSIS — M6281 Muscle weakness (generalized): Secondary | ICD-10-CM | POA: Diagnosis not present

## 2023-11-07 DIAGNOSIS — R29898 Other symptoms and signs involving the musculoskeletal system: Secondary | ICD-10-CM | POA: Diagnosis not present

## 2023-11-07 NOTE — Therapy (Signed)
 OUTPATIENT PHYSICAL THERAPY NEURO TREATMENT - DISCHARGE NOTE   Patient Name: Tony Schmitt MRN: [id number] DOB:November 17, 1970, 53 y.o., male Today's Date: 11/07/2023   PCP: Hoy Register, MD REFERRING PROVIDER: Hoy Register, MD  PHYSICAL THERAPY DISCHARGE SUMMARY  Visits from Start of Care: 8  Current functional level related to goals / functional outcomes: Mod I   Remaining deficits: Decreased L hemibody strength   Education / Equipment: Handout for final HEP, hurricane   Patient agrees to discharge. Patient goals were partially met. Patient is being discharged due to being pleased with the current functional level.    END OF SESSION:    PT End of Session - 11/07/23 1318     Visit Number 8    Number of Visits 9    Date for PT Re-Evaluation 11/28/23   to allow for scheduling delays   Authorization Type [employer] Dual    PT Start Time 1315    PT Stop Time 1345   d/c   PT Time Calculation (min) 30 min    Equipment Utilized During Treatment Gait belt    Activity Tolerance Patient tolerated treatment well    Behavior During Therapy WFL for tasks assessed/performed               Past Medical History:  Diagnosis Date   Anemia    Chronic kidney disease    Diabetes mellitus without complication (HCC)    GERD (gastroesophageal reflux disease)    Hypertension    Sleep apnea    Stroke [facility])    Past Surgical History:  Procedure Laterality Date   AV FISTULA PLACEMENT Right 04/30/2023   Procedure: INSERTION OF RIGHT UPPER ARM GORETEX  GRAFT;  Surgeon: Chuck Hint, MD;  Location: [facility] OR;  Service: Vascular;  Laterality: Right;   BUBBLE STUDY  04/18/2020   Procedure: BUBBLE STUDY;  Surgeon: Quintella Reichert, MD;  Location: [facility] ENDOSCOPY;  Service: Cardiovascular;;   TEE WITHOUT CARDIOVERSION N/A 04/18/2020   Procedure: TRANSESOPHAGEAL ECHOCARDIOGRAM (TEE);  Surgeon: Quintella Reichert, MD;  Location: [facility] ENDOSCOPY;  Service: Cardiovascular;  Laterality: N/A;   Patient  Active Problem List   Diagnosis Date Noted   Spastic hemiplegia, unspecified etiology, unspecified laterality (HCC) 01/07/2023   Acquired left foot drop 05/22/2022   Nephrotic syndrome    Peripheral edema    Abnormality of gait 05/23/2020   Type 2 diabetes mellitus with hyperglycemia, without long-term current use of insulin (HCC)    Thrombocytosis    Benign essential HTN    Acute blood loss anemia    Leukocytosis    Slow transit constipation    AKI (acute kidney injury) (HCC)    Diabetes mellitus type 2 with complications, uncontrolled    CVA (cerebral vascular accident) (HCC) 04/22/2020   Acute left-sided weakness    Tobacco abuse    Essential hypertension    Acute CVA (cerebrovascular accident) (HCC) 04/16/2020   Hypertensive urgency 04/16/2020   Type 2 diabetes mellitus with vascular disease (HCC) 04/16/2020   CKD (chronic kidney disease) stage 3, GFR 30-59 ml/min (HCC) 04/23/2019   Hyperlipidemia 04/18/2019   Dyslipidemia associated with type 2 diabetes mellitus (HCC) 08/29/2018   Hypertension associated with type 2 diabetes mellitus (HCC) 08/29/2018    ONSET DATE: 07/18/2023  REFERRING DIAG: G81.10 (ICD-10-CM) - Spastic hemiplegia, unspecified etiology, unspecified laterality (HCC)  THERAPY DIAG:  Other symptoms and signs involving the nervous system  Other symptoms and signs involving the musculoskeletal system  Muscle weakness (generalized)  Unsteadiness on  feet  Other abnormalities of gait and mobility  Difficulty in walking, not elsewhere classified  Rationale for Evaluation and Treatment: Rehabilitation  SUBJECTIVE:                                                                                                                                                                                             SUBJECTIVE STATEMENT: Pt did get his AFO adjusted at [facility], fits his LLE better now. He does have to wear pants with it so its not directly on his skin. Pt's  mom does help him take brace on and off, not able to do independently. Denies falls, no pain. Pt reports his BG has been "average", not running too high.  Pt accompanied by: family member mom (drops him off)  PERTINENT HISTORY: CKD, DM, HTN, stroke (2021)  PAIN:  Are you having pain? No  PRECAUTIONS: Fall and Other: fistula on R arm  RED FLAGS: None   WEIGHT BEARING RESTRICTIONS: No  FALLS: Has patient fallen in last 6 months? No  LIVING ENVIRONMENT: Lives with: lives with their family Lives in: House/apartment Stairs: Yes: External: 4 steps; can reach both Has following equipment at home: Single point cane, Walker - 2 wheeled, Wheelchair (manual), and shower chair  PLOF: Independent with gait, Independent with transfers, Requires assistive device for independence, and Needs assistance with ADLs  PATIENT GOALS: "get my hands to get a grip" "work towards being able to get a kidney"  OBJECTIVE:  Note: Objective measures were completed at Evaluation unless otherwise noted.  DIAGNOSTIC FINDINGS: None relevant to this POC  COGNITION: Overall cognitive status: Within functional limits for tasks assessed   SENSATION: Gets N/T in L hemibody every 3 months or so  EDEMA:  Swelling in L hand  POSTURE: rounded shoulders, forward head, and posterior pelvic tilt  LOWER EXTREMITY ROM:     Active  Right Eval Left Eval  Hip flexion    Hip extension    Hip abduction    Hip adduction    Hip internal rotation    Hip external rotation    Knee flexion    Knee extension    Ankle dorsiflexion    Ankle plantarflexion    Ankle inversion    Ankle eversion     (Blank rows = not tested)  LOWER EXTREMITY MMT:    MMT Right Eval Left Eval  Hip flexion 4 3  Hip extension    Hip abduction    Hip adduction    Hip internal rotation    Hip external rotation    Knee flexion 5 2-  Knee extension 5 2-  Ankle dorsiflexion 5 3  Ankle plantarflexion    Ankle inversion    Ankle  eversion    (Blank rows = not tested)  BED MOBILITY:  Mod I per pt report, utilizes bedrail and has an adjustable bed  TRANSFERS: Assistive device utilized: None  Sit to stand: Modified independence Stand to sit: Modified independence Chair to chair: Modified independence  GAIT: Gait pattern: decreased arm swing- Left, decreased hip/knee flexion- Left, decreased ankle dorsiflexion- Left, circumduction- Left, lateral lean- Right, and abducted- Left Distance walked: various clinic distances Assistive device utilized: Single point cane Level of assistance: Modified independence Comments: L hemiparesis                                                                             TREATMENT:  Vitals:   11/07/23 1323  BP: (!) 140/68  Pulse: 81   Assessed BP prior to intervention in sitting on LUE at rest.  Gait Gait pattern: decreased hip/knee flexion- Left and decreased ankle dorsiflexion- Left Distance walked: 115 ft Assistive device utilized:  intermittent use of hurrycane Level of assistance: Modified independence Comments: improved LLE control with use of adjusted AFO, ongoing decreased hip and knee flexion   TherAct Review of HEP: Staggered sit to stand from chair with arms Staggered sit to stand from 21.5" mat table At countertop with BUE support: Lateral weight shift L/R Forwards/backwards weight shift through each LE    PATIENT EDUCATION: Education details: reviewed HEP, PT POC with plan to d/c today and pt agreeable Person educated: Patient Education method: Explanation and Demonstration Education comprehension: verbalized understanding and returned demonstration  HOME EXERCISE PROGRAM: Access Code: [email or web address] URL: [email or web address] Date: 08/15/2023 Prepared by: Camille Bal  Exercises - Staggered Sit-to-Stand  - 1 x daily - 7 x weekly - 3 sets - 8-10 reps - Side to Side Weight Shift with Counter Support  - 1 x daily - 7 x weekly - 3  sets - 10 reps - Staggered Stance Forward Backward Weight Shift with Counter Support  - 1 x daily - 7 x weekly - 3 sets - 10 reps   GOALS: Goals reviewed with patient? Yes  SHORT TERM GOALS: Target date: 09/02/2023  Pt will be independent with initial HEP for improved strength, balance, transfers and gait. Baseline: reviewed 1/30 Goal status: IN PROGRESS  2.  Pt will improve gait velocity to at least 1.75 ft/sec for improved gait efficiency and performance at mod I level  Baseline: 1.32 ft/sec mod I SPC (12/30), 1.45 ft/sec mod I no AD (1/30) Goal status: IN PROGRESS  3.  Pt will improve normal TUG to less than or equal to 19 seconds for improved functional mobility and decreased fall risk. Baseline: 23.81 sec (12/30), 23.15 sec (1/30) Goal status: IN PROGRESS  4.  Pt will improve Berg score to 39/56 for decreased fall risk Baseline: 34/56 (12/30), 45/56 (1/30) Goal status: MET  LONG TERM GOALS: Target date: 10/03/2023  Pt will be independent with final HEP for improved strength, balance, transfers and gait. Baseline:  Goal status: IN PROGRESS  2.  Pt will improve gait velocity to at least 1.75 ft/sec for improved gait efficiency and performance at mod I level  Baseline:  1.32 ft/sec mod I SPC (12/30), 1.45 ft/sec mod I no AD (1/30), 1.56 ft/sec mod I SPC (2/27) Goal status: NOT MET  3.  Pt will improve normal TUG to less than or equal to 19 seconds for improved functional mobility and decreased fall risk. Baseline: 23.81 sec (12/30), 23.15 sec (1/30), 19 sec with SPC (2/27)  Goal status: MET  4.  Pt will improve Berg score to 44/56 for decreased fall risk Baseline: 34/56 (12/30), 45/56 (1/30), 47/56 (2/27) Goal status: MET  NEW SHORT TERM GOALS: Target date: 10/31/2023  Pt will reach out to [facility] for L AFO adjustments and return to PT for gait assessments with newly adjusted AFO. Baseline:  Goal status: MET   NEW LONG TERM GOALS: Target date: 11/28/2023   Pt  will demonstrate good awareness of how to don/doff his L AFO at mod I level. Baseline:  Goal status: GOAL D/C - pt needs assist from family to don his AFO and agreeable with this  Pt will be independent with final HEP for improved strength, balance, transfers and gait. Baseline:  Goal status: MET    ASSESSMENT:  CLINICAL IMPRESSION: Emphasis of skilled PT session on assessing STG and LTG with patient returning to PT after pause x 1 month to allow time to have his AFO adjusted. Pt was able to see [facility] about 3 weeks ago and have his AFO adjusted so that it fits him better. He does need assistance to don his AFO at times but is ok with this, not wanting to work on donning/doffing in therapy. Pt has met 1/1 STG and 1/2 LTG. His other remaining LTG was d/c as he did not want to work on donning and doffing his brace. Pt agreeable to d/c from OPPT services at this time and continue with his HEP independently at home.  OBJECTIVE IMPAIRMENTS: Abnormal gait, decreased balance, decreased coordination, decreased mobility, difficulty walking, decreased ROM, decreased strength, increased muscle spasms, impaired sensation, impaired tone, impaired UE functional use, improper body mechanics, and postural dysfunction.   ACTIVITY LIMITATIONS: carrying, lifting, bending, stairs, transfers, dressing, and reach over head  PARTICIPATION LIMITATIONS: driving and community activity  PERSONAL FACTORS: Time since onset of injury/illness/exacerbation, Transportation, and 3+ comorbidities:    CKD, DM, HTN, strokeare also affecting patient's functional outcome.   REHAB POTENTIAL: Fair chronicity of CVA  CLINICAL DECISION MAKING: Stable/uncomplicated  EVALUATION COMPLEXITY: Low  PLAN: Discharge from PT   Peter Congo, PT Peter Congo, PT, DPT, CSRS    11/07/2023, 1:46 PM

## 2023-11-10 ENCOUNTER — Other Ambulatory Visit: Payer: Self-pay | Admitting: Family Medicine

## 2023-11-10 DIAGNOSIS — E1122 Type 2 diabetes mellitus with diabetic chronic kidney disease: Secondary | ICD-10-CM

## 2023-11-12 NOTE — Telephone Encounter (Signed)
 Requested Prescriptions  Pending Prescriptions Disp Refills   hydrALAZINE (APRESOLINE) 100 MG tablet [Pharmacy Med Name: hydrALAZINE HCl 100 MG Oral Tablet] 270 tablet 1    Sig: TAKE 1 TABLET BY MOUTH THREE TIMES DAILY     Cardiovascular:  Vasodilators Failed - 11/12/2023  9:55 AM      Failed - HCT in normal range and within 360 days    HCT  Date Value Ref Range Status  09/27/2023 32.6 (L) 39.0 - 52.0 % Final   Hematocrit  Date Value Ref Range Status  06/07/2020 30.3 (L) 37.5 - 51.0 % Final         Failed - HGB in normal range and within 360 days    Hemoglobin  Date Value Ref Range Status  10/25/2023 10.5 (L) 13.0 - 17.0 g/dL Final  81/19/1478 9.8 (L) 13.0 - 17.7 g/dL Final         Failed - RBC in normal range and within 360 days    RBC  Date Value Ref Range Status  09/27/2023 3.76 (L) 4.22 - 5.81 MIL/uL Final         Failed - ANA Screen, Ifa, Serum in normal range and within 360 days    Anti Nuclear Antibody (ANA)  Date Value Ref Range Status  04/17/2020 Negative Negative Final    Comment:    (NOTE) Performed At: [facility] [address] [place], [place] [id number] Jolene Schimke MD MV:[id number]          Failed - Last BP in normal range    BP Readings from Last 1 Encounters:  11/07/23 (!) 140/68         Passed - WBC in normal range and within 360 days    WBC  Date Value Ref Range Status  09/27/2023 9.5 4.0 - 10.5 K/uL Final         Passed - PLT in normal range and within 360 days    Platelets  Date Value Ref Range Status  09/27/2023 269 150 - 400 K/uL Final  06/07/2020 471 (H) 150 - 450 x10E3/uL Final   Platelet Count, POC  Date Value Ref Range Status  04/16/2019 424 142 - 424 K/uL Final         Passed - Valid encounter within last 12 months    Recent Outpatient Visits           1 week ago Encounter for [employer] annual wellness exam   [facility] [place]. [facility] Hoy Register, MD   10  months ago Type 2 diabetes mellitus with other specified complication, without long-term current use of insulin (HCC)   [facility] [place] - [facility] Dept Of [place]. [facility] Hoy Register, MD   1 year ago Type 2 diabetes mellitus with other specified complication, without long-term current use of insulin (HCC)   [facility] - [facility] Dept Of [place]. [facility] Hoy Register, MD   1 year ago Type 2 diabetes mellitus with other specified complication, without long-term current use of insulin (HCC)   [facility] Dept Of [place]. [facility] Hoy Register, MD   1 year ago Hypertension in stage 4 chronic kidney disease due to type 2 diabetes mellitus [facility])   [facility] - [facility] Dept Of [place]. [facility] Drucilla Chalet, RPH-CPP  amLODipine (NORVASC) 10 MG tablet [Pharmacy Med Name: amLODIPine Besylate 10 MG Oral Tablet] 90 tablet 1    Sig: Take 1 tablet by mouth once daily     Cardiovascular: Calcium Channel Blockers 2 Failed - 11/12/2023  9:55 AM      Failed - Last BP in normal range    BP Readings from Last 1 Encounters:  11/07/23 (!) 140/68         Failed - Valid encounter within last 6 months    Recent Outpatient Visits           1 week ago Encounter for [employer] annual wellness exam   [facility] - [facility] Dept Of [place]. [facility] Hoy Register, MD   10 months ago Type 2 diabetes mellitus with other specified complication, without long-term current use of insulin (HCC)   [facility] - [facility] Dept Of [place]. [facility] Hoy Register, MD   1 year ago Type 2 diabetes mellitus with other specified complication, without long-term current use of insulin (HCC)   [facility] [place] - A Dept Of [place]. [facility] Hoy Register, MD   1 year ago Type 2 diabetes mellitus  with other specified complication, without long-term current use of insulin (HCC)   [facility] - A Dept Of [place]. [facility] Hoy Register, MD   1 year ago Hypertension in stage 4 chronic kidney disease due to type 2 diabetes mellitus [facility])   [facility] - A Dept Of [place]. [facility] [place], [place] L, RPH-CPP              Passed - Last Heart Rate in normal range    Pulse Readings from Last 1 Encounters:  11/07/23 81

## 2023-11-14 ENCOUNTER — Ambulatory Visit: Payer: 59 | Admitting: Physical Therapy

## 2023-11-20 DIAGNOSIS — E872 Acidosis, unspecified: Secondary | ICD-10-CM | POA: Diagnosis not present

## 2023-11-20 DIAGNOSIS — D631 Anemia in chronic kidney disease: Secondary | ICD-10-CM | POA: Diagnosis not present

## 2023-11-20 DIAGNOSIS — N2581 Secondary hyperparathyroidism of renal origin: Secondary | ICD-10-CM | POA: Diagnosis not present

## 2023-11-20 DIAGNOSIS — I120 Hypertensive chronic kidney disease with stage 5 chronic kidney disease or end stage renal disease: Secondary | ICD-10-CM | POA: Diagnosis not present

## 2023-11-20 DIAGNOSIS — N185 Chronic kidney disease, stage 5: Secondary | ICD-10-CM | POA: Diagnosis not present

## 2023-11-22 ENCOUNTER — Ambulatory Visit (HOSPITAL_COMMUNITY)
Admission: RE | Admit: 2023-11-22 | Discharge: 2023-11-22 | Disposition: A | Discharge status: Home / Self Care | Source: Ambulatory Visit | Attending: Nephrology | Admitting: Nephrology

## 2023-11-22 VITALS — BP 137/71 | HR 77 | Temp 97.7°F | Resp 17

## 2023-11-22 DIAGNOSIS — N183 Chronic kidney disease, stage 3 unspecified: Secondary | ICD-10-CM | POA: Diagnosis not present

## 2023-11-22 LAB — CBC WITH DIFFERENTIAL/PLATELET
Abs Immature Granulocytes: 0.05 10*3/uL (ref 0.00–0.07)
Basophils Absolute: 0.1 10*3/uL (ref 0.0–0.1)
Basophils Relative: 1 %
Eosinophils Absolute: 0.2 10*3/uL (ref 0.0–0.5)
Eosinophils Relative: 2 %
HCT: 36.5 % — ABNORMAL LOW (ref 39.0–52.0)
Hemoglobin: 11.3 g/dL — ABNORMAL LOW (ref 13.0–17.0)
Immature Granulocytes: 1 %
Lymphocytes Relative: 19 %
Lymphs Abs: 2.1 10*3/uL (ref 0.7–4.0)
MCH: 27 pg (ref 26.0–34.0)
MCHC: 31 g/dL (ref 30.0–36.0)
MCV: 87.3 fL (ref 80.0–100.0)
Monocytes Absolute: 0.6 10*3/uL (ref 0.1–1.0)
Monocytes Relative: 5 %
Neutro Abs: 8.1 10*3/uL — ABNORMAL HIGH (ref 1.7–7.7)
Neutrophils Relative %: 72 %
Platelets: 267 10*3/uL (ref 150–400)
RBC: 4.18 MIL/uL — ABNORMAL LOW (ref 4.22–5.81)
RDW: 14.7 % (ref 11.5–15.5)
WBC: 11.1 10*3/uL — ABNORMAL HIGH (ref 4.0–10.5)
nRBC: 0 % (ref 0.0–0.2)

## 2023-11-22 LAB — IRON AND TIBC
Iron: 72 ug/dL (ref 45–182)
Saturation Ratios: 30 % (ref 17.9–39.5)
TIBC: 241 ug/dL — ABNORMAL LOW (ref 250–450)
UIBC: 169 ug/dL

## 2023-11-22 LAB — FERRITIN: Ferritin: 539 ng/mL — ABNORMAL HIGH (ref 24–336)

## 2023-11-22 LAB — RENAL FUNCTION PANEL
Albumin: 3.9 g/dL (ref 3.5–5.0)
Anion gap: 11 (ref 5–15)
BUN: 59 mg/dL — ABNORMAL HIGH (ref 6–20)
CO2: 18 mmol/L — ABNORMAL LOW (ref 22–32)
Calcium: 10.3 mg/dL (ref 8.9–10.3)
Chloride: 109 mmol/L (ref 98–111)
Creatinine, Ser: 6.46 mg/dL — ABNORMAL HIGH (ref 0.61–1.24)
GFR, Estimated: 10 mL/min — ABNORMAL LOW (ref >60)
Glucose, Bld: 138 mg/dL — ABNORMAL HIGH (ref 70–99)
Phosphorus: 4.6 mg/dL (ref 2.5–4.6)
Potassium: 4.8 mmol/L (ref 3.5–5.1)
Sodium: 138 mmol/L (ref 135–145)

## 2023-11-22 LAB — POCT HEMOGLOBIN-HEMACUE: Hemoglobin: 10.8 g/dL — ABNORMAL LOW (ref 13.0–17.0)

## 2023-11-22 MED ORDER — EPOETIN ALFA 20000 UNIT/ML IJ SOLN
20000.0000 [IU] | Freq: Once | INTRAMUSCULAR | Status: AC
  Administered 2023-11-22: 20000 [IU] via SUBCUTANEOUS

## 2023-11-22 MED ORDER — EPOETIN ALFA 20000 UNIT/ML IJ SOLN
INTRAMUSCULAR | Status: DC
Start: 2023-11-22 — End: 2023-11-23
  Filled 2023-11-22: qty 1

## 2023-11-23 ENCOUNTER — Encounter (HOSPITAL_COMMUNITY): Payer: Self-pay

## 2023-11-23 ENCOUNTER — Emergency Department (HOSPITAL_COMMUNITY)
Admission: EM | Admit: 2023-11-23 | Discharge: 2023-11-23 | Disposition: A | Discharge status: Home / Self Care | Attending: Emergency Medicine | Admitting: Emergency Medicine

## 2023-11-23 ENCOUNTER — Other Ambulatory Visit: Payer: Self-pay

## 2023-11-23 DIAGNOSIS — Z91040 Latex allergy status: Secondary | ICD-10-CM | POA: Insufficient documentation

## 2023-11-23 DIAGNOSIS — M24542 Contracture, left hand: Secondary | ICD-10-CM | POA: Diagnosis not present

## 2023-11-23 DIAGNOSIS — Z7982 Long term (current) use of aspirin: Secondary | ICD-10-CM | POA: Diagnosis not present

## 2023-11-23 DIAGNOSIS — M79642 Pain in left hand: Secondary | ICD-10-CM | POA: Diagnosis present

## 2023-11-23 LAB — CBC WITH DIFFERENTIAL/PLATELET
Abs Immature Granulocytes: 0.04 10*3/uL (ref 0.00–0.07)
Basophils Absolute: 0.1 10*3/uL (ref 0.0–0.1)
Basophils Relative: 1 %
Eosinophils Absolute: 0.2 10*3/uL (ref 0.0–0.5)
Eosinophils Relative: 2 %
HCT: 35.7 % — ABNORMAL LOW (ref 39.0–52.0)
Hemoglobin: 11 g/dL — ABNORMAL LOW (ref 13.0–17.0)
Immature Granulocytes: 0 %
Lymphocytes Relative: 24 %
Lymphs Abs: 2.7 10*3/uL (ref 0.7–4.0)
MCH: 26.9 pg (ref 26.0–34.0)
MCHC: 30.8 g/dL (ref 30.0–36.0)
MCV: 87.3 fL (ref 80.0–100.0)
Monocytes Absolute: 0.7 10*3/uL (ref 0.1–1.0)
Monocytes Relative: 6 %
Neutro Abs: 7.8 10*3/uL — ABNORMAL HIGH (ref 1.7–7.7)
Neutrophils Relative %: 67 %
Platelets: 262 10*3/uL (ref 150–400)
RBC: 4.09 MIL/uL — ABNORMAL LOW (ref 4.22–5.81)
RDW: 14.7 % (ref 11.5–15.5)
WBC: 11.5 10*3/uL — ABNORMAL HIGH (ref 4.0–10.5)
nRBC: 0 % (ref 0.0–0.2)

## 2023-11-23 LAB — BASIC METABOLIC PANEL WITH GFR
Anion gap: 10 (ref 5–15)
BUN: 67 mg/dL — ABNORMAL HIGH (ref 6–20)
CO2: 18 mmol/L — ABNORMAL LOW (ref 22–32)
Calcium: 10.1 mg/dL (ref 8.9–10.3)
Chloride: 111 mmol/L (ref 98–111)
Creatinine, Ser: 7.07 mg/dL — ABNORMAL HIGH (ref 0.61–1.24)
GFR, Estimated: 9 mL/min — ABNORMAL LOW (ref >60)
Glucose, Bld: 193 mg/dL — ABNORMAL HIGH (ref 70–99)
Potassium: 4.8 mmol/L (ref 3.5–5.1)
Sodium: 139 mmol/L (ref 135–145)

## 2023-11-23 MED ORDER — DIAZEPAM 5 MG PO TABS
5.0000 mg | ORAL_TABLET | Freq: Once | ORAL | Status: AC
  Administered 2023-11-23: 5 mg via ORAL
  Filled 2023-11-23: qty 1

## 2023-11-23 NOTE — Discharge Instructions (Signed)
 It was a pleasure taking part in your care.  As discussed, you came in complaining of contractures to your left hand.  We provided you with Valium , 5 mg, and your contracture resolved.  Please follow-up with your PCP for reevaluation.  Please return to the ED with any new or worsening symptoms.  Please follow-up with nephrology as needed.

## 2023-11-23 NOTE — ED Triage Notes (Signed)
 Pt arrived from home via POV c/o left hand cramping up that started this evening. 3/10 on pain scale described as a cramp. Pt denies any other symptoms.

## 2023-11-23 NOTE — ED Provider Notes (Signed)
 [place] EMERGENCY DEPARTMENT AT [facility] Provider Note   CSN: [id number] Arrival date & time: 11/23/23  0442     History  Chief Complaint  Patient presents with   Hand Pain    Tony Schmitt is a 53 y.o. male.  53 year old male with history of CVA 3 years ago with left side deficits presents with concern for left hand cramping. Patient states this happens about every 3 weeks and usually lasts 2 hours. Patient states tonight's episode has been ongoing for about 3 hours which concerned him and prompted visit to the ER. No other symptoms or concerns. Patient goes to PT for this, was told to stretch his fingers out, he has tried this without improvement.        Home Medications Prior to Admission medications   Medication Sig Start Date End Date Taking? Authorizing Provider  amLODipine  (NORVASC ) 10 MG tablet Take 1 tablet by mouth once daily 11/12/23   Newlin, Enobong, MD  aspirin  81 MG chewable tablet Chew 1 tablet (81 mg total) by mouth daily. 04/20/20   Regalado, Belkys A, MD  carvedilol  (COREG ) 25 MG tablet TAKE 1 TABLET BY MOUTH TWICE DAILY WITH A MEAL 10/28/23   Newlin, Enobong, MD  cloNIDine (CATAPRES) 0.1 MG tablet Take 0.1 mg by mouth at bedtime. 10/29/21   [provider]  Continuous Glucose Receiver (FREESTYLE LIBRE 3 READER) DEVI Place 1 sensor on the skin every 14 days. Use to check glucose continuously. E11.59 10/04/23   Joaquin Mulberry, MD  Continuous Glucose Sensor (FREESTYLE LIBRE 3 SENSOR) MISC Place 1 sensor on the skin every 14 days. Use to check glucose continuously. E11.59 10/04/23   Newlin, Enobong, MD  epoetin  alfa-epbx (RETACRIT ) 20000 UNIT/ML injection Inject 20,000 Units into the vein every 14 (fourteen) days.    [provider]  furosemide  (LASIX ) 80 MG tablet Take 1 tablet by mouth once daily 07/10/23   Newlin, Enobong, MD  glipiZIDE  (GLUCOTROL ) 5 MG tablet Take 1 tablet (5 mg total) by mouth 2 (two) times daily before a  meal. Patient taking differently: Take 5 mg by mouth daily before breakfast. 01/07/23   Joaquin Mulberry, MD  hydrALAZINE  (APRESOLINE ) 100 MG tablet TAKE 1 TABLET BY MOUTH THREE TIMES DAILY 11/12/23   Newlin, Enobong, MD  isosorbide  mononitrate (IMDUR ) 30 MG 24 hr tablet Take 1 tablet by mouth once daily 10/28/23   Newlin, Enobong, MD  omeprazole  (PRILOSEC) 40 MG capsule Take 1 capsule (40 mg total) by mouth daily. 09/02/23   Newlin, Enobong, MD  oxyCODONE  (OXY IR/ROXICODONE ) 5 MG immediate release tablet Take 1 tablet (5 mg total) by mouth every 6 (six) hours as needed for severe pain. 04/30/23   Cordie Deters, PA-C  polyethylene glycol powder (GLYCOLAX /MIRALAX ) 17 GM/SCOOP powder Take 17 g by mouth daily. Patient taking differently: Take 17 g by mouth daily as needed for mild constipation or moderate constipation. 03/20/23   Newlin, Enobong, MD  rosuvastatin  (CRESTOR ) 40 MG tablet Take 1 tablet (40 mg total) by mouth daily. 05/10/23   Newlin, Enobong, MD  sodium bicarbonate  650 MG tablet Take 1,300 mg by mouth 2 (two) times daily. TAKE 2 TABLETS 2 X A DAY 06/11/22   [provider]  metoprolol  tartrate (LOPRESSOR ) 100 MG tablet Take 1 tablet (100 mg total) by mouth 2 (two) times daily. 05/11/20 05/26/20  Angiulli, Everlyn Hockey, PA-C      Allergies    Latex    Review of Systems  Review of Systems Negative except as per HPI Physical Exam Updated Vital Signs BP (!) 164/72 (BP Location: Left Arm)   Pulse 88   Temp 98.6 F (37 C)   Resp 17   Ht 5\' 4"  (1.626 m)   Wt 72.6 kg   SpO2 100%   BMI 27.46 kg/m  Physical Exam Vitals and nursing note reviewed.  Constitutional:      General: He is not in acute distress.    Appearance: He is well-developed. He is not diaphoretic.  HENT:     Head: Normocephalic and atraumatic.  Cardiovascular:     Pulses: Normal pulses.  Pulmonary:     Effort: Pulmonary effort is normal.  Musculoskeletal:        General: Deformity present. No tenderness.      Comments: Left hand fingers held in flexion, able to extend with resistance. Sensation intact, cap refill normal.   Skin:    General: Skin is warm and dry.     Findings: No erythema or rash.  Neurological:     Mental Status: He is alert and oriented to person, place, and time.  Psychiatric:        Behavior: Behavior normal.     ED Results / Procedures / Treatments   Labs (all labs ordered are listed, but only abnormal results are displayed) Labs Reviewed  CBC WITH DIFFERENTIAL/PLATELET  BASIC METABOLIC PANEL WITH GFR    EKG None  Radiology No results found.  Procedures Procedures    Medications Ordered in ED Medications  diazepam  (VALIUM ) tablet 5 mg (5 mg Oral Given 11/23/23 0556)    ED Course/ Medical Decision Making/ A&P                                 Medical Decision Making Amount and/or Complexity of Data Reviewed Labs: ordered.  Risk Prescription drug management.   53 yo male with concern for left hand contracture secondary to CVA 3 years ago, reports tightening of the hand lasting longer than typical for him tonight. Exam with flexed fingers of left hand, cap refill, sensation intact. Discussed with Dr. Luberta Ruse, ER attending, recommends check electrolytes and provide with muscle relaxor. Patient given valium . Care signed out to oncoming provider at change of shift pending labs and recheck.         Final Clinical Impression(s) / ED Diagnoses Final diagnoses:  Contracture of left hand    Rx / DC Orders ED Discharge Orders     None         Darlis Eisenmenger, PA-C 11/23/23 1610    Eve Hinders, MD 11/23/23 [phone]

## 2023-11-23 NOTE — ED Provider Notes (Signed)
  Physical Exam  BP (!) 164/72 (BP Location: Left Arm)   Pulse 88   Temp 98.6 F (37 C)   Resp 17   Ht 5\' 4"  (1.626 m)   Wt 72.6 kg   SpO2 100%   BMI 27.46 kg/m   Physical Exam Vitals and nursing note reviewed.  Constitutional:      General: He is not in acute distress.    Appearance: He is not toxic-appearing.  HENT:     Head: Normocephalic and atraumatic.  Pulmonary:     Effort: No respiratory distress.  Skin:    Coloration: Skin is not jaundiced or pale.  Neurological:     Mental Status: He is alert and oriented to person, place, and time.  Psychiatric:        Behavior: Behavior normal.     Procedures  Procedures  ED Course / MDM   Clinical Course as of 11/23/23 0732  Sat Nov 23, 2023  0638 CVA three years ago, L sided deficits. Every 3 weeks, gets contractures to L hand. Goes to PT for this. Lasts 2 hours, came tonight cause it lasted 3 hours.  [CG]    Clinical Course User Index [CG] Adel Aden, PA-C   Medical Decision Making Amount and/or Complexity of Data Reviewed Labs: ordered.  Risk Prescription drug management.   53 year old male signed out to me at shift change pending laboratory work and reevaluation.  Please see HPI for further details.  In short, 53 year old male presents to the ED for evaluation of left hand contracture.  CVA 3 years ago, has intermittent left hand contractures lasting typically 2 hours.  Patient presents to ED tonight because his hand contracture lasted for 3 hours.  Denies any other concerns.  Patient signed out to me pending laboratory work and reevaluation.  Previous provider provided patient with 5 mg Valium , checked a CBC, BMP.  Patient CBC does have leukocytosis to 11.5.  Baseline hemoglobin.  Patient nontachycardic or febrile.  No signs of cellulitis to patient left hand.  Metabolic panel shows elevated creatinine however patient is on transplant list for kidney, creatinine is baseline for the patient as of 6  months ago.  On reassessment, patient reports that left hand contracture has decreased.  Will send patient home at this time. Follow-up with PCP.  Stable.         Adel Aden, PA-C 11/23/23 0732    Roberts Ching, MD 12/03/23 1725

## 2023-12-01 ENCOUNTER — Other Ambulatory Visit: Payer: Self-pay | Admitting: Family Medicine

## 2023-12-01 DIAGNOSIS — E1122 Type 2 diabetes mellitus with diabetic chronic kidney disease: Secondary | ICD-10-CM

## 2023-12-20 ENCOUNTER — Ambulatory Visit (HOSPITAL_COMMUNITY)
Admission: RE | Admit: 2023-12-20 | Discharge: 2023-12-20 | Disposition: A | Discharge status: Home / Self Care | Source: Ambulatory Visit | Attending: Nephrology | Admitting: Nephrology

## 2023-12-20 VITALS — BP 131/63 | HR 73 | Temp 97.4°F | Resp 16

## 2023-12-20 DIAGNOSIS — N183 Chronic kidney disease, stage 3 unspecified: Secondary | ICD-10-CM | POA: Insufficient documentation

## 2023-12-20 LAB — RENAL FUNCTION PANEL
Albumin: 3.8 g/dL (ref 3.5–5.0)
Anion gap: 11 (ref 5–15)
BUN: 50 mg/dL — ABNORMAL HIGH (ref 6–20)
CO2: 18 mmol/L — ABNORMAL LOW (ref 22–32)
Calcium: 10.3 mg/dL (ref 8.9–10.3)
Chloride: 109 mmol/L (ref 98–111)
Creatinine, Ser: 6.33 mg/dL — ABNORMAL HIGH (ref 0.61–1.24)
GFR, Estimated: 10 mL/min — ABNORMAL LOW (ref >60)
Glucose, Bld: 112 mg/dL — ABNORMAL HIGH (ref 70–99)
Phosphorus: 3.9 mg/dL (ref 2.5–4.6)
Potassium: 5.3 mmol/L — ABNORMAL HIGH (ref 3.5–5.1)
Sodium: 138 mmol/L (ref 135–145)

## 2023-12-20 LAB — CBC WITH DIFFERENTIAL/PLATELET
Abs Immature Granulocytes: 0.02 10*3/uL (ref 0.00–0.07)
Basophils Absolute: 0.1 10*3/uL (ref 0.0–0.1)
Basophils Relative: 1 %
Eosinophils Absolute: 0.2 10*3/uL (ref 0.0–0.5)
Eosinophils Relative: 2 %
HCT: 37.1 % — ABNORMAL LOW (ref 39.0–52.0)
Hemoglobin: 11.1 g/dL — ABNORMAL LOW (ref 13.0–17.0)
Immature Granulocytes: 0 %
Lymphocytes Relative: 25 %
Lymphs Abs: 2.2 10*3/uL (ref 0.7–4.0)
MCH: 26.7 pg (ref 26.0–34.0)
MCHC: 29.9 g/dL — ABNORMAL LOW (ref 30.0–36.0)
MCV: 89.2 fL (ref 80.0–100.0)
Monocytes Absolute: 0.4 10*3/uL (ref 0.1–1.0)
Monocytes Relative: 5 %
Neutro Abs: 5.7 10*3/uL (ref 1.7–7.7)
Neutrophils Relative %: 67 %
Platelets: 231 10*3/uL (ref 150–400)
RBC: 4.16 MIL/uL — ABNORMAL LOW (ref 4.22–5.81)
RDW: 15.1 % (ref 11.5–15.5)
WBC: 8.5 10*3/uL (ref 4.0–10.5)
nRBC: 0 % (ref 0.0–0.2)

## 2023-12-20 MED ORDER — EPOETIN ALFA-EPBX 20000 UNIT/ML IJ SOLN
20000.0000 [IU] | Freq: Once | INTRAMUSCULAR | Status: AC
  Administered 2023-12-20: 20000 [IU] via SUBCUTANEOUS
  Filled 2023-12-20: qty 1

## 2023-12-20 MED ORDER — EPOETIN ALFA-EPBX 10000 UNIT/ML IJ SOLN
INTRAMUSCULAR | Status: AC
  Filled 2023-12-20: qty 2

## 2023-12-23 LAB — POCT HEMOGLOBIN-HEMACUE: Hemoglobin: 11.1 g/dL — ABNORMAL LOW (ref 13.0–17.0)

## 2024-01-02 DIAGNOSIS — N2581 Secondary hyperparathyroidism of renal origin: Secondary | ICD-10-CM | POA: Diagnosis not present

## 2024-01-02 DIAGNOSIS — D631 Anemia in chronic kidney disease: Secondary | ICD-10-CM | POA: Diagnosis not present

## 2024-01-02 DIAGNOSIS — I120 Hypertensive chronic kidney disease with stage 5 chronic kidney disease or end stage renal disease: Secondary | ICD-10-CM | POA: Diagnosis not present

## 2024-01-02 DIAGNOSIS — N185 Chronic kidney disease, stage 5: Secondary | ICD-10-CM | POA: Diagnosis not present

## 2024-01-02 DIAGNOSIS — E872 Acidosis, unspecified: Secondary | ICD-10-CM | POA: Diagnosis not present

## 2024-01-03 ENCOUNTER — Ambulatory Visit (HOSPITAL_COMMUNITY)
Admission: RE | Admit: 2024-01-03 | Discharge: 2024-01-03 | Disposition: A | Discharge status: Home / Self Care | Source: Ambulatory Visit | Attending: Nephrology | Admitting: Nephrology

## 2024-01-03 VITALS — BP 120/60 | HR 78 | Temp 97.1°F | Resp 16

## 2024-01-03 DIAGNOSIS — N183 Chronic kidney disease, stage 3 unspecified: Secondary | ICD-10-CM

## 2024-01-03 LAB — POCT HEMOGLOBIN-HEMACUE: Hemoglobin: 11 g/dL — ABNORMAL LOW (ref 13.0–17.0)

## 2024-01-03 MED ORDER — EPOETIN ALFA-EPBX 10000 UNIT/ML IJ SOLN
20000.0000 [IU] | INTRAMUSCULAR | Status: DC
  Administered 2024-01-03: 20000 [IU] via SUBCUTANEOUS

## 2024-01-03 MED ORDER — EPOETIN ALFA-EPBX 10000 UNIT/ML IJ SOLN
INTRAMUSCULAR | Status: AC
  Filled 2024-01-03: qty 2

## 2024-01-22 ENCOUNTER — Other Ambulatory Visit: Payer: Self-pay | Admitting: Family Medicine

## 2024-01-22 DIAGNOSIS — E1169 Type 2 diabetes mellitus with other specified complication: Secondary | ICD-10-CM

## 2024-01-23 ENCOUNTER — Encounter: Payer: Self-pay | Admitting: Podiatry

## 2024-01-23 ENCOUNTER — Ambulatory Visit (INDEPENDENT_AMBULATORY_CARE_PROVIDER_SITE_OTHER): Admitting: Podiatry

## 2024-01-23 DIAGNOSIS — B351 Tinea unguium: Secondary | ICD-10-CM

## 2024-01-23 DIAGNOSIS — N183 Chronic kidney disease, stage 3 unspecified: Secondary | ICD-10-CM

## 2024-01-23 DIAGNOSIS — N185 Chronic kidney disease, stage 5: Secondary | ICD-10-CM | POA: Diagnosis not present

## 2024-01-23 DIAGNOSIS — E1165 Type 2 diabetes mellitus with hyperglycemia: Secondary | ICD-10-CM

## 2024-01-23 NOTE — Progress Notes (Signed)
This patient returns to my office for at risk foot care.  This patient requires this care by a professional since this patient will be at risk due to having CKD and diabetes and CVA..  This patient is unable to cut nails himself since the patient cannot reach his nails.These nails are painful walking and wearing shoes.  This patient presents for at risk foot care today.  General Appearance  Alert, conversant and in no acute stress.  Vascular  Dorsalis pedis and posterior tibial  pulses are weakly palpable  bilaterally.  Capillary return is within normal limits  bilaterally. Temperature is within normal limits  bilaterally.  Neurologic  Senn-Weinstein monofilament wire test within normal limits  bilaterally. Muscle power within normal limits bilaterally.  Nails Thick disfigured discolored nails with subungual debris  from hallux to fifth toes bilaterally. No evidence of bacterial infection or drainage bilaterally.  Orthopedic  No limitations of motion  feet .  No crepitus or effusions noted.  No bony pathology or digital deformities noted.  Skin  dry scaly skin with no porokeratosis noted bilaterally.  No signs of infections or ulcers noted.     Onychomycosis  Pain in right toes  Pain in left toes  Consent was obtained for treatment procedures.   Mechanical debridement of nails 1-5  bilaterally performed with a nail nipper.  Filed with dremel without incident.    Return office visit   3 months                   Told patient to return for periodic foot care and evaluation due to potential at risk complications.   Helane Gunther DPM

## 2024-01-29 ENCOUNTER — Telehealth: Payer: Self-pay | Admitting: Pharmacy Technician

## 2024-01-29 NOTE — Telephone Encounter (Signed)
 Auth Submission: NO AUTH NEEDED Site of care: Site of care: [facility] Payer: [employer]HC DUAL Medication & CPT/J Code(s) submitted: [id number] RETACRIT  Diagnosis Code:  Route of submission (phone, fax, portal):  Phone # Fax # Auth type: Buy/Bill HB Units/visits requested: 20000U q 14 days Reference number: [id number] Approval from: 01/29/24 to 08/05/24     Dagoberto Armour, CPhT

## 2024-01-31 ENCOUNTER — Encounter (HOSPITAL_COMMUNITY)
Admission: RE | Admit: 2024-01-31 | Discharge: 2024-01-31 | Disposition: A | Discharge status: Home / Self Care | Source: Ambulatory Visit | Attending: Nephrology

## 2024-01-31 VITALS — BP 123/64 | HR 76 | Temp 97.8°F | Resp 16

## 2024-01-31 DIAGNOSIS — D631 Anemia in chronic kidney disease: Secondary | ICD-10-CM | POA: Insufficient documentation

## 2024-01-31 DIAGNOSIS — N183 Chronic kidney disease, stage 3 unspecified: Secondary | ICD-10-CM

## 2024-01-31 DIAGNOSIS — E1122 Type 2 diabetes mellitus with diabetic chronic kidney disease: Secondary | ICD-10-CM | POA: Insufficient documentation

## 2024-01-31 DIAGNOSIS — N184 Chronic kidney disease, stage 4 (severe): Secondary | ICD-10-CM | POA: Insufficient documentation

## 2024-01-31 LAB — RENAL FUNCTION PANEL
Albumin: 3.7 g/dL (ref 3.5–5.0)
Anion gap: 9 (ref 5–15)
BUN: 55 mg/dL — ABNORMAL HIGH (ref 6–20)
CO2: 20 mmol/L — ABNORMAL LOW (ref 22–32)
Calcium: 10 mg/dL (ref 8.9–10.3)
Chloride: 109 mmol/L (ref 98–111)
Creatinine, Ser: 6.08 mg/dL — ABNORMAL HIGH (ref 0.61–1.24)
GFR, Estimated: 10 mL/min — ABNORMAL LOW (ref >60)
Glucose, Bld: 131 mg/dL — ABNORMAL HIGH (ref 70–99)
Phosphorus: 4.6 mg/dL (ref 2.5–4.6)
Potassium: 5 mmol/L (ref 3.5–5.1)
Sodium: 138 mmol/L (ref 135–145)

## 2024-01-31 LAB — CBC WITH DIFFERENTIAL/PLATELET
Abs Immature Granulocytes: 0.03 10*3/uL (ref 0.00–0.07)
Basophils Absolute: 0.1 10*3/uL (ref 0.0–0.1)
Basophils Relative: 1 %
Eosinophils Absolute: 0.2 10*3/uL (ref 0.0–0.5)
Eosinophils Relative: 3 %
HCT: 37.1 % — ABNORMAL LOW (ref 39.0–52.0)
Hemoglobin: 11.5 g/dL — ABNORMAL LOW (ref 13.0–17.0)
Immature Granulocytes: 0 %
Lymphocytes Relative: 27 %
Lymphs Abs: 2.4 10*3/uL (ref 0.7–4.0)
MCH: 26.9 pg (ref 26.0–34.0)
MCHC: 31 g/dL (ref 30.0–36.0)
MCV: 86.9 fL (ref 80.0–100.0)
Monocytes Absolute: 0.5 10*3/uL (ref 0.1–1.0)
Monocytes Relative: 6 %
Neutro Abs: 5.7 10*3/uL (ref 1.7–7.7)
Neutrophils Relative %: 63 %
Platelets: 273 10*3/uL (ref 150–400)
RBC: 4.27 MIL/uL (ref 4.22–5.81)
RDW: 15.4 % (ref 11.5–15.5)
WBC: 9 10*3/uL (ref 4.0–10.5)
nRBC: 0 % (ref 0.0–0.2)

## 2024-01-31 LAB — POCT HEMOGLOBIN-HEMACUE: Hemoglobin: 11.5 g/dL — ABNORMAL LOW (ref 13.0–17.0)

## 2024-01-31 MED ORDER — EPOETIN ALFA-EPBX 20000 UNIT/ML IJ SOLN
20000.0000 [IU] | INTRAMUSCULAR | Status: DC
  Administered 2024-01-31: 20000 [IU] via SUBCUTANEOUS
  Filled 2024-01-31: qty 1

## 2024-01-31 MED ORDER — EPOETIN ALFA-EPBX 10000 UNIT/ML IJ SOLN
INTRAMUSCULAR | Status: AC
  Filled 2024-01-31: qty 2

## 2024-02-02 ENCOUNTER — Other Ambulatory Visit: Payer: Self-pay | Admitting: Family Medicine

## 2024-02-03 DIAGNOSIS — N2581 Secondary hyperparathyroidism of renal origin: Secondary | ICD-10-CM | POA: Diagnosis not present

## 2024-02-03 DIAGNOSIS — I129 Hypertensive chronic kidney disease with stage 1 through stage 4 chronic kidney disease, or unspecified chronic kidney disease: Secondary | ICD-10-CM | POA: Diagnosis not present

## 2024-02-03 DIAGNOSIS — E872 Acidosis, unspecified: Secondary | ICD-10-CM | POA: Diagnosis not present

## 2024-02-03 DIAGNOSIS — I120 Hypertensive chronic kidney disease with stage 5 chronic kidney disease or end stage renal disease: Secondary | ICD-10-CM | POA: Diagnosis not present

## 2024-02-03 DIAGNOSIS — N189 Chronic kidney disease, unspecified: Secondary | ICD-10-CM | POA: Diagnosis not present

## 2024-02-03 DIAGNOSIS — D631 Anemia in chronic kidney disease: Secondary | ICD-10-CM | POA: Diagnosis not present

## 2024-02-03 DIAGNOSIS — N185 Chronic kidney disease, stage 5: Secondary | ICD-10-CM | POA: Diagnosis not present

## 2024-02-04 ENCOUNTER — Other Ambulatory Visit: Payer: Self-pay | Admitting: Pharmacist

## 2024-02-04 NOTE — Telephone Encounter (Signed)
 Received refill request for rosuvastatin  40 mg daily. Pt with renal function contraindicative to this dose. With his degree of renal impairment, max recommended dose is 10 mg daily. Routing information to pt's PCP so she is aware. I have pended a refill for 10 mg daily instead.

## 2024-02-08 ENCOUNTER — Other Ambulatory Visit: Payer: Self-pay | Admitting: Family Medicine

## 2024-02-16 ENCOUNTER — Other Ambulatory Visit: Payer: Self-pay | Admitting: Family Medicine

## 2024-02-24 ENCOUNTER — Other Ambulatory Visit: Payer: Self-pay | Admitting: Family Medicine

## 2024-02-24 DIAGNOSIS — K2100 Gastro-esophageal reflux disease with esophagitis, without bleeding: Secondary | ICD-10-CM

## 2024-02-28 ENCOUNTER — Other Ambulatory Visit: Payer: Self-pay | Admitting: Family Medicine

## 2024-02-28 ENCOUNTER — Ambulatory Visit (HOSPITAL_COMMUNITY)
Admission: RE | Admit: 2024-02-28 | Discharge: 2024-02-28 | Disposition: A | Discharge status: Home / Self Care | Source: Ambulatory Visit | Attending: Nephrology | Admitting: Nephrology

## 2024-02-28 VITALS — BP 138/66 | HR 83 | Temp 97.1°F | Resp 16

## 2024-02-28 DIAGNOSIS — N2581 Secondary hyperparathyroidism of renal origin: Secondary | ICD-10-CM | POA: Diagnosis not present

## 2024-02-28 DIAGNOSIS — N183 Chronic kidney disease, stage 3 unspecified: Secondary | ICD-10-CM | POA: Diagnosis not present

## 2024-02-28 DIAGNOSIS — I120 Hypertensive chronic kidney disease with stage 5 chronic kidney disease or end stage renal disease: Secondary | ICD-10-CM | POA: Diagnosis not present

## 2024-02-28 DIAGNOSIS — E872 Acidosis, unspecified: Secondary | ICD-10-CM | POA: Diagnosis not present

## 2024-02-28 DIAGNOSIS — K2100 Gastro-esophageal reflux disease with esophagitis, without bleeding: Secondary | ICD-10-CM

## 2024-02-28 DIAGNOSIS — D631 Anemia in chronic kidney disease: Secondary | ICD-10-CM | POA: Diagnosis not present

## 2024-02-28 DIAGNOSIS — E1169 Type 2 diabetes mellitus with other specified complication: Secondary | ICD-10-CM

## 2024-02-28 DIAGNOSIS — N185 Chronic kidney disease, stage 5: Secondary | ICD-10-CM | POA: Diagnosis not present

## 2024-02-28 LAB — FERRITIN: Ferritin: 321 ng/mL (ref 24–336)

## 2024-02-28 LAB — CBC WITH DIFFERENTIAL/PLATELET
Abs Immature Granulocytes: 0.06 K/uL (ref 0.00–0.07)
Basophils Absolute: 0.1 K/uL (ref 0.0–0.1)
Basophils Relative: 1 %
Eosinophils Absolute: 0.3 K/uL (ref 0.0–0.5)
Eosinophils Relative: 3 %
HCT: 33.7 % — ABNORMAL LOW (ref 39.0–52.0)
Hemoglobin: 10.3 g/dL — ABNORMAL LOW (ref 13.0–17.0)
Immature Granulocytes: 1 %
Lymphocytes Relative: 22 %
Lymphs Abs: 2.1 K/uL (ref 0.7–4.0)
MCH: 26.2 pg (ref 26.0–34.0)
MCHC: 30.6 g/dL (ref 30.0–36.0)
MCV: 85.8 fL (ref 80.0–100.0)
Monocytes Absolute: 0.7 K/uL (ref 0.1–1.0)
Monocytes Relative: 7 %
Neutro Abs: 6.6 K/uL (ref 1.7–7.7)
Neutrophils Relative %: 66 %
Platelets: 279 K/uL (ref 150–400)
RBC: 3.93 MIL/uL — ABNORMAL LOW (ref 4.22–5.81)
RDW: 15.3 % (ref 11.5–15.5)
WBC: 9.9 K/uL (ref 4.0–10.5)
nRBC: 0 % (ref 0.0–0.2)

## 2024-02-28 LAB — RENAL FUNCTION PANEL
Albumin: 3.4 g/dL — ABNORMAL LOW (ref 3.5–5.0)
Anion gap: 11 (ref 5–15)
BUN: 52 mg/dL — ABNORMAL HIGH (ref 6–20)
CO2: 21 mmol/L — ABNORMAL LOW (ref 22–32)
Calcium: 9.8 mg/dL (ref 8.9–10.3)
Chloride: 104 mmol/L (ref 98–111)
Creatinine, Ser: 5.38 mg/dL — ABNORMAL HIGH (ref 0.61–1.24)
GFR, Estimated: 12 mL/min — ABNORMAL LOW (ref >60)
Glucose, Bld: 165 mg/dL — ABNORMAL HIGH (ref 70–99)
Phosphorus: 4.2 mg/dL (ref 2.5–4.6)
Potassium: 5 mmol/L (ref 3.5–5.1)
Sodium: 136 mmol/L (ref 135–145)

## 2024-02-28 LAB — IRON AND TIBC
Iron: 58 ug/dL (ref 45–182)
Saturation Ratios: 27 % (ref 17.9–39.5)
TIBC: 218 ug/dL — ABNORMAL LOW (ref 250–450)
UIBC: 160 ug/dL

## 2024-02-28 LAB — POCT HEMOGLOBIN-HEMACUE: Hemoglobin: 10.4 g/dL — ABNORMAL LOW (ref 13.0–17.0)

## 2024-02-28 MED ORDER — EPOETIN ALFA-EPBX 10000 UNIT/ML IJ SOLN
INTRAMUSCULAR | Status: AC
Start: 2024-02-28 — End: 2024-02-28
  Filled 2024-02-28: qty 2

## 2024-02-28 MED ORDER — EPOETIN ALFA-EPBX 10000 UNIT/ML IJ SOLN
20000.0000 [IU] | INTRAMUSCULAR | Status: DC
  Administered 2024-02-28: 20000 [IU] via SUBCUTANEOUS

## 2024-02-28 NOTE — Telephone Encounter (Signed)
 Copied from CRM [id number]. Topic: Clinical - Medication Refill >> Feb 28, 2024 12:26 PM Yolanda T wrote: Medication: omeprazole  (PRILOSEC) 40 MG capsule, rosuvastatin  (CRESTOR ) 40 MG tablet, furosemide  (LASIX ) 80 MG tablet and glipiZIDE  (GLUCOTROL ) 5 MG tablet  Has the patient contacted their pharmacy? Yes  This is the patient's preferred pharmacy:  [facility] [address] ([address] [place] - [address] [place] ([place]) [place] [address] Phone: [phone] Fax: [phone]  Is this the correct pharmacy for this prescription? Yes  Has the prescription been filled recently? Yes  Is the patient out of the medication? Yes  Has the patient been seen for an appointment in the last year OR does the patient have an upcoming appointment? Yes  Can we respond through MyChart? Yes  Agent: Please be advised that Rx refills may take up to 3 business days. We ask that you follow-up with your pharmacy.

## 2024-03-02 MED ORDER — OMEPRAZOLE 40 MG PO CPDR: 40.0000 mg | DELAYED_RELEASE_CAPSULE | Freq: Every day | ORAL | 0 refills | Status: DC

## 2024-03-02 MED ORDER — ROSUVASTATIN CALCIUM 40 MG PO TABS: 40.0000 mg | ORAL_TABLET | Freq: Every day | ORAL | 0 refills | Status: DC

## 2024-03-02 NOTE — Telephone Encounter (Signed)
 Too soon for refill for glipizide .  Requested Prescriptions  Pending Prescriptions Disp Refills   omeprazole  (PRILOSEC) 40 MG capsule 90 capsule 0    Sig: Take 1 capsule (40 mg total) by mouth daily.     Gastroenterology: Proton Pump Inhibitors Passed - 03/02/2024 11:46 AM      Passed - Valid encounter within last 12 months    Recent Outpatient Visits           3 months ago Encounter for Medicare annual wellness exam   [place] [facility] [place]. [facility] Delbert Clam, MD   1 year ago Type 2 diabetes mellitus with other specified complication, without long-term current use of insulin  [facility])   [place] [facility] [place]. [facility] Delbert Clam, MD   1 year ago Type 2 diabetes mellitus with other specified complication, without long-term current use of insulin  [facility])   [place] [facility] Dept Of [place]. [facility] Delbert Clam, MD   2 years ago Type 2 diabetes mellitus with other specified complication, without long-term current use of insulin  [facility])   [place] [facility] Dept Of [place]. [facility] Delbert Clam, MD   2 years ago Hypertension in stage 4 chronic kidney disease due to type 2 diabetes mellitus [facility])   [place] [facility] Of [place]. [facility] Fleeta Morris, Stephen L, RPH-CPP               rosuvastatin  (CRESTOR ) 40 MG tablet 90 tablet 0    Sig: Take 1 tablet (40 mg total) by mouth daily.     Cardiovascular:  Antilipid - Statins 2 Failed - 03/02/2024 11:46 AM      Failed - Cr in normal range and within 360 days    Creatinine, Ser  Date Value Ref Range Status  02/28/2024 5.38 (H) 0.61 - 1.24 mg/dL Final   Creatinine, Urine  Date Value Ref Range Status  06/11/2020 87.72 mg/dL Final    Comment:    Performed at [facility], [address], [place], [place] [address]         Failed -  Lipid Panel in normal range within the last 12 months    Cholesterol, Total  Date Value Ref Range Status  01/11/2023 212 (H) 100 - 199 mg/dL Final   LDL Chol Calc (NIH)  Date Value Ref Range Status  01/11/2023 130 (H) 0 - 99 mg/dL Final   HDL  Date Value Ref Range Status  01/11/2023 61 >39 mg/dL Final   Triglycerides  Date Value Ref Range Status  01/11/2023 116 0 - 149 mg/dL Final         Passed - Patient is not pregnant      Passed - Valid encounter within last 12 months    Recent Outpatient Visits           3 months ago Encounter for [employer] annual wellness exam   [place] [facility] [place]. [facility] Delbert Clam, MD   1 year ago Type 2 diabetes mellitus with other specified complication, without long-term current use of insulin  [facility])    [facility] - A Dept Of [place]. [facility] Delbert Clam, MD   1 year ago Type 2 diabetes mellitus with other specified  complication, without long-term current use of insulin  [facility])   [place] [facility] Dept Of [place]. [facility] Delbert Clam, MD   2 years ago Type 2 diabetes mellitus with other specified complication, without long-term current use of insulin  [facility])   [place] [facility] A Dept Of [place]. [facility] Delbert Clam, MD   2 years ago Hypertension in stage 4 chronic kidney disease due to type 2 diabetes mellitus [facility])   [place] [facility] Dept Of [place]. [facility] Fleeta Morris, Stephen L, RPH-CPP               glipiZIDE  (GLUCOTROL ) 5 MG tablet 180 tablet 1    Sig: Take 1 tablet (5 mg total) by mouth 2 (two) times daily before a meal.     Endocrinology:  Diabetes - Sulfonylureas Failed - 03/02/2024 11:46 AM      Failed - Cr in normal range and within 360 days    Creatinine, Ser  Date Value Ref Range Status  02/28/2024 5.38 (H) 0.61 - 1.24 mg/dL Final    Creatinine, Urine  Date Value Ref Range Status  06/11/2020 87.72 mg/dL Final    Comment:    Performed at [facility], [address] [place], [place] [address]         Passed - HBA1C is between 0 and 7.9 and within 180 days    HbA1c, POC (controlled diabetic range)  Date Value Ref Range Status  11/05/2023 6.6 0.0 - 7.0 % Final         Passed - Valid encounter within last 6 months    Recent Outpatient Visits           3 months ago Encounter for [employer] annual wellness exam   [place]  [facility] [place] [facility] Of [place]. [facility] Delbert Clam, MD   1 year ago Type 2 diabetes mellitus with other specified complication, without long-term current use of insulin  [facility])   [place] [facility] Dept Of [place]. [facility] Delbert Clam, MD   1 year ago Type 2 diabetes mellitus with other specified complication, without long-term current use of insulin  [facility])   [place] [facility] Dept Of [place]. [facility] Delbert Clam, MD   2 years ago Type 2 diabetes mellitus with other specified complication, without long-term current use of insulin  [facility])   [place] [facility] - A Dept Of . [facility] Delbert Clam, MD   2 years ago Hypertension in stage 4 chronic kidney disease due to type 2 diabetes mellitus [facility])    [facility] A Dept Of . [facility] Fleeta Morris Garnette LITTIE, RPH-CPP

## 2024-03-27 ENCOUNTER — Ambulatory Visit (HOSPITAL_COMMUNITY)
Admission: RE | Admit: 2024-03-27 | Discharge: 2024-03-27 | Disposition: A | Discharge status: Home / Self Care | Source: Ambulatory Visit | Attending: Nephrology | Admitting: Nephrology

## 2024-03-27 VITALS — BP 134/65 | HR 76 | Temp 97.5°F | Resp 16

## 2024-03-27 DIAGNOSIS — N183 Chronic kidney disease, stage 3 unspecified: Secondary | ICD-10-CM | POA: Diagnosis not present

## 2024-03-27 LAB — RENAL FUNCTION PANEL
Albumin: 3.5 g/dL (ref 3.5–5.0)
Anion gap: 9 (ref 5–15)
BUN: 51 mg/dL — ABNORMAL HIGH (ref 6–20)
CO2: 17 mmol/L — ABNORMAL LOW (ref 22–32)
Calcium: 10.2 mg/dL (ref 8.9–10.3)
Chloride: 112 mmol/L — ABNORMAL HIGH (ref 98–111)
Creatinine, Ser: 5.25 mg/dL — ABNORMAL HIGH (ref 0.61–1.24)
GFR, Estimated: 12 mL/min — ABNORMAL LOW (ref >60)
Glucose, Bld: 108 mg/dL — ABNORMAL HIGH (ref 70–99)
Phosphorus: 4.7 mg/dL — ABNORMAL HIGH (ref 2.5–4.6)
Potassium: 4.6 mmol/L (ref 3.5–5.1)
Sodium: 138 mmol/L (ref 135–145)

## 2024-03-27 LAB — CBC WITH DIFFERENTIAL/PLATELET
Abs Immature Granulocytes: 0.02 K/uL (ref 0.00–0.07)
Basophils Absolute: 0.1 K/uL (ref 0.0–0.1)
Basophils Relative: 1 %
Eosinophils Absolute: 0.3 K/uL (ref 0.0–0.5)
Eosinophils Relative: 4 %
HCT: 38.6 % — ABNORMAL LOW (ref 39.0–52.0)
Hemoglobin: 11.9 g/dL — ABNORMAL LOW (ref 13.0–17.0)
Immature Granulocytes: 0 %
Lymphocytes Relative: 27 %
Lymphs Abs: 2.3 K/uL (ref 0.7–4.0)
MCH: 26.1 pg (ref 26.0–34.0)
MCHC: 30.8 g/dL (ref 30.0–36.0)
MCV: 84.6 fL (ref 80.0–100.0)
Monocytes Absolute: 0.5 K/uL (ref 0.1–1.0)
Monocytes Relative: 6 %
Neutro Abs: 5.4 K/uL (ref 1.7–7.7)
Neutrophils Relative %: 62 %
Platelets: 310 K/uL (ref 150–400)
RBC: 4.56 MIL/uL (ref 4.22–5.81)
RDW: 15.7 % — ABNORMAL HIGH (ref 11.5–15.5)
WBC: 8.6 K/uL (ref 4.0–10.5)
nRBC: 0 % (ref 0.0–0.2)

## 2024-03-27 LAB — POCT HEMOGLOBIN-HEMACUE: Hemoglobin: 12.1 g/dL — ABNORMAL LOW (ref 13.0–17.0)

## 2024-03-27 MED ORDER — EPOETIN ALFA-EPBX 10000 UNIT/ML IJ SOLN: 20000.0000 [IU] | INTRAMUSCULAR | Status: DC

## 2024-03-27 MED ORDER — EPOETIN ALFA-EPBX 10000 UNIT/ML IJ SOLN
INTRAMUSCULAR | Status: AC
  Filled 2024-03-27: qty 2

## 2024-03-30 DIAGNOSIS — I639 Cerebral infarction, unspecified: Secondary | ICD-10-CM | POA: Diagnosis not present

## 2024-03-30 DIAGNOSIS — E1165 Type 2 diabetes mellitus with hyperglycemia: Secondary | ICD-10-CM | POA: Diagnosis not present

## 2024-03-30 DIAGNOSIS — E1159 Type 2 diabetes mellitus with other circulatory complications: Secondary | ICD-10-CM | POA: Diagnosis not present

## 2024-03-30 DIAGNOSIS — Z01818 Encounter for other preprocedural examination: Secondary | ICD-10-CM | POA: Diagnosis not present

## 2024-03-30 DIAGNOSIS — I152 Hypertension secondary to endocrine disorders: Secondary | ICD-10-CM | POA: Diagnosis not present

## 2024-04-10 ENCOUNTER — Ambulatory Visit (HOSPITAL_COMMUNITY)
Admission: RE | Admit: 2024-04-10 | Discharge: 2024-04-10 | Disposition: A | Discharge status: Home / Self Care | Source: Ambulatory Visit | Attending: Nephrology | Admitting: Nephrology

## 2024-04-10 VITALS — BP 162/70 | HR 79 | Temp 97.0°F | Resp 16

## 2024-04-10 DIAGNOSIS — N183 Chronic kidney disease, stage 3 unspecified: Secondary | ICD-10-CM | POA: Insufficient documentation

## 2024-04-10 DIAGNOSIS — D631 Anemia in chronic kidney disease: Secondary | ICD-10-CM | POA: Insufficient documentation

## 2024-04-10 LAB — POCT HEMOGLOBIN-HEMACUE: Hemoglobin: 11 g/dL — ABNORMAL LOW (ref 13.0–17.0)

## 2024-04-10 MED ORDER — EPOETIN ALFA-EPBX 10000 UNIT/ML IJ SOLN
INTRAMUSCULAR | Status: AC
  Filled 2024-04-10: qty 2

## 2024-04-10 MED ORDER — EPOETIN ALFA-EPBX 20000 UNIT/ML IJ SOLN
20000.0000 [IU] | INTRAMUSCULAR | Status: DC
  Administered 2024-04-10: 20000 [IU] via SUBCUTANEOUS

## 2024-04-17 DIAGNOSIS — D631 Anemia in chronic kidney disease: Secondary | ICD-10-CM | POA: Insufficient documentation

## 2024-04-24 ENCOUNTER — Inpatient Hospital Stay (HOSPITAL_COMMUNITY): Admission: RE | Admit: 2024-04-24 | Source: Ambulatory Visit

## 2024-04-29 DIAGNOSIS — E559 Vitamin D deficiency, unspecified: Secondary | ICD-10-CM | POA: Diagnosis not present

## 2024-04-29 DIAGNOSIS — D631 Anemia in chronic kidney disease: Secondary | ICD-10-CM | POA: Diagnosis not present

## 2024-04-29 DIAGNOSIS — E872 Acidosis, unspecified: Secondary | ICD-10-CM | POA: Diagnosis not present

## 2024-04-29 DIAGNOSIS — N185 Chronic kidney disease, stage 5: Secondary | ICD-10-CM | POA: Diagnosis not present

## 2024-04-29 DIAGNOSIS — N2581 Secondary hyperparathyroidism of renal origin: Secondary | ICD-10-CM | POA: Diagnosis not present

## 2024-04-29 DIAGNOSIS — I120 Hypertensive chronic kidney disease with stage 5 chronic kidney disease or end stage renal disease: Secondary | ICD-10-CM | POA: Diagnosis not present

## 2024-04-30 ENCOUNTER — Encounter (HOSPITAL_COMMUNITY): Payer: Self-pay | Admitting: Nephrology

## 2024-04-30 ENCOUNTER — Encounter: Payer: Self-pay | Admitting: Podiatry

## 2024-04-30 ENCOUNTER — Ambulatory Visit (INDEPENDENT_AMBULATORY_CARE_PROVIDER_SITE_OTHER): Admitting: Podiatry

## 2024-04-30 DIAGNOSIS — E1165 Type 2 diabetes mellitus with hyperglycemia: Secondary | ICD-10-CM

## 2024-04-30 DIAGNOSIS — B351 Tinea unguium: Secondary | ICD-10-CM

## 2024-04-30 DIAGNOSIS — N183 Chronic kidney disease, stage 3 unspecified: Secondary | ICD-10-CM | POA: Diagnosis not present

## 2024-04-30 NOTE — Progress Notes (Signed)
This patient returns to my office for at risk foot care.  This patient requires this care by a professional since this patient will be at risk due to having CKD and diabetes and CVA..  This patient is unable to cut nails himself since the patient cannot reach his nails.These nails are painful walking and wearing shoes.  This patient presents for at risk foot care today.  General Appearance  Alert, conversant and in no acute stress.  Vascular  Dorsalis pedis and posterior tibial  pulses are weakly palpable  bilaterally.  Capillary return is within normal limits  bilaterally. Temperature is within normal limits  bilaterally.  Neurologic  Senn-Weinstein monofilament wire test within normal limits  bilaterally. Muscle power within normal limits bilaterally.  Nails Thick disfigured discolored nails with subungual debris  from hallux to fifth toes bilaterally. No evidence of bacterial infection or drainage bilaterally.  Orthopedic  No limitations of motion  feet .  No crepitus or effusions noted.  No bony pathology or digital deformities noted.  Skin  dry scaly skin with no porokeratosis noted bilaterally.  No signs of infections or ulcers noted.     Onychomycosis  Pain in right toes  Pain in left toes  Consent was obtained for treatment procedures.   Mechanical debridement of nails 1-5  bilaterally performed with a nail nipper.  Filed with dremel without incident.    Return office visit   3 months                   Told patient to return for periodic foot care and evaluation due to potential at risk complications.   Helane Gunther DPM

## 2024-05-01 ENCOUNTER — Encounter (HOSPITAL_COMMUNITY): Payer: Self-pay | Admitting: Nephrology

## 2024-05-02 LAB — LAB REPORT - SCANNED: PTH: 293

## 2024-05-06 ENCOUNTER — Ambulatory Visit: Admitting: Family Medicine

## 2024-05-07 NOTE — Progress Notes (Signed)
 Tony Schmitt                                          MRN: [id number]   05/07/2024   The [facility] Quality Team Specialist reviewed this patient medical record for the purposes of chart review for care gap closure. The following were reviewed: chart review for care gap closure-diabetic eye exam.    [facility]BCI Quality Team

## 2024-05-08 ENCOUNTER — Other Ambulatory Visit: Payer: Self-pay | Admitting: Pharmacist

## 2024-05-08 ENCOUNTER — Inpatient Hospital Stay (HOSPITAL_COMMUNITY)
Admission: RE | Admit: 2024-05-08 | Discharge: 2024-05-08 | Disposition: A | Discharge status: Home / Self Care | Source: Ambulatory Visit | Attending: Nephrology | Admitting: Nephrology

## 2024-05-08 DIAGNOSIS — D631 Anemia in chronic kidney disease: Secondary | ICD-10-CM

## 2024-05-08 NOTE — Progress Notes (Signed)
 Pharmacy Quality Measure Review  This patient is appearing on a report for being at risk of failing the adherence measure for cholesterol (statin) medications this calendar year.   Medication: rosuvastatin  Last fill date: 03/05/2024 for 90 day supply  Insurance report was not up to date. No action needed at this time.   Herlene Fleeta Morris, PharmD, JAQUELINE, CPP Clinical Pharmacist [facility] [phone]

## 2024-05-18 ENCOUNTER — Other Ambulatory Visit: Payer: Self-pay | Admitting: Family Medicine

## 2024-05-18 DIAGNOSIS — E1122 Type 2 diabetes mellitus with diabetic chronic kidney disease: Secondary | ICD-10-CM

## 2024-05-26 ENCOUNTER — Other Ambulatory Visit: Payer: Self-pay | Admitting: Family Medicine

## 2024-05-26 DIAGNOSIS — E1122 Type 2 diabetes mellitus with diabetic chronic kidney disease: Secondary | ICD-10-CM

## 2024-05-26 NOTE — Telephone Encounter (Signed)
 Copied from CRM #[id number]. Topic: Clinical - Medication Refill >> May 26, 2024  1:04 PM Pinkey ORN wrote: Medication: amLODipine  (NORVASC ) 10 MG tablet  Has the patient contacted their pharmacy? No (Agent: If no, request that the patient contact the pharmacy for the refill. If patient does not wish to contact the pharmacy document the reason why and proceed with request.) (Agent: If yes, when and what did the pharmacy advise?)  This is the patient's preferred pharmacy:  [facility] [address] ([address] [place] - [address] [place] ([place]) [place] [address] Phone: [phone] Fax: [phone]  Is this the correct pharmacy for this prescription? Yes If no, delete pharmacy and type the correct one.   Has the prescription been filled recently? Yes  Is the patient out of the medication? No  Has the patient been seen for an appointment in the last year OR does the patient have an upcoming appointment? Yes  Can we respond through MyChart? Yes  Agent: Please be advised that Rx refills may take up to 3 business days. We ask that you follow-up with your pharmacy.

## 2024-05-28 MED ORDER — AMLODIPINE BESYLATE 10 MG PO TABS: 10.0000 mg | ORAL_TABLET | Freq: Every day | ORAL | 0 refills | Status: DC

## 2024-05-28 NOTE — Telephone Encounter (Signed)
 Requested Prescriptions  Pending Prescriptions Disp Refills   amLODipine  (NORVASC ) 10 MG tablet 90 tablet 0    Sig: Take 1 tablet (10 mg total) by mouth daily.     Cardiovascular: Calcium  Channel Blockers 2 Failed - 05/28/2024 12:11 PM      Failed - Last BP in normal range    BP Readings from Last 1 Encounters:  04/10/24 (!) 162/70         Failed - Valid encounter within last 6 months    Recent Outpatient Visits           6 months ago Encounter for [employer] annual wellness exam   [place] [facility] [place] [facility] [place]. [facility] Delbert Clam, MD   1 year ago Type 2 diabetes mellitus with other specified complication, without long-term current use of insulin  [facility]   [place] [facility] Dept Of [place]. [facility] Delbert Clam, MD   1 year ago Type 2 diabetes mellitus with other specified complication, without long-term current use of insulin  [facility])   [place] [facility] A Dept Of [place]. [facility] Delbert Clam, MD   2 years ago Type 2 diabetes mellitus with other specified complication, without long-term current use of insulin  [facility])   [place] [facility] A Dept Of [place]. [facility] Delbert Clam, MD   2 years ago Hypertension in stage 4 chronic kidney disease due to type 2 diabetes mellitus [facility])   [place] [facility] Dept Of [place]. [facility] [place]elle, [place] L, RPH-CPP              Passed - Last Heart Rate in normal range    Pulse Readings from Last 1 Encounters:  04/10/24 79

## 2024-06-01 ENCOUNTER — Ambulatory Visit: Admitting: Family Medicine

## 2024-06-01 ENCOUNTER — Other Ambulatory Visit: Payer: Self-pay | Admitting: Pharmacist

## 2024-06-01 MED ORDER — ROSUVASTATIN CALCIUM 40 MG PO TABS: 40.0000 mg | ORAL_TABLET | Freq: Every day | ORAL | 0 refills | Status: DC

## 2024-06-02 ENCOUNTER — Telehealth: Payer: Self-pay

## 2024-06-02 NOTE — Telephone Encounter (Signed)
 Copied from CRM [id number]. Topic: Clinical - Medical Advice >> Jun 02, 2024  4:43 PM Shanda MATSU wrote: Reason for CRM: Patient is calling to see if it is okay for him to put black sea oil on his feet if he is a diabetic. >> Jun 02, 2024  4:45 PM Shanda MATSU wrote: Patient also wanted it notes he has kidney disease as well.

## 2024-06-02 NOTE — Telephone Encounter (Signed)
 From an interaction standpoint, I think we are good. Most of this will be absorbed only topically and not systemically. With that being said it's important to keep feet clean and dry as much as possible. The last thing I would want him to to do is apply oil to an open wound and cracked skin. Thus, increasing his chance of infection.

## 2024-06-03 ENCOUNTER — Other Ambulatory Visit: Payer: Self-pay | Admitting: Pharmacist

## 2024-06-03 NOTE — Progress Notes (Signed)
 Pharmacy Quality Measure Review  This patient is appearing on a report for being at risk of failing the adherence measure for cholesterol (statin) medications this calendar year.   Medication: rosuvastatin  Last fill date: 03/05/2024 for 90 day supply  Call placed to pharmacy. His rosuvastatin  is ready for pick-up. Left VM with patient informing him of this. Also sent MyChart message.   Herlene Fleeta Morris, PharmD, JAQUELINE, CPP Clinical Pharmacist [facility] [phone]

## 2024-06-03 NOTE — Telephone Encounter (Signed)
 Call to patient unable to complete call or leave a message

## 2024-06-04 ENCOUNTER — Ambulatory Visit: Admitting: Family Medicine

## 2024-06-05 ENCOUNTER — Encounter (HOSPITAL_COMMUNITY)

## 2024-06-08 ENCOUNTER — Ambulatory Visit: Attending: Family Medicine | Admitting: Family Medicine

## 2024-06-08 ENCOUNTER — Encounter: Payer: Self-pay | Admitting: Family Medicine

## 2024-06-08 VITALS — BP 147/83 | HR 86 | Temp 98.7°F | Ht 64.0 in | Wt 164.2 lb

## 2024-06-08 DIAGNOSIS — I69954 Hemiplegia and hemiparesis following unspecified cerebrovascular disease affecting left non-dominant side: Secondary | ICD-10-CM

## 2024-06-08 DIAGNOSIS — E1169 Type 2 diabetes mellitus with other specified complication: Secondary | ICD-10-CM | POA: Diagnosis not present

## 2024-06-08 DIAGNOSIS — E1122 Type 2 diabetes mellitus with diabetic chronic kidney disease: Secondary | ICD-10-CM

## 2024-06-08 DIAGNOSIS — Z7984 Long term (current) use of oral hypoglycemic drugs: Secondary | ICD-10-CM

## 2024-06-08 DIAGNOSIS — K2100 Gastro-esophageal reflux disease with esophagitis, without bleeding: Secondary | ICD-10-CM

## 2024-06-08 DIAGNOSIS — Z87891 Personal history of nicotine dependence: Secondary | ICD-10-CM

## 2024-06-08 DIAGNOSIS — Z79899 Other long term (current) drug therapy: Secondary | ICD-10-CM

## 2024-06-08 DIAGNOSIS — I129 Hypertensive chronic kidney disease with stage 1 through stage 4 chronic kidney disease, or unspecified chronic kidney disease: Secondary | ICD-10-CM

## 2024-06-08 DIAGNOSIS — N184 Chronic kidney disease, stage 4 (severe): Secondary | ICD-10-CM

## 2024-06-08 LAB — POCT GLYCOSYLATED HEMOGLOBIN (HGB A1C): HbA1c, POC (controlled diabetic range): 6.5 % (ref 0.0–7.0)

## 2024-06-08 MED ORDER — OMEPRAZOLE 40 MG PO CPDR: 40.0000 mg | DELAYED_RELEASE_CAPSULE | Freq: Every day | ORAL | 1 refills | Status: AC

## 2024-06-08 MED ORDER — CARVEDILOL 25 MG PO TABS: 25.0000 mg | ORAL_TABLET | Freq: Two times a day (BID) | ORAL | 1 refills | Status: AC

## 2024-06-08 MED ORDER — FUROSEMIDE 80 MG PO TABS: 80.0000 mg | ORAL_TABLET | Freq: Every day | ORAL | 1 refills | Status: AC

## 2024-06-08 MED ORDER — HYDRALAZINE HCL 100 MG PO TABS: 100.0000 mg | ORAL_TABLET | Freq: Three times a day (TID) | ORAL | 1 refills | Status: AC

## 2024-06-08 MED ORDER — ROSUVASTATIN CALCIUM 40 MG PO TABS: 40.0000 mg | ORAL_TABLET | Freq: Every day | ORAL | 1 refills | Status: AC

## 2024-06-08 MED ORDER — GLIPIZIDE 5 MG PO TABS: 5.0000 mg | ORAL_TABLET | Freq: Two times a day (BID) | ORAL | 1 refills | Status: AC

## 2024-06-08 MED ORDER — ISOSORBIDE MONONITRATE ER 30 MG PO TB24: 30.0000 mg | ORAL_TABLET | Freq: Every day | ORAL | 1 refills | Status: AC

## 2024-06-08 MED ORDER — AMLODIPINE BESYLATE 10 MG PO TABS: 10.0000 mg | ORAL_TABLET | Freq: Every day | ORAL | 1 refills | Status: AC

## 2024-06-08 NOTE — Progress Notes (Signed)
 Subjective:  Patient ID: Tony Schmitt, male    DOB: 11/22/1970  Age: 53 y.o. MRN: [id number]  CC: Medical Management of Chronic Issues (Referral to PT)     Discussed the use of AI scribe software for clinical note transcription with the patient, who gave verbal consent to proceed.  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  He experiences weakness primarily on his left side, which led to his removal from the kidney transplant list after failing a physical examination.  He would like a referral to physical therapy so he can regain his strength.  He is currently under neurology care for management of his CKD. He has hypertension and missed his medication today, resulting in elevated blood pressure.  His diabetes is well-managed with an A1c of 6.5, and he takes glipizide  5 mg twice daily without hypoglycemic episodes. His sister usually provides transportation for medical appointments.    Past Medical History:  Diagnosis Date   Anemia    Chronic kidney disease    Diabetes mellitus without complication (HCC)    GERD (gastroesophageal reflux disease)    Hypertension    Sleep apnea    Stroke [facility])     Past Surgical History:  Procedure Laterality Date   AV FISTULA PLACEMENT Right 04/30/2023   Procedure: INSERTION OF RIGHT UPPER ARM GORETEX  GRAFT;  Surgeon: Eliza Lonni RAMAN, MD;  Location: [facility] OR;  Service: Vascular;  Laterality: Right;   BUBBLE STUDY  04/18/2020   Procedure: BUBBLE STUDY;  Surgeon: Shlomo Wilbert SAUNDERS, MD;  Location: [facility] ENDOSCOPY;  Service: Cardiovascular;;   TEE WITHOUT CARDIOVERSION N/A 04/18/2020   Procedure:  TRANSESOPHAGEAL ECHOCARDIOGRAM (TEE);  Surgeon: Shlomo Wilbert SAUNDERS, MD;  Location: [facility] ENDOSCOPY;  Service: Cardiovascular;  Laterality: N/A;    Family History  Problem Relation Age of Onset   Diabetes Mother    Kidney disease Mother    Diabetes Father    Hypertension Father    Cancer Father    Heart failure Other    Colon cancer Neg Hx    Colon polyps Neg Hx    Crohn's disease Neg Hx    Esophageal cancer Neg Hx    Rectal cancer Neg Hx    Stomach cancer Neg Hx    Ulcerative colitis Neg Hx     Social History   Socioeconomic History   Marital status: Single    Spouse name: Not on file   Number of children: Not on file   Years of education: Not on file   Highest education level: Associate degree: occupational, scientist, product/process development, or vocational program  Occupational History   Not on file  Tobacco Use   Smoking status: Former    Types: Cigarettes, Cigars   Smokeless tobacco: Never  Vaping Use   Vaping status: Never Used  Substance and Sexual Activity   Alcohol use: Not Currently   Drug use: Never   Sexual activity: Not Currently    Birth control/protection: Condom  Other Topics Concern   Not on file  Social History Narrative   Not on file   Social Drivers of Health   Financial Resource Strain: Low Risk  (06/05/2024)   Overall  Financial Resource Strain (CARDIA)    Difficulty of Paying Living Expenses: Not very hard  Food Insecurity: Food Insecurity Present (06/05/2024)   Hunger Vital Sign    Worried About Running Out of Food in the Last Year: Sometimes true    Ran Out of Food in the Last Year: Never true  Transportation Needs: No Transportation Needs (06/05/2024)   PRAPARE - Administrator, Civil Service (Medical): No    Lack of Transportation (Non-Medical): No  Physical Activity: Insufficiently Active (06/05/2024)   Exercise Vital Sign    Days of Exercise per Week: 2 days    Minutes of Exercise per Session: 20 min  Stress: No Stress Concern Present (06/05/2024)    [employer] of Occupational Health - Occupational Stress Questionnaire    Feeling of Stress: Only a little  Social Connections: Socially Isolated (06/05/2024)   Social Connection and Isolation Panel    Frequency of Communication with Friends and Family: Once a week    Frequency of Social Gatherings with Friends and Family: More than three times a week    Attends Religious Services: Patient declined    Database Administrator or Organizations: No    Attends Engineer, Structural: Not on file    Marital Status: Never married    Allergies  Allergen Reactions   Latex Itching    Outpatient Medications Prior to Visit  Medication Sig Dispense Refill   aspirin  81 MG chewable tablet Chew 1 tablet (81 mg total) by mouth daily. 30 tablet 0   cloNIDine (CATAPRES) 0.1 MG tablet Take 0.1 mg by mouth at bedtime.     Continuous Glucose Receiver (FREESTYLE LIBRE 3 READER) DEVI Place 1 sensor on the skin every 14 days. Use to check glucose continuously. E11.59 1 each 0   Continuous Glucose Sensor (FREESTYLE LIBRE 3 SENSOR) MISC Place 1 sensor on the skin every 14 days. Use to check glucose continuously. E11.59 2 each 6   epoetin  alfa-epbx (RETACRIT ) 20000 UNIT/ML injection Inject 20,000 Units into the vein every 14 (fourteen) days.     oxyCODONE  (OXY IR/ROXICODONE ) 5 MG immediate release tablet Take 1 tablet (5 mg total) by mouth every 6 (six) hours as needed for severe pain. 15 tablet 0   polyethylene glycol powder (GLYCOLAX /MIRALAX ) 17 GM/SCOOP powder Take 17 g by mouth daily. (Patient taking differently: Take 17 g by mouth daily as needed for mild constipation or moderate constipation.) 3350 g 1   sodium bicarbonate  650 MG tablet Take 1,300 mg by mouth 2 (two) times daily. TAKE 2 TABLETS 2 X A DAY     amLODipine  (NORVASC ) 10 MG tablet Take 1 tablet (10 mg total) by mouth daily. 90 tablet 0   carvedilol  (COREG ) 25 MG tablet TAKE 1 TABLET BY MOUTH TWICE DAILY WITH A MEAL 180 tablet 1    furosemide  (LASIX ) 80 MG tablet Take 1 tablet by mouth once daily 90 tablet 0   glipiZIDE  (GLUCOTROL ) 5 MG tablet TAKE 1 TABLET BY MOUTH TWICE DAILY BEFORE A MEAL 180 tablet 1   hydrALAZINE  (APRESOLINE ) 100 MG tablet TAKE 1 TABLET BY MOUTH THREE TIMES DAILY 270 tablet 0   isosorbide  mononitrate (IMDUR ) 30 MG 24 hr tablet Take 1 tablet by mouth once daily 90 tablet 1   omeprazole  (PRILOSEC) 40 MG capsule Take 1 capsule (40 mg total) by mouth daily. 90 capsule 0   rosuvastatin  (CRESTOR ) 40 MG tablet Take 1 tablet (40 mg total) by mouth daily. 90 tablet 0  No facility-administered medications prior to visit.     ROS Review of Systems  Constitutional:  Negative for activity change and appetite change.  HENT:  Negative for sinus pressure and sore throat.   Respiratory:  Negative for chest tightness, shortness of breath and wheezing.   Cardiovascular:  Negative for chest pain and palpitations.  Gastrointestinal:  Negative for abdominal distention, abdominal pain and constipation.  Genitourinary: Negative.   Musculoskeletal: Negative.   Neurological:  Positive for weakness.  Psychiatric/Behavioral:  Negative for behavioral problems and dysphoric mood.     Objective:  BP (!) 147/83   Pulse 86   Temp 98.7 F (37.1 C) (Oral)   Ht 5' 4 (1.626 m)   Wt 164 lb 3.2 oz (74.5 kg)   SpO2 100%   BMI 28.18 kg/m      06/08/2024    3:20 PM 06/08/2024    2:43 PM 04/10/2024    1:31 PM  BP/Weight  Systolic BP 147 176 162  Diastolic BP 83 76 70  Wt. (Lbs)  164.2   BMI  28.18 kg/m2       Physical Exam Constitutional:      Appearance: He is well-developed.  Cardiovascular:     Rate and Rhythm: Normal rate.     Heart sounds: Normal heart sounds. No murmur heard. Pulmonary:     Effort: Pulmonary effort is normal.     Breath sounds: Normal breath sounds. No wheezing or rales.  Chest:     Chest wall: No tenderness.  Abdominal:     General: Bowel sounds are normal. There is no distension.      Palpations: Abdomen is soft. There is no mass.     Tenderness: There is no abdominal tenderness.  Musculoskeletal:        General: Normal range of motion.     Right lower leg: No edema.     Left lower leg: No edema.  Neurological:     Mental Status: He is alert and oriented to person, place, and time.     Motor: Weakness (left hemiparesis) present.  Psychiatric:        Mood and Affect: Mood normal.        Latest Ref Rng & Units 03/27/2024    1:38 PM 02/28/2024    1:24 PM 01/31/2024    1:19 PM  CMP  Glucose 70 - 99 mg/dL 891  834  868   BUN 6 - 20 mg/dL 51  52  55   Creatinine 0.61 - 1.24 mg/dL 4.74  4.61  3.91   Sodium 135 - 145 mmol/L 138  136  138   Potassium 3.5 - 5.1 mmol/L 4.6  5.0  5.0   Chloride 98 - 111 mmol/L 112  104  109   CO2 22 - 32 mmol/L 17  21  20    Calcium  8.9 - 10.3 mg/dL 89.7  9.8  89.9     Lipid Panel     Component Value Date/Time   CHOL 212 (H) 01/11/2023 1110   TRIG 116 01/11/2023 1110   HDL 61 01/11/2023 1110   CHOLHDL 17.5 04/16/2020 0558   VLDL 60 (H) 04/16/2020 0558   LDLCALC 130 (H) 01/11/2023 1110    CBC    Component Value Date/Time   WBC 8.6 03/27/2024 1338   RBC 4.56 03/27/2024 1338   HGB 11.0 (L) 04/10/2024 1337   HGB 9.8 (L) 06/07/2020 1551   HCT 38.6 (L) 03/27/2024 1338   HCT 30.3 (L) 06/07/2020 1551  PLT 310 03/27/2024 1338   PLT 471 (H) 06/07/2020 1551   MCV 84.6 03/27/2024 1338   MCV 84 06/07/2020 1551   MCH 26.1 03/27/2024 1338   MCHC 30.8 03/27/2024 1338   RDW 15.7 (H) 03/27/2024 1338   RDW 14.1 06/07/2020 1551   LYMPHSABS 2.3 03/27/2024 1338   LYMPHSABS 1.8 06/07/2020 1551   MONOABS 0.5 03/27/2024 1338   EOSABS 0.3 03/27/2024 1338   EOSABS 0.2 06/07/2020 1551   BASOSABS 0.1 03/27/2024 1338   BASOSABS 0.1 06/07/2020 1551    Lab Results  Component Value Date   HGBA1C 6.5 06/08/2024    Lab Results  Component Value Date   HGBA1C 6.5 06/08/2024   HGBA1C 6.6 11/05/2023   HGBA1C 6.2 01/07/2023        Assessment & Plan Hypertension in chronic kidney disease stage 4 -BP improved to 147/83, current regimen effective. - Continue current antihypertensive medications. -Stage 4 CKD, not on transplant list due to weakness. - Referred to physical therapy for strength improvement to qualify for transplant list. - Coordinated with case manager for effective therapy program.  Spastic hemiplegia of left dominant side as a late result of CVA Persistent left-sided weakness affecting transplant eligibility. - Referred to physical therapy for targeted left-sided rehabilitation.  Type 2 diabetes mellitus with other specified complications Well-controlled, A1c 6.5, no hypoglycemia. - Continue current diabetes management regimen. - Refilled glipizide  prescription.  GERD - Controlled on PPI   General Health Maintenance Due for cholesterol test - Ordered fasting cholesterol test within a week.        Meds ordered this encounter  Medications   amLODipine  (NORVASC ) 10 MG tablet    Sig: Take 1 tablet (10 mg total) by mouth daily.    Dispense:  90 tablet    Refill:  1   furosemide  (LASIX ) 80 MG tablet    Sig: Take 1 tablet (80 mg total) by mouth daily.    Dispense:  90 tablet    Refill:  1   hydrALAZINE  (APRESOLINE ) 100 MG tablet    Sig: Take 1 tablet (100 mg total) by mouth 3 (three) times daily.    Dispense:  270 tablet    Refill:  1   omeprazole  (PRILOSEC) 40 MG capsule    Sig: Take 1 capsule (40 mg total) by mouth daily.    Dispense:  90 capsule    Refill:  1   rosuvastatin  (CRESTOR ) 40 MG tablet    Sig: Take 1 tablet (40 mg total) by mouth daily.    Dispense:  90 tablet    Refill:  1   carvedilol  (COREG ) 25 MG tablet    Sig: Take 1 tablet (25 mg total) by mouth 2 (two) times daily with a meal.    Dispense:  180 tablet    Refill:  1   glipiZIDE  (GLUCOTROL ) 5 MG tablet    Sig: Take 1 tablet (5 mg total) by mouth 2 (two) times daily before a meal.    Dispense:  180 tablet     Refill:  1   isosorbide  mononitrate (IMDUR ) 30 MG 24 hr tablet    Sig: Take 1 tablet (30 mg total) by mouth daily.    Dispense:  90 tablet    Refill:  1    Follow-up: Return in about 6 months (around 12/06/2024).       Corrina Sabin, MD, FAAFP. [facility] [place], [place] [phone]   06/08/2024, 5:43 PM

## 2024-06-08 NOTE — Patient Instructions (Signed)
 VISIT SUMMARY:  During your visit, we discussed your chronic kidney disease, left-sided weakness, diabetes, and hypertension. We also addressed your general health maintenance needs.  YOUR PLAN:  -CHRONIC KIDNEY DISEASE STAGE 4: You have stage 4 chronic kidney disease, which means your kidneys are significantly damaged and not functioning well. We referred you to physical therapy to help improve your strength so you can qualify for the transplant list again. We also coordinated with a case manager to ensure you have an effective therapy program.  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 We referred you to physical therapy for targeted rehabilitation to improve your left-sided strength.  -TYPE 2 DIABETES MELLITUS: Your type 2 diabetes is well-controlled with an A1c of 6.5, which is a measure of your average blood sugar levels over the past three months. Continue taking your current diabetes medication, glipizide , as prescribed. We have refilled your prescription.  -HYPERTENSION: Your blood pressure is currently 147/83, which is being managed effectively with your current medications. Continue taking your antihypertensive medications as prescribed.  -GENERAL HEALTH MAINTENANCE: You are due for a cholesterol test, and we have ordered a fasting cholesterol test to be done within a week. You also received your flu shot today. We have coordinated transportation for your blood test.  INSTRUCTIONS:  Please follow up with physical therapy as scheduled to improve your strength and left-sided weakness. Continue taking your medications for diabetes and hypertension as prescribed. Complete the fasting cholesterol test within a week. If you have any questions or concerns, please contact our office.

## 2024-06-10 ENCOUNTER — Other Ambulatory Visit: Payer: Self-pay | Admitting: Pharmacist

## 2024-06-10 NOTE — Progress Notes (Signed)
 Pharmacy Quality Measure Review  This patient is appearing on a report for being the adherence measure for cholesterol (statin) medications this calendar year.   Medication: rosuvastatin  Last fill date: 06/04/2024 for 90 day supply  Collaborated with pharmacy to fill last week. Patient has picked up his 90-day fill. Of note, he has no refills on that rxn. New rxn sent for him to have on file for when needed.   Herlene Fleeta Morris, PharmD, JAQUELINE, CPP Clinical Pharmacist [facility] [phone]

## 2024-06-17 ENCOUNTER — Ambulatory Visit

## 2024-06-17 ENCOUNTER — Other Ambulatory Visit

## 2024-06-18 ENCOUNTER — Other Ambulatory Visit

## 2024-06-18 ENCOUNTER — Ambulatory Visit: Attending: Family Medicine

## 2024-06-18 DIAGNOSIS — E1169 Type 2 diabetes mellitus with other specified complication: Secondary | ICD-10-CM

## 2024-06-19 ENCOUNTER — Ambulatory Visit: Payer: Self-pay | Admitting: Family Medicine

## 2024-06-20 LAB — CMP14+EGFR
ALT: 41 IU/L (ref 0–44)
AST: 20 IU/L (ref 0–40)
Albumin: 4.6 g/dL (ref 3.8–4.9)
Alkaline Phosphatase: 89 IU/L (ref 47–123)
BUN/Creatinine Ratio: 8 — ABNORMAL LOW (ref 9–20)
BUN: 47 mg/dL — ABNORMAL HIGH (ref 6–24)
Bilirubin Total: 0.4 mg/dL (ref 0.0–1.2)
CO2: 17 mmol/L — ABNORMAL LOW (ref 20–29)
Calcium: 10.4 mg/dL — ABNORMAL HIGH (ref 8.7–10.2)
Chloride: 107 mmol/L — ABNORMAL HIGH (ref 96–106)
Creatinine, Ser: 5.8 mg/dL — ABNORMAL HIGH (ref 0.76–1.27)
Globulin, Total: 2.8 g/dL (ref 1.5–4.5)
Glucose: 59 mg/dL — ABNORMAL LOW (ref 70–99)
Potassium: 4.8 mmol/L (ref 3.5–5.2)
Sodium: 140 mmol/L (ref 134–144)
Total Protein: 7.4 g/dL (ref 6.0–8.5)
eGFR: 11 mL/min/1.73 — ABNORMAL LOW (ref >59)

## 2024-06-20 LAB — FRUCTOSAMINE: Fructosamine: 361 umol/L — ABNORMAL HIGH (ref 0–285)

## 2024-06-20 LAB — MICROALBUMIN / CREATININE URINE RATIO
Creatinine, Urine: 83.4 mg/dL
Microalb/Creat Ratio: 2801 mg/g{creat} — ABNORMAL HIGH (ref 0–29)
Microalbumin, Urine: 2336.3 ug/mL

## 2024-06-20 LAB — LP+NON-HDL CHOLESTEROL
Cholesterol, Total: 143 mg/dL (ref 100–199)
HDL: 54 mg/dL (ref >39)
LDL Chol Calc (NIH): 72 mg/dL (ref 0–99)
Total Non-HDL-Chol (LDL+VLDL): 89 mg/dL (ref 0–129)
Triglycerides: 90 mg/dL (ref 0–149)
VLDL Cholesterol Cal: 17 mg/dL (ref 5–40)

## 2024-06-25 ENCOUNTER — Encounter: Payer: Self-pay | Admitting: Physical Therapy

## 2024-06-25 ENCOUNTER — Ambulatory Visit: Attending: Family Medicine | Admitting: Physical Therapy

## 2024-06-25 VITALS — BP 153/65 | HR 75

## 2024-06-25 DIAGNOSIS — R2681 Unsteadiness on feet: Secondary | ICD-10-CM | POA: Insufficient documentation

## 2024-06-25 DIAGNOSIS — M6281 Muscle weakness (generalized): Secondary | ICD-10-CM | POA: Diagnosis present

## 2024-06-25 DIAGNOSIS — R2689 Other abnormalities of gait and mobility: Secondary | ICD-10-CM | POA: Insufficient documentation

## 2024-06-25 DIAGNOSIS — I69354 Hemiplegia and hemiparesis following cerebral infarction affecting left non-dominant side: Secondary | ICD-10-CM | POA: Diagnosis present

## 2024-06-25 DIAGNOSIS — I69954 Hemiplegia and hemiparesis following unspecified cerebrovascular disease affecting left non-dominant side: Secondary | ICD-10-CM | POA: Diagnosis not present

## 2024-06-25 NOTE — Therapy (Signed)
 OUTPATIENT PHYSICAL THERAPY NEURO EVALUATION   Patient Name: Tony Schmitt MRN: [id number] DOB:Oct 23, 1970, 53 y.o., male Today's Date: 06/25/2024   PCP: Delbert Clam, MD REFERRING PROVIDER: Delbert Clam, MD  END OF SESSION:  PT End of Session - 06/25/24 1354     Visit Number 1    Number of Visits 5   Plus eval   Date for Recertification  07/30/24    Authorization Type [employer] Dual    PT Start Time 1351    PT Stop Time 1429    PT Time Calculation (min) 38 min    Activity Tolerance Patient tolerated treatment well    Behavior During Therapy Flat affect          Past Medical History:  Diagnosis Date   Anemia    Chronic kidney disease    Diabetes mellitus without complication (HCC)    GERD (gastroesophageal reflux disease)    Hypertension    Sleep apnea    Stroke [facility])    Past Surgical History:  Procedure Laterality Date   AV FISTULA PLACEMENT Right 04/30/2023   Procedure: INSERTION OF RIGHT UPPER ARM GORETEX  GRAFT;  Surgeon: Eliza Lonni RAMAN, MD;  Location: [facility] OR;  Service: Vascular;  Laterality: Right;   BUBBLE STUDY  04/18/2020   Procedure: BUBBLE STUDY;  Surgeon: Shlomo Wilbert SAUNDERS, MD;  Location: [facility] ENDOSCOPY;  Service: Cardiovascular;;   TEE WITHOUT CARDIOVERSION N/A 04/18/2020   Procedure: TRANSESOPHAGEAL ECHOCARDIOGRAM (TEE);  Surgeon: Shlomo Wilbert SAUNDERS, MD;  Location: [facility] ENDOSCOPY;  Service: Cardiovascular;  Laterality: N/A;   Patient Active Problem List   Diagnosis Date Noted   Anemia due to stage 5 chronic kidney disease treated with erythropoietin  (HCC) 04/17/2024   Spastic hemiplegia, unspecified etiology, unspecified laterality (HCC) 01/07/2023   Acquired left foot drop 05/22/2022   Nephrotic syndrome    Peripheral edema    Abnormality of gait 05/23/2020   Type 2 diabetes mellitus with hyperglycemia, without long-term current use of insulin  (HCC)    Thrombocytosis    Benign essential HTN    Acute blood loss anemia    Leukocytosis    Slow transit  constipation    AKI (acute kidney injury)    Diabetes mellitus type 2 with complications, uncontrolled    CVA (cerebral vascular accident) (HCC) 04/22/2020   Acute left-sided weakness    Tobacco abuse    Essential hypertension    Acute CVA (cerebrovascular accident) (HCC) 04/16/2020   Hypertensive urgency 04/16/2020   Type 2 diabetes mellitus with vascular disease (HCC) 04/16/2020   CKD (chronic kidney disease) stage 3, GFR 30-59 ml/min (HCC) 04/23/2019   Hyperlipidemia 04/18/2019   Dyslipidemia associated with type 2 diabetes mellitus (HCC) 08/29/2018   Hypertension associated with type 2 diabetes mellitus (HCC) 08/29/2018    ONSET DATE: 06/08/2024 (referral)   REFERRING DIAG: G81.10 (ICD-10-CM) - Spastic hemiplegia, unspecified etiology, unspecified laterality (HCC)  THERAPY DIAG:  Muscle weakness (generalized)  Other abnormalities of gait and mobility  Unsteadiness on feet  Hemiplegia and hemiparesis following cerebral infarction affecting left non-dominant side (HCC)  Rationale for Evaluation and Treatment: Rehabilitation  SUBJECTIVE:  SUBJECTIVE STATEMENT: Pt presents w/SPC. States he has returned to PT because he failed the test. Unable to elaborate on this, but did state he was taken off the transplant list due to leg weakness. Not wearing AFO on LLE, states he wears it off and on. Later states he does not wear it much because I don't feel like it. States he has not been exercising because he does not feel like it. No falls. Is very sedentary at home, watches TikToks and is on social media most of the day.   Did not check his blood sugar today.    Pt accompanied by: self  PERTINENT HISTORY: hypertension, stage IV CKD, type 2 diabetes mellitus, acute/subacute nonhemorrhagic  infarct in the posterior right corona radiata and anterior aspect of left internal capsule with residual left hemiparesis  PAIN:  Are you having pain? No  PRECAUTIONS: Fall  RED FLAGS: None   WEIGHT BEARING RESTRICTIONS: No  FALLS: Has patient fallen in last 6 months? No  LIVING ENVIRONMENT: Lives with: lives with their family Lives in: House/apartment Stairs: Yes: External: 3 steps; on right going up, on left going up, and can reach both Has following equipment at home: Single point cane, Walker - 4 wheeled, Wheelchair (manual), shower chair, Grab bars, and AFO  PLOF: Requires assistive device for independence, Needs assistance with ADLs, and Needs assistance with homemaking  PATIENT GOALS: I gotta get my hand   OBJECTIVE:  Note: Objective measures were completed at Evaluation unless otherwise noted.  DIAGNOSTIC FINDINGS: MRI of brain from 04/16/2020 IMPRESSION: 1. Acute/subacute nonhemorrhagic infarcts involving the posterior right corona radiata and anterior aspect of the left external capsule, corresponding with patient's symptoms. Right-sided infarcts likely impacts cortical spinal tracts involving left motor function. 2. Punctate cortical infarct along the inferior left frontal operculum. 3. Periventricular and subcortical T2 signal changes are otherwise mildly advanced for age. This likely reflects the sequela of chronic microvascular ischemia. 4. No flow signal in the distal left vertebral artery above the craniocervical junction. 5. Moderate to high-grade stenosis at the junction at the right V3 and V4 segments with segmental narrowing of the V4 segment above this level. 6. Moderate proximal basilar artery stenosis. 7. Atherosclerotic irregularity in the cavernous internal carotid arteries bilaterally with a 50-60% stenosis in the supraclinoid right ICA. 8. Moderate stenoses of the right posterior communicating artery with asymmetric attenuation of distal PCA  branch vessels on the right. The right posterior cerebral artery is of fetal type. 9. No significant proximal stenosis in the neck.  COGNITION: Overall cognitive status: Impaired   SENSATION: Pt denies numbness/tingling in all extremities    MUSCLE TONE: LLE: Hypotonic   POSTURE: rounded shoulders, forward head, and increased thoracic kyphosis  LOWER EXTREMITY ROM:     Active  Right Eval Left Eval  Hip flexion    Hip extension    Hip abduction    Hip adduction    Hip internal rotation    Hip external rotation    Knee flexion    Knee extension    Ankle dorsiflexion    Ankle plantarflexion    Ankle inversion    Ankle eversion     (Blank rows = not tested)  LOWER EXTREMITY MMT:  Tested in seated position   MMT Right Eval Left Eval  Hip flexion 4 2-  Hip extension    Hip abduction 4+ 3+  Hip adduction 4+ 3+  Hip internal rotation    Hip external rotation    Knee  flexion 4+ 2-  Knee extension 4+ 3+  Ankle dorsiflexion 4+ 1  Ankle plantarflexion    Ankle inversion    Ankle eversion    (Blank rows = not tested)  BED MOBILITY:  Not tested Pt reports independence w/this, sleeps in a lift chair   TRANSFERS: Sit to stand: Modified independence  Assistive device utilized: None     Stand to sit: Modified independence  Assistive device utilized: None      RAMP:  Not tested  CURB:  Not tested  STAIRS: Not tested GAIT: Gait pattern: step through pattern, decreased arm swing- Left, decreased step length- Left, decreased stride length, decreased hip/knee flexion- Left, decreased ankle dorsiflexion- Left, circumduction- Left, lateral hip instability, lateral lean- Right, decreased trunk rotation, wide BOS, and poor foot clearance- Left Distance walked: various short clinic distances  Assistive device utilized: Single point cane Level of assistance: Modified independence Comments: Pt not wearing AFO today and demonstrates circumduction w/hip ER to perform swing  phase on LLE.   FUNCTIONAL TESTS:   [facility] PT Assessment - 06/25/24 1418       Transfers   Five time sit to stand comments  14.56s   no UE support     Ambulation/Gait   Gait velocity 32.8' over 18s = 1.82 ft/s w/SPC   no AFO          VITALS  Vitals:   06/25/24 1408  BP: (!) 153/65  Pulse: 75                                                                                                                                TREATMENT :   Self-care/home management  Unice discussion regarding importance of exercising daily to see improvements w/strength and mobility. Educated pt on chronicity of L hemiparesis and how he has a history of noncompliance w/therapy. Informed pt he is to start a walking program (see HEP below) and work on his exercises at home. Informed pt he needs to either wear or bring his L AFO to therapy appointments and must check his blood sugar prior to PT sessions. Discussed plan to schedule 4 PT visits and if pt is compliant w/working on exercises at home, can add more. Pt verbalized understanding.   PATIENT EDUCATION: Education details: PT POC, eval findings, walking program, review of HEP, see self-care above  Person educated: Patient Education method: Explanation, Demonstration, and Handouts Education comprehension: verbalized understanding and needs further education  HOME EXERCISE PROGRAM: From previous POC: [id number] You Can Walk For A Certain Length Of Time Each Day                          Walk 5 minutes 2 times per day.             Increase 2  minutes every 7 days              Work  up to 15 minutes (1-2 times per day).              GOALS: Goals reviewed with patient? Yes  STG = LTG DUE TO POC LENGTH    LONG TERM GOALS: Target date: 07/30/2024   Pt will be independent and compliant with HEP and walking program for improved strength, balance, transfers and gait. Baseline: established on eval  Goal status: INITIAL  2.  Pt will improve gait  velocity to at least 2.0 ft/s w/LRAD for improved gait efficiency and reduced fall risk   Baseline: 1.82 ft/s w/SPC and no AFO Goal status: INITIAL   ASSESSMENT:  CLINICAL IMPRESSION: Patient is a 53 year old male referred to Neuro OPPT for spastic hemiplegia.   Pt's PMH is significant for: hypertension, stage IV CKD, type 2 diabetes mellitus ,acute/subacute nonhemorrhagic infarct in the posterior right corona radiata and anterior aspect of left internal capsule with residual left hemiparesis. The following deficits were present during the exam: decreased functional strength, decreased endurance, L hemiparesis, improper gait mechanics and decreased safety awareness. Pt received PT earlier this year in which he obtained a L AFO, but has not been wearing or using this and reports noncompliance w/HEP due to not feeling like it. Based on gait speed and L hemiplegia, pt is an incr risk for falls. Pt informed that he must work on his exercises and walking at home in order to see strength gains, as coming to PT 1x/week is not enough. Pt would benefit from skilled PT to address these impairments and functional limitations to maximize functional mobility independence.    OBJECTIVE IMPAIRMENTS: Abnormal gait, decreased activity tolerance, decreased balance, decreased cognition, decreased endurance, decreased knowledge of use of DME, decreased mobility, difficulty walking, decreased strength, impaired perceived functional ability, impaired tone, impaired UE functional use, improper body mechanics, and postural dysfunction  ACTIVITY LIMITATIONS: carrying, lifting, standing, squatting, stairs, transfers, bed mobility, hygiene/grooming, locomotion level, and caring for others  PARTICIPATION LIMITATIONS: meal prep, cleaning, laundry, medication management, interpersonal relationship, driving, shopping, community activity, occupation, and yard work  PERSONAL FACTORS: Behavior pattern, Fitness, Past/current  experiences, and 3+ comorbidities: stage IV CKD, HTN, DM II and L hemiparesis are also affecting patient's functional outcome.   REHAB POTENTIAL: Fair due to history of noncompliance and lack of motivation  CLINICAL DECISION MAKING: Evolving/moderate complexity  EVALUATION COMPLEXITY: Moderate  PLAN:  PT FREQUENCY: 1x/week  PT DURATION: 4 weeks  PLANNED INTERVENTIONS: [id number]- PT Re-evaluation, 97750- Physical Performance Testing, 97110-Therapeutic exercises, 97530- Therapeutic activity, [id number]- Neuromuscular re-education, 97535- Self Care, [id number]- Manual therapy, [id number]- Gait training, [phone]- Orthotic Initial, [phone]- Orthotic/Prosthetic subsequent, [phone]- Electrical stimulation (manual), [phone] (1-2 muscles), [id number] (3+ muscles)- Dry Needling, Patient/Family education, Balance training, Stair training, Joint mobilization, Spinal mobilization, Vestibular training, and DME instructions  PLAN FOR NEXT SESSION: Check vitals. Did he check blood sugar? Did he wear or bring AFO? Is he doing walking program? Work on endurance, review HEP from previous POC. LLE NMR    Tony Schmitt, PT, DPT 06/25/2024, 2:31 PM

## 2024-07-06 ENCOUNTER — Ambulatory Visit: Admitting: Family Medicine

## 2024-07-09 ENCOUNTER — Ambulatory Visit: Admitting: Physical Therapy

## 2024-07-09 LAB — LAB REPORT - SCANNED: EGFR: 13

## 2024-07-16 ENCOUNTER — Ambulatory Visit: Attending: Family Medicine | Admitting: Physical Therapy

## 2024-07-16 DIAGNOSIS — R2681 Unsteadiness on feet: Secondary | ICD-10-CM | POA: Diagnosis present

## 2024-07-16 DIAGNOSIS — M6281 Muscle weakness (generalized): Secondary | ICD-10-CM | POA: Diagnosis present

## 2024-07-16 DIAGNOSIS — R2689 Other abnormalities of gait and mobility: Secondary | ICD-10-CM

## 2024-07-16 DIAGNOSIS — I69354 Hemiplegia and hemiparesis following cerebral infarction affecting left non-dominant side: Secondary | ICD-10-CM | POA: Diagnosis present

## 2024-07-16 NOTE — Therapy (Signed)
 OUTPATIENT PHYSICAL THERAPY NEURO TREATMENT   Patient Name: Tony Schmitt MRN: [id number] DOB:03-07-1971, 53 y.o., male Today's Date: 07/16/2024   PCP: Delbert Clam, MD REFERRING PROVIDER: Delbert Clam, MD  END OF SESSION:  PT End of Session - 07/16/24 1538     Visit Number 2    Number of Visits 5   Plus eval   Date for Recertification  07/30/24    Authorization Type U[employer] Dual    PT Start Time 1536    PT Stop Time 1616    PT Time Calculation (min) 40 min    Equipment Utilized During Treatment Other (comment);Gait belt   R AFO   Activity Tolerance Patient tolerated treatment well    Behavior During Therapy Flat affect          Past Medical History:  Diagnosis Date   Anemia    Chronic kidney disease    Diabetes mellitus without complication (HCC)    GERD (gastroesophageal reflux disease)    Hypertension    Sleep apnea    Stroke [facility])    Past Surgical History:  Procedure Laterality Date   AV FISTULA PLACEMENT Right 04/30/2023   Procedure: INSERTION OF RIGHT UPPER ARM GORETEX  GRAFT;  Surgeon: Eliza Lonni RAMAN, MD;  Location: [facility];  Service: Vascular;  Laterality: Right;   BUBBLE STUDY  04/18/2020   Procedure: BUBBLE STUDY;  Surgeon: Shlomo Wilbert SAUNDERS, MD;  Location: [facility] ENDOSCOPY;  Service: Cardiovascular;;   TEE WITHOUT CARDIOVERSION N/A 04/18/2020   Procedure: TRANSESOPHAGEAL ECHOCARDIOGRAM (TEE);  Surgeon: Shlomo Wilbert SAUNDERS, MD;  Location: [facility] ENDOSCOPY;  Service: Cardiovascular;  Laterality: N/A;   Patient Active Problem List   Diagnosis Date Noted   Anemia due to stage 5 chronic kidney disease treated with erythropoietin  (HCC) 04/17/2024   Spastic hemiplegia, unspecified etiology, unspecified laterality (HCC) 01/07/2023   Acquired left foot drop 05/22/2022   Nephrotic syndrome    Peripheral edema    Abnormality of gait 05/23/2020   Type 2 diabetes mellitus with hyperglycemia, without long-term current use of insulin  (HCC)    Thrombocytosis    Benign  essential HTN    Acute blood loss anemia    Leukocytosis    Slow transit constipation    AKI (acute kidney injury)    Diabetes mellitus type 2 with complications, uncontrolled    CVA (cerebral vascular accident) (HCC) 04/22/2020   Acute left-sided weakness    Tobacco abuse    Essential hypertension    Acute CVA (cerebrovascular accident) (HCC) 04/16/2020   Hypertensive urgency 04/16/2020   Type 2 diabetes mellitus with vascular disease (HCC) 04/16/2020   CKD (chronic kidney disease) stage 3, GFR 30-59 ml/min (HCC) 04/23/2019   Hyperlipidemia 04/18/2019   Dyslipidemia associated with type 2 diabetes mellitus (HCC) 08/29/2018   Hypertension associated with type 2 diabetes mellitus (HCC) 08/29/2018    ONSET DATE: 06/08/2024 (referral)   REFERRING DIAG: G81.10 (ICD-10-CM) - Spastic hemiplegia, unspecified etiology, unspecified laterality (HCC)  THERAPY DIAG:  Muscle weakness (generalized)  Other abnormalities of gait and mobility  Unsteadiness on feet  Hemiplegia and hemiparesis following cerebral infarction affecting left non-dominant side (HCC)  Rationale for Evaluation and Treatment: Rehabilitation  SUBJECTIVE:  SUBJECTIVE STATEMENT: Pt presents w/his sister, who is carrying pt's L AFO. Pt holding cane but not using it. Pt states his mother checked his blood glucose and BP prior to session (at 3:01) and provided this info on sheet of paper.   Sister reports pt went walking once at the mall and overdid it. Otherwise has not been working on exercises.   Blood glucose at 188 mg/dL, BP at 845/34 mmHg, HR 85 bpm.   Pt accompanied by: Darin Slough   PERTINENT HISTORY: hypertension, stage IV CKD, type 2 diabetes mellitus, acute/subacute nonhemorrhagic infarct in the posterior right corona  radiata and anterior aspect of left internal capsule with residual left hemiparesis  PAIN:  Are you having pain? No  PRECAUTIONS: Fall  RED FLAGS: None   WEIGHT BEARING RESTRICTIONS: No  FALLS: Has patient fallen in last 6 months? No  LIVING ENVIRONMENT: Lives with: lives with their family Lives in: House/apartment Stairs: Yes: External: 3 steps; on right going up, on left going up, and can reach both Has following equipment at home: Single point cane, Walker - 4 wheeled, Wheelchair (manual), shower chair, Grab bars, and AFO  PLOF: Requires assistive device for independence, Needs assistance with ADLs, and Needs assistance with homemaking  PATIENT GOALS: I gotta get my hand   OBJECTIVE:  Note: Objective measures were completed at Evaluation unless otherwise noted.  DIAGNOSTIC FINDINGS: MRI of brain from 04/16/2020 IMPRESSION: 1. Acute/subacute nonhemorrhagic infarcts involving the posterior right corona radiata and anterior aspect of the left external capsule, corresponding with patient's symptoms. Right-sided infarcts likely impacts cortical spinal tracts involving left motor function. 2. Punctate cortical infarct along the inferior left frontal operculum. 3. Periventricular and subcortical T2 signal changes are otherwise mildly advanced for age. This likely reflects the sequela of chronic microvascular ischemia. 4. No flow signal in the distal left vertebral artery above the craniocervical junction. 5. Moderate to high-grade stenosis at the junction at the right V3 and V4 segments with segmental narrowing of the V4 segment above this level. 6. Moderate proximal basilar artery stenosis. 7. Atherosclerotic irregularity in the cavernous internal carotid arteries bilaterally with a 50-60% stenosis in the supraclinoid right ICA. 8. Moderate stenoses of the right posterior communicating artery with asymmetric attenuation of distal PCA branch vessels on the right. The  right posterior cerebral artery is of fetal type. 9. No significant proximal stenosis in the neck.  COGNITION: Overall cognitive status: Impaired   SENSATION: Pt denies numbness/tingling in all extremities    MUSCLE TONE: LLE: Hypotonic   POSTURE: rounded shoulders, forward head, and increased thoracic kyphosis  LOWER EXTREMITY ROM:     Active  Right Eval Left Eval  Hip flexion    Hip extension    Hip abduction    Hip adduction    Hip internal rotation    Hip external rotation    Knee flexion    Knee extension    Ankle dorsiflexion    Ankle plantarflexion    Ankle inversion    Ankle eversion     (Blank rows = not tested)  LOWER EXTREMITY MMT:  Tested in seated position   MMT Right Eval Left Eval  Hip flexion 4 2-  Hip extension    Hip abduction 4+ 3+  Hip adduction 4+ 3+  Hip internal rotation    Hip external rotation    Knee flexion 4+ 2-  Knee extension 4+ 3+  Ankle dorsiflexion 4+ 1  Ankle plantarflexion    Ankle inversion  Ankle eversion    (Blank rows = not tested)  BED MOBILITY:  Not tested Pt reports independence w/this, sleeps in a lift chair   TRANSFERS: Sit to stand: Modified independence  Assistive device utilized: None     Stand to sit: Modified independence  Assistive device utilized: None      RAMP:  Not tested  CURB:  Not tested  STAIRS: Not tested GAIT: Gait pattern: step through pattern, decreased arm swing- Left, decreased step length- Left, decreased stride length, decreased hip/knee flexion- Left, decreased ankle dorsiflexion- Left, circumduction- Left, lateral hip instability, lateral lean- Right, decreased trunk rotation, wide BOS, and poor foot clearance- Left Distance walked: various short clinic distances  Assistive device utilized: Single point cane Level of assistance: Modified independence Comments: Pt not wearing AFO today and demonstrates circumduction w/hip ER to perform swing phase on LLE.   FUNCTIONAL  TESTS:      VITALS  There were no vitals filed for this visit.                                                                                                                               TREATMENT :   Self-care/home management/Gait training  Sister reports that pt's L shoe is too small to comfortably wear brace, so recommended pt obtain shoe that is 0.5-1 size larger than typical shoe size to accommodate for brace. Recommended [employer] as they accommodate AFOs well and informed pt and sister that he may go to [employer] to try on shoes with the brace. Also informed them that they can purchase a single shoe from [employer].   Gait pattern: Poor eccentric control of LLE w/IC, step through pattern, decreased arm swing- Left, decreased stride length, decreased hip/knee flexion- Left, decreased ankle dorsiflexion- Left, Left hip hike, lateral hip instability, lateral lean- Right, decreased trunk rotation, and wide BOS Distance walked: 115'  Assistive device utilized: L AFO Level of assistance: SBA Comments: Pt able to don AFO independently but required mod A to don L shoe. Pt demonstrates poor eccentric control of LLE w/IC and due to lack of PF in AFO, pt pitches forward w/IC and LR. Pt reports he often does not wear his brace because he feels more stable without it, so discussed contacting [employer] to see if brace can be hinged at the ankle to allow some PF for improved eccentric control w/IC. Provided visual demonstration of hinged brace and pt in agreement with this. Informed pt it is important for his AFO to be functional as it will facilitate increased weightbearing on LLE for improved strength. Without brace, pt does not place much weight on LLE.   Inquired about requirements for pt to be on transplant list and pt unsure, so sister called pt's mother and mother reports she is also not sure but pt was supposed to be referred to [facility] for second opinion and never heard from [place]. Therapist looked into  referral and noted pt  has been a no-show for his Retacrit  injections since October and this has affected referral. Pt's sister reports that pt's mother is now undergoing her own medical treatment and can no longer transport pt and pt was to call and reschedule his Retacrit  injections. Pt acknowledges that he has not been proactive in attending his medical appointments or doing his therapy. Sister reports they will work on calling PCP to inquire about referral to [facility] and getting pt back on schedule for Retacrit  injections prior to next PT session.  Informed pt that he must be doing his HEP in order to see improvements in strength, as coming to PT session 1x/week is not going to make a large impact.  Pt reports he has not been doing any of his PT or OT exercises but will start. Recommended pt start light as he has not been performing them and then slowly increase load to avoid significant soreness/injury. Pt verbalized understanding.    PATIENT EDUCATION: Education details: Importance of doing exercise, importance of being proactive w/care, plan for AFO, where to obtain new shoes  Person educated: Patient and Sister  Education method: Explanation, Demonstration, and Verbal cues Education comprehension: verbalized understanding, returned demonstration, and needs further education  HOME EXERCISE PROGRAM: From previous POC: [id number] You Can Walk For A Certain Length Of Time Each Day                          Walk 5 minutes 2 times per day.             Increase 2  minutes every 7 days              Work up to 15 minutes (1-2 times per day).              GOALS: Goals reviewed with patient? Yes  STG = LTG DUE TO POC LENGTH    LONG TERM GOALS: Target date: 07/30/2024   Pt will be independent and compliant with HEP and walking program for improved strength, balance, transfers and gait. Baseline: established on eval  Goal status: INITIAL  2.  Pt will improve gait velocity to at least 2.0 ft/s  w/LRAD for improved gait efficiency and reduced fall risk   Baseline: 1.82 ft/s w/SPC and no AFO Goal status: INITIAL   ASSESSMENT:  CLINICAL IMPRESSION: Emphasis of skilled PT session on assessing gait w/L AFO and pt education. Pt reports non-compliance w/wearing AFO and noted poor eccentric control on LLE due to rigidity of brace, so will contact [employer] to see if brace can be adjusted to allow for more ankle mobility. Pt has not been compliant w/medical appointments and therapy and both sister and therapist informed pt he must be independent and consistent w/this in order to see strength gains and be a candidate for kidney transplant. Pt verbalized understanding. Continue POC.    OBJECTIVE IMPAIRMENTS: Abnormal gait, decreased activity tolerance, decreased balance, decreased cognition, decreased endurance, decreased knowledge of use of DME, decreased mobility, difficulty walking, decreased strength, impaired perceived functional ability, impaired tone, impaired UE functional use, improper body mechanics, and postural dysfunction  ACTIVITY LIMITATIONS: carrying, lifting, standing, squatting, stairs, transfers, bed mobility, hygiene/grooming, locomotion level, and caring for others  PARTICIPATION LIMITATIONS: meal prep, cleaning, laundry, medication management, interpersonal relationship, driving, shopping, community activity, occupation, and yard work  PERSONAL FACTORS: Behavior pattern, Fitness, Past/current experiences, and 3+ comorbidities: stage IV CKD, HTN, DM II and L hemiparesis are also affecting patient's functional  outcome.   REHAB POTENTIAL: Fair due to history of noncompliance and lack of motivation  CLINICAL DECISION MAKING: Evolving/moderate complexity  EVALUATION COMPLEXITY: Moderate  PLAN:  PT FREQUENCY: 1x/week  PT DURATION: 4 weeks  PLANNED INTERVENTIONS: 97164- PT Re-evaluation, 97750- Physical Performance Testing, 97110-Therapeutic exercises, 97530- Therapeutic  activity, [id number]- Neuromuscular re-education, 97535- Self Care, [id number]- Manual therapy, [id number]- Gait training, [phone]- Orthotic Initial, [phone]- Orthotic/Prosthetic subsequent, [phone]- Electrical stimulation (manual), [phone] (1-2 muscles), [id number] (3+ muscles)- Dry Needling, Patient/Family education, Balance training, Stair training, Joint mobilization, Spinal mobilization, Vestibular training, and DME instructions  PLAN FOR NEXT SESSION: Check vitals. Did he check blood sugar? Is he doing walking program? Did we contact [employer]? Did he hear about referral from [facility]? Work on endurance, review HEP from previous POC. LLE NMR.    Levan Aloia E Elea Holtzclaw, PT, DPT 07/16/2024, 4:29 PM

## 2024-07-22 ENCOUNTER — Encounter: Payer: Self-pay | Admitting: Physical Therapy

## 2024-07-22 NOTE — Therapy (Signed)
 [facility] [address] [place], [place], [address] Phone: [phone]   Fax:  [phone]  Patient Details  Name: Tony Schmitt MRN: [id number] Date of Birth: 06-14-1971 Referring Provider:  No ref. provider found  Encounter Date: 07/22/2024  PHYSICAL THERAPY DISCHARGE SUMMARY  Visits from Start of Care: 2  Current functional level related to goals / functional outcomes: See evaluation    Remaining deficits: L hemiparesis, increased fall risk    Education / Equipment: HEP (from previous POC)    Patient agrees to discharge. Patient goals were not met. Patient is being discharged due to the patient's request.Pt called to request to be DC.    Marlon BRAVO Cherolyn Behrle, PT, DPT 07/22/2024, 1:34 PM  [facility] [address] [place], [place], [address] Phone: [phone]   Fax:  [phone]

## 2024-07-23 ENCOUNTER — Ambulatory Visit: Admitting: Physical Therapy

## 2024-07-29 ENCOUNTER — Ambulatory Visit: Admitting: Physical Therapy

## 2024-08-03 ENCOUNTER — Encounter: Payer: Self-pay | Admitting: Podiatry

## 2024-08-03 ENCOUNTER — Ambulatory Visit: Admitting: Podiatry

## 2024-08-03 VITALS — Ht 64.0 in | Wt 164.2 lb

## 2024-08-03 DIAGNOSIS — E1165 Type 2 diabetes mellitus with hyperglycemia: Secondary | ICD-10-CM

## 2024-08-03 DIAGNOSIS — B351 Tinea unguium: Secondary | ICD-10-CM | POA: Diagnosis not present

## 2024-08-03 NOTE — Progress Notes (Signed)
This patient returns to my office for at risk foot care.  This patient requires this care by a professional since this patient will be at risk due to having CKD and diabetes and CVA..  This patient is unable to cut nails himself since the patient cannot reach his nails.These nails are painful walking and wearing shoes.  This patient presents for at risk foot care today.  General Appearance  Alert, conversant and in no acute stress.  Vascular  Dorsalis pedis and posterior tibial  pulses are weakly palpable  bilaterally.  Capillary return is within normal limits  bilaterally. Temperature is within normal limits  bilaterally.  Neurologic  Senn-Weinstein monofilament wire test within normal limits  bilaterally. Muscle power within normal limits bilaterally.  Nails Thick disfigured discolored nails with subungual debris  from hallux to fifth toes bilaterally. No evidence of bacterial infection or drainage bilaterally.  Orthopedic  No limitations of motion  feet .  No crepitus or effusions noted.  No bony pathology or digital deformities noted.  Skin  dry scaly skin with no porokeratosis noted bilaterally.  No signs of infections or ulcers noted.     Onychomycosis  Pain in right toes  Pain in left toes  Consent was obtained for treatment procedures.   Mechanical debridement of nails 1-5  bilaterally performed with a nail nipper.  Filed with dremel without incident.    Return office visit   3 months                   Told patient to return for periodic foot care and evaluation due to potential at risk complications.   Helane Gunther DPM

## 2024-08-10 ENCOUNTER — Telehealth (HOSPITAL_COMMUNITY): Payer: Self-pay | Admitting: Pharmacy Technician

## 2024-08-10 NOTE — Telephone Encounter (Signed)
 Auth Submission: NO AUTH NEEDED Site of care: [facility] Payer: [employer] DUAL Medication & CPT/J Code(s) submitted: [id number] - RETACRIT  NON-ESRD  Diagnosis Code: N18.5, D63.1 Route of submission (phone, fax, portal):  Phone # Fax # Auth type: Buy/Bill HB Units/visits requested: 20000U q 28 days Reference number: [id number] Approval from: 08/06/24 to 01/25/25    Dagoberto Armour, CPhT [facility] Phone: [phone] 08/10/2024

## 2024-08-21 ENCOUNTER — Other Ambulatory Visit (HOSPITAL_COMMUNITY): Payer: Self-pay | Admitting: Nephrology

## 2024-08-22 LAB — LAB REPORT - SCANNED: PTH: 215

## 2024-08-31 ENCOUNTER — Encounter (HOSPITAL_COMMUNITY)

## 2024-09-04 ENCOUNTER — Other Ambulatory Visit: Payer: Self-pay | Admitting: Family Medicine

## 2024-09-04 DIAGNOSIS — K2100 Gastro-esophageal reflux disease with esophagitis, without bleeding: Secondary | ICD-10-CM

## 2024-09-08 ENCOUNTER — Inpatient Hospital Stay (HOSPITAL_COMMUNITY)
Admission: RE | Admit: 2024-09-08 | Discharge: 2024-09-08 | Disposition: A | Source: Ambulatory Visit | Attending: Nephrology

## 2024-09-08 VITALS — BP 143/67 | HR 76 | Temp 97.6°F | Resp 14

## 2024-09-08 DIAGNOSIS — D631 Anemia in chronic kidney disease: Secondary | ICD-10-CM

## 2024-09-08 LAB — POCT HEMOGLOBIN-HEMACUE: Hemoglobin: 9.9 g/dL — ABNORMAL LOW (ref 13.0–17.0)

## 2024-09-08 MED ORDER — EPOETIN ALFA-EPBX 10000 UNIT/ML IJ SOLN
INTRAMUSCULAR | Status: AC
  Filled 2024-09-08: qty 1

## 2024-09-08 MED ORDER — EPOETIN ALFA-EPBX 10000 UNIT/ML IJ SOLN
10000.0000 [IU] | Freq: Once | INTRAMUSCULAR | Status: AC
  Administered 2024-09-08: 10000 [IU] via SUBCUTANEOUS

## 2024-10-06 ENCOUNTER — Encounter (HOSPITAL_COMMUNITY)

## 2024-10-09 ENCOUNTER — Encounter (HOSPITAL_COMMUNITY)

## 2024-11-04 ENCOUNTER — Ambulatory Visit: Admitting: Podiatry

## 2024-12-07 ENCOUNTER — Ambulatory Visit: Admitting: Family Medicine
